# Patient Record
Sex: Female | Born: 1935 | Race: White | Hispanic: No | Smoking: Former smoker
Health system: Southern US, Community
[De-identification: ages and names within clinical notes are randomized; demographics above are authoritative.]

## PROBLEM LIST (undated history)

## (undated) DIAGNOSIS — I2510 Atherosclerotic heart disease of native coronary artery without angina pectoris: Secondary | ICD-10-CM

## (undated) DIAGNOSIS — M1990 Unspecified osteoarthritis, unspecified site: Secondary | ICD-10-CM

## (undated) DIAGNOSIS — I482 Chronic atrial fibrillation, unspecified: Secondary | ICD-10-CM

## (undated) DIAGNOSIS — I272 Pulmonary hypertension, unspecified: Secondary | ICD-10-CM

## (undated) DIAGNOSIS — K219 Gastro-esophageal reflux disease without esophagitis: Secondary | ICD-10-CM

## (undated) DIAGNOSIS — K449 Diaphragmatic hernia without obstruction or gangrene: Secondary | ICD-10-CM

## (undated) DIAGNOSIS — S82899A Other fracture of unspecified lower leg, initial encounter for closed fracture: Secondary | ICD-10-CM

## (undated) DIAGNOSIS — I340 Nonrheumatic mitral (valve) insufficiency: Secondary | ICD-10-CM

## (undated) DIAGNOSIS — I10 Essential (primary) hypertension: Secondary | ICD-10-CM

## (undated) DIAGNOSIS — R9431 Abnormal electrocardiogram [ECG] [EKG]: Secondary | ICD-10-CM

## (undated) DIAGNOSIS — I499 Cardiac arrhythmia, unspecified: Secondary | ICD-10-CM

## (undated) DIAGNOSIS — I071 Rheumatic tricuspid insufficiency: Secondary | ICD-10-CM

## (undated) DIAGNOSIS — F419 Anxiety disorder, unspecified: Secondary | ICD-10-CM

## (undated) DIAGNOSIS — Z9289 Personal history of other medical treatment: Secondary | ICD-10-CM

## (undated) DIAGNOSIS — E785 Hyperlipidemia, unspecified: Secondary | ICD-10-CM

## (undated) DIAGNOSIS — F0390 Unspecified dementia without behavioral disturbance: Secondary | ICD-10-CM

## (undated) DIAGNOSIS — N390 Urinary tract infection, site not specified: Secondary | ICD-10-CM

## (undated) DIAGNOSIS — K2270 Barrett's esophagus without dysplasia: Secondary | ICD-10-CM

## (undated) DIAGNOSIS — E669 Obesity, unspecified: Secondary | ICD-10-CM

## (undated) HISTORY — PX: CORONARY ANGIOPLASTY: SHX604

## (undated) HISTORY — DX: Obesity, unspecified: E66.9

## (undated) HISTORY — PX: BREAST EXCISIONAL BIOPSY: SUR124

## (undated) HISTORY — DX: Pulmonary hypertension, unspecified: I27.20

## (undated) HISTORY — DX: Rheumatic tricuspid insufficiency: I07.1

## (undated) HISTORY — PX: ABDOMINAL HYSTERECTOMY: SHX81

## (undated) HISTORY — DX: Abnormal electrocardiogram (ECG) (EKG): R94.31

## (undated) HISTORY — PX: ESOPHAGOGASTRODUODENOSCOPY: SHX1529

## (undated) HISTORY — DX: Personal history of other medical treatment: Z92.89

## (undated) HISTORY — DX: Diaphragmatic hernia without obstruction or gangrene: K44.9

## (undated) HISTORY — DX: Hyperlipidemia, unspecified: E78.5

## (undated) HISTORY — DX: Nonrheumatic mitral (valve) insufficiency: I34.0

## (undated) HISTORY — DX: Chronic atrial fibrillation, unspecified: I48.20

---

## 1968-02-18 HISTORY — PX: BREAST EXCISIONAL BIOPSY: SUR124

## 1999-02-18 HISTORY — PX: ANKLE SURGERY: SHX546

## 1999-09-09 ENCOUNTER — Encounter: Admission: RE | Admit: 1999-09-09 | Discharge: 1999-09-09 | Payer: Self-pay | Admitting: Emergency Medicine

## 1999-09-09 ENCOUNTER — Encounter: Payer: Self-pay | Admitting: Emergency Medicine

## 2000-12-29 ENCOUNTER — Encounter: Admission: RE | Admit: 2000-12-29 | Discharge: 2000-12-29 | Payer: Self-pay | Admitting: Emergency Medicine

## 2000-12-29 ENCOUNTER — Encounter: Payer: Self-pay | Admitting: Emergency Medicine

## 2002-01-14 ENCOUNTER — Encounter: Admission: RE | Admit: 2002-01-14 | Discharge: 2002-01-14 | Payer: Self-pay | Admitting: Emergency Medicine

## 2002-01-14 ENCOUNTER — Encounter: Payer: Self-pay | Admitting: Emergency Medicine

## 2002-07-15 ENCOUNTER — Encounter: Payer: Self-pay | Admitting: Cardiology

## 2002-07-15 ENCOUNTER — Ambulatory Visit (HOSPITAL_COMMUNITY): Admission: RE | Admit: 2002-07-15 | Discharge: 2002-07-15 | Payer: Self-pay | Admitting: Emergency Medicine

## 2003-01-17 ENCOUNTER — Encounter: Admission: RE | Admit: 2003-01-17 | Discharge: 2003-01-17 | Payer: Self-pay | Admitting: Emergency Medicine

## 2003-03-25 ENCOUNTER — Emergency Department (HOSPITAL_COMMUNITY): Admission: EM | Admit: 2003-03-25 | Discharge: 2003-03-26 | Payer: Self-pay | Admitting: Emergency Medicine

## 2003-05-01 ENCOUNTER — Inpatient Hospital Stay (HOSPITAL_COMMUNITY): Admission: EM | Admit: 2003-05-01 | Discharge: 2003-05-04 | Payer: Self-pay

## 2003-05-02 ENCOUNTER — Encounter (INDEPENDENT_AMBULATORY_CARE_PROVIDER_SITE_OTHER): Payer: Self-pay | Admitting: *Deleted

## 2004-02-15 ENCOUNTER — Encounter: Admission: RE | Admit: 2004-02-15 | Discharge: 2004-02-15 | Payer: Self-pay | Admitting: Emergency Medicine

## 2004-03-23 ENCOUNTER — Ambulatory Visit: Payer: Self-pay | Admitting: Cardiovascular Disease

## 2004-03-24 ENCOUNTER — Inpatient Hospital Stay (HOSPITAL_COMMUNITY): Admission: EM | Admit: 2004-03-24 | Discharge: 2004-03-30 | Payer: Self-pay | Admitting: Emergency Medicine

## 2004-03-25 ENCOUNTER — Encounter: Payer: Self-pay | Admitting: Cardiovascular Disease

## 2004-04-08 ENCOUNTER — Ambulatory Visit: Payer: Self-pay | Admitting: *Deleted

## 2004-07-09 ENCOUNTER — Encounter: Admission: RE | Admit: 2004-07-09 | Discharge: 2004-07-09 | Payer: Self-pay | Admitting: Orthopedic Surgery

## 2004-07-10 ENCOUNTER — Ambulatory Visit (HOSPITAL_BASED_OUTPATIENT_CLINIC_OR_DEPARTMENT_OTHER): Admission: RE | Admit: 2004-07-10 | Discharge: 2004-07-10 | Payer: Self-pay | Admitting: Orthopedic Surgery

## 2004-07-10 ENCOUNTER — Ambulatory Visit (HOSPITAL_COMMUNITY): Admission: RE | Admit: 2004-07-10 | Discharge: 2004-07-10 | Payer: Self-pay | Admitting: Orthopedic Surgery

## 2004-07-10 ENCOUNTER — Encounter (INDEPENDENT_AMBULATORY_CARE_PROVIDER_SITE_OTHER): Payer: Self-pay | Admitting: Specialist

## 2004-10-22 ENCOUNTER — Ambulatory Visit: Payer: Self-pay | Admitting: Cardiovascular Disease

## 2005-03-12 ENCOUNTER — Encounter: Admission: RE | Admit: 2005-03-12 | Discharge: 2005-03-12 | Payer: Self-pay | Admitting: Emergency Medicine

## 2005-05-02 ENCOUNTER — Ambulatory Visit: Payer: Self-pay | Admitting: Cardiovascular Disease

## 2005-05-02 ENCOUNTER — Ambulatory Visit: Payer: Self-pay

## 2005-10-22 ENCOUNTER — Encounter: Payer: Self-pay | Admitting: Cardiovascular Disease

## 2005-10-22 ENCOUNTER — Ambulatory Visit: Payer: Self-pay | Admitting: Internal Medicine

## 2006-03-16 ENCOUNTER — Encounter: Admission: RE | Admit: 2006-03-16 | Discharge: 2006-03-16 | Payer: Self-pay | Admitting: Emergency Medicine

## 2006-05-20 ENCOUNTER — Ambulatory Visit: Payer: Self-pay | Admitting: Cardiovascular Disease

## 2006-06-03 ENCOUNTER — Ambulatory Visit: Payer: Self-pay

## 2006-06-03 LAB — CONVERTED CEMR LAB
ALT: 16 units/L (ref 0–40)
AST: 27 units/L (ref 0–37)
Albumin: 4 g/dL (ref 3.5–5.2)
Total Bilirubin: 0.8 mg/dL (ref 0.3–1.2)
VLDL: 16 mg/dL (ref 0–40)

## 2007-04-14 ENCOUNTER — Encounter: Admission: RE | Admit: 2007-04-14 | Discharge: 2007-04-14 | Payer: Self-pay | Admitting: Emergency Medicine

## 2007-04-21 ENCOUNTER — Encounter: Admission: RE | Admit: 2007-04-21 | Discharge: 2007-04-21 | Payer: Self-pay | Admitting: Emergency Medicine

## 2007-06-11 ENCOUNTER — Encounter: Admission: RE | Admit: 2007-06-11 | Discharge: 2007-06-11 | Payer: Self-pay | Admitting: Emergency Medicine

## 2007-06-22 ENCOUNTER — Encounter: Admission: RE | Admit: 2007-06-22 | Discharge: 2007-06-22 | Payer: Self-pay | Admitting: Emergency Medicine

## 2007-08-02 ENCOUNTER — Encounter: Admission: RE | Admit: 2007-08-02 | Discharge: 2007-08-02 | Payer: Self-pay | Admitting: Emergency Medicine

## 2007-10-26 ENCOUNTER — Encounter: Admission: RE | Admit: 2007-10-26 | Discharge: 2007-10-26 | Payer: Self-pay | Admitting: Emergency Medicine

## 2008-03-07 ENCOUNTER — Ambulatory Visit: Payer: Self-pay | Admitting: Cardiovascular Disease

## 2008-03-07 LAB — CONVERTED CEMR LAB
Basophils Absolute: 0 10*3/uL (ref 0.0–0.1)
Basophils Relative: 0.4 % (ref 0.0–3.0)
CO2: 28 meq/L (ref 19–32)
Calcium: 9.6 mg/dL (ref 8.4–10.5)
Creatinine, Ser: 1.2 mg/dL (ref 0.4–1.2)
Eosinophils Absolute: 0.3 10*3/uL (ref 0.0–0.7)
GFR calc Af Amer: 56 mL/min
HCT: 40.2 % (ref 36.0–46.0)
Hemoglobin: 13.5 g/dL (ref 12.0–15.0)
Lymphocytes Relative: 25.5 % (ref 12.0–46.0)
MCHC: 33.5 g/dL (ref 30.0–36.0)
MCV: 91.6 fL (ref 78.0–100.0)
Monocytes Absolute: 1.2 10*3/uL — ABNORMAL HIGH (ref 0.1–1.0)
Neutro Abs: 5.6 10*3/uL (ref 1.4–7.7)
RBC: 4.39 M/uL (ref 3.87–5.11)
RDW: 12.3 % (ref 11.5–14.6)

## 2008-03-08 ENCOUNTER — Ambulatory Visit (HOSPITAL_COMMUNITY): Admission: RE | Admit: 2008-03-08 | Discharge: 2008-03-08 | Payer: Self-pay | Admitting: Cardiovascular Disease

## 2008-03-09 ENCOUNTER — Inpatient Hospital Stay (HOSPITAL_BASED_OUTPATIENT_CLINIC_OR_DEPARTMENT_OTHER): Admission: RE | Admit: 2008-03-09 | Discharge: 2008-03-09 | Payer: Self-pay | Admitting: Cardiovascular Disease

## 2008-03-09 ENCOUNTER — Ambulatory Visit: Payer: Self-pay | Admitting: Cardiovascular Disease

## 2008-03-23 ENCOUNTER — Ambulatory Visit: Payer: Self-pay | Admitting: Cardiovascular Disease

## 2008-04-04 ENCOUNTER — Ambulatory Visit: Payer: Self-pay | Admitting: Internal Medicine

## 2008-04-04 ENCOUNTER — Encounter: Payer: Self-pay | Admitting: Internal Medicine

## 2008-04-04 DIAGNOSIS — I2510 Atherosclerotic heart disease of native coronary artery without angina pectoris: Secondary | ICD-10-CM | POA: Insufficient documentation

## 2008-04-04 DIAGNOSIS — R55 Syncope and collapse: Secondary | ICD-10-CM

## 2008-04-04 DIAGNOSIS — I10 Essential (primary) hypertension: Secondary | ICD-10-CM

## 2008-04-14 ENCOUNTER — Encounter: Admission: RE | Admit: 2008-04-14 | Discharge: 2008-04-14 | Payer: Self-pay | Admitting: Internal Medicine

## 2008-08-01 ENCOUNTER — Observation Stay (HOSPITAL_COMMUNITY): Admission: EM | Admit: 2008-08-01 | Discharge: 2008-08-02 | Payer: Self-pay | Admitting: Emergency Medicine

## 2008-08-01 ENCOUNTER — Ambulatory Visit: Payer: Self-pay | Admitting: Cardiology

## 2008-08-02 ENCOUNTER — Encounter: Payer: Self-pay | Admitting: Internal Medicine

## 2008-08-02 ENCOUNTER — Encounter: Payer: Self-pay | Admitting: Cardiology

## 2008-08-02 ENCOUNTER — Encounter (INDEPENDENT_AMBULATORY_CARE_PROVIDER_SITE_OTHER): Payer: Self-pay | Admitting: *Deleted

## 2008-08-30 ENCOUNTER — Ambulatory Visit: Payer: Self-pay | Admitting: Internal Medicine

## 2008-08-30 DIAGNOSIS — K219 Gastro-esophageal reflux disease without esophagitis: Secondary | ICD-10-CM

## 2008-08-30 DIAGNOSIS — R079 Chest pain, unspecified: Secondary | ICD-10-CM | POA: Insufficient documentation

## 2008-09-08 ENCOUNTER — Ambulatory Visit: Payer: Self-pay | Admitting: Internal Medicine

## 2008-09-19 ENCOUNTER — Ambulatory Visit: Payer: Self-pay | Admitting: Internal Medicine

## 2008-09-19 ENCOUNTER — Encounter: Payer: Self-pay | Admitting: Internal Medicine

## 2008-09-19 ENCOUNTER — Encounter: Payer: Self-pay | Admitting: Cardiovascular Disease

## 2008-09-22 ENCOUNTER — Encounter: Payer: Self-pay | Admitting: Internal Medicine

## 2008-09-29 ENCOUNTER — Encounter (INDEPENDENT_AMBULATORY_CARE_PROVIDER_SITE_OTHER): Payer: Self-pay | Admitting: *Deleted

## 2008-11-14 ENCOUNTER — Telehealth: Payer: Self-pay | Admitting: Internal Medicine

## 2008-12-29 ENCOUNTER — Encounter (INDEPENDENT_AMBULATORY_CARE_PROVIDER_SITE_OTHER): Payer: Self-pay | Admitting: *Deleted

## 2009-03-14 ENCOUNTER — Ambulatory Visit: Payer: Self-pay | Admitting: Cardiovascular Disease

## 2009-04-17 ENCOUNTER — Encounter: Admission: RE | Admit: 2009-04-17 | Discharge: 2009-04-17 | Payer: Self-pay | Admitting: Internal Medicine

## 2009-08-28 ENCOUNTER — Encounter: Payer: Self-pay | Admitting: Cardiovascular Disease

## 2009-09-11 ENCOUNTER — Ambulatory Visit: Payer: Self-pay | Admitting: Cardiovascular Disease

## 2009-09-11 DIAGNOSIS — E782 Mixed hyperlipidemia: Secondary | ICD-10-CM

## 2010-02-27 ENCOUNTER — Ambulatory Visit: Admission: RE | Admit: 2010-02-27 | Discharge: 2010-02-27 | Payer: Self-pay | Source: Home / Self Care

## 2010-03-21 NOTE — Assessment & Plan Note (Signed)
Summary: F8M/DM   Referring Provider:  Charlton Haws, MD Primary Provider:  Georgianne Fick, MD  CC:  dizziness.  History of Present Illness: [name] is seen today for F/U of CAD and syncope with CRF of HTN.  She has been doing better with less lightheadedness.  She likely has neurally mediated events.  She had a cath 02/2008 and had a widely patent stent to the LAD.  This was placed in 2006.  She has significant arthritis and bruises easily.  Since her stent was patent and 75 years old I think it is safe to D/C her Plavix and just Rx her with ASA.  She uses motrin for her arthritis and this is fine.  She has been compliant with her BP meds.  She denies recurrent syncope, SSCP, dyspnea or edema or palpitations  She just had her lipids checked by Dr Heywood Bene and is not on a statin.    Current Problems (verified): 1)  Chest Pain  (ICD-786.50) 2)  Gerd  (ICD-530.81) 3)  Essential Hypertension, Benign  (ICD-401.1) 4)  Cad  (ICD-414.00) 5)  Syncope and Collapse  (ICD-780.2)  Current Medications (verified): 1)  Nifediac Cc 30 Mg Xr24h-Tab (Nifedipine) .... .qdtab 2)  Metoprolol Tartrate 25 Mg Tabs (Metoprolol Tartrate) .... Take 1 Tablet By Mouth Two Times A Day 3)  Oxybutynin Chloride 5 Mg Tabs (Oxybutynin Chloride) .... Take 1 Tablet By Mouth Two Times A Day 4)  Calcium Carbonate-Vitamin D 600-400 Mg-Unit  Tabs (Calcium Carbonate-Vitamin D) .... Take 1 Tablet By Mouth Once A Day 5)  Vitamin C Cr 500 Mg Cr-Caps (Ascorbic Acid) .... .qdcap 6)  Aspirin Ec 325 Mg Tbec (Aspirin) .... Take One Tablet By Mouth Daily 7)  Vitamin D 1000 Unit Tabs (Cholecalciferol) .... One Tablet By Mouth Once Daily 8)  Vitamin E 400 Unit Caps (Vitamin E) .... One Capsule By Mouth Once Daily 9)  Vitamin B-12 1000 Mcg Tabs (Cyanocobalamin) .... One Tablet By Mouth Once Daily 10)  Omeprazole 20 Mg Cpdr (Omeprazole) .Marland Kitchen.. 1 Capsule By Mouth Once Daily 11)  Motrin Ib 200 Mg Tabs (Ibuprofen) .... As Needed  Allergies  (verified): 1)  ! Ciprofloxacin Hcl (Ciprofloxacin Hcl) 2)  ! Diovan (Valsartan) 3)  ! Lisinopril (Lisinopril) 4)  ! Hydralazine Hcl 5)  ! Oxycodone Hcl (Oxycodone Hcl)  Past History:  Past Medical History: Last updated: 2008/09/09 Hypertension. Paroxysmal atrial fibrillation. Pulmonary hypertension Urinary incontinence. CAD Syncope Anxiety Disorder Arthritis GERD Obesity  Past Surgical History: Last updated: 09/09/2008 Right Dupuytren's contracture Dupuytren's-07/10/04  contracture involving the fifth digit, MCP and PIP joint. Hysterectomy  Family History: Last updated: 2008/09/09 No sudden cardiac death No FH of Colon Cancer: Family History of Liver Disease/Cirrhosis:Dad  Family History of Liver Cancer:Dad  Family History of Diabetes: Mother  Family History of Heart Disease: Mother--MI  Social History: Last updated: 09/09/2008 Tobacco Use - No.  Occupation: Retired Widowed 1 girl Alcohol Use - no Illicit Drug Use - no Patient does not get regular exercise.  Daily Caffeine Use:2-3 cups daily   Review of Systems       Denies fever, malais, weight loss, blurry vision, decreased visual acuity, cough, sputum, SOB, hemoptysis, pleuritic pain, palpitaitons, heartburn, abdominal pain, melena, lower extremity edema, claudication, or rash.   Vital Signs:  Patient profile:   77 year old female Height:      67 inches Weight:      192 pounds BMI:     30.18 Pulse rate:   61 / minute Resp:  12 per minute BP sitting:   148 / 90  (left arm)  Vitals Entered By: Kem Parkinson (September 11, 2009 9:34 AM)  Physical Exam  General:  Affect appropriate Healthy:  appears stated age HEENT: normal Neck supple with no adenopathy JVP normal no bruits no thyromegaly Lungs clear with no wheezing and good diaphragmatic motion Heart:  S1/S2 no murmur,rub, gallop or click PMI normal Abdomen: benighn, BS positve, no tenderness, no AAA no bruit.  No HSM or HJR Distal  pulses intact with no bruits No edema Neuro non-focal Skin warm and dry multipe recent chemical burns by dermatologist for keratotic lesions    Impression & Recommendations:  Problem # 1:  CHEST PAIN (ICD-786.50) Resolved history of CAD patent stent to LAD by cath in 2010 Her updated medication list for this problem includes:    Nifediac Cc 30 Mg Xr24h-tab (Nifedipine) ..... Marland Kitchenqdtab    Metoprolol Tartrate 25 Mg Tabs (Metoprolol tartrate) .Marland Kitchen... Take 1 tablet by mouth two times a day    Aspirin Ec 325 Mg Tbec (Aspirin) .Marland Kitchen... Take one tablet by mouth daily  Problem # 2:  ESSENTIAL HYPERTENSION, BENIGN (ICD-401.1) Well contorlled Her updated medication list for this problem includes:    Nifediac Cc 30 Mg Xr24h-tab (Nifedipine) ..... Marland Kitchenqdtab    Metoprolol Tartrate 25 Mg Tabs (Metoprolol tartrate) .Marland Kitchen... Take 1 tablet by mouth two times a day    Aspirin Ec 325 Mg Tbec (Aspirin) .Marland Kitchen... Take one tablet by mouth daily  Problem # 3:  SYNCOPE AND COLLAPSE (ICD-780.2) Nonrecurrent seem vasovagal.  Continue observation Her updated medication list for this problem includes:    Nifediac Cc 30 Mg Xr24h-tab (Nifedipine) ..... Marland Kitchenqdtab    Metoprolol Tartrate 25 Mg Tabs (Metoprolol tartrate) .Marland Kitchen... Take 1 tablet by mouth two times a day    Aspirin Ec 325 Mg Tbec (Aspirin) .Marland Kitchen... Take one tablet by mouth daily  Problem # 4:  MIXED HYPERLIPIDEMIA (ICD-272.2) I suspect her LDL is not 70 or less.  Will try to get copies from primary.  She is 6 and diet Rx may be all that is needed  Patient Instructions: 1)  Your physician wants you to follow-up in:  6 months.  You will receive a reminder letter in the mail two months in advance. If you don't receive a letter, please call our office to schedule the follow-up appointment.   Echocardiogram Report  Procedure date:  09/11/2009  Findings:      NSR 61 LAD Nonspecific ST/T wave changes

## 2010-03-21 NOTE — Assessment & Plan Note (Signed)
Summary: f77m/jss   Referring Provider:  Charlton Haws, MD Primary Provider:  Georgianne Fick, MD   History of Present Illness: [name] is seen today for F/U of CAD and syncope with CRF of HTN.  She has been doing better with less lightheadedness.  She likely has neurally mediated events.  She had a cath 02/2008 and had a widely patent stent to the LAD.  This was placed in 2006.  She has significant arthritis and bruises easily.  Since her stent was patent and 77 years old I think it is safe to D/C her Plavix and just Rx her with ASA.  She uses motrin for her arthritis and this is fine.  She has been compliant with her BP meds.  She denies recurrent syncope, SSCP, dyspnea or edema or palpitations  Current Problems (verified): 1)  Chest Pain  (ICD-786.50) 2)  Gerd  (ICD-530.81) 3)  Essential Hypertension, Benign  (ICD-401.1) 4)  Cad  (ICD-414.00) 5)  Syncope and Collapse  (ICD-780.2)  Current Medications (verified): 1)  Nifediac Cc 30 Mg Xr24h-Tab (Nifedipine) .... .qdtab 2)  Metoprolol Tartrate 25 Mg Tabs (Metoprolol Tartrate) .... Take 1 Tablet By Mouth Two Times A Day 3)  Oxybutynin Chloride 5 Mg Tabs (Oxybutynin Chloride) .... Take 1 Tablet By Mouth Two Times A Day 4)  Calcium Carbonate-Vitamin D 600-400 Mg-Unit  Tabs (Calcium Carbonate-Vitamin D) .... Take 1 Tablet By Mouth Once A Day 5)  Vitamin C Cr 500 Mg Cr-Caps (Ascorbic Acid) .... .qdcap 6)  Aspirin Ec 325 Mg Tbec (Aspirin) .... Take One Tablet By Mouth Daily 7)  Vitamin D 1000 Unit Tabs (Cholecalciferol) .... One Tablet By Mouth Once Daily 8)  Vitamin E 400 Unit Caps (Vitamin E) .... One Capsule By Mouth Once Daily 9)  Vitamin B-12 1000 Mcg Tabs (Cyanocobalamin) .... One Tablet By Mouth Once Daily 10)  Omeprazole 20 Mg Cpdr (Omeprazole) .Marland Kitchen.. 1 Capsule By Mouth Once Daily 11)  Motrin Ib 200 Mg Tabs (Ibuprofen) .... As Needed  Allergies (verified): 1)  ! Ciprofloxacin Hcl (Ciprofloxacin Hcl) 2)  ! Diovan (Valsartan) 3)  !  Lisinopril (Lisinopril) 4)  ! Hydralazine Hcl 5)  ! Oxycodone Hcl (Oxycodone Hcl)  Past History:  Past Medical History: Last updated: 21-Sep-2008 Hypertension. Paroxysmal atrial fibrillation. Pulmonary hypertension Urinary incontinence. CAD Syncope Anxiety Disorder Arthritis GERD Obesity  Past Surgical History: Last updated: September 21, 2008 Right Dupuytren's contracture Dupuytren's-07/10/04  contracture involving the fifth digit, MCP and PIP joint. Hysterectomy  Family History: Last updated: Sep 21, 2008 No sudden cardiac death No FH of Colon Cancer: Family History of Liver Disease/Cirrhosis:Dad  Family History of Liver Cancer:Dad  Family History of Diabetes: Mother  Family History of Heart Disease: Mother--MI  Social History: Last updated: 21-Sep-2008 Tobacco Use - No.  Occupation: Retired Widowed 1 girl Alcohol Use - no Illicit Drug Use - no Patient does not get regular exercise.  Daily Caffeine Use:2-3 cups daily   Review of Systems       Denies fever, malais, weight loss, blurry vision, decreased visual acuity, cough, sputum, SOB, hemoptysis, pleuritic pain, palpitaitons, heartburn, abdominal pain, melena, lower extremity edema, claudication, or rash. All other systems reviewed and negative  Vital Signs:  Patient profile:   77 year old female Height:      67 inches Weight:      194 pounds BMI:     30.49 Pulse rate:   74 / minute Resp:     12 per minute BP sitting:   130 / 70  (left  arm)  Vitals Entered By: Kem Parkinson (March 14, 2009 10:15 AM)  Physical Exam  General:  Affect appropriate Healthy:  appears stated age HEENT: normal Neck supple with no adenopathy JVP normal no bruits no thyromegaly Lungs clear with no wheezing and good diaphragmatic motion Heart:  S1/S2 no murmur,rub, gallop or click PMI normal Abdomen: benighn, BS positve, no tenderness, no AAA no bruit.  No HSM or HJR Distal pulses intact with no bruits No edema Neuro  non-focal Skin warm and dry    Impression & Recommendations:  Problem # 1:  SYNCOPE AND COLLAPSE (ICD-780.2) Likely neurally mediated.  Rythm and CAD seem stable normal LV function. The following medications were removed from the medication list:    Plavix 75 Mg Tabs (Clopidogrel bisulfate) .Marland Kitchen... Take one tablet by mouth daily Her updated medication list for this problem includes:    Nifediac Cc 30 Mg Xr24h-tab (Nifedipine) ..... Marland Kitchenqdtab    Metoprolol Tartrate 25 Mg Tabs (Metoprolol tartrate) .Marland Kitchen... Take 1 tablet by mouth two times a day    Aspirin Ec 325 Mg Tbec (Aspirin) .Marland Kitchen... Take one tablet by mouth daily  Problem # 2:  CAD (ICD-414.00) D/C Plavix LAD stent patent cath 02/2008 The following medications were removed from the medication list:    Plavix 75 Mg Tabs (Clopidogrel bisulfate) .Marland Kitchen... Take one tablet by mouth daily Her updated medication list for this problem includes:    Nifediac Cc 30 Mg Xr24h-tab (Nifedipine) ..... Marland Kitchenqdtab    Metoprolol Tartrate 25 Mg Tabs (Metoprolol tartrate) .Marland Kitchen... Take 1 tablet by mouth two times a day    Aspirin Ec 325 Mg Tbec (Aspirin) .Marland Kitchen... Take one tablet by mouth daily  Problem # 3:  ESSENTIAL HYPERTENSION, BENIGN (ICD-401.1) Well controlled continue low sodium diet Her updated medication list for this problem includes:    Nifediac Cc 30 Mg Xr24h-tab (Nifedipine) ..... Marland Kitchenqdtab    Metoprolol Tartrate 25 Mg Tabs (Metoprolol tartrate) .Marland Kitchen... Take 1 tablet by mouth two times a day    Aspirin Ec 325 Mg Tbec (Aspirin) .Marland Kitchen... Take one tablet by mouth daily  Patient Instructions: 1)  Your physician recommends that you schedule a follow-up appointment in: 6 MONTHS 2)  Your physician has recommended you make the following change in your medication: STOP PLAVIX

## 2010-03-21 NOTE — Assessment & Plan Note (Signed)
Summary: f66m   Referring Provider:  Charlton Haws, MD Primary Provider:  Georgianne Fick, MD  CC:  sob.  History of Present Illness: [name] is seen today for F/U of CAD and syncope with CRF of HTN.  She has been doing better with less lightheadedness.  She likely has neurally mediated events.  She had a cath 02/2008 and had a widely patent stent to the LAD.  This was placed in 2006.  She has significant arthritis and bruises easily.   Plavix was D/C in July because of easy bruising and stent placement years ago .  She uses motrin for her arthritis and this is fine.  She has been compliant with her BP meds.  She denies recurrent syncope, SSCP, dyspnea or edema or palpitations  She just had her lipids checked by Dr Heywood Bene and is not on a statin.     Current Problems (verified): 1)  Mixed Hyperlipidemia  (ICD-272.2) 2)  Chest Pain  (ICD-786.50) 3)  Gerd  (ICD-530.81) 4)  Essential Hypertension, Benign  (ICD-401.1) 5)  Cad  (ICD-414.00) 6)  Syncope and Collapse  (ICD-780.2)  Current Medications (verified): 1)  Nifediac Cc 30 Mg Xr24h-Tab (Nifedipine) .... .qdtab 2)  Metoprolol Tartrate 25 Mg Tabs (Metoprolol Tartrate) .... Take 1 Tablet By Mouth Two Times A Day 3)  Oxybutynin Chloride 5 Mg Tabs (Oxybutynin Chloride) .... Take 1 Tablet By Mouth Two Times A Day 4)  Calcium Carbonate-Vitamin D 600-400 Mg-Unit  Tabs (Calcium Carbonate-Vitamin D) .... Take 1 Tablet By Mouth Once A Day 5)  Vitamin C Cr 500 Mg Cr-Caps (Ascorbic Acid) .... .qdcap 6)  Aspirin Ec 325 Mg Tbec (Aspirin) .... Take One Tablet By Mouth Daily 7)  Vitamin D 1000 Unit Tabs (Cholecalciferol) .... One Tablet By Mouth Once Daily 8)  Vitamin E 400 Unit Caps (Vitamin E) .... One Capsule By Mouth Once Daily 9)  Vitamin B-12 1000 Mcg Tabs (Cyanocobalamin) .... One Tablet By Mouth Once Daily 10)  Prilosec 20 Mg Cpdr (Omeprazole) .Marland Kitchen.. 1 Tab By Mouth Once Daily 11)  Motrin Ib 200 Mg Tabs (Ibuprofen) .... As Needed  Allergies  (verified): 1)  ! Ciprofloxacin Hcl (Ciprofloxacin Hcl) 2)  ! Diovan (Valsartan) 3)  ! Lisinopril (Lisinopril) 4)  ! Hydralazine Hcl 5)  ! Oxycodone Hcl (Oxycodone Hcl)  Past History:  Past Medical History: Last updated: 09/02/2008 Hypertension. Paroxysmal atrial fibrillation. Pulmonary hypertension Urinary incontinence. CAD Syncope Anxiety Disorder Arthritis GERD Obesity  Past Surgical History: Last updated: 2008/09/02 Right Dupuytren's contracture Dupuytren's-07/10/04  contracture involving the fifth digit, MCP and PIP joint. Hysterectomy  Family History: Last updated: 2008-09-02 No sudden cardiac death No FH of Colon Cancer: Family History of Liver Disease/Cirrhosis:Dad  Family History of Liver Cancer:Dad  Family History of Diabetes: Mother  Family History of Heart Disease: Mother--MI  Social History: Last updated: 09-02-2008 Tobacco Use - No.  Occupation: Retired Widowed 1 girl Alcohol Use - no Illicit Drug Use - no Patient does not get regular exercise.  Daily Caffeine Use:2-3 cups daily   Review of Systems       Denies fever, malais, weight loss, blurry vision, decreased visual acuity, cough, sputum, SOB, hemoptysis, pleuritic pain, palpitaitons, heartburn, abdominal pain, melena, lower extremity edema, claudication, or rash.   Vital Signs:  Patient profile:   77 year old female Height:      67 inches Weight:      192 pounds BMI:     30.18 Pulse rate:   68 / minute Resp:  14 per minute BP sitting:   128 / 80  (left arm)  Vitals Entered By: Kem Parkinson (February 27, 2010 10:19 AM)  Physical Exam  General:  Affect appropriate Healthy:  appears stated age HEENT: normal Neck supple with no adenopathy JVP normal no bruits no thyromegaly Lungs clear with no wheezing and good diaphragmatic motion Heart:  S1/S2 no murmur,rub, gallop or click PMI normal Abdomen: benighn, BS positve, no tenderness, no AAA no bruit.  No HSM or HJR Distal  pulses intact with no bruits No edema Neuro non-focal Skin warm and dry    Impression & Recommendations:  Problem # 1:  MIXED HYPERLIPIDEMIA (ICD-272.2) Needs lab work at primary office.  She will make appt this week CHOL: 194 (06/03/2006)   LDL: 95 (06/03/2006)   HDL: 83.0 (06/03/2006)   TG: 82 (06/03/2006)  Problem # 2:  CHEST PAIN (ICD-786.50) No active angina  CAD stable continue asa and BB Her updated medication list for this problem includes:    Nifediac Cc 30 Mg Xr24h-tab (Nifedipine) ..... Marland Kitchenqdtab    Metoprolol Tartrate 25 Mg Tabs (Metoprolol tartrate) .Marland Kitchen... Take 1 tablet by mouth two times a day    Aspirin Ec 325 Mg Tbec (Aspirin) .Marland Kitchen... Take one tablet by mouth daily  Problem # 3:  ESSENTIAL HYPERTENSION, BENIGN (ICD-401.1) Well controlled Her updated medication list for this problem includes:    Nifediac Cc 30 Mg Xr24h-tab (Nifedipine) ..... Marland Kitchenqdtab    Metoprolol Tartrate 25 Mg Tabs (Metoprolol tartrate) .Marland Kitchen... Take 1 tablet by mouth two times a day    Aspirin Ec 325 Mg Tbec (Aspirin) .Marland Kitchen... Take one tablet by mouth daily  Problem # 4:  SYNCOPE AND COLLAPSE (ICD-780.2) Vasovagal no recent episodes Her updated medication list for this problem includes:    Nifediac Cc 30 Mg Xr24h-tab (Nifedipine) ..... Marland Kitchenqdtab    Metoprolol Tartrate 25 Mg Tabs (Metoprolol tartrate) .Marland Kitchen... Take 1 tablet by mouth two times a day    Aspirin Ec 325 Mg Tbec (Aspirin) .Marland Kitchen... Take one tablet by mouth daily  Patient Instructions: 1)  Your physician recommends that you schedule a follow-up appointment in: 6 MONTHS WITH DR Eden Emms 2)  Your physician recommends that you continue on your current medications as directed. Please refer to the Current Medication list given to you today.

## 2010-04-15 ENCOUNTER — Other Ambulatory Visit: Payer: Self-pay | Admitting: Internal Medicine

## 2010-04-15 DIAGNOSIS — Z1231 Encounter for screening mammogram for malignant neoplasm of breast: Secondary | ICD-10-CM

## 2010-05-14 ENCOUNTER — Ambulatory Visit
Admission: RE | Admit: 2010-05-14 | Discharge: 2010-05-14 | Disposition: A | Discharge status: Home / Self Care | Payer: Medicare Other | Source: Ambulatory Visit | Attending: Internal Medicine | Admitting: Internal Medicine

## 2010-05-14 DIAGNOSIS — Z1231 Encounter for screening mammogram for malignant neoplasm of breast: Secondary | ICD-10-CM

## 2010-05-21 ENCOUNTER — Encounter: Payer: Self-pay | Admitting: Cardiovascular Disease

## 2010-05-27 LAB — CBC
Hemoglobin: 13.8 g/dL (ref 12.0–15.0)
MCHC: 33.7 g/dL (ref 30.0–36.0)
RDW: 13.1 % (ref 11.5–15.5)

## 2010-05-27 LAB — COMPREHENSIVE METABOLIC PANEL
ALT: 13 U/L (ref 0–35)
BUN: 14 mg/dL (ref 6–23)
Calcium: 9.4 mg/dL (ref 8.4–10.5)
Glucose, Bld: 102 mg/dL — ABNORMAL HIGH (ref 70–99)
Sodium: 138 mEq/L (ref 135–145)
Total Protein: 6.8 g/dL (ref 6.0–8.3)

## 2010-05-27 LAB — DIFFERENTIAL
Lymphocytes Relative: 20 % (ref 12–46)
Lymphs Abs: 1.8 10*3/uL (ref 0.7–4.0)
Monocytes Relative: 10 % (ref 3–12)
Neutro Abs: 6.1 10*3/uL (ref 1.7–7.7)
Neutrophils Relative %: 68 % (ref 43–77)

## 2010-05-27 LAB — CARDIAC PANEL(CRET KIN+CKTOT+MB+TROPI)
Relative Index: INVALID (ref 0.0–2.5)
Total CK: 91 U/L (ref 7–177)

## 2010-05-27 LAB — PROTIME-INR
INR: 1 (ref 0.00–1.49)
Prothrombin Time: 13.7 seconds (ref 11.6–15.2)

## 2010-05-27 LAB — POCT CARDIAC MARKERS
CKMB, poc: <1 ng/mL — ABNORMAL LOW (ref 1.0–8.0)
Myoglobin, poc: 70.1 ng/mL (ref 12–200)
Troponin i, poc: <0.05 ng/mL (ref 0.00–0.09)

## 2010-07-02 NOTE — Assessment & Plan Note (Signed)
[facility]                            CARDIOLOGY OFFICE NOTE   MEGGEN, SPAZIANI                    MRN:          [id number]  DATE:03/23/2008                            DOB:          May 12, 1926    [name] returns today for followup.  When I last saw her, she had a  fairly profound syncopal episode at [employer].  She almost broke her wrist,  it is still swollen.  I referred her for heart catheterization.  Her  symptoms were somewhat similar to her previous coronary artery disease,  hence she has had a stent to the LAD.  I believe Dr. Excell Seltzer did a heart  cath and her coronaries were widely patent.  She has good LV function  and no reason to have significant arrhythmias.  However, Lexany  continues to have episodes of lightheadedness and presyncope, I am  somewhat concerned by them.  She really felt quite hard at [employer] and  still has a huge ecchymosis over her right wrist.  It would seem that a  lot of this started when she had Actonel and seemed to get some  significant reflux.  She continues to have what sounds like  presbyesophagus.  I cannot tell if she has GERD and/or reflux or just  swallows a lot of air, but it would appear that a lot of burping and  belching then sets off a chain of events, which leads to vasovagal  syncope.   I told her that I was glad that she was off the Actonel.  She tends to  take Prilosec for reflux, and since she is on chronic Plavix, I think  this should be stopped and switched over to Protonix.   I asked her if she would want to be referred to GI and she currently did  not want to do this and did not want to have an EGD.  Since she  continues to have episodes of presyncope, I think it may be worthwhile  to send her to EP to entertain the idea of a loop recorder.   Again given her normal LV function and the fact that her stent was  widely patent, I think the likelihood of finding a cardiac etiology to  her  presyncope is low.   ALLERGIES:  She is allergic to CIPRO, DIOVAN, LISINOPRIL, HYDRALAZINE,  OXYCODONE.   MEDICATIONS:  1. Plavix 75 a day.  2. Aspirin a day.  3. Nifediac 30 mg a day.  4. Metoprolol 25 b.i.d.  5. Calcium.  6. Protonix 40 a day to be started if she can afford it.   PHYSICAL EXAMINATION:  General:  Remarkable for healthy elderly female  in no distress.  VITAL SIGNS:  Weight is 188, blood pressure 150/70 not postural, pulse  60 and regular, respiratory rate 14, afebrile.  HEENT:  Unremarkable.  Carotids are normal without bruit.  No  lymphadenopathy, thyromegaly, or JVP elevation.  LUNGS:  Clear with good diaphragmatic motion.  No wheezing.  HEART:  S1 and S2.  Normal heart sounds.  PMI normal.  ABDOMEN:  Benign.  Bowel sounds positive.  No AAA, no tenderness, no  bruit, no hepatosplenomegaly, no hepatojugular reflux, no tenderness.  EXTREMITIES:  Distal pulses are intact.  No edema.  NEURO:  Nonfocal.  SKIN:  Warm and dry.  She has a large ecchymosis with a subdermal  hematoma over the right wrist.   IMPRESSION:  1. Coronary artery disease, stent to the left anterior descending      widely patent by recent catheterization.  Continue aspirin, Plavix,      and beta-blocker.  2. Hypertension, currently well controlled.  Continue low-sodium diet,      Nifediac, and metoprolol.  3. Recent syncope with question vasovagal episodes.  Follow up with      Electrophysiology to consider loop recorder.  4. Increased reflux and question presbyesophagus after Actonel      therapy.  This medicine has been stopped since this seems to be a      trigger for her syncopal episodes.  We will continue treatment with      Protonix 40 a day, referral to Gastroenterology when the patient      wishes.   I will see her back in 6 months.     [name], MD, [facility]  Electronically Signed    PCN/MedQ  DD: 03/23/2008  DT: 03/24/2008  Job #: [id number]

## 2010-07-02 NOTE — Cardiovascular Report (Signed)
Sonya, Pope             ACCOUNT NO.:  [id number]   MEDICAL RECORD NO.:  [id number]          PATIENT TYPE:  OIB   LOCATION:  1962                         FACILITY:  [facility]   PHYSICIAN:  Veverly Fells. Excell Seltzer, MD  DATE OF BIRTH:  1927-01-26   DATE OF PROCEDURE:  03/09/2008  DATE OF DISCHARGE:  03/09/2008                            CARDIAC CATHETERIZATION   PROCEDURE:  Left heart catheterization, selective coronary angiography,  and left ventricular angiography.   INDICATIONS:  [name] is a 77 year old woman with known CAD.  She  has had a recent syncopal event.  At the time of her initial  presentation with obstructive CAD, she had syncope in the absence of  chest pain.  She was therefore referred for repeat cardiac  catheterization to rule out myocardial ischemia as a potential cause of  her syncope.  She has had previous stenting of the mid-LAD back in 2006  with a drug-eluting stent.   Risks and indications of the procedure were reviewed with the patient  and informed consent was obtained.  The right groin was prepped, draped  and anesthetized with  1% lidocaine.  Using modified Seldinger  technique, a 4-French sheath was placed in the right femoral artery.  Standard 4-French Judkins catheters were used for coronary angiography  and left ventriculography.  All catheter exchanges were performed over a  guidewire.  The patient tolerated the procedure well.  There were no  immediate complications.   FINDINGS:  Left aortic pressure 147/60 with a mean of 95, left  ventricular pressure 146/21.   CORONARY ANGIOGRAPHY:  Left mainstem:  The left main is angiographically  normal.  It bifurcates into the LAD and left circumflex.   LAD:  The LAD is a large-caliber vessel that reaches the LV apex.  It  supplies three proximal diagonal branches.  The first diagonal is small.  The second is moderate-sized and the third is fairly large.  There is a  widely patent stent in the  midportion of the LAD that has been placed  across the third diagonal branch.  The diagonals are all widely patent.  The proximal LAD before the stent has nonobstructive stenosis in the  range of 30%.  The stent is widely patent with minimal re-stenosis.  The  remaining portions of the LAD have no significant angiographic stenosis.   Left circumflex.  The left circumflex is a large vessel.  There is mild  nonobstructive plaque proximally before the first OM branch.  The OM has  50% proximal stenosis that is unchanged from the previous study.  The  remaining portions of the OM are angiographically normal.  The mid AV  groove circumflex after the OM branch has minor nonobstructive plaque  leading into a second OM branch that is widely patent.   Right coronary artery.  The right coronary artery is a moderate-sized  vessel that supplies a small PDA branch and two small posterolateral  branches.  There was a large acute marginal branch present.  The RCA is  smooth throughout its course with a few areas of very minor irregularity  and no obstructive  coronary artery disease.   Left ventriculography.  LV function is normal.  The LVEF is estimated at  65%.   ASSESSMENT:  1. Widely patent left anterior descending stent with mild      nonobstructive plaque in the left anterior descending.  2. Nonobstructive left circumflex and right coronary artery stenosis.  3. Normal left ventricular function.   PLAN:  Continue current medical therapy.      Veverly Fells. Excell Seltzer, MD  Electronically Signed     MDC/MEDQ  D:  03/09/2008  T:  03/10/2008  Job:  [id number]   cc:   [name], MD, [facility]

## 2010-07-02 NOTE — H&P (Signed)
[name], [name]             ACCOUNT NO.:  [id number]   MEDICAL RECORD NO.:  [id number]          PATIENT TYPE:  OBV   LOCATION:  3731                         FACILITY:  [facility]   PHYSICIAN:  Arturo Morton. Riley Kill, MD, FACCDATE OF BIRTH:  01-22-1927   DATE OF ADMISSION:  08/01/2008  DATE OF DISCHARGE:                              HISTORY & PHYSICAL   PRIMARY CARDIOLOGIST:  Theron Arista C. Eden Emms, MD, [facility]   ELECTROPHYSIOLOGIST:  Doylene Canning. Ladona Ridgel, MD   INTERNIST:  Georgianne Fick, MD   CHIEF COMPLAINT:  Chest pain.   HISTORY OF PRESENT ILLNESS:  [name] is an 77 year old Caucasian  female with known CAD/PE.  Cardiac catheterization with stenting to mid  LAD in 2006, last cardiac cath in January 2010 showing widely patent  stent and nonobstructive disease and continued normal LV function.  The  patient also has a history of vasovagal syncope preceded by indigestion,  belching, and atypical chest pain.  She presents today with the same  symptomatology except for worsened severity and with the addition of  associated numbness and tingling in her lips, upper extremities, and  lower extremities.  The patient was in her usual state of health this  morning when she awoke.  She took her medication with water and drank  some coffee and shortly thereafter began to feel indigestion, increased  sensation of need to belch with belching, as well as atypical chest pain  that was worsening in severity.  She also had associated  lightheadedness, which she has experienced in the past and has learned  to sit down and then lie down with some resolution of her  lightheadedness.  The patient also began to have numbness and tingling  as described above.  She became concerned when this did not resolve with  increased water consumption and presented to the ED for further  evaluation.  When she presented to the ED, she was mildly hypertensive  with normal heart rate, afebrile, and has remained so up until  the time  she was seen by Cardiology.  Currently, she is asymptomatic.   PAST MEDICAL HISTORY:  1. Coronary artery disease/PE.  Cardiac cath and PCI in 2006 with      stent placement to mid LAD, last cath January 2010 with widely      patent stent in LAD and nonobstructive disease, normal LV function.  2. History of vasovagal syncope.  3. Hypertension.  4. History of indigestion (as described above).   SOCIAL HISTORY:  The patient lives in [place] with her family.  She  is retired Visual merchandiser.  She has a very remote minimal smoking  history.  No current EtOH or illicit drug use.   She takes vitamin B, C, D, and calcium.  No other herbal medications.   She has a regular diet, although she tries to avoid foods that elicits  her GI symptoms.  The patient regularly walks for 10-15 minutes several  times a week without this confluence of symptoms.   FAMILY HISTORY:  Noncontributory due to the patient's advanced age.   REVIEW OF SYSTEMS:  Please see HPI.  All other systems reviewed and were  negative.  The patient is a full code.   ALLERGIES:  1. CIPROFLOXACIN  2. DIOVAN.  3. LISINOPRIL.  4. HYDRALAZINE.  5. SIMVASTATIN.  6. FELDENE.  7. DIGOXIN.   MEDICATIONS:  1. Plavix 75 mg p.o. daily.  2. Aspirin 325 mg p.o. daily.  3. Nifedipine 30 mg p.o. daily.  4. Metoprolol 25 mg p.o. b.i.d.  5. Calcium.  6. Protonix 40 mg p.o. daily.  7. Oxybutynin chloride 5 mg p.o. b.i.d. p.r.n.   PHYSICAL EXAMINATION:  VITAL SIGNS:  On admission, blood pressure  107/66, currently 146/63, no significant difference in blood pressure in  left or right arms, pulse 59, respiration rate 15, T-max 97.4, current  temperature 97.0 degrees Fahrenheit, O2 saturation 100% on room air.  GENERAL:  The patient is alert and oriented x3 in no apparent distress.  She is able to speak and move easily without respiratory distress.  HEENT:  Head is normocephalic, atraumatic.  Pupils equal, round, and   reactive light.  Extraocular muscles are intact.  Nares were patent  without discharge.  Dentition (wears dentures).  Oropharynx without  erythema or exudate.  NECK:  Supple without lymphadenopathy.  No thyromegaly and no JVD.  HEART:  Rate is regular.  Audible S1 and S2.  No clicks, rubs, murmurs,  or gallops.  Pulses are 2+ and equal in upper extremities bilaterally  and 1+ and equal in lower extremities bilaterally.  LUNGS:  Clear to auscultation bilaterally.  SKIN:  No rashes, lesions, or petechiae.  The patient does have diffuse  ecchymosis in the distal lower extremities as well as on the lateral  left upper extremity.  ABDOMEN:  Soft, nontender, nondistended.  Normal abdominal bowel sounds.  No rebound or guarding.  No hepatosplenomegaly.  No pulsations.  EXTREMITIES:  No clubbing, cyanosis, or edema.  MUSCULOSKELETAL:  No joint deformity or effusions.  No spinal or CVA  tenderness.  NEURO:  Cranial nerves II through XII are grossly intact.  Strength was  5/5 in all extremities and axial groups.  Normal sensation throughout  and normal cerebellar function.   RADIOLOGY:  The patient had chest x-ray that showed no acute or active  process.   EKG:  EKG showed a rhythm of sinus with a rate of 70, no significant ST-  T ST wave changes, no significant Q-waves, left axis deviation, no  evidence of hypertrophy.  PR 184, QRS 88, and QTC 423.  No significant  changes from tracing completed February 2006.   LABORATORY DATA:  WBC 9.0, HGB 13.8, HCT 41.0, PLT count 239.  Sodium  138, potassium 3.6, chloride 106, CO2 of 24, BUN 14, creatinine 0.91,  glucose 102, AST is 26, ALT 13, albumin is 3.6, calcium is 9.4.  D-dimer  0.32.  Point-of-care markers are negative x2.   ASSESSMENT/PLAN:  This is an 77 year old Caucasian female with known  history of coronary artery disease, but nonobstructive disease with  patent stent on last cath in January 2010 as well as history of  vasovagal syncope  preceded by indigestion/belching/atypical chest pain  presenting with recurrence of symptoms with addition of numbness and  tingling in lips/extremities/lower extremities.   Atypical chest pain - very likely GI related.  The patient has never  been fully worked up with GI.  There is currently no objective evidence  of a cardiac etiology; however, due to the patient's history and  significant risk factors, we will admit to observation and rule  out with  serial cardiac enzymes.  We will also decrease her aspirin from full  strength to 81 mg p.o. daily in case this may be causing aspirin-related  gastritis.  Assuming there is no objective evidence of a cardiac  etiology to the patient's symptoms tomorrow, the patient will be set up  for followup appointment with GI as well as with her primary  cardiologist and electrophysiologist.      Jarrett Ables, [facility]      Arturo Morton. Riley Kill, MD, [facility]  Electronically Signed    MS/MEDQ  D:  08/01/2008  T:  08/02/2008  Job:  [id number]

## 2010-07-02 NOTE — Assessment & Plan Note (Signed)
[facility]                            CARDIOLOGY OFFICE NOTE   [name], [name]                    MRN:          [id number]  DATE:03/07/2008                            DOB:          09-May-1926    This is a re-dictation.   [name] is a 77 year old patient with previous history of coronary  artery disease.  Her last cath was performed by Dr. Riley Kill in 2006.  At  that time she had angioplasty of the LAD and diagonal branches.  This  was in the setting of syncope.   The patient had done fairly well.  She had a Myoview study done on June 03, 2006.  She exercised for 4 minutes and had a normal nuclear study  with an EF of 64%.   Yesterday she was shopping at [place] with her daughter.  She felt  lightheaded and then had a syncopal episode.  She was down for about a  minute.  There was some pallor.  The patient denied chest pain but she  has been getting more episodes of indigestion recently.  This is also  reminiscent of her previous presentation prior to LAD angioplasty.  The  indigestion is not associated with nausea or vomiting.  There was no  associated diarrhea or melena.   The patient's daughter tried to get her to go to the hospital but she  refused.  She called our office make a followup appointment.  She  currently is not having any recurrent syncopal episodes.  At the time  there were no palpitations.  The patient has not had a history of atrial  or ventricular arrhythmias.   REVIEW OF SYSTEMS:  Currently remarkable for significant bruising,  particularly over the right wrist.  She has been compliant with her  medications.  There is been no exertional dyspnea, PND, orthopnea.  No  lower extremity edema.   ALLERGIES:  SHE IS ALLERGIC TO CIPRO, DIOVAN, LISINOPRIL, HYDRALAZINE  AND OXYCODONE.   She is on:  1. Plavix 75 daily.  2. And aspirin.  3. Nifedipine 30 a day.  4. Vitamin C.  5. Metoprolol 25 b.i.d.   FAMILY  HISTORY:  Is noncontributory.   EXAM:  Patient exam is remarkable for an elderly female in no distress.  Affect is appropriate.  Weight is 189, blood pressure is 160/80, pulse  66 and regular, respiratory rate 14, afebrile.  HEENT:  Unremarkable.  Carotids normal without bruit.  NECK: No lymphadenopathy, thyromegaly, JVD.  LUNGS: Clear with good diaphragmatic motion.  No wheezing.  S1, S2.  Normal heart sounds.  PMI normal.  ABDOMEN:  Benign.  Bowel sounds positive.  No AAA, no tenderness, no  bruit, no hepatosplenomegaly or reflux tenderness.  EXTREMITIES:  Pulses intact.  No edema.  NEURO:  Nonfocal.  SKIN:  Warm and dry.  No muscular weakness.  She has a significant  ecchymosis over the right wrist with pain to palpation.   Her electrocardiogram shows a sinus rhythm with left axis deviation, no  acute changes.   IMPRESSION:  1. Significant syncopal episode resulting in right wrist  trauma      similar to presentation when the patient had previous LAD diagonal      disease.  I talked to the patient at length.  Her daughter came      into the room to discuss the workup options.  I think a diagnostic      heart catheterization is in order.  Dr. Excell Seltzer will proceed with      this on Thursday.  He was introduced to the patient.  She will      continue her beta blocker, aspirin, Plavix for the time being.  She      will go immediately to the emergency room for any further episodes      of presyncope or syncope.  2. Hypertension, appears to be fairly well controlled.  Continue      current dose of metoprolol and nifedipine.  I am not sure who      started the patient on nifedipine but I would probably prefer for      her to be on an ACE inhibitor if possible.  However, her allergies      would suggest that someone has tried these and she has been      intolerant to them as well as hydralazine.  3. Significant right wrist sprain in the setting of a fall. When she      gets her  precatheterization chest x-ray we will do this at the      hospital and do a 3-view right wrist x-ray.  It will be important      to make sure there is not a fracture in case she needs further      percutaneous intervention and stents.   Further recommendations will be based on the results of her heart  catheterization.     [name]. Eden Emms, MD, [facility]  Electronically Signed    PCN/MedQ  DD: 03/08/2008  DT: 03/08/2008  Job #: [id number]

## 2010-07-02 NOTE — Discharge Summary (Signed)
[name], [name]             ACCOUNT NO.:  [id number]   MEDICAL RECORD NO.:  [id number]          PATIENT TYPE:  OBV   LOCATION:  3731                         FACILITY:  [facility]   PHYSICIAN:  Verne Carrow, MDDATE OF BIRTH:  12/31/1926   DATE OF ADMISSION:  08/01/2008  DATE OF DISCHARGE:  08/02/2008                               DISCHARGE SUMMARY   PRIMARY CARDIOLOGIST:  Theron Arista C. Eden Emms, MD, [facility]   PRIMARY CARE [name]:  Georgianne Fick, MD   GI followup will be, Wilhemina Bonito. Marina Goodell, MD   DISCHARGE DIAGNOSIS:  Atypical chest pain.   SECONDARY DIAGNOSES:  1. Coronary artery disease status post percutaneous coronary      intervention and stenting of the mid left anterior descending      (coronary artery) in 2006 with patent stent by re-look cath in      January 2010.  2. Hypertension.  3. History of vasovagal syncope.   ALLERGIES:  CIPRO, DIOVAN, LISINOPRIL, HYDRALAZINE, SIMVASTATIN,  FELDENE, DIGOXIN, ACTONEL.   PROCEDURES:  None.   HISTORY OF PRESENT ILLNESS:  An 77 year old Caucasian female with above  problem list.  She was in her usual state of health until day of  admission when she had just swallowed some pills and began to experience  indigestion.  She has a history of indigestion in the past, previously  managed with PPI and subsequently Zantac.  She had not been taking  either recently.  With her indigestion, she began to experience numbness  and tingling in her lips and bilateral extremities, and she also noted  some belching.  She presented to the [facility] ED, where ECG showed no  acute changes and enzymes were negative.  She was admitted for  evaluation.   HOSPITAL COURSE:  The patient was placed on PPI therapy.  She was ruled  out for MI and her D-dimer was normal.  Gallbladder ultrasound was  performed and showed no gallstones or gallbladder wall thickening.  There was normal common bile duct.  She has had no recurrent symptoms.  In the absence of  objective evidence of ischemia, we will not pursue  additional ischemic evaluation.  We have arranged for her to follow up  with Dr. Marina Goodell with [facility] on August 30, 2008, at 10 a.m.  We will  plan to send her home on b.i.d. Zantac, which has previously helped her  symptoms in the past.   DISCHARGE LABORATORY DATA:  Hemoglobin 13.8, hematocrit 41.0, WBC 9.0,  platelets 239.  INR 1.0, D-dimer 0.32.  Sodium 138, potassium 3.6,  chloride 106, CO2 of 24, BUN 14, creatinine 0.91, glucose 102, total  bilirubin 0.8, alkaline phosphatase 48, AST 26, ALT 13, total protein  6.8, albumin 3.6, calcium 9.4, CK 91, MB 1.5, troponin I 0.03.   DISPOSITION:  The patient is being discharged home today in good  condition.   FOLLOWUP PLANS AND APPOINTMENTS:  The patient will follow up with Dr.  Marina Goodell, on August 30, 2008, at 10 a.m., at [facility] and will follow up  with Dr. Eden Emms on September 12, 2008, at 3 p.m.  She  has to follow up with  her primary care Danthony Kendrix as scheduled.   DISCHARGE MEDICATIONS:  1. Aspirin 325 mg daily.  2. Plavix 75 mg daily.  3. Zantac 150 mg b.i.d.  4. Lopressor 25 mg b.i.d.  5. Nifedipine ER 30 mg daily.  6. Oxybutynin 5 mg daily.  7. B12 vitamin daily.  8. Calcium 600 mg daily.  9. Nitroglycerin 0.4 mg sublingual, p.r.n. chest pain.   OUTSTANDING LAB STUDIES:  None.   DURATION OF DISCHARGE/ENCOUNTER:  60 minutes including physician time.      [name], ANP      Verne Carrow, MD  Electronically Signed    CB/MEDQ  D:  08/02/2008  T:  08/03/2008  Job:  [id number]   cc:   Wilhemina Bonito. Marina Goodell, MD  Georgianne Fick, M.D.

## 2010-07-05 NOTE — Procedures (Signed)
HISTORY:  This is a 77 year old patient who is being evaluated for syncopal  events.  Patient is being evaluated for possible seizures.  This is a  routine EEG.  No skull defects noted.   MEDICATIONS:  Aspirin, Tylenol and Senokot.   EEG CLASSIFICATION:  Normal awake.   DESCRIPTION OF RECORDING AND BACKGROUND RHYTHMS:  This recording consists of  a fairly well-modulated, medium-amplitude alpha rhythm of 9 hertz.  There is  ___________ and closure.  As the record progresses, the patient appears to  remain awake and stable throughout the entirety of the recording.  Photic  stimulation is performed resulting in bilateral and symmetric prodrome  response.  Hyperventilation was also performed resulting in minimal spica  rhythm activity without significant slowing seen.  At no time during the  recording did there appear to be spike or spike wave discharges.  EKG  monitor shows no evidence of cardiac arrhythmias with a heart rate of 66.   IMPRESSION:  This is a normal EEG recording in a waking state.  No evidence  of ictal or interval discharges were seen.    Marlan Palau, M.D.   ZOX:WRUE  D:  05/02/2003 17:15:09  T:  05/02/2003 18:12:33  Job #:  [id number]

## 2010-07-05 NOTE — Consult Note (Signed)
NAME:  [name], [name]                       ACCOUNT NO.:  [id number]   MEDICAL RECORD NO.:  [id number]                   PATIENT TYPE:  INP   LOCATION:  3710                                 FACILITY:  [facility]   PHYSICIAN:  [name], M.D.               DATE OF BIRTH:  08/05/1926   DATE OF CONSULTATION:  05/01/2003  DATE OF DISCHARGE:                                   CONSULTATION   A 77 year old right-handed, Caucasian female, admitted on May 01, 2003, to  Dr. Melanee Pope internal medicine service at [facility].   Ms. [name] is a pleasant, fully oriented and alert 77 year old  right-handed Caucasian female with ecchymosis on her right forehead after a  sudden fainting spell and fall.  The patient states that she had three of  those spells, one of presyncope and at least two complete fainting spells  earlier.  Most of the had occurred when she was active in her yard or in her  housework, and she had thought that they might be related to exhaustion.  The last spell, however, happened while she was sitting on the toilet and  was therefore somewhat different in nature.  The patient fell apparently  forward and hit the right forehead on a hard tile floor.  The patient stated  that she has no history of seizure or neurologic diseases and was in  February briefly admission for a cardiology work-up.  Just last week, she  had a cardiac stress test which was negative.   PAST MEDICAL HISTORY:  1. Positive for hypertension.  2. Osteoporosis.  3. Burch incontinence.  4. Syncope spells.   The patient had an unenhanced CT which was negative for bleed after her  fall.  An MRI was obtained last night and showed no acute stroke.   MEDICATIONS:  See list.  Aspirin and Tylenol are the only mentioned  medications.  The patient stated that Dr. Peterson Pope started a cardiac medicine  just about mid February and that she cannot recall the name.   No family history of rhythm  disorders, seizure disorders, coronary artery  disease.   SOCIAL HISTORY:  The patient denies alcohol or drug use.  She is has been a  lifelong nonsmoker.  She is widowed, lives alone.   The patient describes her spell as a fainting spell, this presyncopal  warning, feeling that she could not raise from the toilet bowl and that she  then fainted.  She had no slurring of speech, no confusion afterwards, no  focal weakness or numbness except for the head contusion and the pain she  felt from that.  There was no aphasia, apraxia, or dysarthria, and there is  none now.   NEUROLOGIC EXAMINATION:  Pupils equal, round, and reactive to light and  accommodation.  Full extraocular movements intact.  There is 3 beat  nystagmus with gaze to the left and to the right, horizontal end  point, but  the eye movements are conjugate.  Facial symmetry is preserved.  Facial  sensory is preserved.  Tongue and uvula movement aligned.  Finger-nose test  coordination-wise shows no abnormality.  She has a negative Romberg.  She  states that she cannot perform a tandem gait at baseline at home and often  feels that she is losing her balance when she walks even on an even floor  which is accentuated when she climbs stairs.  It would be beneficial to have  this female, who otherwise has no motor deficits and general strength of  5/5, deep tendon reflex 1+, downgoing toes to plantar stimulation  bilaterally evaluated by physical therapy for balance problems.  Her syncope  work-up for completion should probably contain an EEG, and I went ahead and  ordered this.  I am awaiting the official reading on the MRI and MRA but  could so far see no acute abnormality on the preliminary screens on the  computer.   DIAGNOSIS:  Syncope, 780.2.                                               [name], M.D.    CD/MEDQ  D:  05/02/2003  T:  05/03/2003  Job:  [id number]

## 2010-07-05 NOTE — Discharge Summary (Signed)
NAMESAYANA, SALLEY             ACCOUNT NO.:  [id number]   MEDICAL RECORD NO.:  [id number]          PATIENT TYPE:  INP   LOCATION:  6532                         FACILITY:  [facility]   PHYSICIAN:  Mallory Shirk, MD     DATE OF BIRTH:  August 14, 1926   DATE OF ADMISSION:  03/23/2004  DATE OF DISCHARGE:  03/28/2004                                 DISCHARGE SUMMARY      GDK/MEDQ  D:  03/28/2004  T:  03/28/2004  Job:  [id number]

## 2010-07-05 NOTE — Assessment & Plan Note (Signed)
[name]                              CARDIOLOGY OFFICE NOTE   [name], [name]                    MRN:          [id number]  DATE:10/22/2005                            DOB:          11/29/1926    [name] returns today for followup. She has had previous vasovagal  syncope as well as coronary disease with previous stenting of the LAD in  February 2006 with residual moderate circ disease.  She has not had  significant chest pain, PND orthopnea.   The heat has gotten to her a little bit, however, she feels that she is  doing well.  Her weight is stable.   She was concerned about having [employer] and I explained to her  that [facility] would continue to care for her but she would be charged out of  network rates.   She has been compliant with her aspirin and Plavix.   She apparently had the rash to Simvastatin and was switched to Zetia.  Dr.  Lorenz Coaster is following her lipids.   PHYSICAL EXAMINATION:  GENERAL:  On exam she looks well.  VITAL SIGNS:  Blood pressure 138/86, __________, pulse 64 and regular.  LUNGS:  Clear.  NECK:  Carotids are normal.  HEART:  Normal S1 and S2. Normal heart tones.  ABDOMEN:  Benign.  LOWER EXTREMITIES:  Intact pulses.  No edema.   She had a stress Myoview in March of this year which was nonischemic.   IMPRESSION:  Coronary disease, stent to the left anterior descending with  moderate circumflex disease.  I will see her back in April.  We may need to  do a yearly Myoview on her regarding her previous atypical symptoms with  syncope indicating significant coronary occlusions.   She will follow up with Dr. Lorenz Coaster regarding her Zetia.  At some point in  time it may be worthwhile to re-challenge her with a different Statin to see  if she does not get a rash.  We will call his office to get records of her  LDL.   She will continue her beta blocker.   Overall I am pleased with her progress and I  will see her back in April.                               [name], MD, [facility]    PCN/MedQ  DD:  10/22/2005  DT:  10/22/2005  Job #:  [id number]

## 2010-07-05 NOTE — Assessment & Plan Note (Signed)
[facility]                            CARDIOLOGY OFFICE NOTE   [name], [name]                    MRN:          [id number]  DATE:05/20/2006                            DOB:          1926-07-17    [name] returns today for followup. She has a history of coronary  artery disease with stenting of the LAD, I believe in 2006. Her  presentation was with syncope not chest pain. She had some residual  moderate circumflex disease. She needs a followup Myoview, her last 1 in  March of 2007 was low risk. She did get a drug-eluting stent to the mid  LAD and has been on aspirin and Plavix.   The patient had a rash with a statin drug unfortunately I do not have it  listed here as which one, she has been on Zetia. She needs follow up  lipid and liver profile. We did have a discussion about the safety of  Zetia and I told her I thought it was fine for her to be on it now.  However if her LDL is significantly elevated we may have to rechallenge  her with a different statin drug. She has not had any significant chest  pain.   On exam, her blood pressure is 150/72, pulse 70 and regular.  HEENT: Normal.  LUNGS: Clear.  There is an S1, S2 with normal heart sounds.  ABDOMEN: Benign.  LOWER EXTREMITIES: Intact pulse, no edema.  There is no carotid bruits.  NEURO: Nonfocal.   MEDICATIONS:  Include;  1. Plavix 75 a day.  2. An aspirin a day.  3. Lopressor 50 a day.  4. Zetia 10 a day.   IMPRESSION:  Stable 2 vessel coronary artery disease, follow up Myoview  in the next few weeks, check lipid and liver profile. I suspect we will  try to rechallenge her with a different statin such as Crestor. She will  continue her beta blocker at her current dose.   Further recommendations will be based on the results of her Myoview.     [name], MD, [facility]  Electronically Signed    PCN/MedQ  DD: 05/20/2006  DT: 05/20/2006  Job #: [id number]

## 2010-07-05 NOTE — Op Note (Signed)
NAMELESTA, LIMBERT             ACCOUNT NO.:  [id number]   MEDICAL RECORD NO.:  [id number]          PATIENT TYPE:  AMB   LOCATION:  DSC                          FACILITY:  [facility]   PHYSICIAN:  Artist Pais. Weingold, M.D.DATE OF BIRTH:  1926-11-22   DATE OF PROCEDURE:  07/10/2004  DATE OF DISCHARGE:                                 OPERATIVE REPORT   PREOPERATIVE DIAGNOSES:  Right Dupuytren's contracture Dupuytren's  contracture involving the fifth digit, MCP and PIP joint.   POSTOPERATIVE DIAGNOSES:  Excision of Dupuytren's contracture, release MCP  and PIP.   SURGEON:  Artist Pais. Mina Marble, M.D.   ASSISTANT:  __________   ANESTHESIA:  General.   TOURNIQUET TIME:  50 minutes.   COMPLICATIONS:  None.   DRAINS:  None.   DESCRIPTION OF PROCEDURE:  The patient was taken to the operating room after  the induction of the adequate general anesthesia, right upper extremity was  prepped and draped in the usual sterile fashion. An esmarch was used to  exsanguinate the limb, tourniquet was inflated to 250 mmHg. At this point in  time, a Brunner incision was made over the palmar aspect of the right hand  starting in the mid palm and going to the level of the middle phalanx of the  fifth digit. The incision was taken down through the skin and subcutaneous  tissues, flaps were raised accordingly carefully dissecting off of a  significant diseased cord. The intersection between the disease and normal  fascia and the proximal aspect of the wound was incised and the cord was  drawn distally. The neurovascular bundles were identified carefully and  retracted. The radial nerve aspect of the fifth digit was significantly  drawn in the midline at the level of the MCP to the PIP joint. Careful  dissection was carried out from proximal to distal and distal to proximal at  the level of the proximal phalanx until the neurovascular bundles were  carefully retracted. Once this was done, the entire  Dupuytren's mass was  excised in its entirety. The wound was then thoroughly irrigated, hemostasis  was achieved with bipolar cautery and it was loosely closed with 5-0 nylon.  A sterile dressing of Xeroform 4 x 4 fluffs and a volar splint was applied.  The patient also had a combination of Celestone and Marcaine injected the  wound postop for pain control.     MAW/MEDQ  D:  07/10/2004  T:  07/10/2004  Job:  [id number]

## 2010-07-05 NOTE — H&P (Signed)
NAME:  [name], [name]                       ACCOUNT NO.:  [id number]   MEDICAL RECORD NO.:  [id number]                   PATIENT TYPE:  EMS   LOCATION:  MAJO                                 FACILITY:  [facility]   PHYSICIAN:  Elliot Cousin, M.D.                 DATE OF BIRTH:  1926-06-23   DATE OF ADMISSION:  05/01/2003  DATE OF DISCHARGE:                                HISTORY & PHYSICAL   CHIEF COMPLAINT:  Generalized weakness and fainting.   HISTORY OF PRESENT ILLNESS:  The patient is a 77 year old woman with a past  medical history significant for hypertension and osteoarthritis who  presented to the emergency department with an episode of fainting this  morning.  The patient awoke this morning at approximately 4:45.  She had to  get up to use the bathroom.  When she sat on the toilet she felt weak all of  a sudden.  The next thing she knew she was on the floor.  She stated that  she just passed out and fell on the floor.  She is not quite sure how long  she was passed out. However, when she came to she laid on the floor for a  few minutes before regaining her strength to get up.  When she got up she  ambulated to her bedroom and on the way to the bedroom she passed out again.  This time she hit her right forehead causing a small contusion.  After an  indeterminate amount of time she came to again.  Her granddaughter assisted  her to the bedroom.  She then called her daughter to bring her to the  emergency department.  The patient actually presented to the emergency  department in February of 2005 with generalized weakness and symptoms  consistent with pre-syncope.  Per Dr. Lorenz Coaster, the patient had hypokalemia  with a potassium level of 2.7.  This was associated with an episode of  paroxysmal atrial fibrillation.  She apparently converted in the emergency  department with Cardizem.  She was sent home on Cardizem 180 mg daily and  Digitek.  The patient was evaluated by Dr. Amil Amen a  couple of weeks ago.  Per the patient's history, a Cardiolite stress s test was performed last  week.  The patient was told that her Cardiolite was within normal limits.  Since the Cardiolite two weeks ago the patient says that she has been  feeling weak generally.  She has been feeling fatigued.  She has felt like  she has had no energy.  She also has associated shortness of breath on  exertion.  No pleurisy.  She has had occasional chest tightness but no  diaphoresis, radiation of the pain or tightness. No nausea and no chest  tightness at the time of her passing out spells.  The patient has a history  of syncope in 2004 twice.  Neither time did she seek medical  help.  She felt  that she became overheated and that is what caused her syncope.  She also  felt that one of the episodes of syncope was secondary to a [place] which  she drank.   The two episodes of syncope this morning were not accompanied by any  prodromal symptoms.  There was no associated preceding headache, dizziness,  visual changes, hearing changes, chest pain, shortness of breath,  palpitations or nausea.  She stated she just passed out without warning.  She has had no recent upper respiratory infection symptoms.  She has had no  recent increase or decrease in her weight.  She has had no fever, chills or  headaches lately.  Her appetite has been fair to poor, however.  She has had  no nausea, vomiting or diarrhea.   When the patient was evaluated in the emergency department she was afebrile  with a blood pressure of 148/63, a pulse rate of 77, respiratory rate of 20  and oxygen saturation of 100% on room air.  She was alert and oriented.  The  chest x-ray revealed no acute disease.  A CT scan of the head revealed  atrophy, but no evidence of any acute intracranial abnormality.  The patient  will therefore be admitted for further evaluation and management.   PAST MEDICAL HISTORY:  1. History of syncope twice in  2004.  2. Pre-syncopal episode in February 2005 evaluated in the emergency     department and sent home.  3. History of hypokalemia associated atrial fibrillation which apparently     was short lived and converted in the emergency department with Cardizem.  4. History of a negative Cardiolite stress test in March of 2005.  5. Hypertension.  6. Urinary incontinence.  7. Status post right knee arthroscopic surgery in the past.  8. Status post cesarean section in the past.  9. Status post total abdominal hysterectomy secondary to dysfunctional     uterine bleeding in the past.  10.      Status post left ankle fracture repair with a plate and screws in     the past.  11.      Degenerative joint disease.   MEDICATIONS:  1. Ditropan XL 5 mg daily.  2. Triamterene/hydrochlorothiazide 37.5/25 mg one half tablet daily.  3. Diltiazem ER 180 mg daily.  4. Actonel one tablet per week.   ALLERGIES:  The patient has no known drug allergies.   SOCIAL HISTORY:  The patient is widowed.  She lives in [place], [place] with her daughter and son-in-law.  She has one child and that is  her daughter, Serita Sheller.  She smoked approximately 30 years ago.  She  denies alcohol use.  She can read and write.  She is retired.   FAMILY HISTORY:  Her mother died of a myocardial infarction at 19 years of  age.  Her father died of lung cancer and cirrhosis at 75 years of age.  She  has one brother who has a history of prostate cancer.   REVIEW OF SYMPTOMS:  As above in the history of present illness.  HEENT/GENERAL:  In addition the patient denies spinning dizziness, fever,  chills, night sweats,  headache except for the headache today associated  with the fall.  No changes in her hearing or vision.  No difficulty  swallowing.  NEUROLOGIC:  No focal weakness.  No history of seizure  activity.  CARDIOPULMONARY:  No history of pleurisy.  GI:  No history of abdominal pain, nausea, vomiting or  diarrhea.  No constipation, melena or  hematochezia.  GU:  No pain with urination.  The patient's review of systems  is positive for fatigue, occasional chest tightness, occasional cough which  has been dry and intermittent, occasional sinus pressure, occasional  swelling in her ankles.   PHYSICAL EXAMINATION:  VITAL SIGNS:  Temperature is 98.1, blood pressure  125/61, pulse 71, respiratory rate 14, oxygen saturation is 97% on room air.  GENERAL:  The patient is an alert, pleasant 77 year old Caucasian lady who  is currently lying in bed in no acute distress.  HEENT:  Head is normocephalic, however, she does have mild swelling of her  right lateral forehead.  There is also an area of ecchymosis of the right  lateral forehead. There is mild tenderness to palpation of this area.  Pupils equal, round and reactive to light.  Extraocular movements are  intact.  Conjunctivae are clear.  Sclerae are white.  There is no proptosis  or ptosis noted.  Tympanic membranes are clear bilaterally.  Nasal mucosa is  moist with no drainage.  The oropharynx reveals a full set of dentures.  Mucous membranes are pulse.  No posterior exudates or erythema.  The neck is  supple.  No adenopathy and no thyromegaly.  No bruit.  No JVD.  LUNGS:  Completely clear to auscultation bilaterally.  HEART:  S1, S2 with no murmurs, rubs or gallops.  ABDOMEN:  There is a well healed hypogastric scar.  Her abdomen is mildly  obese.  Bowel sounds are present.  She is mildly tender in the right lower  quadrant.  No masses are palpated. There is no hepatosplenomegaly.  There is  no rebound and no guarding.  She does have a Foley catheter indwelling.  There is clear yellow urine in the Foley bag.  RECTAL:  The patient has a small non-bleeding external hemorrhoid and a skin  tag.  There were no masses palpated in the rectal vault.  There was a scant  amount of light brown stool in the rectal vault which was guaiac negative.   Rectal tone was good.  EXTREMITIES:  The patient had arthritic changes of both of her knees.  She  had diffuse fungal toenails bilaterally.  There was no pretibial edema and  no pedal edema.  Pedal pulse on the left was 2+ and pedal pulse on the right  lower extremity could not be detectable.  The patient also had some  tenderness of her lumbosacral muscles, but there was no ecchymosis or  swelling associated with it.  She was able to move all of her extremities  with a good range of motion.  She did have some mild crepitus in both of her  knees bilaterally.  NEUROLOGIC:  The patient is alert and oriented times three.  Cranial nerves  II-XII are intact.  Strength sacroiliac 5/5 throughout.  Sensation is intact  to soft touch.  Cerebellar is intact with finger to nose bilaterally.  There  is no pronator drift.   LABORATORY DATA:  CT scan of the head:  The preliminary finding was that of  atrophy, but no evidence of acute intracranial abnormality.  A chest x-ray shows no acute disease.  EKG shows normal sinus rhythm, nonspecific ST and T  wave abnormalities.   Sodium 135, potassium 3.7, chloride 102, BUN 11, glucose 102, bicarbonate  22.9 and creatinine 1.2.  Hematocrit 41.0 and hemoglobin 13.9. Alcohol level  is less  than 5.0.  Urinalysis is negative except for greater than 80 mg of  ketones.   ASSESSMENT:  1. Recurrent syncope.  The etiology of the patient's syncope is unknown at     this time.  The differential diagnoses include an arrhythmia, seizure,     electrolyte abnormality, orthostasis, syncope associated with micturition     and psychoneurogenic.  The patient recently had a Cardiolite stress test     performed per Dr. Amil Amen.  The official results are unknown at this     time, but per patient history the Cardiolite was negative.  2. Status post fall secondary to syncope with resultant right lateral     forehead contusion. The CT scan of the head was negative for any acute      intracranial abnormality.  The patient, however, does have a small     contusion of her right and left forehead secondary to the fall.  3. Low back pain.  The patient has chronic degenerative joint disease,     however, she does have some acute on chronic low back pain secondary to     the fall.  4. History of hypokalemia associated with atrial fibrillation.  The     emergency department records from February are not available at this     time.  However, per Dr. Melanee Spry history the patient presented to the     emergency department with a pre-syncopal episode.  She was found to have     a short episode of atrial fibrillation and a potassium of 2.7.  Dr.     Lorenz Coaster changed the patient's antihypertensive medication from     hydrochlorothiazide to Maxzide.   PLAN:  1. The patient will be admitted to a telemetry bed.  2. Cardiac enzymes will be collected.  3. We will consult Dr. Amil Amen, the patient's cardiologist.  4. We will obtain orthostatic heart rate and blood pressure q.a.m. times     three.  5. Neuro checks q.4h times 24 hours.  6. We will need to assess the patient's CBC and C-MET now.  We will also     need to assess the patient's TSH, free T4, magnesium, phosphorus and     vitamin B12.  7. We will also obtain an MRI of the brain as well as an MRA of the head and     neck, carotid Dopplers and a 2D echocardiogram.  8. We will consider obtaining a neurology consult.  The patient may also     need an electrophysiologic evaluation.  9. We will hold the patient's antihypertensive medications for now and we     will give the patient gentle volume repletion with normal saline at 60 mL     an hour.                                                Elliot Cousin, M.D.    DF/MEDQ  D:  05/01/2003  T:  05/01/2003  Job:  [id number]   cc:   Reuben Likes, M.D.  [address]  [place]  [place] [address]  Fax: [phone]   Francisca December, M.D.  [address]  [address]   [place]  [place] [address]  Fax: [phone]

## 2010-07-05 NOTE — Consult Note (Signed)
[name], [name]             ACCOUNT NO.:  [id number]   MEDICAL RECORD NO.:  [id number]          PATIENT TYPE:  INP   LOCATION:  3703                         FACILITY:  [facility]   PHYSICIAN:  Charlton Haws, M.D.     DATE OF BIRTH:  [birth year]   DATE OF CONSULTATION:  DATE OF DISCHARGE:                                   CONSULTATION   CARDIOLOGY CONSULT NOTE   CHIEF COMPLAINT:  Syncope associated with chest pain.   HISTORY OF PRESENT ILLNESS:  This is a 77 year old female, admitted to [facility] March 23, 2004, by the [facility] hospitalist.  We were  asked to see the patient in consultation today for further cardiac  evaluation.  The patient has been seen in the past by Dr. Corliss Marcus as  well as by Dr. Mayford Knife to evaluate her syncope.  Apparently, she had a  negative Cardiolite stress test performed in March 2005.  She also wore what  we believe was an event monitor that apparently was unrevealing.   The patient has had at least two syncopal episodes with multiple episodes of  presyncope.  She has had a previous echo that revealed an ejection fraction  of 50-55%.  She has a repeat echo pending this admission.   The patient states that on Saturday, she was in the kitchen preparing a meal  when she developed severe indigestion-type chest pain.  She felt  presyncopal.  She felt hot and clammy and mildly short of breath.  She sat  down in a chair, which she has done in the past to abort syncope; however,  she subsequently passed out.  She was found by her daughter, who called EMS,  and the patient was brought to the emergency room and admitted.   PAST MEDICAL HISTORY:  As noted, the patient has a previous history of  syncope.  She was evaluated by Dr. Armanda Magic as well as by Dr. Amil Amen in  the past.  She has had at least two episodes of syncope with 3-4 episodes of  presyncope.  She has had an MRI/MRA of the brain that showed nothing acute.  She has a history of  hypertension and paroxysmal atrial fibrillation.  She  has not been anticoagulated, although she does take aspirin.  She has a  history of pulmonary artery hypertension, history of urinary incontinence  and history of osteoarthritis.  She has had multiple surgeries.  She had a  neuro evaluation in March 2005.  We do not have the results of that  evaluation.   ALLERGIES:  No known drug allergies.   CURRENT MEDICATIONS:  1.  Multivitamin daily.  2.  IV heparin.  3.  Hydrochlorothiazide 25 mg daily.  4.  Cozaar 100 mg daily.  5.  Toprol XL 25 mg daily.  6.  Ditropan 5 mg daily.  7.  Protonix 40 mg daily.  8.  Aspirin daily prior to admission.   SOCIAL HISTORY:  The patient lives in [place] with her daughter.  She has  only one child.  She does not use alcohol or tobacco.  She quit smoking at  least 30 years ago.  She is retired.   FAMILY HISTORY:  Her mother died at age 18 from an MI.  Her father died from  lung CA at age 33.  She has a brother who has prostate CA.   REVIEW OF SYSTEMS:  Completely negative except for the following.  She has  had some night sweats.  She has occasional blurred vision.  She has had  chest pain but only associated with syncope or presyncope.  She has had  arthralgias.  She has cold intolerance.  The remainder of the review of  systems is negative.   PHYSICAL EXAMINATION:  VITAL SIGNS:  Blood pressure lying was 146/44,  sitting 188/64, standing 164/76.  Pulses were 59, 63 and 66.  Respirations  20, temperature 98.5.  GENERAL:  This is a pleasant 77 year old white female in no acute distress.  HEENT:  Unremarkable.  NECK:  Reveals no bruits and no jugular venous distention.  HEART:  Reveals regular rate and rhythm without murmur or ectopic.  LUNGS:  Clear.  ABDOMEN:  Obese, soft and nontender.  EXTREMITIES:  Reveal pulses to be intact with trace edema.  SKIN:  Warm and dry.   LABORATORY DATA:  Chest x-ray shows increased bronchitic changes with  right  basilar atelectasis.  An EKG shows normal sinus rhythm, rate 60 beats per  minute, with nonspecific changes.  Telemetry shows sinus rhythm, rate 50s-  70s, with occasional PACs.  A magnesium level was 1.9.  BUN 10, creatinine  0.8, potassium 3.8, glucose 93.  CBC revealed hemoglobin 11.8, hematocrit  34.1, WBC 7.5, platelets 272,000.  Cardiac enzymes were negative x 3.  Potassium on admission was 3.3.  Stool guaiac is negative.  PTT was 27, INR  0.9.   IMPRESSION:  1.  Syncope, with previous multiple episodes of presyncope and one other      episode of syncope.  These episodes have been associated with chest      pain.  2.  History of paroxysmal atrial fibrillation, not on Coumadin in the past.  3.  History of hypertension.  4.  History of pulmonary hypertension.  5.  Cardiolite in March 2005 reportedly negative.  6.  2-D echocardiogram performed May 02, 2003, revealing ejection fraction      of 50-55%.  7.  Hypokalemia this admission and previous hypokalemia, probably related to      hydrochlorothiazide.  8.  History of MRI/MRA of the brain negative in March 2005.  9.  Repeat echocardiogram pending.  10. Orthostatic blood pressures revealing no significant drop in blood      pressure.   PLAN:  We will check a D-dimer.  We will schedule the patient for a cardiac  catheterization as well as an EP eval for possible loop recorder implant.  We will repeat her basic metabolic panel to be sure that her potassium is  still within normal range.      DR/MEDQ  D:  03/25/2004  T:  03/25/2004  Job:  [id number]   cc:   Reuben Likes, M.D.  [address]  [place]  [place] [address]  Fax: [phone]
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
## 2010-07-05 NOTE — Discharge Summary (Signed)
[name], [name]             ACCOUNT NO.:  [id number]   MEDICAL RECORD NO.:  [id number]          PATIENT TYPE:  INP   LOCATION:  6532                         FACILITY:  [facility]   PHYSICIAN:  Mallory Shirk, MD     DATE OF BIRTH:  Feb 29, 1926   DATE OF ADMISSION:  03/23/2004  DATE OF DISCHARGE:  03/28/2004                                 DISCHARGE SUMMARY   DISCHARGE DIAGNOSIS:  1.  Chest pain with left anterior descending  and circumflex disease status      post percutaneous transluminal coronary angiography.  2.  Syncopal episode.  3.  Hypertension.  4.  Paroxysmal atrial fibrillation.   DISCHARGE MEDICATIONS:  To be determined.   FOLLOW UP:  Follow up with Dr. Lorenz Coaster, primary care physician, within one  week of discharge.  Follow up with cardiology as determined at discharge.   HISTORY OF PRESENT ILLNESS:  [name] is a very pleasant 78 year old  Caucasian woman with a past medical history significant for hypertension,  paroxysmal atrial fibrillation, pulmonary hypertension, with multiple  syncopal episode in the past presented with recurrent syncopal episode on  March 23, 2004.  The patient states that she recognized some chest  fullness, chest tightness, and light headedness  after standing up and  attempting to make dinner.  She was on her feet for about 5-10 minutes when  she felt a spell coming.  At that time, she sat down, put her head on the  table, and was found to be unresponsive by the granddaughter who called 911.  The patient's loss of consciousness was estimated at between 10 and 15  minutes duration.  No postictal symptoms.  The patient states similar  episodes in the past, however, they were not this prolonged in duration.  No  nausea and vomiting, no recent history of abdominal pain, constipation, or  diarrhea.  The patient states that the chest tightness is more of a fullness  and pain in the epigastric region which improved on belching.  She still  had  chest tightness upon exam in the ED.  No diaphoresis or palpitations noted.   PAST MEDICAL HISTORY:  1.  Hypertension.  2.  PAF.  3.  Pulmonary hypertension.  4.  Urinary incontinence.   MEDICATIONS ON ADMISSION:  1.  Multi-vitamin daily.  2.  Metoprolol 25 mg p.o. daily.  3.  Hydrochlorothiazide 12.5 mg p.o. daily.  4.  Cozaar 100 mg p.o. daily.  5.  Ditropan 5 mg p.o. daily.  6.  Aspirin daily.   PHYSICAL EXAMINATION:  On admission, blood pressure 112/58, pulse 122,  saturation 95% on room air.  General:  Elderly Caucasian woman in no acute  distress.  HEENT:  Normocephalic, atraumatic, PERRLA, sclerae anicteric,  mucous membranes moist.  Neck supple, no JVD.  Cardiovascular:  Irregular  rate and rhythm, no murmurs, gallops, and rubs.  Chest clear to auscultation  bilaterally with good air movement.  Abdomen:  Soft, positive bowel sounds,  no tenderness, no masses.  Extremities:  No cyanosis, clubbing, and edema.  Neurologic exam nonfocal.   LABORATORY DATA:  WBC  12.7, hemoglobin 13.2, hematocrit 36.9, platelets 267.  Protime 12.3, INR 0.9, PTT 27.  Point of care cardiac enzymes negative.  Sodium 138, potassium 3.4, chloride 103, bicarb 24, glucose 114, BUN 11,  creatinine 0.8, calcium 9.3, total protein 6.5, albumin 3.6.  LFTs within  normal limits.  EKG showed atrial fibrillation.  The patient also went into  rapid ventricular rate at about 120 to 130 with ST depression changes noted.  Chest x-ray negative for any acute infiltrative process.  CT of the head  negative for any acute intracranial process.   HOSPITAL COURSE:  The patient was admitted to a monitored bed.   Problem 1:  Chest pain.  Cardiac enzymes were negative.  The patient was  seen by Dr. Amil Amen before and had a significant cardiac workup, most of  which was normal.  The patient was seen by Dr. Eden Emms of cardiology.  A  cardiac catheterization shows LAD disease.  The patient subsequently  underwent  PTCA on March 28, 2004.  The patient tolerated the procedure  well.  She will be discharged on medications as determined by Dr. Eden Emms.   Problem 2:  Syncopal episode.  EP consult was requested since Dr. Amil Amen  last evaluation, the plan was to have EPS review the patient if had another  syncopal episode.  The plan is to put in a loop recorder at a point to be  determined by Dr. Graciela Husbands.  A 2D echo on March 25, 2004, showed overall left  ventricular systolic function to be normal with an LVEF estimated at 55-65%.  No left ventricular regional wall motion abnormalities.  Aortic valve mildly  calcified with mild aortic root dilatation noted.   Problem 3:  Hypertension.  The patient's blood pressure was consistently  elevated during the first part of her hospital stay.  HCTZ was increased to  25 mg p.o. daily.  However, her blood pressure continued to stay high with  systolic in the 160s.  Since she was on the maximum recommended dose of  Cozaar, Hydralazine was added at 10 mg p.o. t.i.d.  Subsequently, the  patient's blood pressure was better controlled.  At the time of this  dictation, the patient's blood pressure is 127/61.   The patient will be discharged in stable condition with follow up  appointments with cardiology and primary care physician to be determined at  the time of discharge.      GDK/MEDQ  D:  03/28/2004  T:  03/28/2004  Job:  [id number]   cc:   Reuben Likes, M.D.  [address]  [place]  [place] [address]  Fax: [phone]   Francisca December, M.D.  [address]  [place]  [place] [address]  Fax: [phone]   Duke Salvia, M.D.   Charlton Haws, M.D.

## 2010-07-05 NOTE — Cardiovascular Report (Signed)
[name], [name]             ACCOUNT NO.:  [id number]   MEDICAL RECORD NO.:  [id number]          PATIENT TYPE:  INP   LOCATION:  3710                         FACILITY:  [facility]   PHYSICIAN:  Arturo Morton. Riley Kill, M.D. [facility] OF BIRTH:  05-19-1926   DATE OF PROCEDURE:  03/28/2004  DATE OF DISCHARGE:                              CARDIAC CATHETERIZATION   INDICATIONS:  Ms. Koltz is a delightful 77 year old who has had admission  to the hospital for syncope. With this, she had some indigestion and/or  chest pain. She has had a long or a complicated history of syncope over the  past year or two. With this, she was seen subsequently by EP,  she underwent  diagnostic cardiac catheterization which demonstrated a 70-80% stenosis in  the LAD. Associated with this, she also had some disease in the circumflex  marginal. The films were reviewed with Dr. Eden Emms who felt that percutaneous  intervention of the LAD was warranted. Based upon this, EP recommended no  specific therapy other than measures for neurally mediated syncope. Because  of the worrisome appearance of the LAD, Dr. Eden Emms felt that PCI was  warranted to the LAD. We were in agreement. I discussed the risks, benefits  and alternatives with the patient which included but are not limited to the  issue of side branch closure. She was brought to the catheterization  laboratory for further treatment.   PROCEDURE:  Percutaneous stenting of the left anterior descending artery  with kissing balloon dilatation of the third diagonal.   DESCRIPTION OF PROCEDURE:  The patient was brought to the cath lab and  prepped and draped in usual fashion. Using a Smart needle left femoral  artery was entered. Through this, a 6 French sheath was placed. __________  was given according to protocol. A JL-35 guiding catheter was utilized and  multiple views were obtained to lay this area out well. The lesion was  crossed with a high-torque floppy wire.  Our initial strategy was to do a  cutting balloon angioplasty to see if an adequate result could be achieved.  With this, we were unable to successfully achieve a good result. A 2.5  Cypher stent was then placed across the lesion and dilatation done up to  about 13 atmospheres with a 2.5 stent. With this, mild narrowing of the  diagonal became somewhat more significant. I went ahead with a second wire  which was a traverse wire and passed this down the diagonal. Following this,  post dilatation of the stent was done with a 2.5 mm Quantum Maverick balloon  with higher pressures in the distal portion of the stent and slightly lower  pressures cross the area of the diagonal dilatation.  With the short balloon  then left in place, we placed a second balloon down into the diagonal and  simultaneous dilatations were done. With this, there was marked improvement  in the appearance of the diagonal. All catheters were subsequently removed  and the femoral sheath was sewn into place. Final angiographic results were  excellent.   ANGIOGRAPHIC DATA:  1.  The left main coronary artery  is free of critical disease.  2.  The left anterior descending artery has three diagonals. The first two      diagonals are really quite small. The third diagonal takes off just      prior to the lesion and has probably about 40% narrowing. This looked      like it was slightly worse following nitro related dilatation of the      diagonal branch. We estimated this at 40-50%. The LAD itself appeared to      be about 80% Luminal reduction. Following stenting and balloon      dilatation, the 75-80% area of narrowing was reduced to 0% with the      stent widely patent. The diagonal which was 50% at the beginning of the      case was about 50% after the completion of the case and kissing balloon      dilatation.   The circumflex marginal as previously noted has moderate disease, but did  not appear to be any worse on the  current study.   CONCLUSION:  1.  Successful percutaneous stenting of the mid left anterior descending      artery with a reduction in stenosis from 80 to 0%.      TDS/MEDQ  D:  03/28/2004  T:  03/28/2004  Job:  [id number]   cc:   Charlton Haws, M.D.   [facility]   Duke Salvia, M.D.

## 2010-07-05 NOTE — H&P (Signed)
[name], [name]             ACCOUNT NO.:  [id number]   MEDICAL RECORD NO.:  [id number]          PATIENT TYPE:  EMS   LOCATION:  MAJO                         FACILITY:  [facility]   PHYSICIAN:  Gertha Calkin, M.D.DATE OF BIRTH:  Aug 27, 1926   DATE OF ADMISSION:  03/23/2004  DATE OF DISCHARGE:                                HISTORY & PHYSICAL   PRIMARY CARE PHYSICIAN:  Dr. Lorenz Coaster.   CARDIOLOGIST:  Dr. Amil Amen.   CHIEF COMPLAINT:  Syncopal episode.   HISTORY OF PRESENT ILLNESS:  This is a pleasant 77 year old Caucasian female  with a past medical history significant for hypertension, paroxysmal atrial  fibrillation, pulmonary artery hypertension, urinary incontinence with  multiple syncopal episodes in the past who presents with a recurrent episode  earlier in the afternoon. The patient states he recognized some chest  fullness and lightheadedness after standing and attempting to make dinner  for her granddaughter. She states that she was on her feet for approximately  five to ten minutes when she felt a spell coming on. At that time she sat  down, put her head on the table, and then was found to be unresponsive by  the granddaughter who then immediately called her mother. At the time that  the patient's daughter arrived the patient was unresponsive for  approximately 10 to 15 minutes and then slowly regained consciousness. She  denies any postictal symptoms. She states that previous episodes were  similar, but not as prolonged in duration. Denies any nausea or vomiting. No  recent history of constipation or diarrhea. No abdominal pain. Upon  questioning the chest tightness she states that it is more of a fullness and  a sharp pain in the epigastric area which improved with belching. Currently  she states she still has ongoing chest tightness. No shortness of breath,  diaphoresis, palpitations, or lightheadedness. Previous episodes she  described as one episode was after  standing from a seated position and then  another episode was during urination. She has had a rather significant  workup in her past admission which was in March 2005 including cardiology  input, negative MRI and MRA, negative Dopplers, negative 2-D echo which  revealed lower limits of normal LV function at 50% to 55%. There was  increased right ventricular systolic pressure which led to the diagnosis of  pulmonary artery hypertension. At the time of her PAF it was recommended to  do treatment purely with beta blockers and no anticoagulation was  recommended per cardiology. She had a negative Cardiolite stress test prior  to that hospitalization. At that time the patient was recommended follow-up  in an outpatient clinic with cardiology; they recommended that if further  episodes occur she needs to have an EP evaluation. The patient states that  she went home on a loop recorder, but per patient negative findings when  seen in the outpatient clinic.   PAST MEDICAL HISTORY:  1.  Hypertension.  2.  Paroxysmal atrial fibrillation.  3.  Pulmonary hypertension.  4.  Urinary incontinence.   MEDICATIONS:  1.  Multivitamin daily.  2.  Metoprolol 25 mg daily.  3.  Hydrochlorothiazide 12.5 mg daily.  4.  Cozaar 100 mg daily.  5.  Ditropan 5 mg daily.  6.  Aspirin daily.   FAMILY HISTORY:  Negative for early onset coronary artery disease, strokes,  or cancers. No blood clots are noted in the family.   SOCIAL HISTORY:  She lives in [place] with her daughter and her family.  She denies any tobacco, alcohol, or IV drug abuse.   REVIEW OF SYSTEMS:  Essentially negative. Denies hemoptysis, hematemesis,  hematochezia, or melenic stools. No abdominal pain, urinary symptoms. Denies  any recent sick contacts.   PHYSICAL EXAMINATION:  VITAL SIGNS: Temperature 96.4, blood pressure 112/58,  pulse 122, she is 95% on room air saturation.  GENERAL: She is without distress, lying in bed.  HEENT:  Unremarkable.  CARDIOVASCULAR: Irregular rate and rhythm. No murmurs, rubs, or gallops.  CHEST: Clear to auscultation bilaterally with good air movement.  ABDOMEN: Soft, nontender, nondistended. Bowel sounds present.  EXTREMITIES: Without edema. There is no JVP. Pulses are intact and symmetric  bilaterally, upper and lower.  NEUROLOGIC: She has no cranial deficits. No sensation deficits. Strength is  intact 5/5 bilaterally upper and lower extremities.   Laboratories reveal white count of 12.7, hemoglobin 13.2, hematocrit 36.9,  platelet count 267,000. She has an absolute granulocyte of 9.4 which is  elevated, RDW of 12.8 which is normal. Her PT is 12.3, INR 0.9, PTT 27.  Point of care markers times two is negative. CMET showed sodium of 138,  potassium 3.4, chloride 103, bicarbonate 24, glucose 114, BUN 11, creatinine  0.8, calcium 9.3, total protein 6.5, albumin 3.6. AST, ALT, alkaline  phosphatase, and bilirubin are all normal.   EKG shows atrial fibrillation at a rate of 61. She went into a rapid  ventricular rate at around 120 to 130s at which point EKG changes were most  notable for lateral ST depression. Chest x-ray was negative for any acute  infiltrative process. CT of the head was negative for any acute process.   ASSESSMENT/PLAN:  1.  Syncopal episode.  2.  Hypertension.  3.  Paroxysmal atrial fibrillation.  4.  Pulmonary artery hypertension.  5.  Urinary incontinence.   PLAN:  Check a rectal exam, check orthostatics in the ED. If rectal exam is  negative will start her empirically on anticoagulation for possible  underlying cardiac ischemia secondary to her rate, but maybe revealing  possible ischemic heart disease with her increased rates. Will rule out with  enzymes, place her on telemetry bed, check a 2-D echo for any LV wall motion  abnormalities. If her orthostatics are positive, will give her some fluids.  Otherwise, will hold off. Will follow up in the morning  with labs and EKG. Also will  need to ask cardiology to come by and see in the morning to continue their  workup from previous admission. If she has positive enzymes will expect a  catheterization to be done to rule out ongoing active ischemic disease prior  to any other studies being done.      JD/MEDQ  D:  03/24/2004  T:  03/24/2004  Job:  [id number]   cc:   Ollen Gross. Vernell Morgans, M.D.  [address]  [place]  [place] [address]   Francisca December, M.D.  [address]  [address]  [place]  [place] [address]  Fax: [phone]

## 2010-07-05 NOTE — Cardiovascular Report (Signed)
[name], [name]             ACCOUNT NO.:  [id number]   MEDICAL RECORD NO.:  [id number]          PATIENT TYPE:  INP   LOCATION:  3703                         FACILITY:  [facility]   PHYSICIAN:  Arturo Morton. Riley Kill, M.D. [facility] OF BIRTH:  1926-08-22   DATE OF PROCEDURE:  03/25/2004  DATE OF DISCHARGE:                              CARDIAC CATHETERIZATION   INDICATIONS:  [name] is a 77 year old who presents with recurrent  episodes of syncope. She apparently had syncope last year in March, was seen  by Dr. Amil Amen, and had a Cardiolite study done as well as a nuclear study.  She has continued to have recurrent episodes of syncope and was admitted to  the hospital by the hospitalists. Eventually, Dr. Graciela Husbands was called and asked  to evaluate the patient. Right and left heart catheterization were  recommended. The patient did have some chest discomfort.   PROCEDURE:  1.  Right and left heart catheterization  2.  Selective coronary arteriography.  3.  Selective left ventriculography.   DESCRIPTION OF THE PROCEDURE:  The patient brought to cath lab and prepped  and draped in usual fashion. Through AN anterior puncture, the right femoral  vein was entered. A 7-French sheath was placed. Right heart catheterization  was performed with a thermodilution Swan-Ganz catheter. Saturations were  obtained in the superior vena cava and pulmonary artery to rule out shunt.  In addition, sequential right heart pressures were recorded. Thermodilution  cardiac outputs were performed. The right femoral artery was then entered  and a 6-French sheath placed. Pigtail catheter was placed in the central  aorta and left ventricle, and simultaneous pressures obtained.  Ventriculography was performed in the RAO projection. Following pressure  pullback, the pigtail was removed. Following this intracoronary,  nitroglycerin was given in the aorta. Coronary arteriography was performed  with standard Judkins  catheters without complications. All catheters were  subsequently removed, and she was taken to the holding area in satisfactory  clinical condition.   HEMODYNAMIC DATA:  1.  Right atrium 8.  2.  Right ventricle 33/10.  3.  Pulmonary artery 32/14.  4.  Pulmonary capillary wedge 13.  5.  Left ventricle 181/20.  6.  Aortic 182/70, mean 120.  7.  Thermodilution cardiac output 4.5 liters per minute.  8.  Thermodilution cardiac index 2.4 liters per minute per meter square.  9.  Pulmonary saturation 64%.  10. Superior vena cava saturation 64%.  11  Aortic saturation 95%.   ANGIOGRAPHIC DATA:  1.  Ventriculography is performed in the RAO projection. Overall systolic      function is preserved. Ejection fraction is greater than 60%. No wall      motion abnormalities are seen.  2.  The left main is free of critical disease.  3.  The LAD has a mildly segmental lesion of about 70-75% noted just after      the takeoff of the second diagonal branch. The second diagonal is a      moderately large vessel. The distal LAD wraps the apex.  4.  The circumflex consists of three marginal branches. The first  marginal      branch has an upward takeoff and has about a 70-75% area of segmental      narrowing proximally. The distal vessel was intact.  5.  The right coronary is a large dominant vessel. No high-grade critical      stenoses are noted.   CONCLUSION:  1.  Well-preserved left ventricular function.  2.  Two-vessel coronary artery disease with moderate to moderately severe      lesions in both the LAD in the circumflex ostium.  3.  Borderline elevation in pulmonary artery pressures.  4.  No step-up between the superior vena cava and pulmonary artery.  5.  Systemic hypertension.  6.  Well-preserved left ventricular function.  7.  Recurrent syncope of uncertain etiology.   DISCUSSION:  I have discussed case with Dr. Eden Emms and Dr. Graciela Husbands.  Importantly, the patient had episodes of syncope with a  normal Cardiolite  back in March. She clearly has LAD and circumflex disease and may, in fact,  need a percutaneous intervention of particularly the LAD. However, it is not  clear that this is related to syncope, and EP consult prior to this needs to  be obtained. Further discussions will go on regarding the appropriate  treatment strategy. I have asked Dr. Eden Emms to review the films.      TDS/MEDQ  D:  03/26/2004  T:  03/26/2004  Job:  [id number]   cc:   Charlton Haws, M.D.   Duke Salvia, M.D.   [facility]

## 2010-07-05 NOTE — Discharge Summary (Signed)
NAMEMarland Kitchen  [name], [name]                       ACCOUNT NO.:  [id number]   MEDICAL RECORD NO.:  [id number]                   PATIENT TYPE:  INP   LOCATION:  3710                                 FACILITY:  [facility]   PHYSICIAN:  Hettie Holstein, D.O.                 DATE OF BIRTH:  02-Oct-1926   DATE OF ADMISSION:  05/01/2003  DATE OF DISCHARGE:  05/04/2003                                 DISCHARGE SUMMARY   Primary care physician:  Reuben Likes, M.D.  Cardiologist:  Francisca December, M.D.   ADMISSION DIAGNOSIS:  Syncope, recurrent.   DISCHARGE DIAGNOSES:  1. Syncope, etiology unclear after extensive workup this admission,     including MRI/MRA of the brain, which revealed no acute findings, only     noncritical and nonsevere intracranial atherosclerotic changes; an EEG     that was negative; cardiac enzymes that were negative; laboratory     screening that was negative.  She was mildly orthostatic.  She received     some IV fluids as well as held her diltiazem and hydrochlorothiazide     while here in the hospital without recurrence of syncope or presyncope.     Carotid Dopplers and 2 D echocardiogram were performed.  Echocardiogram     revealed overall left ventricular function was at lower limits of normal,     50-55% range, with right ventricular systolic pressure of 34.  2. Hypertension.  3. Paroxysmal atrial fibrillation, no evidence of this this admission and no     cardiology recommendations for anticoagulation at this point.  Extensive     cardiac evaluation this hospitalization resulted in outpatient     recommendations for event monitoring.  Prior to this hospitalization she     underwent a negative Cardiolite stress test.  Cardiology recommended that     if she does have additional episodes of syncope, that this may warrant     electrophysiology evaluation or a loop recorder.  4. Pulmonary artery hypertension, mild, by 2 D echocardiogram.   DISCHARGE MEDICATIONS:  The  patient was continued on her  hydrochlorothiazide; however, her diltiazem was stopped and she was placed  on metoprolol 25 mg one p.o. b.i.d.  She is to continue her medications,  including Actonel.   As noted above, she is to follow up with Dr. Amil Amen after discharge today  for event monitor and orientation.  She was instructed not to drive until  follow-up with her primary care physician, Dr. Lorenz Coaster, whom I will try to  schedule for follow-up within the next one to two weeks.   LABORATORY DATA AT TIME OF DISCHARGE:  Sodium 136, potassium 3.8, BUN 7,  creatinine 0.8.  CBC revealed WBC of 4.1, hemoglobin 11.9, MCV of 86.9,  platelet count of 177.  Cardiac enzymes were negative.  B12 within normal  limits.  Hemoccult negative.  EEG negative.  MRA performed, no acute  findings.  There was some nonsevere intracranial atherosclerotic disease.   HISTORY OF PRESENT ILLNESS:  For full details, please see H&P by Dr. Sherrie Mustache.  Briefly, this is a 77 year old woman with a past medical history significant  for hypertension and osteoarthritis, who presented to the ER with an episode  of fainting this a.m. of presentation.  She awoke in the morning  approximately 5 a.m.  She had to get up to use the bathroom.  Upon sitting  on the toilet she felt weak all over and the next thing she recalled was  being on the floor.  She is unable to state the time.  She got up and  ambulated to her bedroom; however, she passed out again.  This time she hit  her right forehead, causing a small contusion.  Subsequently she was brought  to the emergency department after calling her daughter.  In February she had  generalized weakness and symptoms consistent with presyncope and was  evaluated at the emergency department.  Her primary care physician stated  that she had a potassium of 2.7 at the time.  This was associated with an  episode of paroxysmal atrial fibrillation.  She apparently converted  spontaneously in the  emergency department with an adequate rate control.  She was seen by cardiologist Dr. Amil Amen a couple of weeks ago, who  performed a stress test that was reported as normal per the patient.  There  were no prodromal symptoms.  She had no headache, dizziness, __________  changes, dizziness, or shortness of breath, palpitations, or nausea.  She  stated that these happened without warning.   HOSPITAL COURSE:  The patient was admitted to a telemetry floor.  No  evidence of arrhythmias was noted during her course here.  She was mildly  orthostatic and she underwent a thorough evaluation neurologically and  cardiologically with consultations for both.  She underwent EEG and MRI that  revealed no findings that could attribute to her history of presenting  illness.  Otherwise her hospital course was uneventful without syncopal or  presyncopal episodes.  Her Maxzide as well as her diltiazem were held.  She  did not take her Ditropan while she was here in the hospital either.  It was  felt by Dr. Amil Amen that she could go home with an event recorder and if she  experiences another episode of syncope, he has recommended that she undergo  EP study or an implantable loop recorder.                                                Hettie Holstein, D.O.    ESS/MEDQ  D:  05/04/2003  T:  05/06/2003  Job:  [id number]   cc:   Reuben Likes, M.D.  [address]  [place]  [place] [address]  Fax: [phone]   Francisca December, M.D.  [address]  [address]  [place]  [place] [address]  Fax: [phone]

## 2011-04-16 ENCOUNTER — Other Ambulatory Visit: Payer: Self-pay | Admitting: Internal Medicine

## 2011-04-16 DIAGNOSIS — Z1231 Encounter for screening mammogram for malignant neoplasm of breast: Secondary | ICD-10-CM

## 2011-05-20 ENCOUNTER — Ambulatory Visit
Admission: RE | Admit: 2011-05-20 | Discharge: 2011-05-20 | Disposition: A | Discharge status: Home / Self Care | Payer: Medicare Other | Source: Ambulatory Visit | Attending: Internal Medicine | Admitting: Internal Medicine

## 2011-05-20 DIAGNOSIS — Z1231 Encounter for screening mammogram for malignant neoplasm of breast: Secondary | ICD-10-CM

## 2011-07-28 DIAGNOSIS — I2510 Atherosclerotic heart disease of native coronary artery without angina pectoris: Secondary | ICD-10-CM | POA: Diagnosis not present

## 2011-07-28 DIAGNOSIS — I10 Essential (primary) hypertension: Secondary | ICD-10-CM | POA: Diagnosis not present

## 2011-07-28 DIAGNOSIS — M159 Polyosteoarthritis, unspecified: Secondary | ICD-10-CM | POA: Diagnosis not present

## 2011-07-28 DIAGNOSIS — R5383 Other fatigue: Secondary | ICD-10-CM | POA: Diagnosis not present

## 2011-08-04 ENCOUNTER — Other Ambulatory Visit: Payer: Self-pay | Admitting: Internal Medicine

## 2011-08-04 DIAGNOSIS — H908 Mixed conductive and sensorineural hearing loss, unspecified: Secondary | ICD-10-CM | POA: Diagnosis not present

## 2011-08-04 DIAGNOSIS — R42 Dizziness and giddiness: Secondary | ICD-10-CM | POA: Diagnosis not present

## 2011-08-04 DIAGNOSIS — R55 Syncope and collapse: Secondary | ICD-10-CM

## 2011-08-04 DIAGNOSIS — M810 Age-related osteoporosis without current pathological fracture: Secondary | ICD-10-CM | POA: Diagnosis not present

## 2011-08-04 DIAGNOSIS — I10 Essential (primary) hypertension: Secondary | ICD-10-CM | POA: Diagnosis not present

## 2011-08-04 DIAGNOSIS — I2510 Atherosclerotic heart disease of native coronary artery without angina pectoris: Secondary | ICD-10-CM | POA: Diagnosis not present

## 2011-08-08 ENCOUNTER — Ambulatory Visit
Admission: RE | Admit: 2011-08-08 | Discharge: 2011-08-08 | Disposition: A | Discharge status: Home / Self Care | Payer: Medicare Other | Source: Ambulatory Visit | Attending: Internal Medicine | Admitting: Internal Medicine

## 2011-08-08 DIAGNOSIS — R55 Syncope and collapse: Secondary | ICD-10-CM

## 2011-08-08 DIAGNOSIS — R51 Headache: Secondary | ICD-10-CM | POA: Diagnosis not present

## 2011-08-08 DIAGNOSIS — I6529 Occlusion and stenosis of unspecified carotid artery: Secondary | ICD-10-CM | POA: Diagnosis not present

## 2011-08-08 DIAGNOSIS — I6789 Other cerebrovascular disease: Secondary | ICD-10-CM | POA: Diagnosis not present

## 2011-08-18 DIAGNOSIS — R5381 Other malaise: Secondary | ICD-10-CM | POA: Diagnosis not present

## 2011-08-18 DIAGNOSIS — I10 Essential (primary) hypertension: Secondary | ICD-10-CM | POA: Diagnosis not present

## 2011-08-18 DIAGNOSIS — R5383 Other fatigue: Secondary | ICD-10-CM | POA: Diagnosis not present

## 2011-08-18 DIAGNOSIS — R42 Dizziness and giddiness: Secondary | ICD-10-CM | POA: Diagnosis not present

## 2011-08-18 DIAGNOSIS — I2510 Atherosclerotic heart disease of native coronary artery without angina pectoris: Secondary | ICD-10-CM | POA: Diagnosis not present

## 2011-09-29 DIAGNOSIS — L821 Other seborrheic keratosis: Secondary | ICD-10-CM | POA: Diagnosis not present

## 2011-09-29 DIAGNOSIS — Z85828 Personal history of other malignant neoplasm of skin: Secondary | ICD-10-CM | POA: Diagnosis not present

## 2011-09-29 DIAGNOSIS — L570 Actinic keratosis: Secondary | ICD-10-CM | POA: Diagnosis not present

## 2012-02-23 DIAGNOSIS — R5383 Other fatigue: Secondary | ICD-10-CM | POA: Diagnosis not present

## 2012-02-23 DIAGNOSIS — R42 Dizziness and giddiness: Secondary | ICD-10-CM | POA: Diagnosis not present

## 2012-02-23 DIAGNOSIS — I2510 Atherosclerotic heart disease of native coronary artery without angina pectoris: Secondary | ICD-10-CM | POA: Diagnosis not present

## 2012-02-23 DIAGNOSIS — I10 Essential (primary) hypertension: Secondary | ICD-10-CM | POA: Diagnosis not present

## 2012-03-01 DIAGNOSIS — M159 Polyosteoarthritis, unspecified: Secondary | ICD-10-CM | POA: Diagnosis not present

## 2012-03-01 DIAGNOSIS — I10 Essential (primary) hypertension: Secondary | ICD-10-CM | POA: Diagnosis not present

## 2012-03-01 DIAGNOSIS — I2510 Atherosclerotic heart disease of native coronary artery without angina pectoris: Secondary | ICD-10-CM | POA: Diagnosis not present

## 2012-03-01 DIAGNOSIS — R5383 Other fatigue: Secondary | ICD-10-CM | POA: Diagnosis not present

## 2012-03-01 DIAGNOSIS — R5381 Other malaise: Secondary | ICD-10-CM | POA: Diagnosis not present

## 2012-04-27 ENCOUNTER — Other Ambulatory Visit: Payer: Self-pay

## 2012-04-27 DIAGNOSIS — Z1231 Encounter for screening mammogram for malignant neoplasm of breast: Secondary | ICD-10-CM

## 2012-05-31 ENCOUNTER — Ambulatory Visit
Admission: RE | Admit: 2012-05-31 | Discharge: 2012-05-31 | Disposition: A | Discharge status: Home / Self Care | Payer: Medicare Other | Source: Ambulatory Visit

## 2012-05-31 DIAGNOSIS — Z1231 Encounter for screening mammogram for malignant neoplasm of breast: Secondary | ICD-10-CM

## 2012-08-02 DIAGNOSIS — M159 Polyosteoarthritis, unspecified: Secondary | ICD-10-CM | POA: Diagnosis not present

## 2012-08-02 DIAGNOSIS — I2510 Atherosclerotic heart disease of native coronary artery without angina pectoris: Secondary | ICD-10-CM | POA: Diagnosis not present

## 2012-08-02 DIAGNOSIS — I10 Essential (primary) hypertension: Secondary | ICD-10-CM | POA: Diagnosis not present

## 2012-08-02 DIAGNOSIS — R5381 Other malaise: Secondary | ICD-10-CM | POA: Diagnosis not present

## 2012-08-02 DIAGNOSIS — R5383 Other fatigue: Secondary | ICD-10-CM | POA: Diagnosis not present

## 2012-08-02 DIAGNOSIS — Z0000 Encounter for general adult medical examination without abnormal findings: Secondary | ICD-10-CM | POA: Diagnosis not present

## 2012-08-02 DIAGNOSIS — N390 Urinary tract infection, site not specified: Secondary | ICD-10-CM | POA: Diagnosis not present

## 2012-08-09 DIAGNOSIS — M810 Age-related osteoporosis without current pathological fracture: Secondary | ICD-10-CM | POA: Diagnosis not present

## 2012-08-09 DIAGNOSIS — I10 Essential (primary) hypertension: Secondary | ICD-10-CM | POA: Diagnosis not present

## 2012-08-09 DIAGNOSIS — I2510 Atherosclerotic heart disease of native coronary artery without angina pectoris: Secondary | ICD-10-CM | POA: Diagnosis not present

## 2012-08-09 DIAGNOSIS — R5383 Other fatigue: Secondary | ICD-10-CM | POA: Diagnosis not present

## 2012-08-09 DIAGNOSIS — I4891 Unspecified atrial fibrillation: Secondary | ICD-10-CM | POA: Diagnosis not present

## 2012-08-09 DIAGNOSIS — R5381 Other malaise: Secondary | ICD-10-CM | POA: Diagnosis not present

## 2012-08-16 ENCOUNTER — Observation Stay (HOSPITAL_COMMUNITY)
Admission: EM | Admit: 2012-08-16 | Discharge: 2012-08-18 | Disposition: A | Discharge status: Home / Self Care | Payer: Medicare Other | Attending: Internal Medicine | Admitting: Internal Medicine

## 2012-08-16 ENCOUNTER — Encounter (HOSPITAL_COMMUNITY): Payer: Self-pay | Admitting: *Deleted

## 2012-08-16 ENCOUNTER — Emergency Department (HOSPITAL_COMMUNITY): Payer: Medicare Other

## 2012-08-16 DIAGNOSIS — R209 Unspecified disturbances of skin sensation: Secondary | ICD-10-CM | POA: Insufficient documentation

## 2012-08-16 DIAGNOSIS — R55 Syncope and collapse: Secondary | ICD-10-CM | POA: Diagnosis not present

## 2012-08-16 DIAGNOSIS — K219 Gastro-esophageal reflux disease without esophagitis: Secondary | ICD-10-CM

## 2012-08-16 DIAGNOSIS — F411 Generalized anxiety disorder: Secondary | ICD-10-CM | POA: Diagnosis not present

## 2012-08-16 DIAGNOSIS — R5383 Other fatigue: Secondary | ICD-10-CM | POA: Diagnosis not present

## 2012-08-16 DIAGNOSIS — R9431 Abnormal electrocardiogram [ECG] [EKG]: Secondary | ICD-10-CM | POA: Insufficient documentation

## 2012-08-16 DIAGNOSIS — R5381 Other malaise: Secondary | ICD-10-CM | POA: Diagnosis not present

## 2012-08-16 DIAGNOSIS — I2510 Atherosclerotic heart disease of native coronary artery without angina pectoris: Secondary | ICD-10-CM | POA: Diagnosis not present

## 2012-08-16 DIAGNOSIS — R079 Chest pain, unspecified: Secondary | ICD-10-CM

## 2012-08-16 DIAGNOSIS — I10 Essential (primary) hypertension: Secondary | ICD-10-CM | POA: Diagnosis present

## 2012-08-16 DIAGNOSIS — R0602 Shortness of breath: Secondary | ICD-10-CM | POA: Diagnosis not present

## 2012-08-16 DIAGNOSIS — I4891 Unspecified atrial fibrillation: Secondary | ICD-10-CM | POA: Diagnosis not present

## 2012-08-16 DIAGNOSIS — F419 Anxiety disorder, unspecified: Secondary | ICD-10-CM | POA: Diagnosis present

## 2012-08-16 DIAGNOSIS — E871 Hypo-osmolality and hyponatremia: Secondary | ICD-10-CM | POA: Diagnosis present

## 2012-08-16 DIAGNOSIS — E876 Hypokalemia: Secondary | ICD-10-CM | POA: Diagnosis not present

## 2012-08-16 DIAGNOSIS — Z1212 Encounter for screening for malignant neoplasm of rectum: Secondary | ICD-10-CM | POA: Diagnosis not present

## 2012-08-16 DIAGNOSIS — I071 Rheumatic tricuspid insufficiency: Secondary | ICD-10-CM

## 2012-08-16 DIAGNOSIS — E782 Mixed hyperlipidemia: Secondary | ICD-10-CM

## 2012-08-16 HISTORY — DX: Atherosclerotic heart disease of native coronary artery without angina pectoris: I25.10

## 2012-08-16 HISTORY — DX: Unspecified osteoarthritis, unspecified site: M19.90

## 2012-08-16 HISTORY — DX: Anxiety disorder, unspecified: F41.9

## 2012-08-16 HISTORY — DX: Gastro-esophageal reflux disease without esophagitis: K21.9

## 2012-08-16 HISTORY — DX: Barrett's esophagus without dysplasia: K22.70

## 2012-08-16 HISTORY — DX: Essential (primary) hypertension: I10

## 2012-08-16 LAB — PROTIME-INR
INR: 1.47 (ref 0.00–1.49)
Prothrombin Time: 17.4 seconds — ABNORMAL HIGH (ref 11.6–15.2)

## 2012-08-16 LAB — BASIC METABOLIC PANEL
CO2: 27 mEq/L (ref 19–32)
Calcium: 9.9 mg/dL (ref 8.4–10.5)
GFR calc non Af Amer: 58 mL/min — ABNORMAL LOW (ref >90)
Potassium: 2.8 mEq/L — ABNORMAL LOW (ref 3.5–5.1)
Sodium: 133 mEq/L — ABNORMAL LOW (ref 135–145)

## 2012-08-16 LAB — POCT I-STAT TROPONIN I: Troponin i, poc: 0.01 ng/mL (ref 0.00–0.08)

## 2012-08-16 LAB — CBC
Hemoglobin: 13.8 g/dL (ref 12.0–15.0)
MCH: 30.9 pg (ref 26.0–34.0)
Platelets: 264 10*3/uL (ref 150–400)
RBC: 4.47 MIL/uL (ref 3.87–5.11)

## 2012-08-16 LAB — PRO B NATRIURETIC PEPTIDE: Pro B Natriuretic peptide (BNP): 807.6 pg/mL — ABNORMAL HIGH (ref 0–450)

## 2012-08-16 LAB — TROPONIN I: Troponin I: <0.3 ng/mL (ref <0.30)

## 2012-08-16 MED ORDER — ONDANSETRON HCL 4 MG PO TABS: 4.0000 mg | ORAL_TABLET | Freq: Four times a day (QID) | ORAL | Status: DC | PRN

## 2012-08-16 MED ORDER — ALPRAZOLAM 0.25 MG PO TABS: 0.2500 mg | ORAL_TABLET | Freq: Every day | ORAL | Status: DC | PRN

## 2012-08-16 MED ORDER — PANTOPRAZOLE SODIUM 40 MG PO TBEC
40.0000 mg | DELAYED_RELEASE_TABLET | Freq: Every day | ORAL | Status: DC
  Administered 2012-08-16 – 2012-08-18 (×3): 40 mg via ORAL
  Filled 2012-08-16 (×3): qty 1

## 2012-08-16 MED ORDER — VITAMIN B-12 1000 MCG PO TABS
1000.0000 ug | ORAL_TABLET | Freq: Every day | ORAL | Status: DC
  Administered 2012-08-17 – 2012-08-18 (×2): 1000 ug via ORAL
  Filled 2012-08-16 (×3): qty 1

## 2012-08-16 MED ORDER — VITAMIN E 180 MG (400 UNIT) PO CAPS
400.0000 [IU] | ORAL_CAPSULE | Freq: Every day | ORAL | Status: DC
  Administered 2012-08-17 – 2012-08-18 (×3): 400 [IU] via ORAL
  Filled 2012-08-16 (×3): qty 1

## 2012-08-16 MED ORDER — POTASSIUM CHLORIDE CRYS ER 20 MEQ PO TBCR
40.0000 meq | EXTENDED_RELEASE_TABLET | Freq: Once | ORAL | Status: AC
  Administered 2012-08-16: 40 meq via ORAL
  Filled 2012-08-16: qty 2

## 2012-08-16 MED ORDER — VITAMIN C 500 MG PO TABS
500.0000 mg | ORAL_TABLET | Freq: Every day | ORAL | Status: DC
Start: 2012-08-16 — End: 2012-08-18
  Administered 2012-08-17 – 2012-08-18 (×2): 500 mg via ORAL
  Filled 2012-08-16 (×3): qty 1

## 2012-08-16 MED ORDER — POTASSIUM CHLORIDE CRYS ER 20 MEQ PO TBCR
40.0000 meq | EXTENDED_RELEASE_TABLET | ORAL | Status: AC
  Administered 2012-08-16: 40 meq via ORAL
  Filled 2012-08-16: qty 1

## 2012-08-16 MED ORDER — ONDANSETRON HCL 4 MG/2ML IJ SOLN: 4.0000 mg | Freq: Four times a day (QID) | INTRAMUSCULAR | Status: DC | PRN

## 2012-08-16 MED ORDER — METOPROLOL TARTRATE 25 MG PO TABS
25.0000 mg | ORAL_TABLET | Freq: Two times a day (BID) | ORAL | Status: DC
  Administered 2012-08-16 – 2012-08-18 (×4): 25 mg via ORAL
  Filled 2012-08-16 (×5): qty 1

## 2012-08-16 MED ORDER — ACETAMINOPHEN 325 MG PO TABS: 650.0000 mg | ORAL_TABLET | Freq: Four times a day (QID) | ORAL | Status: DC | PRN

## 2012-08-16 MED ORDER — SODIUM CHLORIDE 0.9 % IV SOLN: INTRAVENOUS | Status: DC

## 2012-08-16 MED ORDER — ACETAMINOPHEN 650 MG RE SUPP: 650.0000 mg | Freq: Four times a day (QID) | RECTAL | Status: DC | PRN

## 2012-08-16 MED ORDER — SODIUM CHLORIDE 0.9 % IJ SOLN
3.0000 mL | Freq: Two times a day (BID) | INTRAMUSCULAR | Status: DC
  Administered 2012-08-17 – 2012-08-18 (×2): 3 mL via INTRAVENOUS

## 2012-08-16 MED ORDER — OXYBUTYNIN CHLORIDE 5 MG PO TABS
5.0000 mg | ORAL_TABLET | Freq: Every day | ORAL | Status: DC
  Administered 2012-08-17 – 2012-08-18 (×2): 5 mg via ORAL
  Filled 2012-08-16 (×3): qty 1

## 2012-08-16 MED ORDER — RIVAROXABAN 15 MG PO TABS
15.0000 mg | ORAL_TABLET | Freq: Every day | ORAL | Status: DC
  Administered 2012-08-16 – 2012-08-17 (×2): 15 mg via ORAL
  Filled 2012-08-16 (×3): qty 1

## 2012-08-16 NOTE — ED Notes (Addendum)
States "didn't feel good when I first got up this morning. Ate, showered, dressing and was on the way the dr's appt and felt tingling, breathless, like I was going to pass out"  States was not breathing fast, but was breathing deep. Seen at dr 2 weeks ago, started on blood thinner and potassium. Also was on antibiotic for UTI until Thursday.  Also c/o severe headache on Saturday. Does not normally get headaches-- made nauseated-- came and went in waves.   H/a went away on own, but came back yesterday--

## 2012-08-16 NOTE — ED Notes (Signed)
Per EMS pt from doctor's office, had regular appt today. Has been on blood thinners for 1 week, dx with a-fib last week. This am became short of breath, anxious, labored breathing. Fingers felt tingling, arms tingling. Has a lot of family stress right now. VSS. 130/72. HR 70's. O2 100% on RA.

## 2012-08-16 NOTE — ED Notes (Signed)
Attempted to call report to floor 

## 2012-08-16 NOTE — Progress Notes (Signed)
1745  Telephone report received from  [place] ,[place]    1800 to room [phone]  via   stretctcher  With nurse . Pt fully awake alert and oriented . Moving all extremities w/o deficiencies noted  No signs and symptoms of  Neuro  Assessments noted. Kept comfortable . Daughter at bedside.

## 2012-08-16 NOTE — H&P (Signed)
[facility] History and Physical  [name] ZOX:[id number] DOB: Jan 10, 1927 DOA: 08/16/2012  Referring physician: Dr. Rhunette Croft PCP: Georgianne Fick, MD  Specialists:   Chief Complaint: Shortness of breath and feeling faint  HPI: [name] is a 79 y.o. female with history of atrial fibrillation diagnosed 5 days ago and started Xarelto, hypertension and anxiety who presents with above complaints. She states that she was in the car on her way to the doctor's appointment when she suddenly began feeling very weak, short of breath and felt she was about to pass out. She also states that she started tingling all over. He denies,chest pain, focal weakness, fevers, cough, diarrhea, nausea vomiting, melena and no hematochezia. She went to her doctor's appointment and an EKG was done and she was asked to come to the ED. Upon arrival in the ED she had an EKG done which showed atrial fibrillation at a rate of 77, but she was noted while in the ED 2 tachycardia of 122, then came back down to the 90s. CXR showed no active lung disease, BNP was 807.6 and troponin was negative x. Labs revealed a potassium of 2.8 and a sodium of 133, she is admitted for further evaluation and management.   Review of Systems: The patient denies anorexia, weight loss, vision loss, decreased hearing, hoarseness, chest pain, dyspnea on exertion, peripheral edema, balance deficits, hemoptysis, abdominal pain, melena, hematochezia, severe indigestion/heartburn, hematuria, incontinence weakness, transient blindness, depression, unusual weight change, abnormal bleeding,  Past Medical History  Diagnosis Date  . Anxiety   . Atrial fibrillation   . Hypertension    History reviewed. No pertinent past surgical history. Social History:  reports that she does not drink alcohol. Her tobacco and drug histories are not on file. where does patient live--home Can patient participate in ADLs-YES  Allergies  Allergen  Reactions  . Actonel (Risedronate Sodium) Other (See Comments)    Caused a lump to come up in throat  . Ciprofloxacin Hcl     hallucinations  . Hydralazine Hcl     Unknown   . Lisinopril Cough  . Oxycodone Hcl     hallucinations  . Valsartan     unknown   Family history: Her mother died of a heart attack  Prior to Admission medications   Medication Sig Start Date End Date Taking? Authorizing Provider  calcium carbonate (OS-CAL) 600 MG TABS Take 600 mg by mouth daily.   Yes Historical Provider, MD  Cholecalciferol (VITAMIN D-3) 1000 UNITS CAPS Take 1 capsule by mouth daily.   Yes Historical Provider, MD  hydrochlorothiazide (HYDRODIURIL) 25 MG tablet Take 25 mg by mouth daily.   Yes Historical Provider, MD  metoprolol tartrate (LOPRESSOR) 25 MG tablet Take 25 mg by mouth 2 (two) times daily.   Yes Historical Provider, MD  NIFEdipine (PROCARDIA-XL/ADALAT-CC/NIFEDICAL-XL) 30 MG 24 hr tablet Take 30 mg by mouth daily.   Yes Historical Provider, MD  omeprazole (PRILOSEC) 20 MG capsule Take 20 mg by mouth daily.   Yes Historical Provider, MD  oxybutynin (DITROPAN) 5 MG tablet Take 5 mg by mouth daily.   Yes Historical Provider, MD  Rivaroxaban (XARELTO) 15 MG TABS tablet Take 15 mg by mouth daily.   Yes Historical Provider, MD  vitamin B-12 (CYANOCOBALAMIN) 1000 MCG tablet Take 1,000 mcg by mouth daily.   Yes Historical Provider, MD  vitamin C (ASCORBIC ACID) 500 MG tablet Take 500 mg by mouth daily.   Yes Historical Provider, MD  vitamin E 400 UNIT  capsule Take 400 Units by mouth daily.   Yes Historical Provider, MD   Physical Exam: Filed Vitals:   08/16/12 1630 08/16/12 1631 08/16/12 1632 08/16/12 1645  BP: 122/55 130/65 135/70 122/56  Pulse: 72 91 98 76  Temp:      TempSrc:      Resp:    8  SpO2: 95%   99%    Constitutional: Vital signs reviewed.  Patient is a well-developed and well-nourished  in no acute distress and cooperative with exam. Alert and oriented x3.  Head:  Normocephalic and atraumatic Nose: No erythema or drainage noted.   Mouth: no erythema or exudates, slightly dry MM Eyes: PERRL, EOMI, conjunctivae normal, No scleral icterus.  Neck: Supple, Trachea midline normal ROM, No JVD, mass, thyromegaly, or carotid bruit present.  Cardiovascular: Irregular irregular, rate controlled, S1 normal, S2 normal, no MRG, pulses symmetric and intact bilaterally Pulmonary/Chest: normal respiratory effort, CTAB, no wheezes, rales, or rhonchi Abdominal: Soft. Non-tender, non-distended, bowel sounds are normal, no masses, organomegaly, or guarding present.  GU: no CVA tenderness Extremities: Trace ankle edema, no cyanosis  Neurological: A&O x3, Strength is normal and symmetric bilaterally, cranial nerve II-XII are grossly intact, no focal motor deficit, sensory intact to light touch bilaterally.  Skin: Warm, dry and intact.  Psychiatric: Normal mood and affect. speech and behavior is normal.     Labs on Admission:  Basic Metabolic Panel:  Recent Labs Lab 08/16/12 1402  NA 133*  K 2.8*  CL 90*  CO2 27  GLUCOSE 88  BUN 17  CREATININE 0.89  CALCIUM 9.9   Liver Function Tests: No results found for this basename: AST, ALT, ALKPHOS, BILITOT, PROT, ALBUMIN,  in the last 168 hours No results found for this basename: LIPASE, AMYLASE,  in the last 168 hours No results found for this basename: AMMONIA,  in the last 168 hours CBC:  Recent Labs Lab 08/16/12 1402  WBC 9.2  HGB 13.8  HCT 37.5  MCV 83.9  PLT 264   Cardiac Enzymes:  Recent Labs Lab 08/16/12 1402  TROPONINI <0.30    BNP (last 3 results)  Recent Labs  08/16/12 1402  PROBNP 807.6*   CBG: No results found for this basename: GLUCAP,  in the last 168 hours  Radiological Exams on Admission: Dg Chest 2 View  08/16/2012   *RADIOLOGY REPORT*  Clinical Data: Shortness of breath, irregular heartbeat  CHEST - 2 VIEW  Comparison: Chest x-ray of 08/01/2008  Findings: No active infiltrate  or effusion is seen.  The lungs are clear but hyperaerated suggesting degree of emphysema.  Mediastinal contours are stable.  Mild cardiomegaly is stable.  No acute bony abnormality is seen.  IMPRESSION: Stable chest x-ray.  No active lung disease.   Original Report Authenticated By: Dwyane Dee, M.D.      Assessment/Plan Active Problems: Near syncope -Cardiac versus orthostasis -Cycle cardiac enzymes, monitor on telemetry obtain 2-D echo and carotid Dopplers -Obtain orthostatic vitals and further treat accordingly-noted that patient has been on HCTZ   Atrial fibrillation -Recently diagnosed, will continue metoprolol and XARELTO -Cycle cardiac enzymes as above obtained 2-D echo, TSH, follow and further treat accordingly Shortness of breath -Possibly secondary to A. Fib/cardiac versus anxiety -Chest x-ray negative, d-dimer, cycle cardiac enzymes as above and follow up -Her BNP is mildly elevated but clinically she has no gross evidence of volume overload, echo ordered as above, follow  Hypokalemia -Replace potassium -Likely secondary to HCTZ   Hyponatremia -Likely secondary to  HCTZ/1 depletion -Will hold HCTZ, cautious hydration, follow and check   Essential hypertension, benign -Continue metoprolol, follow and further treat   H/O Anxiety -She has anxiety listed on her PMH but is on no meds -Trial of Xanax as needed, and follow      Code Status: FULL Family Communication: daughter at bedside  Disposition Plan: admit to tele for observation  Time spent: >2mins  Kela Millin [facility] Pager [phone]  If 7PM-7AM, please contact night-coverage [email or web address] Password [email or web address] 08/16/2012, 6:07 PM

## 2012-08-16 NOTE — ED Notes (Signed)
New and old EKG given to Dr. Nanavati. Copies placed in pt chart. 

## 2012-08-16 NOTE — ED Notes (Signed)
Unable to obtain oral temperature at this time. Pt was drinking water upon arrival and has been advised not to do so again until after she's been evaluated.

## 2012-08-16 NOTE — ED Provider Notes (Signed)
History    CSN: [id number] Arrival date & time 08/16/12  1152  First MD Initiated Contact with Patient 08/16/12 1156     Chief Complaint  Patient presents with  . Shortness of Breath   (Consider location/radiation/quality/duration/timing/severity/associated sxs/prior Treatment) HPI Comments: Pt with hx of HTN, Afib on xarelto comes in with cc of near syncope. States that she had an episode of acute dyspnea this afternoon, and felt like she was going to pass out. There was no nausea, sweating, diophoresis, palpitations. No chest pain. She reports that if it weren't for her sitting down on a wheel chair, she likely would have collapsed.  Patient is a 79 y.o. female presenting with shortness of breath. The history is provided by the patient.  Shortness of Breath Associated symptoms: no abdominal pain, no chest pain, no cough, no diaphoresis, no neck pain, no vomiting and no wheezing    Past Medical History  Diagnosis Date  . Anxiety   . Atrial fibrillation   . Hypertension    History reviewed. No pertinent past surgical history. No family history on file. History  Substance Use Topics  . Smoking status: Not on file  . Smokeless tobacco: Not on file  . Alcohol Use: No   OB History   Grav Para Term Preterm Abortions TAB SAB Ect Mult Living                 Review of Systems  Constitutional: Negative for diaphoresis, activity change and fatigue.  HENT: Negative for facial swelling and neck pain.   Respiratory: Positive for shortness of breath. Negative for cough and wheezing.   Cardiovascular: Negative for chest pain.  Gastrointestinal: Negative for nausea, vomiting, abdominal pain, diarrhea, constipation, blood in stool and abdominal distention.  Genitourinary: Negative for hematuria and difficulty urinating.  Skin: Negative for color change.  Neurological: Positive for dizziness and light-headedness. Negative for syncope and speech difficulty.  Hematological:  Bruises/bleeds easily.  Psychiatric/Behavioral: Negative for confusion.    Allergies  Actonel; Ciprofloxacin hcl; Hydralazine hcl; Lisinopril; Oxycodone hcl; and Valsartan  Home Medications   Current Outpatient Rx  Name  Route  Sig  Dispense  Refill  . calcium carbonate (OS-CAL) 600 MG TABS   Oral   Take 600 mg by mouth daily.         . Cholecalciferol (VITAMIN D-3) 1000 UNITS CAPS   Oral   Take 1 capsule by mouth daily.         . hydrochlorothiazide (HYDRODIURIL) 25 MG tablet   Oral   Take 25 mg by mouth daily.         . metoprolol tartrate (LOPRESSOR) 25 MG tablet   Oral   Take 25 mg by mouth 2 (two) times daily.         Marland Kitchen NIFEdipine (PROCARDIA-XL/ADALAT-CC/NIFEDICAL-XL) 30 MG 24 hr tablet   Oral   Take 30 mg by mouth daily.         Marland Kitchen omeprazole (PRILOSEC) 20 MG capsule   Oral   Take 20 mg by mouth daily.         Marland Kitchen oxybutynin (DITROPAN) 5 MG tablet   Oral   Take 5 mg by mouth daily.         . Rivaroxaban (XARELTO) 15 MG TABS tablet   Oral   Take 15 mg by mouth daily.         . vitamin B-12 (CYANOCOBALAMIN) 1000 MCG tablet   Oral   Take 1,000 mcg by mouth daily.         Marland Kitchen  vitamin C (ASCORBIC ACID) 500 MG tablet   Oral   Take 500 mg by mouth daily.         . vitamin E 400 UNIT capsule   Oral   Take 400 Units by mouth daily.          BP 130/65  Pulse 91  Temp(Src) 97.4 F (36.3 C) (Oral)  Resp 24  SpO2 91% Physical Exam  Nursing note and vitals reviewed. Constitutional: She is oriented to person, place, and time. She appears well-developed and well-nourished.  HENT:  Head: Normocephalic and atraumatic.  Eyes: EOM are normal. Pupils are equal, round, and reactive to light.  Neck: Neck supple.  Cardiovascular: Normal rate.   Murmur heard. Pulmonary/Chest: Effort normal. No respiratory distress.  Abdominal: Soft. She exhibits no distension. There is no tenderness. There is no rebound and no guarding.  Neurological: She is  alert and oriented to person, place, and time.  Skin: Skin is warm and dry.    ED Course  Procedures (including critical care time) Labs Reviewed  CBC - Abnormal; Notable for the following:    MCHC 36.8 (*)    All other components within normal limits  BASIC METABOLIC PANEL - Abnormal; Notable for the following:    Sodium 133 (*)    Potassium 2.8 (*)    Chloride 90 (*)    GFR calc non Af Amer 58 (*)    GFR calc Af Amer 67 (*)    All other components within normal limits  PRO B NATRIURETIC PEPTIDE - Abnormal; Notable for the following:    Pro B Natriuretic peptide (BNP) 807.6 (*)    All other components within normal limits  PROTIME-INR - Abnormal; Notable for the following:    Prothrombin Time 17.4 (*)    All other components within normal limits  TROPONIN I  POCT I-STAT TROPONIN I   Dg Chest 2 View  08/16/2012   *RADIOLOGY REPORT*  Clinical Data: Shortness of breath, irregular heartbeat  CHEST - 2 VIEW  Comparison: Chest x-ray of 08/01/2008  Findings: No active infiltrate or effusion is seen.  The lungs are clear but hyperaerated suggesting degree of emphysema.  Mediastinal contours are stable.  Mild cardiomegaly is stable.  No acute bony abnormality is seen.  IMPRESSION: Stable chest x-ray.  No active lung disease.   Original Report Authenticated By: Dwyane Dee, M.D.   No diagnosis found.  MDM   Date: 08/16/2012  Rate: 77  Rhythm: atrial fibrillation  QRS Axis: right  Intervals: normal  ST/T Wave abnormalities: nonspecific ST/T changes  Conduction Disutrbances:none  Narrative Interpretation:   Old EKG Reviewed: changes noted -afib  DDx includes: Orthostatic hypotension Stroke Vertebral artery dissection/stenosis Dysrhythmia PE Vasovagal/neurocardiogenic syncope Aortic stenosis Valvular disorder/Cardiomyopathy Anemia  Pt comes in with cc of near syncope. PER SF syncope score - she had near syncope with dyspnea - thus wouldn't clear. In addition, she has afib,  right axis deviation, and q waves in v2, v3 that are new - thus new EKG changes appreciated. Will admit for obs, tele.    Derwood Kaplan, MD 08/16/12 1700

## 2012-08-17 ENCOUNTER — Encounter (HOSPITAL_COMMUNITY): Payer: Self-pay | Admitting: General Practice

## 2012-08-17 DIAGNOSIS — I10 Essential (primary) hypertension: Secondary | ICD-10-CM | POA: Diagnosis not present

## 2012-08-17 DIAGNOSIS — I4891 Unspecified atrial fibrillation: Secondary | ICD-10-CM | POA: Diagnosis not present

## 2012-08-17 DIAGNOSIS — E876 Hypokalemia: Secondary | ICD-10-CM | POA: Diagnosis not present

## 2012-08-17 DIAGNOSIS — M79609 Pain in unspecified limb: Secondary | ICD-10-CM

## 2012-08-17 DIAGNOSIS — I369 Nonrheumatic tricuspid valve disorder, unspecified: Secondary | ICD-10-CM | POA: Diagnosis not present

## 2012-08-17 DIAGNOSIS — M7989 Other specified soft tissue disorders: Secondary | ICD-10-CM

## 2012-08-17 DIAGNOSIS — R55 Syncope and collapse: Secondary | ICD-10-CM | POA: Diagnosis not present

## 2012-08-17 LAB — BASIC METABOLIC PANEL
BUN: 16 mg/dL (ref 6–23)
Creatinine, Ser: 0.87 mg/dL (ref 0.50–1.10)
GFR calc non Af Amer: 59 mL/min — ABNORMAL LOW (ref >90)
Glucose, Bld: 131 mg/dL — ABNORMAL HIGH (ref 70–99)
Potassium: 3.4 mEq/L — ABNORMAL LOW (ref 3.5–5.1)

## 2012-08-17 LAB — TROPONIN I
Troponin I: <0.3 ng/mL (ref <0.30)
Troponin I: <0.3 ng/mL (ref <0.30)

## 2012-08-17 LAB — URINALYSIS W MICROSCOPIC + REFLEX CULTURE
Bilirubin Urine: NEGATIVE
Ketones, ur: NEGATIVE mg/dL
Specific Gravity, Urine: 1.011 (ref 1.005–1.030)
Urobilinogen, UA: 0.2 mg/dL (ref 0.0–1.0)

## 2012-08-17 LAB — TSH: TSH: 2.725 u[IU]/mL (ref 0.350–4.500)

## 2012-08-17 MED ORDER — POTASSIUM CHLORIDE CRYS ER 20 MEQ PO TBCR
20.0000 meq | EXTENDED_RELEASE_TABLET | Freq: Once | ORAL | Status: AC
  Administered 2012-08-17: 20 meq via ORAL
  Filled 2012-08-17: qty 1

## 2012-08-17 MED ORDER — SODIUM CHLORIDE 0.9 % IV SOLN
INTRAVENOUS | Status: DC
  Administered 2012-08-17 (×2): via INTRAVENOUS

## 2012-08-17 NOTE — Progress Notes (Addendum)
[facility] PROGRESS NOTE  [name]:[id number] DOB: 1926-06-24 DOA: 08/16/2012 PCP: Georgianne Fick, MD  Assessment/Plan: Near syncope/generalized weakness -Likely due to volume depletion -Orthostatics are positive -Start IV fluids -Stop hydrochlorothiazide -May also be related to her anti-cholinergic meds (Ditropan) -Followup echocardiogram, carotid ultrasound -May have also been related to the patient's hypokalemia and hyponatremia -Check urinalysis -Neurologic exam is nonfocal -TSH 2.725, troponins negative x3 Elevated d-dimer -Venous duplex lower extremities as patient is complaining of leg edema and pain -Low suspicion of PE -CT angiogram chest venous duplex is positive Dyspnea/elevated proBNP -Dyspnea was transient--> currently 99% on room air -Echocardiogram -Chest x-ray negative for pulmonary edema or effusions -May need to consider diuresis echo suggests cor pulmonale Atrial fibrillation -Rate controlled -Continue metoprolol tartrate -Continue rivaroxaban Hypokalemia -Likely due to HCTZ -Replete -Check magnesium Hyponatremia -Likely volume depletion -Discontinue HCTZ Hypertension -Continue metoprolol tartrate -Nifedipine was discontinued  Family Communication:   Pt at beside Disposition Plan:   Home when medically stable        Procedures/Studies: Dg Chest 2 View  08/16/2012   *RADIOLOGY REPORT*  Clinical Data: Shortness of breath, irregular heartbeat  CHEST - 2 VIEW  Comparison: Chest x-ray of 08/01/2008  Findings: No active infiltrate or effusion is seen.  The lungs are clear but hyperaerated suggesting degree of emphysema.  Mediastinal contours are stable.  Mild cardiomegaly is stable.  No acute bony abnormality is seen.  IMPRESSION: Stable chest x-ray.  No active lung disease.   Original Report Authenticated By: Dwyane Dee, M.D.         Subjective: Patient is feeling better this morning. He no longer feels dizzy or weak  all over. Denies any chest discomfort, shortness of breath, nausea, vomiting, diarrhea, dizziness, abdominal pain, dysuria, hematuria.  Objective: Filed Vitals:   08/16/12 2114 08/17/12 0212 08/17/12 0603 08/17/12 0920  BP: 115/50 117/52 136/50 142/73  Pulse: 77 82 72 71  Temp: 97.2 F (36.2 C) 98.5 F (36.9 C) 98 F (36.7 C) 98 F (36.7 C)  TempSrc: Oral Oral Oral Oral  Resp: 18 16 18 18   Height:      Weight:   82.4 kg (181 lb 10.5 oz)   SpO2: 98% 98% 96% 98%    Intake/Output Summary (Last 24 hours) at 08/17/12 0923 Last data filed at 08/17/12 0900  Gross per 24 hour  Intake   1440 ml  Output    600 ml  Net    840 ml   Weight change:  Exam:   General:  Pt is alert, follows commands appropriately, not in acute distress  HEENT: No icterus, No thrush,[place]/AT  Cardiovascular: RRR, S1/S2, no rubs, no gallops  Respiratory: CTA bilaterally, no wheezing, no crackles, no rhonchi  Abdomen: Soft/+BS, non tender, non distended, no guarding  Extremities: 2+ edema, No lymphangitis, No petechiae, No rashes, no synovitis  Neuro: CNII-XII intact, strength 4/5 b/l LE, UE, sensation intact b/l, no dysmetria Data Reviewed: Basic Metabolic Panel:  Recent Labs Lab 08/16/12 1402  NA 133*  K 2.8*  CL 90*  CO2 27  GLUCOSE 88  BUN 17  CREATININE 0.89  CALCIUM 9.9   Liver Function Tests: No results found for this basename: AST, ALT, ALKPHOS, BILITOT, PROT, ALBUMIN,  in the last 168 hours No results found for this basename: LIPASE, AMYLASE,  in the last 168 hours No results found for this basename: AMMONIA,  in the last 168 hours CBC:  Recent Labs Lab 08/16/12 1402  WBC 9.2  HGB 13.8  HCT 37.5  MCV 83.9  PLT 264   Cardiac Enzymes:  Recent Labs Lab 08/16/12 1402 08/16/12 1906 08/17/12 0100  TROPONINI <0.30 <0.30 <0.30   BNP: No components found with this basename: POCBNP,  CBG: No results found for this basename: GLUCAP,  in the last 168 hours  No results  found for this or any previous visit (from the past 240 hour(s)).   Scheduled Meds: . metoprolol tartrate  25 mg Oral BID  . oxybutynin  5 mg Oral Daily  . pantoprazole  40 mg Oral Daily  . Rivaroxaban  15 mg Oral Daily  . sodium chloride  3 mL Intravenous Q12H  . vitamin B-12  1,000 mcg Oral Daily  . vitamin C  500 mg Oral Daily  . vitamin E  400 Units Oral Daily   Continuous Infusions: . sodium chloride    . sodium chloride       Jakarie Pember, DO  [facility] Pager [phone]  If 7PM-7AM, please contact night-coverage [email or web address] Password [email or web address] 08/17/2012, 9:23 AM   LOS: 1 day

## 2012-08-17 NOTE — Evaluation (Signed)
Occupational Therapy Evaluation Patient Details Name: [name] MRN: [id number] DOB: 04/04/1926 Today's Date: 08/17/2012 Time: 0935-1000 OT Time Calculation (min): 25 min  OT Assessment / Plan / Recommendation History of present illness Admitted due to near syncope "feeling faint", generalized weakness, and SOB.    Clinical Impression   Patient presents at a supervision/set up-Mod I level for BADL and will benefit from acute OT services to address generalized weakness and improve independence with BADL.    OT Assessment  Patient needs continued OT Services    Follow Up Recommendations  No OT follow up    Equipment Recommendations  Tub/shower seat vs.Tub/shower bench    Frequency  Min 1X/week    Precautions / Restrictions Precautions Precautions: Fall   Pertinent Vitals/Pain Denies pain    ADL  Grooming: Performed;Wash/dry hands;Wash/dry face;Teeth care;Brushing hair Where Assessed - Grooming: Unsupported standing Upper Body Bathing: Performed;Set up Where Assessed - Upper Body Bathing: Unsupported sitting Lower Body Bathing: Performed;Set up Where Assessed - Lower Body Bathing: Unsupported sit to stand;Unsupported sitting;Unsupported standing Upper Body Dressing: Set up;Performed (hosp gown) Where Assessed - Upper Body Dressing: Unsupported sitting Lower Body Dressing: Performed;Set up (underware and gripper socks) Where Assessed - Lower Body Dressing: Unsupported sitting;Unsupported standing;Unsupported sit to stand Toilet Transfer: Performed;Modified independent Toilet Transfer Method: Sit to stand;Stand pivot Acupuncturist: Comfort height toilet Toileting - Clothing Manipulation and Hygiene: Performed Where Assessed - Engineer, mining and Hygiene: Sit to stand from 3-in-1 or toilet;Standing;Sit on 3-in-1 or toilet Transfers/Ambulation Related to ADLs: ambulated to/from bathroom and sink with supervision and no AD    OT Diagnosis:  Generalized weakness  OT Problem List: Decreased strength;Decreased knowledge of use of DME or AE OT Treatment Interventions: DME and/or AE instruction;Energy conservation   OT Goals(Current goals can be found in the care plan section) Acute Rehab OT Goals Patient Stated Goal: Hope to go home tomorrow after tests today. OT Goal Formulation: With patient Time For Goal Achievement: 08/24/12 Potential to Achieve Goals: Good ADL Goals Pt Will Perform Grooming: with modified independence;standing Pt Will Perform Upper Body Bathing: with modified independence;sitting;standing Pt Will Perform Lower Body Bathing: with modified independence;sit to/from stand Pt Will Perform Upper Body Dressing: with modified independence;sitting;standing Pt Will Perform Lower Body Dressing: with modified independence;sit to/from stand Pt Will Perform Tub/Shower Transfer: Tub transfer;grab bars;shower seat vs. tub bench;with modified independence  Visit Information  Last OT Received On: 08/17/12 Assistance Needed: +1 History of Present Illness: Admitted due to near syncope "feeling faint", generalized weakness, and SOB.        Prior Functioning     Home Living Family/patient expects to be discharged to:: Private residence Living Arrangements: Children (daughter's home) Available Help at Discharge: Family Type of Home: House Home Access: Stairs to enter Secretary/administrator of Steps: 4-5 Entrance Stairs-Rails: Can reach both;Left;Right Home Layout: Two level;Bed/bath upstairs;1/2 bath on main level Alternate Level Stairs-Number of Steps: 12-15 Alternate Level Stairs-Rails: Right Home Equipment: Walker - standard;Cane - single point;Wheelchair - manual Additional Comments: DME belonged to her deceased husband. Prior Function Level of Independence: Independent Communication Communication: No difficulties Dominant Hand: Right    Vision/Perception Vision - History Baseline Vision: Wears glasses all  the time Patient Visual Report: No change from baseline   Cognition  Cognition Arousal/Alertness: Awake/alert Overall Cognitive Status: Within Functional Limits for tasks assessed    Extremity/Trunk Assessment Upper Extremity Assessment Upper Extremity Assessment: Overall WFL for tasks assessed Lower Extremity Assessment Lower Extremity Assessment: Defer to PT  evaluation     Mobility Bed Mobility Bed Mobility: Rolling Right;Right Sidelying to Sit Rolling Right: 6: Modified independent (Device/Increase time) Right Sidelying to Sit: 6: Modified independent (Device/Increase time) Transfers Transfers: Sit to Stand;Stand to Sit Sit to Stand: 6: Modified independent (Device/Increase time) Stand to Sit: 6: Modified independent (Device/Increase time)     End of Session OT - End of Session Activity Tolerance: Patient tolerated treatment well Patient left: in bed;with call bell/phone within reach  GO     [name] 08/17/2012, 10:25 AM

## 2012-08-17 NOTE — Evaluation (Signed)
Physical Therapy Evaluation Patient Details Name: [name] MRN: [id number] DOB: November 25, 1926 Today's Date: 08/17/2012 Time: 9562-1308 PT Time Calculation (min): 15 min  PT Assessment / Plan / Recommendation History of Present Illness  Admitted due to near syncope "feeling faint", generalized weakness, and SOB.   Clinical Impression  Pt functioning at baseline. No episodes of dizziness, SOB or LOB. Pt safe to d/c home from mobility stand point. Pt with no further acute skilled PT needs or PT follow up post d/c. PT signing off at this time. Please re-consult in future if needed. Thank you.    PT Assessment  Patent does not need any further PT services    Follow Up Recommendations  No PT follow up    Does the patient have the potential to tolerate intense rehabilitation      Barriers to Discharge        Equipment Recommendations  None recommended by PT    Recommendations for Other Services     Frequency      Precautions / Restrictions Precautions Precautions: Fall Restrictions Weight Bearing Restrictions: No   Pertinent Vitals/Pain Denies pain      Mobility  Bed Mobility Bed Mobility: Rolling Right;Right Sidelying to Sit Rolling Right: 6: Modified independent (Device/Increase time) Right Sidelying to Sit: 6: Modified independent (Device/Increase time) Transfers Transfers: Sit to Stand;Stand to Sit Sit to Stand: 6: Modified independent (Device/Increase time) Stand to Sit: 6: Modified independent (Device/Increase time) Details for Transfer Assistance: pt with safe technique Ambulation/Gait Ambulation/Gait Assistance: 5: Supervision (due to first time amb with PT) Ambulation Distance (Feet): 200 Feet Assistive device: None Ambulation/Gait Assistance Details: pt with no episodes of LOB, no onset of SOB Gait Pattern: Within Functional Limits Gait velocity: WFL for age, pt reports ambulation is back to normal Stairs: Yes Stairs Assistance: 4: Min guard Stair  Management Technique: One rail Right;Forwards Number of Stairs: 5 (limited by IV line)    Exercises     PT Diagnosis:    PT Problem List:   PT Treatment Interventions:       PT Goals(Current goals can be found in the care plan section) Acute Rehab PT Goals Patient Stated Goal: Hope to go home tomorrow after tests today. PT Goal Formulation: No goals set, d/c therapy  Visit Information  Last PT Received On: 08/17/12 Assistance Needed: +1 History of Present Illness: Admitted due to near syncope "feeling faint", generalized weakness, and SOB.        Prior Functioning  Home Living Family/patient expects to be discharged to:: Private residence Living Arrangements: Children (daughter's home) Available Help at Discharge: Family Type of Home: House Home Access: Stairs to enter Secretary/administrator of Steps: 4-5 Entrance Stairs-Rails: Can reach both;Left;Right Home Layout: Two level;Bed/bath upstairs;1/2 bath on main level Alternate Level Stairs-Number of Steps: 12-15 Alternate Level Stairs-Rails: Right Home Equipment: Walker - standard;Cane - single point;Wheelchair - manual Additional Comments: DME belonged to her deceased husband. Prior Function Level of Independence: Independent Communication Communication: No difficulties Dominant Hand: Right    Cognition  Cognition Arousal/Alertness: Awake/alert Behavior During Therapy: WFL for tasks assessed/performed Overall Cognitive Status: Within Functional Limits for tasks assessed    Extremity/Trunk Assessment Upper Extremity Assessment Upper Extremity Assessment: Overall WFL for tasks assessed Lower Extremity Assessment Lower Extremity Assessment: Overall WFL for tasks assessed Cervical / Trunk Assessment Cervical / Trunk Assessment: Normal   Balance Balance Balance Assessed: Yes Static Standing Balance Static Standing - Balance Support: No upper extremity supported Static Standing - Level of  Assistance: 6: Modified  independent (Device/Increase time) Static Standing - Comment/# of Minutes: 3 min  End of Session PT - End of Session Equipment Utilized During Treatment: Gait belt Activity Tolerance: Patient tolerated treatment well Patient left: in chair;with call bell/phone within reach Nurse Communication: Mobility status  GP Functional Assessment Tool Used: clinical judgment Functional Limitation: Mobility: Walking and moving around Mobility: Walking and Moving Around Current Status (Q4696): At least 1 percent but less than 20 percent impaired, limited or restricted Mobility: Walking and Moving Around Goal Status [phone]): At least 1 percent but less than 20 percent impaired, limited or restricted Mobility: Walking and Moving Around Discharge Status [phone]): At least 1 percent but less than 20 percent impaired, limited or restricted   Marcene Brawn 08/17/2012, 11:49 AM

## 2012-08-17 NOTE — Progress Notes (Signed)
Utilization Review Completed Karalina Tift J. Laquanna Veazey, RN, BSN, NCM [phone]  

## 2012-08-17 NOTE — Progress Notes (Signed)
  Echocardiogram 2D Echocardiogram has been performed.  [name] 08/17/2012, 5:06 PM

## 2012-08-18 ENCOUNTER — Encounter (HOSPITAL_COMMUNITY): Payer: Self-pay | Admitting: Internal Medicine

## 2012-08-18 DIAGNOSIS — I4891 Unspecified atrial fibrillation: Secondary | ICD-10-CM | POA: Diagnosis not present

## 2012-08-18 DIAGNOSIS — F411 Generalized anxiety disorder: Secondary | ICD-10-CM | POA: Diagnosis not present

## 2012-08-18 DIAGNOSIS — I071 Rheumatic tricuspid insufficiency: Secondary | ICD-10-CM

## 2012-08-18 DIAGNOSIS — I10 Essential (primary) hypertension: Secondary | ICD-10-CM

## 2012-08-18 DIAGNOSIS — I2510 Atherosclerotic heart disease of native coronary artery without angina pectoris: Secondary | ICD-10-CM

## 2012-08-18 DIAGNOSIS — R55 Syncope and collapse: Secondary | ICD-10-CM | POA: Diagnosis not present

## 2012-08-18 LAB — BASIC METABOLIC PANEL
Chloride: 100 mEq/L (ref 96–112)
GFR calc Af Amer: 70 mL/min — ABNORMAL LOW (ref >90)
GFR calc non Af Amer: 60 mL/min — ABNORMAL LOW (ref >90)
Glucose, Bld: 97 mg/dL (ref 70–99)
Potassium: 3.7 mEq/L (ref 3.5–5.1)
Sodium: 137 mEq/L (ref 135–145)

## 2012-08-18 LAB — MAGNESIUM: Magnesium: 1.4 mg/dL — ABNORMAL LOW (ref 1.5–2.5)

## 2012-08-18 MED ORDER — DEXTROSE 5 % IV SOLN
1.0000 g | INTRAVENOUS | Status: DC
  Administered 2012-08-18: 1 g via INTRAVENOUS
  Filled 2012-08-18: qty 10

## 2012-08-18 MED ORDER — MAGNESIUM CHLORIDE 64 MG PO TBEC: 1.0000 | DELAYED_RELEASE_TABLET | Freq: Every day | ORAL | Status: DC

## 2012-08-18 NOTE — Progress Notes (Signed)
VASCULAR LAB PRELIMINARY  PRELIMINARY  PRELIMINARY  PRELIMINARY  Carotid duplex completed.    Preliminary report:  Bilateral:  Less than 39% ICA stenosis.  Vertebral artery flow is antegrade.     Ellenie Salome, RVS 08/18/2012, 10:56 AM

## 2012-08-18 NOTE — Discharge Summary (Signed)
Physician Discharge Summary  [name] ZOX:[id number] DOB: 09/01/1926 DOA: 08/16/2012  PCP: Georgianne Fick, MD  Admit date: 08/16/2012 Discharge date: 08/18/2012  Recommendations for Outpatient Follow-up:  1. No physical or occupational therapy followup necessary 2. Followup with primary care doctor within 1 week.  Please follow up official reports of venous duplex lower legs, carotid duplex.  Followup blood pressure and BMP to monitor for hyponatremia and hypokalemia. 3. Followup with cardiologist as already scheduled appointment within 2 weeks of discharge  Discharge Diagnoses:  Active Problems:   Essential hypertension, benign   CAD   Atrial fibrillation   Hypertension   Anxiety   Near syncope   Hypokalemia   Hyponatremia   Discharge Condition: Stable, improved  Diet recommendation: Healthy heart  Wt Readings from Last 3 Encounters:  08/18/12 82.827 kg (182 lb 9.6 oz)  02/27/10 87.091 kg (192 lb)  09/11/09 87.091 kg (192 lb)    History of present illness:  [name] is a 79 y.o. female with history of atrial fibrillation diagnosed 5 days ago and started Xarelto, hypertension and anxiety who presents with above complaints. She states that she was in the car on her way to the doctor's appointment when she suddenly began feeling very weak, [name] of breath and felt she was about to pass out. She also states that she started tingling all over. He denies,chest pain, focal weakness, fevers, cough, diarrhea, nausea vomiting, melena and no hematochezia. She went to her doctor's appointment and an EKG was done and she was asked to come to the ED. Upon arrival in the ED she had an EKG done which showed atrial fibrillation at a rate of 77, but she was noted while in the ED 2 tachycardia of 122, then came back down to the 90s. CXR showed no active lung disease, BNP was 807.6 and troponin was negative x. Labs revealed a potassium of 2.8 and a sodium of 133, she is admitted for  further evaluation and management.  Hospital Course:   Near syncope, generalized weakness: May have been due to dehydration as her orthostatics were positive. Her hydrochlorothiazide was discontinued and she was given IV fluids. Her echocardiogram demonstrated an ejection fraction of 55-60%. She had moderate tricuspid valve regurgitation and mildly elevated pulmonary artery pressures. She had hyponatremia and hypokalemia which also may have contributed to her weakness and which improved after IV fluids and discontinuation of her diuretic. Her urinalysis was negative, TSH was 2.7-5, and troponins were negative x3. She had atrial fibrillation but did not have significant bradycardia or tachycardia.  She was seen by physical and occupational therapy who recommended that she did not need further followup.    Her d-dimer was 0.50, which was below the threshold for an 79 year old female (0.84).   Despite her low d-dimer, and she had a duplex of the lower extremities because of persistent swelling and pain. Preliminary ultrasound was negative. She was started on Xarelto a week and half ago by her primary care doctor do 2 new onset atrial fibrillation.  Her dyspnea was transient and she remained on room air. Her chest x-ray was negative for pulmonary edema or effusions. Her proBNP with only mildly elevated around 800.  Her dyspnea may have been related to dehydration and electrolyte abnormality.  Atrial fibrillation, rate controlled. She is to continue her beta blocker and her rivaroxaban.  Troponins and TSH were within normal limits.  Followup with cardiologist within 2 weeks.  Hypokalemia, likely due to hydrochlorothiazide. She was given oral  repletion.  Magnesium level is pending. Hyponatremia, likely due to volume depletion and hydrochlorothiazide. Her sodium returned to normal limits. Hypertension, blood pressures were stable to mildly elevated. She continued her metoprolol. Her nifedipine and  hydrochlorothiazide were discontinued and her blood pressure should be reevaluated by her primary care doctor or cardiologist at her followup appointment within 2 weeks.  Procedures:  Chest x-ray  Echocardiogram  Duplex lower extremities  Carotid duplex  Consultations:  None  Discharge Exam: Filed Vitals:   08/18/12 0503  BP: 144/85  Pulse: 86  Temp: 97.5 F (36.4 C)  Resp: 17   Filed Vitals:   08/17/12 1026 08/17/12 1300 08/17/12 2125 08/18/12 0503  BP: 142/73 124/73 130/76 144/85  Pulse: 71 72 71 86  Temp:  97.3 F (36.3 C) 97.9 F (36.6 C) 97.5 F (36.4 C)  TempSrc:  Oral Oral Oral  Resp:  18 18 17   Height:      Weight:    82.827 kg (182 lb 9.6 oz)  SpO2:  98% 96% 97%    General: Caucasian female, in no acute distress HEENT:  NCAT, MMM Cardiovascular: Irregularly irregular, no murmurs, rubs, gallops. 2+ pulses Respiratory: Clear to auscultation bilaterally, no increased work of breathing ABD:  NABS, soft, ND/NT MSK:  1+ lower extremity edema, normal tone and bulk NEURO: Grossly intact  Discharge Instructions      Discharge Orders   Future Appointments Provider Department Dept Phone   08/30/2012 11:10 AM Beatrice Lecher, PA-C [facility] Main [facility] [place]) [phone]   Future Orders Complete By Expires     Call MD for:  difficulty breathing, headache or visual disturbances  As directed     Call MD for:  extreme fatigue  As directed     Call MD for:  hives  As directed     Call MD for:  persistant dizziness or light-headedness  As directed     Call MD for:  persistant nausea and vomiting  As directed     Call MD for:  severe uncontrolled pain  As directed     Call MD for:  temperature >100.4  As directed     Diet - low sodium heart healthy  As directed     Discharge instructions  As directed     Comments:      You were hospitalized with shortness of breath and weakness. You might have had some dehydration, salt imbalance and a low potassium  due to your blood pressure medication HCTZ.  Please stop this medication for now and followup with your primary care doctor and cardiologist within 2 weeks of discharge. Because your blood pressure was a little low, your nifedipine was also discontinued while you were in the hospital. Please do not restart this medication until you followup with either your cardiologist or your primary care doctor. If your blood pressure is elevated at that time, they may recommend restarting this medication.  Your tests for narrowing of the blood vessels to the brain, blood clots in the legs were both negative. You have tricuspid valve regurgitation on the ultrasound of your heart, however your heart appears to be squeezing normally.    Increase activity slowly  As directed         Medication List    STOP taking these medications       hydrochlorothiazide 25 MG tablet  Commonly known as:  HYDRODIURIL     NIFEdipine 30 MG 24 hr tablet  Commonly known as:  PROCARDIA-XL/ADALAT-CC/NIFEDICAL-XL  TAKE these medications       calcium carbonate 600 MG Tabs  Commonly known as:  OS-CAL  Take 600 mg by mouth daily.     metoprolol tartrate 25 MG tablet  Commonly known as:  LOPRESSOR  Take 25 mg by mouth 2 (two) times daily.     omeprazole 20 MG capsule  Commonly known as:  PRILOSEC  Take 20 mg by mouth daily.     oxybutynin 5 MG tablet  Commonly known as:  DITROPAN  Take 5 mg by mouth daily.     vitamin B-12 1000 MCG tablet  Commonly known as:  CYANOCOBALAMIN  Take 1,000 mcg by mouth daily.     vitamin C 500 MG tablet  Commonly known as:  ASCORBIC ACID  Take 500 mg by mouth daily.     Vitamin D-3 1000 UNITS Caps  Take 1 capsule by mouth daily.     vitamin E 400 UNIT capsule  Take 400 Units by mouth daily.     XARELTO 15 MG Tabs tablet  Generic drug:  Rivaroxaban  Take 15 mg by mouth daily.       Follow-up Information   Follow up with [facility], MD. Schedule an appointment as  soon as possible for a visit in 1 week.   Contact information:   [address] [place] [address] [phone]       Follow up with Tereso Newcomer, PA-C On 08/30/2012. (11:10AM)    Contact information:   [address] [place] [address] [phone]       The results of significant diagnostics from this hospitalization (including imaging, microbiology, ancillary and laboratory) are listed below for reference.    Significant Diagnostic Studies: Dg Chest 2 View  08/16/2012   *RADIOLOGY REPORT*  Clinical Data: Shortness of breath, irregular heartbeat  CHEST - 2 VIEW  Comparison: Chest x-ray of 08/01/2008  Findings: No active infiltrate or effusion is seen.  The lungs are clear but hyperaerated suggesting degree of emphysema.  Mediastinal contours are stable.  Mild cardiomegaly is stable.  No acute bony abnormality is seen.  IMPRESSION: Stable chest x-ray.  No active lung disease.   Original Report Authenticated By: Dwyane Dee, M.D.    Microbiology: No results found for this or any previous visit (from the past 240 hour(s)).   Labs: Basic Metabolic Panel:  Recent Labs Lab 08/16/12 1402 08/17/12 0921 08/18/12 0521  NA 133* 132* 137  K 2.8* 3.4* 3.7  CL 90* 93* 100  CO2 27 25 26   GLUCOSE 88 131* 97  BUN 17 16 15   CREATININE 0.89 0.87 0.86  CALCIUM 9.9 9.4 9.1   Liver Function Tests: No results found for this basename: AST, ALT, ALKPHOS, BILITOT, PROT, ALBUMIN,  in the last 168 hours No results found for this basename: LIPASE, AMYLASE,  in the last 168 hours No results found for this basename: AMMONIA,  in the last 168 hours CBC:  Recent Labs Lab 08/16/12 1402  WBC 9.2  HGB 13.8  HCT 37.5  MCV 83.9  PLT 264   Cardiac Enzymes:  Recent Labs Lab 08/16/12 1402 08/16/12 1906 08/17/12 0100 08/17/12 0921  TROPONINI <0.30 <0.30 <0.30 <0.30   BNP: BNP (last 3 results)  Recent Labs  08/16/12 1402  PROBNP 807.6*   CBG: No  results found for this basename: GLUCAP,  in the last 168 hours  Time coordinating discharge: 45 minutes  Signed:  Renae Fickle  [facility] 08/18/2012,  11:04 AM

## 2012-08-18 NOTE — Progress Notes (Signed)
VASCULAR LAB PRELIMINARY  PRELIMINARY  PRELIMINARY  PRELIMINARY  Bilateral lower extremity venous duplex completed.    Preliminary report:  Bilateral:  No evidence of DVT, superficial thrombosis, or Baker's Cyst.   Shizuye Rupert, RVS 08/18/2012, 10:58 AM

## 2012-08-18 NOTE — Progress Notes (Signed)
OT NOTE- Late entry G-CODE  Evaluation completed on 08/17/12 by Pleas Patricia please see Doc flowsheet for full evaluation   September 17, 2012 1000  OT G-codes **NOT FOR INPATIENT CLASS**  Functional Assessment Tool Used clinical judgement  Functional Limitation Self care  Self Care Current Status (I3474) CI  Self Care Goal Status (Q5956) CI   Signed by Marlowe Sax to place note in progress session for gcodes  Lucile Shutters   OTR/L Pager: [phone] Office: [phone] .

## 2012-08-26 DIAGNOSIS — R5381 Other malaise: Secondary | ICD-10-CM | POA: Diagnosis not present

## 2012-08-26 DIAGNOSIS — I10 Essential (primary) hypertension: Secondary | ICD-10-CM | POA: Diagnosis not present

## 2012-08-26 DIAGNOSIS — R0602 Shortness of breath: Secondary | ICD-10-CM | POA: Diagnosis not present

## 2012-08-26 DIAGNOSIS — I4891 Unspecified atrial fibrillation: Secondary | ICD-10-CM | POA: Diagnosis not present

## 2012-08-26 DIAGNOSIS — R5383 Other fatigue: Secondary | ICD-10-CM | POA: Diagnosis not present

## 2012-08-26 DIAGNOSIS — I2510 Atherosclerotic heart disease of native coronary artery without angina pectoris: Secondary | ICD-10-CM | POA: Diagnosis not present

## 2012-08-30 ENCOUNTER — Ambulatory Visit (INDEPENDENT_AMBULATORY_CARE_PROVIDER_SITE_OTHER): Payer: Medicare Other | Admitting: Physician Assistant

## 2012-08-30 ENCOUNTER — Encounter: Payer: Self-pay | Admitting: Physician Assistant

## 2012-08-30 VITALS — BP 150/98 | HR 84 | Ht 66.0 in | Wt 183.1 lb

## 2012-08-30 DIAGNOSIS — I4891 Unspecified atrial fibrillation: Secondary | ICD-10-CM

## 2012-08-30 DIAGNOSIS — R0602 Shortness of breath: Secondary | ICD-10-CM | POA: Diagnosis not present

## 2012-08-30 DIAGNOSIS — I2510 Atherosclerotic heart disease of native coronary artery without angina pectoris: Secondary | ICD-10-CM | POA: Diagnosis not present

## 2012-08-30 DIAGNOSIS — I10 Essential (primary) hypertension: Secondary | ICD-10-CM | POA: Diagnosis not present

## 2012-08-30 MED ORDER — RIVAROXABAN 20 MG PO TABS: 20.0000 mg | ORAL_TABLET | Freq: Every day | ORAL | Status: DC

## 2012-08-30 NOTE — Progress Notes (Signed)
[address] [place], [place]  [address] Phone: [phone] Fax:  [phone]  Date:  08/30/2012   ID:  JASA DUNDON, DOB 05/02/1926, MRN [id number]  PCP:  Georgianne Fick, MD  Cardiologist:  Dr. Charlton Haws     History of Present Illness: [name] is a 79 y.o. female with a hx of CAD, s/p Cypher DES to LAD in 2006, HTN, syncope, HL, GERD.  Last LHC 02/2008:  pLAD 30%, mLAD stent patent, pOM 50%, EF 65%.  Last seen by Dr. Charlton Haws 02/2010.    She saw her PCP last month and was noted to be in atrial fibrillation. She was placed on Xarelto for anticoagulation. She was then admitted 6/30-7/2. She presented with weakness and shortness of breath. Sodium and potassium levels were low. Chest x-ray was negative for edema. ProBNP was just mildly elevated at 807.6. Orthostatic vital signs were abnormal. HCTZ was discontinued and she was treated with IV fluids. Echocardiogram 08/17/12: EF 55-60%, MAC, mild MR, mild LAE, mild RVE, mild to moderate RAE, moderate TR, mildly increased pulmonary artery systolic pressures. Cardiac enzymes remained negative. She had problems with LE edema. Bilateral Venous US was negative for DVT. Heart rate remained controlled in atrial fibrillation.  Carotid US 7/14:  < 39% bilat ICA.  Since d/c, her LE edema has worsened.  Her PCP has put her back on HCTZ.  LE edema is better.  She notes increased fatigue.  Really feels like thinks started in the winter.  She did not exercise as much.  When she tried to go back to walking, she started to notice DOE.  She is probably describing NYHA Class IIb symptoms.  No orthopnea, PND.  No chest pain.  No syncope.    Labs (7/14):  Na 133=>137, K 2.8=>3.7, Cr 0.86, ALT 13, Hgb 13.8, TSH 2.725,   Wt Readings from Last 3 Encounters:  08/30/12 183 lb 1.9 oz (83.063 kg)  08/18/12 182 lb 9.6 oz (82.827 kg)  02/27/10 192 lb (87.091 kg)     Past Medical History  Diagnosis Date  . Anxiety   . Atrial  fibrillation   . Hypertension   . Coronary artery disease   . Shortness of breath   . GERD (gastroesophageal reflux disease)   . Barrett's esophagus   . Arthritis   . Tricuspid valve regurgitation, moderate 08/18/2012    Current Outpatient Prescriptions  Medication Sig Dispense Refill  . calcium carbonate (OS-CAL) 600 MG TABS Take 600 mg by mouth daily.      . Cholecalciferol (VITAMIN D-3) 1000 UNITS CAPS Take 1 capsule by mouth daily.      Marland Kitchen LORazepam (ATIVAN) 0.5 MG tablet Take 0.5 mg by mouth every 8 (eight) hours as needed.       . magnesium chloride (SLOW-MAG) 64 MG TBEC Take 1 tablet (64 mg total) by mouth daily.  30 tablet  0  . metoprolol tartrate (LOPRESSOR) 25 MG tablet Take 25 mg by mouth 2 (two) times daily.      Marland Kitchen omeprazole (PRILOSEC) 20 MG capsule Take 20 mg by mouth daily.      Marland Kitchen oxybutynin (DITROPAN) 5 MG tablet Take 5 mg by mouth daily.      . potassium chloride (K-DUR) 10 MEQ tablet Take 10 mEq by mouth daily.       . Rivaroxaban (XARELTO) 15 MG TABS tablet Take 15 mg by mouth daily.      . vitamin B-12 (CYANOCOBALAMIN) 1000 MCG tablet  Take 1,000 mcg by mouth daily.      . vitamin C (ASCORBIC ACID) 500 MG tablet Take 500 mg by mouth daily.      . vitamin E 400 UNIT capsule Take 400 Units by mouth daily.       No current facility-administered medications for this visit.    Allergies:    Allergies  Allergen Reactions  . Actonel (Risedronate Sodium) Other (See Comments)    Caused a lump to come up in throat  . Ciprofloxacin Hcl     hallucinations  . Hydralazine Hcl     Unknown   . Lisinopril Cough  . Oxycodone Hcl     hallucinations  . Valsartan     unknown    Social History:  The patient  reports that she has quit smoking. She has never used smokeless tobacco. She reports that she does not drink alcohol or use illicit drugs.   ROS:  Please see the history of present illness.   She has a non-productive cough sometimes.  Has occasional nocturia.   All other  systems reviewed and negative.   PHYSICAL EXAM: VS:  BP 150/98  Pulse 84  Ht 5\' 6"  (1.676 m)  Wt 183 lb 1.9 oz (83.063 kg)  BMI 29.57 kg/m2 Well nourished, well developed, in no acute distress HEENT: normal Neck: no JVD Cardiac:  normal S1, S2; irregularly irregular rhythm; no murmur Lungs:  clear to auscultation bilaterally, no wheezing, rhonchi or rales Abd: soft, nontender, no hepatomegaly Ext: no edema Skin: warm and dry Neuro:  CNs 2-12 intact, no focal abnormalities noted  EKG:  AFib, HR 84, LAD, NSSTTW changes     ASSESSMENT AND PLAN:  1. Atrial Fibrillation:  Rate is controlled.  CHADS2-VASc=5.  She needs long term anticoagulation.  Her creatinine clearance is 63.81 cc/min.  Dose of Xarelto should be 20 mg QD.  I will change her dose to 20 mg QD.  Will need 3-4 weeks of continuous anticoagulation prior to considering DCCV.   I suspect she may be symptomatic from AFib and would benefit from trying to restore NSR. 2. Dyspnea:  Likely from AFib.  However, she had very atypical symptoms in the past with CAD.  Never really had CP.  I will arrange Lexiscan Myoview to assess for significant ischemia.  If stress test is high risk, she will need ischemic evaluation prior to proceeding with DCCV.   3. CAD:  With hx of 1st generation DES in the LAD, she should remain on low dose ASA even in the setting of Xarelto Rx.  We will make sure the patient continues to take ASA 81 mg QD.   4. Hypertension:  PCP has made adjustments with her medications recently.  Monitor for now.   5. Disposition: F/u with Dr. Charlton Haws in 4 weeks.  Signed, Tereso Newcomer, PA-C  08/30/2012 11:25 AM

## 2012-08-30 NOTE — Patient Instructions (Addendum)
Stop Xarelto 15 mg.Start Xarelto 20 mg daily    Schedule Lexiscan Myoview Follow instructions given.   Your physician recommends that you schedule a follow-up appointment in: 3 to 4 weeks with Dr.Nishan.

## 2012-09-02 ENCOUNTER — Emergency Department (HOSPITAL_COMMUNITY)
Admission: EM | Admit: 2012-09-02 | Discharge: 2012-09-02 | Disposition: A | Discharge status: Home / Self Care | Payer: Medicare Other | Attending: Emergency Medicine | Admitting: Emergency Medicine

## 2012-09-02 ENCOUNTER — Encounter (HOSPITAL_COMMUNITY): Payer: Self-pay | Admitting: Cardiology

## 2012-09-02 ENCOUNTER — Telehealth: Payer: Self-pay | Admitting: Cardiovascular Disease

## 2012-09-02 ENCOUNTER — Emergency Department (HOSPITAL_COMMUNITY): Payer: Medicare Other

## 2012-09-02 DIAGNOSIS — Z9861 Coronary angioplasty status: Secondary | ICD-10-CM | POA: Insufficient documentation

## 2012-09-02 DIAGNOSIS — M129 Arthropathy, unspecified: Secondary | ICD-10-CM | POA: Insufficient documentation

## 2012-09-02 DIAGNOSIS — F411 Generalized anxiety disorder: Secondary | ICD-10-CM | POA: Diagnosis not present

## 2012-09-02 DIAGNOSIS — K219 Gastro-esophageal reflux disease without esophagitis: Secondary | ICD-10-CM | POA: Diagnosis not present

## 2012-09-02 DIAGNOSIS — I4891 Unspecified atrial fibrillation: Secondary | ICD-10-CM | POA: Insufficient documentation

## 2012-09-02 DIAGNOSIS — R51 Headache: Secondary | ICD-10-CM | POA: Diagnosis not present

## 2012-09-02 DIAGNOSIS — Z87891 Personal history of nicotine dependence: Secondary | ICD-10-CM | POA: Insufficient documentation

## 2012-09-02 DIAGNOSIS — Z79899 Other long term (current) drug therapy: Secondary | ICD-10-CM | POA: Diagnosis not present

## 2012-09-02 DIAGNOSIS — R519 Headache, unspecified: Secondary | ICD-10-CM | POA: Diagnosis present

## 2012-09-02 DIAGNOSIS — I2510 Atherosclerotic heart disease of native coronary artery without angina pectoris: Secondary | ICD-10-CM | POA: Diagnosis not present

## 2012-09-02 DIAGNOSIS — I10 Essential (primary) hypertension: Secondary | ICD-10-CM | POA: Diagnosis not present

## 2012-09-02 DIAGNOSIS — R209 Unspecified disturbances of skin sensation: Secondary | ICD-10-CM | POA: Diagnosis not present

## 2012-09-02 DIAGNOSIS — Z8719 Personal history of other diseases of the digestive system: Secondary | ICD-10-CM | POA: Insufficient documentation

## 2012-09-02 NOTE — Telephone Encounter (Signed)
New Prob     Pt starting taking her blood thinner, XARELTO with her baby aspirin and states she is now experiencing headaches frequently. Pt would like to speak to nurse regarding this.

## 2012-09-02 NOTE — ED Provider Notes (Signed)
I saw and evaluated the patient, reviewed the resident's note and I agree with the findings and plan.  Subjective: Patient here with mild headache since last night. Denies any is with this.  Objective: Afebrile vital signs stable  Assessment/plan: Head CT negative. Neurological system is normal. She is stable for discharge  Toy Baker, MD 09/02/12 1728

## 2012-09-02 NOTE — ED Provider Notes (Signed)
History    CSN: [id number] Arrival date & time 09/02/12  1507  First MD Initiated Contact with Patient 09/02/12 1607     Chief Complaint  Patient presents with  . Headache   (Consider location/radiation/quality/duration/timing/severity/associated sxs/prior Treatment) Patient is a 79 y.o. female presenting with headaches. The history is provided by the patient.  Headache Pain location:  Generalized Quality:  Dull Radiates to:  Does not radiate Severity currently:  5/10 Severity at highest:  9/10 Onset quality:  Gradual Duration:  4 days Timing:  Intermittent Progression:  Waxing and waning Chronicity:  New Similar to prior headaches: yes ("similar to headches like when I get sick")   Context: not activity, not exposure to bright light, not caffeine, not coughing, not defecating, not eating, not stress, not exposure to cold air, not intercourse, not loud noise and not straining   Relieved by:  None tried Worsened by:  Nothing tried Ineffective treatments:  None tried Associated symptoms: numbness (bilateral hand numbness that started at the initiation of Xarelto)   Associated symptoms: no abdominal pain, no back pain, no blurred vision, no congestion, no cough, no diarrhea, no dizziness, no drainage, no ear pain, no pain, no facial pain, no fever, no focal weakness, no hearing loss, no myalgias, no nausea, no near-syncope, no neck pain, no paresthesias, no photophobia, no seizures, no sinus pressure, no sore throat and no vomiting    Past Medical History  Diagnosis Date  . Anxiety   . Atrial fibrillation   . Hypertension   . GERD (gastroesophageal reflux disease)   . Barrett's esophagus   . Arthritis   . Coronary artery disease     a. s/p Cypher DES to LAD in 2006;  b. Last LHC 02/2008:  pLAD 30%, mLAD stent patent, pOM 50%, EF 65%  . Hx of echocardiogram     Echocardiogram 08/17/12: EF 55-60%, MAC, mild MR, mild LAE, mild RVE, mild to moderate RAE, moderate TR, mildly  increased pulmonary artery systolic pressures   Past Surgical History  Procedure Laterality Date  . Ankle surgery    . Coronary angioplasty    . Esophagogastroduodenoscopy    . Abdominal hysterectomy    . Cesarean section    . Breast cyst excision     History reviewed. No pertinent family history. History  Substance Use Topics  . Smoking status: Former Games developer  . Smokeless tobacco: Never Used  . Alcohol Use: No   OB History   Grav Para Term Preterm Abortions TAB SAB Ect Mult Living                 Review of Systems  Constitutional: Negative for fever, chills, diaphoresis, activity change and appetite change.  HENT: Negative for hearing loss, ear pain, nosebleeds, congestion, sore throat, rhinorrhea, sneezing, drooling, trouble swallowing, neck pain, postnasal drip and sinus pressure.   Eyes: Negative for blurred vision, photophobia, pain, discharge and redness.  Respiratory: Negative for cough, chest tightness, wheezing and stridor.   Cardiovascular: Negative for chest pain, leg swelling and near-syncope.  Gastrointestinal: Negative for nausea, vomiting, abdominal pain, diarrhea, constipation, blood in stool and anal bleeding.  Genitourinary: Negative for hematuria, vaginal bleeding and difficulty urinating.  Musculoskeletal: Negative for myalgias, back pain and arthralgias.  Skin: Negative for pallor.  Neurological: Positive for numbness (bilateral hand numbness that started at the initiation of Xarelto) and headaches. Negative for dizziness, focal weakness, seizures, syncope, speech difficulty, weakness, light-headedness and paresthesias.  Hematological: Negative for adenopathy. Does not bruise/bleed  easily.  Psychiatric/Behavioral: Negative for confusion and agitation.    Allergies  Actonel; Ciprofloxacin hcl; Hydralazine hcl; Lisinopril; Oxycodone hcl; and Valsartan  Home Medications   Current Outpatient Rx  Name  Route  Sig  Dispense  Refill  . calcium carbonate  (OS-CAL) 600 MG TABS   Oral   Take 600 mg by mouth daily.         . Cholecalciferol (VITAMIN D-3) 1000 UNITS CAPS   Oral   Take 1 capsule by mouth daily.         Marland Kitchen LORazepam (ATIVAN) 0.5 MG tablet   Oral   Take 0.5 mg by mouth every 8 (eight) hours as needed for anxiety.          . magnesium chloride (SLOW-MAG) 64 MG TBEC   Oral   Take 1 tablet by mouth at bedtime.         . metoprolol tartrate (LOPRESSOR) 25 MG tablet   Oral   Take 25 mg by mouth 2 (two) times daily.         Marland Kitchen omeprazole (PRILOSEC) 20 MG capsule   Oral   Take 20 mg by mouth daily.         Marland Kitchen oxybutynin (DITROPAN) 5 MG tablet   Oral   Take 5 mg by mouth daily.         . potassium chloride (K-DUR) 10 MEQ tablet   Oral   Take 10 mEq by mouth at bedtime.          . Rivaroxaban (XARELTO) 20 MG TABS   Oral   Take 20 mg by mouth at bedtime.         . vitamin B-12 (CYANOCOBALAMIN) 1000 MCG tablet   Oral   Take 1,000 mcg by mouth daily.         . vitamin C (ASCORBIC ACID) 500 MG tablet   Oral   Take 500 mg by mouth daily.         . vitamin E 400 UNIT capsule   Oral   Take 400 Units by mouth daily.          BP 114/80  Pulse 78  Temp(Src) 97.6 F (36.4 C) (Oral)  Resp 16  SpO2 99% Physical Exam  Constitutional: She is oriented to person, place, and time. She appears well-developed and well-nourished. No distress.  HENT:  Head: Normocephalic and atraumatic.  Right Ear: External ear normal.  Left Ear: External ear normal.  Eyes: Conjunctivae and EOM are normal. Right eye exhibits no discharge. Left eye exhibits no discharge.  Neck: Normal range of motion. Neck supple. No JVD present.  Cardiovascular: Normal rate and normal heart sounds.  An irregularly irregular rhythm present. Exam reveals no gallop.   No murmur heard. Pulmonary/Chest: Effort normal and breath sounds normal. No stridor. No respiratory distress. She has no wheezes. She has no rales. She exhibits no  tenderness.  Abdominal: Soft. Bowel sounds are normal. She exhibits no distension. There is no tenderness. There is no rebound and no guarding.  Musculoskeletal: Normal range of motion. She exhibits no edema.  Neurological: She is alert and oriented to person, place, and time. No cranial nerve deficit (ccranial nerves III through XII grossly intact bilaterally). She exhibits normal muscle tone (Grip strength 5 out of 5 bilaterally). GCS eye subscore is 4. GCS verbal subscore is 5. GCS motor subscore is 6.  Freely moves all 4 extremities   Skin: Skin is warm. No rash noted. She is not  diaphoretic.  Psychiatric: She has a normal mood and affect. Her behavior is normal.    ED Course  Procedures (including critical care time) Labs Reviewed - No data to display Ct Head Wo Contrast  09/02/2012   *RADIOLOGY REPORT*  Clinical Data: Headache.  Recently started Xarelto.  CT HEAD WITHOUT CONTRAST  Technique:  Contiguous axial images were obtained from the base of the skull through the vertex without contrast.  Comparison: 08/08/2011  Findings: No evidence for acute hemorrhage, mass lesion, midline shift, hydrocephalus or large infarct.  Diffuse cerebral atrophy. Low density in the periventricular and subcortical white matter suggests chronic changes.  Paranasal sinuses are clear.  No acute fracture.  IMPRESSION: No acute intracranial abnormality.  Atrophy and evidence for chronic small vessel ischemic changes.   Original Report Authenticated By: Richarda Overlie, M.D.   1. Headache     MDM  Eliott Nine Portal 80 y.o. with a history of atrial fibrillation he was recently started on an anticoagulant Xarelto who presents at the recommendation of her PCP for a headache and had a CT scan. Patient headache was of gradual onset and similar headaches in the past. Started approximately 4 days ago. She does report some numbness her bilateral hands which has been occurring for approximately 2 weeks. She denies any weakness or  focal deficit. Afebrile vital signs stable. No acute distress. Cranial nerves III through XII grossly intact bilaterally. No focal deficits on exam. Patient moving all 4 extremities freely. She is alert and oriented x4. CT head negative. Considering the care to headache, time from onset the headache until now, and CT results, I doubt subarachnoid hemorrhage. Patient discharged home given strong term cautions. She is instructed to followup with her PCP. Imaging reviewed. I disucssed this patient's care with my attending, Dr. Freida Busman.  Sena Hitch, MD 09/02/12 [phone]

## 2012-09-02 NOTE — ED Notes (Signed)
Pt reports that she was started on ASA along with blood thinners that she was started on 2 weeks ago. States that she has had a dull headache since last night. No neuro deficits and pt A&Ox4. Pt reports that she had a floater in the right eye yesterday which has since improved. Denies any OTC medication for the headache.

## 2012-09-02 NOTE — Telephone Encounter (Signed)
Spoke with patient who c/o headache, nausea, no vomiting, "a little" blurred vision, and increased SOB x 2 days. Patient denies rectal bleeding and states she is not aware of any changes in her stool, even though she has not been looking at her bowel movements closely.  She denies any head trauma; denies weakness, difficulty walking or talking. Patient states she may be experiencing a little trouble getting her thoughts together.  Patient states her dose of Xarelto was increased to 20 mg and she was told to resume taking ASA 81 mg along with the Xarelto when she saw Tereso Newcomer, PA-C on Monday 7/14 for ov.  I reviewed patient with Tereso Newcomer, PA-C since he saw recently and he agreed with my thought that patient should seek evaluation in ED to r/o internal bleeding/CVA.  I advised patient of our recommendation and patient was reluctant to go to the hospital.  I advised that head CT could not be done in our office and that further testing was needed and bleeding could not be ruled out over the telephone.  Patient eventually verbalized understanding and states she is going to call her PCP to see if she could be evaluated by him before going to the hospital.  I paged Lillia Abed, [facility] liason to inform her that patient is possibly en route.

## 2012-09-05 NOTE — ED Provider Notes (Signed)
I saw and evaluated the patient, reviewed the resident's note and I agree with the findings and plan.  Toy Baker, MD 09/05/12 [phone]

## 2012-09-06 ENCOUNTER — Ambulatory Visit (HOSPITAL_COMMUNITY): Payer: Medicare Other | Attending: Cardiology | Admitting: Radiology

## 2012-09-06 VITALS — BP 149/97 | HR 80 | Ht 66.0 in | Wt 181.0 lb

## 2012-09-06 DIAGNOSIS — R079 Chest pain, unspecified: Secondary | ICD-10-CM | POA: Diagnosis not present

## 2012-09-06 DIAGNOSIS — R0602 Shortness of breath: Secondary | ICD-10-CM | POA: Diagnosis not present

## 2012-09-06 DIAGNOSIS — I2510 Atherosclerotic heart disease of native coronary artery without angina pectoris: Secondary | ICD-10-CM | POA: Diagnosis not present

## 2012-09-06 DIAGNOSIS — R0989 Other specified symptoms and signs involving the circulatory and respiratory systems: Secondary | ICD-10-CM | POA: Insufficient documentation

## 2012-09-06 DIAGNOSIS — R5383 Other fatigue: Secondary | ICD-10-CM | POA: Insufficient documentation

## 2012-09-06 DIAGNOSIS — R0609 Other forms of dyspnea: Secondary | ICD-10-CM | POA: Diagnosis not present

## 2012-09-06 DIAGNOSIS — R55 Syncope and collapse: Secondary | ICD-10-CM | POA: Diagnosis not present

## 2012-09-06 DIAGNOSIS — I10 Essential (primary) hypertension: Secondary | ICD-10-CM

## 2012-09-06 DIAGNOSIS — Z87891 Personal history of nicotine dependence: Secondary | ICD-10-CM | POA: Insufficient documentation

## 2012-09-06 DIAGNOSIS — I4891 Unspecified atrial fibrillation: Secondary | ICD-10-CM | POA: Diagnosis not present

## 2012-09-06 DIAGNOSIS — R42 Dizziness and giddiness: Secondary | ICD-10-CM | POA: Insufficient documentation

## 2012-09-06 DIAGNOSIS — R5381 Other malaise: Secondary | ICD-10-CM | POA: Insufficient documentation

## 2012-09-06 MED ORDER — REGADENOSON 0.4 MG/5ML IV SOLN
0.4000 mg | Freq: Once | INTRAVENOUS | Status: AC
  Administered 2012-09-06: 0.4 mg via INTRAVENOUS

## 2012-09-06 MED ORDER — TECHNETIUM TC 99M SESTAMIBI GENERIC - CARDIOLITE
33.0000 | Freq: Once | INTRAVENOUS | Status: AC | PRN
  Administered 2012-09-06: 33 via INTRAVENOUS

## 2012-09-06 MED ORDER — AMINOPHYLLINE 25 MG/ML IV SOLN
75.0000 mg | Freq: Once | INTRAVENOUS | Status: AC
  Administered 2012-09-06: 75 mg via INTRAVENOUS

## 2012-09-06 MED ORDER — TECHNETIUM TC 99M SESTAMIBI GENERIC - CARDIOLITE
11.0000 | Freq: Once | INTRAVENOUS | Status: AC | PRN
  Administered 2012-09-06: 11 via INTRAVENOUS

## 2012-09-06 NOTE — Progress Notes (Signed)
[facility] SITE 3 NUCLEAR MED [address] [place], [place] [address] [phone]    Cardiology Nuclear Med Study  [name] is a 79 y.o. female     MRN : [id number]     DOB: [birth year]  Procedure Date: 09/06/2012  Nuclear Med Background Indication for Stress Test:  Evaluation for Ischemia and Stent Patency History:  '06 Stent-LAD; '08 HQI:ONGEXB, EF=64%; 7/14 Echo:EF=60%; 6/30-08/18/12 Hospital with near syncope, negative enzymes; afib Cardiac Risk Factors: History of Smoking, Hypertension, Lipids and TIA  Symptoms:  Chest Pain/Pressure with and without Exertion (last episode of chest discomfort was last Thursday, 09/02/12), Diaphoresis, Dizziness, DOE and Fatigue   Nuclear Pre-Procedure Caffeine/Decaff Intake:  None NPO After: 7:00pm   Lungs:  clear O2 Sat: 98% on room air. IV 0.9% NS with Angio Cath:  22g  IV Site: R Wrist  IV Started by:  Cathlyn Parsons, RN  Chest Size (in):  38 Cup Size: C  Height: 5\' 6"  (1.676 m)  Weight:  181 lb (82.101 kg)  BMI:  Body mass index is 29.23 kg/(m^2). Tech Comments:  Lopressor taken at 0800    Nuclear Med Study 1 or 2 day study: 1 day  Stress Test Type:  Lexiscan  Reading MD: Olga Millers, MD  Order Authorizing Provider:  Charlton Haws, MD  Resting Radionuclide: Technetium 92m Sestamibi  Resting Radionuclide Dose: 10.7 mCi   Stress Radionuclide:  Technetium 38m Sestamibi  Stress Radionuclide Dose: 33.0 mCi           Stress Protocol Rest HR: 80 Stress HR: 101  Rest BP: 149/97 Stress BP: 159/103  Exercise Time (min): n/a METS: n/a   Predicted Max HR: 136 bpm % Max HR: 74.26 bpm Rate Pressure Product: 28413   Dose of Adenosine (mg):  n/a Dose of Lexiscan: 0.4 mg  Dose of Atropine (mg): n/a Dose of Dobutamine: n/a mcg/kg/min (at max HR)  Stress Test Technologist: Smiley Houseman, CMA-N  Nuclear Technologist:  Domenic Polite, CNMT     Rest Procedure:  Myocardial perfusion imaging was performed at rest 45  minutes following the intravenous administration of Technetium 63m Sestamibi.  Rest ECG: Atrial fibrillation, septal MI, nonspecific ST changes.  Stress Procedure:  The patient received IV Lexiscan 0.4 mg over 15-seconds.  Technetium 46m Sestamibi injected at 30-seconds.  Patient was very symptomatic with Lexiscan. c/o  head, chest and throat tightness.  She finally got some relief with Aminophylline 150 mg.  Quantitative spect images were obtained after a 45 minute delay.  Stress ECG: No significant ST segment change suggestive of ischemia.  QPS Raw Data Images:  Acquisition technically good; normal left ventricular size. Stress Images:  There is decreased uptake in the anterior and inferolateral walls. Rest Images:  There is decreased uptake in the anterior wall, less prominent compared to the stress images. Subtraction (SDS):  These findings are consistent with ischemia. Transient Ischemic Dilatation (Normal <1.22):  n/a Lung/Heart Ratio (Normal <0.45):  0.42  Quantitative Gated Spect Images QGS EDV:  57 ml QGS ESV:  19 ml  Impression Exercise Capacity:  Lexiscan with no exercise. BP Response:  Normal blood pressure response. Clinical Symptoms:  There is chest pain and dyspnea. ECG Impression:  No significant ST segment change suggestive of ischemia. Comparison with Prior Nuclear Study: Ischemia in anterior and inferolateral wall new since previous.  Overall Impression:  Intermediate risk stress nuclear study with small, mild intensity, reversible defects in the distal anterior and inferolateral walls consistent with ischemia.  LV Ejection Fraction: 66%.  LV Wall Motion:  NL LV Function; NL Wall Motion  Olga Millers

## 2012-09-09 ENCOUNTER — Encounter: Payer: Self-pay | Admitting: Physician Assistant

## 2012-09-10 ENCOUNTER — Other Ambulatory Visit: Payer: Self-pay

## 2012-09-10 MED ORDER — NITROGLYCERIN 0.4 MG SL SUBL: 0.4000 mg | SUBLINGUAL_TABLET | SUBLINGUAL | Status: DC | PRN

## 2012-09-15 ENCOUNTER — Ambulatory Visit: Payer: Medicare Other | Admitting: Cardiovascular Disease

## 2012-09-16 ENCOUNTER — Ambulatory Visit (INDEPENDENT_AMBULATORY_CARE_PROVIDER_SITE_OTHER): Payer: Medicare Other | Admitting: Cardiovascular Disease

## 2012-09-16 VITALS — BP 140/72 | HR 82 | Wt 182.0 lb

## 2012-09-16 DIAGNOSIS — I10 Essential (primary) hypertension: Secondary | ICD-10-CM | POA: Diagnosis not present

## 2012-09-16 DIAGNOSIS — I2510 Atherosclerotic heart disease of native coronary artery without angina pectoris: Secondary | ICD-10-CM | POA: Diagnosis not present

## 2012-09-16 DIAGNOSIS — E782 Mixed hyperlipidemia: Secondary | ICD-10-CM

## 2012-09-16 DIAGNOSIS — I4891 Unspecified atrial fibrillation: Secondary | ICD-10-CM | POA: Diagnosis not present

## 2012-09-16 NOTE — Assessment & Plan Note (Signed)
May be contributing to dyspnea  Rate control and anticoagulation is good She does not want [facility]  Continue current Rx

## 2012-09-16 NOTE — Progress Notes (Signed)
Patient ID: [name], female   DOB: 08-21-1926, 79 y.o.   MRN: [id number] [name] is a 79 y.o. female with a hx of CAD, s/p Cypher DES to LAD in 2006, HTN, syncope, HL, GERD. Last LHC 02/2008: pLAD 30%, mLAD stent patent, pOM 50%, EF 65%. Last seen by Dr. Charlton Haws 02/2010.  She saw her PCP last month and was noted to be in atrial fibrillation. She was placed on Xarelto for anticoagulation. She was then admitted 6/30-7/2. She presented with weakness and shortness of breath. Sodium and potassium levels were low. Chest x-ray was negative for edema. ProBNP was just mildly elevated at 807.6. Orthostatic vital signs were abnormal. HCTZ was discontinued and she was treated with IV fluids. Echocardiogram 08/17/12: EF 55-60%, MAC, mild MR, mild LAE, mild RVE, mild to moderate RAE, moderate TR, mildly increased pulmonary artery systolic pressures. Cardiac enzymes remained negative. She had problems with LE edema. Bilateral Venous US was negative for DVT. Heart rate remained controlled in atrial fibrillation. Carotid US 7/14: < 39% bilat ICA.  Since d/c, her LE edema has worsened. Her PCP has put her back on HCTZ. LE edema is better. She notes increased fatigue. Really feels like thinks started in the winter. She did not exercise as much. When she tried to go back to walking, she started to notice DOE. She is probably describing NYHA Class IIb symptoms. No orthopnea, PND. No chest pain. No syncope.  Labs (7/14): Na 133=>137, K 2.8=>3.7, Cr 0.86, ALT 13, Hgb 13.8, TSH 2.725,  Wt Readings from Last 3 Encounters:   08/30/12  183 lb 1.9 oz (83.063 kg)   08/18/12  182 lb 9.6 oz (82.827 kg)   02/27/10  192 lb (87.091 kg)   09/16/12 182 lbs today   . Overall Impression: Intermediate risk stress nuclear study with small, mild intensity, reversible defects in the distal anterior and inferolateral walls consistent with ischemia.  LV Ejection Fraction: 66%. LV Wall Motion: NL LV Function; NL Wall  Motion  She feels much better with no dyspnea and LE edema improved She is not having any chest pain. Discussed option of right and left cath and cardioversoin and she does not want to proceed with either   ROS: Denies fever, malais, weight loss, blurry vision, decreased visual acuity, cough, sputum, SOB, hemoptysis, pleuritic pain, palpitaitons, heartburn, abdominal pain, melena, lower extremity edema, claudication, or rash.  All other systems reviewed and negative  General: Affect appropriate Healthy:  appears stated age HEENT: normal Neck supple with no adenopathy JVP normal no bruits no thyromegaly Lungs clear with no wheezing and good diaphragmatic motion Heart:  S1/S2 no murmur, no rub, gallop or click PMI normal Abdomen: benighn, BS positve, no tenderness, no AAA no bruit.  No HSM or HJR Distal pulses intact with no bruits Trace edema with bilateral LE varicosities Neuro non-focal Skin warm and dry No muscular weakness   Current Outpatient Prescriptions  Medication Sig Dispense Refill  . calcium carbonate (OS-CAL) 600 MG TABS Take 600 mg by mouth daily.      . Cholecalciferol (VITAMIN D-3) 1000 UNITS CAPS Take 1 capsule by mouth daily.      Marland Kitchen LORazepam (ATIVAN) 0.5 MG tablet Take 0.5 mg by mouth every 8 (eight) hours as needed for anxiety.       . magnesium chloride (SLOW-MAG) 64 MG TBEC Take 1 tablet by mouth at bedtime.      . metoprolol tartrate (LOPRESSOR) 25 MG tablet Take 25 mg  by mouth 2 (two) times daily.      . nitroGLYCERIN (NITROSTAT) 0.4 MG SL tablet Place 1 tablet (0.4 mg total) under the tongue every 5 (five) minutes as needed for chest pain.  25 tablet  6  . omeprazole (PRILOSEC) 20 MG capsule Take 20 mg by mouth daily.      Marland Kitchen oxybutynin (DITROPAN) 5 MG tablet Take 5 mg by mouth daily.      . potassium chloride (K-DUR) 10 MEQ tablet Take 10 mEq by mouth at bedtime.       . Rivaroxaban (XARELTO) 20 MG TABS Take 20 mg by mouth at bedtime.      . vitamin B-12  (CYANOCOBALAMIN) 1000 MCG tablet Take 1,000 mcg by mouth daily.      . vitamin C (ASCORBIC ACID) 500 MG tablet Take 500 mg by mouth daily.      . vitamin E 400 UNIT capsule Take 400 Units by mouth daily.       No current facility-administered medications for this visit.    Allergies  Actonel; Ciprofloxacin hcl; Hydralazine hcl; Lisinopril; Oxycodone hcl; and Valsartan  Electrocardiogram:  7/14  Afib rate 89 nonspecific ST/T wave changes   Assessment and Plan

## 2012-09-16 NOTE — Assessment & Plan Note (Signed)
Cholesterol is at goal.  Continue current dose of statin and diet Rx.  No myalgias or side effects.  F/U  LFT's in 6 months. Lab Results  Component Value Date   LDLCALC 95 06/03/2006

## 2012-09-16 NOTE — Assessment & Plan Note (Signed)
Well controlled.  Continue current medications and low sodium Dash type diet.    

## 2012-09-16 NOTE — Patient Instructions (Signed)
Your physician recommends that you schedule a follow-up appointment in:  3 MONTHS WITH  DR NISHAN  Your physician recommends that you continue on your current medications as directed. Please refer to the Current Medication list given to you today.  

## 2012-09-16 NOTE — Assessment & Plan Note (Signed)
Abnormal myovue but patient not having any chest pain Discussed options She prefers continued medical Rx and no cath at this thime

## 2012-09-22 NOTE — Telephone Encounter (Signed)
Returned call to patient she stated she had a bad nose bleed last night.Stated she has several bruises on her arms and has a dull headache.Stated xarelto was increased to 20 mg daily and takes aspirin 81 mg daily.Stated she use to be on Xarelto 15 mg daily and did not have any problems.Also she forgot to ask Dr.Nishan at her last office visit she has 2 upcoming appointments with him 10/25/12 and 12/15/12 which one is she to keep.Message sent to Dr.Nishan for advice.

## 2012-09-22 NOTE — Telephone Encounter (Signed)
New Prob  Pt states last night she had a bad nose bleed and she believes it is a side effect from one of her medication. She said she was supposed to report it.

## 2012-09-23 DIAGNOSIS — I10 Essential (primary) hypertension: Secondary | ICD-10-CM | POA: Diagnosis not present

## 2012-09-28 NOTE — Telephone Encounter (Signed)
Patient called Dr.Nishan advised to decrease Xarelto to 15 mg daily.Advised to keep appointment 12/15/12.Advised to call sooner if needed.

## 2012-09-28 NOTE — Telephone Encounter (Signed)
Can go back to 15 mg of xarelto  Keep 10/29 appt

## 2012-09-30 DIAGNOSIS — I2510 Atherosclerotic heart disease of native coronary artery without angina pectoris: Secondary | ICD-10-CM | POA: Diagnosis not present

## 2012-09-30 DIAGNOSIS — I10 Essential (primary) hypertension: Secondary | ICD-10-CM | POA: Diagnosis not present

## 2012-09-30 DIAGNOSIS — I4891 Unspecified atrial fibrillation: Secondary | ICD-10-CM | POA: Diagnosis not present

## 2012-10-06 DIAGNOSIS — L570 Actinic keratosis: Secondary | ICD-10-CM | POA: Diagnosis not present

## 2012-10-06 DIAGNOSIS — Z85828 Personal history of other malignant neoplasm of skin: Secondary | ICD-10-CM | POA: Diagnosis not present

## 2012-10-25 ENCOUNTER — Ambulatory Visit: Payer: Medicare Other | Admitting: Cardiovascular Disease

## 2012-10-26 DIAGNOSIS — M25569 Pain in unspecified knee: Secondary | ICD-10-CM | POA: Diagnosis not present

## 2012-12-15 ENCOUNTER — Encounter: Payer: Self-pay | Admitting: Cardiovascular Disease

## 2012-12-15 ENCOUNTER — Ambulatory Visit (INDEPENDENT_AMBULATORY_CARE_PROVIDER_SITE_OTHER): Payer: Medicare Other | Admitting: Cardiovascular Disease

## 2012-12-15 VITALS — BP 142/76 | HR 72 | Ht 66.0 in | Wt 191.0 lb

## 2012-12-15 DIAGNOSIS — I10 Essential (primary) hypertension: Secondary | ICD-10-CM

## 2012-12-15 DIAGNOSIS — I2510 Atherosclerotic heart disease of native coronary artery without angina pectoris: Secondary | ICD-10-CM

## 2012-12-15 DIAGNOSIS — E782 Mixed hyperlipidemia: Secondary | ICD-10-CM | POA: Diagnosis not present

## 2012-12-15 DIAGNOSIS — I4891 Unspecified atrial fibrillation: Secondary | ICD-10-CM

## 2012-12-15 NOTE — Progress Notes (Signed)
Patient ID: [name], female   DOB: April 12, 1926, 79 y.o.   MRN: [id number] [name] is a 79 y.o. female with a hx of CAD, s/p Cypher DES to LAD in 2006, HTN, syncope, HL, GERD. Last LHC 02/2008: pLAD 30%, mLAD stent patent, pOM 50%, EF 65%. Last seen by Dr. Charlton Haws 02/2010.  She saw her PCP last month and was noted to be in atrial fibrillation. She was placed on Xarelto for anticoagulation. She was then admitted 6/30-7/2. She presented with weakness and shortness of breath. Sodium and potassium levels were low. Chest x-ray was negative for edema. ProBNP was just mildly elevated at 807.6. Orthostatic vital signs were abnormal. HCTZ was discontinued and she was treated with IV fluids. Echocardiogram 08/17/12: EF 55-60%, MAC, mild MR, mild LAE, mild RVE, mild to moderate RAE, moderate TR, mildly increased pulmonary artery systolic pressures. Cardiac enzymes remained negative. She had problems with LE edema. Bilateral Venous US was negative for DVT. Heart rate remained controlled in atrial fibrillation. Carotid US 7/14: < 39% bilat ICA.   Previous visit discussed cath and [facility] and patient did not want to have either Some vertigo that she takes antivert for  No chest pain or palpitations on current meds INR stable and Rx   ROS: Denies fever, malais, weight loss, blurry vision, decreased visual acuity, cough, sputum, SOB, hemoptysis, pleuritic pain, palpitaitons, heartburn, abdominal pain, melena, lower extremity edema, claudication, or rash.  All other systems reviewed and negative  General: Affect appropriate Healthy:  appears stated age HEENT: normal Neck supple with no adenopathy JVP normal no bruits no thyromegaly Lungs clear with no wheezing and good diaphragmatic motion Heart:  S1/S2 no murmur, no rub, gallop or click PMI normal Abdomen: benighn, BS positve, no tenderness, no AAA no bruit.  No HSM or HJR Distal pulses intact with no bruits No edema Neuro non-focal Skin warm  and dry No muscular weakness   Current Outpatient Prescriptions  Medication Sig Dispense Refill  . calcium carbonate (OS-CAL) 600 MG TABS Take 600 mg by mouth daily.      . Cholecalciferol (VITAMIN D-3) 1000 UNITS CAPS Take 1 capsule by mouth daily.      . hydrochlorothiazide (HYDRODIURIL) 25 MG tablet Take 25 mg by mouth daily.       Marland Kitchen LORazepam (ATIVAN) 0.5 MG tablet Take 0.5 mg by mouth every 8 (eight) hours as needed for anxiety.       . magnesium chloride (SLOW-MAG) 64 MG TBEC Take 1 tablet by mouth at bedtime.      . metoprolol tartrate (LOPRESSOR) 25 MG tablet Take 25 mg by mouth 2 (two) times daily.      . nitroGLYCERIN (NITROSTAT) 0.4 MG SL tablet Place 1 tablet (0.4 mg total) under the tongue every 5 (five) minutes as needed for chest pain.  25 tablet  6  . omeprazole (PRILOSEC) 20 MG capsule Take 20 mg by mouth daily.      Marland Kitchen oxybutynin (DITROPAN) 5 MG tablet Take 5 mg by mouth daily.      . potassium chloride (K-DUR) 10 MEQ tablet Take 10 mEq by mouth at bedtime.       . Rivaroxaban (XARELTO) 15 MG TABS tablet Take 1 tablet (15 mg total) by mouth daily.  30 tablet  6  . vitamin B-12 (CYANOCOBALAMIN) 1000 MCG tablet Take 1,000 mcg by mouth daily.      . vitamin C (ASCORBIC ACID) 500 MG tablet Take 500 mg by mouth daily.      Marland Kitchen  vitamin E 400 UNIT capsule Take 400 Units by mouth daily.       No current facility-administered medications for this visit.    Allergies  Actonel; Ciprofloxacin hcl; Hydralazine hcl; Lisinopril; Oxycodone hcl; and Valsartan  Electrocardiogram: 7/18  afib rate 84  Otherwise normal   Assessment and Plan

## 2012-12-15 NOTE — Assessment & Plan Note (Signed)
Good rate control and anticoagulation  

## 2012-12-15 NOTE — Patient Instructions (Signed)
Your physician wants you to follow-up in:  6 MONTHS WITH DR NISHAN  You will receive a reminder letter in the mail two months in advance. If you don't receive a letter, please call our office to schedule the follow-up appointment. Your physician recommends that you continue on your current medications as directed. Please refer to the Current Medication list given to you today. 

## 2012-12-15 NOTE — Assessment & Plan Note (Signed)
Stable with no angina and good activity level.  Continue medical Rx Patient does not want cath

## 2012-12-15 NOTE — Assessment & Plan Note (Signed)
Cholesterol is at goal.  Continue current dose of statin and diet Rx.  No myalgias or side effects.  F/U  LFT's in 6 months. Lab Results  Component Value Date   LDLCALC 95 06/03/2006

## 2012-12-15 NOTE — Assessment & Plan Note (Signed)
Well controlled.  Continue current medications and low sodium Dash type diet.    

## 2012-12-23 ENCOUNTER — Other Ambulatory Visit: Payer: Self-pay

## 2013-01-26 DIAGNOSIS — I10 Essential (primary) hypertension: Secondary | ICD-10-CM | POA: Diagnosis not present

## 2013-01-28 DIAGNOSIS — I4891 Unspecified atrial fibrillation: Secondary | ICD-10-CM | POA: Diagnosis not present

## 2013-01-28 DIAGNOSIS — I10 Essential (primary) hypertension: Secondary | ICD-10-CM | POA: Diagnosis not present

## 2013-01-28 DIAGNOSIS — J310 Chronic rhinitis: Secondary | ICD-10-CM | POA: Diagnosis not present

## 2013-01-28 DIAGNOSIS — R040 Epistaxis: Secondary | ICD-10-CM | POA: Diagnosis not present

## 2013-01-28 DIAGNOSIS — I2510 Atherosclerotic heart disease of native coronary artery without angina pectoris: Secondary | ICD-10-CM | POA: Diagnosis not present

## 2013-02-28 DIAGNOSIS — I2510 Atherosclerotic heart disease of native coronary artery without angina pectoris: Secondary | ICD-10-CM | POA: Diagnosis not present

## 2013-02-28 DIAGNOSIS — I10 Essential (primary) hypertension: Secondary | ICD-10-CM | POA: Diagnosis not present

## 2013-03-07 DIAGNOSIS — I2510 Atherosclerotic heart disease of native coronary artery without angina pectoris: Secondary | ICD-10-CM | POA: Diagnosis not present

## 2013-03-07 DIAGNOSIS — M159 Polyosteoarthritis, unspecified: Secondary | ICD-10-CM | POA: Diagnosis not present

## 2013-03-07 DIAGNOSIS — I10 Essential (primary) hypertension: Secondary | ICD-10-CM | POA: Diagnosis not present

## 2013-03-07 DIAGNOSIS — I4891 Unspecified atrial fibrillation: Secondary | ICD-10-CM | POA: Diagnosis not present

## 2013-04-04 DIAGNOSIS — M159 Polyosteoarthritis, unspecified: Secondary | ICD-10-CM | POA: Diagnosis not present

## 2013-04-04 DIAGNOSIS — I2510 Atherosclerotic heart disease of native coronary artery without angina pectoris: Secondary | ICD-10-CM | POA: Diagnosis not present

## 2013-04-04 DIAGNOSIS — I4891 Unspecified atrial fibrillation: Secondary | ICD-10-CM | POA: Diagnosis not present

## 2013-04-04 DIAGNOSIS — I10 Essential (primary) hypertension: Secondary | ICD-10-CM | POA: Diagnosis not present

## 2013-05-11 DIAGNOSIS — L821 Other seborrheic keratosis: Secondary | ICD-10-CM | POA: Diagnosis not present

## 2013-05-11 DIAGNOSIS — L723 Sebaceous cyst: Secondary | ICD-10-CM | POA: Diagnosis not present

## 2013-05-11 DIAGNOSIS — L570 Actinic keratosis: Secondary | ICD-10-CM | POA: Diagnosis not present

## 2013-05-11 DIAGNOSIS — Z85828 Personal history of other malignant neoplasm of skin: Secondary | ICD-10-CM | POA: Diagnosis not present

## 2013-05-17 ENCOUNTER — Other Ambulatory Visit: Payer: Self-pay

## 2013-05-17 DIAGNOSIS — Z1231 Encounter for screening mammogram for malignant neoplasm of breast: Secondary | ICD-10-CM

## 2013-06-01 ENCOUNTER — Ambulatory Visit: Payer: Medicare Other

## 2013-06-03 ENCOUNTER — Ambulatory Visit
Admission: RE | Admit: 2013-06-03 | Discharge: 2013-06-03 | Disposition: A | Discharge status: Home / Self Care | Payer: Medicare Other | Source: Ambulatory Visit

## 2013-06-03 DIAGNOSIS — Z1231 Encounter for screening mammogram for malignant neoplasm of breast: Secondary | ICD-10-CM | POA: Diagnosis not present

## 2013-06-24 ENCOUNTER — Ambulatory Visit (INDEPENDENT_AMBULATORY_CARE_PROVIDER_SITE_OTHER): Payer: Medicare Other | Admitting: Cardiovascular Disease

## 2013-06-24 ENCOUNTER — Encounter: Payer: Self-pay | Admitting: Cardiovascular Disease

## 2013-06-24 VITALS — BP 160/90 | HR 84 | Ht 67.0 in | Wt 172.1 lb

## 2013-06-24 DIAGNOSIS — E782 Mixed hyperlipidemia: Secondary | ICD-10-CM

## 2013-06-24 DIAGNOSIS — I2510 Atherosclerotic heart disease of native coronary artery without angina pectoris: Secondary | ICD-10-CM | POA: Diagnosis not present

## 2013-06-24 DIAGNOSIS — I10 Essential (primary) hypertension: Secondary | ICD-10-CM | POA: Diagnosis not present

## 2013-06-24 DIAGNOSIS — I4891 Unspecified atrial fibrillation: Secondary | ICD-10-CM | POA: Diagnosis not present

## 2013-06-24 NOTE — Assessment & Plan Note (Signed)
Well controlled.  Continue current medications and low sodium Dash type diet.    

## 2013-06-24 NOTE — Patient Instructions (Signed)
Your physician wants you to follow-up in:  6 MONTHS WITH DR NISHAN  You will receive a reminder letter in the mail two months in advance. If you don't receive a letter, please call our office to schedule the follow-up appointment. Your physician recommends that you continue on your current medications as directed. Please refer to the Current Medication list given to you today. 

## 2013-06-24 NOTE — Assessment & Plan Note (Signed)
Stable with no angina and good activity level.  Continue medical Rx  

## 2013-06-24 NOTE — Assessment & Plan Note (Signed)
Cholesterol is at goal.  Continue current dose of statin and diet Rx.  No myalgias or side effects.  F/U  LFT's in 6 months. Lab Results  Component Value Date   [place] 06/03/2006  Labs with primary

## 2013-06-24 NOTE — Progress Notes (Signed)
Patient ID: [name], female   DOB: 1926/07/03, 80 y.o.   MRN: [id number] [name] is a 80 y.o. female with a hx of CAD, s/p Cypher DES to LAD in 2006, HTN, syncope, HL, GERD. Last LHC 02/2008: pLAD 30%, mLAD stent patent, pOM 50%, EF 65%.  She saw her PCP 10/14 and was noted to be in atrial fibrillation. She was placed on Xarelto for anticoagulation. She was then admitted 6/30-7/2. She presented with weakness and shortness of breath. Sodium and potassium levels were low. Chest x-ray was negative for edema. ProBNP was just mildly elevated at 807.6. Orthostatic vital signs were abnormal. HCTZ was discontinued and she was treated with IV fluids. Echocardiogram 08/17/12: EF 55-60%, MAC, mild MR, mild LAE, mild RVE, mild to moderate RAE, moderate TR, mildly increased pulmonary artery systolic pressures. Cardiac enzymes remained negative. She had problems with LE edema. Bilateral Venous US was negative for DVT. Heart rate remained controlled in atrial fibrillation. Carotid US 7/14: < 39% bilat ICA.  Previous visit discussed cath and [facility] and patient did not want to have either  Some vertigo that she takes antivert for  No chest pain or palpitations on current meds   Changed from coumadin to low dose xarelto for anticoagulation  Mild pruritis and allergies with mild exertional dyspnea   ROS: Denies fever, malais, weight loss, blurry vision, decreased visual acuity, cough, sputum, SOB, hemoptysis, pleuritic pain, palpitaitons, heartburn, abdominal pain, melena, lower extremity edema, claudication, or rash.  All other systems reviewed and negative  General: Affect appropriate Healthy:  appears stated age 43: normal Neck supple with no adenopathy JVP normal no bruits no thyromegaly Lungs clear with no wheezing and good diaphragmatic motion Heart:  S1/S2 no murmur, no rub, gallop or click PMI normal Abdomen: benighn, BS positve, no tenderness, no AAA no bruit.  No HSM or HJR Distal  pulses intact with no bruits No edema Neuro non-focal Skin warm and dry No muscular weakness   Current Outpatient Prescriptions  Medication Sig Dispense Refill  . aspirin 81 MG tablet Take 81 mg by mouth daily.      . calcium carbonate (OS-CAL) 600 MG TABS Take 600 mg by mouth daily.      . Cholecalciferol (VITAMIN D-3) 1000 UNITS CAPS Take 1 capsule by mouth daily.      . hydrochlorothiazide (HYDRODIURIL) 25 MG tablet Take 25 mg by mouth daily.       Marland Kitchen LORazepam (ATIVAN) 0.5 MG tablet Take 0.5 mg by mouth every 8 (eight) hours as needed for anxiety.       . magnesium chloride (SLOW-MAG) 64 MG TBEC Take 1 tablet by mouth at bedtime.      . metoprolol tartrate (LOPRESSOR) 25 MG tablet Take 25 mg by mouth 2 (two) times daily.      . nitroGLYCERIN (NITROSTAT) 0.4 MG SL tablet Place 1 tablet (0.4 mg total) under the tongue every 5 (five) minutes as needed for chest pain.  25 tablet  6  . omeprazole (PRILOSEC) 20 MG capsule Take 20 mg by mouth daily.      Marland Kitchen oxybutynin (DITROPAN) 5 MG tablet Take 5 mg by mouth daily.      . potassium chloride (K-DUR) 10 MEQ tablet Take 10 mEq by mouth at bedtime.       . Rivaroxaban (XARELTO) 15 MG TABS tablet Take 1 tablet (15 mg total) by mouth daily.  30 tablet  6  . vitamin B-12 (CYANOCOBALAMIN) 1000 MCG tablet Take  1,000 mcg by mouth daily.      . vitamin C (ASCORBIC ACID) 500 MG tablet Take 500 mg by mouth daily.      . vitamin E 400 UNIT capsule Take 400 Units by mouth daily.       No current facility-administered medications for this visit.    Allergies  Actonel; Ciprofloxacin hcl; Hydralazine hcl; Lisinopril; Oxycodone hcl; and Valsartan  Electrocardiogram:  Assessment and Plan

## 2013-06-24 NOTE — Assessment & Plan Note (Signed)
Good rate control and anticoagulation with low dose xarelto

## 2013-09-28 DIAGNOSIS — Z0000 Encounter for general adult medical examination without abnormal findings: Secondary | ICD-10-CM | POA: Diagnosis not present

## 2013-09-28 DIAGNOSIS — Z1331 Encounter for screening for depression: Secondary | ICD-10-CM | POA: Diagnosis not present

## 2013-09-28 DIAGNOSIS — I10 Essential (primary) hypertension: Secondary | ICD-10-CM | POA: Diagnosis not present

## 2013-09-28 DIAGNOSIS — I2510 Atherosclerotic heart disease of native coronary artery without angina pectoris: Secondary | ICD-10-CM | POA: Diagnosis not present

## 2013-10-03 DIAGNOSIS — I4891 Unspecified atrial fibrillation: Secondary | ICD-10-CM | POA: Diagnosis not present

## 2013-10-03 DIAGNOSIS — I10 Essential (primary) hypertension: Secondary | ICD-10-CM | POA: Diagnosis not present

## 2013-10-03 DIAGNOSIS — I2510 Atherosclerotic heart disease of native coronary artery without angina pectoris: Secondary | ICD-10-CM | POA: Diagnosis not present

## 2013-10-03 DIAGNOSIS — M159 Polyosteoarthritis, unspecified: Secondary | ICD-10-CM | POA: Diagnosis not present

## 2013-11-29 ENCOUNTER — Encounter: Payer: Self-pay | Admitting: Internal Medicine

## 2013-12-22 ENCOUNTER — Encounter: Payer: Self-pay | Admitting: *Deleted

## 2014-01-02 ENCOUNTER — Encounter: Payer: Self-pay | Admitting: Cardiovascular Disease

## 2014-01-02 ENCOUNTER — Encounter: Payer: Self-pay | Admitting: Internal Medicine

## 2014-01-02 ENCOUNTER — Ambulatory Visit (INDEPENDENT_AMBULATORY_CARE_PROVIDER_SITE_OTHER): Payer: Medicare Other | Admitting: Cardiovascular Disease

## 2014-01-02 VITALS — BP 128/72 | HR 81 | Resp 12 | Ht 67.0 in | Wt 181.4 lb

## 2014-01-02 DIAGNOSIS — K2270 Barrett's esophagus without dysplasia: Secondary | ICD-10-CM | POA: Diagnosis not present

## 2014-01-02 DIAGNOSIS — I10 Essential (primary) hypertension: Secondary | ICD-10-CM

## 2014-01-02 DIAGNOSIS — I2510 Atherosclerotic heart disease of native coronary artery without angina pectoris: Secondary | ICD-10-CM | POA: Diagnosis not present

## 2014-01-02 DIAGNOSIS — E782 Mixed hyperlipidemia: Secondary | ICD-10-CM

## 2014-01-02 NOTE — Assessment & Plan Note (Signed)
Continue Prilosec  F/U Dr Henrene Pastor for repeat EGD surveillance Barretts

## 2014-01-02 NOTE — Patient Instructions (Addendum)
Your physician wants you to follow-up in:   [place] will receive a reminder letter in the mail two months in advance. If you don't receive a letter, please call our office to schedule the follow-up appointment. Your physician recommends that you continue on your current medications as directed. Please refer to the Current Medication list given to you today. You have been referred to GI  DR PERRY   PT  WOULD LIKE TO STOP  PRILOSEC  NEEDS  EEG

## 2014-01-02 NOTE — Assessment & Plan Note (Signed)
Stable with no angina and good activity level.  Continue medical Rx  

## 2014-01-02 NOTE — Progress Notes (Signed)
Patient ID: [name], female   DOB: [birth year], 80 y.o.   MRN: [id number] [name] is a 80 y.o. female with a hx of CAD, s/p Cypher DES to LAD in 2006, HTN, syncope, HL, GERD. Last LHC 02/2008: pLAD 30%, mLAD stent patent, pOM 50%, EF 65%.  She saw her PCP 10/14 and was noted to be in atrial fibrillation. She was placed on Xarelto for anticoagulation. She was then admitted 6/30-7/2. She presented with weakness and shortness of breath. Sodium and potassium levels were low. Chest x-ray was negative for edema. ProBNP was just mildly elevated at 807.6. Orthostatic vital signs were abnormal. HCTZ was discontinued and she was treated with IV fluids. Echocardiogram 08/17/12: EF 55-60%, MAC, mild MR, mild LAE, mild RVE, mild to moderate RAE, moderate TR, mildly increased pulmonary artery systolic pressures. Cardiac enzymes remained negative. She had problems with LE edema. Bilateral Venous US was negative for DVT. Heart rate remained controlled in atrial fibrillation. Carotid US 7/14: < 39% bilat ICA.  Previous visit discussed cath and [facility] and patient did not want to have either  Some vertigo that she takes antivert for  No chest pain or palpitations on current meds   Changed from coumadin to low dose xarelto for anticoagulation  Mild pruritis and allergies with mild exertional dyspnea  Wants to come off prilosec but has history of Barrett's and needs f/u with Dr Henrene Pastor    ROS: Denies fever, malais, weight loss, blurry vision, decreased visual acuity, cough, sputum, SOB, hemoptysis, pleuritic pain, palpitaitons, heartburn, abdominal pain, melena, lower extremity edema, claudication, or rash.  All other systems reviewed and negative  General: Affect appropriate Healthy:  appears stated age 20: normal Neck supple with no adenopathy JVP normal no bruits no thyromegaly Lungs clear with no wheezing and good diaphragmatic motion Heart:  S1/S2 no murmur, no rub, gallop or click PMI  normal Abdomen: benighn, BS positve, no tenderness, no AAA no bruit.  No HSM or HJR Distal pulses intact with no bruits No edema Neuro non-focal Skin warm and dry No muscular weakness   Current Outpatient Prescriptions  Medication Sig Dispense Refill  . aspirin 81 MG tablet Take 81 mg by mouth daily.    . calcium carbonate (OS-CAL) 600 MG TABS Take 600 mg by mouth daily.    . Cholecalciferol (VITAMIN D-3) 1000 UNITS CAPS Take 1 capsule by mouth daily.    . hydrochlorothiazide (HYDRODIURIL) 25 MG tablet Take 25 mg by mouth daily.     Marland Kitchen LORazepam (ATIVAN) 0.5 MG tablet Take 0.5 mg by mouth every 8 (eight) hours as needed for anxiety.     . magnesium chloride (SLOW-MAG) 64 MG TBEC Take 1 tablet by mouth at bedtime.    . metoprolol tartrate (LOPRESSOR) 25 MG tablet Take 25 mg by mouth 2 (two) times daily.    . nitroGLYCERIN (NITROSTAT) 0.4 MG SL tablet Place 1 tablet (0.4 mg total) under the tongue every 5 (five) minutes as needed for chest pain. 25 tablet 6  . omeprazole (PRILOSEC) 20 MG capsule Take 20 mg by mouth daily.    Marland Kitchen oxybutynin (DITROPAN) 5 MG tablet Take 5 mg by mouth daily.    . potassium chloride (K-DUR) 10 MEQ tablet Take 10 mEq by mouth at bedtime.     . Rivaroxaban (XARELTO) 15 MG TABS tablet Take 1 tablet (15 mg total) by mouth daily. 30 tablet 6  . vitamin B-12 (CYANOCOBALAMIN) 1000 MCG tablet Take 1,000 mcg by mouth daily.    Marland Kitchen  vitamin C (ASCORBIC ACID) 500 MG tablet Take 500 mg by mouth daily.    . vitamin E 400 UNIT capsule Take 400 Units by mouth daily.     No current facility-administered medications for this visit.    Allergies  Actonel; Ciprofloxacin hcl; Hydralazine hcl; Lisinopril; Oxycodone hcl; and Valsartan  Electrocardiogram:  7/14 afib rate 78  Nonspecific ST/T wave changes  Today afib rate 81  LAD nonspecific ST changes no change from 7/14 Assessment and Plan

## 2014-01-02 NOTE — Assessment & Plan Note (Signed)
Well controlled.  Continue current medications and low sodium Dash type diet.    

## 2014-01-02 NOTE — Assessment & Plan Note (Signed)
Good rate control and anticoagulation No bleeding issues  Stable

## 2014-01-02 NOTE — Assessment & Plan Note (Signed)
Cholesterol is at goal.  Continue current dose of statin and diet Rx.  No myalgias or side effects.  F/U  LFT's in 6 months. Lab Results  Component Value Date   [place] 06/03/2006   Labs with primary

## 2014-03-03 ENCOUNTER — Encounter: Payer: Self-pay | Admitting: Internal Medicine

## 2014-03-03 ENCOUNTER — Ambulatory Visit (INDEPENDENT_AMBULATORY_CARE_PROVIDER_SITE_OTHER): Payer: Medicare Other | Admitting: Internal Medicine

## 2014-03-03 VITALS — BP 160/84 | HR 76 | Ht 63.75 in | Wt 180.5 lb

## 2014-03-03 DIAGNOSIS — K219 Gastro-esophageal reflux disease without esophagitis: Secondary | ICD-10-CM | POA: Diagnosis not present

## 2014-03-03 DIAGNOSIS — K2270 Barrett's esophagus without dysplasia: Secondary | ICD-10-CM

## 2014-03-03 NOTE — Progress Notes (Signed)
HISTORY OF PRESENT ILLNESS:  [name] is a 81 y.o. female with multiple significant medical problems who was sent over today by her cardiologist for GERD and Barrett's esophagus. The patient was evaluated in 2010 for GERD. At that time, it was clear that she was having significant reflux symptoms off PPI. No reflux symptoms on PPI. She did undergo upper endoscopy 09/19/2008. She was found to have short segment nondysplastic Barrett's. Due to her age, no surveillance recommended. She has continued on PPI. The patient has been having progressive problems with arthritis and ambulation. She wonders if this is related to Prilosec. GI review of systems is otherwise negative.  REVIEW OF SYSTEMS:  All non-GI ROS negative except for arthritis, anxiety, sinus and allergy, cough, itching, muscle cramps, shortness of breath  Past Medical History  Diagnosis Date  . Anxiety   . Atrial fibrillation   . Hypertension   . GERD (gastroesophageal reflux disease)   . Barrett's esophagus   . Arthritis   . Coronary artery disease     a. s/p Cypher DES to LAD in 2006;  b. Last LHC 02/2008:  pLAD 30%, mLAD stent patent, pOM 50%, EF 65%;  c. Lexiscan Myoview 7/14:  Intermediate risk, dist ant and inf-lat ischemia, EF 66%  . Hx of echocardiogram     Echocardiogram 08/17/12: EF 55-60%, MAC, mild MR, mild LAE, mild RVE, mild to moderate RAE, moderate TR, mildly increased pulmonary artery systolic pressures  . Hyperlipidemia   . Hiatal hernia   . Obesity   . Hypertension     Past Surgical History  Procedure Laterality Date  . Ankle surgery    . Coronary angioplasty    . Esophagogastroduodenoscopy    . Abdominal hysterectomy    . Cesarean section    . Breast cyst excision      Social History [name]  reports that she has quit smoking. Her smoking use included Cigarettes. She has never used smokeless tobacco. She reports that she does not drink alcohol or use illicit drugs.  family history  includes Diabetes in her mother; Heart attack in her mother; Hypertension in her brother, brother, sister, sister, sister, and sister; Lung cancer in her father; Thyroid disease in her sister and sister. There is no history of Colon cancer, Colon polyps, or Esophageal cancer.  Allergies  Allergen Reactions  . Actonel [Risedronate Sodium] Other (See Comments)    Caused a lump to come up in throat  . Ciprofloxacin Hcl     hallucinations  . Hydralazine Hcl     Unknown   . Lisinopril Cough  . Oxycodone Hcl     hallucinations  . Valsartan     unknown       PHYSICAL EXAMINATION: Vital signs: BP 160/84 mmHg  Pulse 76  Ht 5' 3.75" (1.619 m)  Wt 180 lb 8 oz (81.874 kg)  BMI 31.24 kg/m2 General: Well-developed, well-nourished, no acute distress HEENT: Sclerae are anicteric, conjunctiva pink. Oral mucosa intact Lungs: Clear Heart: Irregular Abdomen: soft, obese, nontender, nondistended, no obvious ascites, no peritoneal signs, normal bowel sounds. No organomegaly. Extremities: No edema Psychiatric: alert and oriented x3. Cooperative   ASSESSMENT:  #1. GERD. Symptoms controlled with PPI #2. Short segment nondysplastic Barrett's in an 81 year old  PLAN:  #1. Continue PPI. Reassured her that her arthritic complaints or not related to PPI #2. Barrett surveillance not indicated given her age and comorbidities.

## 2014-03-03 NOTE — Patient Instructions (Signed)
Please follow up with Dr. Perry as needed 

## 2014-03-29 DIAGNOSIS — I10 Essential (primary) hypertension: Secondary | ICD-10-CM | POA: Diagnosis not present

## 2014-03-29 DIAGNOSIS — I25119 Atherosclerotic heart disease of native coronary artery with unspecified angina pectoris: Secondary | ICD-10-CM | POA: Diagnosis not present

## 2014-04-05 DIAGNOSIS — I481 Persistent atrial fibrillation: Secondary | ICD-10-CM | POA: Diagnosis not present

## 2014-04-05 DIAGNOSIS — I2510 Atherosclerotic heart disease of native coronary artery without angina pectoris: Secondary | ICD-10-CM | POA: Diagnosis not present

## 2014-04-05 DIAGNOSIS — I10 Essential (primary) hypertension: Secondary | ICD-10-CM | POA: Diagnosis not present

## 2014-05-17 DIAGNOSIS — D1801 Hemangioma of skin and subcutaneous tissue: Secondary | ICD-10-CM | POA: Diagnosis not present

## 2014-05-17 DIAGNOSIS — Z85828 Personal history of other malignant neoplasm of skin: Secondary | ICD-10-CM | POA: Diagnosis not present

## 2014-05-17 DIAGNOSIS — L821 Other seborrheic keratosis: Secondary | ICD-10-CM | POA: Diagnosis not present

## 2014-05-17 DIAGNOSIS — D485 Neoplasm of uncertain behavior of skin: Secondary | ICD-10-CM | POA: Diagnosis not present

## 2014-05-17 DIAGNOSIS — C44622 Squamous cell carcinoma of skin of right upper limb, including shoulder: Secondary | ICD-10-CM | POA: Diagnosis not present

## 2014-05-19 ENCOUNTER — Other Ambulatory Visit: Payer: Self-pay

## 2014-05-19 DIAGNOSIS — Z1231 Encounter for screening mammogram for malignant neoplasm of breast: Secondary | ICD-10-CM

## 2014-06-05 DIAGNOSIS — H2513 Age-related nuclear cataract, bilateral: Secondary | ICD-10-CM | POA: Diagnosis not present

## 2014-06-07 ENCOUNTER — Ambulatory Visit
Admission: RE | Admit: 2014-06-07 | Discharge: 2014-06-07 | Disposition: A | Discharge status: Home / Self Care | Payer: Medicare Other | Source: Ambulatory Visit

## 2014-06-07 DIAGNOSIS — Z1231 Encounter for screening mammogram for malignant neoplasm of breast: Secondary | ICD-10-CM | POA: Diagnosis not present

## 2014-06-15 DIAGNOSIS — C44622 Squamous cell carcinoma of skin of right upper limb, including shoulder: Secondary | ICD-10-CM | POA: Diagnosis not present

## 2014-06-30 NOTE — Progress Notes (Signed)
Patient ID: [name], female   DOB: Mar 30, 1926, 81 y.o.   MRN: [id number] [name] is a 81 y.o. Marland Kitchen female with a hx of CAD, s/p Cypher DES to LAD in 2006, HTN, syncope, HL, GERD. Last LHC 02/2008: pLAD 30%, mLAD stent patent, pOM 50%,  She saw her PCP 10/14 and was noted to be in atrial fibrillation. She was placed on Xarelto for anticoagulation. She was then admitted 6/30-7/2. She presented with weakness and shortness of breath. Sodium and potassium levels were low. Chest x-ray was negative for edema. ProBNP was just mildly elevated at 807.6. Orthostatic vital signs were abnormal. HCTZ was discontinued and she was treated with IV fluids.   Echocardiogram 08/17/12: EF 55-60%, MAC, mild MR, mild LAE, mild RVE, mild to moderate RAE, moderate TR, mildly increased pulmonary artery systolic pressures. Cardiac enzymes remained negative. She had problems with LE edema. Bilateral Venous US was negative for DVT. Heart rate remained controlled in atrial fibrillation.   Carotid US 7/14: < 39% bilat ICA.   Previous visit discussed cath and [facility] and patient did not want to have either  Some vertigo that she takes antivert for   No chest pain or palpitations on current meds   Changed from coumadin to low dose xarelto for anticoagulation  Mild pruritis and allergies with mild exertional dyspnea  History of Barrett's saw Dr Henrene Pastor and he advised continuing prilosec and no need for EGD given age and comorbidities   Primary limitations are knee pain.  Does not want DCC.  Has sick son inlaw at home with DM,CRF and non healing ulcers     ROS: Denies fever, malais, weight loss, blurry vision, decreased visual acuity, cough, sputum, SOB, hemoptysis, pleuritic pain, palpitaitons, heartburn, abdominal pain, melena, lower extremity edema, claudication, or rash.  All other systems reviewed and negative  General: Affect appropriate Healthy:  appears stated age 81: normal Neck supple with no  adenopathy JVP normal no bruits no thyromegaly Lungs clear with no wheezing and good diaphragmatic motion Heart:  S1/S2 no murmur, no rub, gallop or click PMI normal Abdomen: benighn, BS positve, no tenderness, no AAA no bruit.  No HSM or HJR Distal pulses intact with no bruits No edema Neuro non-focal Skin warm and dry No muscular weakness   Current Outpatient Prescriptions  Medication Sig Dispense Refill  . aspirin 81 MG tablet Take 81 mg by mouth daily.    . calcium carbonate (OS-CAL) 600 MG TABS Take 600 mg by mouth daily.    . Cholecalciferol (VITAMIN D-3) 1000 UNITS CAPS Take 1 capsule by mouth daily.    . hydrochlorothiazide (HYDRODIURIL) 25 MG tablet Take 25 mg by mouth daily.     Marland Kitchen LORazepam (ATIVAN) 0.5 MG tablet Take 0.5 mg by mouth every 8 (eight) hours as needed for anxiety.     . magnesium chloride (SLOW-MAG) 64 MG TBEC Take 1 tablet by mouth at bedtime.    . metoprolol tartrate (LOPRESSOR) 25 MG tablet Take 25 mg by mouth 2 (two) times daily.    . nitroGLYCERIN (NITROSTAT) 0.4 MG SL tablet Place 1 tablet (0.4 mg total) under the tongue every 5 (five) minutes as needed for chest pain. 25 tablet 6  . omeprazole (PRILOSEC) 20 MG capsule Take 20 mg by mouth daily.    Marland Kitchen oxybutynin (DITROPAN) 5 MG tablet Take 5 mg by mouth daily.    . potassium chloride (K-DUR) 10 MEQ tablet Take 10 mEq by mouth at bedtime.     Marland Kitchen  Rivaroxaban (XARELTO) 79 MG TABS tablet Take 1 tablet (15 mg total) by mouth daily. 30 tablet 6  . vitamin B-12 (CYANOCOBALAMIN) 1000 MCG tablet Take 1,000 mcg by mouth daily.    . vitamin C (ASCORBIC ACID) 500 MG tablet Take 500 mg by mouth daily.    . vitamin E 400 UNIT capsule Take 400 Units by mouth daily.     No current facility-administered medications for this visit.    Allergies  Actonel; Ciprofloxacin hcl; Hydralazine hcl; Lisinopril; Oxycodone hcl; and Valsartan  Electrocardiogram:  7/14 afib rate 78  Nonspecific ST/T wave changes  Today afib rate 81   LAD nonspecific ST changes no change from 7/14 Assessment and Plan  Afib:  Rate contorl and low dose xarelto given age does not want [place] f/u 3 months  HTN:  On diuretic and beta blocker f/u 3 months to assess low sodium diet Anticoagulation:  No falles or bleeding issues GI indicates no need for f/u EGD/colonoscopy  Cr in 3 month

## 2014-07-03 ENCOUNTER — Ambulatory Visit (INDEPENDENT_AMBULATORY_CARE_PROVIDER_SITE_OTHER): Payer: Medicare Other | Admitting: Cardiovascular Disease

## 2014-07-03 ENCOUNTER — Encounter: Payer: Self-pay | Admitting: Cardiovascular Disease

## 2014-07-03 VITALS — BP 158/98 | HR 71 | Ht 65.0 in | Wt 178.4 lb

## 2014-07-03 DIAGNOSIS — I482 Chronic atrial fibrillation, unspecified: Secondary | ICD-10-CM

## 2014-07-03 NOTE — Patient Instructions (Signed)
Medication Instructions:  NO CHANGES  Labwork: NONE  Testing/Procedures: NONE  Follow-Up: Your physician recommends that you schedule a follow-up appointment in:  3 MONTHS WITH DR NISHAN  Any Other Special Instructions Will Be Listed Below (If Applicable).   

## 2014-10-02 DIAGNOSIS — M810 Age-related osteoporosis without current pathological fracture: Secondary | ICD-10-CM | POA: Diagnosis not present

## 2014-10-02 DIAGNOSIS — M159 Polyosteoarthritis, unspecified: Secondary | ICD-10-CM | POA: Diagnosis not present

## 2014-10-02 DIAGNOSIS — R42 Dizziness and giddiness: Secondary | ICD-10-CM | POA: Diagnosis not present

## 2014-10-02 DIAGNOSIS — I10 Essential (primary) hypertension: Secondary | ICD-10-CM | POA: Diagnosis not present

## 2014-10-02 DIAGNOSIS — I481 Persistent atrial fibrillation: Secondary | ICD-10-CM | POA: Diagnosis not present

## 2014-10-02 DIAGNOSIS — K4090 Unilateral inguinal hernia, without obstruction or gangrene, not specified as recurrent: Secondary | ICD-10-CM | POA: Diagnosis not present

## 2014-10-04 ENCOUNTER — Ambulatory Visit: Payer: Medicare Other | Admitting: Cardiovascular Disease

## 2014-10-10 NOTE — Progress Notes (Signed)
Patient ID: [name], female   DOB: 02-21-1926, 81 y.o.   MRN: [id number] [name] is a 81 y.o. Marland Kitchen female with a hx of CAD, s/p Cypher DES to LAD in 2006, HTN, syncope, HL, GERD. Last LHC 02/2008: pLAD 30%, mLAD stent patent, pOM 50%,  She saw her PCP 10/14 and was noted to be in atrial fibrillation. She was placed on Xarelto for anticoagulation. She was then admitted 6/30-7/2. She presented with weakness and shortness of breath. Sodium and potassium levels were low. Chest x-ray was negative for edema. ProBNP was just mildly elevated at 807.6. Orthostatic vital signs were abnormal. HCTZ was discontinued and she was treated with IV fluids.   Echocardiogram 08/17/12: EF 55-60%, MAC, mild MR, mild LAE, mild RVE, mild to moderate RAE, moderate TR, mildly increased pulmonary artery systolic pressures. Cardiac enzymes remained negative. She had problems with LE edema. Bilateral Venous US was negative for DVT. Heart rate remained controlled in atrial fibrillation.   Carotid US 7/14: < 39% bilat ICA.   Previous visit discussed cath and [facility] and patient did not want to have either  Some vertigo that she takes antivert for   No chest pain or palpitations on current meds   Changed from coumadin to low dose xarelto for anticoagulation  Mild pruritis and allergies with mild exertional dyspnea  History of Barrett's saw Dr Henrene Pastor and he advised continuing prilosec and no need for EGD given age and comorbidities   Primary limitations are knee pain.  Does not want DCC.  Has sick son inlaw at home with DM,CRF and non healing ulcers    Lab Results  Component Value Date   CREATININE 0.86 08/18/2012   BUN 15 08/18/2012   NA 137 08/18/2012   K 3.7 08/18/2012   CL 100 08/18/2012   CO2 26 08/18/2012    ROS: Denies fever, malais, weight loss, blurry vision, decreased visual acuity, cough, sputum, SOB, hemoptysis, pleuritic pain, palpitaitons, heartburn, abdominal pain, melena, lower extremity  edema, claudication, or rash.  All other systems reviewed and negative  General: Affect appropriate Healthy:  appears stated age 59: normal Neck supple with no adenopathy JVP normal no bruits no thyromegaly Lungs clear with no wheezing and good diaphragmatic motion Heart:  S1/S2 no murmur, no rub, gallop or click PMI normal Abdomen: benighn, BS positve, no tenderness, no AAA no bruit.  No HSM or HJR Distal pulses intact with no bruits No edema Neuro non-focal Skin warm and dry No muscular weakness   Current Outpatient Prescriptions  Medication Sig Dispense Refill  . aspirin 81 MG tablet Take 81 mg by mouth daily.    . calcium carbonate (OS-CAL) 600 MG TABS Take 600 mg by mouth daily.    . Cholecalciferol (VITAMIN D-3) 1000 UNITS CAPS Take 1 capsule by mouth daily.    . hydrochlorothiazide (HYDRODIURIL) 25 MG tablet Take 25 mg by mouth daily.     Marland Kitchen LORazepam (ATIVAN) 0.5 MG tablet Take 0.5 mg by mouth every 8 (eight) hours as needed for anxiety.     . magnesium chloride (SLOW-MAG) 64 MG TBEC Take 1 tablet by mouth at bedtime.    . metoprolol tartrate (LOPRESSOR) 25 MG tablet Take 25 mg by mouth 2 (two) times daily.    . nitroGLYCERIN (NITROSTAT) 0.4 MG SL tablet Place 1 tablet (0.4 mg total) under the tongue every 5 (five) minutes as needed for chest pain. 25 tablet 6  . omeprazole (PRILOSEC) 20 MG capsule Take 20 mg  by mouth daily.    Marland Kitchen oxybutynin (DITROPAN) 5 MG tablet Take 5 mg by mouth daily.    . potassium chloride (K-DUR) 10 MEQ tablet Take 10 mEq by mouth at bedtime.     . Rivaroxaban (XARELTO) 15 MG TABS tablet Take 1 tablet (15 mg total) by mouth daily. 30 tablet 6  . vitamin B-12 (CYANOCOBALAMIN) 1000 MCG tablet Take 1,000 mcg by mouth daily.    . vitamin C (ASCORBIC ACID) 500 MG tablet Take 500 mg by mouth daily.    . vitamin E 400 UNIT capsule Take 400 Units by mouth daily.     No current facility-administered medications for this visit.    Allergies  Actonel;  Ciprofloxacin hcl; Hydralazine hcl; Lisinopril; Oxycodone hcl; and Valsartan  Electrocardiogram:  7/14 afib rate 78  Nonspecific ST/T wave changes   06/2014  afib rate 81  LAD nonspecific ST changes no change from 7/14  Assessment and Plan  Afib:  Rate contorl and low dose xarelto given age does not want [place] f/u 6 months  HTN:  On diuretic and beta blocker f/u 71months to assess low sodium diet Anticoagulation:  No falles or bleeding issues GI indicates no need for f/u EGD/colonoscopy  Cr in 3 month

## 2014-10-12 ENCOUNTER — Encounter: Payer: Self-pay | Admitting: Cardiovascular Disease

## 2014-10-12 ENCOUNTER — Ambulatory Visit (INDEPENDENT_AMBULATORY_CARE_PROVIDER_SITE_OTHER): Payer: Medicare Other | Admitting: Cardiovascular Disease

## 2014-10-12 ENCOUNTER — Encounter (HOSPITAL_COMMUNITY): Payer: Self-pay | Admitting: Physical Medicine and Rehabilitation

## 2014-10-12 ENCOUNTER — Inpatient Hospital Stay (HOSPITAL_COMMUNITY)
Admission: EM | Admit: 2014-10-12 | Discharge: 2014-10-13 | DRG: 312 | Disposition: A | Discharge status: Home / Self Care | Payer: Medicare Other | Attending: Internal Medicine | Admitting: Internal Medicine

## 2014-10-12 ENCOUNTER — Emergency Department (HOSPITAL_COMMUNITY): Payer: Medicare Other

## 2014-10-12 VITALS — BP 142/80 | HR 55 | Wt 174.0 lb

## 2014-10-12 DIAGNOSIS — I2510 Atherosclerotic heart disease of native coronary artery without angina pectoris: Secondary | ICD-10-CM | POA: Diagnosis present

## 2014-10-12 DIAGNOSIS — Z87891 Personal history of nicotine dependence: Secondary | ICD-10-CM | POA: Diagnosis not present

## 2014-10-12 DIAGNOSIS — R404 Transient alteration of awareness: Secondary | ICD-10-CM | POA: Diagnosis not present

## 2014-10-12 DIAGNOSIS — I4891 Unspecified atrial fibrillation: Secondary | ICD-10-CM | POA: Diagnosis present

## 2014-10-12 DIAGNOSIS — Z7901 Long term (current) use of anticoagulants: Secondary | ICD-10-CM

## 2014-10-12 DIAGNOSIS — R9431 Abnormal electrocardiogram [ECG] [EKG]: Secondary | ICD-10-CM | POA: Diagnosis present

## 2014-10-12 DIAGNOSIS — I4581 Long QT syndrome: Secondary | ICD-10-CM | POA: Diagnosis present

## 2014-10-12 DIAGNOSIS — E876 Hypokalemia: Secondary | ICD-10-CM | POA: Diagnosis present

## 2014-10-12 DIAGNOSIS — I482 Chronic atrial fibrillation: Secondary | ICD-10-CM | POA: Diagnosis present

## 2014-10-12 DIAGNOSIS — I361 Nonrheumatic tricuspid (valve) insufficiency: Secondary | ICD-10-CM | POA: Diagnosis present

## 2014-10-12 DIAGNOSIS — R55 Syncope and collapse: Secondary | ICD-10-CM | POA: Diagnosis not present

## 2014-10-12 DIAGNOSIS — K219 Gastro-esophageal reflux disease without esophagitis: Secondary | ICD-10-CM | POA: Diagnosis not present

## 2014-10-12 DIAGNOSIS — Z955 Presence of coronary angioplasty implant and graft: Secondary | ICD-10-CM | POA: Diagnosis not present

## 2014-10-12 DIAGNOSIS — I10 Essential (primary) hypertension: Secondary | ICD-10-CM | POA: Diagnosis present

## 2014-10-12 DIAGNOSIS — R531 Weakness: Secondary | ICD-10-CM | POA: Diagnosis not present

## 2014-10-12 DIAGNOSIS — Z888 Allergy status to other drugs, medicaments and biological substances status: Secondary | ICD-10-CM

## 2014-10-12 DIAGNOSIS — E782 Mixed hyperlipidemia: Secondary | ICD-10-CM

## 2014-10-12 DIAGNOSIS — Z90710 Acquired absence of both cervix and uterus: Secondary | ICD-10-CM | POA: Diagnosis not present

## 2014-10-12 DIAGNOSIS — F419 Anxiety disorder, unspecified: Secondary | ICD-10-CM | POA: Diagnosis present

## 2014-10-12 DIAGNOSIS — T502X5A Adverse effect of carbonic-anhydrase inhibitors, benzothiadiazides and other diuretics, initial encounter: Secondary | ICD-10-CM | POA: Diagnosis present

## 2014-10-12 DIAGNOSIS — M1990 Unspecified osteoarthritis, unspecified site: Secondary | ICD-10-CM | POA: Diagnosis present

## 2014-10-12 DIAGNOSIS — Z6828 Body mass index (BMI) 28.0-28.9, adult: Secondary | ICD-10-CM | POA: Diagnosis not present

## 2014-10-12 DIAGNOSIS — Z7982 Long term (current) use of aspirin: Secondary | ICD-10-CM

## 2014-10-12 DIAGNOSIS — E871 Hypo-osmolality and hyponatremia: Secondary | ICD-10-CM | POA: Diagnosis present

## 2014-10-12 DIAGNOSIS — Z881 Allergy status to other antibiotic agents status: Secondary | ICD-10-CM | POA: Diagnosis not present

## 2014-10-12 DIAGNOSIS — G8929 Other chronic pain: Secondary | ICD-10-CM | POA: Diagnosis present

## 2014-10-12 DIAGNOSIS — Z79899 Other long term (current) drug therapy: Secondary | ICD-10-CM | POA: Diagnosis not present

## 2014-10-12 DIAGNOSIS — K2270 Barrett's esophagus without dysplasia: Secondary | ICD-10-CM | POA: Diagnosis present

## 2014-10-12 DIAGNOSIS — I071 Rheumatic tricuspid insufficiency: Secondary | ICD-10-CM | POA: Diagnosis present

## 2014-10-12 DIAGNOSIS — E669 Obesity, unspecified: Secondary | ICD-10-CM | POA: Diagnosis present

## 2014-10-12 DIAGNOSIS — Z8249 Family history of ischemic heart disease and other diseases of the circulatory system: Secondary | ICD-10-CM

## 2014-10-12 DIAGNOSIS — Z885 Allergy status to narcotic agent status: Secondary | ICD-10-CM | POA: Diagnosis not present

## 2014-10-12 DIAGNOSIS — Z91048 Other nonmedicinal substance allergy status: Secondary | ICD-10-CM

## 2014-10-12 LAB — I-STAT TROPONIN, ED: TROPONIN I, POC: 0.01 ng/mL (ref 0.00–0.08)

## 2014-10-12 LAB — CBC WITH DIFFERENTIAL/PLATELET
BASOS ABS: 0 10*3/uL (ref 0.0–0.1)
BASOS PCT: 1 % (ref 0–1)
Eosinophils Absolute: 0.3 10*3/uL (ref 0.0–0.7)
Eosinophils Relative: 3 % (ref 0–5)
HEMATOCRIT: 38.3 % (ref 36.0–46.0)
HEMOGLOBIN: 13.6 g/dL (ref 12.0–15.0)
LYMPHS PCT: 23 % (ref 12–46)
Lymphs Abs: 1.7 10*3/uL (ref 0.7–4.0)
MCH: 31.1 pg (ref 26.0–34.0)
MCHC: 35.5 g/dL (ref 30.0–36.0)
MCV: 87.4 fL (ref 78.0–100.0)
MONOS PCT: 9 % (ref 3–12)
Monocytes Absolute: 0.6 10*3/uL (ref 0.1–1.0)
NEUTROS ABS: 4.7 10*3/uL (ref 1.7–7.7)
NEUTROS PCT: 64 % (ref 43–77)
Platelets: 247 10*3/uL (ref 150–400)
RBC: 4.38 MIL/uL (ref 3.87–5.11)
RDW: 12.8 % (ref 11.5–15.5)
WBC: 7.3 10*3/uL (ref 4.0–10.5)

## 2014-10-12 LAB — TROPONIN I: Troponin I: <0.03 ng/mL (ref <0.031)

## 2014-10-12 LAB — BASIC METABOLIC PANEL
ANION GAP: 12 (ref 5–15)
BUN: 14 mg/dL (ref 6–20)
CALCIUM: 9.6 mg/dL (ref 8.9–10.3)
CO2: 25 mmol/L (ref 22–32)
Chloride: 95 mmol/L — ABNORMAL LOW (ref 101–111)
Creatinine, Ser: 0.96 mg/dL (ref 0.44–1.00)
GFR calc non Af Amer: 52 mL/min — ABNORMAL LOW (ref >60)
GLUCOSE: 163 mg/dL — AB (ref 65–99)
POTASSIUM: 3.2 mmol/L — AB (ref 3.5–5.1)
Sodium: 132 mmol/L — ABNORMAL LOW (ref 135–145)

## 2014-10-12 LAB — TSH: TSH: 2.393 u[IU]/mL (ref 0.350–4.500)

## 2014-10-12 LAB — MAGNESIUM: Magnesium: 1.5 mg/dL — ABNORMAL LOW (ref 1.7–2.4)

## 2014-10-12 LAB — D-DIMER, QUANTITATIVE: D-Dimer, Quant: 0.31 ug/mL-FEU (ref 0.00–0.48)

## 2014-10-12 MED ORDER — PANTOPRAZOLE SODIUM 40 MG PO TBEC
40.0000 mg | DELAYED_RELEASE_TABLET | Freq: Every day | ORAL | Status: DC
  Administered 2014-10-13: 40 mg via ORAL
  Filled 2014-10-12: qty 1

## 2014-10-12 MED ORDER — NITROGLYCERIN 0.4 MG SL SUBL: 0.4000 mg | SUBLINGUAL_TABLET | SUBLINGUAL | Status: DC | PRN

## 2014-10-12 MED ORDER — VITAMIN B-12 1000 MCG PO TABS
1000.0000 ug | ORAL_TABLET | Freq: Every day | ORAL | Status: DC
  Administered 2014-10-12 – 2014-10-13 (×2): 1000 ug via ORAL
  Filled 2014-10-12 (×2): qty 1

## 2014-10-12 MED ORDER — METOPROLOL TARTRATE 25 MG PO TABS
25.0000 mg | ORAL_TABLET | Freq: Two times a day (BID) | ORAL | Status: DC
  Administered 2014-10-12: 25 mg via ORAL
  Filled 2014-10-12: qty 1

## 2014-10-12 MED ORDER — VITAMIN D3 25 MCG (1000 UNIT) PO TABS
1000.0000 [IU] | ORAL_TABLET | Freq: Every day | ORAL | Status: DC
  Administered 2014-10-12 – 2014-10-13 (×2): 1000 [IU] via ORAL
  Filled 2014-10-12 (×4): qty 1

## 2014-10-12 MED ORDER — ASPIRIN 81 MG PO TABS: 81.0000 mg | ORAL_TABLET | Freq: Every day | ORAL | Status: DC | PRN

## 2014-10-12 MED ORDER — POTASSIUM CHLORIDE ER 10 MEQ PO TBCR
10.0000 meq | EXTENDED_RELEASE_TABLET | Freq: Every day | ORAL | Status: DC
  Administered 2014-10-12: 10 meq via ORAL
  Filled 2014-10-12 (×2): qty 1

## 2014-10-12 MED ORDER — CALCIUM CARBONATE 600 MG PO TABS: 600.0000 mg | ORAL_TABLET | Freq: Every day | ORAL | Status: DC

## 2014-10-12 MED ORDER — RIVAROXABAN 15 MG PO TABS
15.0000 mg | ORAL_TABLET | Freq: Every day | ORAL | Status: DC
  Administered 2014-10-12: 15 mg via ORAL
  Filled 2014-10-12: qty 1

## 2014-10-12 MED ORDER — VITAMIN C 500 MG PO TABS
500.0000 mg | ORAL_TABLET | Freq: Every day | ORAL | Status: DC
Start: 2014-10-12 — End: 2014-10-13
  Administered 2014-10-12 – 2014-10-13 (×2): 500 mg via ORAL
  Filled 2014-10-12 (×2): qty 1

## 2014-10-12 MED ORDER — SODIUM CHLORIDE 0.9 % IJ SOLN
3.0000 mL | Freq: Two times a day (BID) | INTRAMUSCULAR | Status: DC
  Administered 2014-10-12 – 2014-10-13 (×2): 3 mL via INTRAVENOUS

## 2014-10-12 MED ORDER — CALCIUM CARBONATE 1250 (500 CA) MG PO TABS
1.0000 | ORAL_TABLET | Freq: Every day | ORAL | Status: DC
  Administered 2014-10-12 – 2014-10-13 (×2): 500 mg via ORAL
  Filled 2014-10-12 (×3): qty 1

## 2014-10-12 MED ORDER — OXYBUTYNIN CHLORIDE 5 MG PO TABS
5.0000 mg | ORAL_TABLET | Freq: Every day | ORAL | Status: DC
Start: 2014-10-12 — End: 2014-10-13
  Administered 2014-10-12 – 2014-10-13 (×2): 5 mg via ORAL
  Filled 2014-10-12 (×2): qty 1

## 2014-10-12 MED ORDER — VITAMIN E 180 MG (400 UNIT) PO CAPS
400.0000 [IU] | ORAL_CAPSULE | Freq: Every day | ORAL | Status: DC
  Administered 2014-10-12 – 2014-10-13 (×2): 400 [IU] via ORAL
  Filled 2014-10-12 (×3): qty 1

## 2014-10-12 MED ORDER — LORAZEPAM 0.5 MG PO TABS: 0.5000 mg | ORAL_TABLET | Freq: Three times a day (TID) | ORAL | Status: DC | PRN

## 2014-10-12 MED ORDER — MAGNESIUM CHLORIDE 64 MG PO TBEC
1.0000 | DELAYED_RELEASE_TABLET | Freq: Every day | ORAL | Status: DC
  Administered 2014-10-12: 64 mg via ORAL
  Filled 2014-10-12 (×3): qty 1

## 2014-10-12 MED ORDER — ACETAMINOPHEN 500 MG PO TABS: 500.0000 mg | ORAL_TABLET | Freq: Four times a day (QID) | ORAL | Status: DC | PRN

## 2014-10-12 MED ORDER — HYDROCHLOROTHIAZIDE 25 MG PO TABS
25.0000 mg | ORAL_TABLET | Freq: Every day | ORAL | Status: DC
Start: 2014-10-12 — End: 2014-10-13
  Administered 2014-10-12 – 2014-10-13 (×2): 25 mg via ORAL
  Filled 2014-10-12 (×2): qty 1

## 2014-10-12 MED ORDER — ASPIRIN EC 81 MG PO TBEC: 81.0000 mg | DELAYED_RELEASE_TABLET | Freq: Every day | ORAL | Status: DC | PRN

## 2014-10-12 MED ORDER — POTASSIUM CHLORIDE CRYS ER 20 MEQ PO TBCR
40.0000 meq | EXTENDED_RELEASE_TABLET | Freq: Once | ORAL | Status: AC
  Administered 2014-10-12: 40 meq via ORAL
  Filled 2014-10-12: qty 2

## 2014-10-12 NOTE — ED Notes (Signed)
Patient aware of bed and POC, will call back to floor in 15 minutes for report.

## 2014-10-12 NOTE — Addendum Note (Signed)
Addended by: Devra Dopp E on: 10/12/2014 09:17 AM   Modules accepted: Orders

## 2014-10-12 NOTE — ED Provider Notes (Signed)
CSN: [id number]     Arrival date & time 10/12/14  1328 History   First MD Initiated Contact with Patient 10/12/14 1329     Chief Complaint  Patient presents with  . Loss of Consciousness     (Consider location/radiation/quality/duration/timing/severity/associated sxs/prior Treatment) HPI  81 year old female with a history of atrial fibrillation presents after syncope at a restaurant. She went and saw her cardiologist this morning for follow-up on A. fib and had no acute issues. About 1-2 minutes prior to passing out she felt lightheaded and was tried to figure out why she felt lightheaded and then the next thing she remembers EMS personnel was standing over her. She did not fall or fall out of the chair, remained sitting with family supporting her. Family states she was out for 15 minutes. She states she felt short of breath like she had been running just prior to passing out. Denies chest pain. Thinks she may have had palpitations but she's not sure. Feels lightheaded now, especially when sitting or standing. No Headache, vomiting, diarrhea or focal weakness.  Past Medical History  Diagnosis Date  . Anxiety   . Atrial fibrillation   . Hypertension   . GERD (gastroesophageal reflux disease)   . Barrett's esophagus   . Arthritis   . Coronary artery disease     a. s/p Cypher DES to LAD in 2006;  b. Last LHC 02/2008:  pLAD 30%, mLAD stent patent, pOM 50%, EF 65%;  c. Lexiscan Myoview 7/14:  Intermediate risk, dist ant and inf-lat ischemia, EF 66%  . Hx of echocardiogram     Echocardiogram 08/17/12: EF 55-60%, MAC, mild MR, mild LAE, mild RVE, mild to moderate RAE, moderate TR, mildly increased pulmonary artery systolic pressures  . Hyperlipidemia   . Hiatal hernia   . Obesity   . Hypertension    Past Surgical History  Procedure Laterality Date  . Ankle surgery    . Coronary angioplasty    . Esophagogastroduodenoscopy    . Abdominal hysterectomy    . Cesarean section    . Breast cyst  excision     Family History  Problem Relation Age of Onset  . Diabetes Mother   . Heart attack Mother   . Lung cancer Father   . Thyroid disease Sister   . Thyroid disease Sister   . Hypertension Sister   . Hypertension Sister   . Hypertension Sister   . Hypertension Sister   . Hypertension Brother   . Hypertension Brother   . Colon cancer Neg Hx   . Colon polyps Neg Hx   . Esophageal cancer Neg Hx    Social History  Substance Use Topics  . Smoking status: Former Smoker    Types: Cigarettes  . Smokeless tobacco: Never Used  . Alcohol Use: No   OB History    No data available     Review of Systems  Constitutional: Negative for fever.  Respiratory: Positive for shortness of breath.   Cardiovascular: Negative for chest pain.  Gastrointestinal: Negative for vomiting and diarrhea.  Neurological: Positive for syncope and light-headedness. Negative for weakness and headaches.  All other systems reviewed and are negative.     Allergies  Actonel; Ciprofloxacin hcl; Hydralazine hcl; Lisinopril; Oxycodone hcl; and Valsartan  Home Medications   Prior to Admission medications   Medication Sig Start Date End Date Taking? Authorizing Provider  aspirin 81 MG tablet Take 81 mg by mouth daily as needed for pain or fever.  Historical Provider, MD  calcium carbonate (OS-CAL) 600 MG TABS Take 600 mg by mouth daily.    Historical Provider, MD  Cholecalciferol (VITAMIN D-3) 1000 UNITS CAPS Take 1 capsule by mouth daily.    Historical Provider, MD  hydrochlorothiazide (HYDRODIURIL) 25 MG tablet Take 25 mg by mouth daily.  12/07/12   Historical Provider, MD  LORazepam (ATIVAN) 0.5 MG tablet Take 1 tablet (0.5 mg total) by mouth every 8 (eight) hours as needed for anxiety. 10/12/14   Josue Hector, MD  magnesium chloride (SLOW-MAG) 64 MG TBEC Take 1 tablet by mouth at bedtime. 08/18/12   Janece Canterbury, MD  metoprolol tartrate (LOPRESSOR) 25 MG tablet Take 25 mg by mouth 2 (two) times  daily.    Historical Provider, MD  nitroGLYCERIN (NITROSTAT) 0.4 MG SL tablet Place 1 tablet (0.4 mg total) under the tongue every 5 (five) minutes as needed for chest pain (3 doses only). 10/12/14   Josue Hector, MD  omeprazole (PRILOSEC) 20 MG capsule Take 20 mg by mouth daily.    Historical Provider, MD  oxybutynin (DITROPAN) 5 MG tablet Take 5 mg by mouth daily.    Historical Provider, MD  potassium chloride (K-DUR) 10 MEQ tablet Take 10 mEq by mouth at bedtime.  08/09/12   Historical Provider, MD  Rivaroxaban (XARELTO) 15 MG TABS tablet Take 1 tablet (15 mg total) by mouth daily. 09/28/12   Josue Hector, MD  vitamin B-12 (CYANOCOBALAMIN) 1000 MCG tablet Take 1,000 mcg by mouth daily.    Historical Provider, MD  vitamin C (ASCORBIC ACID) 500 MG tablet Take 500 mg by mouth daily.    Historical Provider, MD  vitamin E 400 UNIT capsule Take 400 Units by mouth daily.    Historical Provider, MD   BP 142/65 mmHg  Pulse 70  Temp(Src) 98.6 F (37 C) (Oral)  Resp 18  SpO2 100% Physical Exam  Constitutional: She is oriented to person, place, and time. She appears well-developed and well-nourished.  HENT:  Head: Normocephalic and atraumatic.  Right Ear: External ear normal.  Left Ear: External ear normal.  Nose: Nose normal.  Eyes: EOM are normal. Pupils are equal, round, and reactive to light. Right eye exhibits no discharge. Left eye exhibits no discharge.  Cardiovascular: Normal rate and normal heart sounds.  An irregular rhythm present.  Pulmonary/Chest: Effort normal and breath sounds normal.  Abdominal: Soft. There is no tenderness.  Neurological: She is alert and oriented to person, place, and time.  CN 2-12 grossly intact. Diffusely weak to strength testing but is equal in all 4 extremities. Able to lift all 4 extremities off bed and hold them there.  Skin: Skin is warm and dry.  Nursing note and vitals reviewed.   ED Course  Procedures (including critical care time) Labs  Review Labs Reviewed  BASIC METABOLIC PANEL - Abnormal; Notable for the following:    Sodium 132 (*)    Potassium 3.2 (*)    Chloride 95 (*)    Glucose, Bld 163 (*)    GFR calc non Af Amer 52 (*)    All other components within normal limits  CBC WITH DIFFERENTIAL/PLATELET  D-DIMER, QUANTITATIVE (NOT AT [facility])  I-STAT TROPOININ, ED    Imaging Review No results found. I have personally reviewed and evaluated these images and lab results as part of my medical decision-making.   EKG Interpretation   Date/Time:  Thursday October 12 2014 13:36:52 EDT Ventricular Rate:  71 PR Interval:  QRS Duration: 107 QT Interval:  506 QTC Calculation: 550 R Axis:   -51 Text Interpretation:  Atrial fibrillation LAD, consider left anterior  fascicular block Abnormal R-wave progression, early transition Probable  anteroseptal infarct, old Repol abnrm suggests ischemia, anterolateral  Prolonged QT interval Afib is not new Confirmed by Nakaila Freeze  MD, Azadeh Hyder  ([id number]) on 10/12/2014 1:56:05 PM      MDM   Final diagnoses:  Syncope and collapse    Patient with concerning syncope, likely atrial fibrillation with RVR. Currently hemodynamically stable. No evidence of ACS at this time although she did have concerning shortness of breath. At this point I've consulted cardiology and the hospitalist, patient will be admitted for telemetry monitoring. No injuries, headaches, or focal neurologic symptoms.    Sherwood Gambler, MD 10/12/14 [phone]

## 2014-10-12 NOTE — H&P (Signed)
[facility] History and Physical                                                                                    [name], is a 81 y.o. female  MRN: [id number]   DOB - 04/11/1926  Admit Date - 10/12/2014  Outpatient Primary MD for the patient is Merrilee Seashore, MD  Referring MD: Regenia Skeeter / ER  Consulting M.D: [facility]  With History of -  Past Medical History  Diagnosis Date  . Anxiety   . Atrial fibrillation   . Hypertension   . GERD (gastroesophageal reflux disease)   . Barrett's esophagus   . Arthritis   . Coronary artery disease     a. s/p Cypher DES to LAD in 2006;  b. Last LHC 02/2008:  pLAD 30%, mLAD stent patent, pOM 50%, EF 65%;  c. Lexiscan Myoview 7/14:  Intermediate risk, dist ant and inf-lat ischemia, EF 66%  . Hx of echocardiogram     Echocardiogram 08/17/12: EF 55-60%, MAC, mild MR, mild LAE, mild RVE, mild to moderate RAE, moderate TR, mildly increased pulmonary artery systolic pressures  . Hyperlipidemia   . Hiatal hernia   . Obesity   . Hypertension       Past Surgical History  Procedure Laterality Date  . Ankle surgery    . Coronary angioplasty    . Esophagogastroduodenoscopy    . Abdominal hysterectomy    . Cesarean section    . Breast cyst excision      Chief Complaint  Patient presents with  . Loss of Consciousness     HPI This is a 81 year old female patient with a past medical history of CAD and prior stent to the LAD in 2006 (last cath was in 2010), chronic atrial fibrillation on Xarelto,  history of recurrent hypokalemia and hyponatremia in setting of thiazide diuretic therapy, hypertension, GERD, and dyslipidemia who was sent to the ER after a witnessed episode of syncope with collapse. Patient was sitting at a restaurant when she began having a pressure sensation behind her eyes, she described this as feeling "crappy", she then developed blurred vision and at that point her memory becomes foggy. Her  daughter who was sitting with her at the table and noticed a funny look on her mother's face; she was glassy and appeared to not be aware of her surroundings. Subsequently she slumped forward but her eyes remained open, within about 1-2 minute she was completely out and unconscious although she continued with a pulse and respiratory effort. EMS was called to the scene and the patient was still unconscious upon their arrival. By the time the patient arrived to the hospital she was awake and began to describe a pressure sensation in her chest like she could not breathe. She has had no awareness of any tachypalpitations either prior to the onset of the symptoms or after she awakened. She reports similar symptoms (syncope) when her CAD was diagnosed/prior stent placement. She was recently evaluated by her cardiologist on 8/23 and it has been previously documented that at those visits she has expressed desire not to pursue cardiac catheterization or cardioversion. Except for her  symptoms today patient has been in her usual state of health with no issues. She does report that she periodically takes one Tylenol a day for some chronic pain and was told by her primary care physician then if she took the Tylenol not to take her 81 mg dose of aspirin because she is taking Eliquis.  In the ER she was afebrile, maintaining atrial fibrillation with controlled ventricular response of 70 bpm, blood pressure was 142/65. Orthostatic vital signs were checked and they were normal. Room air saturations were 100%. An EKG was checked which revealed atrial fibrillation with a QTC of 550 ms. Sodium was low at 132 with potassium of 3.2, otherwise renal function normal, glucose slightly elevated at 163, troponin normal at 0.01, CBC normal, d-dimer normal 0.31. Chest x-ray was unremarkable. Patient without any focal neurological deficits and alert and oriented upon arrival to the ER so CT of the head was not ordered.   Review of Systems     In addition to the HPI above,  No Fever-chills, myalgias or other constitutional symptoms No Headache, changes with Vision or hearing, new weakness, tingling, numbness in any extremity, No problems swallowing food or Liquids, indigestion/reflux No Cough or palpitations, orthopnea or DOE No Abdominal pain, N/V; no melena or hematochezia, no dark tarry stools, Bowel movements are regular, No dysuria, hematuria or flank pain No new skin rashes, lesions, masses or bruises, No new joints pains-aches No recent weight gain or loss No polyuria, polydypsia or polyphagia,  *A full 10 point Review of Systems was done, except as stated above, all other Review of Systems were negative.  Social History Social History  Substance Use Topics  . Smoking status: Former Smoker    Types: Cigarettes  . Smokeless tobacco: Never Used  . Alcohol Use: No    Resides at: Private residence  Lives with: Alone  Ambulatory status: Without assistive devices   Family History Family History  Problem Relation Age of Onset  . Diabetes Mother   . Heart attack Mother   . Lung cancer Father   . Thyroid disease Sister   . Thyroid disease Sister   . Hypertension Sister   . Hypertension Sister   . Hypertension Sister   . Hypertension Sister   . Hypertension Brother   . Hypertension Brother   . Colon cancer Neg Hx   . Colon polyps Neg Hx   . Esophageal cancer Neg Hx      Prior to Admission medications   Medication Sig Start Date End Date Taking? Authorizing Provider  acetaminophen (TYLENOL) 500 MG tablet Take 500 mg by mouth every 6 (six) hours as needed for mild pain.   Yes Historical Provider, MD  aspirin 81 MG tablet Take 81 mg by mouth daily as needed for pain or fever.    Yes Historical Provider, MD  calcium carbonate (OS-CAL) 600 MG TABS Take 600 mg by mouth daily.   Yes Historical Provider, MD  Cholecalciferol (VITAMIN D-3) 1000 UNITS CAPS Take 1 capsule by mouth daily.   Yes Historical Provider,  MD  hydrochlorothiazide (HYDRODIURIL) 25 MG tablet Take 25 mg by mouth daily.  12/07/12  Yes Historical Provider, MD  LORazepam (ATIVAN) 0.5 MG tablet Take 1 tablet (0.5 mg total) by mouth every 8 (eight) hours as needed for anxiety. 10/12/14  Yes Josue Hector, MD  magnesium chloride (SLOW-MAG) 64 MG TBEC Take 1 tablet by mouth at bedtime. 08/18/12  Yes Janece Canterbury, MD  metoprolol tartrate (LOPRESSOR) 25 MG tablet  Take 25 mg by mouth 2 (two) times daily.   Yes Historical Provider, MD  nitroGLYCERIN (NITROSTAT) 0.4 MG SL tablet Place 1 tablet (0.4 mg total) under the tongue every 5 (five) minutes as needed for chest pain (3 doses only). 10/12/14  Yes Josue Hector, MD  omeprazole (PRILOSEC) 20 MG capsule Take 20 mg by mouth daily.   Yes Historical Provider, MD  oxybutynin (DITROPAN) 5 MG tablet Take 5 mg by mouth daily.   Yes Historical Provider, MD  potassium chloride (K-DUR) 10 MEQ tablet Take 10 mEq by mouth at bedtime.  08/09/12  Yes Historical Provider, MD  Rivaroxaban (XARELTO) 15 MG TABS tablet Take 1 tablet (15 mg total) by mouth daily. 09/28/12  Yes Josue Hector, MD  vitamin B-12 (CYANOCOBALAMIN) 1000 MCG tablet Take 1,000 mcg by mouth daily.   Yes Historical Provider, MD  vitamin C (ASCORBIC ACID) 500 MG tablet Take 500 mg by mouth daily.   Yes Historical Provider, MD  vitamin E 400 UNIT capsule Take 400 Units by mouth daily.   Yes Historical Provider, MD    Allergies  Allergen Reactions  . Actonel [Risedronate Sodium] Other (See Comments)    Caused a lump to come up in throat  . Ciprofloxacin Hcl     hallucinations  . Hydralazine Hcl     Unknown, per pt   . Lisinopril Cough  . Oxycodone Hcl     hallucinations  . Tape Itching  . Valsartan     Unknown, per pt    Physical Exam  Vitals  Blood pressure 148/59, pulse 74, temperature 98.6 F (37 C), temperature source Oral, resp. rate 18, SpO2 100 %.   General:  In no acute distress, appears healthy, well nourished and  younger than stated age  Psych:  Normal affect, Denies Suicidal or Homicidal ideations, Awake Alert, Oriented X 3. Speech and thought patterns are clear and appropriate, no apparent short term memory deficits  Neuro:   No focal neurological deficits, CN II through XII intact, Strength 5/5 all 4 extremities, Sensation intact all 4 extremities.  ENT:  Ears and Eyes appear Normal, Conjunctivae clear, PER. Moist oral mucosa without erythema or exudates.  Neck:  Supple, No lymphadenopathy appreciated  Respiratory:  Symmetrical chest wall movement, Good air movement bilaterally, CTAB. Room Air  Cardiac: Regular with atrial fibrillation, No Murmurs, no LE edema noted, no JVD, No carotid bruits, peripheral pulses palpable at 2+  Abdomen:  Positive bowel sounds, Soft, Non tender, Non distended,  No masses appreciated, no obvious hepatosplenomegaly  Skin:  No Cyanosis, Normal Skin Turgor, No Skin Rash or Bruise.  Extremities: Symmetrical without obvious trauma or injury,  no effusions.  Data Review  CBC  Recent Labs Lab 10/12/14 1415  WBC 7.3  HGB 13.6  HCT 38.3  PLT 247  MCV 87.4  MCH 31.1  MCHC 35.5  RDW 12.8  LYMPHSABS 1.7  MONOABS 0.6  EOSABS 0.3  BASOSABS 0.0    Chemistries   Recent Labs Lab 10/12/14 1415  NA 132*  K 3.2*  CL 95*  CO2 25  GLUCOSE 163*  BUN 14  CREATININE 0.96  CALCIUM 9.6   Coagulation profile No results for input(s): INR, PROTIME in the last 168 hours.   Recent Labs  10/12/14 1415  DDIMER 0.31    Cardiac Enzymes No results for input(s): CKMB, TROPONINI, MYOGLOBIN in the last 168 hours.  Invalid input(s): CK  Invalid input(s): POCBNP  Urinalysis    Component Value  Date/Time   COLORURINE AMBER* 08/17/2012 1829   APPEARANCEUR CLOUDY* 08/17/2012 1829   LABSPEC 1.011 08/17/2012 1829   PHURINE 7.0 08/17/2012 1829   GLUCOSEU NEGATIVE 08/17/2012 1829   HGBUR TRACE* 08/17/2012 1829   BILIRUBINUR NEGATIVE 08/17/2012 1829   KETONESUR  NEGATIVE 08/17/2012 [place] NEGATIVE 08/17/2012 1829   UROBILINOGEN 0.2 08/17/2012 1829   NITRITE NEGATIVE 08/17/2012 1829   LEUKOCYTESUR LARGE* 08/17/2012 1829    Imaging results:   Dg Chest Port 1 View  10/12/2014   CLINICAL DATA:  Atrial fibrillation.  Syncope today.  Hypertension.  EXAM: PORTABLE CHEST - 1 VIEW  COMPARISON:  08/16/2012.  FINDINGS: The cardiac silhouette remains borderline enlarged. Stable left lung calcified granuloma. Otherwise, clear lungs. Mild to moderate dextroconvex thoracic scoliosis.  IMPRESSION: No acute abnormality.   Electronically Signed   By: Claudie Revering M.D.   On: 10/12/2014 14:24     EKG: (Independently reviewed) atrial fibrillation with ventricular response 71 bpm, septal leads with new abnormal R-wave progression, QTC prolonged at 550 ms which is new, no overt ST segment changes were T-wave changes that would be concerning for ischemia   Assessment & Plan Syncope and collapse -Admit to telemetry -Cardiology consulted -Symptoms similar to prior ischemic equivalent and presentation for CAD and past -Uncertain if underlying arrhythmia contributed especially with new finding QT prolongation -Repeat echocardiogram -Ambulate with assistance  Coronary atherosclerosis/stent to LAD 2006 -Cardiology consulted -Cycle enzymes and echocardiogram as above -Patient previously has stated does not wish to pursue additional cardiac cath procedures    Atrial fibrillation/QT prolongation -Atrial fibrillation currently rate controlled -QT prolongation is new; cardiology plans to adjust beta blockers -Patient has previously stated does not wish to pursue cardioversion procedure -Continue Xarelto -Repeat EKG in a.m.      Hypertension -Continue Hydrodiuril and Lopressor    Hypokalemia/Hyponatremia -Suspect related to use of thiazide diuretics since appears to be a chronic intermittent recurring problem -Given QT prolongation prefer to keep potassium  greater than 4; will replete as needed -Has been given 40 mEq of potassium in ER -Follow labs    Mixed hyperlipidemia -Not on statin therapy    GERD -History of Barrett's esophagitis -Currently no reflux symptoms -cont new PPI   DVT Prophylaxis: Xarelto  Family Communication:  Daughter at bedside   Code Status:  Full code  Condition: Stable  Discharge disposition: Anticipate discharge back to home pending ischemic evaluation/syncope workup  Time spent in minutes : [place]    10/12/2014 at 4:45 PM  Between 7am to 7pm - Pager - [phone]  After 7pm go to [email or web address] - password [email or web address]  And look for the night coverage person covering me after hours  [facility]

## 2014-10-12 NOTE — Patient Instructions (Signed)
Your physician wants you to follow-up in:  6 MONTHS WITH DR NISHAN  You will receive a reminder letter in the mail two months in advance. If you don't receive a letter, please call our office to schedule the follow-up appointment. Your physician recommends that you continue on your current medications as directed. Please refer to the Current Medication list given to you today. 

## 2014-10-12 NOTE — ED Notes (Signed)
Pt presents to department via [facility] for evaluation of syncope. Pt finished eating lunch, family states she passed out and was unresponsive. A-fib on monitor per [facility]. CBG 122. Upon arrival pt is alert, answering questions appropriately. Unable to recall exact events. Denies pain. 20g LAC.

## 2014-10-12 NOTE — Consult Note (Signed)
CARDIOLOGY CONSULT NOTE   Patient ID: [name] MRN: [id number] DOB/AGE: 79-Dec-1928 79 y.o.  Admit date: 10/12/2014  Primary Physician   Merrilee Seashore, MD Primary Cardiologist   Dr. [name] Reason for Consultation   Syncope  HPI: [name] is a 81 y.o.  female with a hx of CAD, s/p Cypher DES to LAD in 2006, HTN, syncope, HL, GERD,  vertigo on antivert who was brought to ED by EMS after episode of Syncope. Patient was seen by Dr. [name] earlier today.   Last LHC 02/2008: pLAD 30%, mLAD stent patent, pOM 50%. Hx of chronic afib since 2014 on Xarelto for anticoagulation. Rate well controlled on lopressor 25mg  BID.   Hx of syncope. First episode on 2006, at that time she had a LAD stent placement. 08/2012 she was admitted with near syncope and found to be due to dehydration and orthostatic.   Echocardiogram 08/17/12: EF 55-60%, MAC, mild MR, mild LAE, mild RVE, mild to moderate RAE, moderate TR, mildly increased pulmonary artery systolic pressures. Carotid US 7/14: < 39% bilat ICA.   Dr. [name] has discussed  [facility] and patient did not want to have it. She was doing well when seen by Dr. [name] earlier this morning. About 1-2 minutes prior to passing out at restaurant after lunch, she felt lightheaded and was tried to figure out why she felt lightheaded and then the next thing she remembers EMS personnel was standing over her. She did not fall or fall out of the chair, remained sitting with family supporting her. Family states she was out for 15 minutes. She states that she felt short of breath  prior to passing out. Denies chest pain, palpitation, orthopnea, PND, LE edema, melena or bright red blood per rectum.  In ED, she is A&O. EKG showed afib at rate of 71bpm, QT/QTc 506/550 ms. POC trop 0.01, D-dimer 0.31, K of 3.2, CXR without acute abnormality.   History of Barrett's saw Dr Henrene Pastor and he advised continuing prilosec and no need for EGD given age and comorbidities . Primary  limitations are knee pain.Has sick son inlaw at home with DM,CRF and non healing ulcers.    Past Medical History  Diagnosis Date  . Anxiety   . Atrial fibrillation   . Hypertension   . GERD (gastroesophageal reflux disease)   . Barrett's esophagus   . Arthritis   . Coronary artery disease     a. s/p Cypher DES to LAD in 2006;  b. Last LHC 02/2008:  pLAD 30%, mLAD stent patent, pOM 50%, EF 65%;  c. Lexiscan Myoview 7/14:  Intermediate risk, dist ant and inf-lat ischemia, EF 66%  . Hx of echocardiogram     Echocardiogram 08/17/12: EF 55-60%, MAC, mild MR, mild LAE, mild RVE, mild to moderate RAE, moderate TR, mildly increased pulmonary artery systolic pressures  . Hyperlipidemia   . Hiatal hernia   . Obesity   . Hypertension      Past Surgical History  Procedure Laterality Date  . Ankle surgery    . Coronary angioplasty    . Esophagogastroduodenoscopy    . Abdominal hysterectomy    . Cesarean section    . Breast cyst excision      Allergies  Allergen Reactions  . Actonel [Risedronate Sodium] Other (See Comments)    Caused a lump to come up in throat  . Ciprofloxacin Hcl     hallucinations  . Hydralazine Hcl     Unknown, per pt   .  Lisinopril Cough  . Oxycodone Hcl     hallucinations  . Tape Itching  . Valsartan     Unknown, per pt    I have reviewed the patient's current medications     Prior to Admission medications   Medication Sig Start Date End Date Taking? Authorizing Provider  acetaminophen (TYLENOL) 500 MG tablet Take 500 mg by mouth every 6 (six) hours as needed for mild pain.   Yes Historical Provider, MD  aspirin 81 MG tablet Take 81 mg by mouth daily as needed for pain or fever.    Yes Historical Provider, MD  calcium carbonate (OS-CAL) 600 MG TABS Take 600 mg by mouth daily.   Yes Historical Provider, MD  Cholecalciferol (VITAMIN D-3) 1000 UNITS CAPS Take 1 capsule by mouth daily.   Yes Historical Provider, MD  hydrochlorothiazide (HYDRODIURIL) 25  MG tablet Take 25 mg by mouth daily.  12/07/12  Yes Historical Provider, MD  LORazepam (ATIVAN) 0.5 MG tablet Take 1 tablet (0.5 mg total) by mouth every 8 (eight) hours as needed for anxiety. 10/12/14  Yes Josue Hector, MD  magnesium chloride (SLOW-MAG) 64 MG TBEC Take 1 tablet by mouth at bedtime. 08/18/12  Yes Janece Canterbury, MD  metoprolol tartrate (LOPRESSOR) 25 MG tablet Take 25 mg by mouth 2 (two) times daily.   Yes Historical Provider, MD  nitroGLYCERIN (NITROSTAT) 0.4 MG SL tablet Place 1 tablet (0.4 mg total) under the tongue every 5 (five) minutes as needed for chest pain (3 doses only). 10/12/14  Yes Josue Hector, MD  omeprazole (PRILOSEC) 20 MG capsule Take 20 mg by mouth daily.   Yes Historical Provider, MD  oxybutynin (DITROPAN) 5 MG tablet Take 5 mg by mouth daily.   Yes Historical Provider, MD  potassium chloride (K-DUR) 10 MEQ tablet Take 10 mEq by mouth at bedtime.  08/09/12  Yes Historical Provider, MD  Rivaroxaban (XARELTO) 15 MG TABS tablet Take 1 tablet (15 mg total) by mouth daily. 09/28/12  Yes Josue Hector, MD  vitamin B-12 (CYANOCOBALAMIN) 1000 MCG tablet Take 1,000 mcg by mouth daily.   Yes Historical Provider, MD  vitamin C (ASCORBIC ACID) 500 MG tablet Take 500 mg by mouth daily.   Yes Historical Provider, MD  vitamin E 400 UNIT capsule Take 400 Units by mouth daily.   Yes Historical Provider, MD     Social History   Social History  . Marital Status: Widowed    Spouse Name: N/A  . Number of Children: 1  . Years of Education: N/A   Occupational History  . Retired    Social History Main Topics  . Smoking status: Former Smoker    Types: Cigarettes  . Smokeless tobacco: Never Used  . Alcohol Use: No  . Drug Use: No  . Sexual Activity: No   Other Topics Concern  . Not on file   Social History Narrative    Family Status  Relation Status Death Age  . Mother Deceased   . Father Deceased   . Sister Alive   . Sister Alive   . Sister Alive   . Sister  Alive   . Sister Alive   . Sister Alive   . Brother Alive   . Brother Alive   . Maternal Grandmother Deceased   . Maternal Grandfather Deceased   . Paternal Grandmother Deceased   . Paternal Grandfather Deceased    Family History  Problem Relation Age of Onset  . Diabetes Mother   .  Heart attack Mother   . Lung cancer Father   . Thyroid disease Sister   . Thyroid disease Sister   . Hypertension Sister   . Hypertension Sister   . Hypertension Sister   . Hypertension Sister   . Hypertension Brother   . Hypertension Brother   . Colon cancer Neg Hx   . Colon polyps Neg Hx   . Esophageal cancer Neg Hx      ROS:  Full 14 point review of systems complete and found to be negative unless listed above.  Physical Exam: Blood pressure 148/59, pulse 74, temperature 98.6 F (37 C), temperature source Oral, resp. rate 18, SpO2 100 %.  General: Well developed, well nourished, female in no acute distress Head: Eyes PERRLA, No xanthomas. Normocephalic and atraumatic, oropharynx without edema or exudate.  Lungs: Resp regular and unlabored. Faint R base rales.  Heart: irregularly irregular no s3, s4, or murmurs.   Neck: No carotid bruits. No lymphadenopathy.  Mild JVD. Abdomen: Bowel sounds present, abdomen soft and non-tender without masses or hernias noted. Msk:  No spine or cva tenderness. No weakness, no joint deformities or effusions. Extremities: No clubbing, cyanosis or edema. DP/PT/Radials 2+ and equal bilaterally. Neuro: Alert and oriented X 3. No focal deficits noted. Psych:  Good affect, responds appropriately Skin: No rashes or lesions noted.  Labs:   Lab Results  Component Value Date   WBC 7.3 10/12/2014   HGB 13.6 10/12/2014   HCT 38.3 10/12/2014   MCV 87.4 10/12/2014   PLT 247 10/12/2014   No results for input(s): INR in the last 72 hours.  Recent Labs Lab 10/12/14 1415  NA 132*  K 3.2*  CL 95*  CO2 25  BUN 14  CREATININE 0.96  CALCIUM 9.6  GLUCOSE 163*     Recent Labs  10/12/14 1417  TROPIPOC 0.01    Lab Results  Component Value Date   DDIMER 0.31 10/12/2014    Echo: 08/17/12 LV EF: 55% -  60%  ------------------------------------------------------------ Indications:   Syncope 780.2.  ------------------------------------------------------------ History:  PMH:  Atrial fibrillation. Coronary artery disease. Angina pectoris.  ------------------------------------------------------------ Study Conclusions  - Left ventricle: The cavity size was normal. Wall thickness was normal. Systolic function was normal. The estimated ejection fraction was in the range of 55% to 60%. Wall motion was normal; there were no regional wall motion abnormalities. The study is not technically sufficient to allow evaluation of LV diastolic function. - Mitral valve: Calcified annulus. Mild regurgitation. - Left atrium: The atrium was mildly dilated. - Right ventricle: The cavity size was mildly dilated. - Right atrium: The atrium was mildly to moderately dilated. - Tricuspid valve: Moderate regurgitation. - Pulmonary arteries: Systolic pressure was mildly increased.  ECG:   Vent. rate 71 BPM PR interval * ms QRS duration 107 ms QT/QTc 506/550 ms P-R-T axes -1 -51 68  Radiology:  Dg Chest Port 1 View  10/12/2014   CLINICAL DATA:  Atrial fibrillation.  Syncope today.  Hypertension.  EXAM: PORTABLE CHEST - 1 VIEW  COMPARISON:  08/16/2012.  FINDINGS: The cardiac silhouette remains borderline enlarged. Stable left lung calcified granuloma. Otherwise, clear lungs. Mild to moderate dextroconvex thoracic scoliosis.  IMPRESSION: No acute abnormality.   Electronically Signed   By: Claudie Revering M.D.   On: 10/12/2014 14:24    ASSESSMENT AND PLAN:    1. Syncope and collapse - Unclear etiology. She had similar episode in 2006, at that time she a LAD stent placement. - She  was doing well when seen in clinic earlier today. EKG notable  for prolonged QTC of 516ms. Tele with few pause, longest 1.7 sec. Seems inaccurate QTC as not all RR intervals are same.  - Dr. [name] has discussed cath and [facility] and patient did not want to have either. - POC trop negative. Admit to tele and cycle enzyme. Pending echo. Right now order for metoprolol is sign and held. Unable to change. Reduce her metoprolol to 12.5mg  BID.  - Echocardiogram 08/17/12: EF 55-60%, MAC, mild MR, mild LAE, mild RVE, mild to moderate RAE, moderate TR, mildly increased pulmonary artery systolic pressures. Carotid US 7/14: < 39% bilat ICA.   2. CAD  s/p Cypher DES to LAD in 2006 - Last LHC 02/2008: pLAD 30%, mLAD stent patent, pOM 50%.  - No chest pain.  3. ChronicAtrial fibrillation - CHADS2-VASc=5 (Age, sex, HTN, vascular disease). Rate is controlled. - Continue low dose Xarelto given age. Patient does not was [facility]. - Echo as above.    4. Hypertension - Relatively stable on current regimen  5. Hypokalemia 6. Hyponatremia 7. Tricuspid valve regurgitation, moderate - As above   Signed: Bhagat,Bhavinkumar, PA 10/12/2014, 4:31 PM   Co-Sign MD I have seen and examined the patient along with Bhagat,Bhavinkumar, PA.  I have reviewed the chart, notes and new data.  I agree with PA's note.  Key new complaints: feels back to normal. Symptom onset was postprandial and gradual, accompanied by flushing and diaphoresis and resolved relatively promptly after she was placed in supine position. She has had moderate pauses during atrial fibrillation while here Key examination changes: irregular rhythm, no signs of CHF  PLAN: Suspect hypotensive and/or bradycardic syncope, but not frank arrhythmic event such as asystole or VT/VF  Reduce beta blocker dose, monitor overnight. Might need to consider stopping beta blocker altogether She does not want a pacemaker or any other aggressive interventions  Sanda Klein, MD, [facility] [phone] 10/12/2014, 6:44  PM

## 2014-10-12 NOTE — ED Notes (Signed)
EDP at the bedside.  ?

## 2014-10-13 ENCOUNTER — Inpatient Hospital Stay (HOSPITAL_COMMUNITY): Payer: Medicare Other

## 2014-10-13 ENCOUNTER — Other Ambulatory Visit: Payer: Self-pay | Admitting: Cardiology

## 2014-10-13 DIAGNOSIS — I482 Chronic atrial fibrillation, unspecified: Secondary | ICD-10-CM

## 2014-10-13 DIAGNOSIS — R55 Syncope and collapse: Secondary | ICD-10-CM | POA: Diagnosis not present

## 2014-10-13 DIAGNOSIS — I4581 Long QT syndrome: Secondary | ICD-10-CM

## 2014-10-13 DIAGNOSIS — K2270 Barrett's esophagus without dysplasia: Secondary | ICD-10-CM | POA: Diagnosis not present

## 2014-10-13 DIAGNOSIS — E871 Hypo-osmolality and hyponatremia: Secondary | ICD-10-CM | POA: Diagnosis not present

## 2014-10-13 DIAGNOSIS — E876 Hypokalemia: Secondary | ICD-10-CM

## 2014-10-13 DIAGNOSIS — I361 Nonrheumatic tricuspid (valve) insufficiency: Secondary | ICD-10-CM | POA: Diagnosis not present

## 2014-10-13 LAB — TROPONIN I: Troponin I: <0.03 ng/mL (ref <0.031)

## 2014-10-13 LAB — BASIC METABOLIC PANEL
Anion gap: 8 (ref 5–15)
BUN: 11 mg/dL (ref 6–20)
CALCIUM: 9.2 mg/dL (ref 8.9–10.3)
CO2: 30 mmol/L (ref 22–32)
CREATININE: 0.91 mg/dL (ref 0.44–1.00)
Chloride: 96 mmol/L — ABNORMAL LOW (ref 101–111)
GFR calc non Af Amer: 56 mL/min — ABNORMAL LOW (ref >60)
Glucose, Bld: 101 mg/dL — ABNORMAL HIGH (ref 65–99)
Potassium: 3.5 mmol/L (ref 3.5–5.1)
SODIUM: 134 mmol/L — AB (ref 135–145)

## 2014-10-13 LAB — CBC
HCT: 33.8 % — ABNORMAL LOW (ref 36.0–46.0)
Hemoglobin: 11.7 g/dL — ABNORMAL LOW (ref 12.0–15.0)
MCH: 30.3 pg (ref 26.0–34.0)
MCHC: 34.6 g/dL (ref 30.0–36.0)
MCV: 87.6 fL (ref 78.0–100.0)
PLATELETS: 238 10*3/uL (ref 150–400)
RBC: 3.86 MIL/uL — AB (ref 3.87–5.11)
RDW: 13.1 % (ref 11.5–15.5)
WBC: 7.2 10*3/uL (ref 4.0–10.5)

## 2014-10-13 LAB — GLUCOSE, CAPILLARY: Glucose-Capillary: 102 mg/dL — ABNORMAL HIGH (ref 65–99)

## 2014-10-13 MED ORDER — METOPROLOL TARTRATE 12.5 MG HALF TABLET
12.5000 mg | ORAL_TABLET | Freq: Two times a day (BID) | ORAL | Status: DC
  Administered 2014-10-13: 12.5 mg via ORAL
  Filled 2014-10-13: qty 1

## 2014-10-13 MED ORDER — MAGNESIUM CHLORIDE 64 MG PO TBEC
1.0000 | DELAYED_RELEASE_TABLET | Freq: Two times a day (BID) | ORAL | Status: DC
  Administered 2014-10-13: 64 mg via ORAL
  Filled 2014-10-13 (×2): qty 1

## 2014-10-13 MED ORDER — MAGNESIUM CHLORIDE 64 MG PO TBEC: 1.0000 | DELAYED_RELEASE_TABLET | Freq: Two times a day (BID) | ORAL | Status: DC

## 2014-10-13 MED ORDER — POTASSIUM CHLORIDE CRYS ER 20 MEQ PO TBCR: 20.0000 meq | EXTENDED_RELEASE_TABLET | Freq: Every day | ORAL | Status: DC

## 2014-10-13 MED ORDER — POTASSIUM CHLORIDE CRYS ER 20 MEQ PO TBCR
20.0000 meq | EXTENDED_RELEASE_TABLET | Freq: Every day | ORAL | Status: DC
  Administered 2014-10-13: 20 meq via ORAL
  Filled 2014-10-13: qty 1

## 2014-10-13 MED ORDER — METOPROLOL TARTRATE 25 MG PO TABS: 12.5000 mg | ORAL_TABLET | Freq: Two times a day (BID) | ORAL | Status: DC

## 2014-10-13 NOTE — Progress Notes (Signed)
Subjective: No complaints  Objective: Vital signs in last 24 hours: Temp:  [97.3 F (36.3 C)-98.6 F (37 C)] 98 F (36.7 C) (08/26 0533) Pulse Rate:  [55-91] 55 (08/26 0533) Resp:  [18] 18 (08/26 0533) BP: (114-154)/(58-79) 136/58 mmHg (08/26 0533) SpO2:  [98 %-100 %] 99 % (08/26 0533) Weight:  [174 lb (78.926 kg)-174 lb 4.8 oz (79.062 kg)] 174 lb (78.926 kg) (08/26 0533) Weight change:  Last BM Date: 10/11/14 Intake/Output from previous day: +243   Intake/Output this shift: Total I/O In: 240 [P.O.:240] Out: -   PE: per MD  Tele: a fib rate control, some fast beats on tele but appears to be artifact.   Lab Results:  Recent Labs  10/12/14 1415 10/13/14 0258  WBC 7.3 7.2  HGB 13.6 11.7*  HCT 38.3 33.8*  PLT 247 238   BMET  Recent Labs  10/12/14 1415 10/13/14 0258  NA 132* 134*  K 3.2* 3.5  CL 95* 96*  CO2 25 30  GLUCOSE 163* 101*  BUN 14 11  CREATININE 0.96 0.91  CALCIUM 9.6 9.2    Recent Labs  10/12/14 1935 10/13/14 0258  TROPONINI <0.03 <0.03    Lab Results  Component Value Date   CHOL 194 06/03/2006   HDL 83.0 06/03/2006   LDLCALC 95 06/03/2006   TRIG 82 06/03/2006   CHOLHDL 2.3 CALC 06/03/2006   No results found for: HGBA1C   Lab Results  Component Value Date   TSH 2.393 10/12/2014      Studies/Results: Dg Chest Port 1 View  10/12/2014   CLINICAL DATA:  Atrial fibrillation.  Syncope today.  Hypertension.  EXAM: PORTABLE CHEST - 1 VIEW  COMPARISON:  08/16/2012.  FINDINGS: The cardiac silhouette remains borderline enlarged. Stable left lung calcified granuloma. Otherwise, clear lungs. Mild to moderate dextroconvex thoracic scoliosis.  IMPRESSION: No acute abnormality.   Electronically Signed   By: Claudie Revering M.D.   On: 10/12/2014 14:24    Medications: I have reviewed the patient's current medications. Scheduled Meds: . calcium carbonate  1 tablet Oral Q breakfast  . cholecalciferol  1,000 Units Oral Daily  .  hydrochlorothiazide  25 mg Oral Daily  . magnesium chloride  1 tablet Oral BID  . metoprolol tartrate  12.5 mg Oral BID  . oxybutynin  5 mg Oral Daily  . pantoprazole  40 mg Oral Daily  . potassium chloride  10 mEq Oral QHS  . Rivaroxaban  15 mg Oral Q supper  . sodium chloride  3 mL Intravenous Q12H  . vitamin B-12  1,000 mcg Oral Daily  . vitamin C  500 mg Oral Daily  . vitamin E  400 Units Oral Daily   Continuous Infusions:  PRN Meds:.acetaminophen, aspirin EC, LORazepam  Assessment/Plan: 81 y.o. female with a hx of CAD, s/p Cypher DES to LAD in 2006, HTN, syncope, HL, GERD, vertigo on antivert who was brought to ED by EMS after episode of Syncope.  Hx of chronic afib since 2014 on Xarelto for anticoagulation. Rate well controlled on lopressor 25mg  BID.   Principal Problem:   Syncope and collapse Active Problems:   Mixed hyperlipidemia   Coronary atherosclerosis   GERD   Atrial fibrillation   Hypertension   Hypokalemia   Hyponatremia   Tricuspid valve regurgitation, moderate   QT prolongation 1. Syncope and collapse - Unclear etiology. She had similar episode in 2006, at that time she a LAD stent placement. - She  was doing well when seen in clinic earlier today. EKG notable for prolonged QTC of 560ms. Tele with few pause, longest 1.7 sec. Seems inaccurate QTC as not all RR intervals are same.  - Dr. [name] has discussed cath and [facility] and patient did not want to have either. - All trop negative. Pending echo.  Unable to change. Reduced her metoprolol to 12.5mg  BID.  - Echocardiogram 08/17/12: EF 55-60%, MAC, mild MR, mild LAE, mild RVE, mild to moderate RAE, moderate TR, mildly increased pulmonary artery systolic pressures. Carotid US 7/14: < 39% bilat ICA.  -no orthostatic BP drop in ER  2. CAD s/p Cypher DES to LAD in 2006 - Last LHC 02/2008: pLAD 30%, mLAD stent patent, pOM 50%.  - No chest pain, neg troponin.  3. ChronicAtrial fibrillation - CHADS2-VASc=5 (Age, sex,  HTN, vascular disease). Rate is controlled. - Continue low dose Xarelto given age. Patient does not was [facility]. - Echo pending.   4. Hypertension - Relatively stable on current regimen  5. Hypokalemia- improved to 3.5 will give another dose 6. Hyponatremia- at 134 7. Tricuspid valve regurgitation, moderate     LOS: 1 day   Time spent with pt. :15 minutes. [facility] R  Nurse Practitioner Certified Pager [phone] or after 5pm and on weekends call [phone] 10/13/2014, 9:08 AM  I have seen and examined the patient along with [facility] R, NP.  I have reviewed the chart, notes and new data.  I agree with NP's note.  Key new complaints: feels well Key examination changes: AF with controlled rate, no significant brady events Key new findings / data: mild hypokalemia and mild hypomagnesemia may explain long QT (although it is not as severe as reported on ECG, QTC is around 470 ms).  PLAN: DC home. Echo can be done as an outpatient. F/u to reevaluate heart rate in next 2 weeks. Option to stop beta blocker altogether if necessary.   Sanda Klein, MD, [place] [phone] 10/13/2014, 10:34 AM

## 2014-10-13 NOTE — Progress Notes (Signed)
Paged on call for K+ of 3.5 with am labs.

## 2014-10-13 NOTE — Discharge Instructions (Signed)
Call if any further problems.  We decreased your lopressor (metoprolol)  to half a tablet twice a day.    Heart Healthy diet.  We scheduled an echo of your heart

## 2014-10-13 NOTE — Discharge Summary (Signed)
Physician Discharge Summary  [name] TTS:[id number] DOB: [birth year] DOA: 10/12/2014  PCP: Merrilee Seashore, MD  Admit date: 10/12/2014 Discharge date: 10/13/2014  Recommendations for Outpatient Follow-up:  1. F/u with cardiology within 2 weeks for ECHO, ECG, BP check.  Please repeat BMP to follow up potassium since her potassium supplementation was increased.  Consider risks and benefits of aspirin plus apixaban in a patient with known CAD but recurrent syncope.   2. Metoprolol dose decreased to 12.5mg  3. Potassium and magnesium supplements increased  Discharge Diagnoses:  Principal Problem:   Syncope and collapse Active Problems:   Mixed hyperlipidemia   Coronary atherosclerosis   GERD   Atrial fibrillation   Hypertension   Hypokalemia   Hyponatremia   Tricuspid valve regurgitation, moderate   QT prolongation   Discharge Condition: stable, improved  Diet recommendation: healthy heart  Wt Readings from Last 3 Encounters:  10/13/14 78.926 kg (174 lb)  10/12/14 78.926 kg (174 lb)  07/03/14 80.922 kg (178 lb 6.4 oz)    History of present illness:   This is a 81 year old female patient with a past medical history of CAD and prior stent to the LAD in 2006 (last cath was in 2010), chronic atrial fibrillation on Xarelto,history of recurrent hypokalemia and hyponatremia in setting of thiazide diuretic therapy, hypertension, GERD, and dyslipidemia who was sent to the ER after a witnessed episode of syncope with collapse. Patient was sitting at a restaurant when she began having a pressure sensation behind her eyes.  She felt flushed and nauseated.  She could tell that she was about to faint.   Her daughter reported that she slumped forward but her eyes remained open, within about 1-2 minute she was completely out and unconscious.  In the ER she was afebrile, maintaining rate controlled atrial fibrillation, blood pressure was 142/65.  Orthostatic vital signs were checked and  they were normal. Room air saturations were 100%. An EKG was checked which revealed atrial fibrillation with a QTC of 550 ms.  Sodium was low at 132 with potassium of 3.2, otherwise renal function normal, glucose slightly elevated at 163, troponin neg, CBC normal, d-dimer normal 0.31. Chest x-ray was unremarkable. She was without any focal neurological deficits and alert and oriented upon arrival to the ER so CT of the head was not ordered.  Hospital Course:   Syncope and collapse, sounds as if she had a fainting spell.  She did not have any symptoms of infection prior to her fainting episode and she remained afebrile and without leukocytosis.  UA was not checked as she did not have dysuria.  CXR demonstrated no evidence of pneumonia.  ECG was without evidence of acute ischemia and troponins were negative.  Telemetry demonstrated atrial fibrillation with occasional pauses and a prolonged QTc so Cardiology recommended reducing her metoprolol to 12.5mg .  She will need ECHO and repeat ECG as an outpatient in 1-2 weeks.  She was advised to stay hydrated and eat regularly.  If she experiences flushing with lightheadedness again, she should lie down on the ground and prop her feet above her head.    Coronary atherosclerosis/stent to LAD 2006.  Troponins were negative and ECG without acute ischemia.  She will have ECHO done as an outpatient in a few weeks.  She continued aspirin, BB.  She is not on statin presumably due to age > 68y.    Atrial fibrillation/QT prolongation, rate controlled.  Her beta blocker was reduced.  She continued xarelto for CHADS2vasc of 5.  Hypertension, blood pressure stable to mildly elevated.  She continued HCTZ and metoprolol was reduced.  Repeat BP as outpatient in 1-2 weeks.     Hypokalemia/Hyponatremia, suspect related to use of thiazide diuretics since appears to be a chronic intermittent recurring problem.  Her potassium was increased to 40meq daily from 62meq daily.   Additionally, her magnesium was low so she was started on magnesium supplementation.     Mixed hyperlipidemia, not on statin therapy possibly due to age.  Defer to cardiology.     GERD, stable, history of Barrett's esophagitis, continue PPI.  Procedures:  CXR  Consultations:  Cardiology  Discharge Exam: Filed Vitals:   10/13/14 0918  BP: 147/74  Pulse: 88  Temp:   Resp: 16   Filed Vitals:   10/12/14 2101 10/12/14 2136 10/13/14 0533 10/13/14 0918  BP: 114/65  136/58 147/74  Pulse: 77  55 88  Temp: 97.3 F (36.3 C)  98 F (36.7 C)   TempSrc: Oral  Oral   Resp: 18  18 16   Height:  5\' 5"  (1.651 m)    Weight:  79.062 kg (174 lb 4.8 oz) 78.926 kg (174 lb)   SpO2: 98%  99% 95%    General: adult female, NAD Cardiovascular: IRRR, no mrg Respiratory: CTAB ABD:  NABS, soft, ND/NT MSK:  No LEE, normal tone and bulk  Discharge Instructions      Discharge Instructions    Call MD for:  difficulty breathing, headache or visual disturbances    Complete by:  As directed      Call MD for:  extreme fatigue    Complete by:  As directed      Call MD for:  hives    Complete by:  As directed      Call MD for:  persistant dizziness or light-headedness    Complete by:  As directed      Call MD for:  persistant nausea and vomiting    Complete by:  As directed      Call MD for:  severe uncontrolled pain    Complete by:  As directed      Call MD for:  temperature >100.4    Complete by:  As directed      Diet - low sodium heart healthy    Complete by:  As directed      Discharge instructions    Complete by:  As directed   You were hospitalized with a fainting spell.  Please stay hydrated and eat regularly.  Do not skip meals.  Please decrease your metoprolol to 12.5mg .  Your potassium and magnesium were low so I have increased your potassium and magnesium doses slightly.  If you have flushing, nausea, or feel like you are going to faint, please lie down on the ground and prop your  feet above the level of your head.  This may prevent you from passing out.  Follow up with cardiology in 2 weeks for ultrasound of your heart, blood pressure check and repeat bloodwork.     Increase activity slowly    Complete by:  As directed             Medication List    STOP taking these medications        potassium chloride 10 MEQ tablet  Commonly known as:  K-DUR      TAKE these medications        acetaminophen 500 MG tablet  Commonly known as:  TYLENOL  Take  500 mg by mouth every 6 (six) hours as needed for mild pain.     aspirin 81 MG tablet  Take 81 mg by mouth daily as needed for pain or fever.     calcium carbonate 600 MG Tabs tablet  Commonly known as:  OS-CAL  Take 600 mg by mouth daily.     hydrochlorothiazide 25 MG tablet  Commonly known as:  HYDRODIURIL  Take 25 mg by mouth daily.     LORazepam 0.5 MG tablet  Commonly known as:  ATIVAN  Take 1 tablet (0.5 mg total) by mouth every 8 (eight) hours as needed for anxiety.     magnesium chloride 64 MG Tbec SR tablet  Commonly known as:  SLOW-MAG  Take 1 tablet (64 mg total) by mouth 2 (two) times daily.     metoprolol tartrate 25 MG tablet  Commonly known as:  LOPRESSOR  Take 0.5 tablets (12.5 mg total) by mouth 2 (two) times daily.     nitroGLYCERIN 0.4 MG SL tablet  Commonly known as:  NITROSTAT  Place 1 tablet (0.4 mg total) under the tongue every 5 (five) minutes as needed for chest pain (3 doses only).     omeprazole 20 MG capsule  Commonly known as:  PRILOSEC  Take 20 mg by mouth daily.     oxybutynin 5 MG tablet  Commonly known as:  DITROPAN  Take 5 mg by mouth daily.     potassium chloride SA 20 MEQ tablet  Commonly known as:  K-DUR,KLOR-CON  Take 1 tablet (20 mEq total) by mouth daily.     vitamin B-12 1000 MCG tablet  Commonly known as:  CYANOCOBALAMIN  Take 1,000 mcg by mouth daily.     vitamin C 500 MG tablet  Commonly known as:  ASCORBIC ACID  Take 500 mg by mouth daily.      Vitamin D-3 1000 UNITS Caps  Take 1 capsule by mouth daily.     vitamin E 400 UNIT capsule  Take 400 Units by mouth daily.     XARELTO 15 MG Tabs tablet  Generic drug:  Rivaroxaban  Take 1 tablet (15 mg total) by mouth daily.       Follow-up Information    Follow up with [facility] On 10/18/2014.   Specialty:  Cardiology   Why:  at 3 pm for ultrasound of your heart--echo   Contact information:   [address] [place] [phone]      Follow up with Isaiah Serge, NP On 10/25/2014.   Specialties:  Cardiology, Radiology   Why:  at 3:00PM for results of echo    Contact information:   [place] [address] [phone]       Follow up with [facility], MD. Schedule an appointment as soon as possible for a visit in 2 weeks.   Specialty:  Internal Medicine   Contact information:   [address] [place] [address] [phone]        The results of significant diagnostics from this hospitalization (including imaging, microbiology, ancillary and laboratory) are listed below for reference.    Significant Diagnostic Studies: Dg Chest Port 1 View  10/12/2014   CLINICAL DATA:  Atrial fibrillation.  Syncope today.  Hypertension.  EXAM: PORTABLE CHEST - 1 VIEW  COMPARISON:  08/16/2012.  FINDINGS: The cardiac silhouette remains borderline enlarged. Stable left lung calcified granuloma. Otherwise, clear lungs. Mild to  moderate dextroconvex thoracic scoliosis.  IMPRESSION: No acute abnormality.   Electronically Signed   By: Claudie Revering M.D.   On: 10/12/2014 14:24    Microbiology: No results found for this or any previous visit (from the past 240 hour(s)).   Labs: Basic Metabolic Panel:  Recent Labs Lab 10/12/14 1415 10/12/14 1935 10/13/14 0258  NA 132*  --  134*  K 3.2*  --  3.5  CL 95*  --  96*  CO2 25  --  30  GLUCOSE 163*  --  101*  BUN 14  --  11   CREATININE 0.96  --  0.91  CALCIUM 9.6  --  9.2  MG  --  1.5*  --    Liver Function Tests: No results for input(s): AST, ALT, ALKPHOS, BILITOT, PROT, ALBUMIN in the last 168 hours. No results for input(s): LIPASE, AMYLASE in the last 168 hours. No results for input(s): AMMONIA in the last 168 hours. CBC:  Recent Labs Lab 10/12/14 1415 10/13/14 0258  WBC 7.3 7.2  NEUTROABS 4.7  --   HGB 13.6 11.7*  HCT 38.3 33.8*  MCV 87.4 87.6  PLT 247 238   Cardiac Enzymes:  Recent Labs Lab 10/12/14 1935 10/13/14 0258 10/13/14 0830  TROPONINI <0.03 <0.03 <0.03   BNP: BNP (last 3 results) No results for input(s): BNP in the last 8760 hours.  ProBNP (last 3 results) No results for input(s): PROBNP in the last 8760 hours.  CBG:  Recent Labs Lab 10/13/14 0730  GLUCAP 102*    Time coordinating discharge: 35 minutes  Signed:  Amanee Iacovelli  [facility] 10/13/2014, 11:13 AM

## 2014-10-18 ENCOUNTER — Other Ambulatory Visit (HOSPITAL_COMMUNITY): Payer: Medicare Other

## 2014-10-19 DIAGNOSIS — I10 Essential (primary) hypertension: Secondary | ICD-10-CM | POA: Diagnosis not present

## 2014-10-19 DIAGNOSIS — I2510 Atherosclerotic heart disease of native coronary artery without angina pectoris: Secondary | ICD-10-CM | POA: Diagnosis not present

## 2014-10-19 DIAGNOSIS — R55 Syncope and collapse: Secondary | ICD-10-CM | POA: Diagnosis not present

## 2014-10-19 DIAGNOSIS — I481 Persistent atrial fibrillation: Secondary | ICD-10-CM | POA: Diagnosis not present

## 2014-10-25 ENCOUNTER — Encounter: Payer: Medicare Other | Admitting: Cardiology

## 2014-10-26 ENCOUNTER — Encounter: Payer: Self-pay | Admitting: Cardiology

## 2014-10-26 ENCOUNTER — Ambulatory Visit (INDEPENDENT_AMBULATORY_CARE_PROVIDER_SITE_OTHER): Payer: Medicare Other | Admitting: Cardiology

## 2014-10-26 VITALS — BP 160/90 | HR 110 | Ht 65.0 in | Wt 176.0 lb

## 2014-10-26 DIAGNOSIS — I482 Chronic atrial fibrillation, unspecified: Secondary | ICD-10-CM

## 2014-10-26 DIAGNOSIS — R55 Syncope and collapse: Secondary | ICD-10-CM | POA: Diagnosis not present

## 2014-10-26 NOTE — Progress Notes (Signed)
10/26/2014 [name]   1926/11/16  [id number]  Primary Physician Merrilee Seashore, MD Primary Cardiologist: Dr. [name]    Reason for Visit/CC: Post hospital follow-up for syncope  HPI:  The patient is a 63 or old female, followed by Dr. [name] who presents to clinic today for post hospital follow-up after being evaluated for syncope. Her past cardiac history is significant for CAD s/p Cypher DES to LAD in 2006, HTN, syncope, HL, GERD. Last LHC 02/2008: pLAD 30%, mLAD stent patent, pOM 50%. She had a follow-up nuclear stress test in 2014 that was abnormal. However she decided to forego cardiac catheterization and opted on medical management. She also has a history of atrial fibrillation, on Xarelto for anticoagulation. It was recommended that she undergo direct-current cardioversion however the patient refused. Her rate has been decently controlled with beta blocker therapy, however her Metroprolol was recently reduced to 12.5 mg twice a day due to issues with bradycardia/syncope.  She was recently admitted 10/10/2014 for syncope evaluation. In the ED she was afebrile without any signs of infection. UA was negative as well as chest x-ray. CT of the head was also negative. ECG was without evidence of acute ischemia and troponins were negative. Telemetry demonstrated atrial fibrillation with occasional pauses and a prolonged QTc. The longest pause was 1.7 sec. Her metoprolol was reduced to 12.5 mg. It was also recommended that she undergo an outpatient echocardiogram, which is not yet been completed.  Since discharge from the hospital she reports that she has done fairly ok. She denies any recurrent syncope but notes several occasions of near syncope. She denies any palpitations, no chest discomfort or dyspnea. Her EKG today demonstrates atrial fibrillation. Her heart rates 98 bpm. She reports that she has been fully compliant with her medications.   Current Outpatient Prescriptions    Medication Sig Dispense Refill  . acetaminophen (TYLENOL) 500 MG tablet Take 500 mg by mouth every 6 (six) hours as needed for mild pain.    Marland Kitchen aspirin 81 MG tablet Take 81 mg by mouth daily.     . calcium carbonate (OS-CAL) 600 MG TABS Take 600 mg by mouth 2 (two) times daily with a meal.     . Cholecalciferol (VITAMIN D-3) 1000 UNITS CAPS Take 1 capsule by mouth daily.    . hydrochlorothiazide (HYDRODIURIL) 25 MG tablet Take 25 mg by mouth daily.     Marland Kitchen LORazepam (ATIVAN) 0.5 MG tablet Take 1 tablet (0.5 mg total) by mouth every 8 (eight) hours as needed for anxiety. 30 tablet 3  . magnesium chloride (SLOW-MAG) 64 MG TBEC SR tablet Take 1 tablet (64 mg total) by mouth 2 (two) times daily. 60 tablet 0  . metoprolol tartrate (LOPRESSOR) 25 MG tablet Take 0.5 tablets (12.5 mg total) by mouth 2 (two) times daily. 30 tablet 6  . nitroGLYCERIN (NITROSTAT) 0.4 MG SL tablet Place 1 tablet (0.4 mg total) under the tongue every 5 (five) minutes as needed for chest pain (3 doses only). 25 tablet 4  . omeprazole (PRILOSEC) 20 MG capsule Take 20 mg by mouth daily.    Marland Kitchen oxybutynin (DITROPAN) 5 MG tablet Take 5 mg by mouth daily.    . potassium chloride SA (K-DUR,KLOR-CON) 20 MEQ tablet Take 1 tablet (20 mEq total) by mouth daily. 30 tablet 0  . Rivaroxaban (XARELTO) 15 MG TABS tablet Take 1 tablet (15 mg total) by mouth daily. 30 tablet 6  . vitamin B-12 (CYANOCOBALAMIN) 1000 MCG tablet Take 1,000 mcg by  mouth daily.    . vitamin C (ASCORBIC ACID) 500 MG tablet Take 500 mg by mouth daily.    . vitamin E 400 UNIT capsule Take 400 Units by mouth daily.     No current facility-administered medications for this visit.    Allergies  Allergen Reactions  . Actonel [Risedronate Sodium] Other (See Comments)    Caused a lump to come up in throat  . Ciprofloxacin Hcl     hallucinations  . Hydralazine Hcl     Unknown, per pt   . Lisinopril Cough  . Oxycodone Hcl     hallucinations  . Tape Itching  .  Valsartan     Unknown, per pt    Social History   Social History  . Marital Status: Widowed    Spouse Name: N/A  . Number of Children: 1  . Years of Education: N/A   Occupational History  . Retired    Social History Main Topics  . Smoking status: Former Smoker    Types: Cigarettes  . Smokeless tobacco: Never Used  . Alcohol Use: No  . Drug Use: No  . Sexual Activity: No   Other Topics Concern  . Not on file   Social History Narrative     Review of Systems: General: negative for chills, fever, night sweats or weight changes.  Cardiovascular: negative for chest pain, dyspnea on exertion, edema, orthopnea, palpitations, paroxysmal nocturnal dyspnea or shortness of breath Dermatological: negative for rash Respiratory: negative for cough or wheezing Urologic: negative for hematuria Abdominal: negative for nausea, vomiting, diarrhea, bright red blood per rectum, melena, or hematemesis Neurologic: negative for visual changes, syncope, or dizziness All other systems reviewed and are otherwise negative except as noted above.    Blood pressure 160/90, pulse 110, height 5\' 5"  (1.651 m), weight 176 lb (79.833 kg).  General appearance: alert, cooperative and no distress Neck: no carotid bruit and no JVD Lungs: clear to auscultation bilaterally Heart: regular rate and rhythm, S1, S2 normal, no murmur, click, rub or gallop Extremities: no LEE Pulses: 2+ and symmetric Skin: warm and dry Neurologic: Grossly normal  EKG chronic atrial fibrillation. Heart rate 98 bpm.  ASSESSMENT AND PLAN:   1. Syncope: recent w/u at [facility] was fairly unremarkable except for occasional pauses on telemetry, the longest being 1.7 sec. Since discharge, she denies frank syncope but continues to have near syncope. HR on low dose metoprolol is stable in the 90s. Will order an outpatient Holter monitor to assess for recurrent pauses/ bradycardia that may explain her symptoms. Continue with plans for 2D echo.    2. Atrial Fibrillation: rate is controlled on BB. She is asymptomatic and does note want to pursue DCCV. Continue Xarelto for stroke prophylaxis.   3. CAD: h/o prior coronary stenting. She denies any chest pain. 2D echo ordered give recent unexplained syncope. If any new abnormalites, may need repeat ischemic eval.   4. Prolonged QT: 550 ms during hospitalization. EKG today shows improvement to 444 ms.    PLAN  Plan for 2D echo and outpatient Holter monitor to assess for pauses/bradycardia, given recent symptoms. F/u with Dr. [name] or an APP in 2 weeks.   Lyda Jester PA-C 10/26/2014 3:43 PM

## 2014-10-26 NOTE — Patient Instructions (Addendum)
Medication Instructions:  Your physician recommends that you continue on your current medications as directed. Please refer to the Current Medication list given to you today.    Labwork: None ordered  Testing/Procedures:     Follow-Up: Your physician recommends that you schedule a follow-up appointment after the Echocardiogram to determine if any further tests are needed.  Also, need a follow-up appointment after the monitor is removed to discuss the results.  Please schedule with 1st available Extender.    Any Other Special Instructions Will Be Listed Below (If Applicable).  Your physician has recommended that you wear an heart monitor

## 2014-10-30 ENCOUNTER — Other Ambulatory Visit: Payer: Self-pay | Admitting: Cardiology

## 2014-10-30 DIAGNOSIS — R55 Syncope and collapse: Secondary | ICD-10-CM

## 2014-10-30 DIAGNOSIS — I482 Chronic atrial fibrillation, unspecified: Secondary | ICD-10-CM

## 2014-10-31 ENCOUNTER — Other Ambulatory Visit: Payer: Self-pay

## 2014-10-31 ENCOUNTER — Ambulatory Visit (INDEPENDENT_AMBULATORY_CARE_PROVIDER_SITE_OTHER): Payer: Medicare Other

## 2014-10-31 ENCOUNTER — Ambulatory Visit (HOSPITAL_COMMUNITY): Payer: Medicare Other | Attending: Cardiology

## 2014-10-31 DIAGNOSIS — R55 Syncope and collapse: Secondary | ICD-10-CM

## 2014-10-31 DIAGNOSIS — I340 Nonrheumatic mitral (valve) insufficiency: Secondary | ICD-10-CM | POA: Diagnosis not present

## 2014-10-31 DIAGNOSIS — I482 Chronic atrial fibrillation, unspecified: Secondary | ICD-10-CM

## 2014-10-31 DIAGNOSIS — I071 Rheumatic tricuspid insufficiency: Secondary | ICD-10-CM | POA: Insufficient documentation

## 2014-10-31 DIAGNOSIS — I517 Cardiomegaly: Secondary | ICD-10-CM | POA: Insufficient documentation

## 2014-10-31 DIAGNOSIS — Z87891 Personal history of nicotine dependence: Secondary | ICD-10-CM | POA: Diagnosis not present

## 2014-11-06 ENCOUNTER — Encounter: Payer: Self-pay | Admitting: Physician Assistant

## 2014-11-06 NOTE — Progress Notes (Addendum)
Cardiology Office Note Date:  11/07/2014  Patient ID:  Sonya, Pope 05/29/1926, MRN [id number] PCP:  Merrilee Seashore, MD  Cardiologist:  Dr. [name]   Chief Complaint: recent syncope  History of Present Illness: [name] is a 81 y.o. female with history of chronic atrial fibrillation, CAD s/p Cypher DES to LAD in 2006, HTN, syncope, HL, GERD who presents for follow-up of recent syncope.She has a history of syncope with first episode in 2006 with subsequent stent placement as above. In 08/2012 she had near-syncope felt due to dehydration and orthostasis.  Last LHC 02/2008: pLAD 30%, mLAD stent patent, pOM 50%. She had a follow-up nuclear stress test in 2014 that was abnormal. However she decided to forego cardiac catheterization and opted for medical management. She also has a history of chronic atrial fibrillation and is on Xarelto for anticoagulation. It was recommended that she undergo direct-current cardioversion in the past, however, the patient declined. Her rate has been decently controlled with beta blocker therapy, but her metroprolol was recently reduced during 09/2014 admission to 12.5 mg twice a day due to issues with syncope and suspected bradycardia/hypotension.  She was recently admitted 10/10/2014 for syncope evaluation. In the ED she was afebrile without any signs of infection. UA was negative as well as chest x-ray. CT of the head was also negative. ECG was without evidence of acute ischemia and troponins/d-dimer were negative. Telemetry demonstrated atrial fibrillation with occasional pauses and a prolonged QTc. The longest pause was 1.7 sec. Her metoprolol was reduced to 12.5 mg BID. Her K was 3.2-3.5 and Mg 1.5. She was seen in follow-up by Lyda Jester PA-C 10/26/14 at which time she was doing overall well, but still noting occasional episodes of near-syncope. QTc had improved to 425ms (down from 525ms). 2D echo 10/31/14 showed mild LVH, EF 55-60%, no RWMA, mod  MR, mild biatrial enlargment, mod TR, mod-severely elevated PA pressures (previously mildly increased). 48 Hour Holter showed atrial fib with average HR 80, slowest HR 42bpm at 1:12am, longest pause 2.34 seconds at 5am (a few pauses in the ~2sec range), fastest HR 137bpm, occasional PVCs (0.31% of the time). The patient did not report any symptoms while wearing the monitor.  She reports she's felt well since discharge. No recurrent episodes of syncope or near-syncope. She does report occasional dyspnea both at rest and with exertion but this occurs very randomly - it does not happen every day. She remains active in her home and uses the stairs, often without issues. No chest pain reported. She ambulated in clinic today with pulse ox 94-98% and HR up to 125bpm. She denies any unusual bleeding.   Past Medical History  Diagnosis Date  . Anxiety   . Chronic atrial fibrillation     a. Pt previously declined DCCV.  Marland Kitchen Hypertension   . GERD (gastroesophageal reflux disease)   . Barrett's esophagus   . Arthritis   . Coronary artery disease     a. s/p Cypher DES to LAD in 2006;  b. Last LHC 02/2008:  pLAD 30%, mLAD stent patent, pOM 50%, EF 65%;  c. Lexiscan Myoview 7/14:  Intermediate risk, dist ant and inf-lat ischemia, EF 66% - patient declined cath and elected medical therapy.  Marland Kitchen Hx of echocardiogram     Echocardiogram 08/17/12: EF 55-60%, MAC, mild MR, mild LAE, mild RVE, mild to moderate RAE, moderate TR, mildly increased pulmonary artery systolic pressures  . Hyperlipidemia   . Hiatal hernia   . Obesity   .  Mitral regurgitation     a. Mod by echo 10/2014.  . Pulmonary hypertension     a. Mod-severely elevated PA pressures by echo 10/2014.  . Tricuspid regurgitation     a. Mod by echo 10/2014.  Marland Kitchen QT prolongation     a. During 10/2014 admit, QTC 567ms.    Past Surgical History  Procedure Laterality Date  . Ankle surgery    . Coronary angioplasty    . Esophagogastroduodenoscopy    . Abdominal  hysterectomy    . Cesarean section    . Breast cyst excision      Current Outpatient Prescriptions  Medication Sig Dispense Refill  . acetaminophen (TYLENOL) 500 MG tablet Take 500 mg by mouth every 6 (six) hours as needed for mild pain.    Marland Kitchen aspirin 81 MG tablet Take 81 mg by mouth daily.     . calcium carbonate (OS-CAL) 600 MG TABS Take 600 mg by mouth 2 (two) times daily with a meal.     . Cholecalciferol (VITAMIN D-3) 1000 UNITS CAPS Take 1 capsule by mouth daily.    . hydrochlorothiazide (HYDRODIURIL) 25 MG tablet Take 25 mg by mouth daily.     Marland Kitchen LORazepam (ATIVAN) 0.5 MG tablet Take 1 tablet (0.5 mg total) by mouth every 8 (eight) hours as needed for anxiety. 30 tablet 3  . magnesium chloride (SLOW-MAG) 64 MG TBEC SR tablet Take 1 tablet (64 mg total) by mouth 2 (two) times daily. 60 tablet 0  . metoprolol tartrate (LOPRESSOR) 25 MG tablet Take 0.5 tablets (12.5 mg total) by mouth 2 (two) times daily. 30 tablet 6  . nitroGLYCERIN (NITROSTAT) 0.4 MG SL tablet Place 1 tablet (0.4 mg total) under the tongue every 5 (five) minutes as needed for chest pain (3 doses only). 25 tablet 4  . omeprazole (PRILOSEC) 20 MG capsule Take 20 mg by mouth daily.    Marland Kitchen oxybutynin (DITROPAN) 5 MG tablet Take 5 mg by mouth daily.    . potassium chloride SA (K-DUR,KLOR-CON) 20 MEQ tablet Take 1 tablet (20 mEq total) by mouth daily. 30 tablet 0  . Rivaroxaban (XARELTO) 15 MG TABS tablet Take 1 tablet (15 mg total) by mouth daily. 30 tablet 6  . vitamin B-12 (CYANOCOBALAMIN) 1000 MCG tablet Take 1,000 mcg by mouth daily.    . vitamin C (ASCORBIC ACID) 500 MG tablet Take 500 mg by mouth daily.    . vitamin E 400 UNIT capsule Take 400 Units by mouth daily.     No current facility-administered medications for this visit.    Allergies:   Actonel; Ciprofloxacin hcl; Hydralazine hcl; Lisinopril; Oxycodone hcl; Tape; and Valsartan   Social History:  The patient  reports that she has quit smoking. Her smoking use  included Cigarettes. She has never used smokeless tobacco. She reports that she does not drink alcohol or use illicit drugs.   Family History:  The patient's family history includes Diabetes in her mother; Heart attack in her mother; Hypertension in her brother, brother, sister, sister, sister, and sister; Lung cancer in her father; Thyroid disease in her sister and sister. There is no history of Colon cancer, Colon polyps, Esophageal cancer, or Stroke.  ROS:  Please see the history of present illness. All other systems are reviewed and otherwise negative.   PHYSICAL EXAM:  VS:  BP 124/82 mmHg  Pulse 93  Ht 5\' 5"  (1.651 m)  Wt 175 lb (79.379 kg)  BMI 29.12 kg/m2 BMI: Body mass index is  29.12 kg/(m^2). Well nourished, well developed elderly WF in no acute distress. Steady brisk gait. HEENT: normocephalic, atraumatic Neck: no JVD, carotid bruits or masses Cardiac:  normal S1, S2; irregularly irregular; no murmurs, rubs, or gallops Lungs:  clear to auscultation bilaterally, no wheezing, rhonchi or rales Abd: soft, nontender, no hepatomegaly, + BS MS: no deformity or atrophy Ext: no edema, + varicose veins Skin: warm and dry, no rash Neuro:  moves all extremities spontaneously, no focal abnormalities noted, follows commands Psych: euthymic mood, full affect  EKG:  Done today shows atrial fib, 93bpm, possible prior septal infarct, left axis deviation, nonspecific changes but generally unchanged from prior, QTc 421ms  Recent Labs: 10/12/2014: Magnesium 1.5*; TSH 2.393 10/13/2014: BUN 11; Creatinine, Ser 0.91; Hemoglobin 11.7*; Platelets 238; Potassium 3.5; Sodium 134*  No results found for requested labs within last 365 days.   CrCl cannot be calculated (Patient has no serum creatinine result on file.).   Wt Readings from Last 3 Encounters:  11/07/14 175 lb (79.379 kg)  10/26/14 176 lb (79.833 kg)  10/13/14 174 lb (78.926 kg)     Other studies reviewed: Additional studies/records  reviewed today include: summarized above  ASSESSMENT AND PLAN:  1. Recent syncope and near-syncope - possibly due to issues with bradycardia/hypotension. Metoprolol decreased. No recurrent events since that time. Will observe carefully. If she has recurrence I would recommend EP evaluation to consider ILR.  2. Chronic atrial fibrillation with history of bradycardia - 48-hour monitor results as above. Continue anticoagulation. Continue lower dose of metoprolol. HR does get up somewhat with exertion but she seemed to tolerate this well today. It came back to normal with sitting. We are unable to titrate AVN blocking agents due to issues with slower HRs as above. I wonder if her random episodes of dyspnea are related to her HR. We discussed possible referral to EP (as we may at some point need to consider PPM) but she does not wish to proceed at this time. She will let us know if she decides she would like their input. 3. CAD s/p prior PCI - managed medically as above. No recent chest pain reported. 4. Pulmonary hypertension - recent d-dimer normal. She is on anticoagulation. Etiology not totally clear but patient wishes to defer any further invasive workup at this time. Pulse ox is acceptable with ambulation. 5. Hypomagnesemia/hypokalemia - I would like to recheck these given issues with QT during hospitalization. However she thinks her doctor checked both potassium and Mg level at recent f/u along with repeat CBC (Hgb 13.8 on adm, then 11.7 on recheck). Will have nurse contact PCP's office for results. Addendum: f/u labs from PCP reviewed: Mg 1.6 (normal per their ref range), K 3.8, Hgb 13.1. Continue Mg supplementation (currently on usual recommended dose of Slow-Mag) which was recently increased. Will call patient and ask her to increase dietary intake of magnesium.  Disposition: F/u with Dr. [name] in 4 months.  Current medicines are reviewed at length with the patient today.  The patient did not have  any concerns regarding medicines.  Raechel Ache PA-C 11/07/2014 9:16 AM     [facility] [address] [place] [address] [phone] (office)  [phone] (fax)

## 2014-11-07 ENCOUNTER — Telehealth: Payer: Self-pay | Admitting: Physician Assistant

## 2014-11-07 ENCOUNTER — Encounter: Payer: Self-pay | Admitting: Physician Assistant

## 2014-11-07 ENCOUNTER — Ambulatory Visit (INDEPENDENT_AMBULATORY_CARE_PROVIDER_SITE_OTHER): Payer: Medicare Other | Admitting: Physician Assistant

## 2014-11-07 ENCOUNTER — Telehealth: Payer: Self-pay | Admitting: *Deleted

## 2014-11-07 VITALS — BP 124/82 | HR 93 | Ht 65.0 in | Wt 175.0 lb

## 2014-11-07 DIAGNOSIS — I2510 Atherosclerotic heart disease of native coronary artery without angina pectoris: Secondary | ICD-10-CM | POA: Diagnosis not present

## 2014-11-07 DIAGNOSIS — R001 Bradycardia, unspecified: Secondary | ICD-10-CM

## 2014-11-07 DIAGNOSIS — I270 Primary pulmonary hypertension: Secondary | ICD-10-CM

## 2014-11-07 DIAGNOSIS — I272 Pulmonary hypertension, unspecified: Secondary | ICD-10-CM

## 2014-11-07 DIAGNOSIS — R55 Syncope and collapse: Secondary | ICD-10-CM | POA: Diagnosis not present

## 2014-11-07 DIAGNOSIS — I482 Chronic atrial fibrillation, unspecified: Secondary | ICD-10-CM

## 2014-11-07 DIAGNOSIS — Z9861 Coronary angioplasty status: Secondary | ICD-10-CM

## 2014-11-07 DIAGNOSIS — E876 Hypokalemia: Secondary | ICD-10-CM

## 2014-11-07 NOTE — Telephone Encounter (Signed)
Called pt to let her know that we did receive her Lab Results from her PCP office.  Per Melina Copa, PA-C, they all looked ok, except the Magnesium.  It was normal for a PCP's point of view, but cardiologist like to see it at least a 2.  The pt was advised to continue with her Magnesium supplement and try to include in her diet high magnesium foods... Ex: leafy greens, nuts, seeds, fish, beans, whole grains, avocados, yogurt, bananas, dried fruit, dark chocolate.  Pt verbalized appreciation for the call and verbalized understanding.Marland Kitchen

## 2014-11-07 NOTE — Patient Instructions (Addendum)
Medication Instructions:  Your physician recommends that you continue on your current medications as directed. Please refer to the Current Medication list given to you today.   Labwork: None ordered  Testing/Procedures: None ordered  Follow-Up: Your physician recommends that you schedule a follow-up appointment in: [place] [name]   Any Other Special Instructions Will Be Listed Below (If Applicable).

## 2014-11-07 NOTE — Telephone Encounter (Signed)
Please call patient - I reviewed her labs from PCP. Hemoglobin was stable - 13.1. Potassium was better at 3.8. Magnesium was still borderline low at 1.6 (but within normal reference range from primary care labs). Ideally we like to see it closer to 2.0. Please ask her to increase dietary intake of magnesium. High magnesium foods include leafy greens, nuts, seeds, fish, beans, whole grains, avocados, yogurt, bananas, dried fruit, dark chocolate. Continue magnesium supplement. Dayna Dunn PA-C

## 2014-11-20 DIAGNOSIS — I481 Persistent atrial fibrillation: Secondary | ICD-10-CM | POA: Diagnosis not present

## 2014-11-20 DIAGNOSIS — M810 Age-related osteoporosis without current pathological fracture: Secondary | ICD-10-CM | POA: Diagnosis not present

## 2014-11-20 DIAGNOSIS — I10 Essential (primary) hypertension: Secondary | ICD-10-CM | POA: Diagnosis not present

## 2014-11-20 DIAGNOSIS — I2510 Atherosclerotic heart disease of native coronary artery without angina pectoris: Secondary | ICD-10-CM | POA: Diagnosis not present

## 2014-12-05 DIAGNOSIS — H6121 Impacted cerumen, right ear: Secondary | ICD-10-CM | POA: Diagnosis not present

## 2015-01-22 DIAGNOSIS — I2510 Atherosclerotic heart disease of native coronary artery without angina pectoris: Secondary | ICD-10-CM | POA: Diagnosis not present

## 2015-01-22 DIAGNOSIS — I10 Essential (primary) hypertension: Secondary | ICD-10-CM | POA: Diagnosis not present

## 2015-01-22 DIAGNOSIS — E876 Hypokalemia: Secondary | ICD-10-CM | POA: Diagnosis not present

## 2015-01-22 DIAGNOSIS — I481 Persistent atrial fibrillation: Secondary | ICD-10-CM | POA: Diagnosis not present

## 2015-01-29 DIAGNOSIS — I10 Essential (primary) hypertension: Secondary | ICD-10-CM | POA: Diagnosis not present

## 2015-01-29 DIAGNOSIS — I2510 Atherosclerotic heart disease of native coronary artery without angina pectoris: Secondary | ICD-10-CM | POA: Diagnosis not present

## 2015-01-29 DIAGNOSIS — I481 Persistent atrial fibrillation: Secondary | ICD-10-CM | POA: Diagnosis not present

## 2015-01-29 DIAGNOSIS — M160 Bilateral primary osteoarthritis of hip: Secondary | ICD-10-CM | POA: Diagnosis not present

## 2015-01-29 DIAGNOSIS — M25552 Pain in left hip: Secondary | ICD-10-CM | POA: Diagnosis not present

## 2015-02-21 ENCOUNTER — Ambulatory Visit: Payer: Medicare Other | Admitting: Cardiovascular Disease

## 2015-03-21 DIAGNOSIS — N390 Urinary tract infection, site not specified: Secondary | ICD-10-CM

## 2015-03-21 HISTORY — DX: Urinary tract infection, site not specified: N39.0

## 2015-04-10 ENCOUNTER — Inpatient Hospital Stay (HOSPITAL_COMMUNITY)
Admission: EM | Admit: 2015-04-10 | Discharge: 2015-04-13 | DRG: 872 | Disposition: A | Discharge status: Home / Self Care | Payer: Medicare Other | Attending: Internal Medicine | Admitting: Internal Medicine

## 2015-04-10 ENCOUNTER — Emergency Department (HOSPITAL_COMMUNITY): Payer: Medicare Other

## 2015-04-10 ENCOUNTER — Encounter (HOSPITAL_COMMUNITY): Payer: Self-pay

## 2015-04-10 DIAGNOSIS — K219 Gastro-esophageal reflux disease without esophagitis: Secondary | ICD-10-CM | POA: Diagnosis present

## 2015-04-10 DIAGNOSIS — E785 Hyperlipidemia, unspecified: Secondary | ICD-10-CM | POA: Diagnosis present

## 2015-04-10 DIAGNOSIS — R55 Syncope and collapse: Secondary | ICD-10-CM | POA: Diagnosis not present

## 2015-04-10 DIAGNOSIS — N390 Urinary tract infection, site not specified: Secondary | ICD-10-CM

## 2015-04-10 DIAGNOSIS — F419 Anxiety disorder, unspecified: Secondary | ICD-10-CM | POA: Diagnosis present

## 2015-04-10 DIAGNOSIS — Z79899 Other long term (current) drug therapy: Secondary | ICD-10-CM | POA: Diagnosis not present

## 2015-04-10 DIAGNOSIS — A419 Sepsis, unspecified organism: Secondary | ICD-10-CM | POA: Diagnosis not present

## 2015-04-10 DIAGNOSIS — I2510 Atherosclerotic heart disease of native coronary artery without angina pectoris: Secondary | ICD-10-CM | POA: Diagnosis not present

## 2015-04-10 DIAGNOSIS — R404 Transient alteration of awareness: Secondary | ICD-10-CM | POA: Diagnosis not present

## 2015-04-10 DIAGNOSIS — Z87891 Personal history of nicotine dependence: Secondary | ICD-10-CM

## 2015-04-10 DIAGNOSIS — E669 Obesity, unspecified: Secondary | ICD-10-CM | POA: Diagnosis present

## 2015-04-10 DIAGNOSIS — I10 Essential (primary) hypertension: Secondary | ICD-10-CM | POA: Diagnosis not present

## 2015-04-10 DIAGNOSIS — Z8249 Family history of ischemic heart disease and other diseases of the circulatory system: Secondary | ICD-10-CM

## 2015-04-10 DIAGNOSIS — Z801 Family history of malignant neoplasm of trachea, bronchus and lung: Secondary | ICD-10-CM | POA: Diagnosis not present

## 2015-04-10 DIAGNOSIS — B9789 Other viral agents as the cause of diseases classified elsewhere: Secondary | ICD-10-CM | POA: Diagnosis present

## 2015-04-10 DIAGNOSIS — I272 Other secondary pulmonary hypertension: Secondary | ICD-10-CM | POA: Diagnosis present

## 2015-04-10 DIAGNOSIS — Z7982 Long term (current) use of aspirin: Secondary | ICD-10-CM

## 2015-04-10 DIAGNOSIS — R509 Fever, unspecified: Secondary | ICD-10-CM | POA: Diagnosis not present

## 2015-04-10 DIAGNOSIS — Z955 Presence of coronary angioplasty implant and graft: Secondary | ICD-10-CM

## 2015-04-10 DIAGNOSIS — R29818 Other symptoms and signs involving the nervous system: Secondary | ICD-10-CM | POA: Diagnosis not present

## 2015-04-10 DIAGNOSIS — Z6829 Body mass index (BMI) 29.0-29.9, adult: Secondary | ICD-10-CM

## 2015-04-10 DIAGNOSIS — K449 Diaphragmatic hernia without obstruction or gangrene: Secondary | ICD-10-CM | POA: Diagnosis present

## 2015-04-10 DIAGNOSIS — E876 Hypokalemia: Secondary | ICD-10-CM | POA: Diagnosis present

## 2015-04-10 DIAGNOSIS — I4891 Unspecified atrial fibrillation: Secondary | ICD-10-CM | POA: Diagnosis present

## 2015-04-10 DIAGNOSIS — R05 Cough: Secondary | ICD-10-CM | POA: Diagnosis not present

## 2015-04-10 DIAGNOSIS — J069 Acute upper respiratory infection, unspecified: Secondary | ICD-10-CM

## 2015-04-10 DIAGNOSIS — I482 Chronic atrial fibrillation: Secondary | ICD-10-CM

## 2015-04-10 DIAGNOSIS — E860 Dehydration: Secondary | ICD-10-CM | POA: Diagnosis present

## 2015-04-10 DIAGNOSIS — R402431 Glasgow coma scale score 3-8, in the field [EMT or ambulance]: Secondary | ICD-10-CM | POA: Diagnosis not present

## 2015-04-10 DIAGNOSIS — R4182 Altered mental status, unspecified: Secondary | ICD-10-CM | POA: Diagnosis not present

## 2015-04-10 DIAGNOSIS — Z7901 Long term (current) use of anticoagulants: Secondary | ICD-10-CM | POA: Diagnosis not present

## 2015-04-10 HISTORY — DX: Urinary tract infection, site not specified: N39.0

## 2015-04-10 LAB — URINE MICROSCOPIC-ADD ON

## 2015-04-10 LAB — COMPREHENSIVE METABOLIC PANEL
ALT: 15 U/L (ref 14–54)
AST: 25 U/L (ref 15–41)
Albumin: 3.5 g/dL (ref 3.5–5.0)
Alkaline Phosphatase: 47 U/L (ref 38–126)
Anion gap: 16 — ABNORMAL HIGH (ref 5–15)
BILIRUBIN TOTAL: 0.8 mg/dL (ref 0.3–1.2)
BUN: 13 mg/dL (ref 6–20)
CO2: 25 mmol/L (ref 22–32)
Calcium: 9 mg/dL (ref 8.9–10.3)
Chloride: 92 mmol/L — ABNORMAL LOW (ref 101–111)
Creatinine, Ser: 0.9 mg/dL (ref 0.44–1.00)
GFR calc Af Amer: >60 mL/min (ref >60)
GFR, EST NON AFRICAN AMERICAN: 56 mL/min — AB (ref >60)
Glucose, Bld: 140 mg/dL — ABNORMAL HIGH (ref 65–99)
POTASSIUM: 2.6 mmol/L — AB (ref 3.5–5.1)
Sodium: 133 mmol/L — ABNORMAL LOW (ref 135–145)
TOTAL PROTEIN: 6.7 g/dL (ref 6.5–8.1)

## 2015-04-10 LAB — PROTIME-INR
INR: 1.37 (ref 0.00–1.49)
Prothrombin Time: 17 seconds — ABNORMAL HIGH (ref 11.6–15.2)

## 2015-04-10 LAB — URINALYSIS, ROUTINE W REFLEX MICROSCOPIC
BILIRUBIN URINE: NEGATIVE
GLUCOSE, UA: NEGATIVE mg/dL
Ketones, ur: >80 mg/dL — AB
Nitrite: NEGATIVE
Protein, ur: NEGATIVE mg/dL
SPECIFIC GRAVITY, URINE: 1.016 (ref 1.005–1.030)
pH: 7 (ref 5.0–8.0)

## 2015-04-10 LAB — I-STAT CHEM 8, ED
BUN: 14 mg/dL (ref 6–20)
CALCIUM ION: 0.98 mmol/L — AB (ref 1.13–1.30)
CHLORIDE: 89 mmol/L — AB (ref 101–111)
CREATININE: 0.7 mg/dL (ref 0.44–1.00)
GLUCOSE: 139 mg/dL — AB (ref 65–99)
HCT: 43 % (ref 36.0–46.0)
HEMOGLOBIN: 14.6 g/dL (ref 12.0–15.0)
POTASSIUM: 2.4 mmol/L — AB (ref 3.5–5.1)
Sodium: 132 mmol/L — ABNORMAL LOW (ref 135–145)
TCO2: 28 mmol/L (ref 0–100)

## 2015-04-10 LAB — CBC
HEMATOCRIT: 37.6 % (ref 36.0–46.0)
HEMOGLOBIN: 13 g/dL (ref 12.0–15.0)
MCH: 30.2 pg (ref 26.0–34.0)
MCHC: 34.6 g/dL (ref 30.0–36.0)
MCV: 87.4 fL (ref 78.0–100.0)
Platelets: 190 10*3/uL (ref 150–400)
RBC: 4.3 MIL/uL (ref 3.87–5.11)
RDW: 13.1 % (ref 11.5–15.5)
WBC: 11.5 10*3/uL — ABNORMAL HIGH (ref 4.0–10.5)

## 2015-04-10 LAB — CBG MONITORING, ED: GLUCOSE-CAPILLARY: 125 mg/dL — AB (ref 65–99)

## 2015-04-10 LAB — INFLUENZA PANEL BY PCR (TYPE A & B)
H1N1FLUPCR: NOT DETECTED
INFLBPCR: NEGATIVE
Influenza A By PCR: NEGATIVE

## 2015-04-10 LAB — DIFFERENTIAL
BASOS ABS: 0 10*3/uL (ref 0.0–0.1)
Basophils Relative: 0 %
EOS ABS: 0 10*3/uL (ref 0.0–0.7)
EOS PCT: 0 %
LYMPHS ABS: 0.7 10*3/uL (ref 0.7–4.0)
Lymphocytes Relative: 6 %
MONOS PCT: 12 %
Monocytes Absolute: 1.4 10*3/uL — ABNORMAL HIGH (ref 0.1–1.0)
Neutro Abs: 9.4 10*3/uL — ABNORMAL HIGH (ref 1.7–7.7)
Neutrophils Relative %: 82 %

## 2015-04-10 LAB — I-STAT TROPONIN, ED: TROPONIN I, POC: 0.01 ng/mL (ref 0.00–0.08)

## 2015-04-10 LAB — I-STAT CG4 LACTIC ACID, ED: Lactic Acid, Venous: 1.68 mmol/L (ref 0.5–2.0)

## 2015-04-10 LAB — APTT: aPTT: 34 seconds (ref 24–37)

## 2015-04-10 MED ORDER — POTASSIUM CHLORIDE 10 MEQ/100ML IV SOLN
10.0000 meq | INTRAVENOUS | Status: AC
  Filled 2015-04-10: qty 100

## 2015-04-10 MED ORDER — ASPIRIN EC 81 MG PO TBEC
81.0000 mg | DELAYED_RELEASE_TABLET | Freq: Every day | ORAL | Status: DC
  Administered 2015-04-11 – 2015-04-13 (×3): 81 mg via ORAL
  Filled 2015-04-10 (×3): qty 1

## 2015-04-10 MED ORDER — VITAMIN D 1000 UNITS PO TABS
1000.0000 [IU] | ORAL_TABLET | Freq: Every day | ORAL | Status: DC
  Administered 2015-04-11 – 2015-04-13 (×3): 1000 [IU] via ORAL
  Filled 2015-04-10 (×3): qty 1

## 2015-04-10 MED ORDER — HYDROCHLOROTHIAZIDE 25 MG PO TABS: 25.0000 mg | ORAL_TABLET | Freq: Every day | ORAL | Status: DC

## 2015-04-10 MED ORDER — METOPROLOL TARTRATE 12.5 MG HALF TABLET
12.5000 mg | ORAL_TABLET | Freq: Two times a day (BID) | ORAL | Status: DC
  Administered 2015-04-11 – 2015-04-13 (×6): 12.5 mg via ORAL
  Filled 2015-04-10 (×6): qty 1

## 2015-04-10 MED ORDER — DEXTROSE 5 % IV SOLN
1.0000 g | INTRAVENOUS | Status: DC
  Administered 2015-04-11 – 2015-04-12 (×2): 1 g via INTRAVENOUS
  Filled 2015-04-10 (×3): qty 10

## 2015-04-10 MED ORDER — LORAZEPAM 0.5 MG PO TABS: 0.5000 mg | ORAL_TABLET | Freq: Three times a day (TID) | ORAL | Status: DC | PRN

## 2015-04-10 MED ORDER — ACETAMINOPHEN 325 MG PO TABS: 650.0000 mg | ORAL_TABLET | Freq: Four times a day (QID) | ORAL | Status: DC | PRN

## 2015-04-10 MED ORDER — SODIUM CHLORIDE 0.9 % IV SOLN
INTRAVENOUS | Status: DC
  Administered 2015-04-11: 06:00:00 via INTRAVENOUS

## 2015-04-10 MED ORDER — DEXTROSE 5 % IV SOLN
1.0000 g | Freq: Once | INTRAVENOUS | Status: AC
  Administered 2015-04-10: 1 g via INTRAVENOUS
  Filled 2015-04-10: qty 10

## 2015-04-10 MED ORDER — ACETAMINOPHEN 650 MG RE SUPP
650.0000 mg | Freq: Once | RECTAL | Status: AC
  Administered 2015-04-10: 650 mg via RECTAL
  Filled 2015-04-10: qty 1

## 2015-04-10 MED ORDER — ACETAMINOPHEN 500 MG PO TABS: 500.0000 mg | ORAL_TABLET | Freq: Four times a day (QID) | ORAL | Status: DC | PRN

## 2015-04-10 MED ORDER — OXYBUTYNIN CHLORIDE 5 MG PO TABS: 5.0000 mg | ORAL_TABLET | Freq: Every day | ORAL | Status: DC

## 2015-04-10 MED ORDER — POTASSIUM CHLORIDE CRYS ER 20 MEQ PO TBCR
40.0000 meq | EXTENDED_RELEASE_TABLET | Freq: Once | ORAL | Status: AC
  Administered 2015-04-11: 40 meq via ORAL
  Filled 2015-04-10: qty 2

## 2015-04-10 MED ORDER — MAGNESIUM CHLORIDE 64 MG PO TBEC: 1.0000 | DELAYED_RELEASE_TABLET | Freq: Two times a day (BID) | ORAL | Status: DC

## 2015-04-10 MED ORDER — CALCIUM CARBONATE 1250 (500 CA) MG PO TABS
1250.0000 mg | ORAL_TABLET | Freq: Two times a day (BID) | ORAL | Status: DC
  Administered 2015-04-11 – 2015-04-13 (×5): 1250 mg via ORAL
  Filled 2015-04-10 (×7): qty 1

## 2015-04-10 MED ORDER — RIVAROXABAN 15 MG PO TABS
15.0000 mg | ORAL_TABLET | Freq: Every day | ORAL | Status: DC
  Administered 2015-04-11 – 2015-04-12 (×2): 15 mg via ORAL
  Filled 2015-04-10 (×3): qty 1

## 2015-04-10 MED ORDER — PANTOPRAZOLE SODIUM 40 MG PO TBEC
40.0000 mg | DELAYED_RELEASE_TABLET | Freq: Every day | ORAL | Status: DC
  Administered 2015-04-11 – 2015-04-13 (×3): 40 mg via ORAL
  Filled 2015-04-10 (×3): qty 1

## 2015-04-10 NOTE — H&P (Signed)
[facility] History and Physical  [name] [id number] DOB: [birth year] DOA: 04/10/2015  Referring physician: EDP PCP: Merrilee Seashore, MD   Chief Complaint: Syncope   HPI: [name] is a 82 y.o. female with h/o CAD, patient had episode of syncope at home.  She reports that she has been having symptoms of URI recently (as has the entire family), but otherwise had felt okay.  Syncopal episode at home was witnessed and patient was helped to ground by family member (no fall).  No seizure activity, no obvious localizing signs.  Patient was slow to recover and minimally responsive initially to EMS, moaning en route to the ED and still altered initially in the ED.  Work up in the ED has included MRI brain to r/o stroke but no new stroke seen.  Since arrival in the ED she has slowly improved, she is now able to follow commands and answer questions.  Review of Systems: Systems reviewed.  As above, otherwise negative  Past Medical History  Diagnosis Date  . Anxiety   . Chronic atrial fibrillation ([place])     a. Pt previously declined DCCV.  Marland Kitchen Hypertension   . GERD (gastroesophageal reflux disease)   . Barrett's esophagus   . Arthritis   . Coronary artery disease     a. s/p Cypher DES to LAD in 2006;  b. Last LHC 02/2008:  pLAD 30%, mLAD stent patent, pOM 50%, EF 65%;  c. Lexiscan Myoview 7/14:  Intermediate risk, dist ant and inf-lat ischemia, EF 66% - patient declined cath and elected medical therapy.  Marland Kitchen Hx of echocardiogram     Echocardiogram 08/17/12: EF 55-60%, MAC, mild MR, mild LAE, mild RVE, mild to moderate RAE, moderate TR, mildly increased pulmonary artery systolic pressures  . Hyperlipidemia   . Hiatal hernia   . Obesity   . Mitral regurgitation     a. Mod by echo 10/2014.  . Pulmonary hypertension ([place])     a. Mod-severely elevated PA pressures by echo 10/2014.  . Tricuspid regurgitation     a. Mod by echo 10/2014.  Marland Kitchen QT prolongation     a. During 10/2014  admit, QTC 537ms.   Past Surgical History  Procedure Laterality Date  . Ankle surgery    . Coronary angioplasty    . Esophagogastroduodenoscopy    . Abdominal hysterectomy    . Cesarean section    . Breast cyst excision     Social History:  reports that she has quit smoking. Her smoking use included Cigarettes. She has never used smokeless tobacco. She reports that she does not drink alcohol or use illicit drugs.  Allergies  Allergen Reactions  . Actonel [Risedronate Sodium] Other (See Comments)    Caused a lump to come up in throat  . Ciprofloxacin Hcl     hallucinations  . Hydralazine Hcl     Unknown, per pt   . Lisinopril Cough  . Oxycodone Hcl     hallucinations  . Tape Itching  . Valsartan     Unknown, per pt    Family History  Problem Relation Age of Onset  . Diabetes Mother   . Heart attack Mother   . Lung cancer Father   . Thyroid disease Sister   . Thyroid disease Sister   . Hypertension Sister   . Hypertension Sister   . Hypertension Sister   . Hypertension Sister   . Hypertension Brother   . Hypertension Brother   . Colon cancer Neg Hx   .  Colon polyps Neg Hx   . Esophageal cancer Neg Hx   . Stroke Neg Hx      Prior to Admission medications   Medication Sig Start Date End Date Taking? Authorizing Provider  acetaminophen (TYLENOL) 500 MG tablet Take 500 mg by mouth every 6 (six) hours as needed for mild pain.   Yes Historical Provider, MD  aspirin 81 MG tablet Take 81 mg by mouth daily.    Yes Historical Provider, MD  calcium carbonate (OS-CAL) 600 MG TABS Take 600 mg by mouth 2 (two) times daily with a meal.    Yes Historical Provider, MD  Cholecalciferol (VITAMIN D-3) 1000 UNITS CAPS Take 1 capsule by mouth daily.   Yes Historical Provider, MD  hydrochlorothiazide (HYDRODIURIL) 25 MG tablet Take 25 mg by mouth daily.  12/07/12  Yes Historical Provider, MD  LORazepam (ATIVAN) 0.5 MG tablet Take 1 tablet (0.5 mg total) by mouth every 8 (eight) hours  as needed for anxiety. 10/12/14  Yes Josue Hector, MD  magnesium chloride (SLOW-MAG) 64 MG TBEC SR tablet Take 1 tablet (64 mg total) by mouth 2 (two) times daily. 10/13/14  Yes Janece Canterbury, MD  metoprolol tartrate (LOPRESSOR) 25 MG tablet Take 0.5 tablets (12.5 mg total) by mouth 2 (two) times daily. 10/13/14  Yes Isaiah Serge, NP  nitroGLYCERIN (NITROSTAT) 0.4 MG SL tablet Place 1 tablet (0.4 mg total) under the tongue every 5 (five) minutes as needed for chest pain (3 doses only). 10/12/14  Yes Josue Hector, MD  omeprazole (PRILOSEC) 20 MG capsule Take 20 mg by mouth daily.   Yes Historical Provider, MD  oxybutynin (DITROPAN) 5 MG tablet Take 5 mg by mouth daily.   Yes Historical Provider, MD  potassium chloride SA (K-DUR,KLOR-CON) 20 MEQ tablet Take 1 tablet (20 mEq total) by mouth daily. 10/13/14  Yes Janece Canterbury, MD  Pseudoeph-CPM-DM-APAP (TYLENOL COLD PO) Take 1 tablet by mouth daily as needed. For cold symptoms   Yes Historical Provider, MD  Rivaroxaban (XARELTO) 15 MG TABS tablet Take 1 tablet (15 mg total) by mouth daily. 09/28/12  Yes Josue Hector, MD  sodium-potassium bicarbonate (ALKA-SELTZER GOLD) TBEF dissolvable tablet Take 1 tablet by mouth daily as needed. For cold symptoms   Yes Historical Provider, MD  vitamin B-12 (CYANOCOBALAMIN) 1000 MCG tablet Take 1,000 mcg by mouth daily.   Yes Historical Provider, MD  vitamin C (ASCORBIC ACID) 500 MG tablet Take 500 mg by mouth daily.   Yes Historical Provider, MD  vitamin E 400 UNIT capsule Take 400 Units by mouth daily.   Yes Historical Provider, MD   Physical Exam: Filed Vitals:   04/10/15 1711 04/10/15 1721  BP: 130/63   Pulse: 100   Temp:  100.9 F (38.3 C)  Resp: 16     BP 130/63 mmHg  Pulse 100  Temp(Src) 100.9 F (38.3 C) (Rectal)  Resp 16  SpO2 98%  General Appearance:    Alert, oriented, no distress, appears stated age  Head:    Normocephalic, atraumatic  Eyes:    PERRL, EOMI, sclera non-icteric         Nose:   Nares without drainage or epistaxis. Mucosa, turbinates normal  Throat:   Moist mucous membranes. Oropharynx without erythema or exudate.  Neck:   Supple. No carotid bruits.  No thyromegaly.  No lymphadenopathy.   Back:     No CVA tenderness, no spinal tenderness  Lungs:     Clear to auscultation bilaterally, without  wheezes, rhonchi or rales  Chest wall:    No tenderness to palpitation  Heart:    Regular rate and rhythm without murmurs, gallops, rubs  Abdomen:     Soft, non-tender, nondistended, normal bowel sounds, no organomegaly  Genitalia:    deferred  Rectal:    deferred  Extremities:   No clubbing, cyanosis or edema.  Pulses:   2+ and symmetric all extremities  Skin:   Skin color, texture, turgor normal, no rashes or lesions  Lymph nodes:   Cervical, supraclavicular, and axillary nodes normal  Neurologic:   CNII-XII intact. Normal strength, sensation and reflexes      throughout    Labs on Admission:  Basic Metabolic Panel:  Recent Labs Lab 04/10/15 1655 04/10/15 1659  NA 133* 132*  K 2.6* 2.4*  CL 92* 89*  CO2 25  --   GLUCOSE 140* 139*  BUN 13 14  CREATININE 0.90 0.70  CALCIUM 9.0  --    Liver Function Tests:  Recent Labs Lab 04/10/15 1655  AST 25  ALT 15  ALKPHOS 47  BILITOT 0.8  PROT 6.7  ALBUMIN 3.5   No results for input(s): LIPASE, AMYLASE in the last 168 hours. No results for input(s): AMMONIA in the last 168 hours. CBC:  Recent Labs Lab 04/10/15 1655 04/10/15 1659  WBC 11.5*  --   NEUTROABS 9.4*  --   HGB 13.0 14.6  HCT 37.6 43.0  MCV 87.4  --   PLT 190  --    Cardiac Enzymes: No results for input(s): CKTOTAL, CKMB, CKMBINDEX, TROPONINI in the last 168 hours.  BNP (last 3 results) No results for input(s): PROBNP in the last 8760 hours. CBG:  Recent Labs Lab 04/10/15 1730  GLUCAP 125*    Radiological Exams on Admission: Ct Head Wo Contrast  04/10/2015  CLINICAL DATA:  Code stroke, generalized weakness, acute mental  status change, hypertension, coronary artery disease, pulmonary hypertension, former smoker, GERD EXAM: CT HEAD WITHOUT CONTRAST TECHNIQUE: Contiguous axial images were obtained from the base of the skull through the vertex without intravenous contrast. COMPARISON:  09/02/2012 FINDINGS: Generalized atrophy. Normal ventricular morphology. No midline shift or mass effect. Small vessel chronic ischemic changes of deep cerebral white matter. Small old appearing lacunar infarct at periventricular white matter at genu of RIGHT internal capsule, new since prior exam. No intracranial hemorrhage, mass lesion, or acute infarction. Visualized paranasal sinuses and mastoid air cells clear. Bones unremarkable. Atherosclerotic calcifications at the carotid siphons and vertebral arteries. IMPRESSION: Atrophy with small vessel chronic ischemic changes of deep cerebral white matter. Old appearing lacunar infarct in periventricular white matter at genu of RIGHT internal capsule, though new since 2014. No definite acute intracranial abnormalities. Findings called to Dr. Cheral Marker on 03/09/2015 at 1710 hours. Electronically Signed   By: Lavonia Dana M.D.   On: 04/10/2015 17:09   Mr Brain Wo Contrast  04/10/2015  CLINICAL DATA:  82 year old hypertensive female with decreased level of consciousness. History of atrial fibrillation. Subsequent encounter. EXAM: MRI HEAD WITHOUT CONTRAST TECHNIQUE: Multiplanar, multiecho pulse sequences of the brain and surrounding structures were obtained without intravenous contrast. COMPARISON:  04/10/2015 CT and 05/01/2003 MR. FINDINGS: No acute infarct. Remote small infarct posterior right caudate head appears to have been partially hemorrhagic with presence of blood breakdown products within the small region of encephalomalacia. Prominent small vessel disease changes. Prominent global atrophy without hydrocephalus. No intracranial mass lesion noted on this unenhanced exam. Major intracranial vascular  structures are patent. Cervical  medullary junction, pituitary region, pineal region and orbital structures unremarkable. IMPRESSION: No acute infarct. 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 Prominent small vessel disease changes. Prominent global atrophy without hydrocephalus. Electronically Signed   By: Genia Del M.D.   On: 04/10/2015 19:55   Dg Chest Port 1 View  04/10/2015  CLINICAL DATA:  Code stroke.  Fever and cough EXAM: PORTABLE CHEST 1 VIEW COMPARISON:  10/12/2014 FINDINGS: Chronic borderline cardiomegaly. Negative aortic and hilar contours. Coronary stent noted. There is no edema, consolidation, effusion, or pneumothorax. IMPRESSION: Stable.  No evidence of acute cardiopulmonary disease. Electronically Signed   By: Monte Fantasia M.D.   On: 04/10/2015 19:03    EKG: Independently reviewed.  Assessment/Plan Principal Problem:   Sepsis secondary to UTI [facility]) Active Problems:   Coronary atherosclerosis   Atrial fibrillation ([facility])   Hypertension   Syncope   URI (upper respiratory infection)   UTI (lower urinary tract infection)   1. Sepsis secondary to UTI causing syncope - 1. Rocephin 2. Cultures pending 3. IVF 2. URI - Viral 1. Influenza pnl pending 3. HTN - 1. Hold HCTZ, continue metoprolol 4. A.Fib - Continue metoprolol and Xarelto 5. H/o CAD - checking serial troponins with the syncope episode.  Patient has history of similar presentation with MI in the past (MI causing syncope).  No chest pain today however    Code Status: Full  Family Communication: Family at bedside Disposition Plan: Admit to inpatient   Time spent: 102 min  GARDNER, JARED M. [facility] Pager [phone]  If 7AM-7PM, please contact the day team taking care of the patient [email or web address] Password [facility] 04/10/2015, 8:48 PM

## 2015-04-10 NOTE — ED Notes (Signed)
To  ED via EMS.  Onset 3:45p dtr reports that pt had syncope episode, dtr got pt down on floor.  Per EMS pt is flacid bilaterally, pt does not follow commands.

## 2015-04-10 NOTE — Progress Notes (Signed)
Patient ID: [name], female   DOB: [birth year], 82 y.o.   MRN: [id number]    Cardiology Office Note Date:  04/10/2015  Patient ID:  [name], [name] 1926/10/18, MRN [id number] PCP:  Sonya Seashore, MD  Cardiologist:  Dr. [name]   Chief Complaint:  syncope  History of Present Illness: [name] is a 82 y.o. female with history of chronic atrial fibrillation, CAD s/p Cypher DES to LAD in 2006, HTN, syncope, HL, GERD who presents for follow-up of syncope.She has a history of syncope with first episode in 2006 with subsequent stent placement as above. In 08/2012 she had near-syncope felt due to dehydration and orthostasis.  Last LHC 02/2008: pLAD 30%, mLAD stent patent, pOM 50%. She had a follow-up nuclear stress test in 2014 that was abnormal. However she decided to forego cardiac catheterization and opted for medical management. She also has a history of chronic atrial fibrillation and is on Xarelto for anticoagulation. It was recommended that she undergo direct-current cardioversion in the past, however, the patient declined. Her rate has been decently controlled with beta blocker therapy, but her metroprolol was recently reduced during 09/2014 admission to 12.5 mg twice a day due to issues with syncope and suspected bradycardia/hypotension.  She was  admitted 10/10/2014 for syncope evaluation. In the ED she was afebrile without any signs of infection. UA was negative as well as chest x-ray. CT of the head was also negative. ECG was without evidence of acute ischemia and troponins/d-dimer were negative. Telemetry demonstrated atrial fibrillation with occasional pauses and a prolonged QTc. The longest pause was 1.7 sec. Her metoprolol was reduced to 12.5 mg BID. Her K was 3.2-3.5 and Mg 1.5. She was seen in follow-up by Sonya Jester PA-C 10/26/14 at which time she was doing overall well, but still noting occasional episodes of near-syncope. QTc had improved to 420ms (down from  514ms). 2D echo 10/31/14 showed mild LVH, EF 55-60%, no RWMA, mod MR, mild biatrial enlargment, mod TR, mod-severely elevated PA pressures (previously mildly increased). 48 Hour Holter showed atrial fib with average HR 80, slowest HR 42bpm at 1:12am, longest pause 2.34 seconds at 5am (a few pauses in the ~2sec range), fastest HR 137bpm, occasional PVCs (0.31% of the time). The patient did not report any symptoms while wearing the monitor.  04/13/15 Hospitalized for dehydration and UTI  HCTZ stopped also had URI  Feeling stronger today    Past Medical History  Diagnosis Date  . Anxiety   . Chronic atrial fibrillation     a. Pt previously declined DCCV.  Marland Kitchen Hypertension   . GERD (gastroesophageal reflux disease)   . Barrett's esophagus   . Arthritis   . Coronary artery disease     a. s/p Cypher DES to LAD in 2006;  b. Last LHC 02/2008:  pLAD 30%, mLAD stent patent, pOM 50%, EF 65%;  c. Lexiscan Myoview 7/14:  Intermediate risk, dist ant and inf-lat ischemia, EF 66% - patient declined cath and elected medical therapy.  Marland Kitchen Hx of echocardiogram     Echocardiogram 08/17/12: EF 55-60%, MAC, mild MR, mild LAE, mild RVE, mild to moderate RAE, moderate TR, mildly increased pulmonary artery systolic pressures  . Hyperlipidemia   . Hiatal hernia   . Obesity   . Mitral regurgitation     a. Mod by echo 10/2014.  . Pulmonary hypertension     a. Mod-severely elevated PA pressures by echo 10/2014.  . Tricuspid regurgitation     a. Mod  by echo 10/2014.  Marland Kitchen QT prolongation     a. During 10/2014 admit, QTC 540ms.    Past Surgical History  Procedure Laterality Date  . Ankle surgery    . Coronary angioplasty    . Esophagogastroduodenoscopy    . Abdominal hysterectomy    . Cesarean section    . Breast cyst excision      Current Outpatient Prescriptions  Medication Sig Dispense Refill  . acetaminophen (TYLENOL) 500 MG tablet Take 500 mg by mouth every 6 (six) hours as needed for mild pain.    Marland Kitchen aspirin 81 MG  tablet Take 81 mg by mouth daily.     . calcium carbonate (OS-CAL) 600 MG TABS Take 600 mg by mouth 2 (two) times daily with a meal.     . Cholecalciferol (VITAMIN D-3) 1000 UNITS CAPS Take 1 capsule by mouth daily.    . hydrochlorothiazide (HYDRODIURIL) 25 MG tablet Take 25 mg by mouth daily.     Marland Kitchen LORazepam (ATIVAN) 0.5 MG tablet Take 1 tablet (0.5 mg total) by mouth every 8 (eight) hours as needed for anxiety. 30 tablet 3  . magnesium chloride (SLOW-MAG) 64 MG TBEC SR tablet Take 1 tablet (64 mg total) by mouth 2 (two) times daily. 60 tablet 0  . metoprolol tartrate (LOPRESSOR) 25 MG tablet Take 0.5 tablets (12.5 mg total) by mouth 2 (two) times daily. 30 tablet 6  . nitroGLYCERIN (NITROSTAT) 0.4 MG SL tablet Place 1 tablet (0.4 mg total) under the tongue every 5 (five) minutes as needed for chest pain (3 doses only). 25 tablet 4  . omeprazole (PRILOSEC) 20 MG capsule Take 20 mg by mouth daily.    Marland Kitchen oxybutynin (DITROPAN) 5 MG tablet Take 5 mg by mouth daily.    . potassium chloride SA (K-DUR,KLOR-CON) 20 MEQ tablet Take 1 tablet (20 mEq total) by mouth daily. 30 tablet 0  . Rivaroxaban (XARELTO) 15 MG TABS tablet Take 1 tablet (15 mg total) by mouth daily. 30 tablet 6  . vitamin B-12 (CYANOCOBALAMIN) 1000 MCG tablet Take 1,000 mcg by mouth daily.    . vitamin C (ASCORBIC ACID) 500 MG tablet Take 500 mg by mouth daily.    . vitamin E 400 UNIT capsule Take 400 Units by mouth daily.     No current facility-administered medications for this visit.    Allergies:   Actonel; Ciprofloxacin hcl; Hydralazine hcl; Lisinopril; Oxycodone hcl; Tape; and Valsartan   Social History:  The patient  reports that she has quit smoking. Her smoking use included Cigarettes. She has never used smokeless tobacco. She reports that she does not drink alcohol or use illicit drugs.   Family History:  The patient's family history includes Diabetes in her mother; Heart attack in her mother; Hypertension in her brother,  brother, sister, sister, sister, and sister; Lung cancer in her father; Thyroid disease in her sister and sister. There is no history of Colon cancer, Colon polyps, Esophageal cancer, or Stroke.  ROS:  Please see the history of present illness. All other systems are reviewed and otherwise negative.   PHYSICAL EXAM:  VS:  There were no vitals taken for this visit. BMI: There is no weight on file to calculate BMI. Well nourished, well developed elderly WF in no acute distress. Steady brisk gait. HEENT: normocephalic, atraumatic Neck: no JVD, carotid bruits or masses Cardiac:  normal S1, S2; irregularly irregular; no murmurs, rubs, or gallops Lungs:  clear to auscultation bilaterally, no wheezing, rhonchi or rales  Abd: soft, nontender, no hepatomegaly, + BS MS: no deformity or atrophy Ext: no edema, + varicose veins Skin: warm and dry, no rash Neuro:  moves all extremities spontaneously, no focal abnormalities noted, follows commands Psych: euthymic mood, full affect  EKG:  Done today shows atrial fib, 93bpm, possible prior septal infarct, left axis deviation, nonspecific changes but generally unchanged from prior, QTc 412ms  Recent Labs: 10/12/2014: Magnesium 1.5*; TSH 2.393 10/13/2014: BUN 11; Creatinine, Ser 0.91; Hemoglobin 11.7*; Platelets 238; Potassium 3.5; Sodium 134*  No results found for requested labs within last 365 days.   CrCl cannot be calculated (Unknown ideal weight.).   Wt Readings from Last 3 Encounters:  11/07/14 79.379 kg (175 lb)  10/26/14 79.833 kg (176 lb)  10/13/14 78.926 kg (174 lb)     Other studies reviewed: Additional studies/records reviewed today include: summarized above  ASSESSMENT AND PLAN:  1. Recent syncope and near-syncope - possibly due to issues with bradycardia/hypotension. Metoprolol decreased. No recurrent events since that time. Will observe carefully. If she has recurrence I would recommend EP evaluation to consider ILR.  2. Chronic  atrial fibrillation with history of bradycardia - 48-hour monitor results as above. Continue anticoagulation. Continue lower dose of metoprolol. HR does get up somewhat with exertion but she seemed to tolerate this well today. It came back to normal with sitting. We are unable to titrate AVN blocking agents due to issues with slower HRs as above. I wonder if her random episodes of dyspnea are related to her HR. We discussed possible referral to EP (as we may at some point need to consider PPM) but she does not wish to proceed at this time. She will let us know if she decides she would like their input. 3. CAD s/p prior PCI - managed medically as above. No recent chest pain reported. 4. Pulmonary hypertension - recent d-dimer normal. She is on anticoagulation. Etiology not totally clear but patient wishes to defer any further invasive workup at this time. Pulse ox is acceptable with ambulation. 5. Hypomagnesemia/hypokalemia - improved off HCTZ 6. Sepsis resolved BP better f/u primary   Disposition: F/u with me in  6 Months   Current medicines are reviewed at length with the patient today.  The patient did not have any concerns regarding medicines.   Jenkins Rouge

## 2015-04-10 NOTE — ED Notes (Signed)
Lab called K+ 2.6.  Dr. Jeneen Rinks made aware.

## 2015-04-10 NOTE — ED Notes (Signed)
Daughter Cleaster Corin, [phone]

## 2015-04-10 NOTE — Code Documentation (Signed)
Patient was at home today with family.  This afternoon she suddenly had a syncopal episode witnessed by her daughter.  Last known well at 1545.  Code Stroke called at 1628.  Patient arrived via EMS at 1651.  Stat head CT and labs done.  Patient unresponsive to EMS during transport, moves to painful stim.  Patient beginning to awake and follow some commands.  She is still very lethargic and "weak".  NIHSS 8, generalized weakness and decreased LOC. Rectal temp 100.9  EDP at bedside to assess patient.  No focal deficit noted.  Patient with a history of AF on Xeralto.  Patient stated that she took her xeralto last night.

## 2015-04-10 NOTE — ED Provider Notes (Addendum)
CSN: [id number]     Arrival date & time 04/10/15  1651 History   First MD Initiated Contact with Patient 04/10/15 1658     Chief Complaint  Patient presents with  . Code Stroke      HPI  Patient seen and examined at 1700 p.m. She complains decreased level of consciousness. Last time known well was 1545.  Limited little information available upon patient's arrival. However, ultimately, her daughter [name] arrived. Her friend was there with her during the event and can't describe to me the following:  Most of the household including the patient, her daughter, daughter's husband had "cold" with cough and congestion for the last several days. [name] states that the patient didn't feel well this morning and slept in. The daughter's husband is an inpatient at [facility] with a diabetic foot. She left to come visit her husband. Upon getting home she states her mother was up and around but appeared weak. Her mother was walking away and she heard a sound. She'll open over, the patient had crab over-the-counter and had gone down to one knee. Daughter went over to her quickly and helped her to the ground. She did not fall or strike her head. She was making a "grunting sound" in her chin and mouth and arms became stiff. This lasted only a few seconds. Several minutes before she opened her eyes. She was able to nod her head then if asked if she was okay. Upon arrival of paramedics within 5-10 minutes she was able to say the word "kitchen".   Marland Kitchen Upon arrival of paramedics she was minimally responsive to them. She had to be lifted with the assistance of 4 people onto the paramedics cot. Was "moaning" en route but was nonverbal.  No known fall or injury. History of atrial fibrillation, chronically on Xarelto. No reported history of stroke.  Past Medical History  Diagnosis Date  . Anxiety   . Chronic atrial fibrillation ([place])     a. Pt previously declined DCCV.  Marland Kitchen Hypertension   . GERD (gastroesophageal reflux  disease)   . Barrett's esophagus   . Arthritis   . Coronary artery disease     a. s/p Cypher DES to LAD in 2006;  b. Last LHC 02/2008:  pLAD 30%, mLAD stent patent, pOM 50%, EF 65%;  c. Lexiscan Myoview 7/14:  Intermediate risk, dist ant and inf-lat ischemia, EF 66% - patient declined cath and elected medical therapy.  Marland Kitchen Hx of echocardiogram     Echocardiogram 08/17/12: EF 55-60%, MAC, mild MR, mild LAE, mild RVE, mild to moderate RAE, moderate TR, mildly increased pulmonary artery systolic pressures  . Hyperlipidemia   . Hiatal hernia   . Obesity   . Mitral regurgitation     a. Mod by echo 10/2014.  . Pulmonary hypertension ([place])     a. Mod-severely elevated PA pressures by echo 10/2014.  . Tricuspid regurgitation     a. Mod by echo 10/2014.  Marland Kitchen QT prolongation     a. During 10/2014 admit, QTC 521ms.   Past Surgical History  Procedure Laterality Date  . Ankle surgery    . Coronary angioplasty    . Esophagogastroduodenoscopy    . Abdominal hysterectomy    . Cesarean section    . Breast cyst excision     Family History  Problem Relation Age of Onset  . Diabetes Mother   . Heart attack Mother   . Lung cancer Father   . Thyroid disease Sister   .  Thyroid disease Sister   . Hypertension Sister   . Hypertension Sister   . Hypertension Sister   . Hypertension Sister   . Hypertension Brother   . Hypertension Brother   . Colon cancer Neg Hx   . Colon polyps Neg Hx   . Esophageal cancer Neg Hx   . Stroke Neg Hx    Social History  Substance Use Topics  . Smoking status: Former Smoker    Types: Cigarettes  . Smokeless tobacco: Never Used  . Alcohol Use: No   OB History    No data available     Review of Systems  Unable to perform ROS: Mental status change      Allergies  Actonel; Ciprofloxacin hcl; Hydralazine hcl; Lisinopril; Oxycodone hcl; Tape; and Valsartan  Home Medications   Prior to Admission medications   Medication Sig Start Date End Date Taking? Authorizing  Provider  acetaminophen (TYLENOL) 500 MG tablet Take 500 mg by mouth every 6 (six) hours as needed for mild pain.   Yes Historical Provider, MD  aspirin 81 MG tablet Take 81 mg by mouth daily.    Yes Historical Provider, MD  calcium carbonate (OS-CAL) 600 MG TABS Take 600 mg by mouth 2 (two) times daily with a meal.    Yes Historical Provider, MD  Cholecalciferol (VITAMIN D-3) 1000 UNITS CAPS Take 1 capsule by mouth daily.   Yes Historical Provider, MD  hydrochlorothiazide (HYDRODIURIL) 25 MG tablet Take 25 mg by mouth daily.  12/07/12  Yes Historical Provider, MD  LORazepam (ATIVAN) 0.5 MG tablet Take 1 tablet (0.5 mg total) by mouth every 8 (eight) hours as needed for anxiety. 10/12/14  Yes Josue Hector, MD  magnesium chloride (SLOW-MAG) 64 MG TBEC SR tablet Take 1 tablet (64 mg total) by mouth 2 (two) times daily. 10/13/14  Yes Janece Canterbury, MD  metoprolol tartrate (LOPRESSOR) 25 MG tablet Take 0.5 tablets (12.5 mg total) by mouth 2 (two) times daily. 10/13/14  Yes Isaiah Serge, NP  nitroGLYCERIN (NITROSTAT) 0.4 MG SL tablet Place 1 tablet (0.4 mg total) under the tongue every 5 (five) minutes as needed for chest pain (3 doses only). 10/12/14  Yes Josue Hector, MD  omeprazole (PRILOSEC) 20 MG capsule Take 20 mg by mouth daily.   Yes Historical Provider, MD  oxybutynin (DITROPAN) 5 MG tablet Take 5 mg by mouth daily.   Yes Historical Provider, MD  potassium chloride SA (K-DUR,KLOR-CON) 20 MEQ tablet Take 1 tablet (20 mEq total) by mouth daily. 10/13/14  Yes Janece Canterbury, MD  Pseudoeph-CPM-DM-APAP (TYLENOL COLD PO) Take 1 tablet by mouth daily as needed. For cold symptoms   Yes Historical Provider, MD  Rivaroxaban (XARELTO) 15 MG TABS tablet Take 1 tablet (15 mg total) by mouth daily. 09/28/12  Yes Josue Hector, MD  sodium-potassium bicarbonate (ALKA-SELTZER GOLD) TBEF dissolvable tablet Take 1 tablet by mouth daily as needed. For cold symptoms   Yes Historical Provider, MD  vitamin B-12  (CYANOCOBALAMIN) 1000 MCG tablet Take 1,000 mcg by mouth daily.   Yes Historical Provider, MD  vitamin C (ASCORBIC ACID) 500 MG tablet Take 500 mg by mouth daily.   Yes Historical Provider, MD  vitamin E 400 UNIT capsule Take 400 Units by mouth daily.   Yes Historical Provider, MD   BP 126/58 mmHg  Pulse 72  Temp(Src) 100.4 F (38 C) (Rectal)  Resp 19  SpO2 98% Physical Exam  Constitutional: She appears well-developed and well-nourished. No distress.  Eyes closed. Patient will occasionally answer via one word answers.  HENT:  Head: Normocephalic.  Eyes: Conjunctivae are normal. Pupils are equal, round, and reactive to light. No scleral icterus.  Neck: Normal range of motion. Neck supple. No thyromegaly present.  Cardiovascular: Normal rate and regular rhythm.  Exam reveals no gallop and no friction rub.   No murmur heard. Pulmonary/Chest: Effort normal and breath sounds normal. No respiratory distress. She has no wheezes. She has no rales.  Abdominal: Soft. Bowel sounds are normal. She exhibits no distension. There is no tenderness. There is no rebound.  Musculoskeletal: Normal range of motion.  Neurological:  Eyes closed. Will not open spontaneously. Will occasionally answer with one-word.  Hold the left arm for 10 seconds. Cannot hold either leg up against gravity. Unable to obtain visual fields. Extra ocular movements do show eyes to either side to voice when her eyes are held open.  Skin: Skin is warm and dry. No rash noted.    ED Course  Procedures (including critical care time) Labs Review Labs Reviewed  PROTIME-INR - Abnormal; Notable for the following:    Prothrombin Time 17.0 (*)    All other components within normal limits  CBC - Abnormal; Notable for the following:    WBC 11.5 (*)    All other components within normal limits  DIFFERENTIAL - Abnormal; Notable for the following:    Neutro Abs 9.4 (*)    Monocytes Absolute 1.4 (*)    All other components within normal  limits  COMPREHENSIVE METABOLIC PANEL - Abnormal; Notable for the following:    Sodium 133 (*)    Potassium 2.6 (*)    Chloride 92 (*)    Glucose, Bld 140 (*)    GFR calc non Af Amer 56 (*)    Anion gap 16 (*)    All other components within normal limits  URINALYSIS, ROUTINE W REFLEX MICROSCOPIC (NOT AT [facility]) - Abnormal; Notable for the following:    APPearance CLOUDY (*)    Hgb urine dipstick MODERATE (*)    Ketones, ur >80 (*)    Leukocytes, UA LARGE (*)    All other components within normal limits  URINE MICROSCOPIC-ADD ON - Abnormal; Notable for the following:    Squamous Epithelial / LPF 0-5 (*)    Bacteria, UA MANY (*)    All other components within normal limits  CBG MONITORING, ED - Abnormal; Notable for the following:    Glucose-Capillary 125 (*)    All other components within normal limits  I-STAT CHEM 8, ED - Abnormal; Notable for the following:    Sodium 132 (*)    Potassium 2.4 (*)    Chloride 89 (*)    Glucose, Bld 139 (*)    Calcium, Ion 0.98 (*)    All other components within normal limits  CULTURE, BLOOD (ROUTINE X 2)  CULTURE, BLOOD (ROUTINE X 2)  URINE CULTURE  APTT  INFLUENZA PANEL BY PCR (TYPE A & B, H1N1)  TROPONIN I  TROPONIN I  TROPONIN I  CBC  BASIC METABOLIC PANEL  I-STAT TROPOININ, ED  I-STAT CG4 LACTIC ACID, ED    Imaging Review Ct Head Wo Contrast  04/10/2015  CLINICAL DATA:  Code stroke, generalized weakness, acute mental status change, hypertension, coronary artery disease, pulmonary hypertension, former smoker, GERD EXAM: CT HEAD WITHOUT CONTRAST TECHNIQUE: Contiguous axial images were obtained from the base of the skull through the vertex without intravenous contrast. COMPARISON:  09/02/2012 FINDINGS: Generalized atrophy. Normal  ventricular morphology. No midline shift or mass effect. Small vessel chronic ischemic changes of deep cerebral white matter. Small old appearing lacunar infarct at periventricular white matter at genu of RIGHT  internal capsule, new since prior exam. No intracranial hemorrhage, mass lesion, or acute infarction. Visualized paranasal sinuses and mastoid air cells clear. Bones unremarkable. Atherosclerotic calcifications at the carotid siphons and vertebral arteries. IMPRESSION: Atrophy with small vessel chronic ischemic changes of deep cerebral white matter. Old appearing lacunar infarct in periventricular white matter at genu of RIGHT internal capsule, though new since 2014. No definite acute intracranial abnormalities. Findings called to Dr. Cheral Marker on 03/09/2015 at 1710 hours. Electronically Signed   By: Lavonia Dana M.D.   On: 04/10/2015 17:09   Mr Brain Wo Contrast  04/10/2015  CLINICAL DATA:  82 year old hypertensive female with decreased level of consciousness. History of atrial fibrillation. Subsequent encounter. EXAM: MRI HEAD WITHOUT CONTRAST TECHNIQUE: Multiplanar, multiecho pulse sequences of the brain and surrounding structures were obtained without intravenous contrast. COMPARISON:  04/10/2015 CT and 05/01/2003 MR. FINDINGS: No acute infarct. Remote small infarct posterior right caudate head appears to have been partially hemorrhagic with presence of blood breakdown products within the small region of encephalomalacia. Prominent small vessel disease changes. Prominent global atrophy without hydrocephalus. No intracranial mass lesion noted on this unenhanced exam. Major intracranial vascular structures are patent. Cervical medullary junction, pituitary region, pineal region and orbital structures unremarkable. IMPRESSION: No acute infarct. 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 Prominent small vessel disease changes. Prominent global atrophy without hydrocephalus. Electronically Signed   By: Genia Del M.D.   On: 04/10/2015 19:55   Dg Chest Port 1 View  04/10/2015  CLINICAL DATA:  Code stroke.  Fever and cough EXAM: PORTABLE CHEST 1 VIEW COMPARISON:  10/12/2014 FINDINGS:  Chronic borderline cardiomegaly. Negative aortic and hilar contours. Coronary stent noted. There is no edema, consolidation, effusion, or pneumothorax. IMPRESSION: Stable.  No evidence of acute cardiopulmonary disease. Electronically Signed   By: Monte Fantasia M.D.   On: 04/10/2015 19:03   I have personally reviewed and evaluated these images and lab results as part of my medical decision-making.   EKG Interpretation None      MDM   Final diagnoses:  Syncope    Old lacunar infarct on CT. No acute findings. No hemorrhage. Per description this could be syncope with myoclonus, weakness related to hypokalemia, secondary to acute febrile episode and UTI, seizure although uncertain of postictal period, TIA/CVA.  MRI pending. Chest x-ray pending. Blood cultures near cultures obtained. Given IV Rocephin. Tylenol. Will discuss disposition with hospitalist following chest x-ray.    Tanna Furry, MD 04/10/15 [place], MD 04/10/15 [phone]

## 2015-04-11 ENCOUNTER — Encounter (HOSPITAL_COMMUNITY): Payer: Self-pay | Admitting: General Practice

## 2015-04-11 LAB — BASIC METABOLIC PANEL
Anion gap: 14 (ref 5–15)
BUN: 13 mg/dL (ref 6–20)
CHLORIDE: 91 mmol/L — AB (ref 101–111)
CO2: 29 mmol/L (ref 22–32)
Calcium: 8.7 mg/dL — ABNORMAL LOW (ref 8.9–10.3)
Creatinine, Ser: 0.99 mg/dL (ref 0.44–1.00)
GFR calc non Af Amer: 50 mL/min — ABNORMAL LOW (ref >60)
GFR, EST AFRICAN AMERICAN: 58 mL/min — AB (ref >60)
Glucose, Bld: 133 mg/dL — ABNORMAL HIGH (ref 65–99)
POTASSIUM: 2.5 mmol/L — AB (ref 3.5–5.1)
SODIUM: 134 mmol/L — AB (ref 135–145)

## 2015-04-11 LAB — CBC
HCT: 34.7 % — ABNORMAL LOW (ref 36.0–46.0)
Hemoglobin: 12 g/dL (ref 12.0–15.0)
MCH: 30.2 pg (ref 26.0–34.0)
MCHC: 34.6 g/dL (ref 30.0–36.0)
MCV: 87.2 fL (ref 78.0–100.0)
Platelets: 186 10*3/uL (ref 150–400)
RBC: 3.98 MIL/uL (ref 3.87–5.11)
RDW: 13.2 % (ref 11.5–15.5)
WBC: 9.7 10*3/uL (ref 4.0–10.5)

## 2015-04-11 LAB — POTASSIUM
POTASSIUM: 3.8 mmol/L (ref 3.5–5.1)
POTASSIUM: 4.5 mmol/L (ref 3.5–5.1)

## 2015-04-11 LAB — TROPONIN I: Troponin I: <0.03 ng/mL (ref <0.031)

## 2015-04-11 LAB — MAGNESIUM: MAGNESIUM: 1.7 mg/dL (ref 1.7–2.4)

## 2015-04-11 MED ORDER — SODIUM CHLORIDE 0.9 % IV SOLN
INTRAVENOUS | Status: DC
  Administered 2015-04-11: 75 mL/h via INTRAVENOUS
  Administered 2015-04-11: 16:00:00 via INTRAVENOUS

## 2015-04-11 MED ORDER — OXYBUTYNIN CHLORIDE 5 MG PO TABS
5.0000 mg | ORAL_TABLET | Freq: Every day | ORAL | Status: DC
  Administered 2015-04-11 – 2015-04-13 (×3): 5 mg via ORAL
  Filled 2015-04-11 (×3): qty 1

## 2015-04-11 MED ORDER — POTASSIUM CHLORIDE CRYS ER 20 MEQ PO TBCR
40.0000 meq | EXTENDED_RELEASE_TABLET | ORAL | Status: AC
  Administered 2015-04-11 (×2): 40 meq via ORAL
  Filled 2015-04-11 (×2): qty 2

## 2015-04-11 MED ORDER — POTASSIUM CHLORIDE 10 MEQ/100ML IV SOLN
10.0000 meq | INTRAVENOUS | Status: AC
  Administered 2015-04-11 (×3): 10 meq via INTRAVENOUS
  Filled 2015-04-11 (×2): qty 100

## 2015-04-11 MED ORDER — POTASSIUM CHLORIDE 10 MEQ/100ML IV SOLN
10.0000 meq | INTRAVENOUS | Status: AC
  Administered 2015-04-11 (×3): 10 meq via INTRAVENOUS
  Filled 2015-04-11: qty 100

## 2015-04-11 MED ORDER — MAGNESIUM SULFATE IN D5W 10-5 MG/ML-% IV SOLN
1.0000 g | Freq: Once | INTRAVENOUS | Status: AC
  Administered 2015-04-11: 1 g via INTRAVENOUS
  Filled 2015-04-11: qty 100

## 2015-04-11 MED ORDER — MAGNESIUM CHLORIDE 64 MG PO TBEC
1.0000 | DELAYED_RELEASE_TABLET | Freq: Two times a day (BID) | ORAL | Status: DC
  Administered 2015-04-11 – 2015-04-13 (×5): 64 mg via ORAL
  Filled 2015-04-11 (×5): qty 1

## 2015-04-11 NOTE — Progress Notes (Signed)
Utilization review completed.  

## 2015-04-11 NOTE — Progress Notes (Signed)
Patient Demographics:    [name], is a 82 y.o. female, DOB - 08-25-1926, [id number]  Admit date - 04/10/2015   Admitting Physician No admitting provider for patient encounter.  Outpatient Primary MD for the patient is Merrilee Seashore, MD  LOS - 1   Chief Complaint  Patient presents with  . Code Stroke        Subjective:    [name] today has, No headache, No chest pain, No abdominal pain - No Nausea, No new weakness tingling or numbness, No Cough - SOB.     Assessment  & Plan :     1. Syncopal episode due to severe dehydration, hypokalemia and UTI. CT head and MRI brain both nonacute, no headache or focal deficits, no incontinence, tongue bite or seizure-like activity. Will be hydrated with IV fluids, antibiotics for UTI, monitor orthostatics, PT eval and monitor. Note patient says that she was not eating or drinking well for the last 3-4 days. Discontinue HCTZ upon discharge.  2. UTI without any sepsis. Empiric antibiotic follow cultures. Lactate normal, afebrile without toxic appearance. Currently no evidence of URI will monitor.  3. Essential hypertension. Continue beta blocker discontinue HCTZ.  4. Chronic A. fib. [place] vasc 2 score of greater than 4. Currently on beta blocker and xaralto. Only be rate control.  5. CAD. EKG nonacute, chest pain-free, on aspirin, xaralto along with beta blocker continue all for secondary prevention, troponin negative thus far.  5. Severe hypokalemia. Replace and monitor with magnesium levels.    Code Status : Full  Family Communication  : None present  Disposition Plan  : Home in 1-2 days based on PT evaluation  Consults  :  None  Procedures  :   CT scan and MRI brain. Both nonacute. Possible old stroke noted  DVT Prophylaxis   :  Xaralto  Lab Results  Component Value Date   PLT 186 04/11/2015    Inpatient Medications  Scheduled Meds: . aspirin EC  81 mg Oral Daily  . calcium carbonate  1,250 mg Oral BID WC  . cholecalciferol  1,000 Units Oral Daily  . magnesium chloride  1 tablet Oral BID  . metoprolol tartrate  12.5 mg Oral BID  . oxybutynin  5 mg Oral Daily  . pantoprazole  40 mg Oral Daily  . potassium chloride  40 mEq Oral Q4H  . Rivaroxaban  15 mg Oral Daily   Continuous Infusions: . sodium chloride 75 mL/hr (04/11/15 0959)  . cefTRIAXone (ROCEPHIN)  IV    . potassium chloride Stopped (04/11/15 1149)   PRN Meds:.acetaminophen, LORazepam  Antibiotics  :    Anti-infectives    Start     Dose/Rate Route Frequency Ordered Stop   04/11/15 1830  cefTRIAXone (ROCEPHIN) 1 g in dextrose 5 % 50 mL IVPB     1 g 100 mL/hr over 30 Minutes Intravenous Every 24 hours 04/10/15 2012     04/10/15 1830  cefTRIAXone (ROCEPHIN) 1 g in dextrose 5 % 50 mL IVPB     1 g 100 mL/hr over 30 Minutes Intravenous  Once 04/10/15 1829 04/10/15 2220        Objective:   Filed Vitals:   04/11/15 1001 04/11/15 1030 04/11/15 1100 04/11/15 1151  BP: 126/41 126/60 117/63 132/79  Pulse: 84 78 70 81  Temp:      TempSrc:      Resp: 22 15 18 21   SpO2: 97% 96% 94% 96%    Wt Readings from Last 3 Encounters:  11/07/14 79.379 kg (175 lb)  10/26/14 79.833 kg (176 lb)  10/13/14 78.926 kg (174 lb)     Intake/Output Summary (Last 24 hours) at 04/11/15 1153 Last data filed at 04/10/15 2220  Gross per 24 hour  Intake     70 ml  Output    250 ml  Net   -180 ml     Physical Exam  Awake Alert, Oriented X 3, No new F.N deficits, Normal affect [place].AT,PERRAL Supple Neck,No JVD, No cervical lymphadenopathy appriciated.  Symmetrical Chest wall movement, Good air movement bilaterally, CTAB RRR,No Gallops,Rubs or new Murmurs, No Parasternal Heave +ve B.Sounds, Abd Soft, No tenderness, No organomegaly appriciated, No rebound  - guarding or rigidity. No Cyanosis, Clubbing or edema, No new Rash or bruise      Data Review:   Micro Results Recent Results (from the past 240 hour(s))  Urine culture     Status: None (Preliminary result)   Collection Time: 04/10/15  5:51 PM  Result Value Ref Range Status   Specimen Description URINE, CATHETERIZED  Final   Special Requests NONE  Final   Culture TOO YOUNG TO READ  Final   Report Status PENDING  Incomplete    Radiology Reports Ct Head Wo Contrast  04/10/2015  CLINICAL DATA:  Code stroke, generalized weakness, acute mental status change, hypertension, coronary artery disease, pulmonary hypertension, former smoker, GERD EXAM: CT HEAD WITHOUT CONTRAST TECHNIQUE: Contiguous axial images were obtained from the base of the skull through the vertex without intravenous contrast. COMPARISON:  09/02/2012 FINDINGS: Generalized atrophy. Normal ventricular morphology. No midline shift or mass effect. Small vessel chronic ischemic changes of deep cerebral white matter. Small old appearing lacunar infarct at periventricular white matter at genu of RIGHT internal capsule, new since prior exam. No intracranial hemorrhage, mass lesion, or acute infarction. Visualized paranasal sinuses and mastoid air cells clear. Bones unremarkable. Atherosclerotic calcifications at the carotid siphons and vertebral arteries. IMPRESSION: Atrophy with small vessel chronic ischemic changes of deep cerebral white matter. Old appearing lacunar infarct in periventricular white matter at genu of RIGHT internal capsule, though new since 2014. No definite acute intracranial abnormalities. Findings called to Dr. Cheral Marker on 03/09/2015 at 1710 hours. Electronically Signed   By: Lavonia Dana M.D.   On: 04/10/2015 17:09   Mr Brain Wo Contrast  04/10/2015  CLINICAL DATA:  82 year old hypertensive female with decreased level of consciousness. History of atrial fibrillation. Subsequent encounter. EXAM: MRI HEAD WITHOUT CONTRAST  TECHNIQUE: Multiplanar, multiecho pulse sequences of the brain and surrounding structures were obtained without intravenous contrast. COMPARISON:  04/10/2015 CT and 05/01/2003 MR. FINDINGS: No acute infarct. Remote small infarct posterior right caudate head appears to have been partially hemorrhagic with presence of blood breakdown products within the small region of encephalomalacia. Prominent small vessel disease changes. Prominent global atrophy without hydrocephalus. No intracranial mass lesion noted on this unenhanced exam. Major intracranial vascular structures are patent. Cervical medullary junction, pituitary region, pineal region and orbital structures unremarkable. IMPRESSION: No acute infarct. 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 Prominent small vessel disease changes. Prominent global atrophy without hydrocephalus. Electronically Signed   By: Genia Del M.D.   On: 04/10/2015 19:55   Dg Chest [facility]  04/10/2015  CLINICAL DATA:  Code stroke.  Fever and cough EXAM: PORTABLE CHEST 1 VIEW COMPARISON:  10/12/2014 FINDINGS: Chronic borderline cardiomegaly. Negative aortic and hilar contours. Coronary stent noted. There is no edema, consolidation, effusion, or pneumothorax. IMPRESSION: Stable.  No evidence of acute cardiopulmonary disease. Electronically Signed   By: Monte Fantasia M.D.   On: 04/10/2015 19:03     CBC  Recent Labs Lab 04/10/15 1655 04/10/15 1659 04/11/15 0300  WBC 11.5*  --  9.7  HGB 13.0 14.6 12.0  HCT 37.6 43.0 34.7*  PLT 190  --  186  MCV 87.4  --  87.2  MCH 30.2  --  30.2  MCHC 34.6  --  34.6  RDW 13.1  --  13.2  LYMPHSABS 0.7  --   --   MONOABS 1.4*  --   --   EOSABS 0.0  --   --   BASOSABS 0.0  --   --     Chemistries   Recent Labs Lab 04/10/15 1655 04/10/15 1659 04/11/15 0300  NA 133* 132* 134*  K 2.6* 2.4* 2.5*  CL 92* 89* 91*  CO2 25  --  29  GLUCOSE 140* 139* 133*  BUN 13 14 13   CREATININE 0.90 0.70 0.99   CALCIUM 9.0  --  8.7*  AST 25  --   --   ALT 15  --   --   ALKPHOS 47  --   --   BILITOT 0.8  --   --    ------------------------------------------------------------------------------------------------------------------ No results for input(s): CHOL, HDL, LDLCALC, TRIG, CHOLHDL, LDLDIRECT in the last 72 hours.  No results found for: HGBA1C ------------------------------------------------------------------------------------------------------------------ No results for input(s): TSH, T4TOTAL, T3FREE, THYROIDAB in the last 72 hours.  Invalid input(s): FREET3 ------------------------------------------------------------------------------------------------------------------ No results for input(s): VITAMINB12, FOLATE, FERRITIN, TIBC, IRON, RETICCTPCT in the last 72 hours.  Coagulation profile  Recent Labs Lab 04/10/15 1655  INR 1.37    No results for input(s): DDIMER in the last 72 hours.  Cardiac Enzymes  Recent Labs Lab 04/11/15 0111  TROPONINI <0.03   ------------------------------------------------------------------------------------------------------------------ No results found for: BNP  Time Spent in minutes  35   SINGH,PRASHANT K M.D on 04/11/2015 at 11:53 AM  Between 7am to 7pm - Pager - [phone]  After 7pm go to [email or web address] - password [facility]  [facility] -  Office  [phone]

## 2015-04-11 NOTE — ED Notes (Signed)
Per Admitting mD, to give all six bags of potassium, do orthostatic vitals, as well as page MD when results of blood come back

## 2015-04-11 NOTE — Progress Notes (Signed)
[name] [id number] Admission Data: 04/11/2015 3:12 PM Attending Provider: Thurnell Lose, MD  [id number], MD Consults/ Treatment Team:    [name] is a 82 y.o. female patient admitted from ED awake, alert  & orientated  X 3,  Full Code, VSS - Blood pressure 146/85, pulse 80, temperature 99.5 F (37.5 C), temperature source Oral, resp. rate 16, height 5\' 5"  (1.651 m), weight 80.151 kg (176 lb 11.2 oz), SpO2 96 %., , no c/o shortness of breath, no c/o chest pain, no distress noted. Tele # 07 placed.   IV site WDL:  SL at this time.  Allergies:   Allergies  Allergen Reactions  . Actonel [Risedronate Sodium] Other (See Comments)    Caused a lump to come up in throat  . Ciprofloxacin Hcl     hallucinations  . Hydralazine Hcl     Unknown, per pt   . Lisinopril Cough  . Oxycodone Hcl     hallucinations  . Tape Itching  . Valsartan     Unknown, per pt     Past Medical History  Diagnosis Date  . Anxiety   . Chronic atrial fibrillation ([place])     a. Pt previously declined DCCV.  Marland Kitchen Hypertension   . GERD (gastroesophageal reflux disease)   . Barrett's esophagus   . Arthritis   . Coronary artery disease     a. s/p Cypher DES to LAD in 2006;  b. Last LHC 02/2008:  pLAD 30%, mLAD stent patent, pOM 50%, EF 65%;  c. Lexiscan Myoview 7/14:  Intermediate risk, dist ant and inf-lat ischemia, EF 66% - patient declined cath and elected medical therapy.  Marland Kitchen Hx of echocardiogram     Echocardiogram 08/17/12: EF 55-60%, MAC, mild MR, mild LAE, mild RVE, mild to moderate RAE, moderate TR, mildly increased pulmonary artery systolic pressures  . Hyperlipidemia   . Hiatal hernia   . Obesity   . Mitral regurgitation     a. Mod by echo 10/2014.  . Pulmonary hypertension ([place])     a. Mod-severely elevated PA pressures by echo 10/2014.  . Tricuspid regurgitation     a. Mod by echo 10/2014.  Marland Kitchen QT prolongation     a. During 10/2014 admit, QTC 521ms.    Pt orientation to  unit, room and routine. Information packet given to patient/family and safety video watched.  Admission INP armband ID verified with patient/family, and in place. SR up x 2, fall risk assessment complete with Patient and family verbalizing understanding of risks associated with falls. Pt verbalizes an understanding of how to use the call bell and to call for help before getting out of bed.  Skin, clean-dry- intact without evidence of bruising, or skin tears.   No evidence of skin break down noted on exam.     Will cont to monitor and assist as needed.  Sonya Points, RN 04/11/2015 3:12 PM

## 2015-04-11 NOTE — ED Notes (Signed)
This RN was going to scan and hang Potassium. Upon entering pts room there was an empty bag of potassium and no potassium has been scanned on pts MAR and unsure how many runs pt has had. ISTAT chem 8 will be done to recheck potassium and admitting will be paged for further orders.

## 2015-04-11 NOTE — ED Notes (Signed)
A regular diet ordered for lunch.

## 2015-04-12 LAB — BASIC METABOLIC PANEL
Anion gap: 9 (ref 5–15)
BUN: 14 mg/dL (ref 6–20)
CHLORIDE: 100 mmol/L — AB (ref 101–111)
CO2: 23 mmol/L (ref 22–32)
CREATININE: 0.74 mg/dL (ref 0.44–1.00)
Calcium: 8.2 mg/dL — ABNORMAL LOW (ref 8.9–10.3)
GFR calc non Af Amer: >60 mL/min (ref >60)
GLUCOSE: 99 mg/dL (ref 65–99)
POTASSIUM: 3.7 mmol/L (ref 3.5–5.1)
SODIUM: 132 mmol/L — AB (ref 135–145)

## 2015-04-12 LAB — MAGNESIUM: MAGNESIUM: 1.5 mg/dL — AB (ref 1.7–2.4)

## 2015-04-12 MED ORDER — RIVAROXABAN 15 MG PO TABS
15.0000 mg | ORAL_TABLET | Freq: Every day | ORAL | Status: DC
  Administered 2015-04-13: 15 mg via ORAL
  Filled 2015-04-12: qty 1

## 2015-04-12 NOTE — Progress Notes (Signed)
Patient Demographics:    [name], is a 82 y.o. female, DOB - [birth year], [id number]  Admit date - 04/10/2015   Admitting Physician Etta Quill, DO  Outpatient Primary MD for the patient is Merrilee Seashore, MD  LOS - 2   Chief Complaint  Patient presents with  . Code Stroke        Subjective:    [name] today has, No headache, No chest pain, No abdominal pain - No Nausea, No new weakness tingling or numbness, No Cough - SOB.    Assessment  & Plan :     1. Syncopal episode due to severe dehydration, hypokalemia and UTI. CT head and MRI brain both nonacute, no headache or focal deficits, no incontinence, tongue bite or seizure-like activity. Will be hydrated with IV fluids, antibiotics for UTI, monitor orthostatics, PT eval and monitor. Note patient says that she was not eating or drinking well for the last 3-4 days. Discontinue HCTZ upon discharge.  2. UTI without any sepsis. Empiric antibiotic follow cultures. Lactate normal, afebrile without toxic appearance. Currently no evidence of URI will monitor.  3. Essential hypertension. Continue beta blocker discontinue HCTZ.  4. Chronic A. fib. [place] vasc 2 score of greater than 4. Currently on beta blocker and xaralto. Only be rate control.  5. CAD. EKG nonacute, chest pain-free, on aspirin, xaralto along with beta blocker continue all for secondary prevention, troponin negative x 3.  5. Severe hypokalemia. Replaced and monitor with magnesium levels.    Code Status : Full  Family Communication  : None present  Disposition Plan  : Home in am based on PT evaluation  Consults  :  None  Procedures  :   CT scan and MRI brain. Both nonacute. Possible old stroke noted  DVT Prophylaxis  :  Xaralto  Lab Results    Component Value Date   PLT 186 04/11/2015    Inpatient Medications  Scheduled Meds: . aspirin EC  81 mg Oral Daily  . calcium carbonate  1,250 mg Oral BID WC  . cefTRIAXone (ROCEPHIN)  IV  1 g Intravenous Q24H  . cholecalciferol  1,000 Units Oral Daily  . magnesium chloride  1 tablet Oral BID  . metoprolol tartrate  12.5 mg Oral BID  . oxybutynin  5 mg Oral Daily  . pantoprazole  40 mg Oral Daily  . [START ON 04/13/2015] Rivaroxaban  15 mg Oral Q breakfast   Continuous Infusions:   PRN Meds:.acetaminophen, LORazepam  Antibiotics  :    Anti-infectives    Start     Dose/Rate Route Frequency Ordered Stop   04/11/15 1830  cefTRIAXone (ROCEPHIN) 1 g in dextrose 5 % 50 mL IVPB     1 g 100 mL/hr over 30 Minutes Intravenous Every 24 hours 04/10/15 2012     04/10/15 1830  cefTRIAXone (ROCEPHIN) 1 g in dextrose 5 % 50 mL IVPB     1 g 100 mL/hr over 30 Minutes Intravenous  Once 04/10/15 1829 04/10/15 2220        Objective:   Filed Vitals:   04/11/15 1458 04/11/15 2136 04/12/15 0551 04/12/15 0846  BP: 146/85 144/62 125/81 133/59  Pulse: 80 86 86 72  Temp: 99.5 F (37.5 C) 98 F (  36.7 C) 98.7 F (37.1 C)   TempSrc: Oral Oral Oral   Resp: 16 15 15    Height:      Weight:      SpO2: 96% 99% 95%     Wt Readings from Last 3 Encounters:  04/11/15 80.151 kg (176 lb 11.2 oz)  11/07/14 79.379 kg (175 lb)  10/26/14 79.833 kg (176 lb)     Intake/Output Summary (Last 24 hours) at 04/12/15 1015 Last data filed at 04/11/15 1900  Gross per 24 hour  Intake 726.25 ml  Output      0 ml  Net 726.25 ml     Physical Exam  Awake Alert, Oriented X 3, No new F.N deficits, Normal affect [place].AT,PERRAL Supple Neck,No JVD, No cervical lymphadenopathy appriciated.  Symmetrical Chest wall movement, Good air movement bilaterally, CTAB RRR,No Gallops,Rubs or new Murmurs, No Parasternal Heave +ve B.Sounds, Abd Soft, No tenderness, No organomegaly appriciated, No rebound - guarding or  rigidity. No Cyanosis, Clubbing or edema, No new Rash or bruise      Data Review:   Micro Results Recent Results (from the past 240 hour(s))  Urine culture     Status: None (Preliminary result)   Collection Time: 04/10/15  5:51 PM  Result Value Ref Range Status   Specimen Description URINE, CATHETERIZED  Final   Special Requests NONE  Final   Culture TOO YOUNG TO READ  Final   Report Status PENDING  Incomplete  Culture, blood (Routine X 2) w Reflex to ID Panel     Status: None (Preliminary result)   Collection Time: 04/10/15  8:05 PM  Result Value Ref Range Status   Specimen Description BLOOD RIGHT HAND  Final   Special Requests IN PEDIATRIC BOTTLE 3CC  Final   Culture NO GROWTH < 24 HOURS  Final   Report Status PENDING  Incomplete  Culture, blood (Routine X 2) w Reflex to ID Panel     Status: None (Preliminary result)   Collection Time: 04/10/15  8:15 PM  Result Value Ref Range Status   Specimen Description BLOOD RIGHT HAND  Final   Special Requests IN PEDIATRIC BOTTLE 2CC  Final   Culture NO GROWTH < 24 HOURS  Final   Report Status PENDING  Incomplete    Radiology Reports Ct Head Wo Contrast  04/10/2015  CLINICAL DATA:  Code stroke, generalized weakness, acute mental status change, hypertension, coronary artery disease, pulmonary hypertension, former smoker, GERD EXAM: CT HEAD WITHOUT CONTRAST TECHNIQUE: Contiguous axial images were obtained from the base of the skull through the vertex without intravenous contrast. COMPARISON:  09/02/2012 FINDINGS: Generalized atrophy. Normal ventricular morphology. No midline shift or mass effect. Small vessel chronic ischemic changes of deep cerebral white matter. Small old appearing lacunar infarct at periventricular white matter at genu of RIGHT internal capsule, new since prior exam. No intracranial hemorrhage, mass lesion, or acute infarction. Visualized paranasal sinuses and mastoid air cells clear. Bones unremarkable. Atherosclerotic  calcifications at the carotid siphons and vertebral arteries. IMPRESSION: Atrophy with small vessel chronic ischemic changes of deep cerebral white matter. Old appearing lacunar infarct in periventricular white matter at genu of RIGHT internal capsule, though new since 2014. No definite acute intracranial abnormalities. Findings called to Dr. Cheral Marker on 03/09/2015 at 1710 hours. Electronically Signed   By: Lavonia Dana M.D.   On: 04/10/2015 17:09   Mr Brain Wo Contrast  04/10/2015  CLINICAL DATA:  82 year old hypertensive female with decreased level of consciousness. History of atrial fibrillation. Subsequent  encounter. EXAM: MRI HEAD WITHOUT CONTRAST TECHNIQUE: Multiplanar, multiecho pulse sequences of the brain and surrounding structures were obtained without intravenous contrast. COMPARISON:  04/10/2015 CT and 05/01/2003 MR. FINDINGS: No acute infarct. Remote small infarct posterior right caudate head appears to have been partially hemorrhagic with presence of blood breakdown products within the small region of encephalomalacia. Prominent small vessel disease changes. Prominent global atrophy without hydrocephalus. No intracranial mass lesion noted on this unenhanced exam. Major intracranial vascular structures are patent. Cervical medullary junction, pituitary region, pineal region and orbital structures unremarkable. IMPRESSION: No acute infarct. 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 Prominent small vessel disease changes. Prominent global atrophy without hydrocephalus. Electronically Signed   By: Genia Del M.D.   On: 04/10/2015 19:55   Dg Chest Port 1 View  04/10/2015  CLINICAL DATA:  Code stroke.  Fever and cough EXAM: PORTABLE CHEST 1 VIEW COMPARISON:  10/12/2014 FINDINGS: Chronic borderline cardiomegaly. Negative aortic and hilar contours. Coronary stent noted. There is no edema, consolidation, effusion, or pneumothorax. IMPRESSION: Stable.  No evidence of acute  cardiopulmonary disease. Electronically Signed   By: Monte Fantasia M.D.   On: 04/10/2015 19:03     CBC  Recent Labs Lab 04/10/15 1655 04/10/15 1659 04/11/15 0300  WBC 11.5*  --  9.7  HGB 13.0 14.6 12.0  HCT 37.6 43.0 34.7*  PLT 190  --  186  MCV 87.4  --  87.2  MCH 30.2  --  30.2  MCHC 34.6  --  34.6  RDW 13.1  --  13.2  LYMPHSABS 0.7  --   --   MONOABS 1.4*  --   --   EOSABS 0.0  --   --   BASOSABS 0.0  --   --     Chemistries   Recent Labs Lab 04/10/15 1655 04/10/15 1659 04/11/15 0300 04/11/15 1329 04/11/15 1619 04/12/15 0539  NA 133* 132* 134*  --   --  132*  K 2.6* 2.4* 2.5* 3.8 4.5 3.7  CL 92* 89* 91*  --   --  100*  CO2 25  --  29  --   --  23  GLUCOSE 140* 139* 133*  --   --  99  BUN 13 14 13   --   --  14  CREATININE 0.90 0.70 0.99  --   --  0.74  CALCIUM 9.0  --  8.7*  --   --  8.2*  MG  --   --   --  1.7  --  1.5*  AST 25  --   --   --   --   --   ALT 15  --   --   --   --   --   ALKPHOS 47  --   --   --   --   --   BILITOT 0.8  --   --   --   --   --    ------------------------------------------------------------------------------------------------------------------ No results for input(s): CHOL, HDL, LDLCALC, TRIG, CHOLHDL, LDLDIRECT in the last 72 hours.  No results found for: HGBA1C ------------------------------------------------------------------------------------------------------------------ No results for input(s): TSH, T4TOTAL, T3FREE, THYROIDAB in the last 72 hours.  Invalid input(s): FREET3 ------------------------------------------------------------------------------------------------------------------ No results for input(s): VITAMINB12, FOLATE, FERRITIN, TIBC, IRON, RETICCTPCT in the last 72 hours.  Coagulation profile  Recent Labs Lab 04/10/15 1655  INR 1.37    No results for input(s): DDIMER in the last 72 hours.  Cardiac Enzymes  Recent Labs Lab  04/11/15 0111 04/11/15 1024 04/11/15 1619  TROPONINI <0.03 <0.03  <0.03   ------------------------------------------------------------------------------------------------------------------ No results found for: BNP  Time Spent in minutes  35   Mitsugi Schrader K M.D on 04/12/2015 at 10:15 AM  Between 7am to 7pm - Pager - [phone]  After 7pm go to [email or web address] - password [email or web address]  [facility] -  Office  [phone]

## 2015-04-12 NOTE — Evaluation (Signed)
Physical Therapy Evaluation Patient Details Name: [name] MRN: [id number] DOB: 1926/07/21 Today's Date: 04/12/2015   History of Present Illness  Pt adm with syncope due to UTI and dehydration. PMH - afib, HTN, CAD  Clinical Impression  Pt doing well with mobility and no further PT needed.  Ready for dc from PT standpoint.      Follow Up Recommendations No PT follow up    Equipment Recommendations  None recommended by PT    Recommendations for Other Services       Precautions / Restrictions Precautions Precautions: None Restrictions Weight Bearing Restrictions: No      Mobility  Bed Mobility               General bed mobility comments: Pt sitting up in chair.  Transfers Overall transfer level: Modified independent Equipment used: None             General transfer comment: Able to stand from chair and toilet  Ambulation/Gait Ambulation/Gait assistance: Modified independent (Device/Increase time) Ambulation Distance (Feet): 200 Feet Assistive device: None Gait Pattern/deviations: Step-through pattern;Decreased stride length     General Gait Details: slow but steady gait  Stairs            Wheelchair Mobility    Modified Rankin (Stroke Patients Only)       Balance Overall balance assessment: Needs assistance Sitting-balance support: No upper extremity supported;Feet supported Sitting balance-Leahy Scale: Normal     Standing balance support: No upper extremity supported;During functional activity Standing balance-Leahy Scale: Good                               Pertinent Vitals/Pain Pain Assessment: No/denies pain    Home Living Family/patient expects to be discharged to:: Private residence Living Arrangements: Children Available Help at Discharge: Family;Available PRN/intermittently Type of Home: House Home Access: Stairs to enter Entrance Stairs-Rails: Right;Left;Can reach both Entrance Stairs-Number of  Steps: 4-5 Home Layout: Two level;Able to live on main level with bedroom/bathroom Home Equipment: Gilford Rile - 2 wheels;Cane - single point      Prior Function Level of Independence: Independent with assistive device(s)         Comments: Uses cane occasionally if has vertigo     Hand Dominance   Dominant Hand: Right    Extremity/Trunk Assessment   Upper Extremity Assessment: Overall WFL for tasks assessed           Lower Extremity Assessment: Overall WFL for tasks assessed         Communication   Communication: No difficulties  Cognition Arousal/Alertness: Awake/alert Behavior During Therapy: WFL for tasks assessed/performed Overall Cognitive Status: Within Functional Limits for tasks assessed                      General Comments      Exercises        Assessment/Plan    PT Assessment Patent does not need any further PT services  PT Diagnosis Difficulty walking   PT Problem List    PT Treatment Interventions     PT Goals (Current goals can be found in the Care Plan section) Acute Rehab PT Goals PT Goal Formulation: All assessment and education complete, DC therapy    Frequency     Barriers to discharge        Co-evaluation               End of  Session   Activity Tolerance: Patient tolerated treatment well Patient left: in chair;with call bell/phone within reach Nurse Communication: Mobility status         Time: [id number] PT Time Calculation (min) (ACUTE ONLY): 17 min   Charges:   PT Evaluation $PT Eval Low Complexity: 1 Procedure     PT G Codes:        Seleen Walter 2015-04-21, 11:20 AM Suanne Marker PT [phone]

## 2015-04-12 NOTE — Care Management Note (Addendum)
Case Management Note  Patient Details  Name: [name] MRN: [id number] Date of Birth: 25-Oct-1926  Subjective/Objective:                 Patient from home, with adult children. No PT f/u, admitted with sepsis.    Action/Plan:  Anticipate DC today, no CM needs identified.  04-13-15 Patient declines all HH.   Expected Discharge Date:                  Expected Discharge Plan:  Home/Self Care  In-House Referral:     Discharge planning Services  CM Consult  Post Acute Care Choice:  NA Choice offered to:     DME Arranged:    DME Agency:     HH Arranged:    [place] Agency:     Status of Service:  Completed, signed off  Medicare Important Message Given:    Date Medicare IM Given:    Medicare IM give by:    Date Additional Medicare IM Given:    Additional Medicare Important Message give by:     If discussed at [place] of Stay Meetings, dates discussed:    Additional Comments:  Carles Collet, RN 04/12/2015, 11:25 AM

## 2015-04-12 NOTE — Discharge Instructions (Addendum)
Follow with Primary MD Merrilee Seashore, MD in 3 days   Get CBC, CMP,  2 view Chest X ray checked  by Primary MD next visit.    Activity: As tolerated with Full fall precautions use walker/cane & assistance as needed   Disposition Home     Diet:   Heart Healthy   For Heart failure patients - Check your Weight same time everyday, if you gain over 2 pounds, or you develop in leg swelling, experience more shortness of breath or chest pain, call your Primary MD immediately. Follow Cardiac Low Salt Diet and 1.5 lit/day fluid restriction.   On your next visit with your primary care physician please Get Medicines reviewed and adjusted.   Please request your Prim.MD to go over all Hospital Tests and Procedure/Radiological results at the follow up, please get all Hospital records sent to your Prim MD by signing hospital release before you go home.   If you experience worsening of your admission symptoms, develop shortness of breath, life threatening emergency, suicidal or homicidal thoughts you must seek medical attention immediately by calling 911 or calling your MD immediately  if symptoms less severe.  You Must read complete instructions/literature along with all the possible adverse reactions/side effects for all the Medicines you take and that have been prescribed to you. Take any new Medicines after you have completely understood and accpet all the possible adverse reactions/side effects.   Do not drive, operating heavy machinery, perform activities at heights, swimming or participation in water activities or provide baby sitting services if your were admitted for syncope or siezures until you have seen by Primary MD or a Neurologist and advised to do so again.  Do not drive when taking Pain medications.    Do not take more than prescribed Pain, Sleep and Anxiety Medications  Special Instructions: If you have smoked or chewed Tobacco  in the last 2 yrs please stop smoking, stop any  regular Alcohol  and or any Recreational drug use.  Wear Seat belts while driving.   Please note  You were cared for by a hospitalist during your hospital stay. If you have any questions about your discharge medications or the care you received while you were in the hospital after you are discharged, you can call the unit and asked to speak with the hospitalist on call if the hospitalist that took care of you is not available. Once you are discharged, your primary care physician will handle any further medical issues. Please note that NO REFILLS for any discharge medications will be authorized once you are discharged, as it is imperative that you return to your primary care physician (or establish a relationship with a primary care physician if you do not have one) for your aftercare needs so that they can reassess your need for medications and monitor your lab values.     Information on my medicine - Coumadin   (Warfarin)  This medication education was reviewed with me or my healthcare representative as part of my discharge preparation.    Why was Coumadin prescribed for you? Coumadin was prescribed for you because you have a blood clot or a medical condition that can cause an increased risk of forming blood clots. Blood clots can cause serious health problems by blocking the flow of blood to the heart, lung, or brain. Coumadin can prevent harmful blood clots from forming. As a reminder your indication for Coumadin is:   Stroke Prevention Because Of Atrial Fibrillation  What test will  check on my response to Coumadin? While on Coumadin (warfarin) you will need to have an INR test regularly to ensure that your dose is keeping you in the desired range. The INR (international normalized ratio) number is calculated from the result of the laboratory test called prothrombin time (PT).  If an INR APPOINTMENT HAS NOT ALREADY BEEN MADE FOR YOU please schedule an appointment to have this lab work done  by your health care provider within 7 days. Your INR goal is usually a number between:  2 to 3 or your provider may give you a more narrow range like 2-2.5.  Ask your health care provider during an office visit what your goal INR is.  What  do you need to  know  About  COUMADIN? Take Coumadin (warfarin) exactly as prescribed by your healthcare provider about the same time each day.  DO NOT stop taking without talking to the doctor who prescribed the medication.  Stopping without other blood clot prevention medication to take the place of Coumadin may increase your risk of developing a new clot or stroke.  Get refills before you run out.  What do you do if you miss a dose? If you miss a dose, take it as soon as you remember on the same day then continue your regularly scheduled regimen the next day.  Do not take two doses of Coumadin at the same time.  Important Safety Information A possible side effect of Coumadin (Warfarin) is an increased risk of bleeding. You should call your healthcare provider right away if you experience any of the following: ? Bleeding from an injury or your nose that does not stop. ? Unusual colored urine (red or dark brown) or unusual colored stools (red or black). ? Unusual bruising for unknown reasons. ? A serious fall or if you hit your head (even if there is no bleeding).  Some foods or medicines interact with Coumadin (warfarin) and might alter your response to warfarin. To help avoid this: ? Eat a balanced diet, maintaining a consistent amount of Vitamin K. ? Notify your provider about major diet changes you plan to make. ? Avoid alcohol or limit your intake to 1 drink for women and 2 drinks for men per day. (1 drink is 5 oz. wine, 12 oz. beer, or 1.5 oz. liquor.)  Make sure that ANY health care provider who prescribes medication for you knows that you are taking Coumadin (warfarin).  Also make sure the healthcare provider who is monitoring your Coumadin knows  when you have started a new medication including herbals and non-prescription products.  Coumadin (Warfarin)  Major Drug Interactions  Increased Warfarin Effect Decreased Warfarin Effect  Alcohol (large quantities) Antibiotics (esp. Septra/Bactrim, Flagyl, Cipro) Amiodarone (Cordarone) Aspirin (ASA) Cimetidine (Tagamet) Megestrol (Megace) NSAIDs (ibuprofen, naproxen, etc.) Piroxicam (Feldene) Propafenone (Rythmol SR) Propranolol (Inderal) Isoniazid (INH) Posaconazole (Noxafil) Barbiturates (Phenobarbital) Carbamazepine (Tegretol) Chlordiazepoxide (Librium) Cholestyramine (Questran) Griseofulvin Oral Contraceptives Rifampin Sucralfate (Carafate) Vitamin K   Coumadin (Warfarin) Major Herbal Interactions  Increased Warfarin Effect Decreased Warfarin Effect  Garlic Ginseng Ginkgo biloba Coenzyme Q10 Green tea St. Johns wort    Coumadin (Warfarin) FOOD Interactions  Eat a consistent number of servings per week of foods HIGH in Vitamin K (1 serving =  cup)  Collards (cooked, or boiled & drained) Kale (cooked, or boiled & drained) Mustard greens (cooked, or boiled & drained) Parsley *serving size only =  cup Spinach (cooked, or boiled & drained) Swiss chard (cooked, or boiled & drained)  Turnip greens (cooked, or boiled & drained)  Eat a consistent number of servings per week of foods MEDIUM-HIGH in Vitamin K (1 serving = 1 cup)  Asparagus (cooked, or boiled & drained) Broccoli (cooked, boiled & drained, or raw & chopped) Brussel sprouts (cooked, or boiled & drained) *serving size only =  cup Lettuce, raw (green leaf, endive, romaine) Spinach, raw Turnip greens, raw & chopped   These websites have more information on Coumadin (warfarin):  FailFactory.se; VeganReport.com.au;

## 2015-04-13 LAB — URINE CULTURE: Culture: >=100000

## 2015-04-13 MED ORDER — RIVAROXABAN 15 MG PO TABS: 15.0000 mg | ORAL_TABLET | Freq: Every day | ORAL | Status: DC

## 2015-04-13 MED ORDER — DEXTROSE 5 % IV SOLN
1.0000 g | INTRAVENOUS | Status: DC
  Filled 2015-04-13: qty 10

## 2015-04-13 NOTE — Discharge Summary (Signed)
[name], is a 82 y.o. female  DOB 01-25-1927  MRN [id number].  Admission date:  04/10/2015  Admitting Physician  Etta Quill, DO  Discharge Date:  04/13/2015   Primary MD  Merrilee Seashore, MD  Recommendations for primary care physician for things to follow:   Monitor BMP, orthostatic blood pressure closely. Monitor potassium level closely. Discontinued HCTZ. Still on low-dose oral potassium.   Admission Diagnosis  Syncope [R55]   Discharge Diagnosis  Syncope [R55]     Principal Problem:   Sepsis secondary to UTI [facility]) Active Problems:   Coronary atherosclerosis   Atrial fibrillation (HCC)   Hypertension   Syncope   URI (upper respiratory infection)   UTI (lower urinary tract infection)      Past Medical History  Diagnosis Date  . Anxiety   . Chronic atrial fibrillation ([place])     a. Pt previously declined DCCV.  Marland Kitchen Hypertension   . GERD (gastroesophageal reflux disease)   . Barrett's esophagus   . Arthritis   . Coronary artery disease     a. s/p Cypher DES to LAD in 2006;  b. Last LHC 02/2008:  pLAD 30%, mLAD stent patent, pOM 50%, EF 65%;  c. Lexiscan Myoview 7/14:  Intermediate risk, dist ant and inf-lat ischemia, EF 66% - patient declined cath and elected medical therapy.  Marland Kitchen Hx of echocardiogram     Echocardiogram 08/17/12: EF 55-60%, MAC, mild MR, mild LAE, mild RVE, mild to moderate RAE, moderate TR, mildly increased pulmonary artery systolic pressures  . Hyperlipidemia   . Hiatal hernia   . Obesity   . Mitral regurgitation     a. Mod by echo 10/2014.  . Pulmonary hypertension ([place])     a. Mod-severely elevated PA pressures by echo 10/2014.  . Tricuspid regurgitation     a. Mod by echo 10/2014.  Marland Kitchen QT prolongation     a. During 10/2014 admit, QTC 58ms.  Marland Kitchen UTI (lower urinary tract  infection) 03/2015    Past Surgical History  Procedure Laterality Date  . Ankle surgery    . Coronary angioplasty    . Esophagogastroduodenoscopy    . Abdominal hysterectomy    . Cesarean section    . Breast cyst excision         HPI  from the history and physical done on the day of admission:    [name] is a 82 y.o. female with h/o CAD, patient had episode of syncope at home. She reports that she has been having symptoms of URI recently (as has the entire family), but otherwise had felt okay. Syncopal episode at home was witnessed and patient was helped to ground by family member (no fall). No seizure activity, no obvious localizing signs. Patient was slow to recover and minimally responsive initially to EMS, moaning en route to the ED and still altered initially in the ED. Work up in the ED has included MRI brain to r/o stroke but no new stroke seen.  Since arrival in the  ED she has slowly improved, she is now able to follow commands and answer questions.     Hospital Course:      1. Syncopal episode due to severe dehydration, hypokalemia and UTI. CT head and MRI brain both nonacute, no headache or focal deficits, no incontinence, tongue bite or seizure-like activity. Was orthostatic upon admission, much improved with hydration and TED stockings, heated with antibiotics for UTI, now completely symptom-free without any dizziness, enough to PT and cleared for home discharge. We will order home health PT. Note patient says that she was not eating or drinking well for the last 3-4 days. We will discontinue HCTZ upon discharge as patient has had recurrent episodes of dehydration and severe hypokalemia. Kindly monitor BMP and potassium levels closely along with orthostatic blood pressures.  2. UTI without any sepsis. Treated with Rocephin all cultures negative, no evidence of URI.  3. Essential hypertension. Continue beta blocker discontinued HCTZ.  4. Chronic A. fib. [place]  vasc 2 score of greater than 4. Currently on beta blocker and xaralto. Continue upon discharge.  5. CAD. EKG nonacute, chest pain-free, on aspirin, xaralto along with beta blocker continue all for secondary prevention, troponin negative x 3.  5. Severe hypokalemia. Replaced and stable, discharging on low-dose oral potassium which is her home dose, request PCP to monitor levels closely once HCTZ is off.      Discharge Condition: Stable  Follow UP  Follow-up Information    Follow up with [facility], MD. Schedule an appointment as soon as possible for a visit in 3 days.   Specialty:  Internal Medicine   Contact information:   [address] [place] [address] [phone]        Consults obtained - None  Diet and Activity recommendation: See Discharge Instructions below  Discharge Instructions       Discharge Instructions    Diet - low sodium heart healthy    Complete by:  As directed      Discharge instructions    Complete by:  As directed   Follow with Primary MD Merrilee Seashore, MD in 3 days   Get CBC, CMP,   2 view Chest X ray checked  by Primary MD next visit.    Activity: As tolerated with Full fall precautions use walker/cane & assistance as needed   Disposition Home     Diet:   Heart Healthy   For Heart failure patients - Check your Weight same time everyday, if you gain over 2 pounds, or you develop in leg swelling, experience more shortness of breath or chest pain, call your Primary MD immediately. Follow Cardiac Low Salt Diet and 1.5 lit/day fluid restriction.   On your next visit with your primary care physician please Get Medicines reviewed and adjusted.   Please request your Prim.MD to go over all Hospital Tests and Procedure/Radiological results at the follow up, please get all Hospital records sent to your Prim MD by signing hospital release before you go home.   If you experience worsening of your admission symptoms,  develop shortness of breath, life threatening emergency, suicidal or homicidal thoughts you must seek medical attention immediately by calling 911 or calling your MD immediately  if symptoms less severe.  You Must read complete instructions/literature along with all the possible adverse reactions/side effects for all the Medicines you take and that have been prescribed to you. Take any new Medicines after you have completely understood and accpet all the possible adverse reactions/side effects.  Do not drive, operating heavy machinery, perform activities at heights, swimming or participation in water activities or provide baby sitting services if your were admitted for syncope or siezures until you have seen by Primary MD or a Neurologist and advised to do so again.  Do not drive when taking Pain medications.    Do not take more than prescribed Pain, Sleep and Anxiety Medications  Special Instructions: If you have smoked or chewed Tobacco  in the last 2 yrs please stop smoking, stop any regular Alcohol  and or any Recreational drug use.  Wear Seat belts while driving.   Please note  You were cared for by a hospitalist during your hospital stay. If you have any questions about your discharge medications or the care you received while you were in the hospital after you are discharged, you can call the unit and asked to speak with the hospitalist on call if the hospitalist that took care of you is not available. Once you are discharged, your primary care physician will handle any further medical issues. Please note that NO REFILLS for any discharge medications will be authorized once you are discharged, as it is imperative that you return to your primary care physician (or establish a relationship with a primary care physician if you do not have one) for your aftercare needs so that they can reassess your need for medications and monitor your lab values.     Increase activity slowly    Complete by:   As directed              Discharge Medications       Medication List    STOP taking these medications        hydrochlorothiazide 25 MG tablet  Commonly known as:  HYDRODIURIL      TAKE these medications        acetaminophen 500 MG tablet  Commonly known as:  TYLENOL  Take 500 mg by mouth every 6 (six) hours as needed for mild pain.     aspirin 81 MG tablet  Take 81 mg by mouth daily.     calcium carbonate 600 MG Tabs tablet  Commonly known as:  OS-CAL  Take 600 mg by mouth 2 (two) times daily with a meal.     LORazepam 0.5 MG tablet  Commonly known as:  ATIVAN  Take 1 tablet (0.5 mg total) by mouth every 8 (eight) hours as needed for anxiety.     magnesium chloride 64 MG Tbec SR tablet  Commonly known as:  SLOW-MAG  Take 1 tablet (64 mg total) by mouth 2 (two) times daily.     metoprolol tartrate 25 MG tablet  Commonly known as:  LOPRESSOR  Take 0.5 tablets (12.5 mg total) by mouth 2 (two) times daily.     nitroGLYCERIN 0.4 MG SL tablet  Commonly known as:  NITROSTAT  Place 1 tablet (0.4 mg total) under the tongue every 5 (five) minutes as needed for chest pain (3 doses only).     omeprazole 20 MG capsule  Commonly known as:  PRILOSEC  Take 20 mg by mouth daily.     oxybutynin 5 MG tablet  Commonly known as:  DITROPAN  Take 5 mg by mouth daily.     potassium chloride SA 20 MEQ tablet  Commonly known as:  K-DUR,KLOR-CON  Take 1 tablet (20 mEq total) by mouth daily.     sodium-potassium bicarbonate Tbef dissolvable tablet  Commonly known as:  ALKA-SELTZER GOLD  Take 1 tablet by mouth daily as needed. For cold symptoms     TYLENOL COLD PO  Take 1 tablet by mouth daily as needed. For cold symptoms     vitamin B-12 1000 MCG tablet  Commonly known as:  CYANOCOBALAMIN  Take 1,000 mcg by mouth daily.     vitamin C 500 MG tablet  Commonly known as:  ASCORBIC ACID  Take 500 mg by mouth daily.     Vitamin D-3 1000 units Caps  Take 1 capsule by mouth  daily.     vitamin E 400 UNIT capsule  Take 400 Units by mouth daily.     XARELTO 15 MG Tabs tablet  Generic drug:  Rivaroxaban  Take 1 tablet (15 mg total) by mouth daily.        Major procedures and Radiology Reports - PLEASE review detailed and final reports for all details, in brief -     Ct Head Wo Contrast  04/10/2015  CLINICAL DATA:  Code stroke, generalized weakness, acute mental status change, hypertension, coronary artery disease, pulmonary hypertension, former smoker, GERD EXAM: CT HEAD WITHOUT CONTRAST TECHNIQUE: Contiguous axial images were obtained from the base of the skull through the vertex without intravenous contrast. COMPARISON:  09/02/2012 FINDINGS: Generalized atrophy. Normal ventricular morphology. No midline shift or mass effect. Small vessel chronic ischemic changes of deep cerebral white matter. Small old appearing lacunar infarct at periventricular white matter at genu of RIGHT internal capsule, new since prior exam. No intracranial hemorrhage, mass lesion, or acute infarction. Visualized paranasal sinuses and mastoid air cells clear. Bones unremarkable. Atherosclerotic calcifications at the carotid siphons and vertebral arteries. IMPRESSION: Atrophy with small vessel chronic ischemic changes of deep cerebral white matter. Old appearing lacunar infarct in periventricular white matter at genu of RIGHT internal capsule, though new since 2014. No definite acute intracranial abnormalities. Findings called to Dr. Cheral Marker on 03/09/2015 at 1710 hours. Electronically Signed   By: Lavonia Dana M.D.   On: 04/10/2015 17:09   Mr Brain Wo Contrast  04/10/2015  CLINICAL DATA:  82 year old hypertensive female with decreased level of consciousness. History of atrial fibrillation. Subsequent encounter. EXAM: MRI HEAD WITHOUT CONTRAST TECHNIQUE: Multiplanar, multiecho pulse sequences of the brain and surrounding structures were obtained without intravenous contrast. COMPARISON:  04/10/2015  CT and 05/01/2003 MR. FINDINGS: No acute infarct. Remote small infarct posterior right caudate head appears to have been partially hemorrhagic with presence of blood breakdown products within the small region of encephalomalacia. Prominent small vessel disease changes. Prominent global atrophy without hydrocephalus. No intracranial mass lesion noted on this unenhanced exam. Major intracranial vascular structures are patent. Cervical medullary junction, pituitary region, pineal region and orbital structures unremarkable. IMPRESSION: No acute infarct. 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 Prominent small vessel disease changes. Prominent global atrophy without hydrocephalus. Electronically Signed   By: Genia Del M.D.   On: 04/10/2015 19:55   Dg Chest Port 1 View  04/10/2015  CLINICAL DATA:  Code stroke.  Fever and cough EXAM: PORTABLE CHEST 1 VIEW COMPARISON:  10/12/2014 FINDINGS: Chronic borderline cardiomegaly. Negative aortic and hilar contours. Coronary stent noted. There is no edema, consolidation, effusion, or pneumothorax. IMPRESSION: Stable.  No evidence of acute cardiopulmonary disease. Electronically Signed   By: Monte Fantasia M.D.   On: 04/10/2015 19:03    Micro Results      Recent Results (from the past 240 hour(s))  Urine culture     Status: None (Preliminary result)   Collection  Time: 04/10/15  5:51 PM  Result Value Ref Range Status   Specimen Description URINE, CATHETERIZED  Final   Special Requests NONE  Final   Culture >=100,000 COLONIES/mL ESCHERICHIA COLI  Final   Report Status PENDING  Incomplete  Culture, blood (Routine X 2) w Reflex to ID Panel     Status: None (Preliminary result)   Collection Time: 04/10/15  8:05 PM  Result Value Ref Range Status   Specimen Description BLOOD RIGHT HAND  Final   Special Requests IN PEDIATRIC BOTTLE 3CC  Final   Culture NO GROWTH 2 DAYS  Final   Report Status PENDING  Incomplete  Culture, blood  (Routine X 2) w Reflex to ID Panel     Status: None (Preliminary result)   Collection Time: 04/10/15  8:15 PM  Result Value Ref Range Status   Specimen Description BLOOD RIGHT HAND  Final   Special Requests IN PEDIATRIC BOTTLE 2CC  Final   Culture NO GROWTH 2 DAYS  Final   Report Status PENDING  Incomplete    Today   Subjective    [name] today has no headache,no chest abdominal pain,no new weakness tingling or numbness, feels much better wants to go home today.     Objective   Blood pressure 139/79, pulse 84, temperature 98.5 F (36.9 C), temperature source Oral, resp. rate 18, height 5\' 5"  (1.651 m), weight 80.151 kg (176 lb 11.2 oz), SpO2 98 %.   Intake/Output Summary (Last 24 hours) at 04/13/15 1042 Last data filed at 04/13/15 0608  Gross per 24 hour  Intake    220 ml  Output    400 ml  Net   -180 ml    Exam Awake Alert, Oriented x 3, No new F.N deficits, Normal affect [place].AT,PERRAL Supple Neck,No JVD, No cervical lymphadenopathy appriciated.  Symmetrical Chest wall movement, Good air movement bilaterally, CTAB RRR,No Gallops,Rubs or new Murmurs, No Parasternal Heave +ve B.Sounds, Abd Soft, Non tender, No organomegaly appriciated, No rebound -guarding or rigidity. No Cyanosis, Clubbing or edema, No new Rash or bruise   Data Review   CBC w Diff: Lab Results  Component Value Date   WBC 9.7 04/11/2015   HGB 12.0 04/11/2015   HCT 34.7* 04/11/2015   PLT 186 04/11/2015   LYMPHOPCT 6 04/10/2015   MONOPCT 12 04/10/2015   EOSPCT 0 04/10/2015   BASOPCT 0 04/10/2015    CMP: Lab Results  Component Value Date   NA 132* 04/12/2015   K 3.7 04/12/2015   CL 100* 04/12/2015   CO2 23 04/12/2015   BUN 14 04/12/2015   CREATININE 0.74 04/12/2015   PROT 6.7 04/10/2015   ALBUMIN 3.5 04/10/2015   BILITOT 0.8 04/10/2015   ALKPHOS 47 04/10/2015   AST 25 04/10/2015   ALT 15 04/10/2015  .   Total Time in preparing paper work, data evaluation and todays exam - 35  minutes  Thurnell Lose M.D on 04/13/2015 at 10:42 AM  [facility]   Office  [phone]

## 2015-04-13 NOTE — Progress Notes (Signed)
Nsg Discharge Note  Admit Date:  04/10/2015 Discharge date: 04/13/2015   KEYAUNA CARRENO to be D/C'd Home per MD order.  AVS completed.  Copy for chart, and copy for patient signed, and dated. Patient/caregiver able to verbalize understanding.  Discharge Medication:   Medication List    STOP taking these medications        hydrochlorothiazide 25 MG tablet  Commonly known as:  HYDRODIURIL      TAKE these medications        acetaminophen 500 MG tablet  Commonly known as:  TYLENOL  Take 500 mg by mouth every 6 (six) hours as needed for mild pain.     aspirin 81 MG tablet  Take 81 mg by mouth daily.     calcium carbonate 600 MG Tabs tablet  Commonly known as:  OS-CAL  Take 600 mg by mouth 2 (two) times daily with a meal.     LORazepam 0.5 MG tablet  Commonly known as:  ATIVAN  Take 1 tablet (0.5 mg total) by mouth every 8 (eight) hours as needed for anxiety.     magnesium chloride 64 MG Tbec SR tablet  Commonly known as:  SLOW-MAG  Take 1 tablet (64 mg total) by mouth 2 (two) times daily.     metoprolol tartrate 25 MG tablet  Commonly known as:  LOPRESSOR  Take 0.5 tablets (12.5 mg total) by mouth 2 (two) times daily.     nitroGLYCERIN 0.4 MG SL tablet  Commonly known as:  NITROSTAT  Place 1 tablet (0.4 mg total) under the tongue every 5 (five) minutes as needed for chest pain (3 doses only).     omeprazole 20 MG capsule  Commonly known as:  PRILOSEC  Take 20 mg by mouth daily.     oxybutynin 5 MG tablet  Commonly known as:  DITROPAN  Take 5 mg by mouth daily.     potassium chloride SA 20 MEQ tablet  Commonly known as:  K-DUR,KLOR-CON  Take 1 tablet (20 mEq total) by mouth daily.     sodium-potassium bicarbonate Tbef dissolvable tablet  Commonly known as:  ALKA-SELTZER GOLD  Take 1 tablet by mouth daily as needed. For cold symptoms     TYLENOL COLD PO  Take 1 tablet by mouth daily as needed. For cold symptoms     vitamin B-12 1000 MCG tablet  Commonly  known as:  CYANOCOBALAMIN  Take 1,000 mcg by mouth daily.     vitamin C 500 MG tablet  Commonly known as:  ASCORBIC ACID  Take 500 mg by mouth daily.     Vitamin D-3 1000 units Caps  Take 1 capsule by mouth daily.     vitamin E 400 UNIT capsule  Take 400 Units by mouth daily.     XARELTO 15 MG Tabs tablet  Generic drug:  Rivaroxaban  Take 1 tablet (15 mg total) by mouth daily.        Discharge Assessment: Filed Vitals:   04/12/15 2200 04/13/15 0552  BP: 134/57 139/79  Pulse: 82 84  Temp: 97.9 F (36.6 C) 98.5 F (36.9 C)  Resp: 18 18   Skin clean, dry and intact without evidence of skin break down, no evidence of skin tears noted. IV catheter discontinued intact. Site without signs and symptoms of complications - no redness or edema noted at insertion site, patient denies c/o pain - only slight tenderness at site.  Dressing with slight pressure applied.  D/c Instructions-Education: Discharge instructions given  to patient/family with verbalized understanding. D/c education completed with patient/family including follow up instructions, medication list, d/c activities limitations if indicated, with other d/c instructions as indicated by MD - patient able to verbalize understanding, all questions fully answered. Patient instructed to return to ED, call 911, or call MD for any changes in condition.  Patient escorted via [place], and D/C home via private auto.  Dayle Points, RN 04/13/2015 12:36 PM

## 2015-04-13 NOTE — Care Management Important Message (Signed)
Important Message  Patient Details  Name: [name] MRN: [id number] Date of Birth: 01/19/1927   Medicare Important Message Given:  Yes    Nathen May 04/13/2015, 11:43 AM

## 2015-04-15 LAB — CULTURE, BLOOD (ROUTINE X 2)
CULTURE: NO GROWTH
Culture: NO GROWTH

## 2015-04-16 ENCOUNTER — Encounter: Payer: Self-pay | Admitting: Cardiovascular Disease

## 2015-04-16 ENCOUNTER — Ambulatory Visit (INDEPENDENT_AMBULATORY_CARE_PROVIDER_SITE_OTHER): Payer: Medicare Other | Admitting: Cardiovascular Disease

## 2015-04-16 VITALS — BP 140/90 | HR 74 | Ht 62.0 in | Wt 174.1 lb

## 2015-04-16 DIAGNOSIS — I10 Essential (primary) hypertension: Secondary | ICD-10-CM

## 2015-04-16 NOTE — Patient Instructions (Signed)

## 2015-04-18 DIAGNOSIS — E876 Hypokalemia: Secondary | ICD-10-CM | POA: Diagnosis not present

## 2015-04-18 DIAGNOSIS — R55 Syncope and collapse: Secondary | ICD-10-CM | POA: Diagnosis not present

## 2015-04-18 DIAGNOSIS — N390 Urinary tract infection, site not specified: Secondary | ICD-10-CM | POA: Diagnosis not present

## 2015-05-16 DIAGNOSIS — Z85828 Personal history of other malignant neoplasm of skin: Secondary | ICD-10-CM | POA: Diagnosis not present

## 2015-05-16 DIAGNOSIS — L821 Other seborrheic keratosis: Secondary | ICD-10-CM | POA: Diagnosis not present

## 2015-05-16 DIAGNOSIS — L570 Actinic keratosis: Secondary | ICD-10-CM | POA: Diagnosis not present

## 2015-05-16 DIAGNOSIS — D1801 Hemangioma of skin and subcutaneous tissue: Secondary | ICD-10-CM | POA: Diagnosis not present

## 2015-05-31 ENCOUNTER — Other Ambulatory Visit: Payer: Self-pay

## 2015-05-31 DIAGNOSIS — Z1231 Encounter for screening mammogram for malignant neoplasm of breast: Secondary | ICD-10-CM

## 2015-06-04 DIAGNOSIS — E876 Hypokalemia: Secondary | ICD-10-CM | POA: Diagnosis not present

## 2015-06-04 DIAGNOSIS — R55 Syncope and collapse: Secondary | ICD-10-CM | POA: Diagnosis not present

## 2015-06-11 DIAGNOSIS — E876 Hypokalemia: Secondary | ICD-10-CM | POA: Diagnosis not present

## 2015-06-11 DIAGNOSIS — I10 Essential (primary) hypertension: Secondary | ICD-10-CM | POA: Diagnosis not present

## 2015-06-20 ENCOUNTER — Ambulatory Visit
Admission: RE | Admit: 2015-06-20 | Discharge: 2015-06-20 | Disposition: A | Discharge status: Home / Self Care | Payer: Medicare Other | Source: Ambulatory Visit

## 2015-06-20 DIAGNOSIS — Z1231 Encounter for screening mammogram for malignant neoplasm of breast: Secondary | ICD-10-CM

## 2015-07-07 ENCOUNTER — Other Ambulatory Visit: Payer: Self-pay | Admitting: Cardiology

## 2015-07-30 DIAGNOSIS — I2510 Atherosclerotic heart disease of native coronary artery without angina pectoris: Secondary | ICD-10-CM | POA: Diagnosis not present

## 2015-07-30 DIAGNOSIS — I481 Persistent atrial fibrillation: Secondary | ICD-10-CM | POA: Diagnosis not present

## 2015-07-30 DIAGNOSIS — I10 Essential (primary) hypertension: Secondary | ICD-10-CM | POA: Diagnosis not present

## 2015-07-30 DIAGNOSIS — M159 Polyosteoarthritis, unspecified: Secondary | ICD-10-CM | POA: Diagnosis not present

## 2015-08-06 DIAGNOSIS — I2510 Atherosclerotic heart disease of native coronary artery without angina pectoris: Secondary | ICD-10-CM | POA: Diagnosis not present

## 2015-08-06 DIAGNOSIS — I481 Persistent atrial fibrillation: Secondary | ICD-10-CM | POA: Diagnosis not present

## 2015-08-06 DIAGNOSIS — I10 Essential (primary) hypertension: Secondary | ICD-10-CM | POA: Diagnosis not present

## 2015-08-14 DIAGNOSIS — N390 Urinary tract infection, site not specified: Secondary | ICD-10-CM | POA: Diagnosis not present

## 2015-10-23 ENCOUNTER — Other Ambulatory Visit: Payer: Self-pay

## 2015-12-03 DIAGNOSIS — I2510 Atherosclerotic heart disease of native coronary artery without angina pectoris: Secondary | ICD-10-CM | POA: Diagnosis not present

## 2015-12-03 DIAGNOSIS — N390 Urinary tract infection, site not specified: Secondary | ICD-10-CM | POA: Diagnosis not present

## 2015-12-03 DIAGNOSIS — Z0000 Encounter for general adult medical examination without abnormal findings: Secondary | ICD-10-CM | POA: Diagnosis not present

## 2015-12-03 DIAGNOSIS — I481 Persistent atrial fibrillation: Secondary | ICD-10-CM | POA: Diagnosis not present

## 2015-12-03 DIAGNOSIS — I10 Essential (primary) hypertension: Secondary | ICD-10-CM | POA: Diagnosis not present

## 2015-12-03 DIAGNOSIS — Z23 Encounter for immunization: Secondary | ICD-10-CM | POA: Diagnosis not present

## 2015-12-10 DIAGNOSIS — I2510 Atherosclerotic heart disease of native coronary artery without angina pectoris: Secondary | ICD-10-CM | POA: Diagnosis not present

## 2015-12-10 DIAGNOSIS — I481 Persistent atrial fibrillation: Secondary | ICD-10-CM | POA: Diagnosis not present

## 2015-12-10 DIAGNOSIS — E876 Hypokalemia: Secondary | ICD-10-CM | POA: Diagnosis not present

## 2016-01-22 DIAGNOSIS — R350 Frequency of micturition: Secondary | ICD-10-CM | POA: Diagnosis not present

## 2016-01-22 DIAGNOSIS — N390 Urinary tract infection, site not specified: Secondary | ICD-10-CM | POA: Diagnosis not present

## 2016-03-17 ENCOUNTER — Other Ambulatory Visit: Payer: Self-pay | Admitting: Dermatology

## 2016-03-17 DIAGNOSIS — L570 Actinic keratosis: Secondary | ICD-10-CM | POA: Diagnosis not present

## 2016-03-17 DIAGNOSIS — D485 Neoplasm of uncertain behavior of skin: Secondary | ICD-10-CM | POA: Diagnosis not present

## 2016-04-01 ENCOUNTER — Other Ambulatory Visit: Payer: Self-pay | Admitting: Cardiology

## 2016-04-29 ENCOUNTER — Other Ambulatory Visit: Payer: Self-pay | Admitting: Cardiology

## 2016-05-14 DIAGNOSIS — L570 Actinic keratosis: Secondary | ICD-10-CM | POA: Diagnosis not present

## 2016-05-14 DIAGNOSIS — Z85828 Personal history of other malignant neoplasm of skin: Secondary | ICD-10-CM | POA: Diagnosis not present

## 2016-05-14 DIAGNOSIS — D1801 Hemangioma of skin and subcutaneous tissue: Secondary | ICD-10-CM | POA: Diagnosis not present

## 2016-05-14 DIAGNOSIS — L814 Other melanin hyperpigmentation: Secondary | ICD-10-CM | POA: Diagnosis not present

## 2016-05-14 DIAGNOSIS — L821 Other seborrheic keratosis: Secondary | ICD-10-CM | POA: Diagnosis not present

## 2016-05-14 DIAGNOSIS — D485 Neoplasm of uncertain behavior of skin: Secondary | ICD-10-CM | POA: Diagnosis not present

## 2016-05-19 DIAGNOSIS — I481 Persistent atrial fibrillation: Secondary | ICD-10-CM | POA: Diagnosis not present

## 2016-05-19 DIAGNOSIS — I2510 Atherosclerotic heart disease of native coronary artery without angina pectoris: Secondary | ICD-10-CM | POA: Diagnosis not present

## 2016-05-20 ENCOUNTER — Encounter: Payer: Self-pay | Admitting: Cardiovascular Disease

## 2016-05-26 DIAGNOSIS — I2510 Atherosclerotic heart disease of native coronary artery without angina pectoris: Secondary | ICD-10-CM | POA: Diagnosis not present

## 2016-05-26 DIAGNOSIS — E876 Hypokalemia: Secondary | ICD-10-CM | POA: Diagnosis not present

## 2016-05-26 DIAGNOSIS — I10 Essential (primary) hypertension: Secondary | ICD-10-CM | POA: Diagnosis not present

## 2016-05-27 ENCOUNTER — Other Ambulatory Visit: Payer: Self-pay | Admitting: Cardiovascular Disease

## 2016-05-30 NOTE — Progress Notes (Signed)
Patient ID: [name], female   DOB: 10-05-1926, 83 y.o.   MRN: [id number]    Cardiology Office Note Date:  06/03/2016  Patient ID:  Sonya, Pope Jun 22, 1926, MRN [id number] PCP:  Merrilee Seashore, MD  Cardiologist:  Dr. [name]   Chief Complaint:  syncope  History of Present Illness: [name] is a 83 y.o. female with history of chronic atrial fibrillation, CAD s/p Cypher DES to LAD in 2006, HTN, syncope, HL, GERD who presents for follow-up of syncope.She has a history of syncope with first episode in 2006 with subsequent stent placement as above. In 08/2012 she had near-syncope felt due to dehydration and orthostasis.  Last LHC 02/2008: pLAD 30%, mLAD stent patent, pOM 50%. She had a follow-up nuclear stress test in 2014 that was abnormal. However she decided to forego cardiac catheterization and opted for medical management. She also has a history of chronic atrial fibrillation and is on Xarelto for anticoagulation. It was recommended that she undergo direct-current cardioversion in the past, however, the patient declined. Her rate has been decently controlled with beta blocker therapy, but her metroprolol was recently reduced during 09/2014 admission to 12.5 mg twice a day due to issues with syncope and suspected bradycardia/hypotension.  She was  admitted 10/10/2014 for syncope evaluation. In the ED she was afebrile without any signs of infection. UA was negative as well as chest x-ray. CT of the head was also negative. ECG was without evidence of acute ischemia and troponins/d-dimer were negative. Telemetry demonstrated atrial fibrillation with occasional pauses and a prolonged QTc. The longest pause was 1.7 sec. Her metoprolol was reduced to 12.5 mg BID. Her K was 3.2-3.5 and Mg 1.5. She was seen in follow-up by Lyda Jester PA-C 10/26/14 at which time she was doing overall well, but still noting occasional episodes of near-syncope. QTc had improved to 435m (down from  5562m. 2D echo 10/31/14 showed mild LVH, EF 55-60%, no RWMA, mod MR, mild biatrial enlargment, mod TR, mod-severely elevated PA pressures (previously mildly increased). 48 Hour Holter showed atrial fib with average HR 80, slowest HR 42bpm at 1:12am, longest pause 2.34 seconds at 5am (a few pauses in the ~2sec range), fastest HR 137bpm, occasional PVCs (0.31% of the time). The patient did not report any symptoms while wearing the monitor.  04/13/15 Hospitalized for dehydration and UTI  HCTZ stopped also had URI     Past Medical History:  Diagnosis Date  . Anxiety   . Arthritis   . Barrett's esophagus   . Chronic atrial fibrillation ([place]   a. Pt previously declined DCCV.  . Marland Kitchenoronary artery disease    a. s/p Cypher DES to LAD in 2006;  b. Last LHC 02/2008:  pLAD 30%, mLAD stent patent, pOM 50%, EF 65%;  c. Lexiscan Myoview 7/14:  Intermediate risk, dist ant and inf-lat ischemia, EF 66% - patient declined cath and elected medical therapy.  . Marland KitchenERD (gastroesophageal reflux disease)   . Hiatal hernia   . Hx of echocardiogram    Echocardiogram 08/17/12: EF 55-60%, MAC, mild MR, mild LAE, mild RVE, mild to moderate RAE, moderate TR, mildly increased pulmonary artery systolic pressures  . Hyperlipidemia   . Hypertension   . Mitral regurgitation    a. Mod by echo 10/2014.  . Obesity   . [place]monary hypertension ([place]   a. Mod-severely elevated PA pressures by echo 10/2014.  . Marland KitchenT prolongation    a. During 10/2014 admit, QTC 55062m . Tricuspid  regurgitation    a. Mod by echo 10/2014.  Marland Kitchen UTI (lower urinary tract infection) 03/2015    Past Surgical History:  Procedure Laterality Date  . ABDOMINAL HYSTERECTOMY    . ANKLE SURGERY    . BREAST CYST EXCISION    . CESAREAN SECTION    . CORONARY ANGIOPLASTY    . ESOPHAGOGASTRODUODENOSCOPY      Current Outpatient Prescriptions  Medication Sig Dispense Refill  . acetaminophen (TYLENOL) 500 MG tablet Take 500 mg by mouth every 6 (six) hours as needed for  mild pain.    Marland Kitchen aspirin 81 MG tablet Take 81 mg by mouth daily.     . calcium carbonate (OS-CAL) 600 MG TABS Take 600 mg by mouth 2 (two) times daily with a meal.     . Cholecalciferol (VITAMIN D-3) 1000 UNITS CAPS Take 1 capsule by mouth daily.    Marland Kitchen LORazepam (ATIVAN) 0.5 MG tablet Take 1 tablet (0.5 mg total) by mouth every 8 (eight) hours as needed for anxiety. 30 tablet 3  . magnesium chloride (SLOW-MAG) 64 MG TBEC SR tablet Take 1 tablet (64 mg total) by mouth 2 (two) times daily. 60 tablet 0  . metoprolol tartrate (LOPRESSOR) 25 MG tablet Take 0.5 tablets (12.5 mg total) by mouth 2 (two) times daily. 30 tablet 0  . nitroGLYCERIN (NITROSTAT) 0.4 MG SL tablet Place 1 tablet (0.4 mg total) under the tongue every 5 (five) minutes as needed for chest pain (3 doses only). 25 tablet 4  . omeprazole (PRILOSEC) 20 MG capsule Take 20 mg by mouth daily.    Marland Kitchen oxybutynin (DITROPAN) 5 MG tablet Take 5 mg by mouth daily.    . potassium chloride SA (K-DUR,KLOR-CON) 20 MEQ tablet Take 1 tablet (20 mEq total) by mouth daily. 30 tablet 0  . Pseudoeph-CPM-DM-APAP (TYLENOL COLD PO) Take 1 tablet by mouth daily as needed. For cold symptoms    . Rivaroxaban (XARELTO) 15 MG TABS tablet Take 1 tablet (15 mg total) by mouth daily. 30 tablet 6  . sodium-potassium bicarbonate (ALKA-SELTZER GOLD) TBEF dissolvable tablet Take 1 tablet by mouth daily as needed. For cold symptoms    . vitamin B-12 (CYANOCOBALAMIN) 1000 MCG tablet Take 1,000 mcg by mouth daily.    . vitamin C (ASCORBIC ACID) 500 MG tablet Take 500 mg by mouth daily.    . vitamin E 400 UNIT capsule Take 400 Units by mouth daily.     No current facility-administered medications for this visit.     Allergies:   Actonel [risedronate sodium]; Ciprofloxacin hcl; Hydralazine hcl; Lisinopril; Oxycodone hcl; Tape; and Valsartan   Social History:  The patient  reports that she has quit smoking. Her smoking use included Cigarettes. She has never used smokeless  tobacco. She reports that she does not drink alcohol or use drugs.   Family History:  The patient's family history includes Diabetes in her mother; Heart attack in her mother; Hypertension in her brother, brother, sister, sister, sister, and sister; Lung cancer in her father; Thyroid disease in her sister and sister.  ROS:  Please see the history of present illness. All other systems are reviewed and otherwise negative.   PHYSICAL EXAM:  BP (!) 208/120 (BP Location: Left Arm, Patient Position: Sitting, Cuff Size: Normal)   Pulse 100   Ht _0  (1.702 m)   Wt 169 lb 12.8 oz (77 kg)   SpO2 98%   BMI 26.59 kg/m  Affect appropriate Healthy:  appears stated age 89: normal  Neck supple with no adenopathy JVP normal no bruits no thyromegaly Lungs clear with no wheezing and good diaphragmatic motion Heart:  S1/S2 no murmur, no rub, gallop or click PMI normal Abdomen: benighn, BS positve, no tenderness, no AAA no bruit.  No HSM or HJR Distal pulses intact with no bruits No edema Neuro non-focal Skin warm and dry No muscular weakness   EKG:  Atrial fib, 93bpm, possible prior septal infarct, left axis deviation, nonspecific changes but generally unchanged from prior, QTc 432m  06/03/16 Afib rate 87 LVH nonspecific ST/T wave changes LAD   Recent Labs: No results found for requested labs within last 8760 hours.  No results found for requested labs within last 8760 hours.   CrCl cannot be calculated (Patient's most recent lab result is older than the maximum 21 days allowed.).   Wt Readings from Last 3 Encounters:  06/03/16 169 lb 12.8 oz (77 kg)  04/16/15 174 lb 1.9 oz (79 kg)  04/11/15 176 lb 11.2 oz (80.2 kg)     Other studies reviewed: Additional studies/records reviewed today include: summarized above  ASSESSMENT AND PLAN:  1. Syncope - possibly due to issues with bradycardia. Metoprolol decreased. No recurrent events since that time. Will observe carefully. If she has  recurrence I would recommend EP evaluation to consider ILR.  2. Chronic atrial fibrillation with history of bradycardia - 48-hour monitor results as above. Continue anticoagulation. Continue lower dose of metoprolol.  We are unable to titrate AVN blocking agents due to issues with slower HRs as above.CAD s/p prior PCI - managed medically as above. No recent chest pain reported. 3. Pulmonary hypertension - recent d-dimer normal. She is on anticoagulation. Etiology not totally clear but patient wishes to defer any further invasive workup at this time. Pulse ox is acceptable with ambulation. 4. Hypomagnesemia/hypokalemia - improved off HCTZ 5. HTN:  Has had a lot of Raman Noodles cut back salt start norvasc 10 mg Has intolerances / allergy to ACE/ARB/Hydralazine diuretic stopped last year see above    Disposition: F/u with Nurse 2 weeks BP check and me 6 weeks BMET/Mg and renal duplex ordered    Current medicines are reviewed at length with the patient today.  The patient did not have any concerns regarding medicines.   PJenkins Rouge

## 2016-06-03 ENCOUNTER — Encounter: Payer: Self-pay | Admitting: Cardiovascular Disease

## 2016-06-03 ENCOUNTER — Ambulatory Visit (INDEPENDENT_AMBULATORY_CARE_PROVIDER_SITE_OTHER): Payer: Medicare Other | Admitting: Cardiovascular Disease

## 2016-06-03 VITALS — BP 208/120 | HR 100 | Ht 67.0 in | Wt 169.8 lb

## 2016-06-03 DIAGNOSIS — I701 Atherosclerosis of renal artery: Secondary | ICD-10-CM | POA: Diagnosis not present

## 2016-06-03 DIAGNOSIS — I10 Essential (primary) hypertension: Secondary | ICD-10-CM | POA: Diagnosis not present

## 2016-06-03 DIAGNOSIS — I2510 Atherosclerotic heart disease of native coronary artery without angina pectoris: Secondary | ICD-10-CM | POA: Diagnosis not present

## 2016-06-03 DIAGNOSIS — Z9861 Coronary angioplasty status: Secondary | ICD-10-CM

## 2016-06-03 DIAGNOSIS — I482 Chronic atrial fibrillation, unspecified: Secondary | ICD-10-CM

## 2016-06-03 MED ORDER — AMLODIPINE BESYLATE 10 MG PO TABS: 10.0000 mg | ORAL_TABLET | Freq: Every day | ORAL | 3 refills | Status: DC

## 2016-06-03 MED ORDER — METOPROLOL TARTRATE 25 MG PO TABS: 12.5000 mg | ORAL_TABLET | Freq: Two times a day (BID) | ORAL | 3 refills | Status: DC

## 2016-06-03 NOTE — Patient Instructions (Addendum)
Medication Instructions:  Your physician has recommended you make the following change in your medication:  1-START Norvasc 10 mg by mouth daily  Labwork: Your physician recommends that you have lab work today- BMET and Mg  Testing/Procedures: Your physician has requested that you have a renal artery duplex. During this test, an ultrasound is used to evaluate blood flow to the kidneys. Allow one hour for this exam. Do not eat after midnight the day before and avoid carbonated beverages. Take your medications as you usually do.  Follow-Up: Your physician recommends that you schedule a follow-up appointment in 2 weeks with nurse room visit or blood pressure clinic for BP check.  Your physician wants you to follow-up in: next available with Dr. [name].   If you need a refill on your cardiac medications before your next appointment, please call your pharmacy.

## 2016-06-04 LAB — MAGNESIUM: Magnesium: 1.8 mg/dL (ref 1.6–2.3)

## 2016-06-04 LAB — BASIC METABOLIC PANEL
BUN / CREAT RATIO: 14 (ref 12–28)
BUN: 11 mg/dL (ref 8–27)
CHLORIDE: 95 mmol/L — AB (ref 96–106)
CO2: 24 mmol/L (ref 18–29)
CREATININE: 0.76 mg/dL (ref 0.57–1.00)
Calcium: 10.1 mg/dL (ref 8.7–10.3)
GFR calc non Af Amer: 71 mL/min/{1.73_m2} (ref >59)
GFR, EST AFRICAN AMERICAN: 82 mL/min/{1.73_m2} (ref >59)
Glucose: 85 mg/dL (ref 65–99)
Potassium: 4.1 mmol/L (ref 3.5–5.2)
Sodium: 137 mmol/L (ref 134–144)

## 2016-06-13 ENCOUNTER — Telehealth: Payer: Self-pay | Admitting: Cardiovascular Disease

## 2016-06-13 NOTE — Telephone Encounter (Signed)
New message    Pt is calling about her bp medication.   Pt c/o medication issue:  1. Name of Medication: amlodipine 10 mg   2. How are you currently taking this medication (dosage and times per day)? Once daily  3. Are you having a reaction (difficulty breathing--STAT)?   4. What is your medication issue? Pt states the medication is making her ankles swell.

## 2016-06-13 NOTE — Telephone Encounter (Signed)
Patient complaining of swollen and tight ankles after taking her Norvasc. Patient stated she stopped taking Norvasc 3 days ago, due to bilateral ankle swelling. Patient stated her swelling has not improved since she stopped the medication. Patient stated her BP today is 166/91 HR 90's. Encouraged patient to keep her legs elevated when sitting to help with swelling and to keep a low salt diet. Patient is coming in for BP check next week. Patient was wondering if she should take only half of the tablet, which would be 5 mg or try something else. Will forward to Dr. [name] for advisement.

## 2016-06-15 NOTE — Telephone Encounter (Signed)
Stop norvasc start aldomet 250 bid f/u nurse visit for BP 2 weeks

## 2016-06-16 ENCOUNTER — Other Ambulatory Visit: Payer: Self-pay | Admitting: Internal Medicine

## 2016-06-16 DIAGNOSIS — Z1231 Encounter for screening mammogram for malignant neoplasm of breast: Secondary | ICD-10-CM

## 2016-06-16 MED ORDER — METHYLDOPA 250 MG PO TABS: 250.0000 mg | ORAL_TABLET | Freq: Two times a day (BID) | ORAL | 11 refills | Status: DC

## 2016-06-16 NOTE — Telephone Encounter (Signed)
Left message for patient to call back  

## 2016-06-16 NOTE — Telephone Encounter (Signed)
Called patient back about Dr. Kyla Balzarine medication change and rescheduling her BP check appointment. Patient will come in on 07/02/16 for BP check. Sent 30 day prescription in for aldomet 250 mg BID to patient's pharmacy of choice. Patient verbalized understanding.

## 2016-06-16 NOTE — Telephone Encounter (Signed)
Follow up ° ° ° °Pt is returning call to Pam. °

## 2016-06-18 ENCOUNTER — Telehealth: Payer: Self-pay

## 2016-06-18 ENCOUNTER — Ambulatory Visit: Payer: Medicare Other

## 2016-06-18 NOTE — Telephone Encounter (Signed)
**Note De-Identified  Obfuscation** Methyldopa PA done through covermymeds. Awaiting response.

## 2016-06-20 NOTE — Telephone Encounter (Signed)
**Note De-Identified  Obfuscation** PA for Methyldopa has been approved by [employer]. Approval good from 02/16/2016 until 02/16/2017. Referral # [id number]

## 2016-06-24 ENCOUNTER — Other Ambulatory Visit: Payer: Self-pay | Admitting: Cardiovascular Disease

## 2016-06-24 DIAGNOSIS — I10 Essential (primary) hypertension: Secondary | ICD-10-CM

## 2016-06-24 DIAGNOSIS — I701 Atherosclerosis of renal artery: Secondary | ICD-10-CM

## 2016-06-25 ENCOUNTER — Telehealth: Payer: Self-pay | Admitting: Cardiovascular Disease

## 2016-06-25 NOTE — Telephone Encounter (Signed)
Informed patient that she is taking the right medication, that Aldomet if the brand name and methyldopa is the generic name. Patient verbalized understanding and will call back with any other questions or concerns.

## 2016-06-25 NOTE — Telephone Encounter (Signed)
New message   Pt is calling stating she picked up a new prescription. She said it is not what she was told it would be. She said she got methyldopa 250 mg. She is asking if this is right.

## 2016-07-02 ENCOUNTER — Ambulatory Visit (INDEPENDENT_AMBULATORY_CARE_PROVIDER_SITE_OTHER): Payer: Medicare Other | Admitting: Pharmacist

## 2016-07-02 ENCOUNTER — Encounter: Payer: Self-pay | Admitting: Pharmacist

## 2016-07-02 VITALS — BP 152/94 | HR 63

## 2016-07-02 DIAGNOSIS — I10 Essential (primary) hypertension: Secondary | ICD-10-CM

## 2016-07-02 DIAGNOSIS — I701 Atherosclerosis of renal artery: Secondary | ICD-10-CM

## 2016-07-02 MED ORDER — METHYLDOPA 500 MG PO TABS: 500.0000 mg | ORAL_TABLET | Freq: Two times a day (BID) | ORAL | 1 refills | Status: DC

## 2016-07-02 NOTE — Progress Notes (Signed)
Patient ID: NAKYRA BOURN                 DOB: [birth year]                      MRN: [id number]     HPI: [name] is a 83 y.o. female patient of Dr. [name] who presents today for hypertension evaluation. PMH includes chronic atrial fibrillation, CAD s/p Cypher DES to LAD in 2006, HTN, syncope, HL, GERD. She was started on amlodipine 70m daily at her last visit with Dr. NJohnsie Cancel She called shortly after starting with LLE and shortness of breath. Amlodipine was stopped and she was started on methyldopa 2539mBID.   She reports today that she has been doing well since the change over to methyldopa. She reports that her legs are no longer swollen, though her right leg does occasionally get puffy. She has been checking her pressures since her last visit and they are very tenuous. She had a period of pressures at goal right before amlodipine was discontinued, but since then has been running in 160s-170s/80-90s. Her HR has been stable in 60s-70s.   She says she has a long history of drug intolerances. She says one that she can not recall cause hallucinations and an "out of body experience."  Current HTN meds:  Methyldopa 25020mID - started May 9th (7-8am and 6-7pm) Metoprolol tartrate 12.5mg76mD - (7-8am and 6-7pm)  Previously tried: hydralazine - unknown  Lisinopril - cough, valsartan - unknown amlodipine - edema HCTZ - dehydration  BP goal: <140/90 (given age)  Family History: The patient's family history includes Diabetes in her mother; Heart attack in her mother; Hypertension in her brother, brother, sister, sister, sister, and sister; Lung cancer in her father; Thyroid disease in her sister and sister.  Social History: The patient  reports that she has quit smoking. Her smoking use included Cigarettes. She has never used smokeless tobacco. She reports that she does not drink alcohol or use drugs.  Diet: Most meals from home. Does not add salt to food. She has been avoiding ramen  since her last visit. Drinks 1 cup of coffee per morning. Drinks mostly water throughout the day. Rarely has a diet soda.   Exercise: Walks 20 minute every day.  Home BP readings: arm - last few days on methyldopa - 150-160s/80-90s.   Wt Readings from Last 3 Encounters:  06/03/16 169 lb 12.8 oz (77 kg)  04/16/15 174 lb 1.9 oz (79 kg)  04/11/15 176 lb 11.2 oz (80.2 kg)   BP Readings from Last 3 Encounters:  07/02/16 (!) 152/94  06/03/16 (!) 208/120  04/16/15 140/90   Pulse Readings from Last 3 Encounters:  07/02/16 63  06/03/16 100  04/16/15 74    Renal function: CrCl cannot be calculated (Patient's most recent lab result is older than the maximum 21 days allowed.).  Past Medical History:  Diagnosis Date  . Anxiety   . Arthritis   . Barrett's esophagus   . Chronic atrial fibrillation (HCC)[place] a. Pt previously declined DCCV.  . CoMarland Kitchenonary artery disease    a. s/p Cypher DES to LAD in 2006;  b. Last LHC 02/2008:  pLAD 30%, mLAD stent patent, pOM 50%, EF 65%;  c. Lexiscan Myoview 7/14:  Intermediate risk, dist ant and inf-lat ischemia, EF 66% - patient declined cath and elected medical therapy.  . GEMarland KitchenD (gastroesophageal reflux disease)   . Hiatal hernia   .  Hx of echocardiogram    Echocardiogram 08/17/12: EF 55-60%, MAC, mild MR, mild LAE, mild RVE, mild to moderate RAE, moderate TR, mildly increased pulmonary artery systolic pressures  . Hyperlipidemia   . Hypertension   . Mitral regurgitation    a. Mod by echo 10/2014.  . Obesity   . Pulmonary hypertension ([place])    a. Mod-severely elevated PA pressures by echo 10/2014.  Marland Kitchen QT prolongation    a. During 10/2014 admit, QTC 522m.  . Tricuspid regurgitation    a. Mod by echo 10/2014.  .Marland KitchenUTI (lower urinary tract infection) 03/2015    Current Outpatient Prescriptions on File Prior to Visit  Medication Sig Dispense Refill  . acetaminophen (TYLENOL) 500 MG tablet Take 500 mg by mouth every 6 (six) hours as needed for mild pain.    .  calcium carbonate (OS-CAL) 600 MG TABS Take 600 mg by mouth daily with breakfast.     . Cholecalciferol (VITAMIN D-3) 1000 UNITS CAPS Take 1 capsule by mouth daily.    .Marland KitchenLORazepam (ATIVAN) 0.5 MG tablet Take 1 tablet (0.5 mg total) by mouth every 8 (eight) hours as needed for anxiety. 30 tablet 3  . magnesium chloride (SLOW-MAG) 64 MG TBEC SR tablet Take 1 tablet (64 mg total) by mouth 2 (two) times daily. 60 tablet 0  . methyldopa (ALDOMET) 250 MG tablet Take 1 tablet (250 mg total) by mouth 2 (two) times daily. 60 tablet 11  . metoprolol tartrate (LOPRESSOR) 25 MG tablet Take 0.5 tablets (12.5 mg total) by mouth 2 (two) times daily. 90 tablet 3  . omeprazole (PRILOSEC) 20 MG capsule Take 20 mg by mouth daily.    .Marland Kitchenoxybutynin (DITROPAN) 5 MG tablet Take 5 mg by mouth daily.    . potassium chloride SA (K-DUR,KLOR-CON) 20 MEQ tablet Take 1 tablet (20 mEq total) by mouth daily. 30 tablet 0  . Rivaroxaban (XARELTO) 15 MG TABS tablet Take 1 tablet (15 mg total) by mouth daily. 30 tablet 6  . sodium-potassium bicarbonate (ALKA-SELTZER GOLD) TBEF dissolvable tablet Take 1 tablet by mouth daily as needed. For cold symptoms    . vitamin B-12 (CYANOCOBALAMIN) 1000 MCG tablet Take 1,000 mcg by mouth daily.    . vitamin C (ASCORBIC ACID) 500 MG tablet Take 500 mg by mouth daily.    . vitamin E 400 UNIT capsule Take 400 Units by mouth daily.    . nitroGLYCERIN (NITROSTAT) 0.4 MG SL tablet Place 1 tablet (0.4 mg total) under the tongue every 5 (five) minutes as needed for chest pain (3 doses only). (Patient not taking: Reported on 07/02/2016) 25 tablet 4   No current facility-administered medications on file prior to visit.     Allergies  Allergen Reactions  . Actonel [Risedronate Sodium] Other (See Comments)    Caused a lump to come up in throat  . Ciprofloxacin Hcl     hallucinations  . Hydralazine Hcl     Unknown, per pt   . Lisinopril Cough  . Oxycodone Hcl     hallucinations  . Tape Itching  .  Valsartan     Unknown, per pt  . Amlodipine Swelling    Edema in legs    Blood pressure (!) 152/94, pulse 63.   Assessment/Plan: Hypertension: BP today not at goal. Discussed that could consider restart of lower doses of amlodipine as swelling usually associated with higher doses. She prefers to avoid this medication unless absolutely needed. Will increase methyldopa to 5072mBID. Will  not adjust metoprolol at this time as previous syncopal episodes were thought to be related to low HR. Advised pt to monitor and follow up in 3 weeks.    Thank you, Lelan Pons. Patterson Hammersmith, [facility]  07/02/2016 10:50 AM

## 2016-07-02 NOTE — Patient Instructions (Addendum)
Return for a follow up appointment in 3-4 weeks  Check your blood pressure and heart rate at home daily (if able) and keep record of the readings.  Take your BP meds as follows: INCREASE methyldopa to 500mg  (you may take 2 of your current supply until you run out then pick up higher strength from pharmacy and take 1 tablet)  twice daily   Bring all of your meds, your BP cuff and your record of home blood pressures to your next appointment.  Exercise as you're able, try to walk approximately 30 minutes per day.  Keep salt intake to a minimum, especially watch canned and prepared boxed foods.  Eat more fresh fruits and vegetables and fewer canned items.  Avoid eating in fast food restaurants.    HOW TO TAKE YOUR BLOOD PRESSURE: . Rest 5 minutes before taking your blood pressure. .  Don't smoke or drink caffeinated beverages for at least 30 minutes before. . Take your blood pressure before (not after) you eat. . Sit comfortably with your back supported and both feet on the floor (don't cross your legs). . Elevate your arm to heart level on a table or a desk. . Use the proper sized cuff. It should fit smoothly and snugly around your bare upper arm. There should be enough room to slip a fingertip under the cuff. The bottom edge of the cuff should be 1 inch above the crease of the elbow. . Ideally, take 3 measurements at one sitting and record the average.

## 2016-07-04 ENCOUNTER — Ambulatory Visit (HOSPITAL_COMMUNITY)
Admission: RE | Admit: 2016-07-04 | Discharge: 2016-07-04 | Disposition: A | Discharge status: Home / Self Care | Payer: Medicare Other | Source: Ambulatory Visit | Attending: Internal Medicine | Admitting: Internal Medicine

## 2016-07-04 DIAGNOSIS — E785 Hyperlipidemia, unspecified: Secondary | ICD-10-CM | POA: Diagnosis not present

## 2016-07-04 DIAGNOSIS — I2510 Atherosclerotic heart disease of native coronary artery without angina pectoris: Secondary | ICD-10-CM | POA: Insufficient documentation

## 2016-07-04 DIAGNOSIS — Z87891 Personal history of nicotine dependence: Secondary | ICD-10-CM | POA: Diagnosis not present

## 2016-07-04 DIAGNOSIS — I10 Essential (primary) hypertension: Secondary | ICD-10-CM | POA: Diagnosis not present

## 2016-07-04 DIAGNOSIS — I701 Atherosclerosis of renal artery: Secondary | ICD-10-CM | POA: Diagnosis not present

## 2016-07-04 DIAGNOSIS — N281 Cyst of kidney, acquired: Secondary | ICD-10-CM | POA: Insufficient documentation

## 2016-07-04 DIAGNOSIS — I700 Atherosclerosis of aorta: Secondary | ICD-10-CM | POA: Insufficient documentation

## 2016-07-07 ENCOUNTER — Ambulatory Visit
Admission: RE | Admit: 2016-07-07 | Discharge: 2016-07-07 | Disposition: A | Discharge status: Home / Self Care | Payer: Medicare Other | Source: Ambulatory Visit | Attending: Internal Medicine | Admitting: Internal Medicine

## 2016-07-07 DIAGNOSIS — Z1231 Encounter for screening mammogram for malignant neoplasm of breast: Secondary | ICD-10-CM | POA: Diagnosis not present

## 2016-07-09 ENCOUNTER — Telehealth: Payer: Self-pay | Admitting: Cardiovascular Disease

## 2016-07-09 NOTE — Telephone Encounter (Signed)
New Message:   Please call,concerning the new medicine.She does not think she is going to be able to take it.

## 2016-07-09 NOTE — Telephone Encounter (Signed)
Spoke to patient. She reports that she has been having leg swelling and shortness of breath (especially when climbing stairs). She states that this has increased since starting amlodipine (which was stopped a few weeks ago now) and persisted since being on methyldopa. Peripheral edema is rarely reported with methyldopa. Advised she discontinue the methyldopa. She reports pressures remain elevated. Will increase her metoprolol tartrate to 1 tablet BID as she states this is the only medication she has tolerated well. Currently limited with medication options given lists of intolerances. Would consider rechallenge with lower dose of amlodipine or potentially ARB (since reaction to this is unknown) if unable to tolerate higher dose of metoprolol. She will monitor for syncopal episodes and monitor HR. Advised to proceed to ER if SOB/leg swelling worsens or does not improve in next few days. Keep follow up as scheduled with HTN clinic in 2 weeks. Advised to call with any concerns in the meantime. She states understanding and appreciation.

## 2016-07-23 ENCOUNTER — Ambulatory Visit (INDEPENDENT_AMBULATORY_CARE_PROVIDER_SITE_OTHER): Payer: Medicare Other | Admitting: Pharmacist

## 2016-07-23 VITALS — BP 154/102 | HR 73 | Wt 171.4 lb

## 2016-07-23 DIAGNOSIS — I10 Essential (primary) hypertension: Secondary | ICD-10-CM | POA: Diagnosis not present

## 2016-07-23 DIAGNOSIS — I701 Atherosclerosis of renal artery: Secondary | ICD-10-CM

## 2016-07-23 MED ORDER — METOPROLOL TARTRATE 25 MG PO TABS: 25.0000 mg | ORAL_TABLET | Freq: Two times a day (BID) | ORAL | 11 refills | Status: DC

## 2016-07-23 MED ORDER — SPIRONOLACTONE 25 MG PO TABS: 25.0000 mg | ORAL_TABLET | Freq: Every day | ORAL | 11 refills | Status: DC

## 2016-07-23 NOTE — Progress Notes (Signed)
Patient ID: TIPPI [id number]                 DOB: [birth year]                      MRN: [id number]     HPI: [name] is a 83 y.o. female patient of Dr. [name] who presents today for hypertension evaluation. PMH includes chronic atrial fibrillation, CAD s/p Cypher DES to LAD in 2006, HTN, syncope, HL, GERD. She was started on amlodipine 8m daily at her last visit with Dr. NJohnsie Cancelon 06/03/2016. She called shortly after starting with LLE and shortness of breath. Amlodipine was stopped and she was started on methyldopa 2566mBID on 06/13/2016. At her last HTN clinic appointment on 07/02/2016, methyldopa was increased to 500 mg BID for further BP control. Patient called on 07/09/2016 with complaints of increased LE edema and SOB (when climbing stairs), so methyldopa was discontinued and metoprolol dose was increased to 25 mg bid.    Patient presents today for BP follow-up after methyldopa d/c and metoprolol increase in good spirits without assistance. She states that the LE edema and SOB has improved since these medication changes, and are close to baseline. She has noticed this most significantly in being able to walk 20 min each day without stopping now, whereas while on amlodipine and methyldopa she was having to take breaks on these walks. She also mentioned that her weight increased at home with amlodipine start, but is now back to baseline.   She denies dizziness, HA, or blurred vision. She has not had any falls, and walks with cane when going on walks outside. She checks her BP at home every morning in both arms, and reports readings of SBP 151-191, DBP 81-116, HR 60-91, average 171/98. She states that her left arm is higher than right at home, however this isn't repeated every day on her home readings. We discussed losartan/hctz as an option because her daughter takes this, but she has listed intolerances to both of these agents. We decided together to not try a combination tablet at this time so  that we can attribute any intolerances to a single medication.   Current HTN meds:  Metoprolol tartrate 25 mg bid  Previously tried: hydralazine (unknown), lisinopril (cough), valsartan (unknown), amlodipine (edema), HCTZ (dehydration)   BP goal: < 140/90 (given age)   Family History: The patient's family history includes Diabetes in her mother; Heart attack in her mother; Hypertension in her brother, brother, sister, sister, sister, and sister; Lung cancer in her father; Thyroid disease in her sister and sister.  Social History: The patient reports that she has quit smoking. Her smoking use included Cigarettes. She has never used smokeless tobacco. She reports that she does not drink alcohol or use drugs.  Diet: Most meals from home. Does not add salt to food. She has been avoiding ramen since her last visit. Drinks 1 cup of coffee per morning. Drinks mostly water throughout the day. Rarely has a diet soda.  Exercise: Walks 20 minute every day.  Home BP readings: SBP 151-191, DBP 81-116, HR 60-91  Wt Readings from Last 3 Encounters:  07/23/16 171 lb 6.4 oz (77.7 kg)  06/03/16 169 lb 12.8 oz (77 kg)  04/16/15 174 lb 1.9 oz (79 kg)   BP Readings from Last 3 Encounters:  07/23/16 (!) 154/102  07/02/16 (!) 152/94  06/03/16 (!) 208/120   Pulse Readings from Last 3 Encounters:  07/23/16 73  07/02/16 63  06/03/16 100    Renal function: CrCl cannot be calculated (Patient's most recent lab result is older than the maximum 21 days allowed.).  Past Medical History:  Diagnosis Date  . Anxiety   . Arthritis   . Barrett's esophagus   . Chronic atrial fibrillation ([place])    a. Pt previously declined DCCV.  Marland Kitchen Coronary artery disease    a. s/p Cypher DES to LAD in 2006;  b. Last LHC 02/2008:  pLAD 30%, mLAD stent patent, pOM 50%, EF 65%;  c. Lexiscan Myoview 7/14:  Intermediate risk, dist ant and inf-lat ischemia, EF 66% - patient declined cath and elected medical therapy.  Marland Kitchen GERD  (gastroesophageal reflux disease)   . Hiatal hernia   . Hx of echocardiogram    Echocardiogram 08/17/12: EF 55-60%, MAC, mild MR, mild LAE, mild RVE, mild to moderate RAE, moderate TR, mildly increased pulmonary artery systolic pressures  . Hyperlipidemia   . Hypertension   . Mitral regurgitation    a. Mod by echo 10/2014.  . Obesity   . Pulmonary hypertension ([place])    a. Mod-severely elevated PA pressures by echo 10/2014.  Marland Kitchen QT prolongation    a. During 10/2014 admit, QTC 539m.  . Tricuspid regurgitation    a. Mod by echo 10/2014.  .Marland KitchenUTI (lower urinary tract infection) 03/2015    Current Outpatient Prescriptions on File Prior to Visit  Medication Sig Dispense Refill  . acetaminophen (TYLENOL) 500 MG tablet Take 500 mg by mouth every 6 (six) hours as needed for mild pain.    . calcium carbonate (OS-CAL) 600 MG TABS Take 600 mg by mouth daily with breakfast.     . Cholecalciferol (VITAMIN D-3) 1000 UNITS CAPS Take 1 capsule by mouth daily.    .Marland KitchenLORazepam (ATIVAN) 0.5 MG tablet Take 1 tablet (0.5 mg total) by mouth every 8 (eight) hours as needed for anxiety. 30 tablet 3  . magnesium chloride (SLOW-MAG) 64 MG TBEC SR tablet Take 1 tablet (64 mg total) by mouth 2 (two) times daily. 60 tablet 0  . methyldopa (ALDOMET) 500 MG tablet Take 1 tablet (500 mg total) by mouth 2 (two) times daily. 60 tablet 1  . metoprolol tartrate (LOPRESSOR) 25 MG tablet Take 0.5 tablets (12.5 mg total) by mouth 2 (two) times daily. 90 tablet 3  . nitroGLYCERIN (NITROSTAT) 0.4 MG SL tablet Place 1 tablet (0.4 mg total) under the tongue every 5 (five) minutes as needed for chest pain (3 doses only). (Patient not taking: Reported on 07/02/2016) 25 tablet 4  . omeprazole (PRILOSEC) 20 MG capsule Take 20 mg by mouth daily.    .Marland Kitchenoxybutynin (DITROPAN) 5 MG tablet Take 5 mg by mouth daily.    . Rivaroxaban (XARELTO) 15 MG TABS tablet Take 1 tablet (15 mg total) by mouth daily. 30 tablet 6  . sodium-potassium bicarbonate  (ALKA-SELTZER GOLD) TBEF dissolvable tablet Take 1 tablet by mouth daily as needed. For cold symptoms    . vitamin B-12 (CYANOCOBALAMIN) 1000 MCG tablet Take 1,000 mcg by mouth daily.    . vitamin C (ASCORBIC ACID) 500 MG tablet Take 500 mg by mouth daily.    . vitamin E 400 UNIT capsule Take 400 Units by mouth daily.     No current facility-administered medications on file prior to visit.     Allergies  Allergen Reactions  . Actonel [Risedronate Sodium] Other (See Comments)    Caused a lump to come up in throat  .  Ciprofloxacin Hcl     hallucinations  . Hydralazine Hcl     Unknown, per pt   . Lisinopril Cough  . Oxycodone Hcl     hallucinations  . Tape Itching  . Valsartan     Unknown, per pt  . Amlodipine Swelling    Edema in legs     Assessment/Plan:  1. Hypertension: BP not controlled, above goal of < 140/90. Given residual LE edema (L > R), will start spironolactone 25 mg daily and discontinue potassium supplement. Will check BMet 1 week to assess Scr and potassium. Patient and HR have tolerated increased metoprolol. Continue all other medications as prescribed. Instructed patient to call clinic with increased dizziness and light-headednes. Follow-up in 3 weeks with Dr. [name] as previously scheduled.  Belia Heman, PharmD PGY1 Resident 07/23/2016 1:37 PM  Patient seen with: Fuller Canada, PharmD, CPP, [place] [address], [place], [place] [address] Phone: [phone]; Fax: [phone]

## 2016-07-23 NOTE — Patient Instructions (Addendum)
It was great to meet you today!  Please stop taking potassium chloride and start taking spironolactone 25 mg daily.   Call the clinic if you have increase in dizziness or light-headedness.   Labs next week on Wednesday, June 13th - can come in any time between 7:30am-4:30pm.   Follow-up with Dr. [name] on June 29th

## 2016-07-23 NOTE — Progress Notes (Deleted)
Patient ID: [name]                 DOB: Mar 07, 1926                      MRN: [id number]     HPI: [name] is a 83 y.o. female referred by Dr. Marland Kitchen to HTN clinic. PMH is significant for  Current HTN meds:  Previously tried:  BP goal:   Family History:   Social History:   Diet:   Exercise:   Home BP readings:   Wt Readings from Last 3 Encounters:  06/03/16 169 lb 12.8 oz (77 kg)  04/16/15 174 lb 1.9 oz (79 kg)  04/11/15 176 lb 11.2 oz (80.2 kg)   BP Readings from Last 3 Encounters:  07/02/16 (!) 152/94  06/03/16 (!) 208/120  04/16/15 140/90   Pulse Readings from Last 3 Encounters:  07/02/16 63  06/03/16 100  04/16/15 74    Renal function: CrCl cannot be calculated (Patient's most recent lab result is older than the maximum 21 days allowed.).  Past Medical History:  Diagnosis Date  . Anxiety   . Arthritis   . Barrett's esophagus   . Chronic atrial fibrillation ([place])    a. Pt previously declined DCCV.  Marland Kitchen Coronary artery disease    a. s/p Cypher DES to LAD in 2006;  b. Last LHC 02/2008:  pLAD 30%, mLAD stent patent, pOM 50%, EF 65%;  c. Lexiscan Myoview 7/14:  Intermediate risk, dist ant and inf-lat ischemia, EF 66% - patient declined cath and elected medical therapy.  Marland Kitchen GERD (gastroesophageal reflux disease)   . Hiatal hernia   . Hx of echocardiogram    Echocardiogram 08/17/12: EF 55-60%, MAC, mild MR, mild LAE, mild RVE, mild to moderate RAE, moderate TR, mildly increased pulmonary artery systolic pressures  . Hyperlipidemia   . Hypertension   . Mitral regurgitation    a. Mod by echo 10/2014.  . Obesity   . Pulmonary hypertension ([place])    a. Mod-severely elevated PA pressures by echo 10/2014.  Marland Kitchen QT prolongation    a. During 10/2014 admit, QTC 551m.  . Tricuspid regurgitation    a. Mod by echo 10/2014.  .Marland KitchenUTI (lower urinary tract infection) 03/2015    Current Outpatient Prescriptions on File Prior to Visit  Medication Sig Dispense Refill  .  acetaminophen (TYLENOL) 500 MG tablet Take 500 mg by mouth every 6 (six) hours as needed for mild pain.    . calcium carbonate (OS-CAL) 600 MG TABS Take 600 mg by mouth daily with breakfast.     . Cholecalciferol (VITAMIN D-3) 1000 UNITS CAPS Take 1 capsule by mouth daily.    .Marland KitchenLORazepam (ATIVAN) 0.5 MG tablet Take 1 tablet (0.5 mg total) by mouth every 8 (eight) hours as needed for anxiety. 30 tablet 3  . magnesium chloride (SLOW-MAG) 64 MG TBEC SR tablet Take 1 tablet (64 mg total) by mouth 2 (two) times daily. 60 tablet 0  . methyldopa (ALDOMET) 500 MG tablet Take 1 tablet (500 mg total) by mouth 2 (two) times daily. 60 tablet 1  . metoprolol tartrate (LOPRESSOR) 25 MG tablet Take 0.5 tablets (12.5 mg total) by mouth 2 (two) times daily. 90 tablet 3  . nitroGLYCERIN (NITROSTAT) 0.4 MG SL tablet Place 1 tablet (0.4 mg total) under the tongue every 5 (five) minutes as needed for chest pain (3 doses only). (Patient not taking: Reported on 07/02/2016) 25  tablet 4  . omeprazole (PRILOSEC) 20 MG capsule Take 20 mg by mouth daily.    Marland Kitchen oxybutynin (DITROPAN) 5 MG tablet Take 5 mg by mouth daily.    . potassium chloride SA (K-DUR,KLOR-CON) 20 MEQ tablet Take 1 tablet (20 mEq total) by mouth daily. 30 tablet 0  . Rivaroxaban (XARELTO) 15 MG TABS tablet Take 1 tablet (15 mg total) by mouth daily. 30 tablet 6  . sodium-potassium bicarbonate (ALKA-SELTZER GOLD) TBEF dissolvable tablet Take 1 tablet by mouth daily as needed. For cold symptoms    . vitamin B-12 (CYANOCOBALAMIN) 1000 MCG tablet Take 1,000 mcg by mouth daily.    . vitamin C (ASCORBIC ACID) 500 MG tablet Take 500 mg by mouth daily.    . vitamin E 400 UNIT capsule Take 400 Units by mouth daily.     No current facility-administered medications on file prior to visit.     Allergies  Allergen Reactions  . Actonel [Risedronate Sodium] Other (See Comments)    Caused a lump to come up in throat  . Ciprofloxacin Hcl     hallucinations  .  Hydralazine Hcl     Unknown, per pt   . Lisinopril Cough  . Oxycodone Hcl     hallucinations  . Tape Itching  . Valsartan     Unknown, per pt  . Amlodipine Swelling    Edema in legs     Assessment/Plan:

## 2016-07-29 ENCOUNTER — Emergency Department (HOSPITAL_COMMUNITY): Payer: Medicare Other

## 2016-07-29 ENCOUNTER — Emergency Department (HOSPITAL_COMMUNITY)
Admission: EM | Admit: 2016-07-29 | Discharge: 2016-07-29 | Disposition: A | Discharge status: Home / Self Care | Payer: Medicare Other | Attending: Emergency Medicine | Admitting: Emergency Medicine

## 2016-07-29 ENCOUNTER — Telehealth: Payer: Self-pay | Admitting: Cardiovascular Disease

## 2016-07-29 ENCOUNTER — Encounter (HOSPITAL_COMMUNITY): Payer: Self-pay | Admitting: Emergency Medicine

## 2016-07-29 DIAGNOSIS — R410 Disorientation, unspecified: Secondary | ICD-10-CM | POA: Diagnosis not present

## 2016-07-29 DIAGNOSIS — F4489 Other dissociative and conversion disorders: Secondary | ICD-10-CM | POA: Diagnosis not present

## 2016-07-29 DIAGNOSIS — I10 Essential (primary) hypertension: Secondary | ICD-10-CM | POA: Diagnosis not present

## 2016-07-29 DIAGNOSIS — Z955 Presence of coronary angioplasty implant and graft: Secondary | ICD-10-CM | POA: Diagnosis not present

## 2016-07-29 DIAGNOSIS — G459 Transient cerebral ischemic attack, unspecified: Secondary | ICD-10-CM | POA: Insufficient documentation

## 2016-07-29 DIAGNOSIS — Z7901 Long term (current) use of anticoagulants: Secondary | ICD-10-CM | POA: Insufficient documentation

## 2016-07-29 DIAGNOSIS — Z87891 Personal history of nicotine dependence: Secondary | ICD-10-CM | POA: Insufficient documentation

## 2016-07-29 DIAGNOSIS — R29700 NIHSS score 0: Secondary | ICD-10-CM | POA: Diagnosis not present

## 2016-07-29 DIAGNOSIS — R030 Elevated blood-pressure reading, without diagnosis of hypertension: Secondary | ICD-10-CM | POA: Diagnosis not present

## 2016-07-29 DIAGNOSIS — I2510 Atherosclerotic heart disease of native coronary artery without angina pectoris: Secondary | ICD-10-CM | POA: Insufficient documentation

## 2016-07-29 DIAGNOSIS — Z79899 Other long term (current) drug therapy: Secondary | ICD-10-CM | POA: Insufficient documentation

## 2016-07-29 DIAGNOSIS — R4182 Altered mental status, unspecified: Secondary | ICD-10-CM | POA: Diagnosis present

## 2016-07-29 LAB — COMPREHENSIVE METABOLIC PANEL
ALK PHOS: 65 U/L (ref 38–126)
ALT: 15 U/L (ref 14–54)
ANION GAP: 11 (ref 5–15)
AST: 31 U/L (ref 15–41)
Albumin: 4.5 g/dL (ref 3.5–5.0)
BUN: 14 mg/dL (ref 6–20)
CALCIUM: 10 mg/dL (ref 8.9–10.3)
CHLORIDE: 97 mmol/L — AB (ref 101–111)
CO2: 24 mmol/L (ref 22–32)
Creatinine, Ser: 0.93 mg/dL (ref 0.44–1.00)
GFR calc non Af Amer: 53 mL/min — ABNORMAL LOW (ref >60)
Glucose, Bld: 106 mg/dL — ABNORMAL HIGH (ref 65–99)
Potassium: 3.9 mmol/L (ref 3.5–5.1)
SODIUM: 132 mmol/L — AB (ref 135–145)
Total Bilirubin: 1.2 mg/dL (ref 0.3–1.2)
Total Protein: 7.9 g/dL (ref 6.5–8.1)

## 2016-07-29 LAB — URINALYSIS, ROUTINE W REFLEX MICROSCOPIC
Bilirubin Urine: NEGATIVE
Glucose, UA: NEGATIVE mg/dL
Hgb urine dipstick: NEGATIVE
Ketones, ur: 5 mg/dL — AB
Leukocytes, UA: NEGATIVE
NITRITE: NEGATIVE
PH: 7 (ref 5.0–8.0)
Protein, ur: NEGATIVE mg/dL
SPECIFIC GRAVITY, URINE: 1.005 (ref 1.005–1.030)

## 2016-07-29 LAB — PROTIME-INR
INR: 1.3
Prothrombin Time: 16.3 seconds — ABNORMAL HIGH (ref 11.4–15.2)

## 2016-07-29 LAB — CBC
HCT: 41.1 % (ref 36.0–46.0)
Hemoglobin: 13.9 g/dL (ref 12.0–15.0)
MCH: 29.9 pg (ref 26.0–34.0)
MCHC: 33.8 g/dL (ref 30.0–36.0)
MCV: 88.4 fL (ref 78.0–100.0)
PLATELETS: 262 10*3/uL (ref 150–400)
RBC: 4.65 MIL/uL (ref 3.87–5.11)
RDW: 13.2 % (ref 11.5–15.5)
WBC: 8.3 10*3/uL (ref 4.0–10.5)

## 2016-07-29 LAB — DIFFERENTIAL
BASOS PCT: 1 %
Basophils Absolute: 0.1 10*3/uL (ref 0.0–0.1)
EOS PCT: 1 %
Eosinophils Absolute: 0.1 10*3/uL (ref 0.0–0.7)
Lymphocytes Relative: 22 %
Lymphs Abs: 1.8 10*3/uL (ref 0.7–4.0)
MONO ABS: 0.8 10*3/uL (ref 0.1–1.0)
Monocytes Relative: 10 %
Neutro Abs: 5.5 10*3/uL (ref 1.7–7.7)
Neutrophils Relative %: 66 %

## 2016-07-29 LAB — RAPID URINE DRUG SCREEN, HOSP PERFORMED
AMPHETAMINES: NOT DETECTED
Barbiturates: NOT DETECTED
Benzodiazepines: NOT DETECTED
Cocaine: NOT DETECTED
OPIATES: NOT DETECTED
Tetrahydrocannabinol: NOT DETECTED

## 2016-07-29 LAB — APTT: aPTT: 36 seconds (ref 24–36)

## 2016-07-29 LAB — ETHANOL

## 2016-07-29 LAB — I-STAT TROPONIN, ED: TROPONIN I, POC: 0.02 ng/mL (ref 0.00–0.08)

## 2016-07-29 NOTE — ED Triage Notes (Signed)
Pt here from home with an acute episode of confusion , pt is alert and oriented now , pt does have high b/p that is chronic for her

## 2016-07-29 NOTE — Telephone Encounter (Signed)
New message    Pt is having problem with a floater in her eye, and she is having problems with her speech, not able to form sentences

## 2016-07-29 NOTE — ED Provider Notes (Signed)
[facility] DEPT Provider Note   CSN: [id number] Arrival date & time: 07/29/16  1424     History   Chief Complaint Chief Complaint  Patient presents with  . Altered Mental Status    HPI [name] is a 83 y.o. female.  HPI Pt states she was at home today.  SHe felt like there was a floater across her vision. SHe did have a mild headache.  SHe noticed after that she couldn't form her words.  Her daughter was with her and she noticed that she was looking at her and was not speaking.  She would answer yes and no but could not form any other sentences.  Pt states she noticed it happen right after going upstairs about 12pm.  EMS was called and she came in by ambulance.  Sx resolved by that point. No numbness or weakness.  No other complaints.  Pt has had a few times in the past.  This has not happened recently. Past Medical History:  Diagnosis Date  . Anxiety   . Arthritis   . Barrett's esophagus   . Chronic atrial fibrillation ([place])    a. Pt previously declined DCCV.  Marland Kitchen Coronary artery disease    a. s/p Cypher DES to LAD in 2006;  b. Last LHC 02/2008:  pLAD 30%, mLAD stent patent, pOM 50%, EF 65%;  c. Lexiscan Myoview 7/14:  Intermediate risk, dist ant and inf-lat ischemia, EF 66% - patient declined cath and elected medical therapy.  Marland Kitchen GERD (gastroesophageal reflux disease)   . Hiatal hernia   . Hx of echocardiogram    Echocardiogram 08/17/12: EF 55-60%, MAC, mild MR, mild LAE, mild RVE, mild to moderate RAE, moderate TR, mildly increased pulmonary artery systolic pressures  . Hyperlipidemia   . Hypertension   . Mitral regurgitation    a. Mod by echo 10/2014.  . Obesity   . Pulmonary hypertension ([place])    a. Mod-severely elevated PA pressures by echo 10/2014.  Marland Kitchen QT prolongation    a. During 10/2014 admit, QTC 531m.  . Tricuspid regurgitation    a. Mod by echo 10/2014.  .Marland KitchenUTI (lower urinary tract infection) 03/2015    Patient Active Problem List   Diagnosis Date Noted    . Syncope 04/10/2015  . [place]TI [place] 04/10/[place]  . URI (upper respiratory infection) 04/10/2015  . UTI (lower urinary tract infection) 04/10/2015  . QT prolongation 10/12/2014  . Headache 09/02/2012  . Tricuspid valve regurgitation, moderate 08/18/2012  . Hypokalemia 08/16/2012  . Hyponatremia 08/16/2012  . Atrial fibrillation ([place]   . Hypertension   . Anxiety   . Mixed hyperlipidemia 09/11/2009  . GERD 08/30/2008  . CHEST PAIN 08/30/2008  . Coronary atherosclerosis 04/04/2008  . Syncope and collapse 04/04/2008    Past Surgical History:  Procedure Laterality Date  . ABDOMINAL HYSTERECTOMY    . ANKLE SURGERY    . BREAST EXCISIONAL BIOPSY Right 1970  . BREAST EXCISIONAL BIOPSY Right   . BREAST EXCISIONAL BIOPSY Left   . CESAREAN SECTION    . CORONARY ANGIOPLASTY    . ESOPHAGOGASTRODUODENOSCOPY      OB History    No data available       Home Medications    Prior to Admission medications   Medication Sig Start Date End Date Taking? Authorizing Provider  acetaminophen (TYLENOL) 500 MG tablet Take 500 mg by mouth every 6 (six) hours as needed for mild pain.    [provider]  calcium carbonate (OS-CAL) 600 MG TABS Take 600 mg by mouth daily with breakfast.     [provider]  Cholecalciferol (VITAMIN D-3) 1000 UNITS CAPS Take 1 capsule by mouth daily.    [provider]  LORazepam (ATIVAN) 0.5 MG tablet Take 1 tablet (0.5 mg total) by mouth every 8 (eight) hours as needed for anxiety. 10/12/14   Josue Hector, MD  magnesium chloride (SLOW-MAG) 64 MG TBEC SR tablet Take 1 tablet (64 mg total) by mouth 2 (two) times daily. 10/13/14   Janece Canterbury, MD  metoprolol tartrate (LOPRESSOR) 25 MG tablet Take 1 tablet (25 mg total) by mouth 2 (two) times daily. 07/23/16   Allred, Jeneen Rinks, MD  nitroGLYCERIN (NITROSTAT) 0.4 MG SL tablet Place 1 tablet (0.4 mg total) under the tongue every 5 (five) minutes as needed for chest pain (3 doses  only). Patient not taking: Reported on 07/02/2016 10/12/14   Josue Hector, MD  omeprazole (PRILOSEC) 20 MG capsule Take 20 mg by mouth daily.    [provider]  oxybutynin (DITROPAN) 5 MG tablet Take 5 mg by mouth daily.    [provider]  Rivaroxaban (XARELTO) 15 MG TABS tablet Take 1 tablet (15 mg total) by mouth daily. 09/28/12   Josue Hector, MD  sodium-potassium bicarbonate (ALKA-SELTZER GOLD) TBEF dissolvable tablet Take 1 tablet by mouth daily as needed. For cold symptoms    [provider]  spironolactone (ALDACTONE) 25 MG tablet Take 1 tablet (25 mg total) by mouth daily. 07/23/16 08/22/16  Allred, Jeneen Rinks, MD  vitamin B-12 (CYANOCOBALAMIN) 1000 MCG tablet Take 1,000 mcg by mouth daily.    [provider]  vitamin C (ASCORBIC ACID) 500 MG tablet Take 500 mg by mouth daily.    [provider]  vitamin E 400 UNIT capsule Take 400 Units by mouth daily.    [provider]    Family History Family History  Problem Relation Age of Onset  . Diabetes Mother   . Heart attack Mother   . Lung cancer Father   . Thyroid disease Sister   . Thyroid disease Sister   . Hypertension Sister   . Hypertension Sister   . Hypertension Sister   . Hypertension Sister   . Hypertension Brother   . Hypertension Brother   . Breast cancer Other 45  . Colon cancer Neg Hx   . Colon polyps Neg Hx   . Esophageal cancer Neg Hx   . Stroke Neg Hx     Social History Social History  Substance Use Topics  . Smoking status: Former Smoker    Types: Cigarettes  . Smokeless tobacco: Never Used  . Alcohol use No     Allergies   Actonel [risedronate sodium]; Ciprofloxacin hcl; Hydralazine hcl; Lisinopril; Oxycodone hcl; Tape; Valsartan; and Amlodipine   Review of Systems Review of Systems  All other systems reviewed and are negative.    Physical Exam Updated Vital Signs BP (!) 180/87 (BP Location: Left Arm)   Pulse 76   Temp 97.2 F (36.2 C)    Resp 16   SpO2 97%   Physical Exam  Constitutional: She is oriented to person, place, and time. She appears well-developed and well-nourished. No distress.  HENT:  Head: Normocephalic and atraumatic.  Right Ear: External ear normal.  Left Ear: External ear normal.  Mouth/Throat: Oropharynx is clear and moist.  Eyes: Conjunctivae are normal. Right eye exhibits no discharge. Left eye exhibits no discharge. No scleral icterus.  Neck: Neck supple. No tracheal deviation present.  Cardiovascular: Normal rate, regular rhythm and intact distal pulses.   Pulmonary/Chest: Effort normal and breath sounds normal. No stridor. No respiratory distress. She has no wheezes. She has no rales.  Abdominal: Soft. Bowel sounds are normal. She exhibits no distension. There is no tenderness. There is no rebound and no guarding.  Musculoskeletal: She exhibits no edema or tenderness.  Neurological: She is alert and oriented to person, place, and time. She has normal strength. No cranial nerve deficit (No facial droop, extraocular movements intact, tongue midline ) or sensory deficit. She exhibits normal muscle tone. She displays no seizure activity. Coordination normal.  No pronator drift bilateral upper extrem, able to hold both legs off bed for 5 seconds, sensation intact in all extremities, no visual field cuts, no left or right sided neglect, normal finger-nose exam bilaterally, no nystagmus noted  NIHSS = 0  Skin: Skin is warm and dry. No rash noted.  Psychiatric: She has a normal mood and affect.  Nursing note and vitals reviewed.    ED Treatments / Results  Labs (all labs ordered are listed, but only abnormal results are displayed) Labs Reviewed  PROTIME-INR - Abnormal; Notable for the following:       Result Value   Prothrombin Time 16.3 (*)    All other components within normal limits  COMPREHENSIVE METABOLIC PANEL - Abnormal; Notable for the following:    Sodium 132 (*)    Chloride 97 (*)     Glucose, Bld 106 (*)    GFR calc non Af Amer 53 (*)    All other components within normal limits  URINALYSIS, ROUTINE W REFLEX MICROSCOPIC - Abnormal; Notable for the following:    Color, Urine STRAW (*)    Ketones, ur 5 (*)    All other components within normal limits  ETHANOL  APTT  CBC  DIFFERENTIAL  RAPID URINE DRUG SCREEN, HOSP PERFORMED  I-STAT TROPOININ, ED    EKG  EKG Interpretation  Date/Time:  Tuesday July 29 2016 15:24:21 EDT Ventricular Rate:  71 PR Interval:    QRS Duration: 105 QT Interval:  395 QTC Calculation: 430 R Axis:   -54 Text Interpretation:  Atrial fibrillation LAD, consider left anterior fascicular block LVH with secondary repolarization abnormality Anterior Q waves, possibly due to LVH No significant change since last tracing Confirmed by Dorie Rank [id number]) on 07/29/2016 3:35:06 PM Also confirmed by Dorie Rank [phone]), editor Laurena Spies [phone])  on 07/29/2016 3:39:32 PM       Radiology Ct Head Wo Contrast  Result Date: 07/29/2016 CLINICAL DATA:  Episode of confusion and aphasia are earlier today. EXAM: CT HEAD WITHOUT CONTRAST TECHNIQUE: Contiguous axial images were obtained from the base of the skull through the vertex without intravenous contrast. COMPARISON:  04/10/2015 FINDINGS: Brain: No acute finding. Chronic small-vessel ischemic changes affecting the pons in the cerebral hemispheric deep white matter bilaterally. Old lacunar infarction right basal ganglia. No large vessel territory infarction. No mass lesion, hemorrhage, hydrocephalus or extra-axial collection. Vascular: There is atherosclerotic calcification of the major vessels at the base of the brain. Skull: Negative Sinuses/Orbits: Clear/normal Other: None significant IMPRESSION: No acute finding by CT. Chronic small-vessel ischemic changes as outlined above. Electronically Signed   By: Nelson Chimes M.D.   On: 07/29/2016 15:50    Procedures Procedures (including critical care  time)  Medications Ordered in ED Medications - No data to display   Initial Impression / Assessment and Plan /  ED Course  I have reviewed the triage vital signs and the nursing notes.  Pertinent labs & imaging results that were available during my care of the patient were reviewed by me and considered in my medical decision making (see chart for details).  Clinical Course as of Jul 30 1834  Tue Jul 29, 2016  1757 I'm concerned about the possibility of a TIA. I recommended admission for further workup. Patient is concerned about possible observation stay and the cost. She wants to talk to case manager.  [JK]    Clinical Course User Index [JK] Dorie Rank, MD   Patient presented to the emergency room with symptoms that were concerning for possible TIA. Her symptoms have all resolved here in the emergency room. CT scan does not show any acute findings. I discussed the options with the patient and her daughters about being admitted to the hospital for observation and further evaluation. She has had prior TIA workups but previous MRIs did show small strokes. Patient has plans to see her doctor tomorrow. She is concerned about the cost of hospitalization. Feels comfortable going home. She understands to return to the hospital immediately if she has any recurrent symptoms.   Final Clinical Impressions(s) / ED Diagnoses   Final diagnoses:  Transient cerebral ischemia, unspecified type    New Prescriptions New Prescriptions   No medications on file     Dorie Rank, MD 07/29/16 Bosie Helper

## 2016-07-29 NOTE — ED Notes (Signed)
Patient transported to CT 

## 2016-07-29 NOTE — Telephone Encounter (Signed)
Patient's daughter calling about patient having problems getting her speech out. Patient is slow to respond to questions and has no other deficits that the daughter is aware of. Daughter stated this just started happening. Informed daughter that this could possibly be a TIA or Stroke. Encouraged patient's daughter to call 911. Informed daughter, if she is having a stroke that the sooner she gets treatment, the better the out come. Patient's daughter verbalized understanding and will call 911.

## 2016-07-29 NOTE — Discharge Instructions (Signed)
Follow-up with your doctor tomorrow as planned. Return to the emergency room if you have any recurrent episodes or other concerns

## 2016-07-30 ENCOUNTER — Telehealth: Payer: Self-pay | Admitting: Cardiovascular Disease

## 2016-07-30 ENCOUNTER — Other Ambulatory Visit: Payer: Medicare Other | Admitting: *Deleted

## 2016-07-30 DIAGNOSIS — I10 Essential (primary) hypertension: Secondary | ICD-10-CM

## 2016-07-30 LAB — BASIC METABOLIC PANEL
BUN / CREAT RATIO: 21 (ref 12–28)
BUN: 21 mg/dL (ref 8–27)
CALCIUM: 9.7 mg/dL (ref 8.7–10.3)
CHLORIDE: 95 mmol/L — AB (ref 96–106)
CO2: 22 mmol/L (ref 20–29)
CREATININE: 1.02 mg/dL — AB (ref 0.57–1.00)
GFR, EST AFRICAN AMERICAN: 57 mL/min/{1.73_m2} — AB (ref >59)
GFR, EST NON AFRICAN AMERICAN: 49 mL/min/{1.73_m2} — AB (ref >59)
Glucose: 92 mg/dL (ref 65–99)
Potassium: 4 mmol/L (ref 3.5–5.2)
Sodium: 135 mmol/L (ref 134–144)

## 2016-07-30 NOTE — Telephone Encounter (Signed)
New message    Pt daughter is calling asking if pt needs to come for blood work today. She took her to the hospital yesterday and they did blood work there. Please call.

## 2016-07-30 NOTE — Telephone Encounter (Signed)
Patient here to have lab work done. Patient wanted an earlier appointment. Patient has f/u appt on 6/29 with Dr. [name] which is the earliest appointment that is available.

## 2016-08-14 NOTE — Progress Notes (Signed)
Patient ID: NORIE LATENDRESSE, female   DOB: December 16, 1926, 83 y.o.   MRN: [id number]    Cardiology Office Note Date:  08/15/2016 Patient ID:  Chaniece, Barbato 28-Oct-1926, MRN [id number] PCP:  Merrilee Seashore, MD  Cardiologist:  Dr. [name]   Chief Complaint:  syncope  History of Present Illness: ZAINAB CRUMRINE is a 83 y.o. female with history of chronic atrial fibrillation, CAD s/p Cypher DES to LAD in 2006, HTN, syncope, HL, GERD who presents for follow-up of syncope.She has a history of syncope with first episode in 2006 with subsequent stent placement as above. In 08/2012 she had near-syncope felt due to dehydration and orthostasis.  Last LHC 02/2008: pLAD 30%, mLAD stent patent, pOM 50%. She had a follow-up nuclear stress test in 2014 that was abnormal. However she decided to forego cardiac catheterization and opted for medical management. She also has a history of chronic atrial fibrillation and is on Xarelto for anticoagulation. It was recommended that she undergo direct-current cardioversion in the past, however, the patient declined. Her rate has been decently controlled with beta blocker therapy, but her metroprolol was recently reduced during 09/2014 admission to 12.5 mg twice a day due to issues with syncope and suspected bradycardia/hypotension.  She was  admitted 10/10/2014 for syncope evaluation. In the ED she was afebrile without any signs of infection. UA was negative as well as chest x-ray. CT of the head was also negative. ECG was without evidence of acute ischemia and troponins/d-dimer were negative. Telemetry demonstrated atrial fibrillation with occasional pauses and a prolonged QTc. The longest pause was 1.7 sec. Her metoprolol was reduced to 12.5 mg BID. Her K was 3.2-3.5 and Mg 1.5. She was seen in follow-up by Lyda Jester PA-C 10/26/14 at which time she was doing overall well, but still noting occasional episodes of near-syncope. QTc had improved to 478m (down from  5531m. 2D echo 10/31/14 showed mild LVH, EF 55-60%, no RWMA, mod MR, mild biatrial enlargment, mod TR, mod-severely elevated PA pressures (previously mildly increased). 48 Hour Holter showed atrial fib with average HR 80, slowest HR 42bpm at 1:12am, longest pause 2.34 seconds at 5am (a few pauses in the ~2sec range), fastest HR 137bpm, occasional PVCs (0.31% of the time). The patient did not report any symptoms while wearing the monitor.  04/13/15 Hospitalized for dehydration and UTI  HCTZ stopped also had URI    Last visit 05/2016 BP up has had multiple nurse visits. Had edema with norvasc and valsartan beta blocker Increased and started on aldactone   Seen in ER 07/29/16 for altered MS ? Aphasia and visual changes. CT negative not in afib preferred to be d/c Labs ok including negative troponin  She has a slew of intolerances for BP meds and has been fixated on it. Most resolve around urticaria and swelling Just stopped aldactone due to itching    Past Medical History:  Diagnosis Date  . Anxiety   . Arthritis   . Barrett's esophagus   . Chronic atrial fibrillation ([place]   a. Pt previously declined DCCV.  . Marland Kitchenoronary artery disease    a. s/p Cypher DES to LAD in 2006;  b. Last LHC 02/2008:  pLAD 30%, mLAD stent patent, pOM 50%, EF 65%;  c. Lexiscan Myoview 7/14:  Intermediate risk, dist ant and inf-lat ischemia, EF 66% - patient declined cath and elected medical therapy.  . Marland KitchenERD (gastroesophageal reflux disease)   . Hiatal hernia   . Hx of echocardiogram  Echocardiogram 08/17/12: EF 55-60%, MAC, mild MR, mild LAE, mild RVE, mild to moderate RAE, moderate TR, mildly increased pulmonary artery systolic pressures  . Hyperlipidemia   . Hypertension   . Mitral regurgitation    a. Mod by echo 10/2014.  . Obesity   . Pulmonary hypertension ([place])    a. Mod-severely elevated PA pressures by echo 10/2014.  Marland Kitchen QT prolongation    a. During 10/2014 admit, QTC 548m.  . Tricuspid regurgitation    a.  Mod by echo 10/2014.  .Marland KitchenUTI (lower urinary tract infection) 03/2015    Past Surgical History:  Procedure Laterality Date  . ABDOMINAL HYSTERECTOMY    . ANKLE SURGERY    . BREAST EXCISIONAL BIOPSY Right 1970  . BREAST EXCISIONAL BIOPSY Right   . BREAST EXCISIONAL BIOPSY Left   . CESAREAN SECTION    . CORONARY ANGIOPLASTY    . ESOPHAGOGASTRODUODENOSCOPY      Current Outpatient Prescriptions  Medication Sig Dispense Refill  . acetaminophen (TYLENOL) 500 MG tablet Take 500 mg by mouth every 6 (six) hours as needed for mild pain.    . calcium carbonate (OS-CAL) 600 MG TABS Take 600 mg by mouth daily with breakfast.     . Chlorphen-Phenyleph-ASA (ALKA-SELTZER PLUS COLD PO) Take 1 tablet by mouth daily as needed (cold symptoms).    . Cholecalciferol (VITAMIN D-3) 1000 UNITS CAPS Take 1 capsule by mouth daily.    .Marland KitchenLORazepam (ATIVAN) 0.5 MG tablet Take 1 tablet (0.5 mg total) by mouth every 8 (eight) hours as needed for anxiety. 30 tablet 3  . magnesium chloride (SLOW-MAG) 64 MG TBEC SR tablet Take 1 tablet (64 mg total) by mouth 2 (two) times daily. 60 tablet 0  . metoprolol tartrate (LOPRESSOR) 25 MG tablet Take 1 tablet (25 mg total) by mouth 2 (two) times daily. 60 tablet 11  . nitroGLYCERIN (NITROSTAT) 0.4 MG SL tablet Place 1 tablet (0.4 mg total) under the tongue every 5 (five) minutes as needed for chest pain (3 doses only). 25 tablet 4  . omeprazole (PRILOSEC) 20 MG capsule Take 20 mg by mouth daily.    .Marland Kitchenoxybutynin (DITROPAN) 5 MG tablet Take 5 mg by mouth daily.    . Rivaroxaban (XARELTO) 15 MG TABS tablet Take 1 tablet (15 mg total) by mouth daily. 30 tablet 6  . vitamin B-12 (CYANOCOBALAMIN) 1000 MCG tablet Take 1,000 mcg by mouth daily.    . vitamin C (ASCORBIC ACID) 500 MG tablet Take 500 mg by mouth daily.    . vitamin E 400 UNIT capsule Take 400 Units by mouth daily.     No current facility-administered medications for this visit.     Allergies:   Amlodipine; Methyldopa;  Actonel [risedronate sodium]; Ciprofloxacin hcl; Hydralazine hcl; Lisinopril; Oxycodone hcl; Spironolactone; Tape; and Valsartan   Social History:  The patient  reports that she has quit smoking. Her smoking use included Cigarettes. She has never used smokeless tobacco. She reports that she does not drink alcohol or use drugs.   Family History:  The patient's family history includes Breast cancer (age of onset: 47 in her other; Diabetes in her mother; Heart attack in her mother; Hypertension in her brother, brother, sister, sister, sister, and sister; Lung cancer in her father; Thyroid disease in her sister and sister.  ROS:  Please see the history of present illness. All other systems are reviewed and otherwise negative.   PHYSICAL EXAM:  BP (!) 150/82   Pulse 90  Ht _0  (1.651 m)   Wt 75.7 kg (166 lb 12.8 oz)   SpO2 98%   BMI 27.76 kg/m  Affect appropriate Healthy:  appears stated age 53: normal Neck supple with no adenopathy JVP normal no bruits no thyromegaly Lungs clear with no wheezing and good diaphragmatic motion Heart:  S1/S2 no murmur, no rub, gallop or click PMI normal Abdomen: benighn, BS positve, no tenderness, no AAA no bruit.  No HSM or HJR Distal pulses intact with no bruits Plus one LE edema Neuro non-focal Skin warm and dry No muscular weakness   EKG:  Atrial fib, 93bpm, possible prior septal infarct, left axis deviation, nonspecific changes but generally unchanged from prior, QTc 421m  06/03/16 Afib rate 87 LVH nonspecific ST/T wave changes LAD   Recent Labs: 06/03/2016: Magnesium 1.8 07/29/2016: ALT 15; Hemoglobin 13.9; Platelets 262 07/30/2016: BUN 21; Creatinine, Ser 1.02; Potassium 4.0; Sodium 135  No results found for requested labs within last 8760 hours.   Estimated Creatinine Clearance: 38.8 mL/min (A) (by C-G formula based on SCr of 1.02 mg/dL (H)).   Wt Readings from Last 3 Encounters:  08/15/16 75.7 kg (166 lb 12.8 oz)  07/23/16 77.7 kg  (171 lb 6.4 oz)  06/03/16 77 kg (169 lb 12.8 oz)     Other studies reviewed: Additional studies/records reviewed today include: summarized above  ASSESSMENT AND PLAN:  1. Syncope - possibly due to issues with bradycardia. Still on some beta blocker  No recurrent events since that time. Will observe carefully. If she has recurrence I would recommend EP evaluation to consider ILR.  2. Chronic atrial fibrillation with history of bradycardia - 48-hour monitor results as above. Continue anticoagulation. Continue lower dose of metoprolol.  We are unable to titrate AVN blocking agents due to issues with slower HRs as above.CAD s/p prior PCI - managed medically as above. No recent chest pain reported. On low dose xarelto due to age and fall risk  3. Pulmonary hypertension - recent d-dimer normal. She is on anticoagulation. Etiology not totally clear but patient wishes to defer any further invasive workup at this time. Pulse ox is acceptable with ambulation. 4. Hypomagnesemia/hypokalemia - improved off HCTZ 5. HTN: Has intolerances / allergy to ACE/ARB/Hydralazine/Aldactone will try to add HCTZ 12.5 mg  For BP and edema BMET in 4 weeks if she tolerates  6. TIA:  ? Normal neuro exam f/u primary consider f/u MRI      PJenkins Rouge

## 2016-08-15 ENCOUNTER — Encounter: Payer: Self-pay | Admitting: Cardiovascular Disease

## 2016-08-15 ENCOUNTER — Ambulatory Visit (INDEPENDENT_AMBULATORY_CARE_PROVIDER_SITE_OTHER): Payer: Medicare Other | Admitting: Cardiovascular Disease

## 2016-08-15 VITALS — BP 150/82 | HR 90 | Ht 65.0 in | Wt 166.8 lb

## 2016-08-15 DIAGNOSIS — Z79899 Other long term (current) drug therapy: Secondary | ICD-10-CM

## 2016-08-15 DIAGNOSIS — I10 Essential (primary) hypertension: Secondary | ICD-10-CM | POA: Diagnosis not present

## 2016-08-15 DIAGNOSIS — I701 Atherosclerosis of renal artery: Secondary | ICD-10-CM | POA: Diagnosis not present

## 2016-08-15 MED ORDER — METOPROLOL TARTRATE 25 MG PO TABS: 25.0000 mg | ORAL_TABLET | Freq: Two times a day (BID) | ORAL | 3 refills | Status: DC

## 2016-08-15 MED ORDER — HYDROCHLOROTHIAZIDE 12.5 MG PO CAPS: 12.5000 mg | ORAL_CAPSULE | Freq: Every day | ORAL | 11 refills | Status: DC

## 2016-08-15 NOTE — Patient Instructions (Signed)
Medication Instructions:  Your physician has recommended you make the following change in your medication:  1-Hydrochlorothiazide 12.5 mg by mouth daily  Lab work: Your physician recommends that you return for lab work in: 3 weeks - BMET  Testing/Procedures: NONE  Follow-Up: Your physician wants you to follow-up in: 6 months with Dr. [name]. You will receive a reminder letter in the mail two months in advance. If you don't receive a letter, please call our office to schedule the follow-up appointment.   If you need a refill on your cardiac medications before your next appointment, please call your pharmacy.

## 2016-08-21 ENCOUNTER — Telehealth: Payer: Self-pay | Admitting: Cardiovascular Disease

## 2016-08-21 MED ORDER — IRBESARTAN 75 MG PO TABS: 75.0000 mg | ORAL_TABLET | Freq: Every day | ORAL | 2 refills | Status: DC

## 2016-08-21 NOTE — Telephone Encounter (Signed)
Pt calling to report to Dr [name] and our HTN clinic that ever since she started taking HCTZ on 08/15/16, she has itching all over.  Pt reports there is no rash, hives, or any dermal reaction noted to her skin.  Pt reports she is not SOB, and has had no difficulty in breathing.  Pt states that the only issue she has, is itching all over.  Pt states "I'm 100 % sure that it's HCTZ only, for I'm sensitive to all these meds, and this is the only new med introduced into my regimen." Pt reports its "itching under the skin." Pt reports that she's used no new detergents or soaps. Pt would like for Dr [name] or someone in HTN clinic to stop this med and advise on a different regimen.  Advised the pt to stop taking this med.  Informed the pt that I will go and speak with our Pharmacist about complaints mentioned, and will follow-up with the pt shortly thereafter. Pt verbalized understanding and agrees with this plan.

## 2016-08-21 NOTE — Telephone Encounter (Signed)
Spoke with our Pharmacist in HTN clinic, who has seen and treated this pt before, to endorse complaint with new med start, HCTZ.  Per Fuller Canada PharmD, the pt should stop taking HCTZ, and start taking Irbesartan 75 mg po daily.  Pt has valsartan listed as an allergy, but reported by pt as "unknown reaction to this med." Also per our Pharmacist Megan, pt can try OTC Claritin, while symptoms of itching occur.  Endorsed recommendations to the pt.  Confirmed the pharmacy of choice with the pt.  Informed the pt that both Dr [name] and RN are out of the office this week, but I will forward this message to them for their review, and further follow-up, if needed.  Pt verbalized understanding and agrees with this plan.

## 2016-08-21 NOTE — Telephone Encounter (Signed)
[name] is calling she is having an allergic reaction to Hydrochlorothiazide  . States its making her itch all over . Please call

## 2016-09-01 ENCOUNTER — Telehealth: Payer: Self-pay | Admitting: Cardiovascular Disease

## 2016-09-01 NOTE — Telephone Encounter (Signed)
New message    Pt is calling with a question about some new medication that she was put on last week. Please call.

## 2016-09-01 NOTE — Telephone Encounter (Signed)
Called patient about her questions about irbesartan. Patient stated she is still having itching with irbesartan, just like she had with HCTZ. Patient stated she has been taking Claritin with her irbesartan and it helps some. Patient stated her itching is on her back and abdomen. Patient stated that her SBP 140's and 150's, and  DBP 90's. Consulted Pharmacist Georgina Peer, she recommend patient to keep taking irbesartan if she is tolerating and she can change from Claritin to allegra or zyrtec to see if they help with her symptoms. Patient has an appointment with HTN clinic on Monday, and she also has lab work scheduled too. Patient verbalized understanding and will continue to take medications as prescribed, and keep her appointments on Monday.

## 2016-09-08 ENCOUNTER — Ambulatory Visit (INDEPENDENT_AMBULATORY_CARE_PROVIDER_SITE_OTHER): Payer: Medicare Other | Admitting: Pharmacist

## 2016-09-08 ENCOUNTER — Other Ambulatory Visit: Payer: Medicare Other | Admitting: *Deleted

## 2016-09-08 VITALS — BP 142/84 | HR 82

## 2016-09-08 DIAGNOSIS — I10 Essential (primary) hypertension: Secondary | ICD-10-CM

## 2016-09-08 DIAGNOSIS — I701 Atherosclerosis of renal artery: Secondary | ICD-10-CM

## 2016-09-08 DIAGNOSIS — Z79899 Other long term (current) drug therapy: Secondary | ICD-10-CM | POA: Diagnosis not present

## 2016-09-08 NOTE — Patient Instructions (Addendum)
CONTINUE all medication as prescribed.   Call the clinic if you have any more issues with itching or if pressures trend up please call the clinic.

## 2016-09-08 NOTE — Progress Notes (Signed)
Patient ID: [name]                 DOB: 31-Mar-1926                      MRN: [id number]     HPI: [name] is a 83 y.o. female patient of Dr. Rita Ohara presents today for hypertension evaluation.PMH includes chronic atrial fibrillation, CAD s/p Cypher DES to LAD in 2006, HTN, syncope, HL, GERD. She was started on amlodipine 59m daily at her last visit with Dr. NJohnsie Cancelon 06/03/2016. She called shortly after starting with LLE and shortness of breath. Amlodipine was stopped and she was started on methyldopa 2531mBID on 06/13/2016. At her last HTN clinic appointment on 07/02/2016, methyldopa was increased to 500 mg BID for further BP control. Patient called on 07/09/2016 with complaints of increased LE edema and SOB (when climbing stairs), so methyldopa was discontinued and metoprolol dose was increased to 25 mg bid. She has since been changed to HCTZ which resulted in itching and then irebsartan for which she called in reporting itching as well.   She presents today and states she has improved over the weekend. Pt denies dizziness, HA, chest pain. She does have some SOB, but thinks that is related to weather and inactivity. She is still able to do stairs as normal.   She reports that middle of last week her itching stopped and she is doing well on irbesartan.    Current HTN meds:  Metoprolol tartrate 2516mID Irbesartan 57m91mily  (at lunch time)  Previously tried: hydralazine (unknown), lisinopril (cough), valsartan (unknown), amlodipine (edema), HCTZ (dehydration)   BP goal: < 140/90 (given age)   Family History: The patient's family history includes Diabetes in her mother; Heart attack in her mother; Hypertension in her brother, brother, sister, sister, sister, and sister; Lung cancer in her father; Thyroid disease in her sister and sister.  Social History: The patient reports that she has quit smoking. Her smoking use included Cigarettes. She has never used smokeless  tobacco. She reports that she does not drink alcohol or use drugs.  Diet: Most meals from home. Does not add salt to food. She has been avoiding ramen since her last visit. Drinks 1 cup of coffee per morning. Drinks mostly water throughout the day. Rarely has a diet soda.  Exercise: Walks 20 minute every day.  Home BP readings: 150s/80s HR 80s trending down to 140s/80s HR 80s  Wt Readings from Last 3 Encounters:  08/15/16 166 lb 12.8 oz (75.7 kg)  07/23/16 171 lb 6.4 oz (77.7 kg)  06/03/16 169 lb 12.8 oz (77 kg)   BP Readings from Last 3 Encounters:  09/08/16 (!) 148/82  08/15/16 (!) 150/82  07/29/16 (!) 180/87   Pulse Readings from Last 3 Encounters:  09/08/16 82  08/15/16 90  07/29/16 76    Renal function: CrCl cannot be calculated (Patient's most recent lab result is older than the maximum 21 days allowed.).  Past Medical History:  Diagnosis Date  . Anxiety   . Arthritis   . Barrett's esophagus   . Chronic atrial fibrillation ([facility])[place] a. Pt previously declined DCCV.  . CoMarland Kitchenonary artery disease    a. s/p Cypher DES to LAD in 2006;  b. Last LHC 02/2008:  pLAD 30%, mLAD stent patent, pOM 50%, EF 65%;  c. Lexiscan Myoview 7/14:  Intermediate risk, dist ant and inf-lat ischemia, EF 66% - patient declined  cath and elected medical therapy.  Marland Kitchen GERD (gastroesophageal reflux disease)   . Hiatal hernia   . Hx of echocardiogram    Echocardiogram 08/17/12: EF 55-60%, MAC, mild MR, mild LAE, mild RVE, mild to moderate RAE, moderate TR, mildly increased pulmonary artery systolic pressures  . Hyperlipidemia   . Hypertension   . Mitral regurgitation    a. Mod by echo 10/2014.  . Obesity   . Pulmonary hypertension ([place])    a. Mod-severely elevated PA pressures by echo 10/2014.  Marland Kitchen QT prolongation    a. During 10/2014 admit, QTC 534m.  . Tricuspid regurgitation    a. Mod by echo 10/2014.  .Marland KitchenUTI (lower urinary tract infection) 03/2015    Current Outpatient Prescriptions on File Prior to  Visit  Medication Sig Dispense Refill  . acetaminophen (TYLENOL) 500 MG tablet Take 500 mg by mouth every 6 (six) hours as needed for mild pain.    . calcium carbonate (OS-CAL) 600 MG TABS Take 600 mg by mouth daily with breakfast.     . Chlorphen-Phenyleph-ASA (ALKA-SELTZER PLUS COLD PO) Take 1 tablet by mouth daily as needed (cold symptoms).    . Cholecalciferol (VITAMIN D-3) 1000 UNITS CAPS Take 1 capsule by mouth daily.    . irbesartan (AVAPRO) 75 MG tablet Take 1 tablet (75 mg total) by mouth daily. 30 tablet 2  . LORazepam (ATIVAN) 0.5 MG tablet Take 1 tablet (0.5 mg total) by mouth every 8 (eight) hours as needed for anxiety. 30 tablet 3  . magnesium chloride (SLOW-MAG) 64 MG TBEC SR tablet Take 1 tablet (64 mg total) by mouth 2 (two) times daily. 60 tablet 0  . metoprolol tartrate (LOPRESSOR) 25 MG tablet Take 1 tablet (25 mg total) by mouth 2 (two) times daily. 180 tablet 3  . omeprazole (PRILOSEC) 20 MG capsule Take 20 mg by mouth daily.    .Marland Kitchenoxybutynin (DITROPAN) 5 MG tablet Take 5 mg by mouth daily.    . Rivaroxaban (XARELTO) 15 MG TABS tablet Take 1 tablet (15 mg total) by mouth daily. 30 tablet 6  . vitamin B-12 (CYANOCOBALAMIN) 1000 MCG tablet Take 1,000 mcg by mouth daily.    . vitamin C (ASCORBIC ACID) 500 MG tablet Take 500 mg by mouth daily.    . vitamin E 400 UNIT capsule Take 400 Units by mouth daily.    . nitroGLYCERIN (NITROSTAT) 0.4 MG SL tablet Place 1 tablet (0.4 mg total) under the tongue every 5 (five) minutes as needed for chest pain (3 doses only). (Patient not taking: Reported on 09/08/2016) 25 tablet 4   No current facility-administered medications on file prior to visit.     Allergies  Allergen Reactions  . Amlodipine Shortness Of Breath and Swelling    Edema in legs  . Methyldopa Shortness Of Breath and Swelling  . Actonel [Risedronate Sodium] Other (See Comments)    Caused a lump to come up in throat  . Ciprofloxacin Hcl     hallucinations  . Hctz  [Hydrochlorothiazide] Itching    Pt reports causing "itching under the skin, with no rash noted."  . Hydralazine Hcl     Unknown, per pt   . Lisinopril Cough  . Oxycodone Hcl     hallucinations  . Spironolactone     Itchiing  . Tape Itching  . Valsartan     Unknown, per pt    Blood pressure (!) 148/82, pulse 82, SpO2 98 %.   Assessment/Plan: Hypertension: BP today borderline at  goal. BMET today. Will continue all medications as prescribed. Advised if has additional issues with itching to call clinic or if pressure trend up to call to be seen sooner. Follow up with Dr. [name] as planned and HTN clinic as needed.    Thank you, Lelan Pons. Patterson Hammersmith, [facility]  09/08/2016 1:30 PM

## 2016-09-09 LAB — BASIC METABOLIC PANEL
BUN/Creatinine Ratio: 15 (ref 12–28)
BUN: 13 mg/dL (ref 8–27)
CALCIUM: 9.8 mg/dL (ref 8.7–10.3)
CO2: 24 mmol/L (ref 20–29)
Chloride: 98 mmol/L (ref 96–106)
Creatinine, Ser: 0.87 mg/dL (ref 0.57–1.00)
GFR calc Af Amer: 69 mL/min/{1.73_m2} (ref >59)
GFR, EST NON AFRICAN AMERICAN: 60 mL/min/{1.73_m2} (ref >59)
Glucose: 86 mg/dL (ref 65–99)
POTASSIUM: 4.6 mmol/L (ref 3.5–5.2)
Sodium: 138 mmol/L (ref 134–144)

## 2016-09-16 ENCOUNTER — Other Ambulatory Visit: Payer: Self-pay | Admitting: Cardiovascular Disease

## 2016-09-19 DIAGNOSIS — H353131 Nonexudative age-related macular degeneration, bilateral, early dry stage: Secondary | ICD-10-CM | POA: Diagnosis not present

## 2016-09-19 DIAGNOSIS — H04123 Dry eye syndrome of bilateral lacrimal glands: Secondary | ICD-10-CM | POA: Diagnosis not present

## 2016-09-19 DIAGNOSIS — H25013 Cortical age-related cataract, bilateral: Secondary | ICD-10-CM | POA: Diagnosis not present

## 2016-09-19 DIAGNOSIS — H2513 Age-related nuclear cataract, bilateral: Secondary | ICD-10-CM | POA: Diagnosis not present

## 2016-09-19 DIAGNOSIS — H35033 Hypertensive retinopathy, bilateral: Secondary | ICD-10-CM | POA: Diagnosis not present

## 2016-09-22 DIAGNOSIS — I2510 Atherosclerotic heart disease of native coronary artery without angina pectoris: Secondary | ICD-10-CM | POA: Diagnosis not present

## 2016-09-22 DIAGNOSIS — I481 Persistent atrial fibrillation: Secondary | ICD-10-CM | POA: Diagnosis not present

## 2016-09-29 DIAGNOSIS — I481 Persistent atrial fibrillation: Secondary | ICD-10-CM | POA: Diagnosis not present

## 2016-09-29 DIAGNOSIS — I2510 Atherosclerotic heart disease of native coronary artery without angina pectoris: Secondary | ICD-10-CM | POA: Diagnosis not present

## 2016-09-29 DIAGNOSIS — I10 Essential (primary) hypertension: Secondary | ICD-10-CM | POA: Diagnosis not present

## 2016-10-27 DIAGNOSIS — C44329 Squamous cell carcinoma of skin of other parts of face: Secondary | ICD-10-CM | POA: Diagnosis not present

## 2016-10-27 DIAGNOSIS — D1801 Hemangioma of skin and subcutaneous tissue: Secondary | ICD-10-CM | POA: Diagnosis not present

## 2016-10-27 DIAGNOSIS — Z85828 Personal history of other malignant neoplasm of skin: Secondary | ICD-10-CM | POA: Diagnosis not present

## 2016-10-27 DIAGNOSIS — L821 Other seborrheic keratosis: Secondary | ICD-10-CM | POA: Diagnosis not present

## 2016-10-27 DIAGNOSIS — L570 Actinic keratosis: Secondary | ICD-10-CM | POA: Diagnosis not present

## 2016-10-27 DIAGNOSIS — B351 Tinea unguium: Secondary | ICD-10-CM | POA: Diagnosis not present

## 2016-10-27 DIAGNOSIS — L814 Other melanin hyperpigmentation: Secondary | ICD-10-CM | POA: Diagnosis not present

## 2016-10-27 DIAGNOSIS — D485 Neoplasm of uncertain behavior of skin: Secondary | ICD-10-CM | POA: Diagnosis not present

## 2016-10-27 DIAGNOSIS — D225 Melanocytic nevi of trunk: Secondary | ICD-10-CM | POA: Diagnosis not present

## 2016-12-04 DIAGNOSIS — N390 Urinary tract infection, site not specified: Secondary | ICD-10-CM | POA: Diagnosis not present

## 2016-12-04 DIAGNOSIS — R350 Frequency of micturition: Secondary | ICD-10-CM | POA: Diagnosis not present

## 2016-12-16 DIAGNOSIS — C44329 Squamous cell carcinoma of skin of other parts of face: Secondary | ICD-10-CM | POA: Diagnosis not present

## 2017-01-19 DIAGNOSIS — Z23 Encounter for immunization: Secondary | ICD-10-CM | POA: Diagnosis not present

## 2017-01-19 DIAGNOSIS — Z0000 Encounter for general adult medical examination without abnormal findings: Secondary | ICD-10-CM | POA: Diagnosis not present

## 2017-01-19 DIAGNOSIS — I10 Essential (primary) hypertension: Secondary | ICD-10-CM | POA: Diagnosis not present

## 2017-01-19 DIAGNOSIS — I2510 Atherosclerotic heart disease of native coronary artery without angina pectoris: Secondary | ICD-10-CM | POA: Diagnosis not present

## 2017-01-19 DIAGNOSIS — I481 Persistent atrial fibrillation: Secondary | ICD-10-CM | POA: Diagnosis not present

## 2017-01-30 ENCOUNTER — Encounter: Payer: Self-pay | Admitting: Cardiovascular Disease

## 2017-02-01 NOTE — Progress Notes (Signed)
Patient ID: [name], female   DOB: September 19, 1926, 83 y.o.   MRN: [id number]    Cardiology Office Note Date:  02/04/2017 Patient ID:  [name] 1926-11-21, MRN [id number] PCP:  Merrilee Seashore, MD  Cardiologist:  Dr. [name]   Chief Complaint:  syncope  History of Present Illness: [name] is a 83 y.o. female with history of chronic atrial fibrillation, CAD s/p Cypher DES to LAD in 2006, HTN, syncope, HL, GERD who presents for follow-up .She has a history of syncope with first episode in 2006 with subsequent stent placement as above. In 08/2012 she had near-syncope felt due to dehydration and orthostasis.  Last LHC 02/2008: pLAD 30%, mLAD stent patent, pOM 50%. She had a follow-up nuclear stress test in 2014 that was abnormal. However she decided to forego cardiac catheterization and opted for medical management. She also has a history of chronic atrial fibrillation and is on Xarelto for anticoagulation. It was recommended that she undergo direct-current cardioversion in the past, however, the patient declined. Her rate has been decently controlled with beta blocker therapy, but her metroprolol was recently reduced during 09/2014 admission to 12.5 mg twice a day due to issues with syncope and suspected bradycardia/hypotension.  BP labile with multiple side effects from meds including pruritis, dehydration, edema   No cardiac complaints had flu shot    Past Medical History:  Diagnosis Date  . Anxiety   . Arthritis   . Barrett's esophagus   . Chronic atrial fibrillation ([place])    a. Pt previously declined DCCV.  Marland Kitchen Coronary artery disease    a. s/p Cypher DES to LAD in 2006;  b. Last LHC 02/2008:  pLAD 30%, mLAD stent patent, pOM 50%, EF 65%;  c. Lexiscan Myoview 7/14:  Intermediate risk, dist ant and inf-lat ischemia, EF 66% - patient declined cath and elected medical therapy.  Marland Kitchen GERD (gastroesophageal reflux disease)   . Hiatal hernia   . Hx of echocardiogram    Echocardiogram 08/17/12: EF 55-60%, MAC, mild MR, mild LAE, mild RVE, mild to moderate RAE, moderate TR, mildly increased pulmonary artery systolic pressures  . Hyperlipidemia   . Hypertension   . Mitral regurgitation    a. Mod by echo 10/2014.  . Obesity   . Pulmonary hypertension ([place])    a. Mod-severely elevated PA pressures by echo 10/2014.  Marland Kitchen QT prolongation    a. During 10/2014 admit, QTC 517m.  . Tricuspid regurgitation    a. Mod by echo 10/2014.  .Marland KitchenUTI (lower urinary tract infection) 03/2015    Past Surgical History:  Procedure Laterality Date  . ABDOMINAL HYSTERECTOMY    . ANKLE SURGERY    . BREAST EXCISIONAL BIOPSY Right 1970  . BREAST EXCISIONAL BIOPSY Right   . BREAST EXCISIONAL BIOPSY Left   . CESAREAN SECTION    . CORONARY ANGIOPLASTY    . ESOPHAGOGASTRODUODENOSCOPY      Current Outpatient Medications  Medication Sig Dispense Refill  . acetaminophen (TYLENOL) 500 MG tablet Take 500 mg by mouth every 6 (six) hours as needed for mild pain.    . calcium carbonate (OS-CAL) 600 MG TABS Take 600 mg by mouth daily with breakfast.     . Chlorphen-Phenyleph-ASA (ALKA-SELTZER PLUS COLD PO) Take 1 tablet by mouth daily as needed (cold symptoms).    . Cholecalciferol (VITAMIN D-3) 1000 UNITS CAPS Take 1 capsule by mouth daily.    .Marland KitchenLORazepam (ATIVAN) 0.5 MG tablet Take 1 tablet (0.5 mg total)  by mouth every 8 (eight) hours as needed for anxiety. 30 tablet 3  . magnesium chloride (SLOW-MAG) 64 MG TBEC SR tablet Take 1 tablet (64 mg total) by mouth 2 (two) times daily. 60 tablet 0  . metoprolol tartrate (LOPRESSOR) 25 MG tablet Take 1 tablet (25 mg total) by mouth 2 (two) times daily. 180 tablet 3  . nitroGLYCERIN (NITROSTAT) 0.4 MG SL tablet Place 1 tablet (0.4 mg total) under the tongue every 5 (five) minutes as needed for chest pain (3 doses only). 25 tablet 4  . omeprazole (PRILOSEC) 20 MG capsule Take 20 mg by mouth daily.    Marland Kitchen oxybutynin (DITROPAN) 5 MG tablet Take 5 mg by mouth  daily.    . potassium chloride SA (K-DUR,KLOR-CON) 20 MEQ tablet Take 1 tablet by mouth daily.    . Rivaroxaban (XARELTO) 15 MG TABS tablet Take 1 tablet (15 mg total) by mouth daily. 30 tablet 6  . telmisartan (MICARDIS) 20 MG tablet Take 1 tablet by mouth daily.    . vitamin B-12 (CYANOCOBALAMIN) 1000 MCG tablet Take 1,000 mcg by mouth daily.    . vitamin C (ASCORBIC ACID) 500 MG tablet Take 500 mg by mouth daily.    . vitamin E 400 UNIT capsule Take 400 Units by mouth daily.     No current facility-administered medications for this visit.     Allergies:   Amlodipine; Methyldopa; Actonel [risedronate sodium]; Ciprofloxacin hcl; Hctz [hydrochlorothiazide]; Hydralazine hcl; Lisinopril; Oxycodone hcl; Spironolactone; Tape; and Valsartan   Social History:  The patient  reports that she has quit smoking. Her smoking use included cigarettes. she has never used smokeless tobacco. She reports that she does not drink alcohol or use drugs.   Family History:  The patient's family history includes Breast cancer (age of onset: 21) in her other; Diabetes in her mother; Heart attack in her mother; Hypertension in her brother, brother, sister, sister, sister, and sister; Lung cancer in her father; Thyroid disease in her sister and sister.  ROS:  Please see the history of present illness. All other systems are reviewed and otherwise negative.   PHYSICAL EXAM:  BP 132/80   Pulse 65   Ht 5' 5"  (1.651 m)   Wt 168 lb 8 oz (76.4 kg)   SpO2 98%   BMI 28.04 kg/m  Affect appropriate Healthy:  appears stated age 52: normal Neck supple with no adenopathy JVP normal no bruits no thyromegaly Lungs clear with no wheezing and good diaphragmatic motion Heart:  S1/S2 no murmur, no rub, gallop or click PMI normal Abdomen: benighn, BS positve, no tenderness, no AAA no bruit.  No HSM or HJR Distal pulses intact with no bruits No edema Neuro non-focal Skin warm and dry No muscular weakness    EKG:   Atrial fib, 93bpm, possible prior septal infarct, left axis deviation, nonspecific changes but generally unchanged from prior, QTc 438m  06/03/16 Afib rate 87 LVH nonspecific ST/T wave changes LAD   Recent Labs: 06/03/2016: Magnesium 1.8 07/29/2016: ALT 15; Hemoglobin 13.9; Platelets 262 09/08/2016: BUN 13; Creatinine, Ser 0.87; Potassium 4.6; Sodium 138  No results found for requested labs within last 8760 hours.   CrCl cannot be calculated (Patient's most recent lab result is older than the maximum 21 days allowed.).   Wt Readings from Last 3 Encounters:  02/04/17 168 lb 8 oz (76.4 kg)  08/15/16 166 lb 12.8 oz (75.7 kg)  07/23/16 171 lb 6.4 oz (77.7 kg)     Other studies  reviewed: Additional studies/records reviewed today include: summarized above  ASSESSMENT AND PLAN:  1. Syncope - non cardiac improved observe  2. Chronic atrial fibrillation with history of bradycardia - stable avoid high dose AV nodal drugs 3. Pulmonary hypertension - recent d-dimer normal. She is on anticoagulation. Etiology not totally clear but patient wishes to defer any further invasive workup at this time. Pulse ox is acceptable with ambulation. 4. HTN: Has intolerances / allergy to ACE/ARB/Hydralazine/Aldactone currently normal low sodium diet     Jenkins Rouge

## 2017-02-02 DIAGNOSIS — Z85828 Personal history of other malignant neoplasm of skin: Secondary | ICD-10-CM | POA: Diagnosis not present

## 2017-02-02 DIAGNOSIS — L814 Other melanin hyperpigmentation: Secondary | ICD-10-CM | POA: Diagnosis not present

## 2017-02-02 DIAGNOSIS — L570 Actinic keratosis: Secondary | ICD-10-CM | POA: Diagnosis not present

## 2017-02-04 ENCOUNTER — Ambulatory Visit (INDEPENDENT_AMBULATORY_CARE_PROVIDER_SITE_OTHER): Payer: Medicare Other | Admitting: Cardiovascular Disease

## 2017-02-04 ENCOUNTER — Encounter: Payer: Self-pay | Admitting: Cardiovascular Disease

## 2017-02-04 VITALS — BP 132/80 | HR 65 | Ht 65.0 in | Wt 168.5 lb

## 2017-02-04 DIAGNOSIS — I482 Chronic atrial fibrillation, unspecified: Secondary | ICD-10-CM

## 2017-02-04 DIAGNOSIS — I2510 Atherosclerotic heart disease of native coronary artery without angina pectoris: Secondary | ICD-10-CM

## 2017-02-04 DIAGNOSIS — I10 Essential (primary) hypertension: Secondary | ICD-10-CM

## 2017-02-04 DIAGNOSIS — Z9861 Coronary angioplasty status: Secondary | ICD-10-CM | POA: Diagnosis not present

## 2017-02-04 DIAGNOSIS — I701 Atherosclerosis of renal artery: Secondary | ICD-10-CM | POA: Diagnosis not present

## 2017-02-04 NOTE — Patient Instructions (Signed)

## 2017-02-25 DIAGNOSIS — I481 Persistent atrial fibrillation: Secondary | ICD-10-CM | POA: Diagnosis not present

## 2017-02-25 DIAGNOSIS — E876 Hypokalemia: Secondary | ICD-10-CM | POA: Diagnosis not present

## 2017-02-25 DIAGNOSIS — I10 Essential (primary) hypertension: Secondary | ICD-10-CM | POA: Diagnosis not present

## 2017-02-25 DIAGNOSIS — M159 Polyosteoarthritis, unspecified: Secondary | ICD-10-CM | POA: Diagnosis not present

## 2017-02-25 DIAGNOSIS — I2510 Atherosclerotic heart disease of native coronary artery without angina pectoris: Secondary | ICD-10-CM | POA: Diagnosis not present

## 2017-02-26 ENCOUNTER — Other Ambulatory Visit: Payer: Self-pay | Admitting: Cardiovascular Disease

## 2017-04-03 DIAGNOSIS — H25013 Cortical age-related cataract, bilateral: Secondary | ICD-10-CM | POA: Diagnosis not present

## 2017-04-03 DIAGNOSIS — H353131 Nonexudative age-related macular degeneration, bilateral, early dry stage: Secondary | ICD-10-CM | POA: Diagnosis not present

## 2017-04-03 DIAGNOSIS — H2513 Age-related nuclear cataract, bilateral: Secondary | ICD-10-CM | POA: Diagnosis not present

## 2017-04-03 DIAGNOSIS — H25042 Posterior subcapsular polar age-related cataract, left eye: Secondary | ICD-10-CM | POA: Diagnosis not present

## 2017-06-28 ENCOUNTER — Other Ambulatory Visit: Payer: Self-pay | Admitting: Cardiovascular Disease

## 2017-06-28 DIAGNOSIS — I10 Essential (primary) hypertension: Secondary | ICD-10-CM

## 2017-06-29 DIAGNOSIS — Z85828 Personal history of other malignant neoplasm of skin: Secondary | ICD-10-CM | POA: Diagnosis not present

## 2017-06-29 DIAGNOSIS — D485 Neoplasm of uncertain behavior of skin: Secondary | ICD-10-CM | POA: Diagnosis not present

## 2017-07-03 ENCOUNTER — Other Ambulatory Visit: Payer: Self-pay | Admitting: Internal Medicine

## 2017-07-03 DIAGNOSIS — Z1231 Encounter for screening mammogram for malignant neoplasm of breast: Secondary | ICD-10-CM

## 2017-07-07 DIAGNOSIS — H6121 Impacted cerumen, right ear: Secondary | ICD-10-CM | POA: Diagnosis not present

## 2017-07-07 DIAGNOSIS — Z85828 Personal history of other malignant neoplasm of skin: Secondary | ICD-10-CM | POA: Diagnosis not present

## 2017-07-07 DIAGNOSIS — Z87891 Personal history of nicotine dependence: Secondary | ICD-10-CM | POA: Diagnosis not present

## 2017-08-07 ENCOUNTER — Ambulatory Visit
Admission: RE | Admit: 2017-08-07 | Discharge: 2017-08-07 | Disposition: A | Discharge status: Home / Self Care | Payer: Medicare Other | Source: Ambulatory Visit | Attending: Internal Medicine | Admitting: Internal Medicine

## 2017-08-07 DIAGNOSIS — Z1231 Encounter for screening mammogram for malignant neoplasm of breast: Secondary | ICD-10-CM | POA: Diagnosis not present

## 2017-09-16 DIAGNOSIS — I2510 Atherosclerotic heart disease of native coronary artery without angina pectoris: Secondary | ICD-10-CM | POA: Diagnosis not present

## 2017-09-16 DIAGNOSIS — I10 Essential (primary) hypertension: Secondary | ICD-10-CM | POA: Diagnosis not present

## 2017-09-16 DIAGNOSIS — I481 Persistent atrial fibrillation: Secondary | ICD-10-CM | POA: Diagnosis not present

## 2017-09-16 DIAGNOSIS — M159 Polyosteoarthritis, unspecified: Secondary | ICD-10-CM | POA: Diagnosis not present

## 2017-09-16 DIAGNOSIS — N390 Urinary tract infection, site not specified: Secondary | ICD-10-CM | POA: Diagnosis not present

## 2017-09-16 DIAGNOSIS — R350 Frequency of micturition: Secondary | ICD-10-CM | POA: Diagnosis not present

## 2017-09-21 DIAGNOSIS — L578 Other skin changes due to chronic exposure to nonionizing radiation: Secondary | ICD-10-CM | POA: Diagnosis not present

## 2017-09-21 DIAGNOSIS — L570 Actinic keratosis: Secondary | ICD-10-CM | POA: Diagnosis not present

## 2017-09-21 DIAGNOSIS — L814 Other melanin hyperpigmentation: Secondary | ICD-10-CM | POA: Diagnosis not present

## 2017-09-21 DIAGNOSIS — L308 Other specified dermatitis: Secondary | ICD-10-CM | POA: Diagnosis not present

## 2017-09-21 DIAGNOSIS — L821 Other seborrheic keratosis: Secondary | ICD-10-CM | POA: Diagnosis not present

## 2017-09-23 DIAGNOSIS — I2510 Atherosclerotic heart disease of native coronary artery without angina pectoris: Secondary | ICD-10-CM | POA: Diagnosis not present

## 2017-09-23 DIAGNOSIS — I481 Persistent atrial fibrillation: Secondary | ICD-10-CM | POA: Diagnosis not present

## 2017-09-23 DIAGNOSIS — I10 Essential (primary) hypertension: Secondary | ICD-10-CM | POA: Diagnosis not present

## 2017-09-30 DIAGNOSIS — R300 Dysuria: Secondary | ICD-10-CM | POA: Diagnosis not present

## 2017-09-30 DIAGNOSIS — N390 Urinary tract infection, site not specified: Secondary | ICD-10-CM | POA: Diagnosis not present

## 2017-10-05 DIAGNOSIS — B373 Candidiasis of vulva and vagina: Secondary | ICD-10-CM | POA: Diagnosis not present

## 2017-10-05 DIAGNOSIS — R300 Dysuria: Secondary | ICD-10-CM | POA: Diagnosis not present

## 2017-10-05 DIAGNOSIS — N390 Urinary tract infection, site not specified: Secondary | ICD-10-CM | POA: Diagnosis not present

## 2017-10-05 DIAGNOSIS — Z01419 Encounter for gynecological examination (general) (routine) without abnormal findings: Secondary | ICD-10-CM | POA: Diagnosis not present

## 2017-12-18 ENCOUNTER — Inpatient Hospital Stay (HOSPITAL_COMMUNITY)
Admission: EM | Admit: 2017-12-18 | Discharge: 2017-12-23 | DRG: 065 | Disposition: A | Discharge status: Rehabilitation Facility | Payer: Medicare Other | Attending: Neurosurgery | Admitting: Neurosurgery

## 2017-12-18 ENCOUNTER — Emergency Department (HOSPITAL_COMMUNITY): Payer: Medicare Other

## 2017-12-18 ENCOUNTER — Inpatient Hospital Stay (HOSPITAL_COMMUNITY): Payer: Medicare Other

## 2017-12-18 ENCOUNTER — Encounter (HOSPITAL_COMMUNITY): Payer: Self-pay | Admitting: Emergency Medicine

## 2017-12-18 ENCOUNTER — Other Ambulatory Visit: Payer: Self-pay

## 2017-12-18 DIAGNOSIS — R339 Retention of urine, unspecified: Secondary | ICD-10-CM | POA: Diagnosis not present

## 2017-12-18 DIAGNOSIS — Z298 Encounter for other specified prophylactic measures: Secondary | ICD-10-CM | POA: Diagnosis not present

## 2017-12-18 DIAGNOSIS — I2510 Atherosclerotic heart disease of native coronary artery without angina pectoris: Secondary | ICD-10-CM | POA: Diagnosis present

## 2017-12-18 DIAGNOSIS — E669 Obesity, unspecified: Secondary | ICD-10-CM | POA: Diagnosis present

## 2017-12-18 DIAGNOSIS — Z9861 Coronary angioplasty status: Secondary | ICD-10-CM

## 2017-12-18 DIAGNOSIS — R739 Hyperglycemia, unspecified: Secondary | ICD-10-CM | POA: Diagnosis not present

## 2017-12-18 DIAGNOSIS — Z515 Encounter for palliative care: Secondary | ICD-10-CM | POA: Diagnosis not present

## 2017-12-18 DIAGNOSIS — Z8349 Family history of other endocrine, nutritional and metabolic diseases: Secondary | ICD-10-CM

## 2017-12-18 DIAGNOSIS — D72828 Other elevated white blood cell count: Secondary | ICD-10-CM | POA: Diagnosis not present

## 2017-12-18 DIAGNOSIS — R29710 NIHSS score 10: Secondary | ICD-10-CM | POA: Diagnosis present

## 2017-12-18 DIAGNOSIS — G9341 Metabolic encephalopathy: Secondary | ICD-10-CM | POA: Diagnosis not present

## 2017-12-18 DIAGNOSIS — B9620 Unspecified Escherichia coli [E. coli] as the cause of diseases classified elsewhere: Secondary | ICD-10-CM | POA: Diagnosis present

## 2017-12-18 DIAGNOSIS — R5383 Other fatigue: Secondary | ICD-10-CM | POA: Diagnosis not present

## 2017-12-18 DIAGNOSIS — H5340 Unspecified visual field defects: Secondary | ICD-10-CM | POA: Diagnosis present

## 2017-12-18 DIAGNOSIS — D72829 Elevated white blood cell count, unspecified: Secondary | ICD-10-CM | POA: Diagnosis not present

## 2017-12-18 DIAGNOSIS — Z833 Family history of diabetes mellitus: Secondary | ICD-10-CM

## 2017-12-18 DIAGNOSIS — Z7901 Long term (current) use of anticoagulants: Secondary | ICD-10-CM

## 2017-12-18 DIAGNOSIS — Z6828 Body mass index (BMI) 28.0-28.9, adult: Secondary | ICD-10-CM

## 2017-12-18 DIAGNOSIS — Z803 Family history of malignant neoplasm of breast: Secondary | ICD-10-CM

## 2017-12-18 DIAGNOSIS — T380X5A Adverse effect of glucocorticoids and synthetic analogues, initial encounter: Secondary | ICD-10-CM | POA: Diagnosis not present

## 2017-12-18 DIAGNOSIS — Z885 Allergy status to narcotic agent status: Secondary | ICD-10-CM | POA: Diagnosis not present

## 2017-12-18 DIAGNOSIS — E440 Moderate protein-calorie malnutrition: Secondary | ICD-10-CM | POA: Diagnosis present

## 2017-12-18 DIAGNOSIS — Z881 Allergy status to other antibiotic agents status: Secondary | ICD-10-CM | POA: Diagnosis not present

## 2017-12-18 DIAGNOSIS — F419 Anxiety disorder, unspecified: Secondary | ICD-10-CM | POA: Diagnosis present

## 2017-12-18 DIAGNOSIS — I482 Chronic atrial fibrillation, unspecified: Secondary | ICD-10-CM | POA: Diagnosis present

## 2017-12-18 DIAGNOSIS — K2270 Barrett's esophagus without dysplasia: Secondary | ICD-10-CM | POA: Diagnosis present

## 2017-12-18 DIAGNOSIS — R52 Pain, unspecified: Secondary | ICD-10-CM | POA: Diagnosis not present

## 2017-12-18 DIAGNOSIS — I629 Nontraumatic intracranial hemorrhage, unspecified: Secondary | ICD-10-CM | POA: Diagnosis not present

## 2017-12-18 DIAGNOSIS — Z801 Family history of malignant neoplasm of trachea, bronchus and lung: Secondary | ICD-10-CM

## 2017-12-18 DIAGNOSIS — I081 Rheumatic disorders of both mitral and tricuspid valves: Secondary | ICD-10-CM | POA: Diagnosis present

## 2017-12-18 DIAGNOSIS — H538 Other visual disturbances: Secondary | ICD-10-CM | POA: Diagnosis present

## 2017-12-18 DIAGNOSIS — R209 Unspecified disturbances of skin sensation: Secondary | ICD-10-CM | POA: Diagnosis not present

## 2017-12-18 DIAGNOSIS — I10 Essential (primary) hypertension: Secondary | ICD-10-CM | POA: Diagnosis present

## 2017-12-18 DIAGNOSIS — I272 Pulmonary hypertension, unspecified: Secondary | ICD-10-CM | POA: Diagnosis present

## 2017-12-18 DIAGNOSIS — G936 Cerebral edema: Secondary | ICD-10-CM | POA: Diagnosis not present

## 2017-12-18 DIAGNOSIS — Z7189 Other specified counseling: Secondary | ICD-10-CM | POA: Diagnosis not present

## 2017-12-18 DIAGNOSIS — N179 Acute kidney failure, unspecified: Secondary | ICD-10-CM | POA: Diagnosis not present

## 2017-12-18 DIAGNOSIS — R0989 Other specified symptoms and signs involving the circulatory and respiratory systems: Secondary | ICD-10-CM | POA: Diagnosis not present

## 2017-12-18 DIAGNOSIS — Z888 Allergy status to other drugs, medicaments and biological substances status: Secondary | ICD-10-CM

## 2017-12-18 DIAGNOSIS — Z79899 Other long term (current) drug therapy: Secondary | ICD-10-CM

## 2017-12-18 DIAGNOSIS — I639 Cerebral infarction, unspecified: Secondary | ICD-10-CM

## 2017-12-18 DIAGNOSIS — Z955 Presence of coronary angioplasty implant and graft: Secondary | ICD-10-CM | POA: Diagnosis not present

## 2017-12-18 DIAGNOSIS — I169 Hypertensive crisis, unspecified: Secondary | ICD-10-CM | POA: Diagnosis not present

## 2017-12-18 DIAGNOSIS — Z90710 Acquired absence of both cervix and uterus: Secondary | ICD-10-CM | POA: Diagnosis not present

## 2017-12-18 DIAGNOSIS — N390 Urinary tract infection, site not specified: Secondary | ICD-10-CM | POA: Diagnosis present

## 2017-12-18 DIAGNOSIS — I6200 Nontraumatic subdural hemorrhage, unspecified: Secondary | ICD-10-CM | POA: Diagnosis not present

## 2017-12-18 DIAGNOSIS — K219 Gastro-esophageal reflux disease without esophagitis: Secondary | ICD-10-CM | POA: Diagnosis not present

## 2017-12-18 DIAGNOSIS — E46 Unspecified protein-calorie malnutrition: Secondary | ICD-10-CM | POA: Diagnosis not present

## 2017-12-18 DIAGNOSIS — M1990 Unspecified osteoarthritis, unspecified site: Secondary | ICD-10-CM | POA: Diagnosis present

## 2017-12-18 DIAGNOSIS — X58XXXA Exposure to other specified factors, initial encounter: Secondary | ICD-10-CM | POA: Diagnosis not present

## 2017-12-18 DIAGNOSIS — R51 Headache: Secondary | ICD-10-CM | POA: Diagnosis not present

## 2017-12-18 DIAGNOSIS — I69122 Dysarthria following nontraumatic intracerebral hemorrhage: Secondary | ICD-10-CM | POA: Diagnosis not present

## 2017-12-18 DIAGNOSIS — E876 Hypokalemia: Secondary | ICD-10-CM | POA: Diagnosis not present

## 2017-12-18 DIAGNOSIS — Z6830 Body mass index (BMI) 30.0-30.9, adult: Secondary | ICD-10-CM | POA: Diagnosis not present

## 2017-12-18 DIAGNOSIS — D62 Acute posthemorrhagic anemia: Secondary | ICD-10-CM | POA: Diagnosis present

## 2017-12-18 DIAGNOSIS — I69398 Other sequelae of cerebral infarction: Secondary | ICD-10-CM | POA: Diagnosis not present

## 2017-12-18 DIAGNOSIS — E785 Hyperlipidemia, unspecified: Secondary | ICD-10-CM | POA: Diagnosis present

## 2017-12-18 DIAGNOSIS — Z66 Do not resuscitate: Secondary | ICD-10-CM | POA: Diagnosis present

## 2017-12-18 DIAGNOSIS — Z8719 Personal history of other diseases of the digestive system: Secondary | ICD-10-CM | POA: Diagnosis not present

## 2017-12-18 DIAGNOSIS — I615 Nontraumatic intracerebral hemorrhage, intraventricular: Principal | ICD-10-CM | POA: Diagnosis present

## 2017-12-18 DIAGNOSIS — R509 Fever, unspecified: Secondary | ICD-10-CM | POA: Diagnosis not present

## 2017-12-18 DIAGNOSIS — Z23 Encounter for immunization: Secondary | ICD-10-CM | POA: Diagnosis present

## 2017-12-18 DIAGNOSIS — R0902 Hypoxemia: Secondary | ICD-10-CM | POA: Diagnosis not present

## 2017-12-18 DIAGNOSIS — I69112 Visuospatial deficit and spatial neglect following nontraumatic intracerebral hemorrhage: Secondary | ICD-10-CM | POA: Diagnosis present

## 2017-12-18 DIAGNOSIS — I69391 Dysphagia following cerebral infarction: Secondary | ICD-10-CM | POA: Diagnosis not present

## 2017-12-18 DIAGNOSIS — K210 Gastro-esophageal reflux disease with esophagitis: Secondary | ICD-10-CM | POA: Diagnosis not present

## 2017-12-18 DIAGNOSIS — Z87891 Personal history of nicotine dependence: Secondary | ICD-10-CM

## 2017-12-18 DIAGNOSIS — E871 Hypo-osmolality and hyponatremia: Secondary | ICD-10-CM | POA: Diagnosis not present

## 2017-12-18 DIAGNOSIS — F0390 Unspecified dementia without behavioral disturbance: Secondary | ICD-10-CM | POA: Diagnosis present

## 2017-12-18 DIAGNOSIS — Z8249 Family history of ischemic heart disease and other diseases of the circulatory system: Secondary | ICD-10-CM

## 2017-12-18 HISTORY — DX: Cerebral infarction, unspecified: I63.9

## 2017-12-18 LAB — MRSA PCR SCREENING: MRSA by PCR: NEGATIVE

## 2017-12-18 LAB — URINALYSIS, ROUTINE W REFLEX MICROSCOPIC
BILIRUBIN URINE: NEGATIVE
Glucose, UA: NEGATIVE mg/dL
Hgb urine dipstick: NEGATIVE
Ketones, ur: NEGATIVE mg/dL
Nitrite: NEGATIVE
PH: 7 (ref 5.0–8.0)
Protein, ur: NEGATIVE mg/dL
SPECIFIC GRAVITY, URINE: 1.005 (ref 1.005–1.030)

## 2017-12-18 LAB — CBC WITH DIFFERENTIAL/PLATELET
Abs Immature Granulocytes: 0.07 10*3/uL (ref 0.00–0.07)
Basophils Absolute: 0.1 10*3/uL (ref 0.0–0.1)
Basophils Relative: 1 %
EOS ABS: 0.2 10*3/uL (ref 0.0–0.5)
EOS PCT: 2 %
HCT: 41.7 % (ref 36.0–46.0)
HEMOGLOBIN: 13.6 g/dL (ref 12.0–15.0)
IMMATURE GRANULOCYTES: 1 %
LYMPHS ABS: 1.9 10*3/uL (ref 0.7–4.0)
LYMPHS PCT: 23 %
MCH: 29.4 pg (ref 26.0–34.0)
MCHC: 32.6 g/dL (ref 30.0–36.0)
MCV: 90.3 fL (ref 80.0–100.0)
Monocytes Absolute: 0.8 10*3/uL (ref 0.1–1.0)
Monocytes Relative: 9 %
NEUTROS PCT: 64 %
Neutro Abs: 5.5 10*3/uL (ref 1.7–7.7)
Platelets: 229 10*3/uL (ref 150–400)
RBC: 4.62 MIL/uL (ref 3.87–5.11)
RDW: 13.1 % (ref 11.5–15.5)
WBC: 8.5 10*3/uL (ref 4.0–10.5)
nRBC: 0 % (ref 0.0–0.2)

## 2017-12-18 LAB — CBC
HCT: 41.9 % (ref 36.0–46.0)
Hemoglobin: 14.1 g/dL (ref 12.0–15.0)
MCH: 29.9 pg (ref 26.0–34.0)
MCHC: 33.7 g/dL (ref 30.0–36.0)
MCV: 89 fL (ref 80.0–100.0)
Platelets: 213 10*3/uL (ref 150–400)
RBC: 4.71 MIL/uL (ref 3.87–5.11)
RDW: 12.7 % (ref 11.5–15.5)
WBC: 12.7 10*3/uL — ABNORMAL HIGH (ref 4.0–10.5)
nRBC: 0 % (ref 0.0–0.2)

## 2017-12-18 LAB — BASIC METABOLIC PANEL
ANION GAP: 7 (ref 5–15)
BUN: 14 mg/dL (ref 8–23)
CALCIUM: 9.6 mg/dL (ref 8.9–10.3)
CHLORIDE: 99 mmol/L (ref 98–111)
CO2: 28 mmol/L (ref 22–32)
CREATININE: 0.86 mg/dL (ref 0.44–1.00)
GFR calc non Af Amer: 58 mL/min — ABNORMAL LOW (ref >60)
Glucose, Bld: 107 mg/dL — ABNORMAL HIGH (ref 70–99)
POTASSIUM: 3.9 mmol/L (ref 3.5–5.1)
SODIUM: 134 mmol/L — AB (ref 135–145)

## 2017-12-18 LAB — PROTIME-INR
INR: 1.19
Prothrombin Time: 15 seconds (ref 11.4–15.2)

## 2017-12-18 LAB — APTT: aPTT: 37 s — ABNORMAL HIGH (ref 24–36)

## 2017-12-18 MED ORDER — SODIUM CHLORIDE 0.9 % IV SOLN
INTRAVENOUS | Status: DC
  Administered 2017-12-18 – 2017-12-23 (×10): via INTRAVENOUS

## 2017-12-18 MED ORDER — ACETAMINOPHEN 650 MG RE SUPP
650.0000 mg | RECTAL | Status: DC | PRN
  Administered 2017-12-19: 650 mg via RECTAL
  Filled 2017-12-18: qty 1

## 2017-12-18 MED ORDER — SODIUM CHLORIDE 0.9 % IV SOLN
1.0000 g | Freq: Once | INTRAVENOUS | Status: AC
  Administered 2017-12-18: 1 g via INTRAVENOUS
  Filled 2017-12-18: qty 10

## 2017-12-18 MED ORDER — LEVETIRACETAM IN NACL 500 MG/100ML IV SOLN
500.0000 mg | Freq: Two times a day (BID) | INTRAVENOUS | Status: DC
  Administered 2017-12-18 – 2017-12-23 (×9): 500 mg via INTRAVENOUS
  Filled 2017-12-18 (×10): qty 100

## 2017-12-18 MED ORDER — IOPAMIDOL (ISOVUE-370) INJECTION 76%
50.0000 mL | Freq: Once | INTRAVENOUS | Status: AC | PRN
  Administered 2017-12-18: 50 mL via INTRAVENOUS

## 2017-12-18 MED ORDER — CALCIUM CARBONATE 1250 (500 CA) MG PO TABS
1.0000 | ORAL_TABLET | Freq: Every day | ORAL | Status: DC
  Filled 2017-12-18 (×2): qty 1

## 2017-12-18 MED ORDER — MORPHINE SULFATE (PF) 2 MG/ML IV SOLN
2.0000 mg | Freq: Once | INTRAVENOUS | Status: AC
  Administered 2017-12-18: 2 mg via INTRAVENOUS
  Filled 2017-12-18: qty 1

## 2017-12-18 MED ORDER — CLEVIDIPINE BUTYRATE 0.5 MG/ML IV EMUL
0.0000 mg/h | INTRAVENOUS | Status: DC
  Administered 2017-12-18: 4 mg/h via INTRAVENOUS
  Administered 2017-12-19: 2 mg/h via INTRAVENOUS
  Administered 2017-12-20: 1 mg/h via INTRAVENOUS
  Administered 2017-12-20: 2 mg/h via INTRAVENOUS
  Administered 2017-12-20 – 2017-12-21 (×3): 6 mg/h via INTRAVENOUS
  Administered 2017-12-21: 5 mg/h via INTRAVENOUS
  Filled 2017-12-18 (×8): qty 50

## 2017-12-18 MED ORDER — VITAMIN D 1000 UNITS PO TABS
1000.0000 [IU] | ORAL_TABLET | Freq: Every day | ORAL | Status: DC
  Administered 2017-12-21 – 2017-12-23 (×3): 1000 [IU] via ORAL
  Filled 2017-12-18 (×3): qty 1

## 2017-12-18 MED ORDER — STROKE: EARLY STAGES OF RECOVERY BOOK
Freq: Once | Status: DC
  Filled 2017-12-18 (×2): qty 1

## 2017-12-18 MED ORDER — DOCUSATE SODIUM 100 MG PO CAPS
100.0000 mg | ORAL_CAPSULE | Freq: Two times a day (BID) | ORAL | Status: DC
  Administered 2017-12-21 – 2017-12-23 (×5): 100 mg via ORAL
  Filled 2017-12-18 (×5): qty 1

## 2017-12-18 MED ORDER — IRBESARTAN 75 MG PO TABS
75.0000 mg | ORAL_TABLET | Freq: Every day | ORAL | Status: DC
  Administered 2017-12-21 – 2017-12-23 (×3): 75 mg via ORAL
  Filled 2017-12-18 (×5): qty 1

## 2017-12-18 MED ORDER — OXYBUTYNIN CHLORIDE 5 MG PO TABS
5.0000 mg | ORAL_TABLET | Freq: Every day | ORAL | Status: DC
  Administered 2017-12-21 – 2017-12-23 (×3): 5 mg via ORAL
  Filled 2017-12-18 (×5): qty 1

## 2017-12-18 MED ORDER — SULFAMETHOXAZOLE-TRIMETHOPRIM 800-160 MG PO TABS: 1.0000 | ORAL_TABLET | Freq: Two times a day (BID) | ORAL | Status: DC

## 2017-12-18 MED ORDER — VITAMIN C 500 MG PO TABS
500.0000 mg | ORAL_TABLET | Freq: Every day | ORAL | Status: DC
  Administered 2017-12-21 – 2017-12-23 (×3): 500 mg via ORAL
  Filled 2017-12-18 (×3): qty 1

## 2017-12-18 MED ORDER — DIPHENHYDRAMINE HCL 50 MG/ML IJ SOLN
12.5000 mg | Freq: Once | INTRAMUSCULAR | Status: AC
  Administered 2017-12-18: 12.5 mg via INTRAVENOUS
  Filled 2017-12-18: qty 1

## 2017-12-18 MED ORDER — LABETALOL HCL 5 MG/ML IV SOLN
10.0000 mg | INTRAVENOUS | Status: DC | PRN
  Administered 2017-12-19 – 2017-12-20 (×6): 20 mg via INTRAVENOUS
  Filled 2017-12-18 (×2): qty 4
  Filled 2017-12-18: qty 8
  Filled 2017-12-18: qty 4
  Filled 2017-12-18: qty 8

## 2017-12-18 MED ORDER — EMPTY CONTAINERS FLEXIBLE MISC: 900.0000 mg | Freq: Once | Status: DC

## 2017-12-18 MED ORDER — ACETAMINOPHEN 325 MG PO TABS
650.0000 mg | ORAL_TABLET | ORAL | Status: DC | PRN
  Administered 2017-12-21 – 2017-12-22 (×4): 650 mg via ORAL
  Filled 2017-12-18 (×4): qty 2

## 2017-12-18 MED ORDER — CALCIUM CARBONATE 600 MG PO TABS: 600.0000 mg | ORAL_TABLET | Freq: Every day | ORAL | Status: DC

## 2017-12-18 MED ORDER — METOPROLOL TARTRATE 25 MG PO TABS
25.0000 mg | ORAL_TABLET | Freq: Two times a day (BID) | ORAL | Status: DC
  Administered 2017-12-21 – 2017-12-23 (×5): 25 mg via ORAL
  Filled 2017-12-18 (×5): qty 1

## 2017-12-18 MED ORDER — CARBOXYMETHYLCELLULOSE SODIUM 0.5 % OP SOLN: 1.0000 [drp] | OPHTHALMIC | Status: DC | PRN

## 2017-12-18 MED ORDER — PROTHROMBIN COMPLEX CONC HUMAN 500 UNITS IV KIT
3812.0000 [IU] | PACK | Status: AC
  Administered 2017-12-18: 3812 [IU] via INTRAVENOUS
  Filled 2017-12-18: qty 3312

## 2017-12-18 MED ORDER — LORAZEPAM 1 MG PO TABS: 0.5000 mg | ORAL_TABLET | Freq: Three times a day (TID) | ORAL | Status: DC | PRN

## 2017-12-18 MED ORDER — IOPAMIDOL (ISOVUE-370) INJECTION 76%
INTRAVENOUS | Status: AC
  Filled 2017-12-18: qty 50

## 2017-12-18 MED ORDER — POTASSIUM CHLORIDE CRYS ER 20 MEQ PO TBCR
20.0000 meq | EXTENDED_RELEASE_TABLET | Freq: Every day | ORAL | Status: DC
  Administered 2017-12-21 – 2017-12-23 (×3): 20 meq via ORAL
  Filled 2017-12-18 (×3): qty 1

## 2017-12-18 MED ORDER — MAGNESIUM CHLORIDE 64 MG PO TBEC
1.0000 | DELAYED_RELEASE_TABLET | Freq: Two times a day (BID) | ORAL | Status: DC
  Administered 2017-12-21 – 2017-12-23 (×5): 64 mg via ORAL
  Filled 2017-12-18 (×10): qty 1

## 2017-12-18 MED ORDER — ACETAMINOPHEN 160 MG/5ML PO SOLN: 650.0000 mg | ORAL | Status: DC | PRN

## 2017-12-18 MED ORDER — VITAMIN B-12 1000 MCG PO TABS
1000.0000 ug | ORAL_TABLET | Freq: Every day | ORAL | Status: DC
  Administered 2017-12-21 – 2017-12-23 (×3): 1000 ug via ORAL
  Filled 2017-12-18 (×5): qty 1

## 2017-12-18 MED ORDER — ACETAMINOPHEN 500 MG PO TABS: 500.0000 mg | ORAL_TABLET | Freq: Four times a day (QID) | ORAL | Status: DC | PRN

## 2017-12-18 MED ORDER — VITAMIN E 180 MG (400 UNIT) PO CAPS
400.0000 [IU] | ORAL_CAPSULE | Freq: Every day | ORAL | Status: DC
  Administered 2017-12-21 – 2017-12-23 (×3): 400 [IU] via ORAL
  Filled 2017-12-18 (×5): qty 1

## 2017-12-18 MED ORDER — NITROGLYCERIN 0.4 MG SL SUBL: 0.4000 mg | SUBLINGUAL_TABLET | SUBLINGUAL | Status: DC | PRN

## 2017-12-18 MED ORDER — PANTOPRAZOLE SODIUM 40 MG PO TBEC
40.0000 mg | DELAYED_RELEASE_TABLET | Freq: Every day | ORAL | Status: DC
  Administered 2017-12-21 – 2017-12-23 (×3): 40 mg via ORAL
  Filled 2017-12-18 (×3): qty 1

## 2017-12-18 NOTE — Progress Notes (Addendum)
  NEUROSURGERY PROGRESS NOTE   Came by to check on patient. She is resting comfortably with eyes closed. Answers questions with yes and no. Granddaughter at bedside asking family names and getting a response intermittently.  Follows commands with RUE.  No indication for EVD at this time. Continue to monitor closely.

## 2017-12-18 NOTE — ED Notes (Signed)
ED TO INPATIENT HANDOFF REPORT  Name/Age/Gender [name] 84 y.o. female  Code Status    Code Status Orders  (From admission, onward)         Start     Ordered   12/18/17 1757  Do not attempt resuscitation (DNR)  Continuous    Question Answer Comment  In the event of cardiac or respiratory ARREST Do not call a "code blue"   In the event of cardiac or respiratory ARREST Do not perform Intubation, CPR, defibrillation or ACLS   In the event of cardiac or respiratory ARREST Use medication by any route, position, wound care, and other measures to relive pain and suffering. May use oxygen, suction and manual treatment of airway obstruction as needed for comfort.      12/18/17 1800        Code Status History    Date Active Date Inactive Code Status Order ID Comments User Context   04/10/2015 2047 04/13/2015 1542 Full Code [id number]  Etta Quill, DO ED   10/12/2014 1806 10/13/2014 1632 Full Code [id number]  Samella Parr, NP Inpatient      Home/SNF/Other Home  Chief Complaint ha  Level of Care/Admitting Diagnosis ED Disposition    ED Disposition Condition [place]: [facility] [[id number]]  Level of Care: ICU [6]  Diagnosis: Intracranial hemorrhage [facility]) [[id number]]  Admitting Physician: Consuella Lose [[id number]]  Attending Physician: Consuella Lose [[id number]]  Estimated length of stay: past midnight tomorrow  Certification:: I certify this patient will need inpatient services for at least 2 midnights  Bed request comments: 4N  PT Class (Do Not Modify): Inpatient [101]  PT Acc Code (Do Not Modify): Private [1]       Medical History Past Medical History:  Diagnosis Date  . Anxiety   . Arthritis   . Barrett's esophagus   . Chronic atrial fibrillation    a. Pt previously declined DCCV.  Marland Kitchen Coronary artery disease    a. s/p Cypher DES to LAD in 2006;  b. Last LHC 02/2008:  pLAD 30%, mLAD stent patent, pOM 50%, EF 65%;   c. Lexiscan Myoview 7/14:  Intermediate risk, dist ant and inf-lat ischemia, EF 66% - patient declined cath and elected medical therapy.  Marland Kitchen GERD (gastroesophageal reflux disease)   . Hiatal hernia   . Hx of echocardiogram    Echocardiogram 08/17/12: EF 55-60%, MAC, mild MR, mild LAE, mild RVE, mild to moderate RAE, moderate TR, mildly increased pulmonary artery systolic pressures  . Hyperlipidemia   . Hypertension   . Mitral regurgitation    a. Mod by echo 10/2014.  . Obesity   . Pulmonary hypertension ([place])    a. Mod-severely elevated PA pressures by echo 10/2014.  Marland Kitchen QT prolongation    a. During 10/2014 admit, QTC 555m.  . Tricuspid regurgitation    a. Mod by echo 10/2014.  .Marland KitchenUTI (lower urinary tract infection) 03/2015    Allergies Allergies  Allergen Reactions  . Amlodipine Shortness Of Breath and Swelling    Edema in legs  . Methyldopa Shortness Of Breath and Swelling  . Actonel [Risedronate Sodium] Other (See Comments)    Caused a lump to come up in throat  . Ciprofloxacin Hcl     hallucinations  . Hctz [Hydrochlorothiazide] Itching    Pt reports causing "itching under the skin, with no rash noted."  . Hydralazine Hcl     Unknown, per pt   . Lisinopril  Cough  . Oxycodone Hcl     hallucinations  . Spironolactone     Itchiing  . Tape Itching  . Valsartan     Unknown, per pt    IV Location/Drains/Wounds Patient Lines/Drains/Airways Status   Active Line/Drains/Airways    Name:   Placement date:   Placement time:   Site:   Days:   Peripheral IV 12/18/17 Right Antecubital   12/18/17    1319    Antecubital   less than 1   Peripheral IV 12/18/17 Left Antecubital   12/18/17    1646    Antecubital   less than 1          Labs/Imaging Results for orders placed or performed during the hospital encounter of 12/18/17 (from the past 48 hour(s))  Basic metabolic panel     Status: Abnormal   Collection Time: 12/18/17  2:01 PM  Result Value Ref Range   Sodium 134 (L) 135 - 145  mmol/L   Potassium 3.9 3.5 - 5.1 mmol/L   Chloride 99 98 - 111 mmol/L   CO2 28 22 - 32 mmol/L   Glucose, Bld 107 (H) 70 - 99 mg/dL   BUN 14 8 - 23 mg/dL   Creatinine, Ser 0.86 0.44 - 1.00 mg/dL   Calcium 9.6 8.9 - 10.3 mg/dL   GFR calc non Af Amer 58 (L) >60 mL/min   GFR calc Af Amer >60 >60 mL/min    Comment: (NOTE) The eGFR has been calculated using the CKD EPI equation. This calculation has not been validated in all clinical situations. eGFR's persistently <60 mL/min signify possible Chronic Kidney Disease.    Anion gap 7 5 - 15    Comment: Performed at [facility] [address], [place], [place] [address]  CBC with Differential     Status: None   Collection Time: 12/18/17  2:01 PM  Result Value Ref Range   WBC 8.5 4.0 - 10.5 K/uL   RBC 4.62 3.87 - 5.11 MIL/uL   Hemoglobin 13.6 12.0 - 15.0 g/dL   HCT 41.7 36.0 - 46.0 %   MCV 90.3 80.0 - 100.0 fL   MCH 29.4 26.0 - 34.0 pg   MCHC 32.6 30.0 - 36.0 g/dL   RDW 13.1 11.5 - 15.5 %   Platelets 229 150 - 400 K/uL   nRBC 0.0 0.0 - 0.2 %   Neutrophils Relative % 64 %   Neutro Abs 5.5 1.7 - 7.7 K/uL   Lymphocytes Relative 23 %   Lymphs Abs 1.9 0.7 - 4.0 K/uL   Monocytes Relative 9 %   Monocytes Absolute 0.8 0.1 - 1.0 K/uL   Eosinophils Relative 2 %   Eosinophils Absolute 0.2 0.0 - 0.5 K/uL   Basophils Relative 1 %   Basophils Absolute 0.1 0.0 - 0.1 K/uL   Immature Granulocytes 1 %   Abs Immature Granulocytes 0.07 0.00 - 0.07 K/uL    Comment: Performed at [facility], [address] [place], [place] [address]  Urinalysis, Routine w reflex microscopic     Status: Abnormal   Collection Time: 12/18/17  2:25 PM  Result Value Ref Range   Color, Urine STRAW (A) YELLOW   APPearance CLEAR CLEAR   Specific Gravity, Urine 1.005 1.005 - 1.030   pH 7.0 5.0 - 8.0   Glucose, UA NEGATIVE NEGATIVE mg/dL   Hgb urine dipstick NEGATIVE NEGATIVE   Bilirubin Urine NEGATIVE NEGATIVE   Ketones, ur NEGATIVE NEGATIVE mg/dL  Protein, ur NEGATIVE NEGATIVE mg/dL   Nitrite NEGATIVE NEGATIVE   Leukocytes, UA TRACE (A) NEGATIVE   RBC / HPF 0-5 0 - 5 RBC/hpf   WBC, UA 6-10 0 - 5 WBC/hpf   Bacteria, UA MANY (A) NONE SEEN    Comment: Performed at [facility] [address] [place], [place] [address]   Ct Head Wo Contrast  Addendum Date: 12/18/2017   ADDENDUM REPORT: 12/18/2017 16:37 ADDENDUM: Critical Value/emergent results were called by myself by telephone at the time of interpretation on 12/18/2017 at 4:25 pm to Dr. Dene Gentry , who verbally acknowledged these results. Electronically Signed   By: Staci Righter M.D.   On: 12/18/2017 16:37   Result Date: 12/18/2017 CLINICAL DATA:  Headache since this morning. No fall. Blurred vision. Last seen well 11:50 a.m. On Xarelto. EXAM: CT HEAD WITHOUT CONTRAST TECHNIQUE: Contiguous axial images were obtained from the base of the skull through the vertex without intravenous contrast. COMPARISON:  07/29/2016. FINDINGS: Brain: Large intraventricular hemorrhage, filling the temporal horn of the RIGHT lateral ventricle, atrium, and extending into the body and frontal horn on the RIGHT. Some extension into third and fourth ventricles as well as trace layering fluid in the LEFT occipital horn. No subarachnoid blood is seen. No parenchymal hemorrhage component. There is RIGHT-to-LEFT displacement of the septum pellucidum, 8 mm, but no frank uncal herniation or brainstem compression. In computing the clot volume, I measured the upper and lower components of the RIGHT lateral ventricle separately. The upper component measures approximately 18 mL. The lower component measures approximately 30 mL. Insignificant volume of hemorrhage elsewhere. No visible acute stroke, mass lesion, or extra-axial hematoma. Generalized atrophy. Chronic microvascular ischemic change, with some possible superimposed transependymal CSF absorption. Vascular: Calcification of the cavernous internal carotid arteries  consistent with cerebrovascular atherosclerotic disease. No signs of intracranial large vessel occlusion. Skull: Calvarium intact. No evidence of skull fracture or focal lesion. Sinuses/Orbits: Unremarkable. Other: None. IMPRESSION: Large intraventricular hemorrhage filling most of the RIGHT lateral ventricle, with beginning extension outside of this primary location. No parenchymal or subarachnoid blood is evident. Anticoagulation related event may be the most likely etiology. Estimated clot volume approximately 50 mL. Developing unilateral RIGHT lateral ventricle hydrocephalus. 8 mm RIGHT-to-LEFT shift of septum pellucidum. Generalized atrophy with hypoattenuation of white matter as described. Neurosurgical consultation is warranted, with regard to the question of external ventricular drainage. Electronically Signed: By: Staci Righter M.D. On: 12/18/2017 16:34    Pending Labs Unresulted Labs (From admission, onward)    Start     Ordered   12/18/17 1757  CBC  Once,   R     12/18/17 1800   12/18/17 1757  Protime-INR  Once,   R     12/18/17 1800   12/18/17 1757  APTT  Once,   R     12/18/17 1800   12/18/17 1757  Urine rapid drug screen (hosp performed)  Once,   R     12/18/17 1800          Vitals/Pain Today's Vitals   12/18/17 1415 12/18/17 1615 12/18/17 1645 12/18/17 1801  BP: (!) 180/73 140/90 (!) 165/87 (!) 206/86  Pulse: 66     Resp: (!) _0 Temp:      TempSrc:      SpO2: 99%     Weight:      PainSc:        Isolation Precautions No active isolations  Medications Medications  stroke: mapping our early stages of recovery book (has no administration in time range)  0.9 %  sodium chloride infusion (has no administration in time range)  acetaminophen (TYLENOL) tablet 650 mg (has no administration in time range)    Or  acetaminophen (TYLENOL) solution 650 mg (has no administration in time range)    Or  acetaminophen (TYLENOL) suppository 650 mg (has no administration  in time range)  docusate sodium (COLACE) capsule 100 mg (has no administration in time range)  levETIRAcetam (KEPPRA) IVPB 500 mg/100 mL premix (has no administration in time range)  labetalol (NORMODYNE,TRANDATE) injection 10-40 mg (has no administration in time range)  clevidipine (CLEVIPREX) infusion 0.5 mg/mL (has no administration in time range)  morphine 2 MG/ML injection 2 mg (2 mg Intravenous Given 12/18/17 1404)  diphenhydrAMINE (BENADRYL) injection 12.5 mg (12.5 mg Intravenous Given 12/18/17 1404)  prothrombin complex conc human (KCENTRA) IVPB 3,812 Units (0 Units Intravenous Stopped 12/18/17 1814)  morphine 2 MG/ML injection 2 mg (2 mg Intravenous Given 12/18/17 1647)  cefTRIAXone (ROCEPHIN) 1 g in sodium chloride 0.9 % 100 mL IVPB (0 g Intravenous Stopped 12/18/17 1722)    Mobility walks with device

## 2017-12-18 NOTE — Consult Note (Addendum)
Chief Complaint   Chief Complaint  Patient presents with  . Headache    HPI   Consult requested by: Dr Francia Greaves Reason for consult: ICH  HPI: [name] is a 84 y.o. female who presented to ER via EMS for acute onset worst headache of life. Lives at home with daughter. Daughter and granddaughter at bedside assisting with history because at times, patient is confused. HA started abruptly around 11:50am. Right frontal/temporal headache. Associated with nausea without vomiting, photophobia and diplopia. No recent injury. Is on Xarelto for Afib.   ER has initiated [place]. Per pharmacy last dose >18 hours ago so andexxa not indicated. Hypertensive upon arrival 203/77. Given morphine and benadryl prior to my arrival. BP 140/90 at time of examination.   Patient Active Problem List   Diagnosis Date Noted  . Syncope 04/10/2015  . Sepsis secondary to UTI ([place]) 04/10/2015  . URI (upper respiratory infection) 04/10/2015  . UTI (lower urinary tract infection) 04/10/2015  . QT prolongation 10/12/2014  . Headache 09/02/2012  . Tricuspid valve regurgitation, moderate 08/18/2012  . Hypokalemia 08/16/2012  . Hyponatremia 08/16/2012  . Atrial fibrillation ([place])   . Hypertension   . Anxiety   . Mixed hyperlipidemia 09/11/2009  . GERD 08/30/2008  . CHEST PAIN 08/30/2008  . Coronary atherosclerosis 04/04/2008  . Syncope and collapse 04/04/2008    PMH: Past Medical History:  Diagnosis Date  . Anxiety   . Arthritis   . Barrett's esophagus   . Chronic atrial fibrillation    a. Pt previously declined DCCV.  Marland Kitchen Coronary artery disease    a. s/p Cypher DES to LAD in 2006;  b. Last LHC 02/2008:  pLAD 30%, mLAD stent patent, pOM 50%, EF 65%;  c. Lexiscan Myoview 7/14:  Intermediate risk, dist ant and inf-lat ischemia, EF 66% - patient declined cath and elected medical therapy.  Marland Kitchen GERD (gastroesophageal reflux disease)   . Hiatal hernia   . Hx of echocardiogram    Echocardiogram 08/17/12:  EF 55-60%, MAC, mild MR, mild LAE, mild RVE, mild to moderate RAE, moderate TR, mildly increased pulmonary artery systolic pressures  . Hyperlipidemia   . Hypertension   . Mitral regurgitation    a. Mod by echo 10/2014.  . Obesity   . Pulmonary hypertension ([place])    a. Mod-severely elevated PA pressures by echo 10/2014.  Marland Kitchen QT prolongation    a. During 10/2014 admit, QTC 538m.  . Tricuspid regurgitation    a. Mod by echo 10/2014.  .Marland KitchenUTI (lower urinary tract infection) 03/2015    PSH: Past Surgical History:  Procedure Laterality Date  . ABDOMINAL HYSTERECTOMY    . ANKLE SURGERY    . BREAST EXCISIONAL BIOPSY Right 1970  . BREAST EXCISIONAL BIOPSY Right   . BREAST EXCISIONAL BIOPSY Left   . CESAREAN SECTION    . CORONARY ANGIOPLASTY    . ESOPHAGOGASTRODUODENOSCOPY       (Not in a hospital admission)  SH: Social History   Tobacco Use  . Smoking status: Former Smoker    Types: Cigarettes  . Smokeless tobacco: Never Used  Substance Use Topics  . Alcohol use: No    Alcohol/week: 0.0 standard drinks  . Drug use: No    MEDS: Prior to Admission medications   Medication Sig Start Date End Date Taking? Authorizing Provider  acetaminophen (TYLENOL) 500 MG tablet Take 500 mg by mouth every 6 (six) hours as needed for mild pain.   Yes [provider]  calcium carbonate (OS-CAL) 600 MG TABS Take 600 mg by mouth daily with breakfast.    Yes [provider]  carboxymethylcellulose (REFRESH PLUS) 0.5 % SOLN Place 1 drop into both eyes as needed (dry eyes).   Yes [provider]  Chlorphen-Phenyleph-ASA (ALKA-SELTZER PLUS COLD PO) Take 1 tablet by mouth daily as needed (cold symptoms).   Yes [provider]  Cholecalciferol (VITAMIN D-3) 1000 UNITS CAPS Take 1 capsule by mouth daily.   Yes [provider]  LORazepam (ATIVAN) 0.5 MG tablet Take 1 tablet (0.5 mg total) by mouth every 8 (eight) hours as needed for anxiety. 10/12/14  Yes Josue Hector, MD  magnesium chloride (SLOW-MAG) 64 MG TBEC SR tablet Take 1 tablet (64 mg total) by mouth 2 (two) times daily. 10/13/14  Yes Short, Noah Delaine, MD  metoprolol tartrate (LOPRESSOR) 25 MG tablet TAKE ONE TABLET BY MOUTH TWICE A DAY 06/29/17  Yes Josue Hector, MD  nitroGLYCERIN (NITROSTAT) 0.4 MG SL tablet Place 1 tablet (0.4 mg total) under the tongue every 5 (five) minutes as needed for chest pain (3 doses only). 10/12/14  Yes Josue Hector, MD  omeprazole (PRILOSEC) 20 MG capsule Take 20 mg by mouth daily.   Yes [provider]  oxybutynin (DITROPAN) 5 MG tablet Take 5 mg by mouth daily.   Yes [provider]  potassium chloride SA (K-DUR,KLOR-CON) 20 MEQ tablet Take 1 tablet by mouth daily. 11/10/16  Yes [provider]  Rivaroxaban (XARELTO) 15 MG TABS tablet Take 1 tablet (15 mg total) by mouth daily. 09/28/12  Yes Josue Hector, MD  telmisartan (MICARDIS) 20 MG tablet Take 1 tablet by mouth daily. 01/29/17  Yes [provider]  vitamin B-12 (CYANOCOBALAMIN) 1000 MCG tablet Take 1,000 mcg by mouth daily.   Yes [provider]  vitamin C (ASCORBIC ACID) 500 MG tablet Take 500 mg by mouth daily.   Yes [provider]  vitamin E 400 UNIT capsule Take 400 Units by mouth daily.   Yes [provider]    ALLERGY: Allergies  Allergen Reactions  . Amlodipine Shortness Of Breath and Swelling    Edema in legs  . Methyldopa Shortness Of Breath and Swelling  . Actonel [Risedronate Sodium] Other (See Comments)    Caused a lump to come up in throat  . Ciprofloxacin Hcl     hallucinations  . Hctz [Hydrochlorothiazide] Itching    Pt reports causing "itching under the skin, with no rash noted."  . Hydralazine Hcl     Unknown, per pt   . Lisinopril Cough  . Oxycodone Hcl     hallucinations  . Spironolactone     Itchiing  . Tape Itching  . Valsartan     Unknown, per pt    Social History   Tobacco Use  . Smoking  status: Former Smoker    Types: Cigarettes  . Smokeless tobacco: Never Used  Substance Use Topics  . Alcohol use: No    Alcohol/week: 0.0 standard drinks     Family History  Problem Relation Age of Onset  . Diabetes Mother   . Heart attack Mother   . Lung cancer Father   . Thyroid disease Sister   . Thyroid disease Sister   . Hypertension Sister   . Hypertension Sister   . Hypertension Sister   . Hypertension Sister   . Hypertension Brother   . Hypertension Brother   . Breast cancer Other 45  . Colon  cancer Neg Hx   . Colon polyps Neg Hx   . Esophageal cancer Neg Hx   . Stroke Neg Hx      ROS   Review of Systems  Eyes: Positive for blurred vision, double vision and photophobia.  Gastrointestinal: Positive for nausea. Negative for vomiting.  Genitourinary: Positive for dysuria, frequency and urgency.  Musculoskeletal: Negative for back pain, falls, joint pain, myalgias and neck pain.  Neurological: Positive for headaches. Negative for dizziness, tingling, tremors, sensory change, speech change, focal weakness, seizures, loss of consciousness and weakness.     Exam   Vitals:   12/18/17 1415 12/18/17 1615  BP: (!) 180/73 140/90  Pulse: 66   Resp: (!) 21 12  Temp:    SpO2: 99%    General appearance: elderly female, resting comfortably with cold rag over eyes.  Eyes: No scleral injection Cardiovascular: Regular rate  Pulmonary: Effort normal, non-labored breathing Musculoskeletal:     Muscle tone upper extremities: Normal    Muscle tone lower extremities: Normal    Motor exam: Upper Extremities Deltoid Bicep Tricep Grip  Right 5/5 5/5 5/5 5/5  Left 4+/5 4+/5 4+/5 4+/5   Lower Extremity IP Quad PF DF EHL  Right 5/5 5/5 5/5 5/5 5/5  Left 4+/5 4+/5 4+/5 4+/5 4+/5   Neurological Mental Status:    - Patient is awake, oriented to self    - Patient is unable to give a clear and coherent history.    - Mild dysarthria  Cranial Nerves: mild left facial droop.  Difficult to perform thorough exam as patient has difficulties following two step commands Cerebellar    - FNF: abnormal   Results - Imaging/Labs   Results for orders placed or performed during the hospital encounter of 12/18/17 (from the past 48 hour(s))  Basic metabolic panel     Status: Abnormal   Collection Time: 12/18/17  2:01 PM  Result Value Ref Range   Sodium 134 (L) 135 - 145 mmol/L   Potassium 3.9 3.5 - 5.1 mmol/L   Chloride 99 98 - 111 mmol/L   CO2 28 22 - 32 mmol/L   Glucose, Bld 107 (H) 70 - 99 mg/dL   BUN 14 8 - 23 mg/dL   Creatinine, Ser 0.86 0.44 - 1.00 mg/dL   Calcium 9.6 8.9 - 10.3 mg/dL   GFR calc non Af Amer 58 (L) >60 mL/min   GFR calc Af Amer >60 >60 mL/min    Comment: (NOTE) The eGFR has been calculated using the CKD EPI equation. This calculation has not been validated in all clinical situations. eGFR's persistently <60 mL/min signify possible Chronic Kidney Disease.    Anion gap 7 5 - 15    Comment: Performed at [facility] [address], [place], [place] [address]  CBC with Differential     Status: None   Collection Time: 12/18/17  2:01 PM  Result Value Ref Range   WBC 8.5 4.0 - 10.5 K/uL   RBC 4.62 3.87 - 5.11 MIL/uL   Hemoglobin 13.6 12.0 - 15.0 g/dL   HCT 41.7 36.0 - 46.0 %   MCV 90.3 80.0 - 100.0 fL   MCH 29.4 26.0 - 34.0 pg   MCHC 32.6 30.0 - 36.0 g/dL   RDW 13.1 11.5 - 15.5 %   Platelets 229 150 - 400 K/uL   nRBC 0.0 0.0 - 0.2 %   Neutrophils Relative % 64 %   Neutro Abs 5.5 1.7 - 7.7 K/uL  Lymphocytes Relative 23 %   Lymphs Abs 1.9 0.7 - 4.0 K/uL   Monocytes Relative 9 %   Monocytes Absolute 0.8 0.1 - 1.0 K/uL   Eosinophils Relative 2 %   Eosinophils Absolute 0.2 0.0 - 0.5 K/uL   Basophils Relative 1 %   Basophils Absolute 0.1 0.0 - 0.1 K/uL   Immature Granulocytes 1 %   Abs Immature Granulocytes 0.07 0.00 - 0.07 K/uL    Comment: Performed at [facility] [address] [place], [place] [address]  Urinalysis,  Routine w reflex microscopic     Status: Abnormal   Collection Time: 12/18/17  2:25 PM  Result Value Ref Range   Color, Urine STRAW (A) YELLOW   APPearance CLEAR CLEAR   Specific Gravity, Urine 1.005 1.005 - 1.030   pH 7.0 5.0 - 8.0   Glucose, UA NEGATIVE NEGATIVE mg/dL   Hgb urine dipstick NEGATIVE NEGATIVE   Bilirubin Urine NEGATIVE NEGATIVE   Ketones, ur NEGATIVE NEGATIVE mg/dL   Protein, ur NEGATIVE NEGATIVE mg/dL   Nitrite NEGATIVE NEGATIVE   Leukocytes, UA TRACE (A) NEGATIVE   RBC / HPF 0-5 0 - 5 RBC/hpf   WBC, UA 6-10 0 - 5 WBC/hpf   Bacteria, UA MANY (A) NONE SEEN    Comment: Performed at [facility] [address], [place], [place] [address]    Ct Head Wo Contrast  Addendum Date: 12/18/2017   ADDENDUM REPORT: 12/18/2017 16:37 ADDENDUM: Critical Value/emergent results were called by myself by telephone at the time of interpretation on 12/18/2017 at 4:25 pm to Dr. Dene Gentry , who verbally acknowledged these results. Electronically Signed   By: Staci Righter M.D.   On: 12/18/2017 16:37   Result Date: 12/18/2017 CLINICAL DATA:  Headache since this morning. No fall. Blurred vision. Last seen well 11:50 a.m. On Xarelto. EXAM: CT HEAD WITHOUT CONTRAST TECHNIQUE: Contiguous axial images were obtained from the base of the skull through the vertex without intravenous contrast. COMPARISON:  07/29/2016. FINDINGS: Brain: Large intraventricular hemorrhage, filling the temporal horn of the RIGHT lateral ventricle, atrium, and extending into the body and frontal horn on the RIGHT. Some extension into third and fourth ventricles as well as trace layering fluid in the LEFT occipital horn. No subarachnoid blood is seen. No parenchymal hemorrhage component. There is RIGHT-to-LEFT displacement of the septum pellucidum, 8 mm, but no frank uncal herniation or brainstem compression. In computing the clot volume, I measured the upper and lower components of the RIGHT lateral ventricle separately.  The upper component measures approximately 18 mL. The lower component measures approximately 30 mL. Insignificant volume of hemorrhage elsewhere. No visible acute stroke, mass lesion, or extra-axial hematoma. Generalized atrophy. Chronic microvascular ischemic change, with some possible superimposed transependymal CSF absorption. Vascular: Calcification of the cavernous internal carotid arteries consistent with cerebrovascular atherosclerotic disease. No signs of intracranial large vessel occlusion. Skull: Calvarium intact. No evidence of skull fracture or focal lesion. Sinuses/Orbits: Unremarkable. Other: None. IMPRESSION: Large intraventricular hemorrhage filling most of the RIGHT lateral ventricle, with beginning extension outside of this primary location. No parenchymal or subarachnoid blood is evident. Anticoagulation related event may be the most likely etiology. Estimated clot volume approximately 50 mL. Developing unilateral RIGHT lateral ventricle hydrocephalus. 8 mm RIGHT-to-LEFT shift of septum pellucidum. Generalized atrophy with hypoattenuation of white matter as described. Neurosurgical consultation is warranted, with regard to the question of external ventricular drainage. Electronically Signed: By: Staci Righter M.D. On: 12/18/2017 16:34  Impression/Plan   84 y.o. female with acute onset HA. CT head reveals large intraventricular hemorrhage. Without any injury, suspect spontaneous bleed due to Xarelto use. She has mild left sided weakness and difficulties with following multi-step commands but was just given morphine and benadryl prior to evaluation.  Patient is DNR per daughter and granddaughter but should she need intubation, they are okay with that.  Intraventricular hemorrhage - Despite large amount of IVH, patient is doing fair. She is able to communicate and follow simple commands although is confused.  Because she is essentially neurologically okay, will hold on any acute NS  intervention. - there is a high likelihood that patient will develop symptomatic hydrocephalus. A discussion was had regarding possible outcomes. Patient will either (1) remain stable and no neurosurgical intervention will be needed or (2) decline and develop hydrocephalus requiring EVD and possible intubation. Family states should she develop symptomatic hydrocephalus, they wish to pursue EVD placement and intubation as necessary. While at bedside, the risks, benefits and alternatives to EVD placement was discussed with the patients daughter and granddaughter. They state understanding and wish for EVD to be placed should it become necessary.  - Admit to neuro ICU for monitoring. - q 1 hour neuro checks. Report any change - CTA to rule out vascular abnormality - SBP goal <140  Chronic anti-coag use due to Afib - Xarelto has been d/c - Per pharmacy, last dose Xarelto >18hours so no indication for andexxa. Kcentra initiated.   UTI - UA performed in ER also concerning for UTI. Urine sent for culture - Started on Bactrim DS BID x5days  Ferne Reus, PA-C [facility]

## 2017-12-18 NOTE — ED Notes (Signed)
Pt back from CT

## 2017-12-18 NOTE — ED Provider Notes (Signed)
[facility]ER[facility]Y DEPARTMENT Provider Note   CSN: [id number] Arrival date & time: 12/18/17  1315     History   Chief Complaint Chief Complaint  Patient presents with  . Headache    HPI [name] is a 84 y.o. female.  84 year old female with prior medical history as detailed below presents for evaluation of right-sided headache.  Patient reports that the headache started earlier this morning.  She does not routinely get headaches.  She took Tylenol at home without improvement in her symptoms.  Patient denies associated weakness.  She reports that the lights in her room or making headache worse.  She denies fever.  Upon my evaluation she is alert and appears moderately uncomfortable.  She denies associated chest pain or shortness of breath.  She denies neck pain or neck stiffness.  Patient also complains of increased and more frequent urination.  She is concerned that she may have UTI.  She takes Xarelto.  Her last dose was yesterday evening.  The history is provided by the patient and medical records.  Headache   This is a new problem. The current episode started 3 to 5 hours ago. Episode frequency: Rarely. The problem has not changed since onset.The headache is associated with bright light. The pain is located in the right unilateral region. The quality of the pain is described as sharp. The pain is moderate. The pain does not radiate. Associated symptoms include nausea. She has tried acetaminophen for the symptoms.    Past Medical History:  Diagnosis Date  . Anxiety   . Arthritis   . Barrett's esophagus   . Chronic atrial fibrillation    a. Pt previously declined DCCV.  Marland Kitchen Coronary artery disease    a. s/p Cypher DES to LAD in 2006;  b. Last LHC 02/2008:  pLAD 30%, mLAD stent patent, pOM 50%, EF 65%;  c. Lexiscan Myoview 7/14:  Intermediate risk, dist ant and inf-lat ischemia, EF 66% - patient declined cath and elected medical therapy.  Marland Kitchen GERD  (gastroesophageal reflux disease)   . Hiatal hernia   . Hx of echocardiogram    Echocardiogram 08/17/12: EF 55-60%, MAC, mild MR, mild LAE, mild RVE, mild to moderate RAE, moderate TR, mildly increased pulmonary artery systolic pressures  . Hyperlipidemia   . Hypertension   . Mitral regurgitation    a. Mod by echo 10/2014.  . Obesity   . Pulmonary hypertension ([place])    a. Mod-severely elevated PA pressures by echo 10/2014.  Marland Kitchen QT prolongation    a. During 10/2014 admit, QTC 536m.  . Tricuspid regurgitation    a. Mod by echo 10/2014.  .Marland KitchenUTI (lower urinary tract infection) 03/2015    Patient Active Problem List   Diagnosis Date Noted  . Intracranial hemorrhage ([place] 12/18/2017  . Syncope 04/10/2015  . Sepsis secondary to UTI ([place] 04/10/2015  . URI (upper respiratory infection) 04/10/2015  . UTI (lower urinary tract infection) 04/10/2015  . QT prolongation 10/12/2014  . Headache 09/02/2012  . Tricuspid valve regurgitation, moderate 08/18/2012  . Hypokalemia 08/16/2012  . Hyponatremia 08/16/2012  . Atrial fibrillation ([place]   . Hypertension   . Anxiety   . Mixed hyperlipidemia 09/11/2009  . GERD 08/30/2008  . CHEST PAIN 08/30/2008  . Coronary atherosclerosis 04/04/2008  . Syncope and collapse 04/04/2008    Past Surgical History:  Procedure Laterality Date  . ABDOMINAL HYSTERECTOMY    . ANKLE SURGERY    . BREAST EXCISIONAL BIOPSY Right 1970  .  BREAST EXCISIONAL BIOPSY Right   . BREAST EXCISIONAL BIOPSY Left   . CESAREAN SECTION    . CORONARY ANGIOPLASTY    . ESOPHAGOGASTRODUODENOSCOPY       OB History   None      Home Medications    Prior to Admission medications   Medication Sig Start Date End Date Taking? Authorizing Provider  acetaminophen (TYLENOL) 500 MG tablet Take 500 mg by mouth every 6 (six) hours as needed for mild pain.   Yes [provider]  calcium carbonate (OS-CAL) 600 MG TABS Take 600 mg by mouth daily with breakfast.    Yes [provider]  carboxymethylcellulose (REFRESH PLUS) 0.5 % SOLN Place 1 drop into both eyes as needed (dry eyes).   Yes [provider]  Chlorphen-Phenyleph-ASA (ALKA-SELTZER PLUS COLD PO) Take 1 tablet by mouth daily as needed (cold symptoms).   Yes [provider]  Cholecalciferol (VITAMIN D-3) 1000 UNITS CAPS Take 1 capsule by mouth daily.   Yes [provider]  LORazepam (ATIVAN) 0.5 MG tablet Take 1 tablet (0.5 mg total) by mouth every 8 (eight) hours as needed for anxiety. 10/12/14  Yes Josue Hector, MD  magnesium chloride (SLOW-MAG) 64 MG TBEC SR tablet Take 1 tablet (64 mg total) by mouth 2 (two) times daily. 10/13/14  Yes Short, Noah Delaine, MD  metoprolol tartrate (LOPRESSOR) 25 MG tablet TAKE ONE TABLET BY MOUTH TWICE A DAY 06/29/17  Yes Josue Hector, MD  nitroGLYCERIN (NITROSTAT) 0.4 MG SL tablet Place 1 tablet (0.4 mg total) under the tongue every 5 (five) minutes as needed for chest pain (3 doses only). 10/12/14  Yes Josue Hector, MD  omeprazole (PRILOSEC) 20 MG capsule Take 20 mg by mouth daily.   Yes [provider]  oxybutynin (DITROPAN) 5 MG tablet Take 5 mg by mouth daily.   Yes [provider]  potassium chloride SA (K-DUR,KLOR-CON) 20 MEQ tablet Take 1 tablet by mouth daily. 11/10/16  Yes [provider]  Rivaroxaban (XARELTO) 15 MG TABS tablet Take 1 tablet (15 mg total) by mouth daily. 09/28/12  Yes Josue Hector, MD  telmisartan (MICARDIS) 20 MG tablet Take 1 tablet by mouth daily. 01/29/17  Yes [provider]  vitamin B-12 (CYANOCOBALAMIN) 1000 MCG tablet Take 1,000 mcg by mouth daily.   Yes [provider]  vitamin C (ASCORBIC ACID) 500 MG tablet Take 500 mg by mouth daily.   Yes [provider]  vitamin E 400 UNIT capsule Take 400 Units by mouth daily.   Yes [provider]    Family History Family History  Problem Relation Age of Onset  . Diabetes Mother   . Heart attack  Mother   . Lung cancer Father   . Thyroid disease Sister   . Thyroid disease Sister   . Hypertension Sister   . Hypertension Sister   . Hypertension Sister   . Hypertension Sister   . Hypertension Brother   . Hypertension Brother   . Breast cancer Other 45  . Colon cancer Neg Hx   . Colon polyps Neg Hx   . Esophageal cancer Neg Hx   . Stroke Neg Hx     Social History Social History   Tobacco Use  . Smoking status: Former Smoker    Types: Cigarettes  . Smokeless tobacco: Never Used  Substance Use Topics  . Alcohol use: No    Alcohol/week: 0.0 standard drinks  . Drug use: No  Allergies   Amlodipine; Methyldopa; Actonel [risedronate sodium]; Ciprofloxacin hcl; Hctz [hydrochlorothiazide]; Hydralazine hcl; Lisinopril; Oxycodone hcl; Spironolactone; Tape; and Valsartan   Review of Systems Review of Systems  Gastrointestinal: Positive for nausea.  Neurological: Positive for headaches.  All other systems reviewed and are negative.    Physical Exam Updated Vital Signs BP (!) 165/87   Pulse 66   Temp (!) 97 F (36.1 C) (Rectal)   Resp 14   Wt 76.4 kg   SpO2 99%   BMI 28.03 kg/m   Physical Exam  Constitutional: She is oriented to person, place, and time. She appears well-developed and well-nourished. No distress.  HENT:  Head: Normocephalic and atraumatic.  Mouth/Throat: Oropharynx is clear and moist.  Eyes: Pupils are equal, round, and reactive to light. Conjunctivae and EOM are normal.  Neck: Normal range of motion. Neck supple.  Cardiovascular: Normal rate, regular rhythm and normal heart sounds.  Pulmonary/Chest: Effort normal and breath sounds normal. No respiratory distress.  Abdominal: Soft. She exhibits no distension. There is no tenderness.  Musculoskeletal: Normal range of motion. She exhibits no edema or deformity.  Neurological: She is alert and oriented to person, place, and time. She has normal strength. She is not disoriented. No cranial nerve  deficit or sensory deficit. Coordination normal. GCS eye subscore is 4. GCS verbal subscore is 5. GCS motor subscore is 6.  Skin: Skin is warm and dry.  Psychiatric: She has a normal mood and affect.  Nursing note and vitals reviewed.    ED Treatments / Results  Labs (all labs ordered are listed, but only abnormal results are displayed) Labs Reviewed  URINALYSIS, ROUTINE W REFLEX MICROSCOPIC - Abnormal; Notable for the following components:      Result Value   Color, Urine STRAW (*)    Leukocytes, UA TRACE (*)    Bacteria, UA MANY (*)    All other components within normal limits  BASIC METABOLIC PANEL - Abnormal; Notable for the following components:   Sodium 134 (*)    Glucose, Bld 107 (*)    GFR calc non Af Amer 58 (*)    All other components within normal limits  CBC WITH DIFFERENTIAL/PLATELET  CBC  PROTIME-INR  APTT  RAPID URINE DRUG SCREEN, HOSP PERFORMED    EKG None  Radiology Ct Head Wo Contrast  Addendum Date: 12/18/2017   ADDENDUM REPORT: 12/18/2017 16:37 ADDENDUM: Critical Value/emergent results were called by myself by telephone at the time of interpretation on 12/18/2017 at 4:25 pm to Dr. Dene Gentry , who verbally acknowledged these results. Electronically Signed   By: Staci Righter M.D.   On: 12/18/2017 16:37   Result Date: 12/18/2017 CLINICAL DATA:  Headache since this morning. No fall. Blurred vision. Last seen well 11:50 a.m. On Xarelto. EXAM: CT HEAD WITHOUT CONTRAST TECHNIQUE: Contiguous axial images were obtained from the base of the skull through the vertex without intravenous contrast. COMPARISON:  07/29/2016. FINDINGS: Brain: Large intraventricular hemorrhage, filling the temporal horn of the RIGHT lateral ventricle, atrium, and extending into the body and frontal horn on the RIGHT. Some extension into third and fourth ventricles as well as trace layering fluid in the LEFT occipital horn. No subarachnoid blood is seen. No parenchymal hemorrhage component.  There is RIGHT-to-LEFT displacement of the septum pellucidum, 8 mm, but no frank uncal herniation or brainstem compression. In computing the clot volume, I measured the upper and lower components of the RIGHT lateral ventricle separately. The upper component measures approximately 18 mL. The  lower component measures approximately 30 mL. Insignificant volume of hemorrhage elsewhere. No visible acute stroke, mass lesion, or extra-axial hematoma. Generalized atrophy. Chronic microvascular ischemic change, with some possible superimposed transependymal CSF absorption. Vascular: Calcification of the cavernous internal carotid arteries consistent with cerebrovascular atherosclerotic disease. No signs of intracranial large vessel occlusion. Skull: Calvarium intact. No evidence of skull fracture or focal lesion. Sinuses/Orbits: Unremarkable. Other: None. IMPRESSION: Large intraventricular hemorrhage filling most of the RIGHT lateral ventricle, with beginning extension outside of this primary location. No parenchymal or subarachnoid blood is evident. Anticoagulation related event may be the most likely etiology. Estimated clot volume approximately 50 mL. Developing unilateral RIGHT lateral ventricle hydrocephalus. 8 mm RIGHT-to-LEFT shift of septum pellucidum. Generalized atrophy with hypoattenuation of white matter as described. Neurosurgical consultation is warranted, with regard to the question of external ventricular drainage. Electronically Signed: By: Staci Righter M.D. On: 12/18/2017 16:34    Procedures Procedures (including critical care time) CRITICAL CARE Performed by: Valarie Merino   Total critical care time: 45 minutes  Critical care time was exclusive of separately billable procedures and treating other patients.  Critical care was necessary to treat or prevent imminent or life-threatening deterioration.  Critical care was time spent personally by me on the following activities: development of  treatment plan with patient and/or surrogate as well as nursing, discussions with consultants, evaluation of patient's response to treatment, examination of patient, obtaining history from patient or surrogate, ordering and performing treatments and interventions, ordering and review of laboratory studies, ordering and review of radiographic studies, pulse oximetry and re-evaluation of patient's condition.   Medications Ordered in ED Medications   stroke: mapping our early stages of recovery book (has no administration in time range)  0.9 %  sodium chloride infusion (has no administration in time range)  acetaminophen (TYLENOL) tablet 650 mg (has no administration in time range)    Or  acetaminophen (TYLENOL) solution 650 mg (has no administration in time range)    Or  acetaminophen (TYLENOL) suppository 650 mg (has no administration in time range)  docusate sodium (COLACE) capsule 100 mg (has no administration in time range)  levETIRAcetam (KEPPRA) IVPB 500 mg/100 mL premix (has no administration in time range)  labetalol (NORMODYNE,TRANDATE) injection 10-40 mg (has no administration in time range)  clevidipine (CLEVIPREX) infusion 0.5 mg/mL (has no administration in time range)  morphine 2 MG/ML injection 2 mg (2 mg Intravenous Given 12/18/17 1404)  diphenhydrAMINE (BENADRYL) injection 12.5 mg (12.5 mg Intravenous Given 12/18/17 1404)  prothrombin complex conc human (KCENTRA) IVPB 3,812 Units (3,812 Units Intravenous New Bag/Given 12/18/17 1655)  morphine 2 MG/ML injection 2 mg (2 mg Intravenous Given 12/18/17 1647)  cefTRIAXone (ROCEPHIN) 1 g in sodium chloride 0.9 % 100 mL IVPB (0 g Intravenous Stopped 12/18/17 1722)     Initial Impression / Assessment and Plan / ED Course  I have reviewed the triage vital signs and the nursing notes.  Pertinent labs & imaging results that were available during my care of the patient were reviewed by me and considered in my medical decision making (see  chart for details).     MDM  Screen complete  Patient is presenting primarily for evaluation of headache.  CT imaging demonstrates intraventricular hemorrhage.  Patient is on Xarelto (last dose last night).  Kcentra given for reversal.  Initial BP's were elevated.  These appear improved with narcotics for pain control. Neurosurgery aware of hypertension at time of consult.   Of note, patient also with  evidence of UTI and given Rocephin for initial treatment of same.  Neurosurgery is aware of case and will admit.   Final Clinical Impressions(s) / ED Diagnoses   Final diagnoses:  Intraventricular hemorrhage ([place])  Urinary tract infection without hematuria, site unspecified    ED Discharge Orders    None       Valarie Merino, MD 12/18/17 1811

## 2017-12-18 NOTE — H&P (Signed)
Please see Consult note 11/01 at 5:10pm for H&P

## 2017-12-18 NOTE — ED Triage Notes (Signed)
Pt brought in by [facility] from home. Pt complains of headache since this morning. Pt states she has not fallen. EMS reports she was negative on their stroke screen. Pt only complains of blurry vision. LKW was 11:50 am. Pt does take Xarelto.  Pt has hx of afib. EMS reports pts bP has been consistently high.  HR 50s- 90s, RR 22, 100%RA, BP 196/84, CBG 134

## 2017-12-18 NOTE — ED Notes (Signed)
Placed patient on a purwick

## 2017-12-19 ENCOUNTER — Encounter (HOSPITAL_COMMUNITY): Payer: Self-pay | Admitting: Certified Registered Nurse Anesthetist

## 2017-12-19 LAB — RAPID URINE DRUG SCREEN, HOSP PERFORMED
Amphetamines: NOT DETECTED
Barbiturates: NOT DETECTED
Benzodiazepines: NOT DETECTED
Cocaine: NOT DETECTED
Opiates: NOT DETECTED
Tetrahydrocannabinol: NOT DETECTED

## 2017-12-19 MED ORDER — SODIUM CHLORIDE 0.9 % IV SOLN
1.0000 g | INTRAVENOUS | Status: DC
  Administered 2017-12-19 – 2017-12-23 (×5): 1 g via INTRAVENOUS
  Filled 2017-12-19 (×5): qty 10

## 2017-12-19 NOTE — Progress Notes (Signed)
PT Cancellation Note  Patient Details Name: TANAY MASSIAH MRN: [id number] DOB: 04/02/1926   Cancelled Treatment:    Reason Eval/Treat Not Completed: Active bedrest order. Will await increased activity orders prior to initiating PT eval.   Thelma Comp 12/19/2017, 11:32 AM   Rolinda Roan, PT, DPT Acute Rehabilitation Services Pager: [phone] Office: [phone]

## 2017-12-19 NOTE — Plan of Care (Signed)
Pt tolerating female external catheter, able to keep in place

## 2017-12-19 NOTE — Progress Notes (Signed)
OT Cancellation Note  Patient Details Name: [name] MRN: [id number] DOB: 08/29/1926   Cancelled Treatment:    Reason Eval/Treat Not Completed: Active bedrest order.  Will reattempt.  Lucille Passy, OTR/L Acute Rehabilitation Services Pager [phone] Office [phone]   Lucille Passy M 12/19/2017, 12:26 PM

## 2017-12-19 NOTE — Progress Notes (Signed)
Patient passed [facility] screen. [place] with Neurosurgery notified. Patient's diet advanced to clear liquid diet.

## 2017-12-19 NOTE — Progress Notes (Signed)
  NEUROSURGERY PROGRESS NOTE   Pt seen and examined. No issues overnight.   EXAM: Temp:  [97 F (36.1 C)-98.1 F (36.7 C)] 97.9 F (36.6 C) (11/02 0800) Pulse Rate:  [43-120] 54 (11/02 1030) Resp:  [8-25] 19 (11/02 1030) BP: (98-206)/(45-139) 133/57 (11/02 1030) SpO2:  [96 %-100 %] 99 % (11/02 1030) Weight:  [76.4 kg] 76.4 kg (11/01 1323) Intake/Output      11/01 0701 - 11/02 0700 11/02 0701 - 11/03 0700   I.V. (mL/kg) 997.7 (13.1) 295.1 (3.9)   IV Piggyback 420    Total Intake(mL/kg) 1417.7 (18.6) 295.1 (3.9)   Urine (mL/kg/hr) 600 200 (0.7)   Total Output 600 200   Net +817.7 +95.1        Urine Occurrence 1 x     Drowsy but easily arouses Eyes open spontaneously Answers simple questions MAE well, minimal LUE weakness   LABS: Lab Results  Component Value Date   CREATININE 0.86 12/18/2017   BUN 14 12/18/2017   NA 134 (L) 12/18/2017   K 3.9 12/18/2017   CL 99 12/18/2017   CO2 28 12/18/2017   Lab Results  Component Value Date   WBC 12.7 (H) 12/18/2017   HGB 14.1 12/18/2017   HCT 41.9 12/18/2017   MCV 89.0 12/18/2017   PLT 213 12/18/2017    IMAGING: CTA reviewed, stable size of right IVH. No aneurysm/AVM seen.  IMPRESSION: - 84 y.o. female s/p spontaneous ICH related to Xarelto for afib. Remains neurologically stable without significant clinical signs of HCP.  PLAN: - Will cont to monitor neurologic exam for signs of HCP - Have discussed with family, plan if there is clinical decline to proceed with ET intubation and placement of EVD.

## 2017-12-20 MED ORDER — CALCIUM CARBONATE 1250 (500 CA) MG PO TABS
1.0000 | ORAL_TABLET | Freq: Every day | ORAL | Status: DC
  Administered 2017-12-21 – 2017-12-23 (×3): 500 mg via ORAL
  Filled 2017-12-20 (×4): qty 1

## 2017-12-20 NOTE — Evaluation (Signed)
Occupational Therapy Evaluation Patient Details Name: [name] MRN: [id number] DOB: September 28, 1926 Today's Date: 12/20/2017    History of Present Illness 84 y.o. female on Xarelto for afib presenting with sudden onset HA and confusion, in setting of SAH. pmh Afib, CAD, HTn   Clinical Impression   Upon arrival, pt awake and supine in bed. No family present to provide home information and PLOF; pt poor historian due to cognitive deficits. Pt requiring Max A for UB ADLs, Max-Total A for LB ADLs, and Max A +2 for functional transfers. Pt presenting with poor proprioception, decreased balance, functional use of LUE, left neglect, vision deficits, poor cognition, and increased pushing to left side. Pt will require further acute OT to increase safety and independencies with ADLs and functional mobility. Recommend dc to CIR for intensive OT to optimize safety and independence with ADLs, reduce caregiver burden, and facilitate return to PLOF.     Follow Up Recommendations  CIR;Supervision/Assistance - 24 hour    Equipment Recommendations  Other (comment)(Defer to next venue)    Recommendations for Other Services PT consult;Speech consult;Rehab consult     Precautions / Restrictions Precautions Precautions: Fall      Mobility Bed Mobility Overal bed mobility: Needs Assistance Bed Mobility: Rolling;Supine to Sit Rolling: Max assist;+2 for physical assistance   Supine to sit: Max assist;+2 for physical assistance     General bed mobility comments: max assisst to intiate movement and elevate trunk to upright at EOB  Transfers Overall transfer level: Needs assistance Equipment used: 2 person hand held assist(face to face ) Transfers: Sit to/from [facility] Sit to Stand: Max assist;+2 physical assistance Stand pivot transfers: Max assist;+2 physical assistance       General transfer comment: Max to total assist for transfers due to patients poor spatial awareness  and proprioception. Patient also with heavy inadvertant resistance in opposition to the left (pushers like). (unable to reach upright)    Balance Overall balance assessment: Needs assistance Sitting-balance support: Feet supported Sitting balance-Leahy Scale: Poor Sitting balance - Comments: heavy lateral lean to the left, Pushers like presentation) Postural control: Left lateral lean   Standing balance-Leahy Scale: Zero                             ADL either performed or assessed with clinical judgement   ADL Overall ADL's : Needs assistance/impaired Eating/Feeding: Moderate assistance;Sitting   Grooming: Maximal assistance;Sitting Grooming Details (indicate cue type and reason): Presenting with poor coorindation and proprioception during hadn hygiene. Requiring Max A to bring hands towards at midline and then rub hands together. When asking pt to bring her hands together, pt reaching her right hand to her left elbow and rubbing. Upper Body Bathing: Maximal assistance;Sitting   Lower Body Bathing: Maximal assistance;+2 for physical assistance;Sit to/from stand   Upper Body Dressing : Maximal assistance;Sitting   Lower Body Dressing: Total assistance;Sit to/from stand   Toilet Transfer: Maximal assistance;+2 for physical assistance;Stand-pivot;BSC   Toileting- Clothing Manipulation and Hygiene: Maximal assistance;+2 for physical assistance Toileting - Clothing Manipulation Details (indicate cue type and reason): Max A +2 to stand, maintain standing, and perform peri care with second person. Pt able to perform peri care while sitting in recliner with back of chair reclined back. Pt perseverating on cleanign peri care.      Functional mobility during ADLs: Maximal assistance;+2 for physical assistance(stand pivot only) General ADL Comments: Pt requiring Max A +2 for ADLs  and functional mobility due to poor strength, balance, cognition, and significant pushing left.       Vision Baseline Vision/History: Wears glasses Wears Glasses: At all times Patient Visual Report: Other (comment)(Unable to discribe vision deficits due to vision) Vision Assessment?: Yes Diplopia Assessment: Other (comment)(squinting her eyes (R>L).) Depth Perception: Overshoots(Right) Additional Comments: Pt stating "I have to wear my glasses if I want to see, but everything is blurry." Difficult to assess vision due to cognitive deficits. Pt with left neglect (space and body) and over reaching towards the right side. Pt squinting her eyes (right eye more than left).     Perception     Praxis      Pertinent Vitals/Pain Pain Assessment: Faces Faces Pain Scale: Hurts little more Pain Location: head Pain Descriptors / Indicators: Headache Pain Intervention(s): Monitored during session;Limited activity within patient's tolerance;Repositioned     Hand Dominance Right   Extremity/Trunk Assessment Upper Extremity Assessment Upper Extremity Assessment: LUE deficits/detail LUE Deficits / Details: Pt with left neglect to space and self. Increased tone with hand in fisted position. Pt with involuntary movements of LUE.  LUE Coordination: decreased fine motor;decreased gross motor   Lower Extremity Assessment Lower Extremity Assessment: Defer to PT evaluation   Cervical / Trunk Assessment Cervical / Trunk Assessment: Other exceptions Cervical / Trunk Exceptions: pushing to left   Communication Communication Communication: HOH   Cognition Arousal/Alertness: Awake/alert Behavior During Therapy: Restless;Impulsive Overall Cognitive Status: Impaired/Different from baseline Area of Impairment: Orientation;Attention;Memory;Following commands;Safety/judgement;Awareness;Problem solving                 Orientation Level: Person Current Attention Level: Sustained Memory: Decreased short-term memory;Decreased recall of precautions Following Commands: Follows one step commands  inconsistently;Follows one step commands with increased time Safety/Judgement: Decreased awareness of safety;Decreased awareness of deficits Awareness: Intellectual Problem Solving: Slow processing;Decreased initiation;Difficulty sequencing;Requires verbal cues;Requires tactile cues General Comments: Pt reporting that she is at home with her poodle. Poor following of cues and requiring Max tactile, verbal, and visual cues for ADLs and safety. Pt with poor awareness of deficits and safety satating "I could have walked to the bathroom" when she required Max A +2 for sit<>stand transfer at [place] Comments  VSS    Exercises     Shoulder Instructions      Home Living Family/patient expects to be discharged to:: Private residence Living Arrangements: Children                               Additional Comments: no family present at this time, patient non reliable historian      Prior Functioning/Environment          Comments: Pt unable to provide information and no family present        OT Problem List: Decreased strength;Decreased range of motion;Decreased activity tolerance;Impaired balance (sitting and/or standing);Impaired vision/perception;Decreased coordination;Decreased cognition;Decreased safety awareness;Decreased knowledge of use of DME or AE;Decreased knowledge of precautions;Impaired tone;Impaired UE functional use;Pain      OT Treatment/Interventions: Self-care/ADL training;Therapeutic exercise;Energy conservation;DME and/or AE instruction;Therapeutic activities;Patient/family education    OT Goals(Current goals can be found in the care plan section) Acute Rehab OT Goals Patient Stated Goal: none stated OT Goal Formulation: With patient Time For Goal Achievement: 01/03/18 Potential to Achieve Goals: Good  OT Frequency: Min 2X/week   Barriers to D/C:            Co-evaluation PT/OT/SLP Co-Evaluation/Treatment: Yes Reason for Co-Treatment:  Complexity of the patient's impairments (multi-system involvement);For patient/therapist safety;To address functional/ADL transfers   OT goals addressed during session: ADL's and self-care      AM-PAC PT "6 Clicks" Daily Activity     Outcome Measure Help from another person eating meals?: A Lot Help from another person taking care of personal grooming?: A Lot Help from another person toileting, which includes using toliet, bedpan, or urinal?: A Lot Help from another person bathing (including washing, rinsing, drying)?: A Lot Help from another person to put on and taking off regular upper body clothing?: A Lot Help from another person to put on and taking off regular lower body clothing?: Total 6 Click Score: 11   End of Session Equipment Utilized During Treatment: Gait belt Nurse Communication: Mobility status  Activity Tolerance: Treatment limited secondary to agitation Patient left: in chair;with call bell/phone within reach;with chair alarm set;with restraints reapplied  OT Visit Diagnosis: Unsteadiness on feet (R26.81);Other abnormalities of gait and mobility (R26.89);Muscle weakness (generalized) (M62.81);Pain;Low vision, both eyes (H54.2);Ataxia, unspecified (R27.0);Hemiplegia and hemiparesis Hemiplegia - Right/Left: Left Hemiplegia - caused by: Cerebral infarction Pain - part of body: (HA)                Time: 7579-7282 OT Time Calculation (min): 22 min Charges:  OT General Charges $OT Visit: 1 Visit OT Evaluation $OT Eval High Complexity: 1 High  Tyquarius Paglia MSOT, OTR/L Acute Rehab Pager: [phone] Office: [place] 12/20/2017, 1:14 PM

## 2017-12-20 NOTE — Plan of Care (Signed)
  Problem: Clinical Measurements: Goal: Respiratory complications will improve Outcome: Progressing   Problem: Activity: Goal: Risk for activity intolerance will decrease Outcome: Progressing Note:  Patient OOB to chair with PT today.    Problem: Nutrition: Goal: Adequate nutrition will be maintained Outcome: Not Progressing Note:  Waiting on SLP eval, patient coughing with clear liquids.

## 2017-12-20 NOTE — Evaluation (Signed)
Clinical/Bedside Swallow Evaluation Patient Details  Name: [name] MRN: [id number] Date of Birth: 07-Aug-1926  Today's Date: 12/20/2017 Time: SLP Start Time (ACUTE ONLY): 5643 SLP Stop Time (ACUTE ONLY): 1357 SLP Time Calculation (min) (ACUTE ONLY): 16 min  Past Medical History:  Past Medical History:  Diagnosis Date  . Anxiety   . Arthritis   . Barrett's esophagus   . Chronic atrial fibrillation    a. Pt previously declined DCCV.  Marland Kitchen Coronary artery disease    a. s/p Cypher DES to LAD in 2006;  b. Last LHC 02/2008:  pLAD 30%, mLAD stent patent, pOM 50%, EF 65%;  c. Lexiscan Myoview 7/14:  Intermediate risk, dist ant and inf-lat ischemia, EF 66% - patient declined cath and elected medical therapy.  Marland Kitchen GERD (gastroesophageal reflux disease)   . Hiatal hernia   . Hx of echocardiogram    Echocardiogram 08/17/12: EF 55-60%, MAC, mild MR, mild LAE, mild RVE, mild to moderate RAE, moderate TR, mildly increased pulmonary artery systolic pressures  . Hyperlipidemia   . Hypertension   . Mitral regurgitation    a. Mod by echo 10/2014.  . Obesity   . Pulmonary hypertension ([place])    a. Mod-severely elevated PA pressures by echo 10/2014.  Marland Kitchen QT prolongation    a. During 10/2014 admit, QTC 522m.  . Tricuspid regurgitation    a. Mod by echo 10/2014.  .Marland KitchenUTI (lower urinary tract infection) 03/2015   Past Surgical History:  Past Surgical History:  Procedure Laterality Date  . ABDOMINAL HYSTERECTOMY    . ANKLE SURGERY    . BREAST EXCISIONAL BIOPSY Right 1970  . BREAST EXCISIONAL BIOPSY Right   . BREAST EXCISIONAL BIOPSY Left   . CESAREAN SECTION    . CORONARY ANGIOPLASTY    . ESOPHAGOGASTRODUODENOSCOPY     HPI:  84y.o. female on Xarelto for afib presenting with sudden onset HA and confusion, in setting of spontaneous right IVH. pmh Afib, CAD, HTN. Passes [facility] swallow screen however presenting with s/s of aspiration per RN. Swallow evaluation ordered.    Assessment / Plan /  Recommendation Clinical Impression  Limited evaluation due to patient's level of alertness which according to RN flucutates frequently. Patient required max verbal and tactile cueing to acheive even semi-alert state with eyes remaining closed throughout evaluation despite patient intermittently responding to clinician. Max cues provided for oral awareness of bolus with eventual oral acceptance followed by prolonged oral phase with periods of oral holding. Eventual swallow initiated with one episode of coughing post intake of large bolus thin liquid via straw. Given that patient initially passed the [facility] swallow screen upon admission, suspect that swallow is functional when patient fully alert although aspiration remains possible, particularly with large boluses. Recommend continuation of current diet, allowing to eat only when fully alert, holding pos with any s/s of aspiration. SLP will f/u in am 11/4.  SLP Visit Diagnosis: Dysphagia, oropharyngeal phase (R13.12)    Aspiration Risk  Moderate aspiration risk    Diet Recommendation Thin liquid   Medication Administration: Crushed with puree Supervision: Staff to assist with self feeding;Full supervision/cueing for compensatory strategies Compensations: Slow rate;Small sips/bites Postural Changes: Seated upright at 90 degrees    Other  Recommendations Oral Care Recommendations: Oral care BID   Follow up Recommendations (TBD)      Frequency and Duration min 2x/week  2 weeks       Prognosis Prognosis for Safe Diet Advancement: Good Barriers to Reach Goals: Cognitive deficits  Swallow Study   General HPI: 84 y.o. female on Xarelto for afib presenting with sudden onset HA and confusion, in setting of spontaneous right IVH. pmh Afib, CAD, HTN. Passes YALE swallow screen however presenting with s/s of aspiration per RN. Swallow evaluation ordered.  Type of Study: Bedside Swallow Evaluation Previous Swallow Assessment: EGD in  2010-possible Barretts esophagus distally Diet Prior to this Study: Thin liquids Temperature Spikes Noted: No Respiratory Status: Room air History of Recent Intubation: No Behavior/Cognition: Lethargic/Drowsy;Requires cueing;Doesn't follow directions Oral Cavity Assessment: Within Functional Limits Oral Care Completed by SLP: Recent completion by staff Oral Cavity - Dentition: Edentulous Vision: (eyes closed) Self-Feeding Abilities: Total assist Patient Positioning: Upright in chair Baseline Vocal Quality: Normal Volitional Cough: Cognitively unable to elicit Volitional Swallow: Unable to elicit    Oral/Motor/Sensory Function Overall Oral Motor/Sensory Function: Other (comment)(unable to particpate in exam, no obvious focal deficits)   Ice Chips Ice chips: Impaired Presentation: Spoon Oral Phase Impairments: Impaired mastication;Poor awareness of bolus Oral Phase Functional Implications: Prolonged oral transit   Thin Liquid Thin Liquid: Impaired Presentation: Straw Oral Phase Impairments: Reduced labial seal;Poor awareness of bolus Oral Phase Functional Implications: Prolonged oral transit;Oral holding Pharyngeal  Phase Impairments: Suspected delayed Swallow;Cough - Immediate    Nectar Thick Nectar Thick Liquid: Not tested   Honey Thick Honey Thick Liquid: Not tested   Puree Puree: Impaired Presentation: Spoon Oral Phase Impairments: Impaired mastication;Poor awareness of bolus Oral Phase Functional Implications: Prolonged oral transit;Oral holding   Solid     Solid: Not tested     Sonya Rainwater MA, CCC-SLP   [name] Meryl 12/20/2017,2:04 PM

## 2017-12-20 NOTE — Progress Notes (Signed)
Patient with agitation throughout the night removing BP cuff repeatedly. BP taken by nurse when patient was calm.

## 2017-12-20 NOTE — Evaluation (Signed)
Physical Therapy Evaluation Patient Details Name: [name] MRN: [id number] DOB: 06-06-1926 Today's Date: 12/20/2017   History of Present Illness  84 y.o. female on Xarelto for afib presenting with sudden onset HA and confusion, in setting of SAH. pmh Afib, CAD, HTn  Clinical Impression  Orders received for PT evaluation. Patient demonstrates deficits in functional mobility as indicated below. Will benefit from continued skilled PT to address deficits and maximize function. Will see as indicated and progress as tolerated.  At this time, patient with significant deficits in cognition and overall function. Feel patient will need comprehensive therapies. Recommending CIR consult at this time.    Follow Up Recommendations CIR    Equipment Recommendations  None recommended by PT    Recommendations for Other Services Rehab consult     Precautions / Restrictions Precautions Precautions: Fall      Mobility  Bed Mobility Overal bed mobility: Needs Assistance Bed Mobility: Rolling;Supine to Sit Rolling: Max assist;+2 for physical assistance   Supine to sit: Max assist;+2 for physical assistance     General bed mobility comments: max assisst to intiate movement and elevate trunk to upright at EOB  Transfers Overall transfer level: Needs assistance Equipment used: 2 person hand held assist(face to face ) Transfers: Sit to/from [facility] Sit to Stand: Max assist;+2 physical assistance Stand pivot transfers: Max assist;+2 physical assistance       General transfer comment: Max to total assist for transfers due to patients poor spatial awareness and proprioception. Patient also with heavy inadvertant resistance in opposition to the left (pushers like). (unable to reach upright)  Ambulation/Gait             General Gait Details: NT  Stairs            Wheelchair Mobility    Modified Rankin (Stroke Patients Only)       Balance Overall  balance assessment: Needs assistance Sitting-balance support: Feet supported Sitting balance-Leahy Scale: Poor Sitting balance - Comments: heavy lateral lean to the left, Pushers like presentation)     Standing balance-Leahy Scale: Zero                               Pertinent Vitals/Pain Pain Assessment: Faces Faces Pain Scale: Hurts little more Pain Location: head Pain Descriptors / Indicators: Headache Pain Intervention(s): Monitored during session    Home Living Family/patient expects to be discharged to:: Private residence Living Arrangements: Children               Additional Comments: no family present at this time, patient non reliable historian    Prior Function           Comments: unclear     Hand Dominance   Dominant Hand: Right    Extremity/Trunk Assessment   Upper Extremity Assessment Upper Extremity Assessment: LUE deficits/detail LUE Deficits / Details: Pt with left neglect to space and self. Increased tone LUE Coordination: decreased fine motor;decreased gross motor    Lower Extremity Assessment Lower Extremity Assessment: Defer to PT evaluation LLE Deficits / Details: noted stiffness and tone in LLE, limited overall assessment due to patient cognition LLE Sensation: decreased proprioception LLE Coordination: decreased fine motor;decreased gross motor    Cervical / Trunk Assessment Cervical / Trunk Assessment: Other exceptions Cervical / Trunk Exceptions: pushing to left  Communication      Cognition Arousal/Alertness: Awake/alert Behavior During Therapy: Restless;Impulsive Overall Cognitive Status: Impaired/Different from baseline  Area of Impairment: Orientation;Attention;Memory;Following commands;Safety/judgement;Awareness;Problem solving                 Orientation Level: Person Current Attention Level: Sustained Memory: Decreased short-term memory;Decreased recall of precautions Following Commands: Follows one  step commands inconsistently;Follows one step commands with increased time Safety/Judgement: Decreased awareness of safety;Decreased awareness of deficits Awareness: Intellectual Problem Solving: Slow processing;Decreased initiation;Difficulty sequencing;Requires verbal cues;Requires tactile cues General Comments: patient with some perseveration with behaviors and attention      General Comments      Exercises     Assessment/Plan    PT Assessment Patient needs continued PT services  PT Problem List Decreased strength;Decreased activity tolerance;Decreased balance;Decreased mobility;Decreased coordination;Decreased cognition;Decreased knowledge of use of DME;Decreased safety awareness;Impaired sensation       PT Treatment Interventions DME instruction;Gait training;Stair training;Functional mobility training;Therapeutic activities;Therapeutic exercise;Balance training;Neuromuscular re-education;Cognitive remediation;Patient/family education    PT Goals (Current goals can be found in the Care Plan section)  Acute Rehab PT Goals Patient Stated Goal: none stated PT Goal Formulation: Patient unable to participate in goal setting Time For Goal Achievement: 01/03/18 Potential to Achieve Goals: Fair    Frequency Min 4X/week   Barriers to discharge        Co-evaluation               AM-PAC PT "6 Clicks" Daily Activity  Outcome Measure Difficulty turning over in bed (including adjusting bedclothes, sheets and blankets)?: Unable Difficulty moving from lying on back to sitting on the side of the bed? : Unable Difficulty sitting down on and standing up from a chair with arms (e.g., wheelchair, bedside commode, etc,.)?: Unable Help needed moving to and from a bed to chair (including a wheelchair)?: A Lot Help needed walking in hospital room?: Total Help needed climbing 3-5 steps with a railing? : Total 6 Click Score: 7    End of Session   Activity Tolerance: Patient tolerated  treatment well Patient left: in chair;with call bell/phone within reach;with chair alarm set Nurse Communication: Mobility status PT Visit Diagnosis: Other symptoms and signs involving the nervous system (R29.898)    Time: 0355-9741 PT Time Calculation (min) (ACUTE ONLY): 22 min   Charges:   PT Evaluation $PT Eval Moderate Complexity: [place], PT DPT  Board Certified Neurologic Specialist Acute Rehabilitation Services Pager [phone] Office [place] 12/20/2017, 11:34 AM

## 2017-12-20 NOTE — Progress Notes (Signed)
  NEUROSURGERY PROGRESS NOTE   No issues overnight.   EXAM:  BP 106/86   Pulse 70   Temp (!) 97.5 F (36.4 C) (Oral)   Resp 16   Wt 76.4 kg   SpO2 99%   BMI 28.03 kg/m   Drowsy but easily arouses Speech fluent CN grossly intact  Mild LUE/LLE weakness and neglect  IMPRESSION:  84 y.o. female s/p spontaneous right IVH, has remained neurologically stable, no signs of HCP.  PLAN: - Begin to mobilize with PT/OT - SLP eval - SBP < 168mmHg for now, may transition to PO meds after SLP eval

## 2017-12-20 NOTE — Progress Notes (Signed)
OT Cancellation Note  Patient Details Name: ROSENE PILLING MRN: [id number] DOB: 19-Jul-1926   Cancelled Treatment:    Reason Eval/Treat Not Completed: Active bedrest order. Will return as schedule allows and await increase in activity orders prior to initiating OT evaluation. Thank you.  [place], OTR/L Acute Rehab Pager: [phone] Office: [phone] 12/20/2017, 7:09 AM

## 2017-12-21 DIAGNOSIS — D72829 Elevated white blood cell count, unspecified: Secondary | ICD-10-CM

## 2017-12-21 DIAGNOSIS — I482 Chronic atrial fibrillation, unspecified: Secondary | ICD-10-CM

## 2017-12-21 DIAGNOSIS — N390 Urinary tract infection, site not specified: Secondary | ICD-10-CM

## 2017-12-21 DIAGNOSIS — I272 Pulmonary hypertension, unspecified: Secondary | ICD-10-CM

## 2017-12-21 DIAGNOSIS — I629 Nontraumatic intracranial hemorrhage, unspecified: Secondary | ICD-10-CM

## 2017-12-21 DIAGNOSIS — K210 Gastro-esophageal reflux disease with esophagitis: Secondary | ICD-10-CM

## 2017-12-21 DIAGNOSIS — I615 Nontraumatic intracerebral hemorrhage, intraventricular: Principal | ICD-10-CM

## 2017-12-21 DIAGNOSIS — I2510 Atherosclerotic heart disease of native coronary artery without angina pectoris: Secondary | ICD-10-CM

## 2017-12-21 LAB — URINE CULTURE: Culture: >=100000 — AB

## 2017-12-21 LAB — TRIGLYCERIDES: TRIGLYCERIDES: 212 mg/dL — AB (ref <150)

## 2017-12-21 MED ORDER — LABETALOL HCL 5 MG/ML IV SOLN
10.0000 mg | INTRAVENOUS | Status: DC | PRN
  Administered 2017-12-21: 20 mg via INTRAVENOUS
  Administered 2017-12-22: 40 mg via INTRAVENOUS
  Administered 2017-12-22 – 2017-12-23 (×7): 20 mg via INTRAVENOUS
  Administered 2017-12-23: 40 mg via INTRAVENOUS
  Filled 2017-12-21 (×2): qty 4
  Filled 2017-12-21 (×2): qty 8
  Filled 2017-12-21: qty 4
  Filled 2017-12-21: qty 8
  Filled 2017-12-21: qty 4
  Filled 2017-12-21: qty 8

## 2017-12-21 NOTE — Progress Notes (Signed)
  NEUROSURGERY PROGRESS NOTE   No issues overnight.  Complains of mild HA  EXAM:  BP (!) 134/59   Pulse 97   Temp 99.5 F (37.5 C) (Axillary)   Resp 15   Wt 76.4 kg   SpO2 97%   BMI 28.03 kg/m   Drowsy but awakens to voice Follows commands with encouragemnt MAEW, R>L  IMPRESSION/PLAN 84 y.o. female s/p spontaneous right IVH. She is neurologically stable and without signs of hydrocephalus. She is much more awake throughout the night per nursing. - Working with SLP, OT and PT. Appreciate assistance. - Still on cleviprex. Will need to transition to po medications once cleared by CIR - Consult for CIR at rec of PT/OT - Continue SBP <140

## 2017-12-21 NOTE — Plan of Care (Signed)
  Problem: Education: Goal: Knowledge of General Education information will improve Description Including pain rating scale, medication(s)/side effects and non-pharmacologic comfort measures Outcome: Progressing   Problem: Clinical Measurements: Goal: Will remain free from infection Outcome: Progressing Goal: Respiratory complications will improve Outcome: Completed/Met   Problem: Activity: Goal: Risk for activity intolerance will decrease Outcome: Progressing Note:  Patient able to get OOB to chair twice now.    Problem: Nutrition: Goal: Adequate nutrition will be maintained Outcome: Progressing Note:  Diet advanced today. Patient states she is hungry.    Problem: Coping: Goal: Level of anxiety will decrease Outcome: Progressing   Problem: Pain Managment: Goal: General experience of comfort will improve Outcome: Progressing   Problem: Safety: Goal: Ability to remain free from injury will improve Outcome: Progressing   Problem: Health Behavior/Discharge Planning: Goal: Ability to manage health-related needs will improve Outcome: Progressing   Problem: Nutrition: Goal: Risk of aspiration will decrease Outcome: Progressing   Problem: Intracerebral Hemorrhage Tissue Perfusion: Goal: Complications of Intracerebral Hemorrhage will be minimized Outcome: Progressing

## 2017-12-21 NOTE — Consult Note (Signed)
Physical Medicine and Rehabilitation Consult   Reason for Consult: Functional deficits Referring Physician: Dr. Kathyrn Sheriff   HPI: [name] is a 84 y.o. female with history of CAD, CAF-on Xarelto, pulmonary hypertension, Barrett's esophagus with GERD; who was admitted on 12/18/2017 with complaints of headache and blurry vision. History taken from chart review and daughter. CT head reviewed, showing right IVH.  Per report, large IVH filling most of right lateral ventricle with extension, developing unilateral right lateral ventricle hydrocephalus with right to left shift of septum pellucidum and generalized atrophy. CTA head was negative for aneurysm, AVM and bleed felt to be due to amyloid angiopathy v/s HTN.  Elevated BP treated with Cleviprex and patient felt to have a spontaneous bleed. Dr. Kathyrn Sheriff consulted and recommended Xarelto reversed with systolic, blood pressure goals recommended less than 140 and monitoring without need for surgical intervention. Has been neurologically stable and CIR recommended due to functional deficits.   Review of Systems  Unable to perform ROS: Mental acuity   Past Medical History:  Diagnosis Date  . Anxiety   . Arthritis   . Barrett's esophagus   . Chronic atrial fibrillation    a. Pt previously declined DCCV.  Marland Kitchen Coronary artery disease    a. s/p Cypher DES to LAD in 2006;  b. Last LHC 02/2008:  pLAD 30%, mLAD stent patent, pOM 50%, EF 65%;  c. Lexiscan Myoview 7/14:  Intermediate risk, dist ant and inf-lat ischemia, EF 66% - patient declined cath and elected medical therapy.  Marland Kitchen GERD (gastroesophageal reflux disease)   . Hiatal hernia   . Hx of echocardiogram    Echocardiogram 08/17/12: EF 55-60%, MAC, mild MR, mild LAE, mild RVE, mild to moderate RAE, moderate TR, mildly increased pulmonary artery systolic pressures  . Hyperlipidemia   . Hypertension   . Mitral regurgitation    a. Mod by echo 10/2014.  . Obesity   . Pulmonary  hypertension ([place])    a. Mod-severely elevated PA pressures by echo 10/2014.  Marland Kitchen QT prolongation    a. During 10/2014 admit, QTC 564m.  . Tricuspid regurgitation    a. Mod by echo 10/2014.  .Marland KitchenUTI (lower urinary tract infection) 03/2015    Past Surgical History:  Procedure Laterality Date  . ABDOMINAL HYSTERECTOMY    . ANKLE SURGERY    . BREAST EXCISIONAL BIOPSY Right 1970  . BREAST EXCISIONAL BIOPSY Right   . BREAST EXCISIONAL BIOPSY Left   . CESAREAN SECTION    . CORONARY ANGIOPLASTY    . ESOPHAGOGASTRODUODENOSCOPY      Family History  Problem Relation Age of Onset  . Diabetes Mother   . Heart attack Mother   . Lung cancer Father   . Thyroid disease Sister   . Thyroid disease Sister   . Hypertension Sister   . Hypertension Sister   . Hypertension Sister   . Hypertension Sister   . Hypertension Brother   . Hypertension Brother   . Breast cancer Other 45  . Colon cancer Neg Hx   . Colon polyps Neg Hx   . Esophageal cancer Neg Hx   . Stroke Neg Hx     Social History:  [place] Per reports  she has quit smoking. Her smoking use included cigarettes. She has never used smokeless tobacco. Per reports she does not drink alcohol or use drugs.   Allergies  Allergen Reactions  . Amlodipine Shortness Of Breath and Swelling  Edema in legs  . Methyldopa Shortness Of Breath and Swelling  . Actonel [Risedronate Sodium] Other (See Comments)    Caused a lump to come up in throat  . Ciprofloxacin Hcl     hallucinations  . Hctz [Hydrochlorothiazide] Itching    Pt reports causing "itching under the skin, with no rash noted."  . Hydralazine Hcl     Unknown, per pt   . Lisinopril Cough  . Oxycodone Hcl     hallucinations  . Spironolactone     Itchiing  . Tape Itching  . Valsartan     Unknown, per pt    Medications Prior to Admission  Medication Sig Dispense Refill  . acetaminophen (TYLENOL) 500 MG tablet Take 500 mg by mouth every 6 (six) hours as  needed for mild pain.    . calcium carbonate (OS-CAL) 600 MG TABS Take 600 mg by mouth daily with breakfast.     . carboxymethylcellulose (REFRESH PLUS) 0.5 % SOLN Place 1 drop into both eyes as needed (dry eyes).    . Chlorphen-Phenyleph-ASA (ALKA-SELTZER PLUS COLD PO) Take 1 tablet by mouth daily as needed (cold symptoms).    . Cholecalciferol (VITAMIN D-3) 1000 UNITS CAPS Take 1 capsule by mouth daily.    Marland Kitchen LORazepam (ATIVAN) 0.5 MG tablet Take 1 tablet (0.5 mg total) by mouth every 8 (eight) hours as needed for anxiety. 30 tablet 3  . magnesium chloride (SLOW-MAG) 64 MG TBEC SR tablet Take 1 tablet (64 mg total) by mouth 2 (two) times daily. 60 tablet 0  . metoprolol tartrate (LOPRESSOR) 25 MG tablet TAKE ONE TABLET BY MOUTH TWICE A DAY 180 tablet 2  . nitroGLYCERIN (NITROSTAT) 0.4 MG SL tablet Place 1 tablet (0.4 mg total) under the tongue every 5 (five) minutes as needed for chest pain (3 doses only). 25 tablet 4  . omeprazole (PRILOSEC) 20 MG capsule Take 20 mg by mouth daily.    Marland Kitchen oxybutynin (DITROPAN) 5 MG tablet Take 5 mg by mouth daily.    . potassium chloride SA (K-DUR,KLOR-CON) 20 MEQ tablet Take 1 tablet by mouth daily.    Marland Kitchen telmisartan (MICARDIS) 20 MG tablet Take 1 tablet by mouth daily.    . vitamin B-12 (CYANOCOBALAMIN) 1000 MCG tablet Take 1,000 mcg by mouth daily.    . vitamin C (ASCORBIC ACID) 500 MG tablet Take 500 mg by mouth daily.    . vitamin E 400 UNIT capsule Take 400 Units by mouth daily.      Home: Home Living Family/patient expects to be discharged to:: Private residence Living Arrangements: Children Additional Comments: no family present at this time, patient non reliable historian  Functional History: Prior Function Comments: Pt unable to provide information and no family present Functional Status:  Mobility: Bed Mobility Overal bed mobility: Needs Assistance Bed Mobility: Rolling, Supine to Sit Rolling: Max assist, +2 for physical assistance Supine to  sit: Max assist, +2 for physical assistance General bed mobility comments: max assisst to intiate movement and elevate trunk to upright at EOB Transfers Overall transfer level: Needs assistance Equipment used: 2 person hand held assist(face to face ) Transfers: Sit to/from Stand, Stand Pivot Transfers Sit to Stand: Max assist, +2 physical assistance Stand pivot transfers: Max assist, +2 physical assistance General transfer comment: Max to total assist for transfers due to patients poor spatial awareness and proprioception. Patient also with heavy inadvertant resistance in opposition to the left (pushers like). (unable to reach upright) Ambulation/Gait General Gait Details: NT  ADL: ADL Overall ADL's : Needs assistance/impaired Eating/Feeding: Moderate assistance, Sitting Grooming: Maximal assistance, Sitting Grooming Details (indicate cue type and reason): Presenting with poor coorindation and proprioception during hadn hygiene. Requiring Max A to bring hands towards at midline and then rub hands together. When asking pt to bring her hands together, pt reaching her right hand to her left elbow and rubbing. Upper Body Bathing: Maximal assistance, Sitting Lower Body Bathing: Maximal assistance, +2 for physical assistance, Sit to/from stand Upper Body Dressing : Maximal assistance, Sitting Lower Body Dressing: Total assistance, Sit to/from stand Toilet Transfer: Maximal assistance, +2 for physical assistance, Stand-pivot, BSC Toileting- Clothing Manipulation and Hygiene: Maximal assistance, +2 for physical assistance Toileting - Clothing Manipulation Details (indicate cue type and reason): Max A +2 to stand, maintain standing, and perform peri care with second person. Pt able to perform peri care while sitting in recliner with back of chair reclined back. Pt perseverating on cleanign peri care.  Functional mobility during ADLs: Maximal assistance, +2 for physical assistance(stand pivot  only) General ADL Comments: Pt requiring Max A +2 for ADLs and functional mobility due to poor strength, balance, cognition, and significant pushing left.   Cognition: Cognition Overall Cognitive Status: Impaired/Different from baseline Orientation Level: Oriented to person Cognition Arousal/Alertness: Awake/alert Behavior During Therapy: Restless, Impulsive Overall Cognitive Status: Impaired/Different from baseline Area of Impairment: Orientation, Attention, Memory, Following commands, Safety/judgement, Awareness, Problem solving Orientation Level: Person Current Attention Level: Sustained Memory: Decreased short-term memory, Decreased recall of precautions Following Commands: Follows one step commands inconsistently, Follows one step commands with increased time Safety/Judgement: Decreased awareness of safety, Decreased awareness of deficits Awareness: Intellectual Problem Solving: Slow processing, Decreased initiation, Difficulty sequencing, Requires verbal cues, Requires tactile cues General Comments: Pt reporting that she is at home with her poodle. Poor following of cues and requiring Max tactile, verbal, and visual cues for ADLs and safety. Pt with poor awareness of deficits and safety satating "I could have walked to the bathroom" when she required Max A +2 for sit<>stand transfer at [facility]   Blood pressure (!) 134/59, pulse 97, temperature 98.7 F (37.1 C), temperature source Axillary, resp. rate 15, weight 76.4 kg, SpO2 97 %. Physical Exam  Nursing note and vitals reviewed. Constitutional: She appears well-developed. She appears lethargic. No distress.  Frail appearing elderly female who was barely able to open eyes  HENT:  Head: Normocephalic and atraumatic.  Eyes: Right eye exhibits no discharge. Left eye exhibits no discharge.  Only briefly opens eyes  Neck: Normal range of motion. Neck supple.  Cardiovascular:  Irregularly irregular  Respiratory: Effort normal and breath  sounds normal.  GI: Soft. Bowel sounds are normal.  Musculoskeletal:  Min edema LUE with diffuse ecchymosis.  RUE edema  Neurological: She appears lethargic.  Oriented x2.  Slurred speech. Motor: RUE: 4-/5 proximal to distal RLE: 4/5 proximal to distal LUE: 0/5 proximal to distal LLE: 3-/5 proximal to distal  Skin: Skin is warm and dry. She is not diaphoretic.  See above  Psychiatric: Her affect is blunt. Her speech is delayed and slurred. She is slowed. Cognition and memory are impaired. She expresses inappropriate judgment.    Results for orders placed or performed during the hospital encounter of 12/18/17 (from the past 24 hour(s))  Triglycerides     Status: Abnormal   Collection Time: 12/21/17  4:48 AM  Result Value Ref Range   Triglycerides 212 (H) <150 mg/dL   No results found.  Assessment/Plan: Diagnosis: Right IVH Labs and  images (see above) independently reviewed.  Records reviewed and summated above.  1. Does the need for close, 24 hr/day medical supervision in concert with the patient's rehab needs make it unreasonable for this patient to be served in a less intensive setting? Yes  2. Co-Morbidities requiring supervision/potential complications: CAD (cont meds), CAF(resume Xarelto per Neurosurg), pulmonary hypertension (Monitor in accordance with increased physical activity and avoid UE resistance excercises), Barrett's esophagus with GERD (Cont meds), leukocytosis (repeat labs, cont to monitor for signs and symptoms of infection, further workup if indicated) 3. Due to bladder management, safety, skin/wound care, disease management, medication administration, pain management and patient education, does the patient require 24 hr/day rehab nursing? Yes 4. Does the patient require coordinated care of a physician, rehab nurse, PT (1-2 hrs/day, 5 days/week), OT (1-2 hrs/day, 5 days/week) and SLP (1-2 hrs/day, 5 days/week) to address physical and functional deficits in the  context of the above medical diagnosis(es)? Yes Addressing deficits in the following areas: balance, endurance, locomotion, strength, transferring, bathing, dressing, toileting, cognition, speech, language, swallowing and psychosocial support 5. Can the patient actively participate in an intensive therapy program of at least 3 hrs of therapy per day at least 5 days per week? Yes 6. The potential for patient to make measurable gains while on inpatient rehab is excellent 7. Anticipated functional outcomes upon discharge from inpatient rehab are min assist and mod assist  with PT, min assist and mod assist with OT, min assist with SLP. 8. Estimated rehab length of stay to reach the above functional goals is: 20-23 days. 9. Anticipated D/C setting: Home 10. Anticipated post D/C treatments: HH therapy and Home excercise program 11. Overall Rehab/Functional Prognosis: good  RECOMMENDATIONS: This patient's condition is appropriate for continued rehabilitative care in the following setting: CIR when medically stable Patient has agreed to participate in recommended program. Potentially Note that insurance prior authorization may be required for reimbursement for recommended care.  Comment: Rehab Admissions Coordinator to follow up.   I have personally performed a face to face diagnostic evaluation, including, but not limited to relevant history and physical exam findings, of this patient and developed relevant assessment and plan.  Additionally, I have reviewed and concur with the physician assistant's documentation above.   Delice Lesch, MD, ABPMR Bary Leriche, PA-C 12/21/2017

## 2017-12-21 NOTE — Progress Notes (Addendum)
  Speech Language Pathology Treatment: Dysphagia  Patient Details Name: [name] MRN: [id number] DOB: 1926/09/13 Today's Date: 12/21/2017 Time: 0340-3524 SLP Time Calculation (min) (ACUTE ONLY): 33 min  Assessment / Plan / Recommendation Clinical Impression  Pt without overt s/s of aspiration with varying volumes of thin liquids during PO trial of thin via spoon/cup/straw, puree and soft solids; impaired mastication and slow oral transit noted with cracker, although this consistency was dry and pt wore ill-fitting dentures during PO trial; pt's alertness level increased during this session; recommend conservative progression to Dysphagia 1/thin; ST will f/u for diet tolerance/education re: swallowing/aspiration precautions with pt/family; SLE completed this date which noted left neglect/decreased cognition (pt has hx of memory loss per daughter, but was independent with all ADLs prior to this hospitalization).  HPI HPI: 84 y.o. female on Xarelto for afib presenting with sudden onset HA and confusion, in setting of spontaneous right IVH. pmh Afib, CAD, HTN. Passes YALE swallow screen however presenting with s/s of aspiration per RN. Swallow evaluation ordered.       SLP Plan  Continue with current plan of care       Recommendations  Diet recommendations: Dysphagia 1 (puree);Thin liquid Liquids provided via: Cup;Straw Medication Administration: Crushed with puree Supervision: Patient able to self feed;Staff to assist with self feeding;Full supervision/cueing for compensatory strategies Compensations: Slow rate;Small sips/bites Postural Changes and/or Swallow Maneuvers: Seated upright 90 degrees;Upright 30-60 min after meal                General recommendations: Rehab consult Oral Care Recommendations: Oral care BID Follow up Recommendations: Other (comment)(TBD) SLP Visit Diagnosis: Dysphagia, oropharyngeal phase (R13.12) Plan: Continue with current plan of care                       Elvina Sidle, M.S., CCC-SLP 12/21/2017, 3:15 PM

## 2017-12-21 NOTE — Evaluation (Addendum)
Speech Language Pathology Evaluation Patient Details Name: [name] MRN: [id number] DOB: 10-Oct-1926 Today's Date: 12/21/2017 Time: 9371-6967 SLP Time Calculation (min) (ACUTE ONLY): 33 min  Problem List:  Patient Active Problem List   Diagnosis Date Noted  . Intraventricular hemorrhage ([place])   . Urinary tract infection without hematuria   . Coronary artery disease involving native coronary artery of native heart without angina pectoris   . Pulmonary HTN ([place])   . Leukocytosis   . Intracranial hemorrhage ([place]) 12/18/2017  . Syncope 04/10/2015  . Sepsis secondary to UTI ([place]) 04/10/2015  . URI (upper respiratory infection) 04/10/2015  . UTI (lower urinary tract infection) 04/10/2015  . QT prolongation 10/12/2014  . Headache 09/02/2012  . Tricuspid valve regurgitation, moderate 08/18/2012  . Hypokalemia 08/16/2012  . Hyponatremia 08/16/2012  . Atrial fibrillation ([place])   . Hypertension   . Anxiety   . Mixed hyperlipidemia 09/11/2009  . GERD 08/30/2008  . CHEST PAIN 08/30/2008  . Coronary atherosclerosis 04/04/2008  . Syncope and collapse 04/04/2008   Past Medical History:  Past Medical History:  Diagnosis Date  . Anxiety   . Arthritis   . Barrett's esophagus   . Chronic atrial fibrillation    a. Pt previously declined DCCV.  Marland Kitchen Coronary artery disease    a. s/p Cypher DES to LAD in 2006;  b. Last LHC 02/2008:  pLAD 30%, mLAD stent patent, pOM 50%, EF 65%;  c. Lexiscan Myoview 7/14:  Intermediate risk, dist ant and inf-lat ischemia, EF 66% - patient declined cath and elected medical therapy.  Marland Kitchen GERD (gastroesophageal reflux disease)   . Hiatal hernia   . Hx of echocardiogram    Echocardiogram 08/17/12: EF 55-60%, MAC, mild MR, mild LAE, mild RVE, mild to moderate RAE, moderate TR, mildly increased pulmonary artery systolic pressures  . Hyperlipidemia   . Hypertension   . Mitral regurgitation    a. Mod by echo 10/2014.  . Obesity   . Pulmonary hypertension ([place])    a. Mod-severely elevated PA pressures by echo 10/2014.  Marland Kitchen QT prolongation    a. During 10/2014 admit, QTC 539m.  . Tricuspid regurgitation    a. Mod by echo 10/2014.  .Marland KitchenUTI (lower urinary tract infection) 03/2015   Past Surgical History:  Past Surgical History:  Procedure Laterality Date  . ABDOMINAL HYSTERECTOMY    . ANKLE SURGERY    . BREAST EXCISIONAL BIOPSY Right 1970  . BREAST EXCISIONAL BIOPSY Right   . BREAST EXCISIONAL BIOPSY Left   . CESAREAN SECTION    . CORONARY ANGIOPLASTY    . ESOPHAGOGASTRODUODENOSCOPY     HPI:  84y.o. female on Xarelto for afib presenting with sudden onset HA and confusion, in setting of spontaneous right IVH. pmh Afib, CAD, HTN. Passes YALE swallow screen however presenting with s/s of aspiration per RN. Swallow evaluation ordered.    Assessment / Plan / Recommendation Clinical Impression   Pt administered portions of MOCA (Montreal Cognitive Assessment) with score obtained 6/22 (total test 26/30 is considered WNL, but visual portion eliminated d/t pt not having reading glasses), but pt had memory deficits prior to hospitalization; prior memory deficits are likely exacerbated with new deficits including attention, awareness, and language, specifically with repetition, calculation, and orientation.  Ongoing cognitive assessment will continue as pt progresses; pt was able to read environmental signs given min verbal cueing from SLP but left neglect was noted; ST will address new deficits and diet tolerance/progression while in acute setting.  SLP Assessment  SLP Recommendation/Assessment: Patient needs continued Speech Language Pathology Services SLP Visit Diagnosis: Dysphagia, oropharyngeal phase (R13.12);Cognitive communication deficit (R41.841)    Follow Up Recommendations  Other (comment)(TBD)    Frequency and Duration min 2x/week  1 week      SLP Evaluation Cognition  Overall Cognitive Status: Impaired/Different from baseline Orientation  Level: Oriented to person;Oriented to situation;Disoriented to time;Disoriented to place Attention: Sustained Sustained Attention: Impaired Sustained Attention Impairment: Verbal basic;Functional basic Memory: Impaired Memory Impairment: Retrieval deficit;Decreased recall of new information;Decreased short term memory Decreased Short Term Memory: Verbal basic;Functional basic Awareness: Impaired Awareness Impairment: Anticipatory impairment;Intellectual impairment Problem Solving: Impaired Problem Solving Impairment: Verbal basic;Functional basic Executive Function: Writer: Impaired Organizing Impairment: Verbal basic;Functional basic Behaviors: Impulsive Safety/Judgment: Impaired       Comprehension  Auditory Comprehension Overall Auditory Comprehension: Appears within functional limits for tasks assessed Yes/No Questions: Within Functional Limits Commands: Impaired Multistep Basic Commands: 25-49% accurate Conversation: Simple Interfering Components: Attention;Working memory EffectiveTechniques: Repetition;Visual/Gestural cues;Increased volume;Extra processing time Visual Recognition/Discrimination Discrimination: Within Function Limits Reading Comprehension Reading Status: Within funtional limits    Expression Expression Primary Mode of Expression: Verbal Verbal Expression Overall Verbal Expression: Appears within functional limits for tasks assessed Level of Generative/Spontaneous Verbalization: Conversation Repetition: Impaired Level of Impairment: Sentence level(d/t memory deficits) Naming: No impairment Interfering Components: Premorbid deficit Non-Verbal Means of Communication: Not applicable Written Expression Dominant Hand: Right Written Expression: Not tested   Oral / Motor  Oral Motor/Sensory Function Overall Oral Motor/Sensory Function: Generalized oral weakness Motor Speech Overall Motor Speech: Appears within functional limits for tasks  assessed Respiration: Within functional limits Phonation: Normal Resonance: Within functional limits Articulation: Within functional limitis Intelligibility: Intelligible Motor Planning: Witnin functional limits Motor Speech Errors: Not applicable                       Elvina Sidle, M.S., CCC-SLP 12/21/2017, 3:31 PM

## 2017-12-21 NOTE — Progress Notes (Signed)
Bladder scan revealed >999.  Patient placed on a bedpan and put in chair position in bed. She was able to void 250cc on her own.   In and out performed, 650 cc obtained.   MD made aware.

## 2017-12-21 NOTE — Progress Notes (Signed)
Physical Therapy Treatment Patient Details Name: [name] MRN: [id number] DOB: [birth year] Today's Date: 12/21/2017    History of Present Illness 84 y.o. female on Xarelto for afib presenting with sudden onset HA and confusion, in setting of SAH. pmh Afib, CAD, HTn    PT Comments    Pt pleasant on arrival but confused and stating she sees pollen on the walls. RN reports the hallucination and increased confusion is new. Pt with increased activity tolerance, remains limited by pushing and weakness with 2 person assist for mobility. Pt fatigued and unable to maintain eyes open end of session pt stating "they are open". Will continue to follow.    Follow Up Recommendations  CIR;Supervision/Assistance - 24 hour     Equipment Recommendations  Other (comment)(TBD)    Recommendations for Other Services       Precautions / Restrictions Precautions Precautions: Fall Precaution Comments: pushing left    Mobility  Bed Mobility Overal bed mobility: Needs Assistance Bed Mobility: Supine to Sit     Supine to sit: Max assist;+2 for physical assistance     General bed mobility comments: max assist to move legs off of bed and elevate trunk from surface with use of pad to pivot to EOB, pt pushing left EOb with trunk support at left and RUE in lap to prevent pushing. Worked on propping on Right forearm EOB with mod assist to achieve semi sidelying and min assist to fully correct to midline with max cues to place RUE on lap to prevent pushing  Transfers Overall transfer level: Needs assistance   Transfers: Sit to/from [employer] Transfers Sit to Stand: Mod assist;+2 physical assistance Stand pivot transfers: Max assist;+2 physical assistance       General transfer comment: mod assist to rise from elevated bed with 2 person assist with pad and belt, max +2 to control pivot to right with cues for sequence and max assist to control descent to surface  Ambulation/Gait              General Gait Details: unable   Stairs             Wheelchair Mobility    Modified Rankin (Stroke Patients Only) Modified Rankin (Stroke Patients Only) Pre-Morbid Rankin Score: No symptoms Modified Rankin: Severe disability     Balance Overall balance assessment: Needs assistance Sitting-balance support: Feet supported Sitting balance-Leahy Scale: Poor Sitting balance - Comments: pushing left, min assist with sitting with support at left side     Standing balance-Leahy Scale: Zero Standing balance comment: bil UE support and left knee blocked for standing balance                            Cognition Arousal/Alertness: Awake/alert   Overall Cognitive Status: Impaired/Different from baseline Area of Impairment: Orientation;Memory;Following commands;Safety/judgement;Problem solving;Awareness                 Orientation Level: Disoriented to;Place;Time Current Attention Level: Sustained Memory: Decreased short-term memory Following Commands: Follows one step commands inconsistently;Follows one step commands with increased time Safety/Judgement: Decreased awareness of safety;Decreased awareness of deficits   Problem Solving: Slow processing;Decreased initiation;Difficulty sequencing;Requires verbal cues;Requires tactile cues General Comments: pt stating she sees pollen on the walls, stating the walls are leaking. pt needing cues to maintain eyes open and with both eyes closed end of session cues tor opening and states "they are open"      Exercises General Exercises - Lower  Extremity Long Arc Quad: AAROM;15 reps;Seated;Both Hip Flexion/Marching: AAROM;15 reps;Seated;Both Toe Raises: AROM;AAROM;10 reps;Seated;Right;Left(AAROM on LLE)    General Comments        Pertinent Vitals/Pain Faces Pain Scale: Hurts little more Pain Location: arms with touch Pain Descriptors / Indicators: Sore;Tender Pain Intervention(s): Limited activity within  patient's tolerance;Repositioned;Monitored during session    Home Living                      Prior Function            PT Goals (current goals can now be found in the care plan section) Progress towards PT goals: Progressing toward goals    Frequency           PT Plan Current plan remains appropriate    Co-evaluation              AM-PAC PT "6 Clicks" Daily Activity  Outcome Measure  Difficulty turning over in bed (including adjusting bedclothes, sheets and blankets)?: Unable Difficulty moving from lying on back to sitting on the side of the bed? : Unable Difficulty sitting down on and standing up from a chair with arms (e.g., wheelchair, bedside commode, etc,.)?: Unable Help needed moving to and from a bed to chair (including a wheelchair)?: A Lot Help needed walking in hospital room?: Total Help needed climbing 3-5 steps with a railing? : Total 6 Click Score: 7    End of Session Equipment Utilized During Treatment: Gait belt Activity Tolerance: Patient tolerated treatment well Patient left: in chair;with call bell/phone within reach;with chair alarm set Nurse Communication: Mobility status;Need for lift equipment PT Visit Diagnosis: Other symptoms and signs involving the nervous system (R29.898);Other abnormalities of gait and mobility (R26.89);Muscle weakness (generalized) (M62.81)     Time: 4709-6283 PT Time Calculation (min) (ACUTE ONLY): 26 min  Charges:  $Therapeutic Exercise: 8-22 mins $Therapeutic Activity: 8-22 mins                     [place], PT Acute Rehabilitation Services Pager: [phone] Office: [place] 12/21/2017, 11:06 AM

## 2017-12-21 NOTE — Progress Notes (Signed)
Patient was able to take medications crushed in apple sauce today, per SLP recommendations yesterday, with no signs of aspiration.   However, when given thin liquids with or without a straw, patient coughed vigorously. In order to d/c clevaprex drip I will need to be able to safely administer PO medications for replacement.   Will call speech therpay and ask for specific recommendations.

## 2017-12-22 MED ORDER — ONDANSETRON HCL 4 MG/2ML IJ SOLN
4.0000 mg | Freq: Four times a day (QID) | INTRAMUSCULAR | Status: DC | PRN
  Administered 2017-12-22 – 2017-12-23 (×2): 4 mg via INTRAVENOUS
  Filled 2017-12-22: qty 2

## 2017-12-22 MED ORDER — ONDANSETRON HCL 4 MG/2ML IJ SOLN
INTRAMUSCULAR | Status: AC
  Filled 2017-12-22: qty 2

## 2017-12-22 MED ORDER — HYDROCODONE-ACETAMINOPHEN 5-325 MG PO TABS
1.0000 | ORAL_TABLET | Freq: Four times a day (QID) | ORAL | Status: DC | PRN
  Administered 2017-12-22: 1 via ORAL
  Administered 2017-12-22 – 2017-12-23 (×3): 2 via ORAL
  Filled 2017-12-22: qty 2
  Filled 2017-12-22: qty 1
  Filled 2017-12-22 (×2): qty 2

## 2017-12-22 MED ORDER — TAMSULOSIN HCL 0.4 MG PO CAPS
0.4000 mg | ORAL_CAPSULE | Freq: Every day | ORAL | Status: DC
  Administered 2017-12-22 – 2017-12-23 (×2): 0.4 mg via ORAL
  Filled 2017-12-22 (×2): qty 1

## 2017-12-22 NOTE — Progress Notes (Signed)
  NEUROSURGERY PROGRESS NOTE   Urinary retention requiring I/O. Complains of mild headache  EXAM:  BP (!) 153/63   Pulse 81   Temp 99.2 F (37.3 C) (Oral)   Resp 15   Wt 76.4 kg   SpO2 96%   BMI 28.03 kg/m   Awake, alert, oriented  Speech fluent, appropriate  CN grossly intact  MAEW, R>L  IMPRESSION/PLAN 84 y.o. female s/p spontaneous right IVH. She remains neurologically intact. No signs of hydrocephalus. - Transitioned over to po yesterday - BP goal <160 - Urinary retention: unclear etiology. Started flomax. - Stable medically for d/c to CIR when bed available

## 2017-12-22 NOTE — Progress Notes (Signed)
Physical Therapy Treatment Patient Details Name: [name] MRN: [id number] DOB: [birth year] Today's Date: 12/22/2017    History of Present Illness 84 y.o. female on Xarelto for afib presenting with sudden onset HA and confusion, in setting of SAH. pmh Afib, CAD, HTn    PT Comments    Pt pleasant with eyes closed on arrival and states her head hurts less when eyes are closed. Pt educated for opening eyes, attending to left side of environment. Pt with assist for pericare and linen change during session with increased attention and focus to task without hallucinations during session today. Pt maintains left inattention and left bias with mobility. Facilitation for midline and attention to environment throughout. Pt working with OT for feeding at end of session.  VSS    Follow Up Recommendations  CIR;Supervision/Assistance - 24 hour     Equipment Recommendations  Other (comment)(TBD)    Recommendations for Other Services       Precautions / Restrictions Precautions Precautions: Fall Precaution Comments: pushing left    Mobility  Bed Mobility Overal bed mobility: Needs Assistance Bed Mobility: Supine to Sit     Supine to sit: +2 for safety/equipment;HOB elevated;Max assist     General bed mobility comments: max assist to shift trunk to midline and elevate trunk from surface, cues for sequence with pt able to move LLE off of bed without assist, RLE hanging semi off bed on arrival  Transfers Overall transfer level: Needs assistance   Transfers: Sit to/from Stand Sit to Stand: Mod assist;+2 physical assistance Stand pivot transfers: Max assist;+2 physical assistance       General transfer comment: mod assist to rise from elevated bed with 2 person assist with 3 musketeer sequence with assist for anterior translation and rise. Pivot grossly 2' side stepping toward right then turning to chair to right with bil UE support on therapist shoulders. Cues and facilitation for  trunk and hip extension. Pt positioned with wedge and pillows in chair  Ambulation/Gait             General Gait Details: unable   Stairs             Wheelchair Mobility    Modified Rankin (Stroke Patients Only) Modified Rankin (Stroke Patients Only) Pre-Morbid Rankin Score: No symptoms Modified Rankin: Severe disability     Balance Overall balance assessment: Needs assistance Sitting-balance support: Feet supported Sitting balance-Leahy Scale: Poor Sitting balance - Comments: leaning left with cues for midline and min-mod assist for sitting balance.  Postural control: Left lateral lean   Standing balance-Leahy Scale: Poor Standing balance comment: bil UE supported in standing                            Cognition Arousal/Alertness: Awake/alert Behavior During Therapy: Restless Overall Cognitive Status: Impaired/Different from baseline Area of Impairment: Orientation;Memory;Following commands;Safety/judgement;Problem solving;Awareness                 Orientation Level: Disoriented to;Place;Time Current Attention Level: Sustained Memory: Decreased short-term memory Following Commands: Follows one step commands inconsistently;Follows one step commands with increased time Safety/Judgement: Decreased awareness of safety;Decreased awareness of deficits   Problem Solving: Slow processing;Decreased initiation;Difficulty sequencing;Requires verbal cues;Requires tactile cues General Comments: pt without hallucination today, left inattention, left bias      Exercises      General Comments        Pertinent Vitals/Pain Faces Pain Scale: Hurts little more Pain Location: all extremities  with touch Pain Descriptors / Indicators: Sore;Tender Pain Intervention(s): Limited activity within patient's tolerance;Repositioned;Monitored during session    Home Living                      Prior Function            PT Goals (current goals can  now be found in the care plan section) Progress towards PT goals: Progressing toward goals    Frequency           PT Plan Current plan remains appropriate    Co-evaluation PT/OT/SLP Co-Evaluation/Treatment: Yes Reason for Co-Treatment: Complexity of the patient's impairments (multi-system involvement);For patient/therapist safety PT goals addressed during session: Mobility/safety with mobility;Balance        AM-PAC PT "6 Clicks" Daily Activity  Outcome Measure  Difficulty turning over in bed (including adjusting bedclothes, sheets and blankets)?: Unable Difficulty moving from lying on back to sitting on the side of the bed? : Unable Difficulty sitting down on and standing up from a chair with arms (e.g., wheelchair, bedside commode, etc,.)?: Unable Help needed moving to and from a bed to chair (including a wheelchair)?: A Lot Help needed walking in hospital room?: Total Help needed climbing 3-5 steps with a railing? : Total 6 Click Score: 7    End of Session Equipment Utilized During Treatment: Gait belt Activity Tolerance: Patient tolerated treatment well Patient left: in chair;with call bell/phone within reach;with chair alarm set;with family/visitor present Nurse Communication: Mobility status;Need for lift equipment PT Visit Diagnosis: Other symptoms and signs involving the nervous system (R29.898);Other abnormalities of gait and mobility (R26.89);Muscle weakness (generalized) (M62.81)     Time: 1610-9604 PT Time Calculation (min) (ACUTE ONLY): 28 min  Charges:  $Therapeutic Activity: 8-22 mins                     [place], PT Acute Rehabilitation Services Pager: [phone] Office: [place] 12/22/2017, 1:31 PM

## 2017-12-22 NOTE — Progress Notes (Signed)
Pt unable to void, bladder scan volume >520ml, okay to in/out cath per Dr. Zada Finders.

## 2017-12-22 NOTE — Progress Notes (Signed)
IP rehab admissions - I am following for potential acute inpatient rehab admission.  Patient currently sleeping soundly and family not in the room.  I will contract family to discuss rehab options.  I will follow up in am.  Call me for questions.  [phone]

## 2017-12-22 NOTE — Progress Notes (Signed)
  Speech Language Pathology Treatment: Dysphagia  Patient Details Name: [name] MRN: [id number] DOB: Jun 29, 1926 Today's Date: 12/22/2017 Time: 2992-4268 SLP Time Calculation (min) (ACUTE ONLY): 21 min  Assessment / Plan / Recommendation Clinical Impression  Limited skilled treatment with min verbal cues provided during intake of thin via straw, puree and soft solids with impaired mastication noted, but improved overall bolus formation/liquid wash required d/t xerostomia during intake; pt with increased appetite this date per nursing; taking medications with liquids/whole without difficulty; pt oriented to self/situation and place this date which is improved from tx session on 12/21/17.  Pt answering questions appropriately, but requires min verbal cues for decreased sustained attention during intake; recommend progressing diet to Dysphagia 2 (chopped)/thin liquids; ST will continue to f/u for diet tolerance/cognition/language; pending CIR transfer.  HPI HPI: 84 y.o. female on Xarelto for afib presenting with sudden onset HA and confusion, in setting of spontaneous right IVH. pmh Afib, CAD, HTN. Passes YALE swallow screen however presenting with s/s of aspiration per RN. Swallow evaluation ordered.       SLP Plan  Continue with current plan of care       Recommendations  Diet recommendations: Dysphagia 2 (fine chop);Thin liquid Liquids provided via: Cup;Straw Medication Administration: Crushed with puree Supervision: Patient able to self feed;Staff to assist with self feeding;Full supervision/cueing for compensatory strategies Compensations: Slow rate;Small sips/bites Postural Changes and/or Swallow Maneuvers: Seated upright 90 degrees;Upright 30-60 min after meal                General recommendations: Rehab consult Oral Care Recommendations: Oral care BID Follow up Recommendations: Inpatient Rehab SLP Visit Diagnosis: Dysphagia, oropharyngeal phase (R13.12) Plan: Continue  with current plan of care                       Elvina Sidle, M.S., CCC-SLP 12/22/2017, 4:00 PM

## 2017-12-22 NOTE — Progress Notes (Signed)
Occupational Therapy Treatment Patient Details Name: [name] MRN: [id number] DOB: [birth year] Today's Date: 12/22/2017    History of present illness 84 y.o. female on Xarelto for afib presenting with sudden onset HA and confusion, in setting of SAH. pmh Afib, CAD, HTn   OT comments  Pt making progress toward goals. Apparent pusher syndrome with L neglect requiring Max A +2 for stand pivot transfer, min A with feeding, Mod A with UB bathing and Max A with LB bathing. Pt will benefit from intensive re[facility] at [facility] but will most likely need 24/7 assistance after DC. Will continue to follow acutely.   Follow Up Recommendations  CIR;Supervision/Assistance - 24 hour    Equipment Recommendations  Other (comment)    Recommendations for Other Services      Precautions / Restrictions Precautions Precautions: Fall Precaution Comments: pushing left       Mobility Bed Mobility Overal bed mobility: Needs Assistance Bed Mobility: Supine to Sit Rolling: Mod assist;+2 for physical assistance            Transfers Overall transfer level: Needs assistance Equipment used: 2 person hand held assist Transfers: Sit to/from [employer] Transfers Sit to Stand: Mod assist;+2 physical assistance Stand pivot transfers: Max assist;+2 physical assistance            Balance Overall balance assessment: Needs assistance   Sitting balance-Leahy Scale: Poor Sitting balance - Comments: L lateral lean; pushing     Standing balance-Leahy Scale: Poor                             ADL either performed or assessed with clinical judgement   ADL Overall ADL's : Needs assistance/impaired Eating/Feeding: Minimal assistance;Sitting Eating/Feeding Details (indicate cue type and reason): Family present. Pt requires mod tactile cues to find utensils located on L side; taking appropriate bite size; educated family on need to limit distractions during meals and to sit toward L side and  cue pt to look L and find objects on tray in L field as needed. Family verbalized understnading. Used bolster and pillow on L side to improve upright midline positioning for feeding. Grooming: Dance movement psychotherapist;Set up       Lower Body Bathing: Maximal assistance Lower Body Bathing Details (indicate cue type and reason): pt washing periarea due to incontinence                     Functional mobility during ADLs: Maximal assistance;+2 for physical assistance General ADL Comments: used over the shoudler technique to facilitaet weight shifts and steps to chair     Vision   Vision Assessment?: Vision impaired- to be further tested in functional context Additional Comments: apparent L field cut   Perception     Praxis      Cognition Arousal/Alertness: Awake/alert Behavior During Therapy: Restless;Impulsive Overall Cognitive Status: Impaired/Different from baseline Area of Impairment: Orientation;Attention;Memory;Following commands;Safety/judgement;Awareness;Problem solving                 Orientation Level: Disoriented to Current Attention Level: Sustained Memory: Decreased short-term memory Following Commands: Follows one step commands inconsistently Safety/Judgement: Decreased awareness of safety;Decreased awareness of deficits Awareness: Intellectual Problem Solving: Slow processing;Decreased initiation;Difficulty sequencing;Requires verbal cues;Requires tactile cues          Exercises     Shoulder Instructions       General Comments      Pertinent Vitals/ Pain       Pain  Assessment: Faces Pain Score: 10-Worst pain ever Faces Pain Scale: Hurts little more Pain Location: headache Pain Descriptors / Indicators: Aching;Headache Pain Intervention(s): Limited activity within patient's tolerance  Home Living                                          Prior Functioning/Environment              Frequency  Min 2X/week         Progress Toward Goals  OT Goals(current goals can now be found in the care plan section)  Progress towards OT goals: Progressing toward goals  Acute Rehab OT Goals Patient Stated Goal: for her head to stop hurting OT Goal Formulation: With patient/family Time For Goal Achievement: 01/03/18 Potential to Achieve Goals: Good  Plan Discharge plan remains appropriate    Co-evaluation    PT/OT/SLP Co-Evaluation/Treatment: Yes Reason for Co-Treatment: Complexity of the patient's impairments (multi-system involvement);For patient/therapist safety;To address functional/ADL transfers   OT goals addressed during session: ADL's and self-care      AM-PAC PT "6 Clicks" Daily Activity     Outcome Measure   Help from another person eating meals?: A Lot Help from another person taking care of personal grooming?: A Lot Help from another person toileting, which includes using toliet, bedpan, or urinal?: A Lot Help from another person bathing (including washing, rinsing, drying)?: A Lot Help from another person to put on and taking off regular upper body clothing?: A Lot Help from another person to put on and taking off regular lower body clothing?: Total 6 Click Score: 11    End of Session Equipment Utilized During Treatment: Gait belt  OT Visit Diagnosis: Unsteadiness on feet (R26.81);Other abnormalities of gait and mobility (R26.89);Muscle weakness (generalized) (M62.81);Pain;Low vision, both eyes (H54.2);Ataxia, unspecified (R27.0);Hemiplegia and hemiparesis;Other symptoms and signs involving cognitive function Hemiplegia - Right/Left: Left Hemiplegia - dominant/non-dominant: Non-Dominant Hemiplegia - caused by: Cerebral infarction Pain - part of body: ("all over")   Activity Tolerance Patient tolerated treatment well   Patient Left in chair;with call bell/phone within reach;with chair alarm set;with family/visitor present   Nurse Communication Mobility status;Need for lift  equipment        Time: 1250-1338 OT Time Calculation (min): 48 min  Charges: OT General Charges $OT Visit: 1 Visit OT Treatments $Self Care/Home Management : 23-37 mins  Maurie Boettcher, OT/L   Acute OT Clinical Specialist [place] Pager [phone] Office [phone]    [facility] 12/22/2017, 6:12 PM

## 2017-12-23 ENCOUNTER — Encounter (HOSPITAL_COMMUNITY): Payer: Self-pay

## 2017-12-23 ENCOUNTER — Other Ambulatory Visit: Payer: Self-pay

## 2017-12-23 ENCOUNTER — Inpatient Hospital Stay (HOSPITAL_COMMUNITY)
Admission: RE | Admit: 2017-12-23 | Discharge: 2018-01-18 | DRG: 056 | Disposition: A | Discharge status: Skilled Nursing Facility | Payer: Medicare Other | Source: Intra-hospital | Attending: Physical Medicine & Rehabilitation | Admitting: Physical Medicine & Rehabilitation

## 2017-12-23 DIAGNOSIS — R339 Retention of urine, unspecified: Secondary | ICD-10-CM | POA: Diagnosis not present

## 2017-12-23 DIAGNOSIS — D72829 Elevated white blood cell count, unspecified: Secondary | ICD-10-CM | POA: Diagnosis not present

## 2017-12-23 DIAGNOSIS — E871 Hypo-osmolality and hyponatremia: Secondary | ICD-10-CM | POA: Diagnosis present

## 2017-12-23 DIAGNOSIS — Z955 Presence of coronary angioplasty implant and graft: Secondary | ICD-10-CM | POA: Diagnosis not present

## 2017-12-23 DIAGNOSIS — D62 Acute posthemorrhagic anemia: Secondary | ICD-10-CM

## 2017-12-23 DIAGNOSIS — Z881 Allergy status to other antibiotic agents status: Secondary | ICD-10-CM

## 2017-12-23 DIAGNOSIS — Z6830 Body mass index (BMI) 30.0-30.9, adult: Secondary | ICD-10-CM

## 2017-12-23 DIAGNOSIS — Z8719 Personal history of other diseases of the digestive system: Secondary | ICD-10-CM

## 2017-12-23 DIAGNOSIS — I951 Orthostatic hypotension: Secondary | ICD-10-CM | POA: Diagnosis present

## 2017-12-23 DIAGNOSIS — R279 Unspecified lack of coordination: Secondary | ICD-10-CM | POA: Diagnosis not present

## 2017-12-23 DIAGNOSIS — I081 Rheumatic disorders of both mitral and tricuspid valves: Secondary | ICD-10-CM | POA: Diagnosis present

## 2017-12-23 DIAGNOSIS — N179 Acute kidney failure, unspecified: Secondary | ICD-10-CM | POA: Diagnosis not present

## 2017-12-23 DIAGNOSIS — R209 Unspecified disturbances of skin sensation: Secondary | ICD-10-CM | POA: Diagnosis not present

## 2017-12-23 DIAGNOSIS — Z888 Allergy status to other drugs, medicaments and biological substances status: Secondary | ICD-10-CM

## 2017-12-23 DIAGNOSIS — E669 Obesity, unspecified: Secondary | ICD-10-CM | POA: Diagnosis present

## 2017-12-23 DIAGNOSIS — Z87440 Personal history of urinary (tract) infections: Secondary | ICD-10-CM

## 2017-12-23 DIAGNOSIS — Z23 Encounter for immunization: Secondary | ICD-10-CM

## 2017-12-23 DIAGNOSIS — R51 Headache: Secondary | ICD-10-CM | POA: Diagnosis not present

## 2017-12-23 DIAGNOSIS — Z801 Family history of malignant neoplasm of trachea, bronchus and lung: Secondary | ICD-10-CM

## 2017-12-23 DIAGNOSIS — R441 Visual hallucinations: Secondary | ICD-10-CM | POA: Diagnosis present

## 2017-12-23 DIAGNOSIS — E876 Hypokalemia: Secondary | ICD-10-CM | POA: Diagnosis present

## 2017-12-23 DIAGNOSIS — F0390 Unspecified dementia without behavioral disturbance: Secondary | ICD-10-CM | POA: Diagnosis present

## 2017-12-23 DIAGNOSIS — R5383 Other fatigue: Secondary | ICD-10-CM | POA: Diagnosis not present

## 2017-12-23 DIAGNOSIS — G936 Cerebral edema: Secondary | ICD-10-CM | POA: Diagnosis present

## 2017-12-23 DIAGNOSIS — Z885 Allergy status to narcotic agent status: Secondary | ICD-10-CM

## 2017-12-23 DIAGNOSIS — F419 Anxiety disorder, unspecified: Secondary | ICD-10-CM | POA: Diagnosis present

## 2017-12-23 DIAGNOSIS — I69191 Dysphagia following nontraumatic intracerebral hemorrhage: Secondary | ICD-10-CM | POA: Diagnosis not present

## 2017-12-23 DIAGNOSIS — K5900 Constipation, unspecified: Secondary | ICD-10-CM | POA: Diagnosis not present

## 2017-12-23 DIAGNOSIS — R4020 Unspecified coma: Secondary | ICD-10-CM | POA: Diagnosis not present

## 2017-12-23 DIAGNOSIS — Z833 Family history of diabetes mellitus: Secondary | ICD-10-CM

## 2017-12-23 DIAGNOSIS — D72828 Other elevated white blood cell count: Secondary | ICD-10-CM | POA: Diagnosis present

## 2017-12-23 DIAGNOSIS — I482 Chronic atrial fibrillation, unspecified: Secondary | ICD-10-CM | POA: Diagnosis present

## 2017-12-23 DIAGNOSIS — Z90710 Acquired absence of both cervix and uterus: Secondary | ICD-10-CM

## 2017-12-23 DIAGNOSIS — I10 Essential (primary) hypertension: Secondary | ICD-10-CM | POA: Diagnosis present

## 2017-12-23 DIAGNOSIS — M6281 Muscle weakness (generalized): Secondary | ICD-10-CM | POA: Diagnosis not present

## 2017-12-23 DIAGNOSIS — R159 Full incontinence of feces: Secondary | ICD-10-CM | POA: Diagnosis present

## 2017-12-23 DIAGNOSIS — K2270 Barrett's esophagus without dysplasia: Secondary | ICD-10-CM | POA: Diagnosis present

## 2017-12-23 DIAGNOSIS — B9620 Unspecified Escherichia coli [E. coli] as the cause of diseases classified elsewhere: Secondary | ICD-10-CM | POA: Diagnosis present

## 2017-12-23 DIAGNOSIS — K219 Gastro-esophageal reflux disease without esophagitis: Secondary | ICD-10-CM | POA: Diagnosis present

## 2017-12-23 DIAGNOSIS — Z91048 Other nonmedicinal substance allergy status: Secondary | ICD-10-CM

## 2017-12-23 DIAGNOSIS — I69122 Dysarthria following nontraumatic intracerebral hemorrhage: Secondary | ICD-10-CM | POA: Diagnosis not present

## 2017-12-23 DIAGNOSIS — Z298 Encounter for other specified prophylactic measures: Secondary | ICD-10-CM

## 2017-12-23 DIAGNOSIS — R0989 Other specified symptoms and signs involving the circulatory and respiratory systems: Secondary | ICD-10-CM

## 2017-12-23 DIAGNOSIS — I272 Pulmonary hypertension, unspecified: Secondary | ICD-10-CM | POA: Diagnosis present

## 2017-12-23 DIAGNOSIS — I69115 Cognitive social or emotional deficit following nontraumatic intracerebral hemorrhage: Secondary | ICD-10-CM | POA: Diagnosis not present

## 2017-12-23 DIAGNOSIS — I169 Hypertensive crisis, unspecified: Secondary | ICD-10-CM | POA: Diagnosis not present

## 2017-12-23 DIAGNOSIS — I629 Nontraumatic intracranial hemorrhage, unspecified: Secondary | ICD-10-CM | POA: Diagnosis not present

## 2017-12-23 DIAGNOSIS — N390 Urinary tract infection, site not specified: Secondary | ICD-10-CM

## 2017-12-23 DIAGNOSIS — I69391 Dysphagia following cerebral infarction: Secondary | ICD-10-CM | POA: Diagnosis not present

## 2017-12-23 DIAGNOSIS — Z7901 Long term (current) use of anticoagulants: Secondary | ICD-10-CM

## 2017-12-23 DIAGNOSIS — I615 Nontraumatic intracerebral hemorrhage, intraventricular: Secondary | ICD-10-CM | POA: Diagnosis not present

## 2017-12-23 DIAGNOSIS — H5340 Unspecified visual field defects: Secondary | ICD-10-CM | POA: Diagnosis present

## 2017-12-23 DIAGNOSIS — R2689 Other abnormalities of gait and mobility: Secondary | ICD-10-CM | POA: Diagnosis not present

## 2017-12-23 DIAGNOSIS — T380X5A Adverse effect of glucocorticoids and synthetic analogues, initial encounter: Secondary | ICD-10-CM | POA: Diagnosis not present

## 2017-12-23 DIAGNOSIS — E46 Unspecified protein-calorie malnutrition: Secondary | ICD-10-CM

## 2017-12-23 DIAGNOSIS — E8809 Other disorders of plasma-protein metabolism, not elsewhere classified: Secondary | ICD-10-CM | POA: Diagnosis present

## 2017-12-23 DIAGNOSIS — R509 Fever, unspecified: Secondary | ICD-10-CM | POA: Diagnosis not present

## 2017-12-23 DIAGNOSIS — M1990 Unspecified osteoarthritis, unspecified site: Secondary | ICD-10-CM | POA: Diagnosis present

## 2017-12-23 DIAGNOSIS — E440 Moderate protein-calorie malnutrition: Secondary | ICD-10-CM | POA: Diagnosis present

## 2017-12-23 DIAGNOSIS — I119 Hypertensive heart disease without heart failure: Secondary | ICD-10-CM | POA: Diagnosis not present

## 2017-12-23 DIAGNOSIS — I69112 Visuospatial deficit and spatial neglect following nontraumatic intracerebral hemorrhage: Secondary | ICD-10-CM | POA: Diagnosis not present

## 2017-12-23 DIAGNOSIS — Z66 Do not resuscitate: Secondary | ICD-10-CM | POA: Diagnosis not present

## 2017-12-23 DIAGNOSIS — I2510 Atherosclerotic heart disease of native coronary artery without angina pectoris: Secondary | ICD-10-CM | POA: Diagnosis present

## 2017-12-23 DIAGNOSIS — I69398 Other sequelae of cerebral infarction: Secondary | ICD-10-CM | POA: Diagnosis not present

## 2017-12-23 DIAGNOSIS — Z7401 Bed confinement status: Secondary | ICD-10-CM | POA: Diagnosis not present

## 2017-12-23 DIAGNOSIS — R569 Unspecified convulsions: Secondary | ICD-10-CM | POA: Diagnosis not present

## 2017-12-23 DIAGNOSIS — G9341 Metabolic encephalopathy: Secondary | ICD-10-CM | POA: Diagnosis not present

## 2017-12-23 DIAGNOSIS — R739 Hyperglycemia, unspecified: Secondary | ICD-10-CM | POA: Diagnosis not present

## 2017-12-23 DIAGNOSIS — E569 Vitamin deficiency, unspecified: Secondary | ICD-10-CM | POA: Diagnosis not present

## 2017-12-23 DIAGNOSIS — Z803 Family history of malignant neoplasm of breast: Secondary | ICD-10-CM

## 2017-12-23 DIAGNOSIS — X58XXXA Exposure to other specified factors, initial encounter: Secondary | ICD-10-CM | POA: Diagnosis not present

## 2017-12-23 DIAGNOSIS — Z7189 Other specified counseling: Secondary | ICD-10-CM | POA: Diagnosis not present

## 2017-12-23 DIAGNOSIS — Z8349 Family history of other endocrine, nutritional and metabolic diseases: Secondary | ICD-10-CM

## 2017-12-23 DIAGNOSIS — I619 Nontraumatic intracerebral hemorrhage, unspecified: Secondary | ICD-10-CM | POA: Diagnosis present

## 2017-12-23 DIAGNOSIS — M2550 Pain in unspecified joint: Secondary | ICD-10-CM | POA: Diagnosis not present

## 2017-12-23 DIAGNOSIS — Z515 Encounter for palliative care: Secondary | ICD-10-CM | POA: Diagnosis not present

## 2017-12-23 DIAGNOSIS — Z87891 Personal history of nicotine dependence: Secondary | ICD-10-CM

## 2017-12-23 DIAGNOSIS — I618 Other nontraumatic intracerebral hemorrhage: Secondary | ICD-10-CM | POA: Diagnosis not present

## 2017-12-23 DIAGNOSIS — R2681 Unsteadiness on feet: Secondary | ICD-10-CM | POA: Diagnosis not present

## 2017-12-23 DIAGNOSIS — Z8249 Family history of ischemic heart disease and other diseases of the circulatory system: Secondary | ICD-10-CM

## 2017-12-23 HISTORY — DX: Other fracture of unspecified lower leg, initial encounter for closed fracture: S82.899A

## 2017-12-23 MED ORDER — BISACODYL 10 MG RE SUPP
10.0000 mg | Freq: Every day | RECTAL | Status: DC | PRN
  Administered 2017-12-24: 10 mg via RECTAL
  Filled 2017-12-23: qty 1

## 2017-12-23 MED ORDER — GUAIFENESIN-DM 100-10 MG/5ML PO SYRP: 5.0000 mL | ORAL_SOLUTION | Freq: Four times a day (QID) | ORAL | Status: DC | PRN

## 2017-12-23 MED ORDER — IRBESARTAN 300 MG PO TABS
150.0000 mg | ORAL_TABLET | Freq: Every day | ORAL | Status: DC
  Administered 2017-12-24 – 2017-12-30 (×7): 150 mg via ORAL
  Filled 2017-12-23 (×7): qty 1

## 2017-12-23 MED ORDER — LEVETIRACETAM 500 MG PO TABS: 500.0000 mg | ORAL_TABLET | Freq: Two times a day (BID) | ORAL | 0 refills | Status: AC

## 2017-12-23 MED ORDER — PROCHLORPERAZINE 25 MG RE SUPP: 12.5000 mg | Freq: Four times a day (QID) | RECTAL | Status: DC | PRN

## 2017-12-23 MED ORDER — HYDROCODONE-ACETAMINOPHEN 5-325 MG PO TABS
1.0000 | ORAL_TABLET | Freq: Four times a day (QID) | ORAL | Status: DC | PRN
  Administered 2017-12-24: 1 via ORAL
  Filled 2017-12-23 (×2): qty 1

## 2017-12-23 MED ORDER — ACETAMINOPHEN 325 MG PO TABS
325.0000 mg | ORAL_TABLET | ORAL | Status: DC | PRN
  Administered 2017-12-23 – 2017-12-28 (×6): 650 mg via ORAL
  Filled 2017-12-23 (×6): qty 2

## 2017-12-23 MED ORDER — NITROGLYCERIN 0.4 MG SL SUBL: 0.4000 mg | SUBLINGUAL_TABLET | SUBLINGUAL | Status: DC | PRN

## 2017-12-23 MED ORDER — VITAMIN C 500 MG PO TABS
500.0000 mg | ORAL_TABLET | Freq: Every day | ORAL | Status: DC
  Administered 2017-12-24 – 2017-12-28 (×4): 500 mg via ORAL
  Filled 2017-12-23 (×5): qty 1

## 2017-12-23 MED ORDER — TAMSULOSIN HCL 0.4 MG PO CAPS
0.4000 mg | ORAL_CAPSULE | Freq: Every day | ORAL | Status: DC
  Administered 2017-12-24 – 2018-01-17 (×25): 0.4 mg via ORAL
  Filled 2017-12-23 (×24): qty 1

## 2017-12-23 MED ORDER — LEVETIRACETAM 500 MG PO TABS
500.0000 mg | ORAL_TABLET | Freq: Two times a day (BID) | ORAL | Status: DC
  Administered 2017-12-23 – 2017-12-28 (×11): 500 mg via ORAL
  Filled 2017-12-23 (×12): qty 1

## 2017-12-23 MED ORDER — TOPIRAMATE 25 MG PO TABS
25.0000 mg | ORAL_TABLET | Freq: Every day | ORAL | Status: DC
  Administered 2017-12-23 – 2017-12-24 (×2): 25 mg via ORAL
  Filled 2017-12-23 (×2): qty 1

## 2017-12-23 MED ORDER — METOPROLOL TARTRATE 25 MG PO TABS
25.0000 mg | ORAL_TABLET | Freq: Two times a day (BID) | ORAL | Status: DC
  Administered 2017-12-23 – 2017-12-29 (×12): 25 mg via ORAL
  Filled 2017-12-23 (×12): qty 1

## 2017-12-23 MED ORDER — LIDOCAINE HCL URETHRAL/MUCOSAL 2 % EX GEL
CUTANEOUS | Status: DC | PRN
  Filled 2017-12-23: qty 30

## 2017-12-23 MED ORDER — PROCHLORPERAZINE MALEATE 5 MG PO TABS: 5.0000 mg | ORAL_TABLET | Freq: Four times a day (QID) | ORAL | Status: DC | PRN

## 2017-12-23 MED ORDER — ONDANSETRON HCL 4 MG/2ML IJ SOLN: 4.0000 mg | Freq: Four times a day (QID) | INTRAMUSCULAR | Status: DC | PRN

## 2017-12-23 MED ORDER — VITAMIN B-12 1000 MCG PO TABS
1000.0000 ug | ORAL_TABLET | Freq: Every day | ORAL | Status: DC
  Administered 2017-12-24 – 2017-12-28 (×4): 1000 ug via ORAL
  Filled 2017-12-23 (×5): qty 1

## 2017-12-23 MED ORDER — MAGNESIUM CHLORIDE 64 MG PO TBEC
1.0000 | DELAYED_RELEASE_TABLET | Freq: Two times a day (BID) | ORAL | Status: DC
  Administered 2017-12-23 – 2017-12-24 (×2): 64 mg via ORAL
  Filled 2017-12-23 (×3): qty 1

## 2017-12-23 MED ORDER — DOCUSATE SODIUM 100 MG PO CAPS
100.0000 mg | ORAL_CAPSULE | Freq: Two times a day (BID) | ORAL | Status: DC
  Administered 2017-12-23 – 2018-01-13 (×37): 100 mg via ORAL
  Filled 2017-12-23 (×43): qty 1

## 2017-12-23 MED ORDER — POTASSIUM CHLORIDE CRYS ER 20 MEQ PO TBCR: 20.0000 meq | EXTENDED_RELEASE_TABLET | Freq: Every day | ORAL | Status: DC

## 2017-12-23 MED ORDER — VITAMIN D 25 MCG (1000 UNIT) PO TABS
1000.0000 [IU] | ORAL_TABLET | Freq: Every day | ORAL | Status: DC
  Administered 2017-12-24 – 2018-01-18 (×24): 1000 [IU] via ORAL
  Filled 2017-12-23 (×18): qty 1

## 2017-12-23 MED ORDER — PANTOPRAZOLE SODIUM 40 MG PO TBEC
40.0000 mg | DELAYED_RELEASE_TABLET | Freq: Every day | ORAL | Status: DC
  Administered 2017-12-24 – 2017-12-29 (×5): 40 mg via ORAL
  Filled 2017-12-23 (×6): qty 1

## 2017-12-23 MED ORDER — ALUM & MAG HYDROXIDE-SIMETH 200-200-20 MG/5ML PO SUSP
30.0000 mL | ORAL | Status: DC | PRN
  Administered 2017-12-28: 30 mL via ORAL
  Filled 2017-12-23: qty 30

## 2017-12-23 MED ORDER — LORAZEPAM 0.5 MG PO TABS: 0.2500 mg | ORAL_TABLET | Freq: Two times a day (BID) | ORAL | Status: DC | PRN

## 2017-12-23 MED ORDER — CEPHALEXIN 250 MG PO CAPS
500.0000 mg | ORAL_CAPSULE | Freq: Two times a day (BID) | ORAL | Status: AC
  Administered 2017-12-23 – 2017-12-24 (×2): 500 mg via ORAL
  Filled 2017-12-23 (×2): qty 2

## 2017-12-23 MED ORDER — POLYETHYLENE GLYCOL 3350 17 G PO PACK
17.0000 g | PACK | Freq: Every day | ORAL | Status: DC | PRN
  Administered 2017-12-23: 17 g via ORAL
  Filled 2017-12-23: qty 1

## 2017-12-23 MED ORDER — VITAMIN E 180 MG (400 UNIT) PO CAPS
400.0000 [IU] | ORAL_CAPSULE | Freq: Every day | ORAL | Status: DC
  Administered 2017-12-24 – 2017-12-28 (×4): 400 [IU] via ORAL
  Filled 2017-12-23 (×5): qty 1

## 2017-12-23 MED ORDER — CEPHALEXIN 500 MG PO CAPS: 500.0000 mg | ORAL_CAPSULE | Freq: Two times a day (BID) | ORAL | 0 refills | Status: DC

## 2017-12-23 MED ORDER — CALCIUM CARBONATE 1250 (500 CA) MG PO TABS
1.0000 | ORAL_TABLET | Freq: Every day | ORAL | Status: DC
  Administered 2017-12-24 – 2018-01-18 (×25): 500 mg via ORAL
  Filled 2017-12-23 (×26): qty 1

## 2017-12-23 MED ORDER — FLEET ENEMA 7-19 GM/118ML RE ENEM: 1.0000 | ENEMA | Freq: Once | RECTAL | Status: DC | PRN

## 2017-12-23 MED ORDER — PROCHLORPERAZINE EDISYLATE 10 MG/2ML IJ SOLN: 5.0000 mg | Freq: Four times a day (QID) | INTRAMUSCULAR | Status: DC | PRN

## 2017-12-23 NOTE — Care Management Note (Signed)
Case Management Note  Patient Details  Name: [name] MRN: [id number] Date of Birth: 11-Jan-1927  Subjective/Objective:  84 y.o. female on Xarelto for afib presenting with sudden onset HA and confusion, in setting of [place].  PTA, pt from home with family.                    Action/Plan: PT/OT recommending CIR.  Family available to assist at discharge.  Plan dc to [facility] later today, 12/23/17.  Expected Discharge Date:  12/23/17               Expected Discharge Plan:  [place]  In-House Referral:     Discharge planning Services  CM Consult  Post Acute Care Choice:  IP Rehab Choice offered to:  Adult Children, HC POA / Guardian  DME Arranged:    DME Agency:     HH Arranged:    HH Agency:     Status of Service:  Completed, signed off  If discussed at [place] of Stay Meetings, dates discussed:    Additional Comments:  Ella Bodo, RN 12/23/2017, 2:01 PM

## 2017-12-23 NOTE — Progress Notes (Signed)
Jamse Arn, MD  Physician  Physical Medicine and Rehabilitation  Consult Note  Signed  Date of Service:  12/21/2017 9:20 AM       Related encounter: ED to Hosp-Admission (Current) from 12/18/2017 in [place] NEURO/TRAUMA/SURGICAL ICU      Signed      Expand All Collapse All    Show:Clear all [x] Manual[x] Template[] Copied  Added by: [x] Love, Ivan Anchors, PA-C[x] Jamse Arn, MD  [] Hover for details      Physical Medicine and Rehabilitation Consult   Reason for Consult: Functional deficits Referring Physician: Dr. Kathyrn Sheriff   HPI: [name] is a 84 y.o. female with history of CAD, CAF-on Xarelto, pulmonary hypertension, Barrett's esophagus with GERD; who was admitted on 12/18/2017 with complaints of headache and blurry vision. History taken from chart review and daughter. CT head reviewed, showing right IVH.  Per report, large IVH filling most of right lateral ventricle with extension, developing unilateral right lateral ventricle hydrocephalus with right to left shift of septum pellucidum and generalized atrophy. CTA head was negative for aneurysm, AVM and bleed felt to be due to amyloid angiopathy v/s HTN.  Elevated BP treated with Cleviprex and patient felt to have a spontaneous bleed. Dr. Kathyrn Sheriff consulted and recommended Xarelto reversed with systolic, blood pressure goals recommended less than 140 and monitoring without need for surgical intervention. Has been neurologically stable and CIR recommended due to functional deficits.   Review of Systems  Unable to perform ROS: Mental acuity       Past Medical History:  Diagnosis Date  . Anxiety   . Arthritis   . Barrett's esophagus   . Chronic atrial fibrillation    a. Pt previously declined DCCV.  Marland Kitchen Coronary artery disease    a. s/p Cypher DES to LAD in 2006;  b. Last LHC 02/2008:  pLAD 30%, mLAD stent patent, pOM 50%, EF 65%;  c. Lexiscan Myoview 7/14:  Intermediate risk, dist ant  and inf-lat ischemia, EF 66% - patient declined cath and elected medical therapy.  Marland Kitchen GERD (gastroesophageal reflux disease)   . Hiatal hernia   . Hx of echocardiogram    Echocardiogram 08/17/12: EF 55-60%, MAC, mild MR, mild LAE, mild RVE, mild to moderate RAE, moderate TR, mildly increased pulmonary artery systolic pressures  . Hyperlipidemia   . Hypertension   . Mitral regurgitation    a. Mod by echo 10/2014.  . Obesity   . Pulmonary hypertension ([place])    a. Mod-severely elevated PA pressures by echo 10/2014.  Marland Kitchen QT prolongation    a. During 10/2014 admit, QTC 559m.  . Tricuspid regurgitation    a. Mod by echo 10/2014.  .Marland KitchenUTI (lower urinary tract infection) 03/2015         Past Surgical History:  Procedure Laterality Date  . ABDOMINAL HYSTERECTOMY    . ANKLE SURGERY    . BREAST EXCISIONAL BIOPSY Right 1970  . BREAST EXCISIONAL BIOPSY Right   . BREAST EXCISIONAL BIOPSY Left   . CESAREAN SECTION    . CORONARY ANGIOPLASTY    . ESOPHAGOGASTRODUODENOSCOPY           Family History  Problem Relation Age of Onset  . Diabetes Mother   . Heart attack Mother   . Lung cancer Father   . Thyroid disease Sister   . Thyroid disease Sister   . Hypertension Sister   . Hypertension Sister   . Hypertension Sister   . Hypertension Sister   . Hypertension Brother   .  Hypertension Brother   . Breast cancer Other 45  . Colon cancer Neg Hx   . Colon polyps Neg Hx   . Esophageal cancer Neg Hx   . Stroke Neg Hx     Social History:  Moved to [place] a year ago from [facility] : [facility]? Per reports  she has quit smoking. Her smoking use included cigarettes. She has never used smokeless tobacco. Per reports she does not drink alcohol or use drugs.        Allergies  Allergen Reactions  . Amlodipine Shortness Of Breath and Swelling    Edema in legs  . Methyldopa Shortness Of Breath and Swelling  . Actonel [Risedronate Sodium] Other (See Comments)    Caused a  lump to come up in throat  . Ciprofloxacin Hcl     hallucinations  . Hctz [Hydrochlorothiazide] Itching    Pt reports causing "itching under the skin, with no rash noted."  . Hydralazine Hcl     Unknown, per pt   . Lisinopril Cough  . Oxycodone Hcl     hallucinations  . Spironolactone     Itchiing  . Tape Itching  . Valsartan     Unknown, per pt          Medications Prior to Admission  Medication Sig Dispense Refill  . acetaminophen (TYLENOL) 500 MG tablet Take 500 mg by mouth every 6 (six) hours as needed for mild pain.    . calcium carbonate (OS-CAL) 600 MG TABS Take 600 mg by mouth daily with breakfast.     . carboxymethylcellulose (REFRESH PLUS) 0.5 % SOLN Place 1 drop into both eyes as needed (dry eyes).    . Chlorphen-Phenyleph-ASA (ALKA-SELTZER PLUS COLD PO) Take 1 tablet by mouth daily as needed (cold symptoms).    . Cholecalciferol (VITAMIN D-3) 1000 UNITS CAPS Take 1 capsule by mouth daily.    Marland Kitchen LORazepam (ATIVAN) 0.5 MG tablet Take 1 tablet (0.5 mg total) by mouth every 8 (eight) hours as needed for anxiety. 30 tablet 3  . magnesium chloride (SLOW-MAG) 64 MG TBEC SR tablet Take 1 tablet (64 mg total) by mouth 2 (two) times daily. 60 tablet 0  . metoprolol tartrate (LOPRESSOR) 25 MG tablet TAKE ONE TABLET BY MOUTH TWICE A DAY 180 tablet 2  . nitroGLYCERIN (NITROSTAT) 0.4 MG SL tablet Place 1 tablet (0.4 mg total) under the tongue every 5 (five) minutes as needed for chest pain (3 doses only). 25 tablet 4  . omeprazole (PRILOSEC) 20 MG capsule Take 20 mg by mouth daily.    Marland Kitchen oxybutynin (DITROPAN) 5 MG tablet Take 5 mg by mouth daily.    . potassium chloride SA (K-DUR,KLOR-CON) 20 MEQ tablet Take 1 tablet by mouth daily.    Marland Kitchen telmisartan (MICARDIS) 20 MG tablet Take 1 tablet by mouth daily.    . vitamin B-12 (CYANOCOBALAMIN) 1000 MCG tablet Take 1,000 mcg by mouth daily.    . vitamin C (ASCORBIC ACID) 500 MG tablet Take 500 mg by  mouth daily.    . vitamin E 400 UNIT capsule Take 400 Units by mouth daily.      Home: Home Living Family/patient expects to be discharged to:: Private residence Living Arrangements: Children Additional Comments: no family present at this time, patient non reliable historian  Functional History: Prior Function Comments: Pt unable to provide information and no family present Functional Status:  Mobility: Bed Mobility Overal bed mobility: Needs Assistance Bed Mobility: Rolling, Supine to Sit Rolling: Max assist, +2 for  physical assistance Supine to sit: Max assist, +2 for physical assistance General bed mobility comments: max assisst to intiate movement and elevate trunk to upright at EOB Transfers Overall transfer level: Needs assistance Equipment used: 2 person hand held assist(face to face ) Transfers: Sit to/from Stand, Stand Pivot Transfers Sit to Stand: Max assist, +2 physical assistance Stand pivot transfers: Max assist, +2 physical assistance General transfer comment: Max to total assist for transfers due to patients poor spatial awareness and proprioception. Patient also with heavy inadvertant resistance in opposition to the left (pushers like). (unable to reach upright) Ambulation/Gait General Gait Details: NT  ADL: ADL Overall ADL's : Needs assistance/impaired Eating/Feeding: Moderate assistance, Sitting Grooming: Maximal assistance, Sitting Grooming Details (indicate cue type and reason): Presenting with poor coorindation and proprioception during hadn hygiene. Requiring Max A to bring hands towards at midline and then rub hands together. When asking pt to bring her hands together, pt reaching her right hand to her left elbow and rubbing. Upper Body Bathing: Maximal assistance, Sitting Lower Body Bathing: Maximal assistance, +2 for physical assistance, Sit to/from stand Upper Body Dressing : Maximal assistance, Sitting Lower Body Dressing: Total assistance, Sit  to/from stand Toilet Transfer: Maximal assistance, +2 for physical assistance, Stand-pivot, BSC Toileting- Clothing Manipulation and Hygiene: Maximal assistance, +2 for physical assistance Toileting - Clothing Manipulation Details (indicate cue type and reason): Max A +2 to stand, maintain standing, and perform peri care with second person. Pt able to perform peri care while sitting in recliner with back of chair reclined back. Pt perseverating on cleanign peri care.  Functional mobility during ADLs: Maximal assistance, +2 for physical assistance(stand pivot only) General ADL Comments: Pt requiring Max A +2 for ADLs and functional mobility due to poor strength, balance, cognition, and significant pushing left.   Cognition: Cognition Overall Cognitive Status: Impaired/Different from baseline Orientation Level: Oriented to person Cognition Arousal/Alertness: Awake/alert Behavior During Therapy: Restless, Impulsive Overall Cognitive Status: Impaired/Different from baseline Area of Impairment: Orientation, Attention, Memory, Following commands, Safety/judgement, Awareness, Problem solving Orientation Level: Person Current Attention Level: Sustained Memory: Decreased short-term memory, Decreased recall of precautions Following Commands: Follows one step commands inconsistently, Follows one step commands with increased time Safety/Judgement: Decreased awareness of safety, Decreased awareness of deficits Awareness: Intellectual Problem Solving: Slow processing, Decreased initiation, Difficulty sequencing, Requires verbal cues, Requires tactile cues General Comments: Pt reporting that she is at home with her poodle. Poor following of cues and requiring Max tactile, verbal, and visual cues for ADLs and safety. Pt with poor awareness of deficits and safety satating "I could have walked to the bathroom" when she required Max A +2 for sit<>stand transfer at [facility]   Blood pressure (!) 134/59, pulse 97,  temperature 98.7 F (37.1 C), temperature source Axillary, resp. rate 15, weight 76.4 kg, SpO2 97 %. Physical Exam  Nursing note and vitals reviewed. Constitutional: She appears well-developed. She appears lethargic. No distress.  Frail appearing elderly female who was barely able to open eyes  HENT:  Head: Normocephalic and atraumatic.  Eyes: Right eye exhibits no discharge. Left eye exhibits no discharge.  Only briefly opens eyes  Neck: Normal range of motion. Neck supple.  Cardiovascular:  Irregularly irregular  Respiratory: Effort normal and breath sounds normal.  GI: Soft. Bowel sounds are normal.  Musculoskeletal:  Min edema LUE with diffuse ecchymosis.  RUE edema  Neurological: She appears lethargic.  Oriented x2.  Slurred speech. Motor: RUE: 4-/5 proximal to distal RLE: 4/5 proximal to distal LUE: 0/5  proximal to distal LLE: 3-/5 proximal to distal  Skin: Skin is warm and dry. She is not diaphoretic.  See above  Psychiatric: Her affect is blunt. Her speech is delayed and slurred. She is slowed. Cognition and memory are impaired. She expresses inappropriate judgment.          Assessment/Plan: Diagnosis: Right IVH Labs and images (see above) independently reviewed.  Records reviewed and summated above.  1. Does the need for close, 24 hr/day medical supervision in concert with the patient's rehab needs make it unreasonable for this patient to be served in a less intensive setting? Yes  2. Co-Morbidities requiring supervision/potential complications: CAD (cont meds), CAF(resume Xarelto per Neurosurg), pulmonary hypertension (Monitor in accordance with increased physical activity and avoid UE resistance excercises), Barrett's esophagus with GERD (Cont meds), leukocytosis (repeat labs, cont to monitor for signs and symptoms of infection, further workup if indicated) 3. Due to bladder management, safety, skin/wound care, disease management, medication administration, pain  management and patient education, does the patient require 24 hr/day rehab nursing? Yes 4. Does the patient require coordinated care of a physician, rehab nurse, PT (1-2 hrs/day, 5 days/week), OT (1-2 hrs/day, 5 days/week) and SLP (1-2 hrs/day, 5 days/week) to address physical and functional deficits in the context of the above medical diagnosis(es)? Yes Addressing deficits in the following areas: balance, endurance, locomotion, strength, transferring, bathing, dressing, toileting, cognition, speech, language, swallowing and psychosocial support 5. Can the patient actively participate in an intensive therapy program of at least 3 hrs of therapy per day at least 5 days per week? Yes 6. The potential for patient to make measurable gains while on inpatient rehab is excellent 7. Anticipated functional outcomes upon discharge from inpatient rehab are min assist and mod assist  with PT, min assist and mod assist with OT, min assist with SLP. 8. Estimated rehab length of stay to reach the above functional goals is: 20-23 days. 9. Anticipated D/C setting: Home 10. Anticipated post D/C treatments: HH therapy and Home excercise program 11. Overall Rehab/Functional Prognosis: good  RECOMMENDATIONS: This patient's condition is appropriate for continued rehabilitative care in the following setting: CIR when medically stable Patient has agreed to participate in recommended program. Potentially Note that insurance prior authorization may be required for reimbursement for recommended care.  Comment: Rehab Admissions Coordinator to follow up.   I have personally performed a face to face diagnostic evaluation, including, but not limited to relevant history and physical exam findings, of this patient and developed relevant assessment and plan.  Additionally, I have reviewed and concur with the physician assistant's documentation above.   Delice Lesch, MD, ABPMR Bary Leriche, PA-C 12/21/2017          Revision History                   Routing History

## 2017-12-23 NOTE — Progress Notes (Signed)
Resident arrived to unit accompanied by daughter. Foley cath not in place, resident toiled during shift.

## 2017-12-23 NOTE — Discharge Summary (Signed)
Physician Discharge Summary  Patient ID: [name] MRN: [id number] DOB/AGE: 02-22-1926 84 y.o.  Admit date: 12/18/2017 Discharge date: 12/23/2017  Admission Diagnoses:  ICH  Discharge Diagnoses:  Same Active Problems:   Intracranial hemorrhage (HCC)   Intraventricular hemorrhage (HCC)   Urinary tract infection without hematuria   Coronary artery disease involving native coronary artery of native heart without angina pectoris   Pulmonary HTN (HCC)   Leukocytosis  Discharged Condition: Stable  Hospital Course:  [name] is a 84 y.o. female Who presented to the emergency room on December 18, 2017 with acute onset headache.  She was found to have a spontaneous intraventricular hemorrhage without hydrocephalus.    She was on Xarelto for Afib which was presumed to be the cause.  This was discontinued.  KCentra protocol was initiated.  At the time of evaluation she was essentially neurologically intact with the exception of mild left-sided weakness.  If determine because of her neuro status that we would hold any acute intervention like EVD.  She was admitted for observation  And possibly EVD should hydrocephalus developed.    She failed a swallow screen so she was placed on IV medications.  Her BP was kept below 140 with cleviprex. She had an uncomplicated hospital stay.  She did not develop signs of hydrocephalus.  She worked with PT/OT who recommended inpatient rehab.   She passed her swallow test that was able to tolerate p.o. Medications.  Coincidentally she was also found to have a UTI, E coli positive.  She was started on ceftriaxone IV.  Transitioned over to Keflex po.  Will need to complete a 7 day course of antibiotics for her UTI.   she will need to remain off all anticoagulants.  Recommend keeping her  Systolic blood pressure below 409. We will plan on following up in 3 weeks post rehab.  Treatments: Surgery - none  Discharge Exam: Blood pressure 131/83, pulse  66, temperature 98 F (36.7 C), temperature source Axillary, resp. rate (!) 9, weight 76.4 kg, SpO2 96 %. Awake, alert, oriented Speech fluent, appropriate CN grossly intact  moves all extremities well, right greater than left.  Disposition: Discharge disposition: [facility] Not Defined       Discharge Instructions    Call MD for:  difficulty breathing, headache or visual disturbances   Complete by:  As directed    Call MD for:  persistant dizziness or light-headedness   Complete by:  As directed    Call MD for:  severe uncontrolled pain   Complete by:  As directed    Increase activity slowly   Complete by:  As directed      Allergies as of 12/23/2017      Reactions   Amlodipine Shortness Of Breath, Swelling   Edema in legs   Methyldopa Shortness Of Breath, Swelling   Actonel [risedronate Sodium] Other (See Comments)   Caused a lump to come up in throat   Ciprofloxacin Hcl    hallucinations   Hctz [hydrochlorothiazide] Itching   Pt reports causing "itching under the skin, with no rash noted."   Hydralazine Hcl    Unknown, per pt   Lisinopril Cough   Oxycodone Hcl    hallucinations   Spironolactone    Itchiing   Tape Itching   Valsartan    Unknown, per pt      Medication List    TAKE these medications   acetaminophen 500 MG tablet Commonly known as:  TYLENOL Take 500 mg by mouth every 6 (six) hours as needed for mild pain.   ALKA-SELTZER PLUS COLD PO Take 1 tablet by mouth daily as needed (cold symptoms).   calcium carbonate 600 MG Tabs tablet Commonly known as:  OS-CAL Take 600 mg by mouth daily with breakfast.   carboxymethylcellulose 0.5 % Soln Commonly known as:  REFRESH PLUS Place 1 drop into both eyes as needed (dry eyes).   levETIRAcetam 500 MG tablet Commonly known as:  KEPPRA Take 1 tablet (500 mg total) by mouth 2 (two) times daily.   LORazepam 0.5 MG tablet Commonly known as:  ATIVAN Take 1 tablet (0.5 mg total)  by mouth every 8 (eight) hours as needed for anxiety.   magnesium chloride 64 MG Tbec SR tablet Commonly known as:  SLOW-MAG Take 1 tablet (64 mg total) by mouth 2 (two) times daily.   metoprolol tartrate 25 MG tablet Commonly known as:  LOPRESSOR TAKE ONE TABLET BY MOUTH TWICE A DAY   nitroGLYCERIN 0.4 MG SL tablet Commonly known as:  NITROSTAT Place 1 tablet (0.4 mg total) under the tongue every 5 (five) minutes as needed for chest pain (3 doses only).   omeprazole 20 MG capsule Commonly known as:  PRILOSEC Take 20 mg by mouth daily.   oxybutynin 5 MG tablet Commonly known as:  DITROPAN Take 5 mg by mouth daily.   potassium chloride SA 20 MEQ tablet Commonly known as:  K-DUR,KLOR-CON Take 1 tablet by mouth daily.   telmisartan 20 MG tablet Commonly known as:  MICARDIS Take 1 tablet by mouth daily.   vitamin B-12 1000 MCG tablet Commonly known as:  CYANOCOBALAMIN Take 1,000 mcg by mouth daily.   vitamin C 500 MG tablet Commonly known as:  ASCORBIC ACID Take 500 mg by mouth daily.   Vitamin D-3 25 MCG (1000 UT) Caps Take 1 capsule by mouth daily.   vitamin E 400 UNIT capsule Take 400 Units by mouth daily.      Follow-up Information    Lisbeth Renshaw, MD. Schedule an appointment as soon as possible for a visit in 3 week(s).   Specialty:  Neurosurgery Contact information: [address] [place] [address] [phone]           Signed: Alyson Ingles 12/23/2017, 11:21 AM

## 2017-12-23 NOTE — H&P (Signed)
Physical Medicine and Rehabilitation Admission H&P    Chief Complaint  Patient presents with  .  Functional deficits due to stroke    HPI: [name]. [name] is an 84 year old female with history of dementia, CAF- on Xarelto, pulmonary hypertension,  Barrett's esophagus; who was admitted on 12/18/2017 with complaints of headache and blurry vision.  History taken from chart review. CT of head reviewed, showing intraventricular hemorrhage.  Per report, large intraventricular hemorrhage filling most of the right ventricle with extension, development unilateral right lateral ventricle hydrocephalus with right-to-left shift of symptomatology and generalized atrophy.  CTA head was negative for aneurysm or AVM and bleed felt to be due to amyloid angiopathy versus hypertension.  Elevated blood pressures was treated with Cleviprex and neurosurgery felt that patient with spontaneous bleed.  Xarelto was reversed and Dr. Kathyrn Sheriff recommended  systolic blood pressure goals of less than 160 and felt that there was no need for surgical intervention.  Mentation is improved with improvement in activity.  Diet has been advanced to dysphagia 2.  She was found to have leukocytosis due to E. coli UTI and was started on  Ceftriaxone.  She has had issues with ssues with urinary retention  requiring in and out catheterizations.  Cleviprex has been weaned off but blood pressures remain labile requiring PRN labetalol.  Patient with resultant visual field deficits with gaze preference, visual hallucinations, difficulty keeping eyes open,  pusher tendencies and left bias with standing.  CIR recommended due to functional deficits   Review of Systems  Unable to perform ROS: Mental acuity      Past Medical History:  Diagnosis Date  . Anxiety   . Arthritis   . Barrett's esophagus   . Chronic atrial fibrillation    a. Pt previously declined DCCV.  Marland Kitchen Coronary artery disease    a. s/p Cypher DES to LAD in 2006;  b.  Last LHC 02/2008:  pLAD 30%, mLAD stent patent, pOM 50%, EF 65%;  c. Lexiscan Myoview 7/14:  Intermediate risk, dist ant and inf-lat ischemia, EF 66% - patient declined cath and elected medical therapy.  Marland Kitchen GERD (gastroesophageal reflux disease)   . Hiatal hernia   . Hx of echocardiogram    Echocardiogram 08/17/12: EF 55-60%, MAC, mild MR, mild LAE, mild RVE, mild to moderate RAE, moderate TR, mildly increased pulmonary artery systolic pressures  . Hyperlipidemia   . Hypertension   . Mitral regurgitation    a. Mod by echo 10/2014.  . Obesity   . Pulmonary hypertension ([place])    a. Mod-severely elevated PA pressures by echo 10/2014.  Marland Kitchen QT prolongation    a. During 10/2014 admit, QTC 572m.  . Tricuspid regurgitation    a. Mod by echo 10/2014.  .Marland KitchenUTI (lower urinary tract infection) 03/2015    Past Surgical History:  Procedure Laterality Date  . ABDOMINAL HYSTERECTOMY    . ANKLE SURGERY    . BREAST EXCISIONAL BIOPSY Right 1970  . BREAST EXCISIONAL BIOPSY Right   . BREAST EXCISIONAL BIOPSY Left   . CESAREAN SECTION    . CORONARY ANGIOPLASTY    . ESOPHAGOGASTRODUODENOSCOPY      Family History  Problem Relation Age of Onset  . Diabetes Mother   . Heart attack Mother   . Lung cancer Father   . Thyroid disease Sister   . Thyroid disease Sister   . Hypertension Sister   . Hypertension Sister   . Hypertension Sister   . Hypertension Sister   .  Hypertension Brother   . Hypertension Brother   . Breast cancer Other 45  . Colon cancer Neg Hx   . Colon polyps Neg Hx   . Esophageal cancer Neg Hx   . Stroke Neg Hx     Social History:  reports that she has quit smoking. Her smoking use included cigarettes. She has never used smokeless tobacco. She reports that she does not drink alcohol or use drugs.   Allergies  Allergen Reactions  . Amlodipine Shortness Of Breath and Swelling    Edema in legs  . Methyldopa Shortness Of Breath and Swelling  . Actonel [Risedronate Sodium] Other (See  Comments)    Caused a lump to come up in throat  . Ciprofloxacin Hcl     hallucinations  . Hctz [Hydrochlorothiazide] Itching    Pt reports causing "itching under the skin, with no rash noted."  . Hydralazine Hcl     Unknown, per pt   . Lisinopril Cough  . Oxycodone Hcl     hallucinations  . Spironolactone     Itchiing  . Tape Itching  . Valsartan     Unknown, per pt    Medications Prior to Admission  Medication Sig Dispense Refill  . acetaminophen (TYLENOL) 500 MG tablet Take 500 mg by mouth every 6 (six) hours as needed for mild pain.    . calcium carbonate (OS-CAL) 600 MG TABS Take 600 mg by mouth daily with breakfast.     . carboxymethylcellulose (REFRESH PLUS) 0.5 % SOLN Place 1 drop into both eyes as needed (dry eyes).    . Chlorphen-Phenyleph-ASA (ALKA-SELTZER PLUS COLD PO) Take 1 tablet by mouth daily as needed (cold symptoms).    . Cholecalciferol (VITAMIN D-3) 1000 UNITS CAPS Take 1 capsule by mouth daily.    Marland Kitchen LORazepam (ATIVAN) 0.5 MG tablet Take 1 tablet (0.5 mg total) by mouth every 8 (eight) hours as needed for anxiety. 30 tablet 3  . magnesium chloride (SLOW-MAG) 64 MG TBEC SR tablet Take 1 tablet (64 mg total) by mouth 2 (two) times daily. 60 tablet 0  . metoprolol tartrate (LOPRESSOR) 25 MG tablet TAKE ONE TABLET BY MOUTH TWICE A DAY 180 tablet 2  . nitroGLYCERIN (NITROSTAT) 0.4 MG SL tablet Place 1 tablet (0.4 mg total) under the tongue every 5 (five) minutes as needed for chest pain (3 doses only). 25 tablet 4  . omeprazole (PRILOSEC) 20 MG capsule Take 20 mg by mouth daily.    Marland Kitchen oxybutynin (DITROPAN) 5 MG tablet Take 5 mg by mouth daily.    . potassium chloride SA (K-DUR,KLOR-CON) 20 MEQ tablet Take 1 tablet by mouth daily.    Marland Kitchen telmisartan (MICARDIS) 20 MG tablet Take 1 tablet by mouth daily.    . vitamin B-12 (CYANOCOBALAMIN) 1000 MCG tablet Take 1,000 mcg by mouth daily.    . vitamin C (ASCORBIC ACID) 500 MG tablet Take 500 mg by mouth daily.    . vitamin E  400 UNIT capsule Take 400 Units by mouth daily.      Drug Regimen Review  Drug regimen was reviewed and remains appropriate with no significant issues identified  Home: Home Living Family/patient expects to be discharged to:: Private residence Living Arrangements: Children Available Help at Discharge: Family, Available 24 hours/day, Friend(s) Type of Home: House Additional Comments: no family present at this time, patient non reliable historian  Lives With: Daughter   Functional History: Prior Function Comments: Pt unable to provide information and no family present  Functional Status:  Mobility: Bed Mobility Overal bed mobility: Needs Assistance Bed Mobility: Supine to Sit Rolling: Mod assist, +2 for physical assistance Supine to sit: +2 for safety/equipment, HOB elevated, Max assist General bed mobility comments: max assist to shift trunk to midline and elevate trunk from surface, cues for sequence with pt able to move LLE off of bed without assist, RLE hanging semi off bed on arrival Transfers Overall transfer level: Needs assistance Equipment used: 2 person hand held assist Transfers: Sit to/from Stand, Stand Pivot Transfers Sit to Stand: Mod assist, +2 physical assistance Stand pivot transfers: Max assist, +2 physical assistance General transfer comment: mod assist to rise from elevated bed with 2 person assist with 3 musketeer sequence with assist for anterior translation and rise. Pivot grossly 2' side stepping toward right then turning to chair to right with bil UE support on therapist shoulders. Cues and facilitation for trunk and hip extension. Pt positioned with wedge and pillows in chair Ambulation/Gait General Gait Details: unable    ADL: ADL Overall ADL's : Needs assistance/impaired Eating/Feeding: Minimal assistance, Sitting Eating/Feeding Details (indicate cue type and reason): Family present. Pt requires mod tactile cues to find utensils located on L side;  taking appropriate bite size; educated family on need to limit distractions during meals and to sit toward L side and cue pt to look L and find objects n tray in L field as needed. Family verbalized understnading. Used bolster adn pillow on L side to improve upright midline positioning for feeding. Grooming: Wash/dry face, Set up Grooming Details (indicate cue type and reason): Presenting with poor coorindation and proprioception during hadn hygiene. Requiring Max A to bring hands towards at midline and then rub hands together. When asking pt to bring her hands together, pt reaching her right hand to her left elbow and rubbing. Upper Body Bathing: Maximal assistance, Sitting Lower Body Bathing: Maximal assistance Lower Body Bathing Details (indicate cue type and reason): pt washing periarea due to incontinence Upper Body Dressing : Maximal assistance, Sitting Lower Body Dressing: Total assistance, Sit to/from stand Toilet Transfer: Maximal assistance, +2 for physical assistance, Stand-pivot, BSC Toileting- Clothing Manipulation and Hygiene: Maximal assistance, +2 for physical assistance Toileting - Clothing Manipulation Details (indicate cue type and reason): Max A +2 to stand, maintain standing, and perform peri care with second person. Pt able to perform peri care while sitting in recliner with back of chair reclined back. Pt perseverating on cleanign peri care.  Functional mobility during ADLs: Maximal assistance, +2 for physical assistance General ADL Comments: used over the shoudler technique to facilitaet weight shifts and steps to chair  Cognition: Cognition Overall Cognitive Status: Impaired/Different from baseline Orientation Level: Oriented to person, Oriented to place, Oriented to situation, Disoriented to time Attention: Sustained Sustained Attention: Impaired Sustained Attention Impairment: Verbal basic, Functional basic Memory: Impaired Memory Impairment: Retrieval deficit,  Decreased recall of new information, Decreased short term memory Decreased Short Term Memory: Verbal basic, Functional basic Awareness: Impaired Awareness Impairment: Anticipatory impairment, Intellectual impairment Problem Solving: Impaired Problem Solving Impairment: Verbal basic, Functional basic Executive Function: Writer: Impaired Organizing Impairment: Verbal basic, Functional basic Behaviors: Impulsive Safety/Judgment: Impaired Cognition Arousal/Alertness: Awake/alert Behavior During Therapy: Restless, Impulsive Overall Cognitive Status: Impaired/Different from baseline Area of Impairment: Orientation, Attention, Memory, Following commands, Safety/judgement, Awareness, Problem solving Orientation Level: Disoriented to Current Attention Level: Sustained Memory: Decreased short-term memory Following Commands: Follows one step commands inconsistently Safety/Judgement: Decreased awareness of safety, Decreased awareness of deficits Awareness: Intellectual Problem Solving: Slow  processing, Decreased initiation, Difficulty sequencing, Requires verbal cues, Requires tactile cues General Comments: pt without hallucination today, left inattention, left bias   Blood pressure (!) 145/71, pulse 80, temperature 98 F (36.7 C), temperature source Oral, resp. rate 14, weight 76.4 kg, SpO2 96 %. Physical Exam  Nursing note and vitals reviewed. Constitutional: She appears well-developed and well-nourished. She appears lethargic. She is sleeping. She is easily aroused.  Elderly female slumped across to the left with left gaze preference. Kept eyes closed due to "headache"  HENT:  Head: Normocephalic and atraumatic.  Eyes: Pupils are equal, round, and reactive to light. Conjunctivae are normal.  Needs tactile cues to open eyes. Kept eyes closed as head "felt better that way"  Neck:  Kept head turned to the left and expressed discomfort with attempts to bring head to midline.     Cardiovascular:  Irregularly irregular  Respiratory: Effort normal and breath sounds normal.  GI: Soft. Bowel sounds are normal.  Genitourinary:  Genitourinary Comments: Purewick in place.   Musculoskeletal:  Pain with attempts at ranging RLE.   Edema most significant in right upper extremity  Neurological: She is easily aroused. She appears lethargic.  Somnolent Needed tactile cues to keep eyes open and interact. Oriented to person and place  BUE tremors noted.  Slurred speech. Motor: Limited due to participation RUE: 4/5 proximal to distal RLE: 4/5 proximal to distal LUE: 0/5 proximal to distal LLE: 3-/5 proximal to distal   Skin: Skin is warm and dry.  BUE with diffuse ecchymotic areas.   Psychiatric:  Unable to assess due to mentation    No results found for this or any previous visit (from the past 48 hour(s)). No results found.     Medical Problem List and Plan: 1.  Visual field deficits with gaze preference, visual hallucinations, difficulty keeping eyes open,  pusher tendencies and left bias with standing secondary to large intraventricular hemorrhage  2.  DVT Prophylaxis/Anticoagulation: Mechanical: Sequential compression devices, below knee Bilateral lower extremities 3. Headaches/Pain Management: Limit sedating medications. Will start Topamax at bedtime and limit hydrocodone as needed for severe HA.  4. Mood: LCSW to follow for evaluation and support when appropriate. Resume home ativan prn at lower dose.  5. Neuropsych: This patient is not capable of making decisions on her own behalf 6. Skin/Wound Care: Routine pressure relief measures 7. Fluids/Electrolytes/Nutrition: Monitor I's and O's.  Check labs in a.m. 8.  E. coli UTI: Change ceftriaxone to amoxicillin--day number 6/7 antibiotic regimen 9.  HTN: Blood pressure goals less than 160/90.  Continue metoprolol twice daily and increase Avapro to 150 mg for blood pressure control. 10.  Seizure prophylaxis:  Continue Keppra twice daily. 11.  Urinary retention: Discontinue Ditropan.  Continue Flomax routine pressure relief measures. 12.  Leukocytosis/hyponatremia: Will check follow-up labs in a.m. 13. H/o Barrett's esophagus/GERD: On Protonix   Post Admission Physician Evaluation: 1. Preadmission assessment reviewed and changes made below. 2. Functional deficits secondary  to large intraventricular hemorrhage.  3. Patient is admitted to receive collaborative, interdisciplinary care between the physiatrist, rehab nursing staff, and therapy team. 4. Patient's level of medical complexity and substantial therapy needs in context of that medical necessity cannot be provided at a lesser intensity of care such as a SNF. 5. Patient has experienced substantial functional loss from his/her baseline which was documented above under the "Functional History" and "Functional Status" headings.  Judging by the patient's diagnosis, physical exam, and functional history, the patient has potential for functional progress  which will result in measurable gains while on inpatient rehab.  These gains will be of substantial and practical use upon discharge  in facilitating mobility and self-care at the household level. 6. Physiatrist will provide 24 hour management of medical needs as well as oversight of the therapy plan/treatment and provide guidance as appropriate regarding the interaction of the two. 7. 24 hour rehab nursing will assist with bladder management, bowel management, safety, skin/wound care, disease management, medication administration, pain management and patient education  and help integrate therapy concepts, techniques,education, etc. 8. PT will assess and treat for/with: Lower extremity strength, range of motion, stamina, balance, functional mobility, safety, adaptive techniques and equipment, coping skills, pain control, education. Goals are: Min/Mod A. 9. OT will assess and treat for/with: ADL's, functional  mobility, safety, upper extremity strength, adaptive techniques and equipment, wound mgt, ego support, and community reintegration.   Goals are: Min/Mod A. Therapy may proceed with showering this patient. 10. SLP will assess and treat for/with: Speech, language, cognition, swallowing.  Goals are: Min/Mod A. 11. Case Management and Social Worker will assess and treat for psychological issues and discharge planning. 12. Team conference will be held weekly to assess progress toward goals and to determine barriers to discharge. 13. Patient will receive at least 3 hours of therapy per day at least 5 days per week. 14. ELOS: 20-25 days.       15. Prognosis:  good  I have personally performed a face to face diagnostic evaluation, including, but not limited to relevant history and physical exam findings, of this patient and developed relevant assessment and plan.  Additionally, I have reviewed and concur with the physician assistant's documentation above.  The patient's status has not changed. The original post admission physician evaluation remains appropriate, and any changes from the pre-admission screening or documentation from the acute chart are noted above.    Delice Lesch, MD, ABPMR Bary Leriche, PA-C 12/23/2017

## 2017-12-23 NOTE — Progress Notes (Signed)
IP rehab admissions - I spoke with patient's daughter this am.  She is interested in CIR.  Bed available and will admit to inpatient rehab today.  Call me for questions.  [phone]

## 2017-12-23 NOTE — Progress Notes (Signed)
Retta Diones, RN  Rehab Admission Coordinator  Physical Medicine and Rehabilitation  PMR Pre-admission  Signed  Date of Service:  12/23/2017 11:52 AM       Related encounter: ED to Hosp-Admission (Current) from 12/18/2017 in [place] NEURO/TRAUMA/SURGICAL ICU      Signed         Show:Clear all _0 Manual_1 Template_2 Copied  Added by: _3 Retta Diones, RN  _4 Hover for details PMR Admission Coordinator Pre-Admission Assessment  Patient: [name] is an 84 y.o., female MRN: [id number] DOB: [birth year] Height:   Weight: 76.4 kg                                                                                                                                                  Insurance Information HMO: No    PPO:       PCP:       IPA:       80/20:       OTHER:   PRIMARY:  Medicare A and B      Policy#: [id number]      Subscriber: patient CM Name:        Phone#:       Fax#:   Pre-Cert#:        Employer:  Retired Benefits:  Phone #:       Name: Checked in [place] [employer] [place]. Date: 06/17/93     Deduct:  $1364      Out of Pocket Max:  None      Life Max: N/A CIR: 100%      SNF: 100 days Outpatient: 80%     Co-Pay: 20% Home Health: 100%      Co-Pay: none DME: 80%     Co-Pay: 20% Providers: patient's choice  SECONDARY: BCBS supplement      Policy#: [id number]      Subscriber: patient CM Name:        Phone#:       Fax#:   Pre-Cert#:        Employer: Retired Benefits:  Phone #: [phone]     Name:   Eff. Date:       Deduct:        Out of Pocket Max:        Life Max:   CIR:        SNF:   Outpatient:       Co-Pay:   Home Health:        Co-Pay:   DME:       Co-Pay:    Emergency Contact Information         Contact Information    Name Relation Home Work [employer] Daughter [phone]  [phone]   Sharlot Gowda   [phone]     Current Medical History  Patient Admitting Diagnosis: R IVH   History  of Present  Illness: An 84 y.o.femalewith history of CAD, CAF-on Xarelto,pulmonary hypertension, Barrett's esophagus with GERD;who was admitted on 12/18/2017 with complaints of headache and blurry vision.History taken from chart review and daughter.CT headreviewed, showing right IVH. Per report, large IVH filling most of right lateral ventricle with extension, developing unilateral right lateral ventricle hydrocephalus with right to left shift of septum pellucidum and generalized atrophy. CTA head was negative for aneurysm, AVM and bleed felt to be due to amyloid angiopathy v/s HTN. Elevated BP treated with Cleviprex and patient felt to have a spontaneous bleed. Dr. Kathyrn Sheriff consulted and recommended Xarelto reversed with systolic,blood pressure goals recommended less than 140and monitoring without need for surgical intervention. Has been neurologically stable and CIR recommended due to functional deficits.   Complete NIHSS TOTAL: 8  Past Medical History      Past Medical History:  Diagnosis Date  . Anxiety   . Arthritis   . Barrett's esophagus   . Chronic atrial fibrillation    a. Pt previously declined DCCV.  Marland Kitchen Coronary artery disease    a. s/p Cypher DES to LAD in 2006;  b. Last LHC 02/2008:  pLAD 30%, mLAD stent patent, pOM 50%, EF 65%;  c. Lexiscan Myoview 7/14:  Intermediate risk, dist ant and inf-lat ischemia, EF 66% - patient declined cath and elected medical therapy.  Marland Kitchen GERD (gastroesophageal reflux disease)   . Hiatal hernia   . Hx of echocardiogram    Echocardiogram 08/17/12: EF 55-60%, MAC, mild MR, mild LAE, mild RVE, mild to moderate RAE, moderate TR, mildly increased pulmonary artery systolic pressures  . Hyperlipidemia   . Hypertension   . Mitral regurgitation    a. Mod by echo 10/2014.  . Obesity   . Pulmonary hypertension ([place])    a. Mod-severely elevated PA pressures by echo 10/2014.  Marland Kitchen QT prolongation    a. During 10/2014 admit, QTC 531m.  . Tricuspid  regurgitation    a. Mod by echo 10/2014.  .Marland KitchenUTI (lower urinary tract infection) 03/2015    Family History  family history includes Breast cancer (age of onset: 478 in her other; Diabetes in her mother; Heart attack in her mother; Hypertension in her brother, brother, sister, sister, sister, and sister; Lung cancer in her father; Thyroid disease in her sister and sister.  Prior Rehab/Hospitalizations:  Has the patient had major surgery during 100 days prior to admission? No  Current Medications   Current Facility-Administered Medications:  .   stroke: mapping our early stages of recovery book, , Does not apply, Once, Costella, Vincent J, PA-C .  0.9 %  sodium chloride infusion, , Intravenous, Continuous, Costella, Vincent J, PA-C, Last Rate: 100 mL/hr at 12/23/17 1100 .  acetaminophen (TYLENOL) tablet 650 mg, 650 mg, Oral, Q4H PRN, 650 mg at 12/22/17 1543 **OR** acetaminophen (TYLENOL) solution 650 mg, 650 mg, Per Tube, Q4H PRN **OR** acetaminophen (TYLENOL) suppository 650 mg, 650 mg, Rectal, Q4H PRN, Costella, Vincent J, PA-C, 650 mg at 12/19/17 0109 .  calcium carbonate (OS-CAL - dosed in mg of elemental calcium) tablet 500 mg of elemental calcium, 1 tablet, Oral, Q breakfast, Nundkumar, NNena Polio MD, 500 mg of elemental calcium at 12/23/17 0750 .  cefTRIAXone (ROCEPHIN) 1 g in sodium chloride 0.9 % 100 mL IVPB, 1 g, Intravenous, Q24H, NConsuella Lose MD, Stopped at 12/22/17 1233 .  cholecalciferol (VITAMIN D) tablet 1,000 Units, 1,000 Units, Oral, Daily, Costella, Vincent JLenna Sciara PA-C, 1,000 Units at 12/23/17 0902 .  clevidipine (CLEVIPREX) infusion 0.5  mg/mL, 0-21 mg/hr, Intravenous, Continuous, Costella, Vista Mink, PA-C, Stopped at 12/21/17 1559 .  docusate sodium (COLACE) capsule 100 mg, 100 mg, Oral, BID, Costella, Vista Mink, PA-C, 100 mg at 12/22/17 2136 .  HYDROcodone-acetaminophen (NORCO/VICODIN) 5-325 MG per tablet 1-2 tablet, 1-2 tablet, Oral, Q6H PRN, Costella, Vista Mink, PA-C,  2 tablet at 12/23/17 0858 .  irbesartan (AVAPRO) tablet 75 mg, 75 mg, Oral, Daily, Costella, Vincent J, PA-C, 75 mg at 12/23/17 0908 .  labetalol (NORMODYNE,TRANDATE) injection 10-40 mg, 10-40 mg, Intravenous, Q10 min PRN, Costella, Vincent J, PA-C, 20 mg at 12/23/17 0901 .  levETIRAcetam (KEPPRA) IVPB 500 mg/100 mL premix, 500 mg, Intravenous, Q12H, Costella, Vista Mink, PA-C, Stopped at 12/23/17 [phone] .  magnesium chloride (SLOW-MAG) 64 MG SR tablet 64 mg, 1 tablet, Oral, BID, Costella, Vincent Lenna Sciara, PA-C, 64 mg at 12/23/17 0907 .  metoprolol tartrate (LOPRESSOR) tablet 25 mg, 25 mg, Oral, BID, Costella, Vincent J, PA-C, 25 mg at 12/23/17 0903 .  nitroGLYCERIN (NITROSTAT) SL tablet 0.4 mg, 0.4 mg, Sublingual, Q5 min PRN, Costella, Vincent J, PA-C .  ondansetron (ZOFRAN) injection 4 mg, 4 mg, Intravenous, Q6H PRN, Consuella Lose, MD, 4 mg at 12/23/17 0858 .  oxybutynin (DITROPAN) tablet 5 mg, 5 mg, Oral, Daily, Costella, Vincent J, PA-C, 5 mg at 12/23/17 0908 .  pantoprazole (PROTONIX) EC tablet 40 mg, 40 mg, Oral, Daily, Costella, Vincent J, PA-C, 40 mg at 12/23/17 0903 .  potassium chloride SA (K-DUR,KLOR-CON) CR tablet 20 mEq, 20 mEq, Oral, Daily, Costella, Vincent J, PA-C, 20 mEq at 12/23/17 0905 .  tamsulosin (FLOMAX) capsule 0.4 mg, 0.4 mg, Oral, Daily, Costella, Vincent J, PA-C, 0.4 mg at 12/23/17 0904 .  vitamin B-12 (CYANOCOBALAMIN) tablet 1,000 mcg, 1,000 mcg, Oral, Daily, Costella, Vincent J, PA-C, 1,000 mcg at 12/23/17 0908 .  vitamin C (ASCORBIC ACID) tablet 500 mg, 500 mg, Oral, Daily, Costella, Vincent J, PA-C, 500 mg at 12/23/17 0904 .  vitamin E capsule 400 Units, 400 Units, Oral, Daily, Traci Sermon, PA-C, 400 Units at 12/23/17 9528  Patients Current Diet:     Diet Order                  DIET DYS 2 Room service appropriate? Yes; Fluid consistency: Thin  Diet effective now               Precautions / Restrictions Precautions Precautions: Fall Precaution  Comments: pushing left Restrictions Weight Bearing Restrictions: No   Has the patient had 2 or more falls or a fall with injury in the past year?No  Prior Activity Level Community (5-7x/wk): Walked outside around [employer], [employer] and to [employer] daily.  Was not driving.  Home Assistive Devices / Equipment Home Assistive Devices/Equipment: Cane (specify quad or straight), Walker (specify type)  Prior Device Use: Indicate devices/aids used by the patient prior to current illness, exacerbation or injury? Walker and [employer]  Prior Functional Level Prior Function Comments: Pt unable to provide information and no family present  Self Care: Did the patient need help bathing, dressing, using the toilet or eating?  Independent  Indoor Mobility: Did the patient need assistance with walking from room to room (with or without device)? Independent  Stairs: Did the patient need assistance with internal or external stairs (with or without device)? Independent  Functional Cognition: Did the patient need help planning regular tasks such as shopping or remembering to take medications? Independent  Current Functional Level Cognition  Overall Cognitive Status: Impaired/Different from baseline Current Attention  Level: Sustained Orientation Level: Oriented to person, Oriented to place, Oriented to situation, Disoriented to time Following Commands: Follows one step commands inconsistently Safety/Judgement: Decreased awareness of safety, Decreased awareness of deficits General Comments: pt without hallucination today, left inattention, left bias Attention: Sustained Sustained Attention: Impaired Sustained Attention Impairment: Verbal basic, Functional basic Memory: Impaired Memory Impairment: Retrieval deficit, Decreased recall of new information, Decreased short term memory Decreased Short Term Memory: Verbal basic, Functional basic Awareness: Impaired Awareness Impairment: Anticipatory impairment,  Intellectual impairment Problem Solving: Impaired Problem Solving Impairment: Verbal basic, Functional basic Executive Function: Writer: Impaired Organizing Impairment: Verbal basic, Functional basic Behaviors: Impulsive Safety/Judgment: Impaired    Extremity Assessment (includes Sensation/Coordination)  Upper Extremity Assessment: LUE deficits/detail LUE Deficits / Details: generalized weakness but attempting to use L hand funcitonally with cues LUE Coordination: decreased fine motor, decreased gross motor  Lower Extremity Assessment: Defer to PT evaluation LLE Deficits / Details: noted stiffness and tone in LLE, limited overall assessment due to patient cognition LLE Sensation: decreased proprioception LLE Coordination: decreased fine motor, decreased gross motor    ADLs  Overall ADL's : Needs assistance/impaired Eating/Feeding: Minimal assistance, Sitting Eating/Feeding Details (indicate cue type and reason): Family present. Pt requires mod tactile cues to find utensils located on L side; taking appropriate bite size; educated family on need to limit distractions during meals and to sit toward L side and cue pt to look L and find objects n tray in L field as needed. Family verbalized understnading. Used bolster adn pillow on L side to improve upright midline positioning for feeding. Grooming: Wash/dry face, Set up Grooming Details (indicate cue type and reason): Presenting with poor coorindation and proprioception during hadn hygiene. Requiring Max A to bring hands towards at midline and then rub hands together. When asking pt to bring her hands together, pt reaching her right hand to her left elbow and rubbing. Upper Body Bathing: Maximal assistance, Sitting Lower Body Bathing: Maximal assistance Lower Body Bathing Details (indicate cue type and reason): pt washing periarea due to incontinence Upper Body Dressing : Maximal assistance, Sitting Lower Body Dressing:  Total assistance, Sit to/from stand Toilet Transfer: Maximal assistance, +2 for physical assistance, Stand-pivot, BSC Toileting- Clothing Manipulation and Hygiene: Maximal assistance, +2 for physical assistance Toileting - Clothing Manipulation Details (indicate cue type and reason): Max A +2 to stand, maintain standing, and perform peri care with second person. Pt able to perform peri care while sitting in recliner with back of chair reclined back. Pt perseverating on cleanign peri care.  Functional mobility during ADLs: Maximal assistance, +2 for physical assistance General ADL Comments: used over the shoudler technique to facilitaet weight shifts and steps to chair    Mobility  Overal bed mobility: Needs Assistance Bed Mobility: Supine to Sit Rolling: Mod assist, +2 for physical assistance Supine to sit: +2 for safety/equipment, HOB elevated, Max assist General bed mobility comments: max assist to shift trunk to midline and elevate trunk from surface, cues for sequence with pt able to move LLE off of bed without assist, RLE hanging semi off bed on arrival    Transfers  Overall transfer level: Needs assistance Equipment used: 2 person hand held assist Transfers: Sit to/from Stand, Stand Pivot Transfers Sit to Stand: Mod assist, +2 physical assistance Stand pivot transfers: Max assist, +2 physical assistance General transfer comment: mod assist to rise from elevated bed with 2 person assist with 3 musketeer sequence with assist for anterior translation and rise. Pivot grossly 2' side stepping  toward right then turning to chair to right with bil UE support on therapist shoulders. Cues and facilitation for trunk and hip extension. Pt positioned with wedge and pillows in chair    Ambulation / Gait / Stairs / Wheelchair Mobility  Ambulation/Gait General Gait Details: unable    Posture / Balance Dynamic Sitting Balance Sitting balance - Comments: L lateral lean;  pushing Balance Overall balance assessment: Needs assistance Sitting-balance support: Feet supported Sitting balance-Leahy Scale: Poor Sitting balance - Comments: L lateral lean; pushing Postural control: Left lateral lean Standing balance-Leahy Scale: Poor Standing balance comment: bil UE supported in standing    Special needs/care consideration BiPAP/CPAP No CPM No Continuous Drip IV No Dialysis No         Life Vest No Oxygen No Special Bed No Trach Size No Wound Vac (area) No    Skin Has a skin tear on left arm; has a corn on the bottom of right foot that is bothering patient        Bowel mgmt: Last BM 12/17/17 Bladder mgmt: External catheter Diabetic mgmt No   Previous Home Environment Living Arrangements: Children  Lives With: Daughter Available Help at Discharge: Family, Available 24 hours/day, Friend(s) Type of Home: [place]: No Additional Comments: no family present at this time, patient non reliable historian  Discharge Living Setting Plans for Discharge Living Setting: House, Lives with (comment)(Lives with daughter and son in law.) Type of Home at Discharge: House Discharge Home Layout: Two level, 1/2 bath on main level, Bed/bath upstairs, Able to live on main level with bedroom/bathroom(Patient's bedroom is upstairs, but do have BR on main level) Alternate Level Stairs-Number of Steps: 13 steps(Family considering a stair lift) Discharge Home Access: Ramped entrance(Has a ramp at the front entrance.) Discharge Bathroom Shower/Tub: Tub/shower unit, Door Discharge Bathroom Toilet: Standard Does the patient have any problems obtaining your medications?: No  Social/Family/Support Systems Patient Roles: Parent, Other (Comment)(Has a daughter, son in Sports coach and grand daughter.) Contact Information: Cleaster Corin - daughter Anticipated Caregiver: daughter Anticipated Caregiver's Contact Information: [name] - daughter -  [phone] Ability/Limitations of Caregiver: Daughter is retired and can assist. Building control surveyor Availability: 24/7 Discharge Plan Discussed with Primary Caregiver: Yes Is Caregiver In Agreement with Plan?: Yes Does Caregiver/Family have Issues with Lodging/Transportation while Pt is in Rehab?: No  Goals/Additional Needs Patient/Family Goal for Rehab: PT/OT min to mod assist, SLP min assist goals Expected length of stay: 20-23 days Cultural Considerations: None Dietary Needs: Dys 2, thin liquids Equipment Needs: TBD Pt/Family Agrees to Admission and willing to participate: Yes Program Orientation Provided & Reviewed with Pt/Caregiver Including Roles  & Responsibilities: Yes  Decrease burden of Care through IP rehab admission: N/A  Possible need for SNF placement upon discharge: Yes, if daughter does not feel that she can manage at level obtained at time of discharge from CIR  Patient Condition: This patient's medical and functional status has changed since the consult dated: 12/21/17 in which the Rehabilitation Physician determined and documented that the patient's condition is appropriate for intensive rehabilitative care in an inpatient rehabilitation facility. See "History of Present Illness" (above) for medical update. Functional changes are:  Currently requiring max assist +2 for stand pivot transfers. Patient's medical and functional status update has been discussed with the Rehabilitation physician and patient remains appropriate for inpatient rehabilitation. Will admit to inpatient rehab today.  Preadmission Screen Completed By:  Retta Diones, 12/23/2017 12:11 PM ______________________________________________________________________   Discussed status with Dr. Posey Pronto on 12/23/17  at 41 and received telephone approval for admission today.  Admission Coordinator:  Retta Diones, time 1210/Date 12/23/17           Cosigned by: Jamse Arn, MD at 12/23/2017 12:45 PM   Revision History

## 2017-12-23 NOTE — IPOC Note (Signed)
Overall Plan of Care [facility]) Patient Details Name: [name] MRN: [id number] DOB: 01/24/1927  Admitting Diagnosis: RIght IVH  Hospital Problems: Active Problems:   Intraventricular hemorrhage ([place])   ICH (intracerebral hemorrhage) ([facility])   Acute blood loss anemia   Hypoalbuminemia due to protein-calorie malnutrition [facility])     Functional Problem List: Nursing Bladder, Bowel, Endurance, Motor, Nutrition, Pain, Perception, Safety, Skin Integrity  PT Balance, Behavior, Edema, Endurance, Motor, Nutrition, Pain, Perception, Safety, Sensory, Skin Integrity  OT Balance, Nutrition, Vision, Pain, Perception, Cognition, Edema, Safety, Endurance, Sensory, Motor, Skin Integrity  SLP Cognition, Nutrition  TR         Basic ADL's: OT Eating, Grooming, Bathing, Dressing, Toileting     Advanced  ADL's: OT       Transfers: PT Bed Mobility, Bed to Chair, Furniture, Floor, Teacher, early years/pre, Metallurgist: PT Ambulation, Emergency planning/management officer, Stairs     Additional Impairments: OT None  SLP Swallowing, Social Cognition   Problem Solving, Attention, Awareness  TR      Anticipated Outcomes Item Anticipated Outcome  Self Feeding set up  Swallowing  supervision    Basic self-care  min A  Toileting  min A   Bathroom Transfers min A  Bowel/Bladder  maintain regular pattern of emptying bowel and bladder  Transfers  Min assist with LRAD   Locomotion  Ambulatory with min assist for short distances. min-supervision assist in [facility]  Communication     Cognition  min assist   Pain  less than 5  Safety/Judgment  remain free of falls, infection, and skin breakdown while on rehab   Therapy Plan: PT Intensity: Minimum of 1-2 x/day ,45 to 90 minutes PT Frequency: 5 out of 7 days PT Duration Estimated Length of Stay: 3-4 weeks  OT Intensity: Minimum of 1-2 x/day, 45 to 90 minutes OT Frequency: 5 out of 7 days OT Duration/Estimated Length of Stay: 3-4 weeks SLP Intensity:  Minumum of 1-2 x/day, 30 to 90 minutes SLP Frequency: 3 to 5 out of 7 days SLP Duration/Estimated Length of Stay: 21-28 days     Team Interventions: Nursing Interventions Patient/Family Education, Bladder Management, Bowel Management, Pain Management, Skin Care/Wound Management, Medication Management, Cognitive Remediation/Compensation, Dysphagia/Aspiration Precaution Training, Psychosocial Support  PT interventions Ambulation/gait training, Community reintegration, DME/adaptive equipment instruction, Neuromuscular re-education, Psychosocial support, Stair training, UE/LE Strength taining/ROM, Wheelchair propulsion/positioning, Therapeutic Activities, UE/LE Coordination activities, Skin care/wound management, Pain management, Functional electrical stimulation, Discharge planning, Training and development officer, Cognitive remediation/compensation, Disease management/prevention, Functional mobility training, Patient/family education, Splinting/orthotics, Therapeutic Exercise, Visual/perceptual remediation/compensation  OT Interventions Training and development officer, Community reintegration, Disease mangement/prevention, Functional electrical stimulation, Neuromuscular re-education, Patient/family education, Self Care/advanced ADL retraining, Therapeutic Exercise, UE/LE Coordination activities, Wheelchair propulsion/positioning, Cognitive remediation/compensation, Discharge planning, DME/adaptive equipment instruction, Functional mobility training, Pain management, Psychosocial support, Skin care/wound managment, Therapeutic Activities, UE/LE Strength taining/ROM, Visual/perceptual remediation/compensation, Splinting/orthotics  SLP Interventions Cognitive remediation/compensation, Cueing hierarchy, Environmental controls, Functional tasks, Internal/external aids, Patient/family education  TR Interventions    SW/CM Interventions Discharge Planning, Psychosocial Support, Patient/Family Education   Barriers to  Discharge MD  Medical stability  Nursing      PT Inaccessible home environment, Medical stability, Decreased caregiver support, Home environment access/layout, Incontinence, Lack of/limited family support    OT Inaccessible home environment, Decreased caregiver support    SLP      SW       Team Discharge Planning: Destination: PT-Home ,OT- Home , SLP-Home Projected Follow-up: PT-Home health PT, OT-  Home health  OT, SLP-Home Health SLP, 24 hour supervision/assistance Projected Equipment Needs: PT-Wheelchair cushion (measurements), Wheelchair (measurements), To be determined, Rolling walker with 5" wheels, OT- To be determined, SLP-None recommended by SLP Equipment Details: PT- , OT-  Patient/family involved in discharge planning: PT- Patient,  OT-Patient, SLP-Patient unable/family or caregive not available  MD ELOS: 20-24 days. Medical Rehab Prognosis:  Good Assessment: 84 year old female with history of dementia, CAF- on Xarelto, pulmonary hypertension,  Barrett's esophagus; who was admitted on 12/18/2017 with complaints of headache and blurry vision.  CT of head reviewed, showing intraventricular hemorrhage.  Per report, large intraventricular hemorrhage filling most of the right ventricle with extension, development unilateral right lateral ventricle hydrocephalus with right-to-left shift of symptomatology and generalized atrophy.  CTA head was negative for aneurysm or AVM and bleed felt to be due to amyloid angiopathy versus hypertension.  Elevated blood pressures was treated with Cleviprex and neurosurgery felt that patient with spontaneous bleed.  Xarelto was reversed and Dr. Kathyrn Sheriff recommended  systolic blood pressure goals of less than 160 and felt that there was no need for surgical intervention.  Mentation is improved with improvement in activity.  Diet has been advanced to dysphagia 2.  She was found to have leukocytosis due to E. coli UTI and was started on  Ceftriaxone.  She  has had issues with ssues with urinary retention  requiring in and out catheterizations.  Cleviprex has been weaned off but blood pressures remain labile requiring PRN labetalol.  Patient with resultant visual field deficits with gaze preference, visual hallucinations, difficulty keeping eyes open,  pusher tendencies and left bias with standing.  We will set goals for Min A with PT/OT/SLP.  See Team Conference Notes for weekly updates to the plan of care

## 2017-12-23 NOTE — Progress Notes (Signed)
  NEUROSURGERY PROGRESS NOTE   No issues overnight. Complains of headache  EXAM:  BP (!) 171/110 Comment: labetalol given  Pulse 77   Temp 98 F (36.7 C) (Axillary)   Resp (!) 9   Wt 76.4 kg   SpO2 95%   BMI 28.03 kg/m   Awake, alert Speech fluent, appropriate  MAEW with good strength  IMPRESSION/PLAN 84 y.o. female females/p spontaneous right IVH. She remains neurologically intact. No signs of hydrocephalus. - Continue current care, BP <160 - Cleared for d/c to CIR when bed available

## 2017-12-23 NOTE — PMR Pre-admission (Signed)
PMR Admission Coordinator Pre-Admission Assessment  Patient: [name] is an 84 y.o., female MRN: [id number] DOB: June 08, 1926 Height:   Weight: 76.4 kg              Insurance Information HMO: No    PPO:       PCP:       IPA:       80/20:       OTHER:   PRIMARY:  Medicare A and B      Policy#: [id number]      Subscriber: patient CM Name:        Phone#:       Fax#:   Pre-Cert#:        Employer:  Retired Benefits:  Phone #:       Name: Checked in [employer] Date: 06/17/93     Deduct:  $1364      Out of Pocket Max:  None      Life Max: N/A CIR: 100%      SNF: 100 days Outpatient: 80%     Co-Pay: 20% Home Health: 100%      Co-Pay: none DME: 80%     Co-Pay: 20% Providers: patient's choice  SECONDARY: BCBS supplement      Policy#: [id number]      Subscriber: patient CM Name:        Phone#:       Fax#:   Pre-Cert#:        Employer: Retired Benefits:  Phone #: [phone]     Name:   Eff. Date:       Deduct:        Out of Pocket Max:        Life Max:   CIR:        SNF:   Outpatient:       Co-Pay:   Home Health:        Co-Pay:   DME:       Co-Pay:    Emergency Contact Information Contact Information    Name Relation Home Work [place] Daughter [phone]  [phone]   Sharlot Gowda   [phone]     Current Medical History  Patient Admitting Diagnosis: R IVH   History of Present Illness: An 84 y.o. female with history of CAD, CAF-on Xarelto, pulmonary hypertension, Barrett's esophagus with GERD; who was admitted on 12/18/2017 with complaints of headache and blurry vision. History taken from chart review and daughter. CT head reviewed, showing right IVH.  Per report, large IVH filling most of right lateral ventricle with extension, developing unilateral right lateral ventricle hydrocephalus with right to left shift of septum pellucidum and generalized atrophy. CTA head was negative for aneurysm, AVM and bleed felt to be due to  amyloid angiopathy v/s HTN.  Elevated BP treated with Cleviprex and patient felt to have a spontaneous bleed. Dr. Kathyrn Sheriff consulted and recommended Xarelto reversed with systolic, blood pressure goals recommended less than 140 and monitoring without need for surgical intervention. Has been neurologically stable and CIR recommended due to functional deficits.    Complete NIHSS TOTAL: 8  Past Medical History  Past Medical History:  Diagnosis Date  . Anxiety   . Arthritis   . Barrett's esophagus   . Chronic atrial fibrillation    a. Pt previously declined DCCV.  Marland Kitchen Coronary artery disease    a. s/p Cypher DES to LAD in 2006;  b. Last LHC 02/2008:  pLAD 30%, mLAD stent patent, pOM  50%, EF 65%;  c. Lexiscan Myoview 7/14:  Intermediate risk, dist ant and inf-lat ischemia, EF 66% - patient declined cath and elected medical therapy.  Marland Kitchen GERD (gastroesophageal reflux disease)   . Hiatal hernia   . Hx of echocardiogram    Echocardiogram 08/17/12: EF 55-60%, MAC, mild MR, mild LAE, mild RVE, mild to moderate RAE, moderate TR, mildly increased pulmonary artery systolic pressures  . Hyperlipidemia   . Hypertension   . Mitral regurgitation    a. Mod by echo 10/2014.  . Obesity   . Pulmonary hypertension ([place])    a. Mod-severely elevated PA pressures by echo 10/2014.  Marland Kitchen QT prolongation    a. During 10/2014 admit, QTC 560m.  . Tricuspid regurgitation    a. Mod by echo 10/2014.  .Marland KitchenUTI (lower urinary tract infection) 03/2015    Family History  family history includes Breast cancer (age of onset: 441 in her other; Diabetes in her mother; Heart attack in her mother; Hypertension in her brother, brother, sister, sister, sister, and sister; Lung cancer in her father; Thyroid disease in her sister and sister.  Prior Rehab/Hospitalizations:  Has the patient had major surgery during 100 days prior to admission? No  Current Medications   Current Facility-Administered Medications:  .   stroke: mapping our  early stages of recovery book, , Does not apply, Once, Costella, Vincent J, PA-C .  0.9 %  sodium chloride infusion, , Intravenous, Continuous, Costella, Vincent J, PA-C, Last Rate: 100 mL/hr at 12/23/17 1100 .  acetaminophen (TYLENOL) tablet 650 mg, 650 mg, Oral, Q4H PRN, 650 mg at 12/22/17 1543 **OR** acetaminophen (TYLENOL) solution 650 mg, 650 mg, Per Tube, Q4H PRN **OR** acetaminophen (TYLENOL) suppository 650 mg, 650 mg, Rectal, Q4H PRN, Costella, Vincent J, PA-C, 650 mg at 12/19/17 0109 .  calcium carbonate (OS-CAL - dosed in mg of elemental calcium) tablet 500 mg of elemental calcium, 1 tablet, Oral, Q breakfast, Nundkumar, NNena Polio MD, 500 mg of elemental calcium at 12/23/17 0750 .  cefTRIAXone (ROCEPHIN) 1 g in sodium chloride 0.9 % 100 mL IVPB, 1 g, Intravenous, Q24H, NConsuella Lose MD, Stopped at 12/22/17 1233 .  cholecalciferol (VITAMIN D) tablet 1,000 Units, 1,000 Units, Oral, Daily, Costella, Vincent JLenna Sciara PA-C, 1,000 Units at 12/23/17 0902 .  clevidipine (CLEVIPREX) infusion 0.5 mg/mL, 0-21 mg/hr, Intravenous, Continuous, Costella, VVista Mink PA-C, Stopped at 12/21/17 1559 .  docusate sodium (COLACE) capsule 100 mg, 100 mg, Oral, BID, Costella, VVista Mink PA-C, 100 mg at 12/22/17 2136 .  HYDROcodone-acetaminophen (NORCO/VICODIN) 5-325 MG per tablet 1-2 tablet, 1-2 tablet, Oral, Q6H PRN, Costella, VVista Mink PA-C, 2 tablet at 12/23/17 0858 .  irbesartan (AVAPRO) tablet 75 mg, 75 mg, Oral, Daily, Costella, Vincent J, PA-C, 75 mg at 12/23/17 0908 .  labetalol (NORMODYNE,TRANDATE) injection 10-40 mg, 10-40 mg, Intravenous, Q10 min PRN, Costella, Vincent J, PA-C, 20 mg at 12/23/17 0901 .  levETIRAcetam (KEPPRA) IVPB 500 mg/100 mL premix, 500 mg, Intravenous, Q12H, Costella, VVista Mink PA-C, Stopped at 12/23/17 [phone].  magnesium chloride (SLOW-MAG) 64 MG SR tablet 64 mg, 1 tablet, Oral, BID, Costella, Vincent JLenna Sciara PA-C, 64 mg at 12/23/17 0907 .  metoprolol tartrate (LOPRESSOR) tablet 25 mg, 25  mg, Oral, BID, Costella, Vincent J, PA-C, 25 mg at 12/23/17 0903 .  nitroGLYCERIN (NITROSTAT) SL tablet 0.4 mg, 0.4 mg, Sublingual, Q5 min PRN, Costella, Vincent J, PA-C .  ondansetron (ZOFRAN) injection 4 mg, 4 mg, Intravenous, Q6H PRN, NConsuella Lose MD, 4 mg at 12/23/17 0858 .  oxybutynin (DITROPAN) tablet 5 mg, 5 mg, Oral, Daily, Costella, Vincent J, PA-C, 5 mg at 12/23/17 0908 .  pantoprazole (PROTONIX) EC tablet 40 mg, 40 mg, Oral, Daily, Costella, Vincent J, PA-C, 40 mg at 12/23/17 0903 .  potassium chloride SA (K-DUR,KLOR-CON) CR tablet 20 mEq, 20 mEq, Oral, Daily, Costella, Vincent J, PA-C, 20 mEq at 12/23/17 0905 .  tamsulosin (FLOMAX) capsule 0.4 mg, 0.4 mg, Oral, Daily, Costella, Vincent J, PA-C, 0.4 mg at 12/23/17 0904 .  vitamin B-12 (CYANOCOBALAMIN) tablet 1,000 mcg, 1,000 mcg, Oral, Daily, Costella, Vincent J, PA-C, 1,000 mcg at 12/23/17 0908 .  vitamin C (ASCORBIC ACID) tablet 500 mg, 500 mg, Oral, Daily, Costella, Vincent J, PA-C, 500 mg at 12/23/17 0904 .  vitamin E capsule 400 Units, 400 Units, Oral, Daily, Traci Sermon, PA-C, 400 Units at 12/23/17 0165  Patients Current Diet:  Diet Order            DIET DYS 2 Room service appropriate? Yes; Fluid consistency: Thin  Diet effective now              Precautions / Restrictions Precautions Precautions: Fall Precaution Comments: pushing left Restrictions Weight Bearing Restrictions: No   Has the patient had 2 or more falls or a fall with injury in the past year?No  Prior Activity Level Community (5-7x/wk): Walked outside around [employer], [employer] and to [employer] daily.  Was not driving.  Home Assistive Devices / Equipment Home Assistive Devices/Equipment: Cane (specify quad or straight), Walker (specify type)  Prior Device Use: Indicate devices/aids used by the patient prior to current illness, exacerbation or injury? Walker and [employer]  Prior Functional Level Prior Function Comments: Pt unable to provide  information and no family present  Self Care: Did the patient need help bathing, dressing, using the toilet or eating?  Independent  Indoor Mobility: Did the patient need assistance with walking from room to room (with or without device)? Independent  Stairs: Did the patient need assistance with internal or external stairs (with or without device)? Independent  Functional Cognition: Did the patient need help planning regular tasks such as shopping or remembering to take medications? Independent  Current Functional Level Cognition  Overall Cognitive Status: Impaired/Different from baseline Current Attention Level: Sustained Orientation Level: Oriented to person, Oriented to place, Oriented to situation, Disoriented to time Following Commands: Follows one step commands inconsistently Safety/Judgement: Decreased awareness of safety, Decreased awareness of deficits General Comments: pt without hallucination today, left inattention, left bias Attention: Sustained Sustained Attention: Impaired Sustained Attention Impairment: Verbal basic, Functional basic Memory: Impaired Memory Impairment: Retrieval deficit, Decreased recall of new information, Decreased short term memory Decreased Short Term Memory: Verbal basic, Functional basic Awareness: Impaired Awareness Impairment: Anticipatory impairment, Intellectual impairment Problem Solving: Impaired Problem Solving Impairment: Verbal basic, Functional basic Executive Function: Writer: Impaired Organizing Impairment: Verbal basic, Functional basic Behaviors: Impulsive Safety/Judgment: Impaired    Extremity Assessment (includes Sensation/Coordination)  Upper Extremity Assessment: LUE deficits/detail LUE Deficits / Details: generalized weakness but attempting to use L hand funcitonally with cues LUE Coordination: decreased fine motor, decreased gross motor  Lower Extremity Assessment: Defer to PT evaluation LLE Deficits /  Details: noted stiffness and tone in LLE, limited overall assessment due to patient cognition LLE Sensation: decreased proprioception LLE Coordination: decreased fine motor, decreased gross motor    ADLs  Overall ADL's : Needs assistance/impaired Eating/Feeding: Minimal assistance, Sitting Eating/Feeding Details (indicate cue type and reason): Family present. Pt requires mod tactile cues to find utensils located on  L side; taking appropriate bite size; educated family on need to limit distractions during meals and to sit toward L side and cue pt to look L and find objects n tray in L field as needed. Family verbalized understnading. Used bolster adn pillow on L side to improve upright midline positioning for feeding. Grooming: Wash/dry face, Set up Grooming Details (indicate cue type and reason): Presenting with poor coorindation and proprioception during hadn hygiene. Requiring Max A to bring hands towards at midline and then rub hands together. When asking pt to bring her hands together, pt reaching her right hand to her left elbow and rubbing. Upper Body Bathing: Maximal assistance, Sitting Lower Body Bathing: Maximal assistance Lower Body Bathing Details (indicate cue type and reason): pt washing periarea due to incontinence Upper Body Dressing : Maximal assistance, Sitting Lower Body Dressing: Total assistance, Sit to/from stand Toilet Transfer: Maximal assistance, +2 for physical assistance, Stand-pivot, BSC Toileting- Clothing Manipulation and Hygiene: Maximal assistance, +2 for physical assistance Toileting - Clothing Manipulation Details (indicate cue type and reason): Max A +2 to stand, maintain standing, and perform peri care with second person. Pt able to perform peri care while sitting in recliner with back of chair reclined back. Pt perseverating on cleanign peri care.  Functional mobility during ADLs: Maximal assistance, +2 for physical assistance General ADL Comments: used over the  shoudler technique to facilitaet weight shifts and steps to chair    Mobility  Overal bed mobility: Needs Assistance Bed Mobility: Supine to Sit Rolling: Mod assist, +2 for physical assistance Supine to sit: +2 for safety/equipment, HOB elevated, Max assist General bed mobility comments: max assist to shift trunk to midline and elevate trunk from surface, cues for sequence with pt able to move LLE off of bed without assist, RLE hanging semi off bed on arrival    Transfers  Overall transfer level: Needs assistance Equipment used: 2 person hand held assist Transfers: Sit to/from Stand, Stand Pivot Transfers Sit to Stand: Mod assist, +2 physical assistance Stand pivot transfers: Max assist, +2 physical assistance General transfer comment: mod assist to rise from elevated bed with 2 person assist with 3 musketeer sequence with assist for anterior translation and rise. Pivot grossly 2' side stepping toward right then turning to chair to right with bil UE support on therapist shoulders. Cues and facilitation for trunk and hip extension. Pt positioned with wedge and pillows in chair    Ambulation / Gait / Stairs / Wheelchair Mobility  Ambulation/Gait General Gait Details: unable    Posture / Balance Dynamic Sitting Balance Sitting balance - Comments: L lateral lean; pushing Balance Overall balance assessment: Needs assistance Sitting-balance support: Feet supported Sitting balance-Leahy Scale: Poor Sitting balance - Comments: L lateral lean; pushing Postural control: Left lateral lean Standing balance-Leahy Scale: Poor Standing balance comment: bil UE supported in standing    Special needs/care consideration BiPAP/CPAP No CPM No Continuous Drip IV No Dialysis No         Life Vest No Oxygen No Special Bed No Trach Size No Wound Vac (area) No    Skin Has a skin tear on left arm; has a corn on the bottom of right foot that is bothering patient        Bowel mgmt: Last BM  12/17/17 Bladder mgmt: External catheter Diabetic mgmt No   Previous Home Environment Living Arrangements: Children  Lives With: Daughter Available Help at Discharge: Family, Available 24 hours/day, Friend(s) Type of Home: [place]: No Additional  Comments: no family present at this time, patient non reliable historian  Discharge Living Setting Plans for Discharge Living Setting: House, Lives with (comment)(Lives with daughter and son in law.) Type of Home at Discharge: House Discharge Home Layout: Two level, 1/2 bath on main level, Bed/bath upstairs, Able to live on main level with bedroom/bathroom(Patient's bedroom is upstairs, but do have BR on main level) Alternate Level Stairs-Number of Steps: 13 steps(Family considering a stair lift) Discharge Home Access: Ramped entrance(Has a ramp at the front entrance.) Discharge Bathroom Shower/Tub: Tub/shower unit, Door Discharge Bathroom Toilet: Standard Does the patient have any problems obtaining your medications?: No  Social/Family/Support Systems Patient Roles: Parent, Other (Comment)(Has a daughter, son in Sports coach and grand daughter.) Contact Information: Cleaster Corin - daughter Anticipated Caregiver: daughter Anticipated Caregiver's Contact Information: [name] - daughter - [phone] Ability/Limitations of Caregiver: Daughter is retired and can assist. Building control surveyor Availability: 24/7 Discharge Plan Discussed with Primary Caregiver: Yes Is Caregiver In Agreement with Plan?: Yes Does Caregiver/Family have Issues with Lodging/Transportation while Pt is in Rehab?: No  Goals/Additional Needs Patient/Family Goal for Rehab: PT/OT min to mod assist, SLP min assist goals Expected length of stay: 20-23 days Cultural Considerations: None Dietary Needs: Dys 2, thin liquids Equipment Needs: TBD Pt/Family Agrees to Admission and willing to participate: Yes Program Orientation Provided & Reviewed with Pt/Caregiver Including Roles   & Responsibilities: Yes  Decrease burden of Care through IP rehab admission: N/A  Possible need for SNF placement upon discharge: Yes, if daughter does not feel that she can manage at level obtained at time of discharge from CIR  Patient Condition: This patient's medical and functional status has changed since the consult dated: 12/21/17 in which the Rehabilitation Physician determined and documented that the patient's condition is appropriate for intensive rehabilitative care in an inpatient rehabilitation facility. See "History of Present Illness" (above) for medical update. Functional changes are:  Currently requiring max assist +2 for stand pivot transfers. Patient's medical and functional status update has been discussed with the Rehabilitation physician and patient remains appropriate for inpatient rehabilitation. Will admit to inpatient rehab today.  Preadmission Screen Completed By:  Retta Diones, 12/23/2017 12:11 PM ______________________________________________________________________   Discussed status with Dr. Posey Pronto on 12/23/17 at 1210 and received telephone approval for admission today.  Admission Coordinator:  Retta Diones, time 1210/Date 12/23/17

## 2017-12-24 ENCOUNTER — Inpatient Hospital Stay (HOSPITAL_COMMUNITY): Payer: Medicare Other | Admitting: Physical Therapy

## 2017-12-24 ENCOUNTER — Inpatient Hospital Stay (HOSPITAL_COMMUNITY): Payer: Medicare Other | Admitting: Speech Pathology

## 2017-12-24 ENCOUNTER — Encounter (HOSPITAL_COMMUNITY): Payer: Self-pay | Admitting: Physical Medicine and Rehabilitation

## 2017-12-24 ENCOUNTER — Inpatient Hospital Stay (HOSPITAL_COMMUNITY): Payer: Medicare Other

## 2017-12-24 DIAGNOSIS — E871 Hypo-osmolality and hyponatremia: Secondary | ICD-10-CM

## 2017-12-24 DIAGNOSIS — D62 Acute posthemorrhagic anemia: Secondary | ICD-10-CM

## 2017-12-24 DIAGNOSIS — I615 Nontraumatic intracerebral hemorrhage, intraventricular: Secondary | ICD-10-CM

## 2017-12-24 DIAGNOSIS — E876 Hypokalemia: Secondary | ICD-10-CM

## 2017-12-24 DIAGNOSIS — E8809 Other disorders of plasma-protein metabolism, not elsewhere classified: Secondary | ICD-10-CM

## 2017-12-24 DIAGNOSIS — E46 Unspecified protein-calorie malnutrition: Secondary | ICD-10-CM

## 2017-12-24 DIAGNOSIS — R339 Retention of urine, unspecified: Secondary | ICD-10-CM

## 2017-12-24 DIAGNOSIS — I10 Essential (primary) hypertension: Secondary | ICD-10-CM

## 2017-12-24 LAB — BASIC METABOLIC PANEL
Anion gap: 7 (ref 5–15)
BUN: 11 mg/dL (ref 8–23)
CALCIUM: 8.9 mg/dL (ref 8.9–10.3)
CO2: 30 mmol/L (ref 22–32)
CREATININE: 0.6 mg/dL (ref 0.44–1.00)
Chloride: 96 mmol/L — ABNORMAL LOW (ref 98–111)
GFR calc Af Amer: >60 mL/min (ref >60)
GFR calc non Af Amer: >60 mL/min (ref >60)
Glucose, Bld: 102 mg/dL — ABNORMAL HIGH (ref 70–99)
Potassium: 3.4 mmol/L — ABNORMAL LOW (ref 3.5–5.1)
SODIUM: 133 mmol/L — AB (ref 135–145)

## 2017-12-24 LAB — COMPREHENSIVE METABOLIC PANEL
ALT: 36 U/L (ref 0–44)
ANION GAP: 7 (ref 5–15)
AST: 35 U/L (ref 15–41)
Albumin: 2.8 g/dL — ABNORMAL LOW (ref 3.5–5.0)
Alkaline Phosphatase: 61 U/L (ref 38–126)
BILIRUBIN TOTAL: 1.5 mg/dL — AB (ref 0.3–1.2)
BUN: 11 mg/dL (ref 8–23)
CHLORIDE: 100 mmol/L (ref 98–111)
CO2: 25 mmol/L (ref 22–32)
Calcium: 8.4 mg/dL — ABNORMAL LOW (ref 8.9–10.3)
Creatinine, Ser: 0.67 mg/dL (ref 0.44–1.00)
GFR calc Af Amer: >60 mL/min (ref >60)
Glucose, Bld: 105 mg/dL — ABNORMAL HIGH (ref 70–99)
Potassium: 2.7 mmol/L — CL (ref 3.5–5.1)
Sodium: 132 mmol/L — ABNORMAL LOW (ref 135–145)
TOTAL PROTEIN: 6 g/dL — AB (ref 6.5–8.1)

## 2017-12-24 LAB — CBC WITH DIFFERENTIAL/PLATELET
Abs Immature Granulocytes: 0.21 10*3/uL — ABNORMAL HIGH (ref 0.00–0.07)
BASOS ABS: 0.1 10*3/uL (ref 0.0–0.1)
BASOS PCT: 1 %
EOS ABS: 0.3 10*3/uL (ref 0.0–0.5)
EOS PCT: 3 %
HEMATOCRIT: 30.7 % — AB (ref 36.0–46.0)
Hemoglobin: 10.4 g/dL — ABNORMAL LOW (ref 12.0–15.0)
IMMATURE GRANULOCYTES: 2 %
LYMPHS ABS: 1.2 10*3/uL (ref 0.7–4.0)
Lymphocytes Relative: 11 %
MCH: 29.9 pg (ref 26.0–34.0)
MCHC: 33.9 g/dL (ref 30.0–36.0)
MCV: 88.2 fL (ref 80.0–100.0)
MONOS PCT: 14 %
Monocytes Absolute: 1.5 10*3/uL — ABNORMAL HIGH (ref 0.1–1.0)
NEUTROS PCT: 69 %
NRBC: 0 % (ref 0.0–0.2)
Neutro Abs: 7.3 10*3/uL (ref 1.7–7.7)
PLATELETS: 177 10*3/uL (ref 150–400)
RBC: 3.48 MIL/uL — ABNORMAL LOW (ref 3.87–5.11)
RDW: 13.2 % (ref 11.5–15.5)
WBC: 10.5 10*3/uL (ref 4.0–10.5)

## 2017-12-24 LAB — GLUCOSE, CAPILLARY
GLUCOSE-CAPILLARY: 111 mg/dL — AB (ref 70–99)
Glucose-Capillary: 128 mg/dL — ABNORMAL HIGH (ref 70–99)

## 2017-12-24 LAB — MAGNESIUM: MAGNESIUM: 1.5 mg/dL — AB (ref 1.7–2.4)

## 2017-12-24 MED ORDER — PRO-STAT SUGAR FREE PO LIQD
30.0000 mL | Freq: Two times a day (BID) | ORAL | Status: DC
  Administered 2017-12-24 – 2018-01-18 (×47): 30 mL via ORAL
  Filled 2017-12-24 (×49): qty 30

## 2017-12-24 MED ORDER — POTASSIUM CHLORIDE CRYS ER 20 MEQ PO TBCR
40.0000 meq | EXTENDED_RELEASE_TABLET | Freq: Two times a day (BID) | ORAL | Status: AC
  Administered 2017-12-24 – 2017-12-25 (×3): 40 meq via ORAL
  Filled 2017-12-24 (×3): qty 2

## 2017-12-24 MED ORDER — POTASSIUM CHLORIDE 10 MEQ/100ML IV SOLN
10.0000 meq | Freq: Once | INTRAVENOUS | Status: AC
  Administered 2017-12-24: 10 meq via INTRAVENOUS
  Filled 2017-12-24: qty 100

## 2017-12-24 MED ORDER — POTASSIUM CHLORIDE 10 MEQ/100ML IV SOLN
10.0000 meq | INTRAVENOUS | Status: AC
  Administered 2017-12-24 (×4): 10 meq via INTRAVENOUS
  Filled 2017-12-24 (×6): qty 100

## 2017-12-24 MED ORDER — HYDROCODONE-ACETAMINOPHEN 5-325 MG PO TABS
1.0000 | ORAL_TABLET | ORAL | Status: DC | PRN
  Administered 2017-12-24 (×2): 1 via ORAL
  Filled 2017-12-24 (×2): qty 1

## 2017-12-24 MED ORDER — MAGNESIUM OXIDE 400 (241.3 MG) MG PO TABS
400.0000 mg | ORAL_TABLET | Freq: Two times a day (BID) | ORAL | Status: DC
  Administered 2017-12-24 – 2018-01-11 (×36): 400 mg via ORAL
  Filled 2017-12-24 (×36): qty 1

## 2017-12-24 NOTE — Progress Notes (Signed)
I spoke with Santiago Glad, RN and made her aware that K runs are contraindicated with midline placement. RN verbalized understanding.Patient has PIV.

## 2017-12-24 NOTE — Evaluation (Signed)
Physical Therapy Assessment and Plan  Patient Details  Name: [name] MRN: [id number] Date of Birth: Feb 03, 1927  PT Diagnosis: Abnormal posture, Abnormality of gait, Coordination disorder, Hemiplegia non-dominant, Impaired cognition, Impaired sensation and Muscle weakness Rehab Potential: Good ELOS: 3-4 weeks    Today's Date: 12/26/2017 PT Individual Time: [phone]    45 min   Problem List:  Patient Active Problem List   Diagnosis Date Noted  . ICH (intracerebral hemorrhage) ([place]) 12/23/2017  . History of Barrett's esophagus   . Urinary retention   . Seizure prophylaxis   . E. coli UTI   . Intraventricular hemorrhage ([place])   . Urinary tract infection without hematuria   . Coronary artery disease involving native coronary artery of native heart without angina pectoris   . Pulmonary HTN ([place])   . Leukocytosis   . Intracranial hemorrhage ([place]) 12/18/2017  . Syncope 04/10/2015  . Sepsis secondary to UTI ([place]) 04/10/2015  . URI (upper respiratory infection) 04/10/2015  . UTI (lower urinary tract infection) 04/10/2015  . QT prolongation 10/12/2014  . Headache 09/02/2012  . Tricuspid valve regurgitation, moderate 08/18/2012  . Hypokalemia 08/16/2012  . Hyponatremia 08/16/2012  . Atrial fibrillation ([place])   . Hypertension   . Anxiety   . Mixed hyperlipidemia 09/11/2009  . GERD 08/30/2008  . CHEST PAIN 08/30/2008  . Coronary atherosclerosis 04/04/2008  . Syncope and collapse 04/04/2008    Past Medical History:  Past Medical History:  Diagnosis Date  . Anxiety   . Arthritis   . Barrett's esophagus   . Chronic atrial fibrillation    a. Pt previously declined DCCV.  Marland Kitchen Coronary artery disease    a. s/p Cypher DES to LAD in 2006;  b. Last LHC 02/2008:  pLAD 30%, mLAD stent patent, pOM 50%, EF 65%;  c. Lexiscan Myoview 7/14:  Intermediate risk, dist ant and inf-lat ischemia, EF 66% - patient declined cath and elected medical therapy.  Marland Kitchen GERD (gastroesophageal reflux  disease)   . Hiatal hernia   . Hx of echocardiogram    Echocardiogram 08/17/12: EF 55-60%, MAC, mild MR, mild LAE, mild RVE, mild to moderate RAE, moderate TR, mildly increased pulmonary artery systolic pressures  . Hyperlipidemia   . Hypertension   . Mitral regurgitation    a. Mod by echo 10/2014.  . Obesity   . Pulmonary hypertension ([place])    a. Mod-severely elevated PA pressures by echo 10/2014.  Marland Kitchen QT prolongation    a. During 10/2014 admit, QTC 576m.  . Tricuspid regurgitation    a. Mod by echo 10/2014.  .Marland KitchenUTI (lower urinary tract infection) 03/2015   Past Surgical History:  Past Surgical History:  Procedure Laterality Date  . ABDOMINAL HYSTERECTOMY    . ANKLE SURGERY    . BREAST EXCISIONAL BIOPSY Right 1970  . BREAST EXCISIONAL BIOPSY Right   . BREAST EXCISIONAL BIOPSY Left   . CESAREAN SECTION    . CORONARY ANGIOPLASTY    . ESOPHAGOGASTRODUODENOSCOPY      Assessment & Plan Clinical Impression: Patient is a 84year old female with history of dementia, CAF- on Xarelto, pulmonary hypertension,  Barrett's esophagus; who was admitted on 12/18/2017 with complaints of headache and blurry vision.  History taken from chart review. CT of head reviewed, showing intraventricular hemorrhage.  Per report, large intraventricular hemorrhage filling most of the right ventricle with extension, development unilateral right lateral ventricle hydrocephalus with right-to-left shift of symptomatology and generalized atrophy.  CTA head was negative for aneurysm or AVM and  bleed felt to be due to amyloid angiopathy versus hypertension.  Elevated blood pressures was treated with Cleviprex and neurosurgery felt that patient with spontaneous bleed.  Xarelto was reversed and Dr. Kathyrn Sheriff recommended  systolic blood pressure goals of less than 160 and felt that there was no need for surgical intervention.  Mentation is improved with improvement in activity.  Diet has been advanced to dysphagia 2.  She was found  to have leukocytosis due to E. coli UTI and was started on  Ceftriaxone.  She has had issues with ssues with urinary retention  requiring in and out catheterizations.  Cleviprex has been weaned off but blood pressures remain labile requiring PRN labetalol.  Patient with resultant visual field deficits with gaze preference, visual hallucinations, difficulty keeping eyes open,  pusher tendencies and left bias with standing.  Patient transferred to CIR on 12/23/2017 .   Patient currently requires total with mobility secondary to muscle weakness and muscle joint tightness, decreased cardiorespiratoy endurance, motor apraxia and decreased coordination, decreased visual perceptual skills, decreased midline orientation and decreased attention to left, decreased initiation, decreased attention, decreased awareness, decreased problem solving, decreased safety awareness, decreased memory and delayed processing and decreased sitting balance, decreased standing balance, decreased postural control, hemiplegia and decreased balance strategies.  Prior to hospitalization, patient was modified independent  with mobility and lived with Daughter in a House home.  Home access is  Stairs to enter.  Patient will benefit from skilled PT intervention to maximize safe functional mobility, minimize fall risk and decrease caregiver burden for planned discharge home with 24 hour assist.  Anticipate patient will benefit from follow up [facility] at discharge.  PT - End of Session Activity Tolerance: Tolerates < 10 min activity with changes in vital signs Endurance Deficit: Yes PT Assessment Rehab Potential (ACUTE/IP ONLY): Good PT Barriers to Discharge: Inaccessible home environment;Medical stability;Decreased caregiver support;Home environment access/layout;Incontinence;Lack of/limited family support PT Patient demonstrates impairments in the following area(s):  Balance;Behavior;Edema;Endurance;Motor;Nutrition;Pain;Perception;Safety;Sensory;Skin Integrity PT Transfers Functional Problem(s): Bed Mobility;Bed to Chair;Furniture;Floor;Car PT Locomotion Functional Problem(s): Ambulation;Wheelchair Mobility;Stairs PT Plan PT Intensity: Minimum of 1-2 x/day ,45 to 90 minutes PT Frequency: 5 out of 7 days PT Duration Estimated Length of Stay: 3-4 weeks  PT Treatment/Interventions: Ambulation/gait training;Community reintegration;DME/adaptive equipment instruction;Neuromuscular re-education;Psychosocial support;Stair training;UE/LE Strength taining/ROM;Wheelchair propulsion/positioning;Therapeutic Activities;UE/LE Coordination activities;Skin care/wound management;Pain management;Functional electrical stimulation;Discharge planning;Balance/vestibular training;Cognitive remediation/compensation;Disease management/prevention;Functional mobility training;Patient/family education;Splinting/orthotics;Therapeutic Exercise;Visual/perceptual remediation/compensation PT Transfers Anticipated Outcome(s): Min assist with LRAD  PT Locomotion Anticipated Outcome(s): Ambulatory with min assist for short distances. min-supervision assist in [facility] PT Recommendation Recommendations for Other Services: Neuropsych consult Follow Up Recommendations: Home health PT Patient destination: Home Equipment Recommended: Wheelchair cushion (measurements);Wheelchair (measurements);To be determined;Rolling walker with 5" wheels  Skilled Therapeutic Intervention Per PA, Pt able to perform bed level therapy only due to Critical values Potassium. Following RN care for in/out cath, PT instructed patient in PT Evaluation and initiated treatment intervention; see below for results. PT educated patient in [place], rehab potential, rehab goals, and discharge recommendations. Pt left in bed with call bell in reach.    PT Evaluation Precautions/Restrictions   fall. Pusher. BP <834 systolically  General    Vital SignsTherapy Vitals Temp: 99 F (37.2 C) Temp Source: Oral Pulse Rate: 81 BP: (!) 155/103 Oxygen Therapy SpO2: 94 % Pain   Faces: hurts a lot. Home Living/Prior Functioning Home Living Available Help at Discharge: Family;Available 24 hours/day;Friend(s) Type of Home: House Home Access: Stairs to enter [employer] of Steps: 4-5 Entrance Stairs-Rails: Right;Left Home  Layout: Two level;Bed/bath upstairs Alternate Level Stairs-Number of Steps: flight Alternate Level Stairs-Rails: Right Bathroom Shower/Tub: Chiropodist: Standard  Lives With: Daughter Prior Function Vocation: Retired Leisure: Hobbies-yes (Comment) Comments: Enjoys watching soaps Vision/Perception    limited on the L. Pt noted to keep eyes closed unless instructed to open them for up to 5 seconds Cognition Overall Cognitive Status: Impaired/Different from baseline Arousal/Alertness: Lethargic Orientation Level: Oriented to person;Oriented to place;Oriented to situation Attention: Sustained Sustained Attention: Impaired Sustained Attention Impairment: Verbal basic;Functional basic Memory: Impaired Memory Impairment: Storage deficit;Retrieval deficit;Decreased recall of new information Awareness: Impaired Awareness Impairment: Intellectual impairment Problem Solving: Impaired Problem Solving Impairment: Verbal basic;Functional basic Executive Function: (all impaired due to lower level deficits ) Safety/Judgment: Impaired Comments: left inattention   Sensation Sensation Light Touch: Impaired by gross assessment Additional Comments: mild sensory deficits in the LUE and LLE noted through functional movement at bed level  Coordination Gross Motor Movements are Fluid and Coordinated: No Fine Motor Movements are Fluid and Coordinated: No Coordination and Movement Description: poor coordination due to strength, attention and visual deficits on the L Motor  Motor Motor: Other  (comment);Hemiplegia Motor - Skilled Clinical Observations: weakness, L hemi  Mobility Bed Mobility Bed Mobility: Rolling Right;Rolling Left;Supine to Sit;Sit to Supine Rolling Right: Total Assistance - Patient < 25% Rolling Left: Maximal Assistance - Patient 25-49% Supine to Sit: Total Assistance - Patient < 25% Sit to Supine: Total Assistance - Patient < 25% Locomotion  Gait Ambulation: No Gait Gait: No Stairs / Additional Locomotion Stairs: No Wheelchair Mobility Wheelchair Mobility: No  Trunk/Postural Assessment  Cervical Assessment Cervical Assessment: Exceptions to [facility] Thoracic Assessment Thoracic Assessment: Exceptions to [facility] Lumbar Assessment Lumbar Assessment: Exceptions to [facility] Postural Control Postural Control: Deficits on evaluation  Balance Balance Balance Assessed: Yes Static Sitting Balance Static Sitting - Balance Support: Right upper extremity supported Static Sitting - Level of Assistance: 2: Max assist;1: +1 Total assist Dynamic Sitting Balance Dynamic Sitting - Level of Assistance: 1: +1 Total assist Extremity Assessment      RLE Assessment RLE Assessment: Exceptions to [facility] Passive Range of Motion (PROM) Comments: pain with knee flexion past 90 degrees  General Strength Comments: grossly 4-/5 with movement in bed.  LLE Assessment LLE Assessment: Exceptions to [facility] Passive Range of Motion (PROM) Comments: [facility] General Strength Comments: grossly 3+/5 with movement in bed     Refer to Care Plan for Long Term Goals  Recommendations for other services: Neuropsych  Discharge Criteria: Patient will be discharged from PT if patient refuses treatment 3 consecutive times without medical reason, if treatment goals not met, if there is a change in medical status, if patient makes no progress towards goals or if patient is discharged from hospital.  The above assessment, treatment plan, treatment alternatives and goals were discussed and mutually agreed  upon: by patient  [name] 12/24/2017, 8:03 AM

## 2017-12-24 NOTE — Significant Event (Signed)
CRITICAL VALUE ALERT  Critical Value:  Potassium 2.7  Date & Time Notied:  12/24/17 0703  Provider Notified: Reesa Chew PA [phone]  Orders Received/Actions taken: 5 runs of IV potassium, 31mEQ of po potassium BID, and limit activities to patient room.

## 2017-12-24 NOTE — Significant Event (Signed)
CRITICAL VALUE ALERT  Critical Value:  Potassium 2.7  Date & Time Notied:  12/24/17 0703  Provider Notified: Michaelle Copas [phone]  Orders Received/Actions taken: 5 runs of IV potassium, 40 mEQ of po potassium BID, and limit activities to patient room.

## 2017-12-24 NOTE — Evaluation (Signed)
Speech Language Pathology Assessment and Plan  Patient Details  Name: AMEN DARGIS MRN: [id number] Date of Birth: [birth year]  SLP Diagnosis: Cognitive Impairments;Dysphagia  Rehab Potential: Good ELOS: 21-28 days     Today's Date: 12/24/2017 SLP Individual Time: 7989-2119 SLP Individual Time Calculation (min): 32 min   Problem List:  Patient Active Problem List   Diagnosis Date Noted  . Acute blood loss anemia   . Hypoalbuminemia due to protein-calorie malnutrition ([place])   . ICH (intracerebral hemorrhage) ([place]) 12/23/2017  . History of Barrett's esophagus   . Urinary retention   . Seizure prophylaxis   . E. coli UTI   . Intraventricular hemorrhage ([place])   . Urinary tract infection without hematuria   . Coronary artery disease involving native coronary artery of native heart without angina pectoris   . Pulmonary HTN ([place])   . Leukocytosis   . Intracranial hemorrhage ([place]) 12/18/2017  . Syncope 04/10/2015  . Sepsis secondary to UTI ([place]) 04/10/2015  . URI (upper respiratory infection) 04/10/2015  . UTI (lower urinary tract infection) 04/10/2015  . QT prolongation 10/12/2014  . Headache 09/02/2012  . Tricuspid valve regurgitation, moderate 08/18/2012  . Hypokalemia 08/16/2012  . Hyponatremia 08/16/2012  . Atrial fibrillation ([place])   . Hypertension   . Anxiety   . Mixed hyperlipidemia 09/11/2009  . GERD 08/30/2008  . CHEST PAIN 08/30/2008  . Coronary atherosclerosis 04/04/2008  . Syncope and collapse 04/04/2008   Past Medical History:  Past Medical History:  Diagnosis Date  . Ankle fracture   . Anxiety   . Arthritis   . Barrett's esophagus   . Chronic atrial fibrillation    a. Pt previously declined DCCV.  Marland Kitchen Coronary artery disease    a. s/p Cypher DES to LAD in 2006;  b. Last LHC 02/2008:  pLAD 30%, mLAD stent patent, pOM 50%, EF 65%;  c. Lexiscan Myoview 7/14:  Intermediate risk, dist ant and inf-lat ischemia, EF 66% - patient declined cath and elected  medical therapy.  Marland Kitchen GERD (gastroesophageal reflux disease)   . Hiatal hernia   . Hx of echocardiogram    Echocardiogram 08/17/12: EF 55-60%, MAC, mild MR, mild LAE, mild RVE, mild to moderate RAE, moderate TR, mildly increased pulmonary artery systolic pressures  . Hyperlipidemia   . Hypertension   . Mitral regurgitation    a. Mod by echo 10/2014.  . Obesity   . Pulmonary hypertension ([place])    a. Mod-severely elevated PA pressures by echo 10/2014.  Marland Kitchen QT prolongation    a. During 10/2014 admit, QTC 596m.  . Tricuspid regurgitation    a. Mod by echo 10/2014.  .Marland KitchenUTI (lower urinary tract infection) 03/2015   Past Surgical History:  Past Surgical History:  Procedure Laterality Date  . ABDOMINAL HYSTERECTOMY    . ANKLE SURGERY  2001  . BREAST EXCISIONAL BIOPSY Right 1970  . BREAST EXCISIONAL BIOPSY Right   . BREAST EXCISIONAL BIOPSY Left   . CESAREAN SECTION    . CORONARY ANGIOPLASTY    . ESOPHAGOGASTRODUODENOSCOPY      Assessment / Plan / Recommendation Clinical Impression   VVianny Schraeder [name] an 84year old female with history of dementia, CAF- on Xarelto, pulmonary hypertension,  Barrett's esophagus; who was admitted on 12/18/2017 with complaints of headache and blurry vision.  History taken from chart review. CT of head reviewed, showing intraventricular hemorrhage.  Per report, large intraventricular hemorrhage filling most of the right ventricle with extension, development unilateral right lateral ventricle hydrocephalus with  right-to-left shift of symptomatology and generalized atrophy.  CTA head was negative for aneurysm or AVM and bleed felt to be due to amyloid angiopathy versus hypertension.  Elevated blood pressures was treated with Cleviprex and neurosurgery felt that patient with spontaneous bleed.  Xarelto was reversed and Dr. Kathyrn Sheriff recommended  systolic blood pressure goals of less than 160 and felt that there was no need for surgical intervention.  Mentation is improved  with improvement in activity.  Diet has been advanced to dysphagia 2.  She was found to have leukocytosis due to E. coli UTI and was started on  Ceftriaxone.  She has had issues with ssues with urinary retention  requiring in and out catheterizations.  Cleviprex has been weaned off but blood pressures remain labile requiring PRN labetalol.  Patient with resultant visual field deficits with gaze preference, visual hallucinations, difficulty keeping eyes open,  pusher tendencies and left bias with standing.  CIR recommended due to functional deficits.  SLP evaluation was completed on 12/24/2017 with results as follows:  Pt presents with a cognitively based oral dysphagia characterized by prolonged but effective mastication of dys 2 solids resulting from decreased attention to boluses and fluctuating alertness.  No overt s/s of aspiration were evident with solids or liquids; therefore, recommend that pt remain on currently prescribed diet of dys 2 textures and thin liquids with full supervision for use of swallowing precautions.  Cognitive evaluation was limited by lethargy and/or medication effects (RN reports giving pain medications for headache prior to therapist's arrival.  When pt was maximally alert, she was oriented to place, date, and situation and she demonstrated decreased visual scanning to the left of midline with decreased use of objects on her left side.  Pt also demonstrated decreased sustained attention to tasks in the setting of lethargy which impacted all higher level cognitive processes.  As a result, pt currently requires max to total assist to complete tasks.  Pt would benefit from skilled ST while inpatient in order to maximize functional independence and reduce burden of care prior to discharge.  Anticipate that pt will need 24/7 supervision at discharge in addition to [place] follow up at next level of care.     Skilled Therapeutic Interventions          Cognitive-linguistic evaluation completed  with results and recommendations reviewed with patient.     SLP Assessment  Patient will need skilled Speech Lanaguage Pathology Services during CIR admission    Recommendations  SLP Diet Recommendations: Dysphagia 2 (Fine chop);Thin Liquid Administration via: Cup;Straw Medication Administration: Crushed with puree Supervision: Patient able to self feed;Staff to assist with self feeding;Full supervision/cueing for compensatory strategies Compensations: Slow rate;Small sips/bites Postural Changes and/or Swallow Maneuvers: Out of bed for meals;Seated upright 90 degrees;Upright 30-60 min after meal Oral Care Recommendations: Oral care BID Patient destination: Home Follow up Recommendations: Home Health SLP;24 hour supervision/assistance Equipment Recommended: None recommended by SLP    SLP Frequency 3 to 5 out of 7 days   SLP Duration  SLP Intensity  SLP Treatment/Interventions 21-28 days   Minumum of 1-2 x/day, 30 to 90 minutes  Cognitive remediation/compensation;Cueing hierarchy;Environmental controls;Functional tasks;Internal/external aids;Patient/family education    Pain Pain Assessment Pain Scale: 0-10 Pain Score: 9  Pain Location: Head Pain Descriptors / Indicators: Headache Pain Intervention(s): Repositioned;Distraction  Prior Functioning Cognitive/Linguistic Baseline: Baseline deficits Baseline deficit details: hx of dementia per HPI Type of Home: House  Lives With: Daughter Available Help at Discharge: Family;Available 24 hours/day;Friend(s) Education: 11th grade Vocation:  Retired  Industrial/product designer Term Goals: Week 1: SLP Short Term Goal 1 (Week 1): Pt will consume dys 2 textures and thin liquids with min cues for use of swallowing precautions and minimal overt s/s of aspiration.  SLP Short Term Goal 2 (Week 1): Pt will sustain her attention to basic, functional tasks for 5 minutes with mod cues for redirection.   SLP Short Term Goal 3 (Week 1): Pt will complete basic,  familiar tasks with mod assist for functional problem solving.   SLP Short Term Goal 4 (Week 1): Pt will locate items to the left of midline in ~50% of opportunities during basic, familiar tasks with mod assist.    Refer to Care Plan for Long Term Goals  Recommendations for other services: None   Discharge Criteria: Patient will be discharged from SLP if patient refuses treatment 3 consecutive times without medical reason, if treatment goals not met, if there is a change in medical status, if patient makes no progress towards goals or if patient is discharged from hospital.  The above assessment, treatment plan, treatment alternatives and goals were discussed and mutually agreed upon: No family available/patient unable  Emilio Math 12/24/2017, 9:08 PM

## 2017-12-24 NOTE — Evaluation (Signed)
Occupational Therapy Assessment and Plan  Patient Details  Name: [name] MRN: [id number] Date of Birth: January 10, 1927  OT Diagnosis: acute pain, cognitive deficits, hemiplegia affecting non-dominant side and muscle weakness (generalized) Rehab Potential: Rehab Potential (ACUTE ONLY): Good ELOS: 3-4 weeks   Today'Sonya Date: 12/24/2017 OT Individual Time: 1045-1200 OT Individual Time Calculation (min): 75 min     Problem List:  Patient Active Problem List   Diagnosis Date Noted  . Acute blood loss anemia   . Hypoalbuminemia due to protein-calorie malnutrition ([place])   . ICH (intracerebral hemorrhage) ([place]) 12/23/2017  . History of Barrett'Sonya esophagus   . Urinary retention   . Seizure prophylaxis   . E. coli UTI   . Intraventricular hemorrhage ([place])   . Urinary tract infection without hematuria   . Coronary artery disease involving native coronary artery of native heart without angina pectoris   . Pulmonary HTN ([place])   . Leukocytosis   . Intracranial hemorrhage ([place]) 12/18/2017  . Syncope 04/10/2015  . Sepsis secondary to UTI ([place]) 04/10/2015  . URI (upper respiratory infection) 04/10/2015  . UTI (lower urinary tract infection) 04/10/2015  . QT prolongation 10/12/2014  . Headache 09/02/2012  . Tricuspid valve regurgitation, moderate 08/18/2012  . Hypokalemia 08/16/2012  . Hyponatremia 08/16/2012  . Atrial fibrillation ([place])   . Hypertension   . Anxiety   . Mixed hyperlipidemia 09/11/2009  . GERD 08/30/2008  . CHEST PAIN 08/30/2008  . Coronary atherosclerosis 04/04/2008  . Syncope and collapse 04/04/2008    Past Medical History:  Past Medical History:  Diagnosis Date  . Ankle fracture   . Anxiety   . Arthritis   . Barrett'Sonya esophagus   . Chronic atrial fibrillation    a. Pt previously declined DCCV.  Marland Kitchen Coronary artery disease    a. Sonya/p Cypher DES to LAD in 2006;  b. Last LHC 02/2008:  pLAD 30%, mLAD stent patent, pOM 50%, EF 65%;  c. Lexiscan Myoview 7/14:   Intermediate risk, dist ant and inf-lat ischemia, EF 66% - patient declined cath and elected medical therapy.  Marland Kitchen GERD (gastroesophageal reflux disease)   . Hiatal hernia   . Hx of echocardiogram    Echocardiogram 08/17/12: EF 55-60%, MAC, mild MR, mild LAE, mild RVE, mild to moderate RAE, moderate TR, mildly increased pulmonary artery systolic pressures  . Hyperlipidemia   . Hypertension   . Mitral regurgitation    a. Mod by echo 10/2014.  . Obesity   . Pulmonary hypertension ([place])    a. Mod-severely elevated PA pressures by echo 10/2014.  Marland Kitchen QT prolongation    a. During 10/2014 admit, QTC 58m.  . Tricuspid regurgitation    a. Mod by echo 10/2014.  .Marland KitchenUTI (lower urinary tract infection) 03/2015   Past Surgical History:  Past Surgical History:  Procedure Laterality Date  . ABDOMINAL HYSTERECTOMY    . ANKLE SURGERY  2001  . BREAST EXCISIONAL BIOPSY Right 1970  . BREAST EXCISIONAL BIOPSY Right   . BREAST EXCISIONAL BIOPSY Left   . CESAREAN SECTION    . CORONARY ANGIOPLASTY    . ESOPHAGOGASTRODUODENOSCOPY      Assessment & Plan Clinical Impression: VTanvi Pope SRenee Pope [name] with history of dementia, CAF- on Xarelto, pulmonary hypertension,  Barrett'Sonya esophagus; who was admitted on 12/18/2017 with complaints of headache and blurry vision.  History taken from chart review. CT of head reviewed, showing intraventricular hemorrhage.  Per report, large intraventricular hemorrhage filling most of the right ventricle  with extension, development unilateral right lateral ventricle hydrocephalus with right-to-left shift of symptomatology and generalized atrophy.  CTA head was negative for aneurysm or AVM and bleed felt to be due to amyloid angiopathy versus hypertension.  Elevated blood pressures was treated with Cleviprex and neurosurgery felt that patient with spontaneous bleed.  Xarelto was reversed and Dr. Kathyrn Sheriff recommended  systolic blood pressure goals of less than 160 and felt  that there was no need for surgical intervention.  Mentation is improved with improvement in activity.  Diet has been advanced to dysphagia 2.  She was found to have leukocytosis due to E. coli UTI and was started on  Ceftriaxone.  She has had issues with ssues with urinary retention  requiring in and out catheterizations.  Cleviprex has been weaned off but blood pressures remain labile requiring PRN labetalol.  Patient with resultant visual field deficits with gaze preference, visual hallucinations, difficulty keeping eyes open,  pusher tendencies and left bias with standing.  CIR recommended due to functional deficits Patient transferred to CIR on 12/23/2017 .    Patient currently requires total with basic self-care skills secondary to muscle weakness, decreased cardiorespiratoy endurance, decreased coordination and decreased motor planning, decreased visual acuity, decreased midline orientation, decreased initiation, decreased attention, decreased awareness, decreased problem solving, decreased safety awareness, decreased memory and delayed processing and decreased sitting balance, decreased standing balance, decreased postural control, hemiplegia and decreased balance strategies.  Prior to hospitalization, patient could complete ADLs with modified independent .  Patient will benefit from skilled intervention to decrease level of assist with basic self-care skills prior to discharge home with care partner.  Anticipate patient will require intermittent supervision and minimal physical assistance and follow up home health.  OT - End of Session Activity Tolerance: Tolerates < 10 min activity with changes in vital signs Endurance Deficit: Yes Endurance Deficit Description: generalized weakness OT Assessment Rehab Potential (ACUTE ONLY): Good OT Barriers to Discharge: Inaccessible home environment;Decreased caregiver support OT Patient demonstrates impairments in the following area(Sonya):  Balance;Nutrition;Vision;Pain;Perception;Cognition;Edema;Safety;Endurance;Sensory;Motor;Skin Integrity OT Basic ADL'Sonya Functional Problem(Sonya): Eating;Grooming;Bathing;Dressing;Toileting OT Transfers Functional Problem(Sonya): Toilet;Tub/Shower OT Additional Impairment(Sonya): None OT Plan OT Intensity: Minimum of 1-2 x/day, 45 to 90 minutes OT Frequency: 5 out of 7 days OT Duration/Estimated Length of Stay: 3-4 weeks OT Treatment/Interventions: Balance/vestibular training;Community reintegration;Disease mangement/prevention;Functional electrical stimulation;Neuromuscular re-education;Patient/family education;Self Care/advanced ADL retraining;Therapeutic Exercise;UE/LE Coordination activities;Wheelchair propulsion/positioning;Cognitive remediation/compensation;Discharge planning;DME/adaptive equipment instruction;Functional mobility training;Pain management;Psychosocial support;Skin care/wound managment;Therapeutic Activities;UE/LE Strength taining/ROM;Visual/perceptual remediation/compensation;Splinting/orthotics OT Self Feeding Anticipated Outcome(Sonya): set up OT Basic Self-Care Anticipated Outcome(Sonya): min A OT Toileting Anticipated Outcome(Sonya): min A OT Bathroom Transfers Anticipated Outcome(Sonya): min A OT Recommendation Recommendations for Other Services: Speech consult;Neuropsych consult Patient destination: Home Follow Up Recommendations: Home health OT Equipment Recommended: To be determined   Skilled Therapeutic Intervention Pt seen for skilled OT evaluation. Pt'Sonya daughter present during session and education was provided to her and pt re OT POC, ELOS, d/c planning, and rehab expectations. Pt required max verbal cueing for arousal, opening eyes, and participation in therapy. D/t low K levels pt confined to bed level services. With [facility] pt able to bring washcloth to face and wash face/chest. Max/total A +2 provided for all bathing and bed level toileting. Pt required max A for self feeding at bed level.  Pt was left supine in bed with all needs met and RN present.   OT Evaluation Precautions/Restrictions  Precautions Precautions: Fall Precaution Comments: pushing left Restrictions Weight Bearing Restrictions: No General Chart Reviewed: Yes PT Missed Treatment Reason: Nursing  care Family/Caregiver Present: Yes Vital Signs Therapy Vitals Temp: 98.7 F (37.1 C) Pulse Rate: 77 Resp: 19 BP: (!) 157/77 Patient Position (if appropriate): Lying Oxygen Therapy SpO2: 94 % O2 Device: Room Air Pain Pain Assessment Pain Scale: 0-10 Pain Score: 8  Pain Type: Acute pain Pain Location: Head Pain Orientation: Anterior Pain Descriptors / Indicators: Headache Pain Frequency: Constant Pain Onset: On-going Pain Intervention(Sonya): Cold applied;Therapeutic touch Home Living/Prior Functioning Home Living Family/patient expects to be discharged to:: Private residence Living Arrangements: Children Available Help at Discharge: Family, Available 24 hours/day, Friend(Sonya) Type of Home: House Home Access: Stairs to enter [employer] of Steps: 4-5 Entrance Stairs-Rails: Right, Left Home Layout: Two level, Bed/bath upstairs Alternate Level Stairs-Number of Steps: flight Alternate Level Stairs-Rails: Right Bathroom Shower/Tub: Chiropodist: Standard  Lives With: Daughter IADL History Homemaking Responsibilities: No Prior Function Vocation: Retired Leisure: Hobbies-yes (Comment) Comments: Enjoys watching soaps ADL ADL Eating: Maximal assistance Where Assessed-Eating: Bed level Grooming: Maximal assistance Where Assessed-Grooming: Bed level Upper Body Bathing: Maximal assistance Where Assessed-Upper Body Bathing: Bed level Lower Body Bathing: Dependent Where Assessed-Lower Body Bathing: Bed level Upper Body Dressing: Dependent Where Assessed-Upper Body Dressing: Bed level Lower Body Dressing: Unable to assess Toileting: Dependent Where Assessed-Toileting:  Bed level Toilet Transfer: Unable to assess Tub/Shower Transfer: Unable to assess Social research officer, government: Unable to assess Vision Baseline Vision/History: Wears glasses Wears Glasses: At all times Patient Visual Report: Eye fatigue/eye pain/headache Vision Assessment?: Vision impaired- to be further tested in functional context Additional Comments: head turned R throughout treatment with eyes closed, opened for 5 sec at most when cued by PT Perception  Perception: Impaired Inattention/Neglect: Does not attend to left visual field;Does not attend to left side of body Praxis Praxis: Impaired Praxis Impairment Details: Ideation;Initiation;Ideomotor;Motor planning Praxis-Other Comments: poor initiation of movement as well as motor planning at bed level  Cognition Overall Cognitive Status: History of cognitive impairments - at baseline Arousal/Alertness: Lethargic Orientation Level: Person;Place;Situation Person: Oriented Place: Oriented Situation: Oriented Year: 2019 Month: November Day of Week: Incorrect Memory: Impaired Memory Impairment: Retrieval deficit;Decreased recall of new information;Decreased short term memory Decreased Short Term Memory: Verbal basic;Functional basic Immediate Memory Recall: Sock;Blue;Bed Attention: Sustained Focused Attention: Appears intact Sustained Attention: Impaired Sustained Attention Impairment: Verbal basic;Functional basic Selective Attention: Impaired Selective Attention Impairment: Verbal basic;Functional basic;Verbal complex;Functional complex Awareness: Impaired Awareness Impairment: Anticipatory impairment;Emergent impairment Problem Solving: Impaired Problem Solving Impairment: Verbal basic;Functional basic Executive Function: Writer: Impaired Organizing Impairment: Verbal basic;Verbal complex Behaviors: Impulsive Safety/Judgment: Impaired Comments: pt noted to to have poor awareness of deficits, but oriented to  location and vaguely oriented to situation Sensation Sensation Light Touch: Impaired by gross assessment Additional Comments: mild sensory deficits in the LUE and LLE noted through functional movement at bed level  Coordination Gross Motor Movements are Fluid and Coordinated: No Fine Motor Movements are Fluid and Coordinated: No Coordination and Movement Description: poor coordination due to strength, attention and visual deficits on the L Motor  Motor Motor: Other (comment);Hemiplegia Motor - Skilled Clinical Observations: weakness, L hemi Mobility  Bed Mobility Bed Mobility: Rolling Right;Rolling Left Rolling Right: 2 Helpers Rolling Left: 2 Helpers  Trunk/Postural Assessment  Cervical Assessment Cervical Assessment: Exceptions to [facility] Thoracic Assessment Thoracic Assessment: Exceptions to [facility] Lumbar Assessment Lumbar Assessment: Exceptions to [facility] Postural Control Postural Control: Deficits on evaluation  Balance Balance Balance Assessed: No(unable to assess) Extremity/Trunk Assessment RUE Assessment RUE Assessment: Exceptions to [facility] General Strength Comments: 3+/5 LUE Assessment LUE Assessment: Exceptions  to [place] Strength Comments: 3-/5     Refer to Care Plan for Long Term Goals  Recommendations for other services: Neuropsych   Discharge Criteria: Patient will be discharged from OT if patient refuses treatment 3 consecutive times without medical reason, if treatment goals not met, if there is a change in medical status, if patient makes no progress towards goals or if patient is discharged from hospital.  The above assessment, treatment plan, treatment alternatives and goals were discussed and mutually agreed upon: by patient and by family  Curtis Sites 12/24/2017, 3:33 PM

## 2017-12-24 NOTE — Progress Notes (Signed)
Patient information reviewed and entered into eRehab system by Janal Haak, RN, CRRN, PPS Coordinator.  Information including medical coding, functional ability and quality indicators will be reviewed and updated through discharge.     Per nursing patient was given "Data Collection Information Summary for Patients in Inpatient Rehabilitation Facilities with attached "Privacy Act Statement-Health Care Records" upon admission.  

## 2017-12-24 NOTE — Progress Notes (Signed)
[place] PHYSICAL MEDICINE & REHABILITATION PROGRESS NOTE  Subjective/Complaints: Patient seen laying in bed this AM.  She is slow to arouse.    ROS: Appears to deny CP, SOB, N/V/D.   Objective: Vital Signs: Blood pressure (!) 155/103, pulse 81, temperature 99 F (37.2 C), temperature source Oral, resp. rate 14, height 5\' 5"  (1.651 m), weight 84.1 kg, SpO2 94 %. No results found. Recent Labs    12/24/17 0548  WBC 10.5  HGB 10.4*  HCT 30.7*  PLT 177   Recent Labs    12/24/17 0548  NA 132*  K 2.7*  CL 100  CO2 25  GLUCOSE 105*  BUN 11  CREATININE 0.67  CALCIUM 8.4*    Physical Exam: BP (!) 155/103   Pulse 81   Temp 99 F (37.2 C) (Oral)   Resp 14   Ht 5\' 5"  (1.651 m)   Wt 84.1 kg   SpO2 94%   BMI 30.85 kg/m  Constitutional: She appears well-developed. Frail.  HENT: Normocephalic and atraumatic.  Eyes: Barely opens eyes. EOMI.  Cardiovascular: Irregularly irregular. No JVD.  Respiratory: Effort normal and breath sounds normal.  GI: Bowel sounds are normal.  Nondistended. Musculoskeletal:  Edema most significant in right upper extremity  Neurological:  Lethargic.  Oriented x2. BUE tremors noted.  Dysarthria  Motor: Limited due to participation RUE: 4/5 proximal to distal RLE: 2/5 proximal to distal (?effort) LUE: 2/5 proximal to distal (?effort) LLE: 3-/5 proximal to distal   Skin: Skin is warm and dry.  BUE with diffuse ecchymotic areas.   Psychiatric: Unable to assess due to mentation   Assessment/Plan: 1. Functional deficits secondary to right intraventricular hemorrhage which require 3+ hours per day of interdisciplinary therapy in a comprehensive inpatient rehab setting.  Physiatrist is providing close team supervision and 24 hour management of active medical problems listed below.  Physiatrist and rehab team continue to assess barriers to discharge/monitor patient progress toward functional and medical goals  Care Tool:  Bathing               Bathing assist       Upper Body Dressing/Undressing Upper body dressing        Upper body assist      Lower Body Dressing/Undressing Lower body dressing            Lower body assist       Toileting Toileting    Toileting assist       Transfers Chair/bed transfer  Transfers assist           Locomotion Ambulation   Ambulation assist              Walk 10 feet activity   Assist           Walk 50 feet activity   Assist           Walk 150 feet activity   Assist           Walk 10 feet on uneven surface  activity   Assist           Wheelchair     Assist               Wheelchair 50 feet with 2 turns activity    Assist            Wheelchair 150 feet activity     Assist            Medical Problem List and Plan: 1.  Visual field deficits with gaze preference, visual hallucinations, difficulty keeping eyes open,  pusher tendencies and left bias with standing secondary to large intraventricular hemorrhage   Begin CIR, bedside level due to electrolyte abnormalities 2.  DVT Prophylaxis/Anticoagulation: Mechanical: Sequential compression devices, below knee Bilateral lower extremities 3. Headaches/Pain Management: Limit sedating medications. Started Topamax at bedtime and limit hydrocodone as needed for severe HA.  4. Mood: LCSW to follow for evaluation and support when appropriate. Resume home ativan prn at lower dose.  5. Neuropsych: This patient is not capable of making decisions on her own behalf 6. Skin/Wound Care: Routine pressure relief measures 7. Fluids/Electrolytes/Nutrition: Monitor I's and O's.   8.  E. coli UTI: Change ceftriaxone to amoxicillin--day number 7/7 antibiotic regimen 9.  HTN: Blood pressure goals less than 160/90.  Continue metoprolol twice daily and increased Avapro to 150 mg for blood pressure control.  Monitor with increased mobility 10.  Seizure prophylaxis:  Continue Keppra twice daily. 11.  Urinary retention: Discontinued Ditropan.  Continue Flomax routine pressure relief measures. 12.  Leukocytosis: Resolved  WBCs 10.5 and 11/7  Continue to monitor 13. Hyponatremia:   Sodium 132 on 11/7  Labs ordered for tomorrow  Continue to monitor 14. H/o Barrett's esophagus/GERD: On Protonix 15.  Hypokalemia  Critical value of 2.7 on 11/7  Magnesium ordered as well  Supplementing  Labs ordered for later today and tomorrow 16.  Hypoalbuminemia  Supplement initiated on 11/7 17.  Acute blood loss anemia  Hemoglobin 10.4 on 11/7  Labs ordered for tomorrow  LOS: 1 days A FACE TO FACE EVALUATION WAS PERFORMED  Ankit [name] 12/24/2017, 8:59 AM

## 2017-12-25 ENCOUNTER — Inpatient Hospital Stay (HOSPITAL_COMMUNITY): Payer: Medicare Other

## 2017-12-25 ENCOUNTER — Inpatient Hospital Stay (HOSPITAL_COMMUNITY): Payer: Medicare Other | Admitting: Physical Therapy

## 2017-12-25 ENCOUNTER — Inpatient Hospital Stay (HOSPITAL_COMMUNITY): Payer: Medicare Other | Admitting: Speech Pathology

## 2017-12-25 LAB — URINALYSIS, ROUTINE W REFLEX MICROSCOPIC
BACTERIA UA: NONE SEEN
Bilirubin Urine: NEGATIVE
Glucose, UA: NEGATIVE mg/dL
Ketones, ur: 20 mg/dL — AB
LEUKOCYTES UA: NEGATIVE
Nitrite: NEGATIVE
Protein, ur: NEGATIVE mg/dL
Specific Gravity, Urine: 1.014 (ref 1.005–1.030)
pH: 8 (ref 5.0–8.0)

## 2017-12-25 LAB — CBC WITH DIFFERENTIAL/PLATELET
ABS IMMATURE GRANULOCYTES: 0.29 10*3/uL — AB (ref 0.00–0.07)
BASOS PCT: 1 %
Basophils Absolute: 0.1 10*3/uL (ref 0.0–0.1)
EOS ABS: 0.2 10*3/uL (ref 0.0–0.5)
EOS PCT: 2 %
HEMATOCRIT: 33.8 % — AB (ref 36.0–46.0)
HEMOGLOBIN: 10.9 g/dL — AB (ref 12.0–15.0)
IMMATURE GRANULOCYTES: 2 %
Lymphocytes Relative: 8 %
Lymphs Abs: 1.1 10*3/uL (ref 0.7–4.0)
MCH: 28.5 pg (ref 26.0–34.0)
MCHC: 32.2 g/dL (ref 30.0–36.0)
MCV: 88.5 fL (ref 80.0–100.0)
MONO ABS: 2 10*3/uL — AB (ref 0.1–1.0)
Monocytes Relative: 16 %
NEUTROS ABS: 9.1 10*3/uL — AB (ref 1.7–7.7)
Neutrophils Relative %: 71 %
PLATELETS: 230 10*3/uL (ref 150–400)
RBC: 3.82 MIL/uL — ABNORMAL LOW (ref 3.87–5.11)
RDW: 13.1 % (ref 11.5–15.5)
WBC: 12.7 10*3/uL — AB (ref 4.0–10.5)
nRBC: 0 % (ref 0.0–0.2)

## 2017-12-25 LAB — BASIC METABOLIC PANEL
Anion gap: 9 (ref 5–15)
BUN: 12 mg/dL (ref 8–23)
CALCIUM: 8.8 mg/dL — AB (ref 8.9–10.3)
CO2: 28 mmol/L (ref 22–32)
CREATININE: 0.65 mg/dL (ref 0.44–1.00)
Chloride: 95 mmol/L — ABNORMAL LOW (ref 98–111)
GFR calc Af Amer: >60 mL/min (ref >60)
Glucose, Bld: 98 mg/dL (ref 70–99)
POTASSIUM: 3.5 mmol/L (ref 3.5–5.1)
SODIUM: 132 mmol/L — AB (ref 135–145)

## 2017-12-25 MED ORDER — POTASSIUM CHLORIDE IN NACL 20-0.9 MEQ/L-% IV SOLN
INTRAVENOUS | Status: DC
  Administered 2017-12-25 – 2017-12-26 (×2): via INTRAVENOUS
  Filled 2017-12-25: qty 1000

## 2017-12-25 MED ORDER — PIPERACILLIN-TAZOBACTAM 3.375 G IVPB
3.3750 g | Freq: Three times a day (TID) | INTRAVENOUS | Status: DC
  Administered 2017-12-25 – 2017-12-28 (×8): 3.375 g via INTRAVENOUS
  Filled 2017-12-25 (×10): qty 50

## 2017-12-25 MED ORDER — POTASSIUM CHLORIDE CRYS ER 20 MEQ PO TBCR
20.0000 meq | EXTENDED_RELEASE_TABLET | Freq: Two times a day (BID) | ORAL | Status: DC
  Administered 2017-12-26 – 2017-12-28 (×5): 20 meq via ORAL
  Filled 2017-12-25 (×5): qty 1

## 2017-12-25 MED ORDER — NITROGLYCERIN 2 % TD OINT
0.5000 [in_us] | TOPICAL_OINTMENT | Freq: Four times a day (QID) | TRANSDERMAL | Status: DC | PRN
  Filled 2017-12-25: qty 30

## 2017-12-25 MED ORDER — NITROGLYCERIN 2 % TD OINT
0.5000 [in_us] | TOPICAL_OINTMENT | Freq: Four times a day (QID) | TRANSDERMAL | Status: DC | PRN
  Administered 2017-12-25: 0.5 [in_us] via TOPICAL
  Filled 2017-12-25: qty 30

## 2017-12-25 MED ORDER — VANCOMYCIN HCL IN DEXTROSE 750-5 MG/150ML-% IV SOLN
750.0000 mg | Freq: Two times a day (BID) | INTRAVENOUS | Status: DC
  Administered 2017-12-25 – 2017-12-27 (×4): 750 mg via INTRAVENOUS
  Filled 2017-12-25 (×4): qty 150

## 2017-12-25 MED ORDER — SODIUM CHLORIDE 0.9 % IV SOLN
INTRAVENOUS | Status: DC
  Filled 2017-12-25 (×2): qty 1000

## 2017-12-25 MED ORDER — POLYETHYLENE GLYCOL 3350 17 G PO PACK
17.0000 g | PACK | Freq: Every day | ORAL | Status: DC
  Administered 2017-12-27 – 2018-01-18 (×19): 17 g via ORAL
  Filled 2017-12-25 (×24): qty 1

## 2017-12-25 MED ORDER — ACETAMINOPHEN 650 MG RE SUPP
650.0000 mg | RECTAL | Status: DC | PRN
  Administered 2017-12-25: 650 mg via RECTAL
  Filled 2017-12-25: qty 1

## 2017-12-25 MED ORDER — SODIUM CHLORIDE 0.9 % IV SOLN
1.0000 g | INTRAVENOUS | Status: DC
  Administered 2017-12-25: 1 g via INTRAVENOUS
  Filled 2017-12-25: qty 10

## 2017-12-25 NOTE — Progress Notes (Signed)
Occupational Therapy Note  Patient Details  Name: EVERLIE EBLE MRN: [id number] Date of Birth: 05/18/1926  Today's Date: 12/25/2017 OT Missed Time: 90 Minutes Missed Time Reason: MD hold (comment)(Per PA hold tx at this time)  RN and PA in room upon arrival. Pt unable to be aroused despite sternal rub. PA suspecting UTI and holding tx at this time via verbal order. Will follow up when appropriate   Tonny Branch 12/25/2017, 4:23 PM

## 2017-12-25 NOTE — Progress Notes (Signed)
Patient with increase lethargy today. Question quality of sleep but also with fever this pm. WBC elevated this am despite having completed antibiotic for E coli UTI yesterday. Has continue to have urinary retention requiring I/O caths every 4-5 hours. Unable to express needs, limited mobility and high risk for breakdown.

## 2017-12-25 NOTE — Progress Notes (Signed)
Addendum:  Pt a little more arousable this afternoon but still lethargic and febrile. CT of head personally reviewed and actually demonstrates some improvement in hemorrhage. Chest XR unremarkable. Picture still concerning for potential sepsis.  Plan: 1. Broaden abx coverage to vanc and zosyn 2. Contact [facility] and request that they follow along with patient 3. Continue IVF, decrease rate to 75cc/hr 4. Blood cx pending   Meredith Staggers, MD, [place] [facility] 12/25/2017

## 2017-12-25 NOTE — Consult Note (Signed)
Medical Consultation   [name]  DVV:[id number]  DOB: 07/06/1926  DOA: 12/23/2017  PCP: Merrilee Seashore, MD     Requesting physician: Dr. Alger Simons  Reason for consultation: Fever   History of Present Illness: [name] is an 84 y.o. female with history of dementia, chronic atrial fibrillation on Xarelto until recently, pulmonary hypertension, Barrett's esophagus who was recently admitted on 12/18/2017 patient for a large intraventricular hemorrhage which was treated conservatively along with E. coli UTI which was initially treated with Rocephin and switched to Keflex and discharged to inpatient rehab.  Hospitalist service was consulted today for fever and increased lethargy.  Patient has been started on broad-spectrum antibiotics today.  Patient does not provide any history as she is very drowsy.   Review of Systems:  Could not be obtained because of patient being very drowsy   Past Medical History: Past Medical History:  Diagnosis Date  . Ankle fracture   . Anxiety   . Arthritis   . Barrett's esophagus   . Chronic atrial fibrillation    a. Pt previously declined DCCV.  Marland Kitchen Coronary artery disease    a. s/p Cypher DES to LAD in 2006;  b. Last LHC 02/2008:  pLAD 30%, mLAD stent patent, pOM 50%, EF 65%;  c. Lexiscan Myoview 7/14:  Intermediate risk, dist ant and inf-lat ischemia, EF 66% - patient declined cath and elected medical therapy.  Marland Kitchen GERD (gastroesophageal reflux disease)   . Hiatal hernia   . Hx of echocardiogram    Echocardiogram 08/17/12: EF 55-60%, MAC, mild MR, mild LAE, mild RVE, mild to moderate RAE, moderate TR, mildly increased pulmonary artery systolic pressures  . Hyperlipidemia   . Hypertension   . Mitral regurgitation    a. Mod by echo 10/2014.  . Obesity   . Pulmonary hypertension ([place])    a. Mod-severely elevated PA pressures by echo 10/2014.  Marland Kitchen QT prolongation    a. During 10/2014 admit, QTC 514m.  . Tricuspid  regurgitation    a. Mod by echo 10/2014.  .Marland KitchenUTI (lower urinary tract infection) 03/2015    Past Surgical History: Past Surgical History:  Procedure Laterality Date  . ABDOMINAL HYSTERECTOMY    . ANKLE SURGERY  2001  . BREAST EXCISIONAL BIOPSY Right 1970  . BREAST EXCISIONAL BIOPSY Right   . BREAST EXCISIONAL BIOPSY Left   . CESAREAN SECTION    . CORONARY ANGIOPLASTY    . ESOPHAGOGASTRODUODENOSCOPY       Allergies:   Allergies  Allergen Reactions  . Amlodipine Shortness Of Breath and Swelling    Edema in legs  . Methyldopa Shortness Of Breath and Swelling  . Actonel [Risedronate Sodium] Other (See Comments)    Caused a lump to come up in throat  . Ciprofloxacin Hcl     hallucinations  . Hctz [Hydrochlorothiazide] Itching    Pt reports causing "itching under the skin, with no rash noted."  . Hydralazine Hcl     Unknown, per pt   . Lisinopril Cough  . Oxycodone Hcl     hallucinations  . Spironolactone     Itchiing  . Tape Itching  . Valsartan     Unknown, per pt     Social History:  reports that she has quit smoking. Her smoking use included cigarettes. She has never used smokeless tobacco. She reports that she does not drink alcohol or use drugs.  Family History: Family History  Problem Relation Age of Onset  . Diabetes Mother   . Heart attack Mother   . Lung cancer Father   . Thyroid disease Sister   . Thyroid disease Sister   . Hypertension Sister   . Hypertension Sister   . Hypertension Sister   . Hypertension Sister   . Hypertension Brother   . Hypertension Brother   . Breast cancer Other 45  . Colon cancer Neg Hx   . Colon polyps Neg Hx   . Esophageal cancer Neg Hx   . Stroke Neg Hx     Physical Exam: Vitals:   12/25/17 1330 12/25/17 1452 12/25/17 1500 12/25/17 1622  BP:  (!) 169/73 (!) 164/81 (!) 146/80  Pulse:  77 79 76  Resp:   16   Temp: (!) 102.1 F (38.9 C) (!) 101.1 F (38.4 C)    TempSrc: Rectal     SpO2:      Weight:        Height:        Constitutional: Elderly female lying in bed.  Very drowsy, hardly wakes up on calling her name  eyes: PERRL, lids and conjunctivae normal ENMT: Mucous membranes are dry. Neck: normal, supple, no masses, no thyromegaly Respiratory: bilateral decreased breath sounds at bases, no wheezing; some scattered crackles. Normal respiratory effort. No accessory muscle use.  Cardiovascular: S1 S2 positive, rate controlled.  Trace lower extremity edema. 2+ pedal pulses.  Edematous upper extremities as well Abdomen: no tenderness, no masses palpated. No hepatosplenomegaly. Bowel sounds positive.  Musculoskeletal: no clubbing / cyanosis. No joint deformity upper and lower extremities.  Skin: Ecchymosis in bilateral upper activities Neurologic: Very drowsy, hardly wakes up on calling her name.  No obvious focal neurological deficit Psychiatric: Unable to assess because of current mental status   Data reviewed:  I have personally reviewed following labs and imaging studies Labs:  CBC: Recent Labs  Lab 12/18/17 2031 12/24/17 0548 12/25/17 0458  WBC 12.7* 10.5 12.7*  NEUTROABS  --  7.3 9.1*  HGB 14.1 10.4* 10.9*  HCT 41.9 30.7* 33.8*  MCV 89.0 88.2 88.5  PLT 213 177 097    Basic Metabolic Panel: Recent Labs  Lab 12/24/17 0548 12/24/17 1227 12/24/17 1823 12/25/17 0458  NA 132*  --  133* 132*  K 2.7*  --  3.4* 3.5  CL 100  --  96* 95*  CO2 25  --  30 28  GLUCOSE 105*  --  102* 98  BUN 11  --  11 12  CREATININE 0.67  --  0.60 0.65  CALCIUM 8.4*  --  8.9 8.8*  MG  --  1.5*  --   --    GFR Estimated Creatinine Clearance: 51 mL/min (by C-G formula based on SCr of 0.65 mg/dL). Liver Function Tests: Recent Labs  Lab 12/24/17 0548  AST 35  ALT 36  ALKPHOS 61  BILITOT 1.5*  PROT 6.0*  ALBUMIN 2.8*   No results for input(s): LIPASE, AMYLASE in the last 168 hours. No results for input(s): AMMONIA in the last 168 hours. Coagulation profile Recent Labs  Lab  12/18/17 2031  INR 1.19    Cardiac Enzymes: No results for input(s): CKTOTAL, CKMB, CKMBINDEX, TROPONINI in the last 168 hours. BNP: Invalid input(s): POCBNP CBG: Recent Labs  Lab 12/24/17 0629 12/24/17 1214  GLUCAP 111* 128*   D-Dimer No results for input(s): DDIMER in the last 72 hours. Hgb A1c No results for input(s): HGBA1C  in the last 72 hours. Lipid Profile No results for input(s): CHOL, HDL, LDLCALC, TRIG, CHOLHDL, LDLDIRECT in the last 72 hours. Thyroid function studies No results for input(s): TSH, T4TOTAL, T3FREE, THYROIDAB in the last 72 hours.  Invalid input(s): FREET3 Anemia work up No results for input(s): VITAMINB12, FOLATE, FERRITIN, TIBC, IRON, RETICCTPCT in the last 72 hours. Urinalysis    Component Value Date/Time   COLORURINE YELLOW 12/25/2017 1301   APPEARANCEUR CLOUDY (A) 12/25/2017 1301   LABSPEC 1.014 12/25/2017 1301   PHURINE 8.0 12/25/2017 1301   GLUCOSEU NEGATIVE 12/25/2017 1301   HGBUR SMALL (A) 12/25/2017 1301   BILIRUBINUR NEGATIVE 12/25/2017 1301   KETONESUR 20 (A) 12/25/2017 1301   PROTEINUR NEGATIVE 12/25/2017 1301   UROBILINOGEN 0.2 08/17/2012 1829   NITRITE NEGATIVE 12/25/2017 1301   LEUKOCYTESUR NEGATIVE 12/25/2017 1301     Microbiology Recent Results (from the past 240 hour(s))  Culture, Urine     Status: Abnormal   Collection Time: 12/18/17  2:18 PM  Result Value Ref Range Status   Specimen Description URINE, RANDOM  Final   Special Requests   Final    NONE Performed at [facility], [place] [address] [place], [place] [address]    Culture >=100,000 COLONIES/mL ESCHERICHIA COLI (A)  Final   Report Status 12/21/2017 FINAL  Final   Organism ID, Bacteria ESCHERICHIA COLI (A)  Final      Susceptibility   Escherichia coli - MIC*    AMPICILLIN 8 SENSITIVE Sensitive     CEFAZOLIN <=4 SENSITIVE Sensitive     CEFTRIAXONE <=1 SENSITIVE Sensitive     CIPROFLOXACIN <=0.25 SENSITIVE Sensitive     GENTAMICIN 4 SENSITIVE  Sensitive     IMIPENEM <=0.25 SENSITIVE Sensitive     NITROFURANTOIN <=16 SENSITIVE Sensitive     TRIMETH/SULFA <=20 SENSITIVE Sensitive     AMPICILLIN/SULBACTAM <=2 SENSITIVE Sensitive     PIP/TAZO <=4 SENSITIVE Sensitive     Extended ESBL NEGATIVE Sensitive     * >=100,000 COLONIES/mL ESCHERICHIA COLI  MRSA PCR Screening     Status: None   Collection Time: 12/18/17  8:07 PM  Result Value Ref Range Status   MRSA by PCR NEGATIVE NEGATIVE Final    Comment:        The GeneXpert MRSA Assay (FDA approved for NASAL specimens only), is one component of a comprehensive MRSA colonization surveillance program. It is not intended to diagnose MRSA infection nor to guide or monitor treatment for MRSA infections. Performed at [facility], [place] [address], [place], [place] [address]        Inpatient Medications:   Scheduled Meds: . calcium carbonate  1 tablet Oral Q breakfast  . cholecalciferol  1,000 Units Oral Daily  . docusate sodium  100 mg Oral BID  . feeding supplement (PRO-STAT SUGAR FREE 64)  30 mL Oral BID  . irbesartan  150 mg Oral Daily  . levETIRAcetam  500 mg Oral BID  . magnesium oxide  400 mg Oral BID  . metoprolol tartrate  25 mg Oral BID  . pantoprazole  40 mg Oral Daily  . polyethylene glycol  17 g Oral Daily  . [START ON 12/26/2017] potassium chloride  20 mEq Oral BID  . tamsulosin  0.4 mg Oral Daily  . vitamin B-12  1,000 mcg Oral Daily  . vitamin C  500 mg Oral Daily  . vitamin E  400 Units Oral Daily   Continuous Infusions: . 0.9 % NaCl with KCl  20 mEq / L 100 mL/hr at 12/25/17 1603     Radiological Exams on Admission: Ct Head Wo Contrast  Result Date: 12/25/2017 CLINICAL DATA:  Follow-up hemorrhage. EXAM: CT HEAD WITHOUT CONTRAST TECHNIQUE: Contiguous axial images were obtained from the base of the skull through the vertex without intravenous contrast. COMPARISON:  12/18/2017 FINDINGS: Brain: A large hemorrhage following the right lateral ventricle  has decreased in bulk, when remeasured at the level of the lateral ventricle it is 2.4 cm in diameter compared to 2.9 cm previously. Small volume blood layering in the occipital horn of left lateral ventricle is unchanged. There is increased edema about the hematoma, best seen at the level of the right temporal lobe. This low-density does not involve gray matter to clearly suggest an underlying infarct. The septum pellucidum is displaced by blood clot but there is no generalized midline shift. Generalized atrophy. Vascular: Negative Skull: No acute finding. Sinuses/Orbits: No acute finding IMPRESSION: 1. Interval mild contraction of clot in the right temporal lobe and right more than left lateral ventricles. 2. No new/progressive hemorrhage. 3. Increased vasogenic edema in the right temporal lobe. Electronically Signed   By: Monte Fantasia M.D.   On: 12/25/2017 16:01   Dg Chest Port 1 View  Result Date: 12/25/2017 CLINICAL DATA:  Fever. EXAM: PORTABLE CHEST 1 VIEW COMPARISON:  04/10/2015 FINDINGS: Cardiac silhouette is mildly enlarged. No mediastinal or hilar masses. There is no evidence of adenopathy. Are prominent bronchovascular markings most evident the bases. Additional streaky opacities noted at the left lung base, likely atelectasis. No convincing pneumonia. No pulmonary edema. No pleural effusion or pneumothorax. Skeletal structures are demineralized but grossly intact. IMPRESSION: No acute cardiopulmonary disease. Electronically Signed   By: Lajean Manes M.D.   On: 12/25/2017 13:27    Impression/Recommendations Active Problems:   Intraventricular hemorrhage (HCC)   ICH (intracerebral hemorrhage) (HCC)   Acute blood loss anemia   Hypoalbuminemia due to protein-calorie malnutrition (HCC)  Fever with lethargy -Questionable cause.  Patient has been started on broad-spectrum antibiotics by the primary team.  Continue antibiotics for now.  Follow cultures.  Chest x-ray is negative for any  infiltrates.  UA is unremarkable.  Patient has recently completed antibiotic treatment for E. coli UTI -Unclear if the patient is aspirating and chest x-ray has not shown infiltrated.  Monitor respiratory status.  Might need SLP evaluation -CT of the head shows no new bleeding but increased vasogenic edema in the right temporal lobe.  Recommend neurosurgery evaluation -Might consider neurology evaluation in case patient is having nonconvulsive seizures. -Patient was started on Topamax recently which can give drowsiness and fever.  Topamax has been discontinued today -Keppra can cause lethargy.  Need for Keppra needs to be confirmed from neurosurgery. -No signs of thrombophlebitis -No calf swelling to indicate DVT -If patient continues to spike temperature: Might need further imaging studies including CT of the chest, abdomen and pelvis -Consider central fever if antibiotics do not help -Will check LFTs in a.m. as well -If condition does not improve, consider palliative care evaluation  Leukocytosis -Antibiotic plan as above.  Repeat a.m. labs  Recent E. coli UTI -Status post completion of antibiotic treatment.  E. coli was pansensitive  Recent intraventricular hemorrhage -Conservatively treated.  Recommend neurosurgery evaluation for lethargy and worsening right temporal lobe edema seen on CAT scan  Hypertension -Monitor blood pressure.  Continue irbesartan and metoprolol  Chronic atrial fibrillation -Rate controlled.  Continue metoprolol.  Off Xarelto since recent intraventricular hemorrhage  Thank you for this consultation.  Our [facility] hospitalist team will follow the patient with you.    Aline August M.D. [facility] Pager: [phone] 12/25/2017, 5:37 PM

## 2017-12-25 NOTE — Progress Notes (Signed)
Physical Therapy Session Note  Patient Details  Name: [name] MRN: [id number] Date of Birth: 05/24/1926  Today's Date: 12/25/2017 PT Individual Time:900-945   45 min  Short Term Goals: Week 1:  PT Short Term Goal 1 (Week 1): Pt will perform bed mobility with max assist of 1 consistently  PT Short Term Goal 2 (Week 1): Pt will sit EOB x 3 min with mod assist  PT Short Term Goal 3 (Week 1): Pt will perform bed<>WC transfers with max assist  PT Short Term Goal 4 (Week 1): Pt will tolerate sitting in WC for up to 2 hours to prepare for out of bed mobility   Skilled Therapeutic Interventions/Progress Updates:   Per PA pt has been cleared for all mobility as tolerated due to improve K+ values. Pt received supine in bed and agreeable to PT after multiple attempts to arouse pt. PT assessed Orthostatic vital signs assess. Supine: 170/75. Sitting 0 min: 152/97. Sitting 5 min 149/68. Pt reports strong sx of orthostatic hypotension. Supine sit with +2 total assist. Sitting balance EOB x 20 min for RN to administer medication. Max-total assist with mild pushing tendencies to maintain upright posture. Pt unable to initiate any righting reactions through trunk and only occassional through head/neck. No eye opening noted on this day, regardless of cues from PT. Pt reports nausea after sitting x 5mn and PtT returned to bed with total +2 transfer. Pt left supine in bed with all needs. Met.     Therapy Documentation Precautions:  Precautions Precautions: Fall Precaution Comments: pushing left Restrictions Weight Bearing Restrictions: No General:   missed time: 45 min( lethargy) Vital Signs: Therapy Vitals Pulse Rate: 76 BP: (!) 170/75 Pain: Pain Assessment Pain Scale: Faces Pain Score: 5     Therapy/Group: Individual Therapy  ALorie Phenix11/09/2017, 9:57 AM

## 2017-12-25 NOTE — Plan of Care (Signed)
Patient lethargic and unable to participate in care

## 2017-12-25 NOTE — Progress Notes (Signed)
[facility] Individual Statement of Services  Patient Name:  [name]  Date:  12/25/2017  Welcome to the [facility].  Our goal is to provide you with an individualized program based on your diagnosis and situation, designed to meet your specific needs.  With this comprehensive rehabilitation program, you will be expected to participate in at least 3 hours of rehabilitation therapies Monday-Friday, with modified therapy programming on the weekends.  Your rehabilitation program will include the following services:  Physical Therapy (PT), Occupational Therapy (OT), Speech Therapy (ST), 24 hour per day rehabilitation nursing, Neuropsychology, Case Management (Social Worker), Rehabilitation Medicine, Nutrition Services and Pharmacy Services  Weekly team conferences will be held on Wednesdays to discuss your progress.  Your Social Worker will talk with you frequently to get your input and to update you on team discussions.  Team conferences with you and your family in attendance may also be held.  Expected length of stay:  3 to 4 weeks  Overall anticipated outcome:  Minimal assistance  Depending on your progress and recovery, your program may change. Your Social Worker will coordinate services and will keep you informed of any changes. Your Social Worker's name and contact numbers are listed  below.  The following services may also be recommended but are not provided by the [facility] will be made to provide these services after discharge if needed.  Arrangements include referral to agencies that provide these services.  Your insurance has been verified to be:  Medicare and [employer] Your primary doctor is:  Dr. Merrilee Seashore  Pertinent information will be shared with your doctor and your  insurance company.  Social Worker:  Alfonse Alpers, LCSW  [phone] or ([phone]  Information discussed with and copy given to patient by: Trey Sailors, 12/25/2017, 10:56 PM

## 2017-12-25 NOTE — Progress Notes (Signed)
Consulted hospitalist to assist with management of patient. Contact NS and films reviewed by NP--mild increase in edema likely not the cause of lethargy and to work patient up for alternative cause. No surgical intervention needed.

## 2017-12-25 NOTE — Progress Notes (Signed)
Speech Language Pathology Daily Session Note  Patient Details  Name: [name] MRN: [id number] Date of Birth: 05/19/1926  Today's Date: 12/25/2017 SLP Individual Time: 4235-3614 SLP Individual Time Calculation (min): 28 min  Short Term Goals: Week 1: SLP Short Term Goal 1 (Week 1): Pt will consume dys 2 textures and thin liquids with min cues for use of swallowing precautions and minimal overt s/s of aspiration.  SLP Short Term Goal 2 (Week 1): Pt will sustain her attention to basic, functional tasks for 5 minutes with mod cues for redirection.   SLP Short Term Goal 3 (Week 1): Pt will complete basic, familiar tasks with mod assist for functional problem solving.   SLP Short Term Goal 4 (Week 1): Pt will locate items to the left of midline in ~50% of opportunities during basic, familiar tasks with mod assist.    Skilled Therapeutic Interventions:  Pt was seen for skilled ST targeting cognitive goals.  Pt asleep upon arrival and very difficult to arouse.  Pt had been incontinent of a small amount of loose stool.  Total assist of 2 needed for bed mobility due to left sided deficits, pain with movement, rigidity, and somnolence.  Even with rolling and repositioning to change brief, pt remained minimally arousable.  Changing the environment (turning on lights, opening the blinds, sitting upright in bed) and cold compress were also ineffective for maximizing alertness.  As a result session was ended early due to somnolence.  RN made aware.  Pt left in bed with bed alarm set and call bell within reach.  Continue per current plan of care.    Pain Pain Assessment Pain Score: Asleep  Therapy/Group: Individual Therapy  Zia Najera, Selinda Orion 12/25/2017, 8:32 AM

## 2017-12-25 NOTE — Progress Notes (Signed)
[facility] PHYSICAL MEDICINE & REHABILITATION PROGRESS NOTE  Subjective/Complaints: Pt with increased lethargy, low grade temp.   ROS: Limited due to cognitive/behavioral    Objective: Vital Signs: Blood pressure (!) 153/76, pulse 79, temperature (!) 102.1 F (38.9 C), temperature source Rectal, resp. rate 18, height 5\' 5"  (1.651 m), weight 84.1 kg, SpO2 95 %. Dg Chest Port 1 View  Result Date: 12/25/2017 CLINICAL DATA:  Fever. EXAM: PORTABLE CHEST 1 VIEW COMPARISON:  04/10/2015 FINDINGS: Cardiac silhouette is mildly enlarged. No mediastinal or hilar masses. There is no evidence of adenopathy. Are prominent bronchovascular markings most evident the bases. Additional streaky opacities noted at the left lung base, likely atelectasis. No convincing pneumonia. No pulmonary edema. No pleural effusion or pneumothorax. Skeletal structures are demineralized but grossly intact. IMPRESSION: No acute cardiopulmonary disease. Electronically Signed   By: Lajean Manes M.D.   On: 12/25/2017 13:27   Recent Labs    12/24/17 0548 12/25/17 0458  WBC 10.5 12.7*  HGB 10.4* 10.9*  HCT 30.7* 33.8*  PLT 177 230   Recent Labs    12/24/17 1823 12/25/17 0458  NA 133* 132*  K 3.4* 3.5  CL 96* 95*  CO2 30 28  GLUCOSE 102* 98  BUN 11 12  CREATININE 0.60 0.65  CALCIUM 8.9 8.8*    Physical Exam: BP (!) 153/76 (BP Location: Left Arm)   Pulse 79   Temp (!) 102.1 F (38.9 C) (Rectal)   Resp 18   Ht 5\' 5"  (1.651 m)   Wt 84.1 kg   SpO2 95%   BMI 30.85 kg/m  Constitutional: No distress . Vital signs reviewed. HEENT: EOMI, oral membranes moist Neck: supple Cardiovascular: IRRwithout murmur. No JVD    Respiratory: CTA Bilaterally without wheezes or rales. Normal effort    GI: BS +, non-tender, non-distended  Musculoskeletal:  Edema most significant in right upper extremity  Neurological:  Lethargic.  Oriented x2. BUE tremors noted.  Dysarthria  Motor: Limited due to participation RUE: 4/5  proximal to distal RLE: 2/5 proximal to distal (?effort) LUE: 2/5 proximal to distal (?effort) LLE: 3-/5 proximal to distal   Skin: Skin is warm and dry.  BUE with diffuse ecchymotic areas.   Psychiatric: Unable to assess due to mentation   Assessment/Plan: 1. Functional deficits secondary to right intraventricular hemorrhage which require 3+ hours per day of interdisciplinary therapy in a comprehensive inpatient rehab setting.  Physiatrist is providing close team supervision and 24 hour management of active medical problems listed below.  Physiatrist and rehab team continue to assess barriers to discharge/monitor patient progress toward functional and medical goals  Care Tool:  Bathing    Body parts bathed by patient: Face   Body parts bathed by helper: Right arm, Left arm, Chest, Abdomen, Front perineal area, Buttocks     Bathing assist Assist Level: 2 Helpers     Upper Body Dressing/Undressing Upper body dressing   What is the patient wearing?: Hospital gown only    Upper body assist Assist Level: Total Assistance - Patient < 25%    Lower Body Dressing/Undressing Lower body dressing      What is the patient wearing?: Incontinence brief     Lower body assist Assist for lower body dressing: 2 Helpers     Toileting Toileting    Toileting assist Assist for toileting: Dependent - Patient 0%     Transfers Chair/bed transfer  Transfers assist  Chair/bed transfer activity did not occur: Safety/medical concerns  Locomotion Ambulation   Ambulation assist   Ambulation activity did not occur: Safety/medical concerns          Walk 10 feet activity   Assist  Walk 10 feet activity did not occur: Safety/medical concerns        Walk 50 feet activity   Assist Walk 50 feet with 2 turns activity did not occur: Safety/medical concerns         Walk 150 feet activity   Assist Walk 150 feet activity did not occur: Safety/medical concerns          Walk 10 feet on uneven surface  activity   Assist Walk 10 feet on uneven surfaces activity did not occur: Safety/medical concerns         Wheelchair     Assist     Wheelchair activity did not occur: Safety/medical concerns         Wheelchair 50 feet with 2 turns activity    Assist    Wheelchair 50 feet with 2 turns activity did not occur: Safety/medical concerns       Wheelchair 150 feet activity     Assist Wheelchair 150 feet activity did not occur: Safety/medical concerns          Medical Problem List and Plan: 1.  Visual field deficits with gaze preference, visual hallucinations, difficulty keeping eyes open,  pusher tendencies and left bias with standing secondary to large intraventricular hemorrhage   -increased lethargy, temp   -re-check labs, urine, cxr   -check head CT   -supportive care   -resume abx 2.  DVT Prophylaxis/Anticoagulation: Mechanical: Sequential compression devices, below knee Bilateral lower extremities 3. Headaches/Pain Management: Limit sedating medications. Started Topamax at bedtime and limit hydrocodone as needed for severe HA.  4. Mood: LCSW to follow for evaluation and support when appropriate. Resume home ativan prn at lower dose.  5. Neuropsych: This patient is not capable of making decisions on her own behalf 6. Skin/Wound Care: Routine pressure relief measures 7. Fluids/Electrolytes/Nutrition: Monitor I's and O's.   8.  E. coli UTI: Completed 7 days of ctx/amoxicillin 9.  HTN: Blood pressure goals less than 160/90.  Continue metoprolol twice daily and increased Avapro to 150 mg for blood pressure control.  Monitor with increased mobility 10.  Seizure prophylaxis: Continue Keppra twice daily. 11.  Urinary retention: Discontinued Ditropan.  Continue Flomax routine pressure relief measures. 12.  Leukocytosis: Resolved  WBCs 10.5 and 11/7---up to 12.7 today  Continue to monitor 13. Hyponatremia:   Sodium 132  on 11/7--132 again today 11/8  Continue to monitor 14. H/o Barrett's esophagus/GERD: On Protonix 15.  Hypokalemia  Critical value of 2.7 on 11/7  -increased to 3.5 today   -continue supp 16.  Hypoalbuminemia  Supplement initiated on 11/7 17.  Acute blood loss anemia  Hemoglobin 10.4 on 11/7---up to 10.9 11/8    LOS: 2 days A FACE TO FACE EVALUATION WAS PERFORMED  Meredith Staggers 12/25/2017, 1:39 PM

## 2017-12-25 NOTE — Progress Notes (Signed)
Pharmacy Antibiotic Note  [name] is a 84 y.o. female transferred to [facility] on 12/23/2017 after a recent intraventricular hemorrhage. Pt has neem spiking fevers today and pt is continued to be lethargic, concerning for sepsis. Pharmacy has been consulted for vancomycin and zosyn dosing. Tm 102.2, wbc 12.7 trending up, crcl ~ 50 ml/min, urine and bleed cultures are obtained today.  Plan: - Vancomycin 750 mg IV Q 12 hrs - zosyn 3.375g IV Q8 hrs - monitor renal function and f/u cultures  Height: 5\' 5"  (165.1 cm) Weight: 185 lb 6.5 oz (84.1 kg) IBW/kg (Calculated) : 57  Temp (24hrs), Avg:100.5 F (38.1 C), Min:97.7 F (36.5 C), Max:102.2 F (39 C)  Recent Labs  Lab 12/18/17 2031 12/24/17 0548 12/24/17 1823 12/25/17 0458  WBC 12.7* 10.5  --  12.7*  CREATININE  --  0.67 0.60 0.65    Estimated Creatinine Clearance: 51 mL/min (by C-G formula based on SCr of 0.65 mg/dL).    Allergies  Allergen Reactions  . Amlodipine Shortness Of Breath and Swelling    Edema in legs  . Methyldopa Shortness Of Breath and Swelling  . Actonel [Risedronate Sodium] Other (See Comments)    Caused a lump to come up in throat  . Ciprofloxacin Hcl     hallucinations  . Hctz [Hydrochlorothiazide] Itching    Pt reports causing "itching under the skin, with no rash noted."  . Hydralazine Hcl     Unknown, per pt   . Lisinopril Cough  . Oxycodone Hcl     hallucinations  . Spironolactone     Itchiing  . Tape Itching  . Valsartan     Unknown, per pt    Antimicrobials this admission: Vancomycin 11/8 >>  Zosyn 11/8 >> Rocephin 11/6 x 1, 11/8 x1 Keflex 11/6 >> 11/7  Dose adjustments this admission:   Microbiology results: 11/8 BCx x 2:  11/8 UCx:   11/1 urine - pan sens e.coli  Thank you for allowing pharmacy to be a part of this patient's care.  Maryanna Shape, PharmD, BCPS, BCPPS Clinical Pharmacist  Pager: [phone]   12/25/2017 5:38 PM

## 2017-12-25 NOTE — Progress Notes (Signed)
SLP Cancellation Note  Patient Details Name: [name] MRN: [id number] DOB: [birth year]   Cancelled treatment:        Attempted to see pt to make up minutes missed from earlier this morning.  Pt is now up in tilt in space wheelchair but remains extremely lethargic.  Would not open her eyes and would only grunt in response the therapist's attempts to wake her.  Will continue efforts to see pt for treatment.                                                                                                  Sundus Pete, Selinda Orion 12/25/2017, 11:19 AM

## 2017-12-26 ENCOUNTER — Inpatient Hospital Stay (HOSPITAL_COMMUNITY): Payer: Medicare Other | Admitting: Physical Therapy

## 2017-12-26 ENCOUNTER — Inpatient Hospital Stay (HOSPITAL_COMMUNITY): Payer: Medicare Other

## 2017-12-26 DIAGNOSIS — I69398 Other sequelae of cerebral infarction: Secondary | ICD-10-CM

## 2017-12-26 DIAGNOSIS — R5383 Other fatigue: Secondary | ICD-10-CM

## 2017-12-26 DIAGNOSIS — R209 Unspecified disturbances of skin sensation: Secondary | ICD-10-CM

## 2017-12-26 DIAGNOSIS — R509 Fever, unspecified: Secondary | ICD-10-CM

## 2017-12-26 LAB — COMPREHENSIVE METABOLIC PANEL
ALT: 27 U/L (ref 0–44)
ANION GAP: 9 (ref 5–15)
AST: 32 U/L (ref 15–41)
Albumin: 2.5 g/dL — ABNORMAL LOW (ref 3.5–5.0)
Alkaline Phosphatase: 64 U/L (ref 38–126)
BUN: 16 mg/dL (ref 8–23)
CHLORIDE: 97 mmol/L — AB (ref 98–111)
CO2: 23 mmol/L (ref 22–32)
Calcium: 8.4 mg/dL — ABNORMAL LOW (ref 8.9–10.3)
Creatinine, Ser: 0.84 mg/dL (ref 0.44–1.00)
GFR, EST NON AFRICAN AMERICAN: 60 mL/min — AB (ref >60)
Glucose, Bld: 158 mg/dL — ABNORMAL HIGH (ref 70–99)
POTASSIUM: 2.9 mmol/L — AB (ref 3.5–5.1)
SODIUM: 129 mmol/L — AB (ref 135–145)
Total Bilirubin: 1.4 mg/dL — ABNORMAL HIGH (ref 0.3–1.2)
Total Protein: 5.8 g/dL — ABNORMAL LOW (ref 6.5–8.1)

## 2017-12-26 LAB — CBC
HCT: 30.4 % — ABNORMAL LOW (ref 36.0–46.0)
Hemoglobin: 10.4 g/dL — ABNORMAL LOW (ref 12.0–15.0)
MCH: 29.8 pg (ref 26.0–34.0)
MCHC: 34.2 g/dL (ref 30.0–36.0)
MCV: 87.1 fL (ref 80.0–100.0)
NRBC: 0 % (ref 0.0–0.2)
PLATELETS: 203 10*3/uL (ref 150–400)
RBC: 3.49 MIL/uL — ABNORMAL LOW (ref 3.87–5.11)
RDW: 13.2 % (ref 11.5–15.5)
WBC: 12.8 10*3/uL — ABNORMAL HIGH (ref 4.0–10.5)

## 2017-12-26 LAB — URINE CULTURE: CULTURE: NO GROWTH

## 2017-12-26 LAB — AMMONIA: AMMONIA: 36 umol/L — AB (ref 9–35)

## 2017-12-26 LAB — TSH: TSH: 2.265 u[IU]/mL (ref 0.350–4.500)

## 2017-12-26 LAB — VITAMIN B12: VITAMIN B 12: 2346 pg/mL — AB (ref 180–914)

## 2017-12-26 MED ORDER — ORAL CARE MOUTH RINSE
15.0000 mL | Freq: Two times a day (BID) | OROMUCOSAL | Status: DC
  Administered 2017-12-26 – 2018-01-17 (×44): 15 mL via OROMUCOSAL

## 2017-12-26 MED ORDER — POTASSIUM CHLORIDE IN NACL 40-0.9 MEQ/L-% IV SOLN
INTRAVENOUS | Status: DC
  Administered 2017-12-26: 75 mL/h via INTRAVENOUS
  Administered 2017-12-27: 50 mL/h via INTRAVENOUS
  Administered 2017-12-27: 75 mL/h via INTRAVENOUS
  Administered 2017-12-28: 50 mL/h via INTRAVENOUS
  Filled 2017-12-26 (×5): qty 1000

## 2017-12-26 NOTE — Progress Notes (Signed)
Patient drowsy off and on today but waking up to eat and able to take medications this morning without any coughing. Patient alert to self, place and situation. Patient foley in place and appropriate. Patient following simple commands and interacting with family at bedside. Temp back WNL this afternoon. Continue to monitor.

## 2017-12-26 NOTE — Progress Notes (Signed)
[facility] PHYSICAL MEDICINE & REHABILITATION PROGRESS NOTE  Subjective/Complaints: Appreciate hospitalist note, neurosurgery reviewed CT head , mild interval increase in cerebral edema not felt to be causing lethargy Pt is awake and follows basic commands Tells me "I can't move my legs because of the squeeze things (SCD)" ROS: Limited due to cognitive/behavioral    Objective: Vital Signs: Blood pressure (!) 152/70, pulse 83, temperature (!) 100.4 F (38 C), temperature source Oral, resp. rate 16, height 5\' 5"  (1.651 m), weight 84.1 kg, SpO2 93 %. Ct Head Wo Contrast  Result Date: 12/25/2017 CLINICAL DATA:  Follow-up hemorrhage. EXAM: CT HEAD WITHOUT CONTRAST TECHNIQUE: Contiguous axial images were obtained from the base of the skull through the vertex without intravenous contrast. COMPARISON:  12/18/2017 FINDINGS: Brain: A large hemorrhage following the right lateral ventricle has decreased in bulk, when remeasured at the level of the lateral ventricle it is 2.4 cm in diameter compared to 2.9 cm previously. Small volume blood layering in the occipital horn of left lateral ventricle is unchanged. There is increased edema about the hematoma, best seen at the level of the right temporal lobe. This low-density does not involve gray matter to clearly suggest an underlying infarct. The septum pellucidum is displaced by blood clot but there is no generalized midline shift. Generalized atrophy. Vascular: Negative Skull: No acute finding. Sinuses/Orbits: No acute finding IMPRESSION: 1. Interval mild contraction of clot in the right temporal lobe and right more than left lateral ventricles. 2. No new/progressive hemorrhage. 3. Increased vasogenic edema in the right temporal lobe. Electronically Signed   By: Monte Fantasia M.D.   On: 12/25/2017 16:01   Dg Chest Port 1 View  Result Date: 12/25/2017 CLINICAL DATA:  Fever. EXAM: PORTABLE CHEST 1 VIEW COMPARISON:  04/10/2015 FINDINGS: Cardiac silhouette is  mildly enlarged. No mediastinal or hilar masses. There is no evidence of adenopathy. Are prominent bronchovascular markings most evident the bases. Additional streaky opacities noted at the left lung base, likely atelectasis. No convincing pneumonia. No pulmonary edema. No pleural effusion or pneumothorax. Skeletal structures are demineralized but grossly intact. IMPRESSION: No acute cardiopulmonary disease. Electronically Signed   By: Lajean Manes M.D.   On: 12/25/2017 13:27   Recent Labs    12/24/17 0548 12/25/17 0458  WBC 10.5 12.7*  HGB 10.4* 10.9*  HCT 30.7* 33.8*  PLT 177 230   Recent Labs    12/24/17 1823 12/25/17 0458  NA 133* 132*  K 3.4* 3.5  CL 96* 95*  CO2 30 28  GLUCOSE 102* 98  BUN 11 12  CREATININE 0.60 0.65  CALCIUM 8.9 8.8*    Physical Exam: BP (!) 152/70 (BP Location: Right Arm)   Pulse 83   Temp (!) 100.4 F (38 C) (Oral)   Resp 16   Ht 5\' 5"  (1.651 m)   Wt 84.1 kg   SpO2 93%   BMI 30.85 kg/m  Constitutional: No distress . Vital signs reviewed. HEENT: EOMI, oral membranes moist Neck: supple Cardiovascular: IRRwithout murmur. No JVD    Respiratory: CTA Bilaterally without wheezes or rales. Normal effort    GI: BS +, non-tender, non-distended  Musculoskeletal:  Edema most significant in right upper extremity  Neurological:  Lethargic.  Oriented x2. BUE tremors noted.  Dysarthria  Motor: Limited due to participation RUE: 4/5 proximal to distal RLE: 2/5 proximal to distal (?effort) LUE: 2/5 proximal to distal (?effort) LLE: 2-/5 proximal to distal   Sensation intact LT RUE and RLE, absent LT LUE and LLE  Skin: Skin is warm and dry.  BUE with diffuse ecchymotic areas.   Psychiatric: Unable to assess due to mentation   Assessment/Plan: 1. Functional deficits secondary to right intraventricular hemorrhage which require 3+ hours per day of interdisciplinary therapy in a comprehensive inpatient rehab setting.  Physiatrist is providing close team  supervision and 24 hour management of active medical problems listed below.  Physiatrist and rehab team continue to assess barriers to discharge/monitor patient progress toward functional and medical goals  Care Tool:  Bathing    Body parts bathed by patient: Face   Body parts bathed by helper: Right arm, Left arm, Chest, Abdomen, Front perineal area, Buttocks     Bathing assist Assist Level: 2 Helpers     Upper Body Dressing/Undressing Upper body dressing   What is the patient wearing?: Hospital gown only    Upper body assist Assist Level: 2 Helpers    Lower Body Dressing/Undressing Lower body dressing      What is the patient wearing?: Hospital gown only     Lower body assist Assist for lower body dressing: 2 Helpers     Toileting Toileting    Toileting assist Assist for toileting: 2 Helpers     Transfers Chair/bed transfer  Transfers assist  Chair/bed transfer activity did not occur: Safety/medical concerns  Chair/bed transfer assist level: Dependent - mechanical lift     Locomotion Ambulation   Ambulation assist   Ambulation activity did not occur: Safety/medical concerns          Walk 10 feet activity   Assist  Walk 10 feet activity did not occur: Safety/medical concerns        Walk 50 feet activity   Assist Walk 50 feet with 2 turns activity did not occur: Safety/medical concerns         Walk 150 feet activity   Assist Walk 150 feet activity did not occur: Safety/medical concerns         Walk 10 feet on uneven surface  activity   Assist Walk 10 feet on uneven surfaces activity did not occur: Safety/medical concerns         Wheelchair     Assist     Wheelchair activity did not occur: Safety/medical concerns         Wheelchair 50 feet with 2 turns activity    Assist    Wheelchair 50 feet with 2 turns activity did not occur: Safety/medical concerns       Wheelchair 150 feet activity      Assist Wheelchair 150 feet activity did not occur: Safety/medical concerns          Medical Problem List and Plan: 1.  Visual field deficits with gaze preference, visual hallucinations, difficulty keeping eyes open,  pusher tendencies and left bias with standing secondary to large intraventricular hemorrhage   -increased lethargy, temp- taking fluids now per RN, more alert   -per IM,    - head CT reduce size of IVH, mild increase vasogenic edema   -supportive care   -resume abx 2.  DVT Prophylaxis/Anticoagulation: Mechanical: Sequential compression devices, below knee Bilateral lower extremities 3. Headaches/Pain Management: Limit sedating medications. Started Topamax at bedtime and limit hydrocodone as needed for severe HA.  4. Mood: LCSW to follow for evaluation and support when appropriate. Resume home ativan prn at lower dose.  5. Neuropsych: This patient is not capable of making decisions on her own behalf 6. Skin/Wound Care: Routine pressure relief measures 7. Fluids/Electrolytes/Nutrition: Monitor I's and  O's.   8.  E. coli UTI: Completed 7 days of ctx/amoxicillin 9.  HTN: Blood pressure goals less than 160/90.  Continue metoprolol twice daily and increased Avapro to 150 mg for blood pressure control.  Monitor with increased mobility 10.  Seizure prophylaxis: Continue Keppra twice daily. 11.  Urinary retention: Discontinued Ditropan.  Continue Flomax routine pressure relief measures. 12.  Leukocytosis: Resolved  WBCs 10.5 and 11/7---up to 12.7 today  Continue to monitor 13. Hyponatremia:   Sodium 132 on 11/7--132  11/8, 129 on 11/9 on .9NS IVF- per IM  Continue to monitor 14. H/o Barrett's esophagus/GERD: On Protonix 15.  Hypokalemia  Critical value of 2.7 on 11/7, up to 2.9- per IM   -continue supp 16.  Hypoalbuminemia  Supplement initiated on 11/7 17.  Acute blood loss anemia  Hemoglobin 10.4 on 11/7---up to 10.9 11/8    LOS: 3 days A FACE TO FACE  EVALUATION WAS PERFORMED  Charlett Blake 12/26/2017, 7:23 AM

## 2017-12-26 NOTE — Progress Notes (Signed)
Occupational Therapy Note  Patient Details  Name: KIAYA HALIBURTON MRN: [id number] Date of Birth: [birth year]  Today's Date: 12/26/2017 OT Missed Time: 25 Minutes Missed Time Reason: MD hold (comment)  Pt on bed rest this date therefore missing 60 min skilled OT. Will follow up when appropriate.   Lowella Dell Dia Jefferys 12/26/2017, 4:28 PM

## 2017-12-26 NOTE — Progress Notes (Signed)
Social Work Assessment and Plan Social Work Assessment and Plan  Patient Details  Name: [name] MRN: [id number] Date of Birth: 07-20-1926  Today's Date: 12/26/2017  Problem List:  Patient Active Problem List   Diagnosis Date Noted  . Acute blood loss anemia   . Hypoalbuminemia due to protein-calorie malnutrition ([place])   . ICH (intracerebral hemorrhage) ([place]) 12/23/2017  . History of Barrett's esophagus   . Urinary retention   . Seizure prophylaxis   . E. coli UTI   . Intraventricular hemorrhage ([place])   . Urinary tract infection without hematuria   . Coronary artery disease involving native coronary artery of native heart without angina pectoris   . Pulmonary HTN ([place])   . Leukocytosis   . Intracranial hemorrhage ([place]) 12/18/2017  . Syncope 04/10/2015  . Sepsis secondary to UTI ([place]) 04/10/2015  . URI (upper respiratory infection) 04/10/2015  . UTI (lower urinary tract infection) 04/10/2015  . QT prolongation 10/12/2014  . Headache 09/02/2012  . Tricuspid valve regurgitation, moderate 08/18/2012  . Hypokalemia 08/16/2012  . Hyponatremia 08/16/2012  . Atrial fibrillation ([place])   . Hypertension   . Anxiety   . Mixed hyperlipidemia 09/11/2009  . GERD 08/30/2008  . CHEST PAIN 08/30/2008  . Coronary atherosclerosis 04/04/2008  . Syncope and collapse 04/04/2008   Past Medical History:  Past Medical History:  Diagnosis Date  . Ankle fracture   . Anxiety   . Arthritis   . Barrett's esophagus   . Chronic atrial fibrillation    a. Pt previously declined DCCV.  Marland Kitchen Coronary artery disease    a. s/p Cypher DES to LAD in 2006;  b. Last LHC 02/2008:  pLAD 30%, mLAD stent patent, pOM 50%, EF 65%;  c. Lexiscan Myoview 7/14:  Intermediate risk, dist ant and inf-lat ischemia, EF 66% - patient declined cath and elected medical therapy.  Marland Kitchen GERD (gastroesophageal reflux disease)   . Hiatal hernia   . Hx of echocardiogram    Echocardiogram 08/17/12: EF 55-60%, MAC, mild MR, mild  LAE, mild RVE, mild to moderate RAE, moderate TR, mildly increased pulmonary artery systolic pressures  . Hyperlipidemia   . Hypertension   . Mitral regurgitation    a. Mod by echo 10/2014.  . Obesity   . Pulmonary hypertension ([place])    a. Mod-severely elevated PA pressures by echo 10/2014.  Marland Kitchen QT prolongation    a. During 10/2014 admit, QTC 569m.  . Tricuspid regurgitation    a. Mod by echo 10/2014.  .Marland KitchenUTI (lower urinary tract infection) 03/2015   Past Surgical History:  Past Surgical History:  Procedure Laterality Date  . ABDOMINAL HYSTERECTOMY    . ANKLE SURGERY  2001  . BREAST EXCISIONAL BIOPSY Right 1970  . BREAST EXCISIONAL BIOPSY Right   . BREAST EXCISIONAL BIOPSY Left   . CESAREAN SECTION    . CORONARY ANGIOPLASTY    . ESOPHAGOGASTRODUODENOSCOPY     Social History:  reports that she has quit smoking. Her smoking use included cigarettes. She has never used smokeless tobacco. She reports that she does not drink alcohol or use drugs.  Family / Support Systems Marital Status: Widow/Widower Patient Roles: Parent, Other (Comment)(grandmother) Children: BCleaster Corin- dtr - [phone] Supports: SPetra Kuba- granddtr - ([phone] Caregiver: daughter Ability/Limitations of Caregiver: Daughter is retired and can assist. Caregiver Availability: 24/7(Pt's dtr does take care of her husband, as well.) Family Dynamics: Dtr is very caring and supportive of pt.  Social History  Preferred language: English Religion: Methodist Read: Yes Write: Yes Legal Hisotry/Current Legal Issues: none reported Guardian/Conservator: MD has determined that pt is not capable of making her own decisions.  Dtr is next of kin.   Abuse/Neglect Abuse/Neglect Assessment Can Be Completed: Unable to assess, patient is non-responsive or altered mental status Physical Abuse: Denies Verbal Abuse: Denies Sexual Abuse: Denies Exploitation of patient/patient's resources:  Denies Self-Neglect: Denies  Emotional Status Pt's affect, behavior adn adjustment status: Pt was laying in bed with a washcloth over her forehead and the lights dimmed, wishing that her headache would go away.  Feeling very poorly. Recent Psychosocial Issues: Pt with dementia PTA. Pyschiatric History: Pt with anxiety PTA. Substance Abuse History: none reported  Patient / Family Perceptions, Expectations & Goals Pt/Family understanding of illness & functional limitations: Dtr has a good understanding of pt's condition. Premorbid pt/family roles/activities: Pt enjoys spending time with family. Anticipated changes in roles/activities/participation: Hopeful to resume this when she is well. Pt/family expectations/goals: Pt's dtr would like for pt to be able to come home and she could provide that level of care.  CSW was not able to to ask pt what her goal is due to her not feeling well.  Community [employer] Agencies: None Premorbid Home Care/DME Agencies: Other (Comment)(has a cane and rolling walker) Transportation available at discharge: family Resource referrals recommended: Neuropsychology, Support group (specify)  Discharge Planning Living Arrangements: Children Support Systems: Children, Other relatives Type of Residence: Private residence Insurance Resources: [employer], Multimedia programmer (specify)([employer] supplement) Museum/gallery curator Resources: Radio broadcast assistant Screen Referred: No Living Expenses: Lives with family Money Management: Patient Does the patient have any problems obtaining your medications?: No Home Management: Pt's dtr takes care of the home. Patient/Family Preliminary Plans: Dtr hopes pt progresses to a level pt can be managed at home, but she may need to go for SNF placement if not, as dtr also cares for her husband. Social Work Anticipated Follow Up Needs: HH/OP, Support Group Expected length of stay: 3 to 4 weeks  Clinical  Impression CSW met with pt's dtr while pt was in therapy to introduce self and role of CSW and then later met pt.  Pt was too uncomfortable with a headache to really meet with CSW.  Most assessment information came from dtr.  Dtr cares for husband who is on dialysis and is also admitted to [facility] currently.  Dtr is holding up okay, but worries if she can care for both of them at home.  She awaits pt's progress, although pt with medical issues since she came to [facility] and has not been able to fully participate in therapies.  CSW will continue to follow and assist pt/dtr as pt goes through [employer] program.    Sumer Moorehouse, Silvestre Mesi 12/26/2017, 1:44 AM

## 2017-12-26 NOTE — Progress Notes (Signed)
Physical Therapy Session Note  Patient Details  Name: [name] MRN: [id number] Date of Birth: [birth year]  Today's Date: 12/26/2017   Short Term Goals: Week 1:  PT Short Term Goal 1 (Week 1): Pt will perform bed mobility with max assist of 1 consistently  PT Short Term Goal 2 (Week 1): Pt will sit EOB x 3 min with mod assist  PT Short Term Goal 3 (Week 1): Pt will perform bed<>WC transfers with max assist  PT Short Term Goal 4 (Week 1): Pt will tolerate sitting in WC for up to 2 hours to prepare for out of bed mobility   Skilled Therapeutic Interventions/Progress Updates:   Per chart review Pt on MD hold due to increased lethargy, elevated temperature, and abnormal lab values. Will continue to monitor and treat as pt medically appropriate for Therapy.      Therapy Documentation Precautions:  Precautions Precautions: Fall Precaution Comments: pushing left Restrictions Weight Bearing Restrictions: No General: PT Amount of Missed Time (min): 135 Minutes PT Missed Treatment Reason: MD hold (Comment)(fibrile and increased lethargy) Vital Signs: Therapy Vitals Temp: 99.5 F (37.5 C) Temp Source: Axillary Pulse Rate: 79 Resp: 16 BP: (!) 123/58 Patient Position (if appropriate): Lying Oxygen Therapy SpO2: 95 % O2 Device: Room Air    Therapy/Group: Individual Therapy  [name] 12/26/2017, 10:01 AM

## 2017-12-26 NOTE — Plan of Care (Signed)
  Problem: RH SKIN INTEGRITY Goal: RH STG MAINTAIN SKIN INTEGRITY WITH ASSISTANCE Description STG Maintain Skin Integrity With mod Assistance.  Outcome: Progressing   Problem: RH SAFETY Goal: RH STG ADHERE TO SAFETY PRECAUTIONS W/ASSISTANCE/DEVICE Description STG Adhere to Safety Precautions With Assistance/Device. Mod  Outcome: Progressing   Problem: RH PAIN MANAGEMENT Goal: RH STG PAIN MANAGED AT OR BELOW PT'S PAIN GOAL Description Less than 5  Outcome: Progressing

## 2017-12-26 NOTE — Progress Notes (Signed)
Patient ID: [name], female   DOB: 1926/03/04, 84 y.o.   MRN: [id number]  PROGRESS NOTE    [name]  IDP:[id number] DOB: [birth year] DOA: 12/23/2017 PCP: Merrilee Seashore, MD   Brief Narrative:  84 y.o. female with history of dementia, chronic atrial fibrillation on Xarelto until recently, pulmonary hypertension, Barrett's esophagus who was recently admitted on 12/18/2017 patient for a large intraventricular hemorrhage which was treated conservatively along with E. coli UTI which was initially treated with Rocephin and switched to Keflex and discharged to inpatient rehab on 12/23/2017.  Hospitalist service was consulted on 12/25/2017 for fever and increased lethargy.  Patient was started on broad-spectrum antibiotics on 12/25/2017.   Assessment & Plan:   Active Problems:   Intraventricular hemorrhage (HCC)   ICH (intracerebral hemorrhage) (HCC)   Acute blood loss anemia   Hypoalbuminemia due to protein-calorie malnutrition (HCC)   Fever with lethargy -Questionable cause.  Patient has been started on broad-spectrum antibiotics by the primary team on 12/25/2017.  Continue antibiotics for now.  Follow cultures.  Chest x-ray is negative for any infiltrates.  UA is unremarkable.  Patient has recently completed antibiotic treatment for E. coli UTI -Unclear if the patient is aspirating and chest x-ray has not shown any infiltrates yet.  Monitor respiratory status.  Might need SLP reevaluation. -CT of the head shows no new bleeding but increased vasogenic edema in the right temporal lobe.  Recommend neurosurgery reevaluation. -Patient is still lethargic on the time of my examination.  Unclear if patient is having nonconvulsive seizures.  Recommend neurology evaluation and EEG per primary team -Patient was started on Topamax recently which can give drowsiness and fever.  Topamax has been discontinued on 12/25/2017 -Keppra can cause lethargy.  Need for Keppra needs to be confirmed from  neurosurgery. -No signs of thrombophlebitis -No calf swelling to indicate DVT -Patient had low-grade temperature this morning.  If patient continues to spike temperature: Might need further imaging studies including CT of the chest, abdomen and pelvis -Consider central fever if antibiotics do not help -LFTs stable this morning except for mildly elevated bili total -If condition does not improve, consider palliative care evaluation  Leukocytosis -Antibiotic plan as above.  Repeat a.m. labs  Recent E. coli UTI -Status post completion of antibiotic treatment.  E. coli was pansensitive  Recent intraventricular hemorrhage -Conservatively treated.  Recommend neurosurgery evaluation for lethargy and worsening right temporal lobe edema seen on CAT scan  Hypertension -Monitor blood pressure.  Continue irbesartan and metoprolol  Chronic atrial fibrillation -Rate controlled.  Continue metoprolol.  Off Xarelto since recent intraventricular hemorrhage  Hypokalemia -Replace intravenously with intravenous fluids.  Monitor  Hyponatremia -Encouraged the patient to increase oral intake or if more awake.  Continue IV fluids.  Monitor   Antimicrobials: Vancomycin and Zosyn started on 12/25/2017 Patient was on Rocephin from 12/18/2017-12/23/2017 and subsequently on Keflex from 12/23/2017-12/24/2017  Subjective: Patient seen and examined at bedside.  She is sleepy, hardly wakes up on calling her name.  As per the prior notes from primary team this morning, patient was apparently more awake and following basic commands.  Objective: Vitals:   12/25/17 1622 12/25/17 1927 12/26/17 0527 12/26/17 0931  BP: (!) 146/80 (!) 156/76 (!) 152/70 (!) 123/58  Pulse: 76 85 83 79  Resp:  19 16 16   Temp:  98.2 F (36.8 C) (!) 100.4 F (38 C) 99.5 F (37.5 C)  TempSrc:  Axillary Oral Axillary  SpO2:  95% 93% 95%  Weight:  Height:        Intake/Output Summary (Last 24 hours) at 12/26/2017 0933 Last  data filed at 12/26/2017 0836 Gross per 24 hour  Intake 672.1 ml  Output 1200 ml  Net -527.9 ml   Filed Weights   12/23/17 1624  Weight: 84.1 kg    Examination:  General exam: Appears older than stated age.  Lethargic. Respiratory system: Bilateral decreased breath sounds at bases with some scattered crackles Cardiovascular system: S1 & S2 heard, Rate controlled Gastrointestinal system: Abdomen is nondistended, soft and nontender. Normal bowel sounds heard. Extremities: No cyanosis; trace edema   Data Reviewed: I have personally reviewed following labs and imaging studies  CBC: Recent Labs  Lab 12/24/17 0548 12/25/17 0458  WBC 10.5 12.7*  NEUTROABS 7.3 9.1*  HGB 10.4* 10.9*  HCT 30.7* 33.8*  MCV 88.2 88.5  PLT 177 578   Basic Metabolic Panel: Recent Labs  Lab 12/24/17 0548 12/24/17 1227 12/24/17 1823 12/25/17 0458 12/26/17 0610  NA 132*  --  133* 132* 129*  K 2.7*  --  3.4* 3.5 2.9*  CL 100  --  96* 95* 97*  CO2 25  --  30 28 23   GLUCOSE 105*  --  102* 98 158*  BUN 11  --  11 12 16   CREATININE 0.67  --  0.60 0.65 0.84  CALCIUM 8.4*  --  8.9 8.8* 8.4*  MG  --  1.5*  --   --   --    GFR: Estimated Creatinine Clearance: 48.6 mL/min (by C-G formula based on SCr of 0.84 mg/dL). Liver Function Tests: Recent Labs  Lab 12/24/17 0548 12/26/17 0610  AST 35 32  ALT 36 27  ALKPHOS 61 64  BILITOT 1.5* 1.4*  PROT 6.0* 5.8*  ALBUMIN 2.8* 2.5*   No results for input(s): LIPASE, AMYLASE in the last 168 hours. Recent Labs  Lab 12/26/17 0610  AMMONIA 36*   Coagulation Profile: No results for input(s): INR, PROTIME in the last 168 hours. Cardiac Enzymes: No results for input(s): CKTOTAL, CKMB, CKMBINDEX, TROPONINI in the last 168 hours. BNP (last 3 results) No results for input(s): PROBNP in the last 8760 hours. HbA1C: No results for input(s): HGBA1C in the last 72 hours. CBG: Recent Labs  Lab 12/24/17 0629 12/24/17 1214  GLUCAP 111* 128*   Lipid  Profile: No results for input(s): CHOL, HDL, LDLCALC, TRIG, CHOLHDL, LDLDIRECT in the last 72 hours. Thyroid Function Tests: Recent Labs    12/26/17 0610  TSH 2.265   Anemia Panel: No results for input(s): VITAMINB12, FOLATE, FERRITIN, TIBC, IRON, RETICCTPCT in the last 72 hours. Sepsis Labs: No results for input(s): PROCALCITON, LATICACIDVEN in the last 168 hours.  Recent Results (from the past 240 hour(s))  Culture, Urine     Status: Abnormal   Collection Time: 12/18/17  2:18 PM  Result Value Ref Range Status   Specimen Description URINE, RANDOM  Final   Special Requests   Final    NONE Performed at [facility], [address], [place], [place] [address]    Culture >=100,000 COLONIES/mL ESCHERICHIA COLI (A)  Final   Report Status 12/21/2017 FINAL  Final   Organism ID, Bacteria ESCHERICHIA COLI (A)  Final      Susceptibility   Escherichia coli - MIC*    AMPICILLIN 8 SENSITIVE Sensitive     CEFAZOLIN <=4 SENSITIVE Sensitive     CEFTRIAXONE <=1 SENSITIVE Sensitive     CIPROFLOXACIN <=0.25 SENSITIVE Sensitive  GENTAMICIN 4 SENSITIVE Sensitive     IMIPENEM <=0.25 SENSITIVE Sensitive     NITROFURANTOIN <=16 SENSITIVE Sensitive     TRIMETH/SULFA <=20 SENSITIVE Sensitive     AMPICILLIN/SULBACTAM <=2 SENSITIVE Sensitive     PIP/TAZO <=4 SENSITIVE Sensitive     Extended ESBL NEGATIVE Sensitive     * >=100,000 COLONIES/mL ESCHERICHIA COLI  MRSA PCR Screening     Status: None   Collection Time: 12/18/17  8:07 PM  Result Value Ref Range Status   MRSA by PCR NEGATIVE NEGATIVE Final    Comment:        The GeneXpert MRSA Assay (FDA approved for NASAL specimens only), is one component of a comprehensive MRSA colonization surveillance program. It is not intended to diagnose MRSA infection nor to guide or monitor treatment for MRSA infections. Performed at [facility], [place] [address] [place], [place] [address]          Radiology Studies: Ct Head Wo  Contrast  Result Date: 12/25/2017 CLINICAL DATA:  Follow-up hemorrhage. EXAM: CT HEAD WITHOUT CONTRAST TECHNIQUE: Contiguous axial images were obtained from the base of the skull through the vertex without intravenous contrast. COMPARISON:  12/18/2017 FINDINGS: Brain: A large hemorrhage following the right lateral ventricle has decreased in bulk, when remeasured at the level of the lateral ventricle it is 2.4 cm in diameter compared to 2.9 cm previously. Small volume blood layering in the occipital horn of left lateral ventricle is unchanged. There is increased edema about the hematoma, best seen at the level of the right temporal lobe. This low-density does not involve gray matter to clearly suggest an underlying infarct. The septum pellucidum is displaced by blood clot but there is no generalized midline shift. Generalized atrophy. Vascular: Negative Skull: No acute finding. Sinuses/Orbits: No acute finding IMPRESSION: 1. Interval mild contraction of clot in the right temporal lobe and right more than left lateral ventricles. 2. No new/progressive hemorrhage. 3. Increased vasogenic edema in the right temporal lobe. Electronically Signed   By: Monte Fantasia M.D.   On: 12/25/2017 16:01   Dg Chest Port 1 View  Result Date: 12/25/2017 CLINICAL DATA:  Fever. EXAM: PORTABLE CHEST 1 VIEW COMPARISON:  04/10/2015 FINDINGS: Cardiac silhouette is mildly enlarged. No mediastinal or hilar masses. There is no evidence of adenopathy. Are prominent bronchovascular markings most evident the bases. Additional streaky opacities noted at the left lung base, likely atelectasis. No convincing pneumonia. No pulmonary edema. No pleural effusion or pneumothorax. Skeletal structures are demineralized but grossly intact. IMPRESSION: No acute cardiopulmonary disease. Electronically Signed   By: Lajean Manes M.D.   On: 12/25/2017 13:27        Scheduled Meds: . calcium carbonate  1 tablet Oral Q breakfast  . cholecalciferol   1,000 Units Oral Daily  . docusate sodium  100 mg Oral BID  . feeding supplement (PRO-STAT SUGAR FREE 64)  30 mL Oral BID  . irbesartan  150 mg Oral Daily  . levETIRAcetam  500 mg Oral BID  . magnesium oxide  400 mg Oral BID  . mouth rinse  15 mL Mouth Rinse BID  . metoprolol tartrate  25 mg Oral BID  . pantoprazole  40 mg Oral Daily  . polyethylene glycol  17 g Oral Daily  . potassium chloride  20 mEq Oral BID  . tamsulosin  0.4 mg Oral Daily  . vitamin B-12  1,000 mcg Oral Daily  . vitamin C  500 mg Oral Daily  . vitamin  E  400 Units Oral Daily   Continuous Infusions: . 0.9 % NaCl with KCl 20 mEq / L Stopped (12/25/17 1840)  . piperacillin-tazobactam (ZOSYN)  IV 3.375 g (12/26/17 0156)  . vancomycin 750 mg (12/26/17 0629)     LOS: 3 days        Aline August, MD [facility] Pager [phone]  If 7PM-7AM, please contact night-coverage [email or web address] Password [facility] 12/26/2017, 9:33 AM

## 2017-12-27 DIAGNOSIS — I482 Chronic atrial fibrillation, unspecified: Secondary | ICD-10-CM

## 2017-12-27 LAB — COMPREHENSIVE METABOLIC PANEL
ALT: 28 U/L (ref 0–44)
ANION GAP: 7 (ref 5–15)
AST: 31 U/L (ref 15–41)
Albumin: 2.4 g/dL — ABNORMAL LOW (ref 3.5–5.0)
Alkaline Phosphatase: 64 U/L (ref 38–126)
BILIRUBIN TOTAL: 1.1 mg/dL (ref 0.3–1.2)
BUN: 13 mg/dL (ref 8–23)
CHLORIDE: 103 mmol/L (ref 98–111)
CO2: 23 mmol/L (ref 22–32)
Calcium: 8.3 mg/dL — ABNORMAL LOW (ref 8.9–10.3)
Creatinine, Ser: 0.81 mg/dL (ref 0.44–1.00)
Glucose, Bld: 132 mg/dL — ABNORMAL HIGH (ref 70–99)
POTASSIUM: 3.7 mmol/L (ref 3.5–5.1)
Sodium: 133 mmol/L — ABNORMAL LOW (ref 135–145)
Total Protein: 5.8 g/dL — ABNORMAL LOW (ref 6.5–8.1)

## 2017-12-27 LAB — CBC WITH DIFFERENTIAL/PLATELET
Abs Immature Granulocytes: 0.5 10*3/uL — ABNORMAL HIGH (ref 0.00–0.07)
BASOS PCT: 1 %
Basophils Absolute: 0.1 10*3/uL (ref 0.0–0.1)
EOS ABS: 0.3 10*3/uL (ref 0.0–0.5)
Eosinophils Relative: 3 %
HEMATOCRIT: 33.2 % — AB (ref 36.0–46.0)
Hemoglobin: 10.5 g/dL — ABNORMAL LOW (ref 12.0–15.0)
IMMATURE GRANULOCYTES: 5 %
LYMPHS ABS: 1.4 10*3/uL (ref 0.7–4.0)
Lymphocytes Relative: 14 %
MCH: 28.4 pg (ref 26.0–34.0)
MCHC: 31.6 g/dL (ref 30.0–36.0)
MCV: 89.7 fL (ref 80.0–100.0)
MONO ABS: 1.7 10*3/uL — AB (ref 0.1–1.0)
MONOS PCT: 16 %
NEUTROS ABS: 6.6 10*3/uL (ref 1.7–7.7)
Neutrophils Relative %: 61 %
PLATELETS: 154 10*3/uL (ref 150–400)
RBC: 3.7 MIL/uL — ABNORMAL LOW (ref 3.87–5.11)
RDW: 13.2 % (ref 11.5–15.5)
WBC: 10.5 10*3/uL (ref 4.0–10.5)
nRBC: 0 % (ref 0.0–0.2)

## 2017-12-27 LAB — MAGNESIUM: MAGNESIUM: 1.5 mg/dL — AB (ref 1.7–2.4)

## 2017-12-27 NOTE — Plan of Care (Signed)
  Problem: RH SAFETY Goal: RH STG ADHERE TO SAFETY PRECAUTIONS W/ASSISTANCE/DEVICE Description STG Adhere to Safety Precautions With Assistance/Device. Mod  Outcome: Progressing   Problem: RH PAIN MANAGEMENT Goal: RH STG PAIN MANAGED AT OR BELOW PT'S PAIN GOAL Description Less than 5  Outcome: Progressing   Problem: RH BOWEL ELIMINATION Goal: RH STG MANAGE BOWEL WITH ASSISTANCE Description STG Manage Bowel with Assistance. Mod  Outcome: Not Progressing Goal: RH STG MANAGE BOWEL W/MEDICATION W/ASSISTANCE Description STG Manage Bowel with Medication with Assistance. mod  Outcome: Not Progressing   Problem: RH BLADDER ELIMINATION Goal: RH STG MANAGE BLADDER WITH ASSISTANCE Description STG Manage Bladder With mod Assistance   Outcome: Not Progressing   Problem: RH SKIN INTEGRITY Goal: RH STG MAINTAIN SKIN INTEGRITY WITH ASSISTANCE Description STG Maintain Skin Integrity With mod Assistance.  Outcome: Not Progressing   Problem: RH COGNITION-NURSING Goal: RH STG USES MEMORY AIDS/STRATEGIES W/ASSIST TO PROBLEM SOLVE Description STG Uses Memory Aids/Strategies With Assistance to Problem Solve. Mod  Outcome: Not Progressing

## 2017-12-27 NOTE — Progress Notes (Signed)
[facility] PHYSICAL MEDICINE & REHABILITATION PROGRESS NOTE  Subjective/Complaints: Appreciate hospitalist note,remains afeb on pip/tazo and vanc ROS: Limited due to cognitive/behavioral    Objective: Vital Signs: Blood pressure (!) 159/64, pulse 70, temperature 97.7 F (36.5 C), temperature source Oral, resp. rate 16, height 5\' 5"  (1.651 m), weight 84.1 kg, SpO2 96 %. Ct Head Wo Contrast  Result Date: 12/25/2017 CLINICAL DATA:  Follow-up hemorrhage. EXAM: CT HEAD WITHOUT CONTRAST TECHNIQUE: Contiguous axial images were obtained from the base of the skull through the vertex without intravenous contrast. COMPARISON:  12/18/2017 FINDINGS: Brain: A large hemorrhage following the right lateral ventricle has decreased in bulk, when remeasured at the level of the lateral ventricle it is 2.4 cm in diameter compared to 2.9 cm previously. Small volume blood layering in the occipital horn of left lateral ventricle is unchanged. There is increased edema about the hematoma, best seen at the level of the right temporal lobe. This low-density does not involve gray matter to clearly suggest an underlying infarct. The septum pellucidum is displaced by blood clot but there is no generalized midline shift. Generalized atrophy. Vascular: Negative Skull: No acute finding. Sinuses/Orbits: No acute finding IMPRESSION: 1. Interval mild contraction of clot in the right temporal lobe and right more than left lateral ventricles. 2. No new/progressive hemorrhage. 3. Increased vasogenic edema in the right temporal lobe. Electronically Signed   By: Monte Fantasia M.D.   On: 12/25/2017 16:01   Dg Chest Port 1 View  Result Date: 12/25/2017 CLINICAL DATA:  Fever. EXAM: PORTABLE CHEST 1 VIEW COMPARISON:  04/10/2015 FINDINGS: Cardiac silhouette is mildly enlarged. No mediastinal or hilar masses. There is no evidence of adenopathy. Are prominent bronchovascular markings most evident the bases. Additional streaky opacities noted at  the left lung base, likely atelectasis. No convincing pneumonia. No pulmonary edema. No pleural effusion or pneumothorax. Skeletal structures are demineralized but grossly intact. IMPRESSION: No acute cardiopulmonary disease. Electronically Signed   By: Lajean Manes M.D.   On: 12/25/2017 13:27   Recent Labs    12/26/17 0844 12/27/17 0620  WBC 12.8* 10.5  HGB 10.4* 10.5*  HCT 30.4* 33.2*  PLT 203 154   Recent Labs    12/26/17 0610 12/27/17 0620  NA 129* 133*  K 2.9* 3.7  CL 97* 103  CO2 23 23  GLUCOSE 158* 132*  BUN 16 13  CREATININE 0.84 0.81  CALCIUM 8.4* 8.3*    Physical Exam: BP (!) 159/64 (BP Location: Right Arm)   Pulse 70   Temp 97.7 F (36.5 C) (Oral)   Resp 16   Ht 5\' 5"  (1.651 m)   Wt 84.1 kg   SpO2 96%   BMI 30.85 kg/m  Constitutional: No distress . Vital signs reviewed. HEENT: EOMI, oral membranes moist Neck: supple Cardiovascular: IRRwithout murmur. No JVD    Respiratory: CTA Bilaterally without wheezes or rales. Normal effort    GI: BS +, non-tender, non-distended  Musculoskeletal:  Edema most significant in right upper extremity  Neurological:  Lethargic.  Oriented x2. BUE tremors noted.  Dysarthria  Motor: Limited due to participation RUE: 4/5 proximal to distal RLE: 2/5 proximal to distal (?effort) LUE: 2/5 proximal to distal (?effort) LLE: 2-/5 proximal to distal   Sensation intact LT RUE and RLE, absent LT LUE and LLE Skin: Skin is warm and dry.  BUE with diffuse ecchymotic areas.   Psychiatric: Unable to assess due to mentation   Assessment/Plan: 1. Functional deficits secondary to right intraventricular hemorrhage which require  3+ hours per day of interdisciplinary therapy in a comprehensive inpatient rehab setting.  Physiatrist is providing close team supervision and 24 hour management of active medical problems listed below.  Physiatrist and rehab team continue to assess barriers to discharge/monitor patient progress toward  functional and medical goals  Care Tool:  Bathing    Body parts bathed by patient: Face   Body parts bathed by helper: Right arm, Left arm, Chest, Abdomen, Front perineal area, Buttocks     Bathing assist Assist Level: 2 Helpers     Upper Body Dressing/Undressing Upper body dressing   What is the patient wearing?: Hospital gown only    Upper body assist Assist Level: 2 Helpers    Lower Body Dressing/Undressing Lower body dressing      What is the patient wearing?: Hospital gown only     Lower body assist Assist for lower body dressing: 2 Helpers     Toileting Toileting    Toileting assist Assist for toileting: 2 Helpers     Transfers Chair/bed transfer  Transfers assist  Chair/bed transfer activity did not occur: Safety/medical concerns  Chair/bed transfer assist level: Dependent - mechanical lift     Locomotion Ambulation   Ambulation assist   Ambulation activity did not occur: Safety/medical concerns          Walk 10 feet activity   Assist  Walk 10 feet activity did not occur: Safety/medical concerns        Walk 50 feet activity   Assist Walk 50 feet with 2 turns activity did not occur: Safety/medical concerns         Walk 150 feet activity   Assist Walk 150 feet activity did not occur: Safety/medical concerns         Walk 10 feet on uneven surface  activity   Assist Walk 10 feet on uneven surfaces activity did not occur: Safety/medical concerns         Wheelchair     Assist     Wheelchair activity did not occur: Safety/medical concerns         Wheelchair 50 feet with 2 turns activity    Assist    Wheelchair 50 feet with 2 turns activity did not occur: Safety/medical concerns       Wheelchair 150 feet activity     Assist Wheelchair 150 feet activity did not occur: Safety/medical concerns          Medical Problem List and Plan: 1.  Visual field deficits with gaze preference, visual  hallucinations, difficulty keeping eyes open,  pusher tendencies and left bias with standing secondary to large intraventricular hemorrhage   -increased lethargy, temp-  - head CT reduce size of IVH, mild increase vasogenic edema   -supportive care   -vanc/zosyn per IM 2.  DVT Prophylaxis/Anticoagulation: Mechanical: Sequential compression devices, below knee Bilateral lower extremities 3. Headaches/Pain Management: Limit sedating medications. Started Topamax at bedtime and limit hydrocodone as needed for severe HA.  4. Mood: LCSW to follow for evaluation and support when appropriate. Resume home ativan prn at lower dose.  5. Neuropsych: This patient is not capable of making decisions on her own behalf 6. Skin/Wound Care: Routine pressure relief measures 7. Fluids/Electrolytes/Nutrition: Monitor I's and O's.   8.  E. coli UTI: Completed 7 days of ctx/amoxicillin 9.  HTN: Blood pressure goals less than 160/90.  Continue metoprolol twice daily and increased Avapro to 150 mg for blood pressure control.  Monitor with increased mobility 10.  Seizure prophylaxis:  Continue Keppra twice daily. 11.  Urinary retention: Discontinued Ditropan.  Continue Flomax routine pressure relief measures. 12.  Leukocytosis: Resolved- BP and Ucx neg thus far  WBCs --down  to 10.5 today  Continue to monitor 13. Hyponatremia:   Sodium 132 on 11/7--132  11/8, 129 on 11/9 on .9NS IVF- per IM  Continue to monitor 14. H/o Barrett's esophagus/GERD: On Protonix 15.  Hypokalemia  Critical value of 2.7 on 11/7, up to 3.7 - per IM   -continue supp 16.  Hypoalbuminemia  Supplement initiated on 11/7 17.  Acute blood loss anemia  Hemoglobin 10.4 on 11/7---up to 10.9 11/8    LOS: 4 days A FACE TO FACE EVALUATION WAS PERFORMED  Charlett Blake 12/27/2017, 8:27 AM

## 2017-12-27 NOTE — Progress Notes (Signed)
Patient ID: [name], female   DOB: September 17, 1926, 84 y.o.   MRN: [id number]  PROGRESS NOTE    [name]  WUX:[id number] DOB: Jan 23, 1927 DOA: 12/23/2017 PCP: Merrilee Seashore, MD   Brief Narrative:  84 y.o. female with history of dementia, chronic atrial fibrillation on Xarelto until recently, pulmonary hypertension, Barrett's esophagus who was recently admitted on 12/18/2017 patient for a large intraventricular hemorrhage which was treated conservatively along with E. coli UTI which was initially treated with Rocephin and switched to Keflex and discharged to inpatient rehab on 12/23/2017.  Hospitalist service was consulted on 12/25/2017 for fever and increased lethargy.  Patient was started on broad-spectrum antibiotics on 12/25/2017.   Assessment & Plan:   Active Problems:   Intraventricular hemorrhage (HCC)   ICH (intracerebral hemorrhage) (HCC)   Acute blood loss anemia   Hypoalbuminemia due to protein-calorie malnutrition (HCC)   Fever with lethargy -Questionable cause.    Currently on vancomycin and Zosyn.  Cultures negative so far.  Discontinue vancomycin.  We will continue Zosyn for 1 more day.  Currently afebrile for the last 24 hours.   Follow cultures.   -CT of the head on 12/25/2017 showed no new bleeding but increased vasogenic edema in the right temporal lobe.  Recommend neurosurgery reevaluation. -Patient is still lethargic on the time of my examination.  Unclear if patient is having nonconvulsive seizures.  Recommend neurology evaluation and EEG per primary team -Patient was started on Topamax recently which can give drowsiness and fever.  Topamax has been discontinued since 12/25/2017 -Keppra can cause lethargy.  Need for Keppra needs to be confirmed from neurosurgery. -No signs of thrombophlebitis -No calf swelling to indicate DVT  Leukocytosis -Resolved.  Recent E. coli UTI -Status post completion of antibiotic treatment.  E. coli was  pansensitive  Recent intraventricular hemorrhage -Conservatively treated.  Recommend neurosurgery evaluation for lethargy and worsening right temporal lobe edema seen on CAT scan  Hypertension -Monitor blood pressure.  Continue irbesartan and metoprolol  Chronic atrial fibrillation -Rate controlled.  Continue metoprolol.  Off Xarelto since recent intraventricular hemorrhage  Hypokalemia -Replaced.  Improved  Hyponatremia -Improving.  If oral intake is improving, recommend discontinue IV fluids and monitor.   Antimicrobials: Vancomycin and Zosyn started on 12/25/2017 Patient was on Rocephin from 12/18/2017-12/23/2017 and subsequently on Keflex from 12/23/2017-12/24/2017  Subjective: Patient seen and examined at bedside.  Still drowsy, wakes up only slightly on calling her name.  No overnight fever or vomiting. Objective: Vitals:   12/26/17 0931 12/26/17 1422 12/26/17 2017 12/27/17 0429  BP: (!) 123/58 (!) 121/55 (!) 152/71 (!) 159/64  Pulse: 79 76 69 70  Resp: 16 12 18 16   Temp: 99.5 F (37.5 C) 98.1 F (36.7 C) 98.1 F (36.7 C) 97.7 F (36.5 C)  TempSrc: Axillary Oral Oral Oral  SpO2: 95% 95% 95% 96%  Weight:      Height:        Intake/Output Summary (Last 24 hours) at 12/27/2017 1015 Last data filed at 12/27/2017 0958 Gross per 24 hour  Intake 2145.85 ml  Output 3400 ml  Net -1254.15 ml   Filed Weights   12/23/17 1624  Weight: 84.1 kg    Examination:  General exam: No acute distress.  Elderly female lying in bed.  Wakes up only slightly on calling her name, does not answer much questions.  Drowsy Respiratory system: Bilateral decreased breath sounds at bases with some scattered crackles, no wheezing Cardiovascular system: Rate controlled, S1-S2 heard Gastrointestinal system: Abdomen  is nondistended, soft and nontender. Normal bowel sounds heard. Extremities: No cyanosis; trace edema   Data Reviewed: I have personally reviewed following labs and imaging  studies  CBC: Recent Labs  Lab 12/24/17 0548 12/25/17 0458 12/26/17 0844 12/27/17 0620  WBC 10.5 12.7* 12.8* 10.5  NEUTROABS 7.3 9.1*  --  6.6  HGB 10.4* 10.9* 10.4* 10.5*  HCT 30.7* 33.8* 30.4* 33.2*  MCV 88.2 88.5 87.1 89.7  PLT 177 230 203 627   Basic Metabolic Panel: Recent Labs  Lab 12/24/17 0548 12/24/17 1227 12/24/17 1823 12/25/17 0458 12/26/17 0610 12/27/17 0620  NA 132*  --  133* 132* 129* 133*  K 2.7*  --  3.4* 3.5 2.9* 3.7  CL 100  --  96* 95* 97* 103  CO2 25  --  30 28 23 23   GLUCOSE 105*  --  102* 98 158* 132*  BUN 11  --  11 12 16 13   CREATININE 0.67  --  0.60 0.65 0.84 0.81  CALCIUM 8.4*  --  8.9 8.8* 8.4* 8.3*  MG  --  1.5*  --   --   --  1.5*   GFR: Estimated Creatinine Clearance: 50.4 mL/min (by C-G formula based on SCr of 0.81 mg/dL). Liver Function Tests: Recent Labs  Lab 12/24/17 0548 12/26/17 0610 12/27/17 0620  AST 35 32 31  ALT 36 27 28  ALKPHOS 61 64 64  BILITOT 1.5* 1.4* 1.1  PROT 6.0* 5.8* 5.8*  ALBUMIN 2.8* 2.5* 2.4*   No results for input(s): LIPASE, AMYLASE in the last 168 hours. Recent Labs  Lab 12/26/17 0610  AMMONIA 36*   Coagulation Profile: No results for input(s): INR, PROTIME in the last 168 hours. Cardiac Enzymes: No results for input(s): CKTOTAL, CKMB, CKMBINDEX, TROPONINI in the last 168 hours. BNP (last 3 results) No results for input(s): PROBNP in the last 8760 hours. HbA1C: No results for input(s): HGBA1C in the last 72 hours. CBG: Recent Labs  Lab 12/24/17 0629 12/24/17 1214  GLUCAP 111* 128*   Lipid Profile: No results for input(s): CHOL, HDL, LDLCALC, TRIG, CHOLHDL, LDLDIRECT in the last 72 hours. Thyroid Function Tests: Recent Labs    12/26/17 0610  TSH 2.265   Anemia Panel: Recent Labs    12/26/17 0610  VITAMINB12 2,346*   Sepsis Labs: No results for input(s): PROCALCITON, LATICACIDVEN in the last 168 hours.  Recent Results (from the past 240 hour(s))  Culture, Urine     Status:  Abnormal   Collection Time: 12/18/17  2:18 PM  Result Value Ref Range Status   Specimen Description URINE, RANDOM  Final   Special Requests   Final    NONE Performed at [facility], [address] [place], [place] [address]    Culture >=100,000 COLONIES/mL ESCHERICHIA COLI (A)  Final   Report Status 12/21/2017 FINAL  Final   Organism ID, Bacteria ESCHERICHIA COLI (A)  Final      Susceptibility   Escherichia coli - MIC*    AMPICILLIN 8 SENSITIVE Sensitive     CEFAZOLIN <=4 SENSITIVE Sensitive     CEFTRIAXONE <=1 SENSITIVE Sensitive     CIPROFLOXACIN <=0.25 SENSITIVE Sensitive     GENTAMICIN 4 SENSITIVE Sensitive     IMIPENEM <=0.25 SENSITIVE Sensitive     NITROFURANTOIN <=16 SENSITIVE Sensitive     TRIMETH/SULFA <=20 SENSITIVE Sensitive     AMPICILLIN/SULBACTAM <=2 SENSITIVE Sensitive     PIP/TAZO <=4 SENSITIVE Sensitive     Extended ESBL NEGATIVE Sensitive     * >=  100,000 COLONIES/mL ESCHERICHIA COLI  MRSA PCR Screening     Status: None   Collection Time: 12/18/17  8:07 PM  Result Value Ref Range Status   MRSA by PCR NEGATIVE NEGATIVE Final    Comment:        The GeneXpert MRSA Assay (FDA approved for NASAL specimens only), is one component of a comprehensive MRSA colonization surveillance program. It is not intended to diagnose MRSA infection nor to guide or monitor treatment for MRSA infections. Performed at [facility], [place] [address] [place], [place] [address]   Culture, Urine     Status: None   Collection Time: 12/25/17  2:03 PM  Result Value Ref Range Status   Specimen Description URINE, CATHETERIZED  Final   Special Requests NONE  Final   Culture   Final    NO GROWTH Performed at [facility], [address] [place], [place] [address]    Report Status 12/26/2017 FINAL  Final  Culture, blood (routine x 2)     Status: None (Preliminary result)   Collection Time: 12/25/17  2:36 PM  Result Value Ref Range Status   Specimen Description  BLOOD LEFT HAND  Final   Special Requests   Final    BOTTLES DRAWN AEROBIC ONLY Blood Culture adequate volume   Culture   Final    NO GROWTH 1 DAY Performed at [facility], [place] [address], [place], [place] [address]    Report Status PENDING  Incomplete  Culture, blood (routine x 2)     Status: None (Preliminary result)   Collection Time: 12/25/17  2:40 PM  Result Value Ref Range Status   Specimen Description BLOOD RIGHT HAND  Final   Special Requests   Final    BOTTLES DRAWN AEROBIC ONLY Blood Culture adequate volume   Culture   Final    NO GROWTH 1 DAY Performed at [facility], [place] [address] [place], [place] [address]    Report Status PENDING  Incomplete         Radiology Studies: Ct Head Wo Contrast  Result Date: 12/25/2017 CLINICAL DATA:  Follow-up hemorrhage. EXAM: CT HEAD WITHOUT CONTRAST TECHNIQUE: Contiguous axial images were obtained from the base of the skull through the vertex without intravenous contrast. COMPARISON:  12/18/2017 FINDINGS: Brain: A large hemorrhage following the right lateral ventricle has decreased in bulk, when remeasured at the level of the lateral ventricle it is 2.4 cm in diameter compared to 2.9 cm previously. Small volume blood layering in the occipital horn of left lateral ventricle is unchanged. There is increased edema about the hematoma, best seen at the level of the right temporal lobe. This low-density does not involve gray matter to clearly suggest an underlying infarct. The septum pellucidum is displaced by blood clot but there is no generalized midline shift. Generalized atrophy. Vascular: Negative Skull: No acute finding. Sinuses/Orbits: No acute finding IMPRESSION: 1. Interval mild contraction of clot in the right temporal lobe and right more than left lateral ventricles. 2. No new/progressive hemorrhage. 3. Increased vasogenic edema in the right temporal lobe. Electronically Signed   By: Monte Fantasia M.D.   On: 12/25/2017  16:01   Dg Chest Port 1 View  Result Date: 12/25/2017 CLINICAL DATA:  Fever. EXAM: PORTABLE CHEST 1 VIEW COMPARISON:  04/10/2015 FINDINGS: Cardiac silhouette is mildly enlarged. No mediastinal or hilar masses. There is no evidence of adenopathy. Are prominent bronchovascular markings most evident the bases. Additional streaky opacities  noted at the left lung base, likely atelectasis. No convincing pneumonia. No pulmonary edema. No pleural effusion or pneumothorax. Skeletal structures are demineralized but grossly intact. IMPRESSION: No acute cardiopulmonary disease. Electronically Signed   By: Lajean Manes M.D.   On: 12/25/2017 13:27        Scheduled Meds: . calcium carbonate  1 tablet Oral Q breakfast  . cholecalciferol  1,000 Units Oral Daily  . docusate sodium  100 mg Oral BID  . feeding supplement (PRO-STAT SUGAR FREE 64)  30 mL Oral BID  . irbesartan  150 mg Oral Daily  . levETIRAcetam  500 mg Oral BID  . magnesium oxide  400 mg Oral BID  . mouth rinse  15 mL Mouth Rinse BID  . metoprolol tartrate  25 mg Oral BID  . pantoprazole  40 mg Oral Daily  . polyethylene glycol  17 g Oral Daily  . potassium chloride  20 mEq Oral BID  . tamsulosin  0.4 mg Oral Daily  . vitamin B-12  1,000 mcg Oral Daily  . vitamin C  500 mg Oral Daily  . vitamin E  400 Units Oral Daily   Continuous Infusions: . 0.9 % NaCl with KCl 40 mEq / L 75 mL/hr (12/27/17 0107)  . piperacillin-tazobactam (ZOSYN)  IV 3.375 g (12/27/17 0934)  . vancomycin 750 mg (12/27/17 0517)     LOS: 4 days        Aline August, MD [facility] Pager [phone]  If 7PM-7AM, please contact night-coverage [email or web address] Password [email or web address] 12/27/2017, 10:15 AM

## 2017-12-28 ENCOUNTER — Inpatient Hospital Stay (HOSPITAL_COMMUNITY): Payer: Medicare Other

## 2017-12-28 ENCOUNTER — Inpatient Hospital Stay (HOSPITAL_COMMUNITY): Payer: Medicare Other | Admitting: Speech Pathology

## 2017-12-28 ENCOUNTER — Inpatient Hospital Stay (HOSPITAL_COMMUNITY): Payer: Medicare Other | Admitting: Occupational Therapy

## 2017-12-28 ENCOUNTER — Inpatient Hospital Stay (HOSPITAL_COMMUNITY): Payer: Medicare Other | Admitting: *Deleted

## 2017-12-28 DIAGNOSIS — I69391 Dysphagia following cerebral infarction: Secondary | ICD-10-CM

## 2017-12-28 DIAGNOSIS — D72829 Elevated white blood cell count, unspecified: Secondary | ICD-10-CM

## 2017-12-28 DIAGNOSIS — R5383 Other fatigue: Secondary | ICD-10-CM

## 2017-12-28 LAB — MAGNESIUM: MAGNESIUM: 1.4 mg/dL — AB (ref 1.7–2.4)

## 2017-12-28 LAB — BASIC METABOLIC PANEL
Anion gap: 8 (ref 5–15)
BUN: 11 mg/dL (ref 8–23)
CALCIUM: 8.5 mg/dL — AB (ref 8.9–10.3)
CO2: 23 mmol/L (ref 22–32)
CREATININE: 0.65 mg/dL (ref 0.44–1.00)
Chloride: 99 mmol/L (ref 98–111)
Glucose, Bld: 120 mg/dL — ABNORMAL HIGH (ref 70–99)
Potassium: 3.5 mmol/L (ref 3.5–5.1)
Sodium: 130 mmol/L — ABNORMAL LOW (ref 135–145)

## 2017-12-28 LAB — CBC
HCT: 33.3 % — ABNORMAL LOW (ref 36.0–46.0)
Hemoglobin: 10.8 g/dL — ABNORMAL LOW (ref 12.0–15.0)
MCH: 28.6 pg (ref 26.0–34.0)
MCHC: 32.4 g/dL (ref 30.0–36.0)
MCV: 88.3 fL (ref 80.0–100.0)
PLATELETS: 237 10*3/uL (ref 150–400)
RBC: 3.77 MIL/uL — AB (ref 3.87–5.11)
RDW: 13.2 % (ref 11.5–15.5)
WBC: 11.7 10*3/uL — ABNORMAL HIGH (ref 4.0–10.5)
nRBC: 0 % (ref 0.0–0.2)

## 2017-12-28 MED ORDER — CLONIDINE HCL 0.1 MG PO TABS
0.1000 mg | ORAL_TABLET | Freq: Four times a day (QID) | ORAL | Status: DC | PRN
  Administered 2017-12-28 – 2018-01-03 (×7): 0.1 mg via ORAL
  Filled 2017-12-28 (×7): qty 1

## 2017-12-28 MED ORDER — MAGNESIUM SULFATE 2 GM/50ML IV SOLN
2.0000 g | Freq: Once | INTRAVENOUS | Status: AC
  Administered 2017-12-28: 2 g via INTRAVENOUS
  Filled 2017-12-28: qty 50

## 2017-12-28 MED ORDER — POTASSIUM CHLORIDE CRYS ER 20 MEQ PO TBCR
30.0000 meq | EXTENDED_RELEASE_TABLET | Freq: Two times a day (BID) | ORAL | Status: DC
  Administered 2017-12-28 – 2018-01-01 (×8): 30 meq via ORAL
  Filled 2017-12-28 (×8): qty 1

## 2017-12-28 MED ORDER — DEXAMETHASONE SODIUM PHOSPHATE 4 MG/ML IJ SOLN
2.0000 mg | Freq: Three times a day (TID) | INTRAMUSCULAR | Status: DC
  Administered 2017-12-28 – 2017-12-29 (×3): 2 mg via INTRAVENOUS
  Filled 2017-12-28 (×4): qty 0.5

## 2017-12-28 MED ORDER — SIMETHICONE 80 MG PO CHEW
80.0000 mg | CHEWABLE_TABLET | Freq: Four times a day (QID) | ORAL | Status: DC | PRN
  Administered 2018-01-10: 80 mg via ORAL
  Filled 2017-12-28: qty 1

## 2017-12-28 NOTE — Plan of Care (Signed)
  Problem: RH BOWEL ELIMINATION Goal: RH STG MANAGE BOWEL W/MEDICATION W/ASSISTANCE Description STG Manage Bowel with Medication with Assistance. mod  Outcome: Progressing   Problem: RH BLADDER ELIMINATION Goal: RH STG MANAGE BLADDER WITH ASSISTANCE Description STG Manage Bladder With mod Assistance   Outcome: Progressing   Problem: RH SKIN INTEGRITY Goal: RH STG MAINTAIN SKIN INTEGRITY WITH ASSISTANCE Description STG Maintain Skin Integrity With mod Assistance.  Outcome: Progressing   Problem: RH SAFETY Goal: RH STG ADHERE TO SAFETY PRECAUTIONS W/ASSISTANCE/DEVICE Description STG Adhere to Safety Precautions With Assistance/Device. Mod  Outcome: Progressing   Problem: RH COGNITION-NURSING Goal: RH STG USES MEMORY AIDS/STRATEGIES W/ASSIST TO PROBLEM SOLVE Description STG Uses Memory Aids/Strategies With Assistance to Problem Solve. Mod  Outcome: Progressing   Problem: RH PAIN MANAGEMENT Goal: RH STG PAIN MANAGED AT OR BELOW PT'S PAIN GOAL Description Less than 5  Outcome: Progressing

## 2017-12-28 NOTE — Progress Notes (Signed)
Patient ID: [name], female   DOB: 12/21/1926, 84 y.o.   MRN: [id number]  PROGRESS NOTE    MADILYN CEPHAS  YDX:[id number] DOB: 1926-12-03 DOA: 12/23/2017 PCP: Merrilee Seashore, MD   Brief Narrative:  84 y.o. female with history of dementia, chronic atrial fibrillation on Xarelto until recently, pulmonary hypertension, Barrett's esophagus who was recently admitted on 12/18/2017 patient for a large intraventricular hemorrhage which was treated conservatively along with E. coli UTI which was initially treated with Rocephin and switched to Keflex and discharged to inpatient rehab on 12/23/2017.  Hospitalist service was consulted on 12/25/2017 for fever and increased lethargy.  Patient was started on broad-spectrum antibiotics on 12/25/2017.   Assessment & Plan:   Active Problems:   Intraventricular hemorrhage (HCC)   ICH (intracerebral hemorrhage) (HCC)   Acute blood loss anemia   Hypoalbuminemia due to protein-calorie malnutrition (HCC)   Hypomagnesemia   Dysphagia, post-stroke   Lethargy   Fever with lethargy -Questionable cause.    Vancomycin was discontinued on 12/27/2017.  Currently on Zosyn.  Afebrile.  Patient was recently on Rocephin as well for treatment for E. coli UTI.  No evidence of any infection currently.  Will discontinue antibiotics and monitor.  Cultures negative so far.   -CT of the head on 12/25/2017 showed no new bleeding but increased vasogenic edema in the right temporal lobe.  Recommend neurosurgery reevaluation. -Patient is still lethargic at the time of my examination.  Unclear if patient is having nonconvulsive seizures.  Recommend neurology evaluation and EEG per primary team -Patient was started on Topamax recently which can give drowsiness and fever.  Topamax has been discontinued since 12/25/2017 -Keppra can cause lethargy.  Need for Keppra needs to be confirmed from neurosurgery. -No signs of thrombophlebitis -No calf swelling to indicate  DVT  Leukocytosis -Mild.  Recent E. coli UTI -Status post completion of antibiotic treatment.  E. coli was pansensitive  Recent intraventricular hemorrhage -Conservatively treated.  Recommend neurosurgery evaluation for lethargy and worsening right temporal lobe edema seen on CAT scan  Hypertension -Monitor blood pressure.  Continue irbesartan and metoprolol  Chronic atrial fibrillation -Rate controlled.  Continue metoprolol.  Off Xarelto since recent intraventricular hemorrhage  Hypokalemia -Replaced.  Improved  Hyponatremia -Stable.  Discontinue IV fluids and encourage oral intake.  Generalized deconditioning -Patient has remained lethargic for so many days now.  Consider getting palliative care involved and hospice/comfort measures.   Antimicrobials: Vancomycin 12/25/2017-12/27/2017 Zosyn from 12/25/2017 onwards Patient was on Rocephin from 12/18/2017-12/23/2017 and subsequently on Keflex from 12/23/2017-12/24/2017  Subjective: Patient seen and examined at bedside.  She is sitting on chair but drowsy, hardly wakes up on calling her name or answers any questions.  No overnight fever reported.   Objective: Vitals:   12/27/17 0429 12/27/17 1352 12/27/17 1925 12/28/17 0525  BP: (!) 159/64 (!) 119/49 (!) 157/76 (!) 161/78  Pulse: 70 69 71 79  Resp: 16 17 19 18   Temp: 97.7 F (36.5 C) 98.7 F (37.1 C) 98.9 F (37.2 C) 98.9 F (37.2 C)  TempSrc: Oral   Oral  SpO2: 96% 99% 97% 98%  Weight:      Height:        Intake/Output Summary (Last 24 hours) at 12/28/2017 1107 Last data filed at 12/28/2017 0900 Gross per 24 hour  Intake 1463.24 ml  Output 2900 ml  Net -1436.76 ml   Filed Weights   12/23/17 1624  Weight: 84.1 kg    Examination:  General exam: No distress.  Elderly female sitting on chair.  Drowsy, hardly wakes up  respiratory system: Bilateral decreased breath sounds at bases with some scattered crackles Cardiovascular system: S1-S2 heard, rate  controlled Gastrointestinal system: Abdomen is nondistended, soft and nontender. Normal bowel sounds heard. Extremities: No cyanosis; trace edema   Data Reviewed: I have personally reviewed following labs and imaging studies  CBC: Recent Labs  Lab 12/24/17 0548 12/25/17 0458 12/26/17 0844 12/27/17 0620 12/28/17 0607  WBC 10.5 12.7* 12.8* 10.5 11.7*  NEUTROABS 7.3 9.1*  --  6.6  --   HGB 10.4* 10.9* 10.4* 10.5* 10.8*  HCT 30.7* 33.8* 30.4* 33.2* 33.3*  MCV 88.2 88.5 87.1 89.7 88.3  PLT 177 230 203 154 144   Basic Metabolic Panel: Recent Labs  Lab 12/24/17 1227 12/24/17 1823 12/25/17 0458 12/26/17 0610 12/27/17 0620 12/28/17 0607  NA  --  133* 132* 129* 133* 130*  K  --  3.4* 3.5 2.9* 3.7 3.5  CL  --  96* 95* 97* 103 99  CO2  --  30 28 23 23 23   GLUCOSE  --  102* 98 158* 132* 120*  BUN  --  11 12 16 13 11   CREATININE  --  0.60 0.65 0.84 0.81 0.65  CALCIUM  --  8.9 8.8* 8.4* 8.3* 8.5*  MG 1.5*  --   --   --  1.5* 1.4*   GFR: Estimated Creatinine Clearance: 51 mL/min (by C-G formula based on SCr of 0.65 mg/dL). Liver Function Tests: Recent Labs  Lab 12/24/17 0548 12/26/17 0610 12/27/17 0620  AST 35 32 31  ALT 36 27 28  ALKPHOS 61 64 64  BILITOT 1.5* 1.4* 1.1  PROT 6.0* 5.8* 5.8*  ALBUMIN 2.8* 2.5* 2.4*   No results for input(s): LIPASE, AMYLASE in the last 168 hours. Recent Labs  Lab 12/26/17 0610  AMMONIA 36*   Coagulation Profile: No results for input(s): INR, PROTIME in the last 168 hours. Cardiac Enzymes: No results for input(s): CKTOTAL, CKMB, CKMBINDEX, TROPONINI in the last 168 hours. BNP (last 3 results) No results for input(s): PROBNP in the last 8760 hours. HbA1C: No results for input(s): HGBA1C in the last 72 hours. CBG: Recent Labs  Lab 12/24/17 0629 12/24/17 1214  GLUCAP 111* 128*   Lipid Profile: No results for input(s): CHOL, HDL, LDLCALC, TRIG, CHOLHDL, LDLDIRECT in the last 72 hours. Thyroid Function Tests: Recent Labs     12/26/17 0610  TSH 2.265   Anemia Panel: Recent Labs    12/26/17 0610  VITAMINB12 2,346*   Sepsis Labs: No results for input(s): PROCALCITON, LATICACIDVEN in the last 168 hours.  Recent Results (from the past 240 hour(s))  Culture, Urine     Status: Abnormal   Collection Time: 12/18/17  2:18 PM  Result Value Ref Range Status   Specimen Description URINE, RANDOM  Final   Special Requests   Final    NONE Performed at [facility], [address], [place], [place] [address]    Culture >=100,000 COLONIES/mL ESCHERICHIA COLI (A)  Final   Report Status 12/21/2017 FINAL  Final   Organism ID, Bacteria ESCHERICHIA COLI (A)  Final      Susceptibility   Escherichia coli - MIC*    AMPICILLIN 8 SENSITIVE Sensitive     CEFAZOLIN <=4 SENSITIVE Sensitive     CEFTRIAXONE <=1 SENSITIVE Sensitive     CIPROFLOXACIN <=0.25 SENSITIVE Sensitive     GENTAMICIN 4 SENSITIVE Sensitive     IMIPENEM <=0.25 SENSITIVE Sensitive  NITROFURANTOIN <=16 SENSITIVE Sensitive     TRIMETH/SULFA <=20 SENSITIVE Sensitive     AMPICILLIN/SULBACTAM <=2 SENSITIVE Sensitive     PIP/TAZO <=4 SENSITIVE Sensitive     Extended ESBL NEGATIVE Sensitive     * >=100,000 COLONIES/mL ESCHERICHIA COLI  MRSA PCR Screening     Status: None   Collection Time: 12/18/17  8:07 PM  Result Value Ref Range Status   MRSA by PCR NEGATIVE NEGATIVE Final    Comment:        The GeneXpert MRSA Assay (FDA approved for NASAL specimens only), is one component of a comprehensive MRSA colonization surveillance program. It is not intended to diagnose MRSA infection nor to guide or monitor treatment for MRSA infections. Performed at [facility], [place] [address], [place], [place] [address]   Culture, Urine     Status: None   Collection Time: 12/25/17  2:03 PM  Result Value Ref Range Status   Specimen Description URINE, CATHETERIZED  Final   Special Requests NONE  Final   Culture   Final    NO GROWTH Performed at [facility], [address] [place], [place] [address]    Report Status 12/26/2017 FINAL  Final  Culture, blood (routine x 2)     Status: None (Preliminary result)   Collection Time: 12/25/17  2:36 PM  Result Value Ref Range Status   Specimen Description BLOOD LEFT HAND  Final   Special Requests   Final    BOTTLES DRAWN AEROBIC ONLY Blood Culture adequate volume   Culture   Final    NO GROWTH 3 DAYS Performed at [facility], [place] [address], [place], [place] [address]    Report Status PENDING  Incomplete  Culture, blood (routine x 2)     Status: None (Preliminary result)   Collection Time: 12/25/17  2:40 PM  Result Value Ref Range Status   Specimen Description BLOOD RIGHT HAND  Final   Special Requests   Final    BOTTLES DRAWN AEROBIC ONLY Blood Culture adequate volume   Culture   Final    NO GROWTH 3 DAYS Performed at [facility], [address], [place], [place] [address]    Report Status PENDING  Incomplete         Radiology Studies: No results found.      Scheduled Meds: . calcium carbonate  1 tablet Oral Q breakfast  . cholecalciferol  1,000 Units Oral Daily  . docusate sodium  100 mg Oral BID  . feeding supplement (PRO-STAT SUGAR FREE 64)  30 mL Oral BID  . irbesartan  150 mg Oral Daily  . levETIRAcetam  500 mg Oral BID  . magnesium oxide  400 mg Oral BID  . mouth rinse  15 mL Mouth Rinse BID  . metoprolol tartrate  25 mg Oral BID  . pantoprazole  40 mg Oral Daily  . polyethylene glycol  17 g Oral Daily  . potassium chloride  30 mEq Oral BID  . tamsulosin  0.4 mg Oral Daily  . vitamin B-12  1,000 mcg Oral Daily  . vitamin C  500 mg Oral Daily  . vitamin E  400 Units Oral Daily   Continuous Infusions: . 0.9 % NaCl with KCl 40 mEq / L 50 mL/hr (12/28/17 1059)  . magnesium sulfate 1 - 4 g bolus IVPB 2 g (12/28/17 1059)  . piperacillin-tazobactam (ZOSYN)  IV Stopped (12/28/17 0505)     LOS: 5  days        Aline August, MD [facility] Pager [phone]  If 7PM-7AM, please contact night-coverage [email or web address] Password [email or web address] 12/28/2017, 11:07 AM

## 2017-12-28 NOTE — Progress Notes (Signed)
[place] PHYSICAL MEDICINE & REHABILITATION PROGRESS NOTE  Subjective/Complaints: Patient seen laying in bed this morning.  He states he slept well overnight.  She is slightly confused.  Reported issues overnight.  She was noted to be lethargic yesterday.  ROS: Limited due to cognition   Objective: Vital Signs: Blood pressure (!) 161/78, pulse 79, temperature 98.9 F (37.2 C), temperature source Oral, resp. rate 18, height 5\' 5"  (1.651 m), weight 84.1 kg, SpO2 98 %. No results found. Recent Labs    12/27/17 0620 12/28/17 0607  WBC 10.5 11.7*  HGB 10.5* 10.8*  HCT 33.2* 33.3*  PLT 154 237   Recent Labs    12/27/17 0620 12/28/17 0607  NA 133* 130*  K 3.7 3.5  CL 103 99  CO2 23 23  GLUCOSE 132* 120*  BUN 13 11  CREATININE 0.81 0.65  CALCIUM 8.3* 8.5*    Physical Exam: BP (!) 161/78 (BP Location: Right Arm)   Pulse 79   Temp 98.9 F (37.2 C) (Oral)   Resp 18   Ht 5\' 5"  (1.651 m)   Wt 84.1 kg   SpO2 98%   BMI 30.85 kg/m  Constitutional: No distress . Vital signs reviewed. HENT: Normocephalic.  Atraumatic. Eyes: EOMI. No discharge. Cardiovascular: Irregularly irregular.  No JVD. Respiratory: CTA Bilaterally. Normal effort. GI: BS +. Non-distended. Musc: No edema or tenderness in extremities. Musculoskeletal: Generalized edema Neurological:  Lethargic.  Oriented x2. Dysarthria  Motor: Limited due to participation RUE: 4/5 proximal to distal RLE: 3/5 proximal to distal (?effort) LUE: 3+/5 proximal to distal (?effort) LLE: 0/5 proximal to distal   (? effort) Skin: Skin is warm and dry.  BUE with diffuse ecchymotic areas.   Psychiatric: Unable to assess due to mentation   Assessment/Plan: 1. Functional deficits secondary to right intraventricular hemorrhage which require 3+ hours per day of interdisciplinary therapy in a comprehensive inpatient rehab setting.  Physiatrist is providing close team supervision and 24 hour management of active medical  problems listed below.  Physiatrist and rehab team continue to assess barriers to discharge/monitor patient progress toward functional and medical goals  Care Tool:  Bathing    Body parts bathed by patient: Face   Body parts bathed by helper: Right arm, Left arm, Chest, Abdomen, Front perineal area, Buttocks     Bathing assist Assist Level: 2 Helpers     Upper Body Dressing/Undressing Upper body dressing   What is the patient wearing?: Hospital gown only    Upper body assist Assist Level: 2 Helpers    Lower Body Dressing/Undressing Lower body dressing      What is the patient wearing?: Hospital gown only, Incontinence brief     Lower body assist Assist for lower body dressing: 2 Helpers     Toileting Toileting    Toileting assist Assist for toileting: 2 Helpers     Transfers Chair/bed transfer  Transfers assist  Chair/bed transfer activity did not occur: Safety/medical concerns  Chair/bed transfer assist level: Dependent - mechanical lift     Locomotion Ambulation   Ambulation assist   Ambulation activity did not occur: Safety/medical concerns          Walk 10 feet activity   Assist  Walk 10 feet activity did not occur: Safety/medical concerns        Walk 50 feet activity   Assist Walk 50 feet with 2 turns activity did not occur: Safety/medical concerns         Walk 150 feet activity  Assist Walk 150 feet activity did not occur: Safety/medical concerns         Walk 10 feet on uneven surface  activity   Assist Walk 10 feet on uneven surfaces activity did not occur: Safety/medical concerns         Wheelchair     Assist     Wheelchair activity did not occur: Safety/medical concerns         Wheelchair 50 feet with 2 turns activity    Assist    Wheelchair 50 feet with 2 turns activity did not occur: Safety/medical concerns       Wheelchair 150 feet activity     Assist Wheelchair 150 feet activity  did not occur: Safety/medical concerns          Medical Problem List and Plan: 1.  Visual field deficits with gaze preference, visual hallucinations, difficulty keeping eyes open,  pusher tendencies and left bias with standing secondary to large intraventricular hemorrhage   Cont CIR  Weekend notes reviewed, discussed with physician  Repeat head CT reviewed, with increase in edema, await NS recs  Appreciate IM recs 2.  DVT Prophylaxis/Anticoagulation: Mechanical: Sequential compression devices, below knee Bilateral lower extremities 3. Headaches/Pain Management: Limit sedating medications. Limit hydrocodone as needed for severe HA.   Topamax DC'd 4. Mood: LCSW to follow for evaluation and support when appropriate. Resume home ativan prn at lower dose.  5. Neuropsych: This patient is not capable of making decisions on her own behalf 6. Skin/Wound Care: Routine pressure relief measures 7. Fluids/Electrolytes/Nutrition: Monitor I's and O's.    D2 thins, advance diet as tolerated 8.  E. coli UTI: Completed 7 days of ctx/amoxicillin  Foley DC'd on 11/11 9.  HTN: Blood pressure goals less than 160/90.  Continue metoprolol twice daily and increased Avapro to 150 mg for blood pressure control.  Borderline elevated on 11/11 10.  Seizure prophylaxis: Continue Keppra twice daily. 11.  Urinary retention: Discontinued Ditropan.  Continue Flomax routine pressure relief measures. 12.  Leukocytosis:   WBCs 11.7 on 11/11  Continue ABX per IM  Continue to monitor 13. Hyponatremia:   Sodium 130 and 11/11  On IVF  Continue to monitor 14. H/o Barrett's esophagus/GERD: On Protonix 15.  Hypokalemia  Potassium 3.5 on 11/11  Supplement increased on 11/1 16.  Hypoalbuminemia  Supplement initiated on 11/7 17.  Acute blood loss anemia  Hemoglobin 10.8 on 11/11 18.  Fevers and lethargy  Appreciate IM recs  Continue Zosyn 19.  Hypomagnesemia  Magnesium 1.4 on 11/11  Continue supplementation    LOS: 5 days A FACE TO FACE EVALUATION WAS PERFORMED   [name] 12/28/2017, 10:51 AM

## 2017-12-28 NOTE — Progress Notes (Signed)
Physical Therapy Session Note  Patient Details  Name: [name] MRN: [id number] Date of Birth: 1926-07-12  Today's Date: 12/28/2017 PT Individual Time: 0905-1005 and 1515-1530 PT Individual Time Calculation (min): 60 min and 15 min  Short Term Goals: Week 1:  PT Short Term Goal 1 (Week 1): Pt will perform bed mobility with max assist of 1 consistently  PT Short Term Goal 2 (Week 1): Pt will sit EOB x 3 min with mod assist  PT Short Term Goal 3 (Week 1): Pt will perform bed<>WC transfers with max assist  PT Short Term Goal 4 (Week 1): Pt will tolerate sitting in WC for up to 2 hours to prepare for out of bed mobility   Skilled Therapeutic Interventions/Progress Updates: Pt presented in bed with nsg present agreeable to therapy. Pt was able to open eyes on command and respond to questions appropriately throughout session. Prior to bed mobility nsg removed Foley Cath. Pt performed supine to sit with max to total A with pt able to initiate movement of LLE and reaching for bed rail with RUE with HOH assist. Pt required total A with use of draw sheet to scoot to EOB. Pt sat at EOB x approx 10 min with maxA due to pushing to L and PTA providing max multimodal cues for lean to R for midline orientation. PTA provided tactile cues for increased R lean however pt unable to maintain due to pushing tendencies. Participated in SB transfer to R total A x 2. Pt transported to rehab gyn for increased stimulus. PTA adjusted leg rests as pt would frequently slide BLE off foot plates. Pt becoming more lethargic towards end of session however was able to respond to PTA when stimulated. Pt transported back to room and remained in TIS with belt alarm placed and call bell within reach.   Tx2: Pt presented in bed with nsg just completing in/out cath. Pt able to respond to PTA appropriately. Attempted to have pt participate in supine therex however pt able to perform 1-2 ankle pumps and heel slides before falling  asleep. PTA discussed am's therapy with dgt who was present and discussed continued progression of rehab. Pt noted to be snoring during discussion with dgt. Pt left in bed with bed alarm on and current needs met. 30 min skilled therapy missed due to increased lethargy and nsg care.      Therapy Documentation Precautions:  Precautions Precautions: Fall Precaution Comments: pushing left, decreased attention, left hemiparesis Restrictions Weight Bearing Restrictions: No General:   Vital Signs:  Pain: Pain Assessment Pain Scale: Faces Faces Pain Scale: Hurts little more Pain Location: Generalized Pain Descriptors / Indicators: Grimacing;Discomfort Pain Onset: With Activity Pain Intervention(s): Repositioned   Therapy/Group: Individual Therapy  Phoebe Marter  Thompson Mckim, PTA  12/28/2017, 12:44 PM

## 2017-12-28 NOTE — Progress Notes (Signed)
Speech Language Pathology Daily Session Note  Patient Details  Name: [name] MRN: [id number] Date of Birth: 1927-01-11  Today's Date: 12/28/2017 SLP Individual Time: 1345-1415 SLP Individual Time Calculation (min): 30 min  Short Term Goals: Week 1: SLP Short Term Goal 1 (Week 1): Pt will consume dys 2 textures and thin liquids with min cues for use of swallowing precautions and minimal overt s/s of aspiration.  SLP Short Term Goal 2 (Week 1): Pt will sustain her attention to basic, functional tasks for 5 minutes with mod cues for redirection.   SLP Short Term Goal 3 (Week 1): Pt will complete basic, familiar tasks with mod assist for functional problem solving.   SLP Short Term Goal 4 (Week 1): Pt will locate items to the left of midline in ~50% of opportunities during basic, familiar tasks with mod assist.    Skilled Therapeutic Interventions:    Skilled treatment session focused on arousal and cognition. SLP received pt upright in tilt-in-space wheelchair. Pt with eyes closed. Multiple attempts and multiple activities tried to increased arousal. SLP repositioned pt in chair, moved her with no arousal. Cold cloth applied to forehead with no response. SLP handed off to nursing. Will continue attempts.   Pain Pain Assessment Pain Scale: Faces Pain Score: Asleep Faces Pain Scale: Hurts little more Pain Location: Generalized Pain Descriptors / Indicators: Grimacing;Discomfort Pain Onset: With Activity Pain Intervention(s): Repositioned  Therapy/Group: Individual Therapy  Darlyn Repsher 12/28/2017, 2:30 PM

## 2017-12-28 NOTE — Progress Notes (Signed)
Occupational Therapy Session Note  Patient Details  Name: [name] MRN: [id number] Date of Birth: November 19, 1926  Today's Date: 12/28/2017 OT Individual Time: 9373-4287 OT Individual Time Calculation (min): 55 min    Short Term Goals: Week 1:  OT Short Term Goal 1 (Week 1): Pt will transition to EOB with max +2 OT Short Term Goal 2 (Week 1): Pt will maintain EOB sitting balance with max +2 OT Short Term Goal 3 (Week 1): Pt will complete grooming task at sink with mod A OT Short Term Goal 4 (Week 1): Pt will don shirt with max A  Skilled Therapeutic Interventions/Progress Updates:    Pt up in the tilt in space chair to start session.  She maintained eyes closed the entire session, but would occasionally reply verbally the therapist's questions.  She was able to state her first name, place, and reason for hospitalization.  BP taken in sitting at 193/82 and 177/86.  She needed max hand over hand for initiation and completion of all bathing tasks.  Decreased sustained attention to task overall with total assist needed to maintain any upright sitting posture on the EOC.  Increased pushing to the left noted as well as LOB forward and backwards.  Pt maintained cervical flexion as well.  She did complete 90% of washing her face as well as 30% of washing the left arm and her stomach, after demonstrational cueing to initiate use.  Decreased thoroughness noted throughout.  Total assist +2 for donning pullover shirt sitting EOC as well as for donning pants sit to stand.  Pt resistant to positional changes at times, and when assisted to correct, would reply "your hurting me".  Pt with decreased awareness of situation and why she needed assistance to self correct.  Finished session with pt up in the tilt in space wheelchair with NT present to assist with self feeding.     Therapy Documentation Precautions:  Precautions Precautions: Fall Precaution Comments: pushing left, decreased attention, left  hemiparesis Restrictions Weight Bearing Restrictions: No  Pain: Pain Assessment Pain Scale: Faces Faces Pain Scale: Hurts little more Pain Location: Generalized Pain Descriptors / Indicators: Grimacing;Discomfort Pain Onset: With Activity Pain Intervention(s): Repositioned ADL: See Care Plan section for details  Therapy/Group: Individual Therapy  Jacquilyn Seldon OTR/L 12/28/2017, 12:26 PM

## 2017-12-29 ENCOUNTER — Inpatient Hospital Stay (HOSPITAL_COMMUNITY): Payer: Medicare Other | Admitting: *Deleted

## 2017-12-29 ENCOUNTER — Inpatient Hospital Stay (HOSPITAL_COMMUNITY): Payer: Medicare Other | Admitting: Physical Therapy

## 2017-12-29 ENCOUNTER — Inpatient Hospital Stay (HOSPITAL_COMMUNITY): Payer: Medicare Other | Admitting: Occupational Therapy

## 2017-12-29 ENCOUNTER — Inpatient Hospital Stay (HOSPITAL_COMMUNITY): Payer: Medicare Other

## 2017-12-29 ENCOUNTER — Inpatient Hospital Stay (HOSPITAL_COMMUNITY): Payer: Medicare Other | Admitting: Speech Pathology

## 2017-12-29 DIAGNOSIS — I169 Hypertensive crisis, unspecified: Secondary | ICD-10-CM

## 2017-12-29 DIAGNOSIS — Z515 Encounter for palliative care: Secondary | ICD-10-CM

## 2017-12-29 DIAGNOSIS — Z7189 Other specified counseling: Secondary | ICD-10-CM

## 2017-12-29 DIAGNOSIS — G936 Cerebral edema: Secondary | ICD-10-CM

## 2017-12-29 LAB — FOLATE RBC
FOLATE, RBC: 1484 ng/mL (ref >498)
Folate, Hemolysate: 433.3 ng/mL
HEMATOCRIT: 29.2 % — AB (ref 34.0–46.6)

## 2017-12-29 MED ORDER — METOPROLOL TARTRATE 50 MG PO TABS
50.0000 mg | ORAL_TABLET | Freq: Two times a day (BID) | ORAL | Status: DC
  Administered 2017-12-29 – 2018-01-18 (×38): 50 mg via ORAL
  Filled 2017-12-29 (×39): qty 1

## 2017-12-29 MED ORDER — DEXTROSE-NACL 5-0.9 % IV SOLN: INTRAVENOUS | Status: DC

## 2017-12-29 MED ORDER — PANTOPRAZOLE SODIUM 40 MG IV SOLR
40.0000 mg | Freq: Every day | INTRAVENOUS | Status: DC
  Administered 2017-12-29 – 2017-12-31 (×3): 40 mg via INTRAVENOUS
  Filled 2017-12-29 (×3): qty 40

## 2017-12-29 MED ORDER — SODIUM CHLORIDE 0.9 % IV SOLN
INTRAVENOUS | Status: DC
  Administered 2017-12-29 – 2017-12-30 (×2): via INTRAVENOUS

## 2017-12-29 MED ORDER — RANITIDINE HCL 150 MG/10ML PO SYRP
150.0000 mg | ORAL_SOLUTION | Freq: Two times a day (BID) | ORAL | Status: DC
  Administered 2017-12-29 – 2018-01-06 (×18): 150 mg via ORAL
  Filled 2017-12-29 (×20): qty 10

## 2017-12-29 MED ORDER — DEXAMETHASONE SODIUM PHOSPHATE 4 MG/ML IJ SOLN
4.0000 mg | Freq: Four times a day (QID) | INTRAMUSCULAR | Status: DC
  Administered 2017-12-29 – 2018-01-01 (×12): 4 mg via INTRAVENOUS
  Filled 2017-12-29 (×13): qty 1

## 2017-12-29 MED ORDER — ACETAMINOPHEN 325 MG PO TABS
650.0000 mg | ORAL_TABLET | ORAL | Status: DC
  Administered 2017-12-29 – 2018-01-06 (×41): 650 mg via ORAL
  Filled 2017-12-29 (×41): qty 2

## 2017-12-29 MED ORDER — DEXAMETHASONE SODIUM PHOSPHATE 4 MG/ML IJ SOLN: 4.0000 mg | Freq: Four times a day (QID) | INTRAMUSCULAR | Status: DC

## 2017-12-29 NOTE — Progress Notes (Signed)
Speech Language Pathology Daily Session Note  Patient Details  Name: [name] MRN: [id number] Date of Birth: 09/05/1926  Today's Date: 12/29/2017 SLP Individual Time: 1005-1045 SLP Individual Time Calculation (min): 40 min  Short Term Goals: Week 1: SLP Short Term Goal 1 (Week 1): Pt will consume dys 2 textures and thin liquids with min cues for use of swallowing precautions and minimal overt s/s of aspiration.  SLP Short Term Goal 2 (Week 1): Pt will sustain her attention to basic, functional tasks for 5 minutes with mod cues for redirection.   SLP Short Term Goal 3 (Week 1): Pt will complete basic, familiar tasks with mod assist for functional problem solving.   SLP Short Term Goal 4 (Week 1): Pt will locate items to the left of midline in ~50% of opportunities during basic, familiar tasks with mod assist.    Skilled Therapeutic Interventions:  Pt was seen for skilled ST targeting dysphagia goals and cognitive goals.  Pt was more alert today in comparison to previous therapy sessions and requested to use the bedpan to have a bowel movement.  Pt had already been incontinent of bowel and needed mod-max cues for rolling in bed due to left inattention and pt keeping eyes closed throughout mobility.  Pt could direct her care to convey to nursing that she is lactose intolerant and had consumed dairy this morning.  Pt with reports of nausea after loose bowel movement.  Offered pt saltine crackers and ginger ale as a trial of advanced textures.  Pt had prolonged mastication of dry textures which cleared with a liquid wash.  As session progressed, pt became more fatigued and confused.  Speech became difficult to understand but pt appeared to have some language of confusion.  Due to ongoing reports of lethargy, I would still recommend that pt remain on dys 2 textures and thin liquids.  Pt was left in bed with bed alarm set and call bell within reach.    Pain Pain Assessment Pain Scale:  0-10 Pain Score: 4  Pain Type: Acute pain Pain Location: Head Pain Orientation: Right;Left Pain Descriptors / Indicators: Headache Pain Frequency: Constant Pain Onset: On-going Patients Stated Pain Goal: 2 Pain Intervention(s): (premedicated prior to therapist's arrival )  Therapy/Group: Individual Therapy  Lydon Vansickle, Selinda Orion 12/29/2017, 12:32 PM

## 2017-12-29 NOTE — Progress Notes (Signed)
Neurosurgery contacted for input and PA callback.  Discussed patient's lethargy as well as declining activity.  He recommended repeat CT of head for monitoring.  No plans for surgical intervention unless CT is worse.  He concurs with palliative care consult.  Discussed patient with nursing--air mattress ordered for pressure relief measures due to decreased mobility.  She did eat some yogurt this a.m.  I was able to engage patient this a.m. after stimulating her with sternal rub.  She briefly opened her eyes but kept them closed for most of the interaction. She continues to complain about headaches as well as abdominal discomfort---discussed addition of steroids to help with symptoms. She felt that "medications are not working". Will change protonix to IV and add zantac to help manage dyspepsia likely from medications as well as steroids with poor intake.

## 2017-12-29 NOTE — Progress Notes (Signed)
Pt returned to unit via bed. Side rails up x 3. Call bell in reach. No c/o pain. Pt still drowsy. Daughter at bedside. Has concerns about dentures missing. Will talk to charge nurse and find out info for daughter, will cont to monitor.   Erie Noe, LPN

## 2017-12-29 NOTE — Consult Note (Signed)
Consultation Note Date: 12/29/2017   Patient Name: [name]  DOB: [birth year]  MRN: [id number]  Age / Sex: 84 y.o., female  PCP: Sonya Seashore, MD Referring Physician: Aline August, MD  Reason for Consultation: Establishing goals of care  HPI/Patient Profile: 84 y.o.femalewith history of dementia, chronic atrial fibrillation on Xarelto until recently, pulmonary hypertension, Barrett's esophagus who was recently admitted on 12/18/2017 patient for a large intraventricular hemorrhage which was treated conservatively along with E. coli UTI which was initially treated with Rocephin and switched to Keflex and discharged to inpatient rehab on 12/23/2017. Current consult as patient has remained lethargic with increase in intracranial edema.  Clinical Assessment and Goals of Care: Patient is resting in bed. Staff is currently working with her. Spoke with her daughter [name]. [name] states her mother has siblings, but they are in [place]. [name] is widowed.   [name] lives with [name]. Functionally, [name] states prior to the stroke 11/1, her mother was able to bathe, feed herself, and walked around in the yard. She got the mail and the trash cans. She did not like to cook, but she would help [employer] if needed. She had a morning routine of fixing coffee and making breakfast.   [name] has substantial stress as her husband is currently a patient at [facility]. She states there are chronic health conditions and acute concerns that will make it difficult to care for her mother and her husband. She has 3 children herself (grandchildren for Ms. Beel), but only 1 that can be of assistance to her.   Will meet tomorrow for [place] conversation at 12:00.     SUMMARY OF RECOMMENDATIONS   GOC tomorrow at 12:00  Code Status/Advance Care Planning:  DNR    Symptom Management:   Per primary  team.  Palliative Prophylaxis:   Eye Care and Oral Care   Prognosis:   Poor overall.  Discharge Planning: To Be Determined      Primary Diagnoses: Present on Admission: . ICH (intracerebral hemorrhage) ([place])   I have reviewed the medical record, interviewed the patient and family, and examined the patient. The following aspects are pertinent.  Past Medical History:  Diagnosis Date  . Ankle fracture   . Anxiety   . Arthritis   . Barrett's esophagus   . Chronic atrial fibrillation    a. Pt previously declined DCCV.  Marland Kitchen Coronary artery disease    a. s/p Cypher DES to LAD in 2006;  b. Last LHC 02/2008:  pLAD 30%, mLAD stent patent, pOM 50%, EF 65%;  c. Lexiscan Myoview 7/14:  Intermediate risk, dist ant and inf-lat ischemia, EF 66% - patient declined cath and elected medical therapy.  Marland Kitchen GERD (gastroesophageal reflux disease)   . Hiatal hernia   . Hx of echocardiogram    Echocardiogram 08/17/12: EF 55-60%, MAC, mild MR, mild LAE, mild RVE, mild to moderate RAE, moderate TR, mildly increased pulmonary artery systolic pressures  . Hyperlipidemia   . Hypertension   . Mitral regurgitation  a. Mod by echo 10/2014.  . Obesity   . Pulmonary hypertension ([place])    a. Mod-severely elevated PA pressures by echo 10/2014.  Marland Kitchen QT prolongation    a. During 10/2014 admit, QTC 545m.  . Tricuspid regurgitation    a. Mod by echo 10/2014.  .Marland KitchenUTI (lower urinary tract infection) 03/2015   Social History   Socioeconomic History  . Marital status: Widowed    Spouse name: Not on file  . Number of children: 1  . Years of education: Not on file  . Highest education level: Not on file  Occupational History  . Occupation: Retired  SScientific laboratory technician . Financial resource strain: Not on file  . Food insecurity:    Worry: Not on file    Inability: Not on file  . Transportation needs:    Medical: Not on file    Non-medical: Not on file  Tobacco Use  . Smoking status: Former Smoker    Types:  Cigarettes  . Smokeless tobacco: Never Used  Substance and Sexual Activity  . Alcohol use: No    Alcohol/week: 0.0 standard drinks  . Drug use: No  . Sexual activity: Never  Lifestyle  . Physical activity:    Days per week: Not on file    Minutes per session: Not on file  . Stress: Not on file  Relationships  . Social connections:    Talks on phone: Not on file    Gets together: Not on file    Attends religious service: Not on file    Active member of club or organization: Not on file    Attends meetings of clubs or organizations: Not on file    Relationship status: Not on file  Other Topics Concern  . Not on file  Social History Narrative  . Not on file   Family History  Problem Relation Age of Onset  . Diabetes Mother   . Heart attack Mother   . Lung cancer Father   . Thyroid disease Sister   . Thyroid disease Sister   . Hypertension Sister   . Hypertension Sister   . Hypertension Sister   . Hypertension Sister   . Hypertension Brother   . Hypertension Brother   . Breast cancer Other 45  . Colon cancer Neg Hx   . Colon polyps Neg Hx   . Esophageal cancer Neg Hx   . Stroke Neg Hx    Scheduled Meds: . acetaminophen  650 mg Oral Q4H  . calcium carbonate  1 tablet Oral Q breakfast  . cholecalciferol  1,000 Units Oral Daily  . dexamethasone  4 mg Intravenous Q6H  . docusate sodium  100 mg Oral BID  . feeding supplement (PRO-STAT SUGAR FREE 64)  30 mL Oral BID  . irbesartan  150 mg Oral Daily  . magnesium oxide  400 mg Oral BID  . mouth rinse  15 mL Mouth Rinse BID  . metoprolol tartrate  50 mg Oral BID  . pantoprazole (PROTONIX) IV  40 mg Intravenous Q0600  . polyethylene glycol  17 g Oral Daily  . potassium chloride  30 mEq Oral BID  . ranitidine  150 mg Oral BID  . tamsulosin  0.4 mg Oral Daily   Continuous Infusions: . sodium chloride 50 mL/hr at 12/29/17 1002   PRN Meds:.acetaminophen, alum & mag hydroxide-simeth, bisacodyl, cloNIDine,  guaiFENesin-dextromethorphan, lidocaine, nitroGLYCERIN, ondansetron (ZOFRAN) IV, polyethylene glycol, prochlorperazine **OR** prochlorperazine **OR** prochlorperazine, simethicone, sodium phosphate Medications Prior to Admission:  Prior to Admission medications   Medication Sig Start Date End Date Taking? Authorizing Provider  acetaminophen (TYLENOL) 500 MG tablet Take 500 mg by mouth every 6 (six) hours as needed for mild pain.    [provider]  calcium carbonate (OS-CAL) 600 MG TABS Take 600 mg by mouth daily with breakfast.     [provider]  carboxymethylcellulose (REFRESH PLUS) 0.5 % SOLN Place 1 drop into both eyes as needed (dry eyes).    [provider]  Chlorphen-Phenyleph-ASA (ALKA-SELTZER PLUS COLD PO) Take 1 tablet by mouth daily as needed (cold symptoms).    [provider]  Cholecalciferol (VITAMIN D-3) 1000 UNITS CAPS Take 1 capsule by mouth daily.    [provider]  levETIRAcetam (KEPPRA) 500 MG tablet Take 1 tablet (500 mg total) by mouth 2 (two) times daily. 12/23/17   Costella, Vista Mink, PA-C  LORazepam (ATIVAN) 0.5 MG tablet Take 1 tablet (0.5 mg total) by mouth every 8 (eight) hours as needed for anxiety. 10/12/14   Josue Hector, MD  magnesium chloride (SLOW-MAG) 64 MG TBEC SR tablet Take 1 tablet (64 mg total) by mouth 2 (two) times daily. 10/13/14   Janece Canterbury, MD  metoprolol tartrate (LOPRESSOR) 25 MG tablet TAKE ONE TABLET BY MOUTH TWICE A DAY 06/29/17   Josue Hector, MD  nitroGLYCERIN (NITROSTAT) 0.4 MG SL tablet Place 1 tablet (0.4 mg total) under the tongue every 5 (five) minutes as needed for chest pain (3 doses only). 10/12/14   Josue Hector, MD  omeprazole (PRILOSEC) 20 MG capsule Take 20 mg by mouth daily.    [provider]  oxybutynin (DITROPAN) 5 MG tablet Take 5 mg by mouth daily.    [provider]  potassium chloride SA (K-DUR,KLOR-CON) 20 MEQ tablet Take 1 tablet by mouth daily.  11/10/16   [provider]  telmisartan (MICARDIS) 20 MG tablet Take 1 tablet by mouth daily. 01/29/17   [provider]  vitamin B-12 (CYANOCOBALAMIN) 1000 MCG tablet Take 1,000 mcg by mouth daily.    [provider]  vitamin C (ASCORBIC ACID) 500 MG tablet Take 500 mg by mouth daily.    [provider]  vitamin E 400 UNIT capsule Take 400 Units by mouth daily.    [provider]   Allergies  Allergen Reactions  . Amlodipine Shortness Of Breath and Swelling    Edema in legs  . Methyldopa Shortness Of Breath and Swelling  . Actonel [Risedronate Sodium] Other (See Comments)    Caused a lump to come up in throat  . Ciprofloxacin Hcl     hallucinations  . Hctz [Hydrochlorothiazide] Itching    Pt reports causing "itching under the skin, with no rash noted."  . Hydralazine Hcl     Unknown, per pt   . Lisinopril Cough  . Oxycodone Hcl     hallucinations  . Spironolactone     Itchiing  . Tape Itching  . Valsartan     Unknown, per pt   Review of Systems  Unable to perform ROS   Physical Exam  Constitutional: No distress.  Pulmonary/Chest: Effort normal.    Vital Signs: BP (!) 149/81 (BP Location: Right Arm)   Pulse 66   Temp 97.6 F (36.4 C)   Resp 18   Ht _0  (1.651 m)   Wt 84.1 kg   SpO2 99%   BMI 30.85 kg/m  Pain Scale: Faces POSS *See Group Information*: 2-Acceptable,Slightly drowsy, easily  aroused Pain Score: 0-No pain   SpO2: SpO2: 99 % O2 Device:SpO2: 99 % O2 Flow Rate: .   IO: Intake/output summary:   Intake/Output Summary (Last 24 hours) at 12/29/2017 1553 Last data filed at 12/29/2017 0500 Gross per 24 hour  Intake 240 ml  Output 1325 ml  Net -1085 ml    LBM: Last BM Date: 12/29/17 Baseline Weight: Weight: 84.1 kg Most recent weight: Weight: 84.1 kg     Palliative Assessment/Data: 40%     Time In: 3:35 Time Out: 4:10 Time Total: 35 min Greater than 50%  of this time was spent counseling and  coordinating care related to the above assessment and plan.  Signed by: Asencion Gowda, NP   Please contact Palliative Medicine Team phone at [phone] for questions and concerns.  For individual provider: See Shea Evans

## 2017-12-29 NOTE — Progress Notes (Addendum)
[facility] PHYSICAL MEDICINE & REHABILITATION PROGRESS NOTE  Subjective/Complaints: Patient seen laying in bed this morning.  She slept well overnight per sleep chart.  Patient difficult to arouse.  Discussed with nursing nurse tech, stating patient slept well overnight.  She needed to be I/O cath and stated some discomfort with cath.  ROS: Limited due to cognition  Objective: Vital Signs: Blood pressure (!) 179/94, pulse 74, temperature 97.6 F (36.4 C), resp. rate 16, height 5\' 5"  (1.651 m), weight 84.1 kg, SpO2 98 %. No results found. Recent Labs    12/27/17 0620 12/28/17 0607  WBC 10.5 11.7*  HGB 10.5* 10.8*  HCT 33.2* 33.3*  PLT 154 237   Recent Labs    12/27/17 0620 12/28/17 0607  NA 133* 130*  K 3.7 3.5  CL 103 99  CO2 23 23  GLUCOSE 132* 120*  BUN 13 11  CREATININE 0.81 0.65  CALCIUM 8.3* 8.5*    Physical Exam: BP (!) 179/94 (BP Location: Left Arm)   Pulse 74   Temp 97.6 F (36.4 C)   Resp 16   Ht 5\' 5"  (1.651 m)   Wt 84.1 kg   SpO2 98%   BMI 30.85 kg/m  Constitutional: No distress . Vital signs reviewed. HENT: Normocephalic.  Atraumatic. Eyes: EOMI. No discharge. Cardiovascular: Irregularly irregular.  No JVD. Respiratory: CTA bilaterally.  Normal effort. GI: BS +. Non-distended. Musc: No edema or tenderness in extremities. Musculoskeletal: Generalized edema Neurological:  Lethargic, difficult to arouse.  Dysarthria  Motor: Limited due to participation Previously:  RUE: 4/5 proximal to distal   RLE: 3/5 proximal to distal (?effort)   LUE: 3+/5 proximal to distal (?effort)   LLE: 0/5 proximal to distal   (? effort) Skin: Skin is warm and dry.  Scattered ecchymotic areas.   Psychiatric: Unable to assess due to mentation   Assessment/Plan: 1. Functional deficits secondary to right intraventricular hemorrhage which require 3+ hours per day of interdisciplinary therapy in a comprehensive inpatient rehab setting.  Physiatrist is providing close  team supervision and 24 hour management of active medical problems listed below.  Physiatrist and rehab team continue to assess barriers to discharge/monitor patient progress toward functional and medical goals  Care Tool:  Bathing    Body parts bathed by patient: Right upper leg, Face   Body parts bathed by helper: Right arm, Left arm, Chest, Abdomen, Front perineal area, Buttocks, Right lower leg, Left lower leg     Bathing assist Assist Level: 2 Helpers     Upper Body Dressing/Undressing Upper body dressing   What is the patient wearing?: Pull over shirt    Upper body assist Assist Level: 2 Helpers    Lower Body Dressing/Undressing Lower body dressing      What is the patient wearing?: Pants     Lower body assist Assist for lower body dressing: 2 Helpers     Toileting Toileting    Toileting assist Assist for toileting: 2 Helpers     Transfers Chair/bed transfer  Transfers assist  Chair/bed transfer activity did not occur: Safety/medical concerns  Chair/bed transfer assist level: 2 Helpers(slide board)     Locomotion Ambulation   Ambulation assist   Ambulation activity did not occur: Safety/medical concerns          Walk 10 feet activity   Assist  Walk 10 feet activity did not occur: Safety/medical concerns        Walk 50 feet activity   Assist Walk 50 feet with 2  turns activity did not occur: Safety/medical concerns         Walk 150 feet activity   Assist Walk 150 feet activity did not occur: Safety/medical concerns         Walk 10 feet on uneven surface  activity   Assist Walk 10 feet on uneven surfaces activity did not occur: Safety/medical concerns         Wheelchair     Assist     Wheelchair activity did not occur: Safety/medical concerns         Wheelchair 50 feet with 2 turns activity    Assist    Wheelchair 50 feet with 2 turns activity did not occur: Safety/medical concerns        Wheelchair 150 feet activity     Assist Wheelchair 150 feet activity did not occur: Safety/medical concerns          Medical Problem List and Plan: 1.  Visual field deficits with gaze preference, visual hallucinations, difficulty keeping eyes open,  pusher tendencies and left bias with standing secondary to large intraventricular hemorrhage   Cont CIR  Repeat head CT reviewed, with increase in edema, discussed with neurosurgery previously, no changes in management. Will follow up again.  Appreciate IM recs  IV steroids started on 11/11, increased on 11/12  We will discuss with palliative care 2.  DVT Prophylaxis/Anticoagulation: Mechanical: Sequential compression devices, below knee Bilateral lower extremities 3. Headaches/Pain Management: Limit sedating medications. Limit hydrocodone as needed for severe HA.   Topamax DC'd 4. Mood: LCSW to follow for evaluation and support when appropriate. Resume home ativan prn at lower dose.  5. Neuropsych: This patient is not capable of making decisions on her own behalf 6. Skin/Wound Care: Routine pressure relief measures 7. Fluids/Electrolytes/Nutrition: Monitor I's and O's.    D2 thins, advance diet as tolerated  Essentially no nutritional intake, normal saline started 11/12 8.  E. coli UTI: Completed 7 days of ctx/amoxicillin  Foley DC'd on 11/11 9.  HTN: Blood pressure goals less than 160/90.  Continue metoprolol twice daily and increased Avapro to 150 mg for blood pressure control.  Hypertensive crisis overnight, likely exacerbated by steroids 10.  Seizure prophylaxis: Continue Keppra twice daily, DC'd on 11/12. 11.  Urinary retention: Discontinued Ditropan.  Continue Flomax routine pressure relief measures. 12.  Leukocytosis:   WBCs 11.7 on 11/11  Labs ordered for tomorrow  ABX per IM DC'd on 11/11  Continue to monitor 13. Hyponatremia:   Sodium 130 and 11/11  Labs ordered for tomorrow  IVF DC'd by IM, restarted on  11/12  Continue to monitor 14. H/o Barrett's esophagus/GERD: On Protonix 15.  Hypokalemia  Potassium 3.5 on 11/11  Labs ordered for tomorrow  Supplement increased on 11/1 16.  Hypoalbuminemia  Supplement initiated on 11/7 17.  Acute blood loss anemia  Hemoglobin 10.8 on 11/11 18.  Fevers and lethargy: Fevers resolved, lethargy persists  Appreciate IM recs  Antibiotics DC'd 19.  Hypomagnesemia  Magnesium 1.4 on 11/11  Labs ordered for tomorrow  Continue supplementation   LOS: 6 days A FACE TO FACE EVALUATION WAS PERFORMED  Baili Stang [name] 12/29/2017, 9:09 AM

## 2017-12-29 NOTE — Progress Notes (Signed)
Physical Therapy Session Note  Patient Details  Name: [name] MRN: [id number] Date of Birth: 08-10-1926  Today's Date: 12/29/2017 PT Individual Time: 1130-1200 PT Individual Time Calculation (min): 30 min   Short Term Goals: Week 1:  PT Short Term Goal 1 (Week 1): Pt will perform bed mobility with max assist of 1 consistently  PT Short Term Goal 2 (Week 1): Pt will sit EOB x 3 min with mod assist  PT Short Term Goal 3 (Week 1): Pt will perform bed<>WC transfers with max assist  PT Short Term Goal 4 (Week 1): Pt will tolerate sitting in WC for up to 2 hours to prepare for out of bed mobility   Skilled Therapeutic Interventions/Progress Updates:    Pt received seated in bed asleep. Pt unable to be awoken with therapist calling patient's name and gentle shaking. With sternal rub pt is able to be slightly aroused. Cold washcloth to forehead to further wake pt up. Pt is able to respond to therapist verbally once cold washcloth wakes her up. Pt keeps her eyes closed throughout most of therapy session and reports feeling very "weak" and that she cannot keep her eyes open. Pt is able to engage in conversation throughout session despite keeping her eyes closed. Hand-over-hand assist to bring cup up to her mouth with R hand to take small sips of ginger ale. Verbal cues for small sips and to swallow between each sip. Seated forward leans with max A to initiate and max A to maintain sitting balance with no back support. Pt fatigues quickly with activity and reports onset of back pain with activity, subsides at rest. Pt requests to use bedpan. Rolling to the L with max A from one person and dependent to place bedpan from 2nd person. Pt left supine in bed on bedpan with call button in reach, RN aware.  Therapy Documentation Precautions:  Precautions Precautions: Fall Precaution Comments: pushing left, decreased attention, left hemiparesis Restrictions Weight Bearing Restrictions:  No   Therapy/Group: Individual Therapy  Excell Seltzer, PT, DPT  12/29/2017, 2:27 PM

## 2017-12-29 NOTE — Progress Notes (Signed)
Physical Therapy Session Note  Patient Details  Name: [name] MRN: [id number] Date of Birth: 1927-02-14  Today's Date: 12/29/2017 PT Individual Time: 0900-0945 PT Individual Time Calculation (min): 45 min   Short Term Goals: Week 1:  PT Short Term Goal 1 (Week 1): Pt will perform bed mobility with max assist of 1 consistently  PT Short Term Goal 2 (Week 1): Pt will sit EOB x 3 min with mod assist  PT Short Term Goal 3 (Week 1): Pt will perform bed<>WC transfers with max assist  PT Short Term Goal 4 (Week 1): Pt will tolerate sitting in WC for up to 2 hours to prepare for out of bed mobility   Skilled Therapeutic Interventions/Progress Updates: Pt presented in bed sleeping, required increased stimulation with PTA applying cold washcloth, turning on lights and initiating sternal rub. Pt was able to open eyes and initiate wiping face with washcloth however pt required tactile cues to complete task. PTA then attempted to engage pt in meaningful conversation to sustain alertness. PTA also encouraged pt to eat breakfast, with pt eating 2-3 bites of food then refusing additional food, except for several small sips of ensure. PTA repositioned pt to maintain neutral positioning. Pt left in bed sleeping with nursing aware.      Therapy Documentation Precautions:  Precautions Precautions: Fall Precaution Comments: pushing left, decreased attention, left hemiparesis Restrictions Weight Bearing Restrictions: No General:   Vital Signs: Therapy Vitals Pulse Rate: 66 Resp: 18 BP: (!) 149/81 Patient Position (if appropriate): Lying Oxygen Therapy SpO2: 99 % O2 Device: Room Air Pain: Pain Assessment Pain Scale: Faces Pain Score: 0-No pain Pain Type: Acute pain Pain Location: Head Pain Descriptors / Indicators: Headache Pain Intervention(s): (premedicated prior to therapist's arrival )    Therapy/Group: Individual Therapy  Eliana Lueth  Donyale Berthold, PTA  12/29/2017,  4:27 PM

## 2017-12-29 NOTE — Progress Notes (Signed)
Pt being transported to CT via bed. Side rails up x 4. Pt asleep at current time. Difficult to arouse. Will f/up when return.   Erie Noe, LPN 0:34 PM, 74/25/95

## 2017-12-29 NOTE — Progress Notes (Signed)
Recreational Therapy Session Note  Patient Details  Name: ANELISE STARON MRN: [id number] Date of Birth: 1926-03-04 Today's Date: 12/29/2017  Order received. TR eval deferred as pt is not yet appropriate for TR services.  Will continue to monitor through team for future participation. Jo-Ann Johanning 12/29/2017, 2:15 PM

## 2017-12-29 NOTE — Progress Notes (Signed)
Occupational Therapy Weekly Progress Note  Patient Details  Name: [name] MRN: [id number] Date of Birth: [birth year]  Beginning of progress report period: December 24, 2017 End of progress report period: December 29, 2017  Today's Date: 12/29/2017 OT Individual Time: 6294-7654 OT Individual Time Calculation (min): 16 min    Patient has met 0 of 4 short term goals.  Pt is making very little progress with OT at this time.  Currently, she continues to demonstrate decreased focused attention to ADL tasks.  She maintains eyes closed during session but will verbally respond to therapist's questions approximately 50% of the time.  She is oriented to place and situation as well.  Max hand over hand assist is needed to initiate simple bathing and grooming with the non-involved RUE during selfcare, as well as max assist for hand over hand for use of the LUE as well.  Total assist is needed for static sitting balance secondary to pusher tendencies to the left and LOB forward and backwards.  Total +2 assist is needed for squat pivot transfers from surface to surface as well as for sit to stand.  Bathing is max assist for UB and total +2 for LB.  Recommend continued CIR level therapy at this time, however feel she may need to be reduced to 2.5 hrs over 7 day period with hopes of her demonstrating increased attention and participation.  Will keep LTGs at current level of min assist and re-eval and adjust based on progress next week if needed.    Patient continues to demonstrate the following deficits: muscle weakness, impaired timing and sequencing, abnormal tone, unbalanced muscle activation and decreased coordination, decreased midline orientation, decreased initiation, decreased attention, decreased awareness, decreased problem solving, decreased safety awareness, decreased memory and delayed processing and decreased sitting balance, decreased standing balance, decreased postural control, hemiplegia and  decreased balance strategies and therefore will continue to benefit from skilled OT intervention to enhance overall performance with BADL and Reduce care partner burden.  Patient progressing toward long term goals..  Continue plan of care.  OT Short Term Goals Week 2:  OT Short Term Goal 1 (Week 2): Pt will maintain sustained attention for 15 mins to ADL task with no more than min instructional cueing to keep her eyes open.  OT Short Term Goal 2 (Week 2): Pt will complete UB bathing with mod instructional cueing and min assist in supported sitting.  OT Short Term Goal 3 (Week 2): Pt will sit EOB with mod assist for static sitting balance for at least 2 mins in preparation for selfcare tasks or transfers. OT Short Term Goal 4 (Week 2): Pt will perform sit to stand with max assist during LB selfcare tasks.  OT Short Term Goal 5 (Week 2): Pt will use the LUE as a gross assist with max hand over hand facilitation for washing the right arm during bathing tasks.   Skilled Therapeutic Interventions/Progress Updates:    Pt in bed with eyes closed to start session.  Attempted to arouse pt. However she would not open her eyes.  She did make a few noises to therapist physical stimuli with chest rub, but did not make any word statements to questions.  Therapist attempted to hold her eye open to see if she would follow a moving finger or acknowledge seeing the finger, but she would not respond.  Placed cold washcloth on her face as well to help increase arousal, without any affective change noted.  CT came in at this  time to transport her for scan.  Pt missed 40 mins of OT secondary to arousal and procedure.    Therapy Documentation Precautions:  Precautions Precautions: Fall Precaution Comments: pushing left, decreased attention, left hemiparesis Restrictions Weight Bearing Restrictions: No General: General OT Amount of Missed Time: 40 Minutes  Pain: Pain Assessment Pain Scale: Faces Pain Score: 0-No  pain Pain Type: Acute pain Pain Location: Head Pain Orientation: Right;Left Pain Descriptors / Indicators: Headache Pain Frequency: Constant Pain Onset: On-going Patients Stated Pain Goal: 2 Pain Intervention(s): (premedicated prior to therapist's arrival )  Therapy/Group: Individual Therapy  Tyreque Finken OTR/L 12/29/2017, 1:35 PM

## 2017-12-29 NOTE — Progress Notes (Signed)
Patient ID: [name], female   DOB: 1926/06/01, 84 y.o.   MRN: [id number]  PROGRESS NOTE    [name]  FOY:[id number] DOB: 05-15-1926 DOA: 12/23/2017 PCP: Merrilee Seashore, MD   Brief Narrative:  84 y.o. female with history of dementia, chronic atrial fibrillation on Xarelto until recently, pulmonary hypertension, Barrett's esophagus who was recently admitted on 12/18/2017 patient for a large intraventricular hemorrhage which was treated conservatively along with E. coli UTI which was initially treated with Rocephin and switched to Keflex and discharged to inpatient rehab on 12/23/2017.  Hospitalist service was consulted on 12/25/2017 for fever and increased lethargy.  Patient was started on broad-spectrum antibiotics on 12/25/2017.   Assessment & Plan:   Active Problems:   Intraventricular hemorrhage (HCC)   ICH (intracerebral hemorrhage) (HCC)   Acute blood loss anemia   Hypoalbuminemia due to protein-calorie malnutrition (HCC)   Hypomagnesemia   Dysphagia, post-stroke   Lethargy   Hypertensive crisis   Cerebral edema (HCC)   Fever with lethargy -Questionable cause.   Vancomycin was discontinued on 12/27/2017.  Zosyn was discontinued on 12/28/2017.  Afebrile.  Patient was recently on Rocephin as well for treatment for E. coli UTI.  No evidence of any infection currently.  Cultures negative so far.  Monitor off antibiotics. -CT of the head on 12/25/2017 showed no new bleeding but increased vasogenic edema in the right temporal lobe.  Recommend neurosurgery reevaluation.  Patient has been started on Decadron as well. -Patient is still lethargic at the time of my examination.  Unclear if patient is having nonconvulsive seizures.  Recommend neurology evaluation and EEG per primary team -Patient was started on Topamax recently which can give drowsiness and fever.  Topamax has been discontinued since 12/25/2017 -Keppra can cause lethargy.    Keppra has been discontinued on  12/29/2017  Leukocytosis -Mild.  Recent E. coli UTI -Status post completion of antibiotic treatment.  E. coli was pansensitive  Recent intraventricular hemorrhage -Conservatively treated.  Recommend neurosurgery evaluation for lethargy and worsening right temporal lobe edema seen on CAT scan  Hypertension -Blood pressure on the higher side after initiation of steroids.  Increase metoprolol to 50 milligrams twice a day.  Continue irbesartan and dose can be increased up to 300 mg daily.  Chronic atrial fibrillation -Rate controlled.  Continue metoprolol.  Off Xarelto since recent intraventricular hemorrhage  Hypokalemia -Replaced.  Improved  Hyponatremia -Stable.  IV fluids has been restarted by primary team because of poor oral intake.  Generalized deconditioning -Patient has remained lethargic for so many days now with not much improvement.  Consider getting palliative care involved and hospice/comfort measures.  -Overall prognosis is very poor.    Antimicrobials: Vancomycin 12/25/2017-12/27/2017 Zosyn from 12/25/2017-12/28/2017 Patient was on Rocephin from 12/18/2017-12/23/2017 and subsequently on Keflex from 12/23/2017-12/24/2017  Subjective: Patient seen and examined at bedside.  She is very drowsy, hardly wakes up or answers any questions. Objective: Vitals:   12/28/17 0525 12/28/17 1345 12/28/17 1951 12/29/17 0530  BP: (!) 161/78 (!) 156/82 (!) 190/89 (!) 179/94  Pulse: 79 65 73 74  Resp: 18 19 18 16   Temp: 98.9 F (37.2 C) 97.7 F (36.5 C) 98.6 F (37 C) 97.6 F (36.4 C)  TempSrc: Oral Oral Oral   SpO2: 98% 98% 99% 98%  Weight:      Height:        Intake/Output Summary (Last 24 hours) at 12/29/2017 1100 Last data filed at 12/29/2017 0500 Gross per 24 hour  Intake 240 ml  Output 1750 ml  Net -1510 ml   Filed Weights   12/23/17 1624  Weight: 84.1 kg    Examination:  General exam: No acute distress.  Drowsy, hardly wakes up respiratory system:  Bilateral decreased breath sounds at bases with some scattered crackles Cardiovascular system: Rate controlled, S1-S2 heard Gastrointestinal system: Abdomen is nondistended, soft and nontender. Normal bowel sounds heard. Extremities: No cyanosis; trace edema   Data Reviewed: I have personally reviewed following labs and imaging studies  CBC: Recent Labs  Lab 12/24/17 0548 12/25/17 0458 12/26/17 0844 12/27/17 0620 12/28/17 0607  WBC 10.5 12.7* 12.8* 10.5 11.7*  NEUTROABS 7.3 9.1*  --  6.6  --   HGB 10.4* 10.9* 10.4* 10.5* 10.8*  HCT 30.7* 33.8* 30.4* 33.2* 33.3*  MCV 88.2 88.5 87.1 89.7 88.3  PLT 177 230 203 154 646   Basic Metabolic Panel: Recent Labs  Lab 12/24/17 1227 12/24/17 1823 12/25/17 0458 12/26/17 0610 12/27/17 0620 12/28/17 0607  NA  --  133* 132* 129* 133* 130*  K  --  3.4* 3.5 2.9* 3.7 3.5  CL  --  96* 95* 97* 103 99  CO2  --  30 28 23 23 23   GLUCOSE  --  102* 98 158* 132* 120*  BUN  --  11 12 16 13 11   CREATININE  --  0.60 0.65 0.84 0.81 0.65  CALCIUM  --  8.9 8.8* 8.4* 8.3* 8.5*  MG 1.5*  --   --   --  1.5* 1.4*   GFR: Estimated Creatinine Clearance: 51 mL/min (by C-G formula based on SCr of 0.65 mg/dL). Liver Function Tests: Recent Labs  Lab 12/24/17 0548 12/26/17 0610 12/27/17 0620  AST 35 32 31  ALT 36 27 28  ALKPHOS 61 64 64  BILITOT 1.5* 1.4* 1.1  PROT 6.0* 5.8* 5.8*  ALBUMIN 2.8* 2.5* 2.4*   No results for input(s): LIPASE, AMYLASE in the last 168 hours. Recent Labs  Lab 12/26/17 0610  AMMONIA 36*   Coagulation Profile: No results for input(s): INR, PROTIME in the last 168 hours. Cardiac Enzymes: No results for input(s): CKTOTAL, CKMB, CKMBINDEX, TROPONINI in the last 168 hours. BNP (last 3 results) No results for input(s): PROBNP in the last 8760 hours. HbA1C: No results for input(s): HGBA1C in the last 72 hours. CBG: Recent Labs  Lab 12/24/17 0629 12/24/17 1214  GLUCAP 111* 128*   Lipid Profile: No results for  input(s): CHOL, HDL, LDLCALC, TRIG, CHOLHDL, LDLDIRECT in the last 72 hours. Thyroid Function Tests: No results for input(s): TSH, T4TOTAL, FREET4, T3FREE, THYROIDAB in the last 72 hours. Anemia Panel: No results for input(s): VITAMINB12, FOLATE, FERRITIN, TIBC, IRON, RETICCTPCT in the last 72 hours. Sepsis Labs: No results for input(s): PROCALCITON, LATICACIDVEN in the last 168 hours.  Recent Results (from the past 240 hour(s))  Culture, Urine     Status: None   Collection Time: 12/25/17  2:03 PM  Result Value Ref Range Status   Specimen Description URINE, CATHETERIZED  Final   Special Requests NONE  Final   Culture   Final    NO GROWTH Performed at [facility], [address], [place], [place] [address]    Report Status 12/26/2017 FINAL  Final  Culture, blood (routine x 2)     Status: None (Preliminary result)   Collection Time: 12/25/17  2:36 PM  Result Value Ref Range Status   Specimen Description BLOOD LEFT HAND  Final   Special Requests   Final  BOTTLES DRAWN AEROBIC ONLY Blood Culture adequate volume   Culture   Final    NO GROWTH 3 DAYS Performed at [facility], [place] [address] [place], [place] [address]    Report Status PENDING  Incomplete  Culture, blood (routine x 2)     Status: None (Preliminary result)   Collection Time: 12/25/17  2:40 PM  Result Value Ref Range Status   Specimen Description BLOOD RIGHT HAND  Final   Special Requests   Final    BOTTLES DRAWN AEROBIC ONLY Blood Culture adequate volume   Culture   Final    NO GROWTH 3 DAYS Performed at [facility], [address] [place], [place] [address]    Report Status PENDING  Incomplete         Radiology Studies: No results found.      Scheduled Meds: . acetaminophen  650 mg Oral Q4H  . calcium carbonate  1 tablet Oral Q breakfast  . cholecalciferol  1,000 Units Oral Daily  . dexamethasone  4 mg Intravenous Q6H  . docusate sodium  100 mg Oral BID  . feeding  supplement (PRO-STAT SUGAR FREE 64)  30 mL Oral BID  . irbesartan  150 mg Oral Daily  . magnesium oxide  400 mg Oral BID  . mouth rinse  15 mL Mouth Rinse BID  . metoprolol tartrate  25 mg Oral BID  . pantoprazole  40 mg Oral Daily  . polyethylene glycol  17 g Oral Daily  . potassium chloride  30 mEq Oral BID  . tamsulosin  0.4 mg Oral Daily   Continuous Infusions: . sodium chloride 50 mL/hr at 12/29/17 1002     LOS: 6 days        Aline August, MD [facility] Pager [phone]  If 7PM-7AM, please contact night-coverage [email or web address] Password [email or web address] 12/29/2017, 11:00 AM

## 2017-12-30 ENCOUNTER — Inpatient Hospital Stay (HOSPITAL_COMMUNITY): Payer: Medicare Other | Admitting: Speech Pathology

## 2017-12-30 ENCOUNTER — Inpatient Hospital Stay (HOSPITAL_COMMUNITY): Payer: Medicare Other | Admitting: Physical Therapy

## 2017-12-30 ENCOUNTER — Inpatient Hospital Stay (HOSPITAL_COMMUNITY): Payer: Medicare Other | Admitting: *Deleted

## 2017-12-30 ENCOUNTER — Inpatient Hospital Stay (HOSPITAL_COMMUNITY): Payer: Medicare Other | Admitting: Occupational Therapy

## 2017-12-30 DIAGNOSIS — G9341 Metabolic encephalopathy: Secondary | ICD-10-CM

## 2017-12-30 DIAGNOSIS — R0989 Other specified symptoms and signs involving the circulatory and respiratory systems: Secondary | ICD-10-CM

## 2017-12-30 DIAGNOSIS — I10 Essential (primary) hypertension: Secondary | ICD-10-CM

## 2017-12-30 LAB — BASIC METABOLIC PANEL
ANION GAP: 6 (ref 5–15)
BUN: 17 mg/dL (ref 8–23)
CHLORIDE: 100 mmol/L (ref 98–111)
CO2: 25 mmol/L (ref 22–32)
Calcium: 9 mg/dL (ref 8.9–10.3)
Creatinine, Ser: 0.68 mg/dL (ref 0.44–1.00)
Glucose, Bld: 150 mg/dL — ABNORMAL HIGH (ref 70–99)
POTASSIUM: 3.7 mmol/L (ref 3.5–5.1)
SODIUM: 131 mmol/L — AB (ref 135–145)

## 2017-12-30 LAB — CBC WITH DIFFERENTIAL/PLATELET
ABS IMMATURE GRANULOCYTES: 0.42 10*3/uL — AB (ref 0.00–0.07)
BASOS ABS: 0 10*3/uL (ref 0.0–0.1)
BASOS PCT: 0 %
Eosinophils Absolute: 0 10*3/uL (ref 0.0–0.5)
Eosinophils Relative: 0 %
HCT: 35.9 % — ABNORMAL LOW (ref 36.0–46.0)
Hemoglobin: 11.9 g/dL — ABNORMAL LOW (ref 12.0–15.0)
Immature Granulocytes: 3 %
Lymphocytes Relative: 7 %
Lymphs Abs: 1 10*3/uL (ref 0.7–4.0)
MCH: 29.4 pg (ref 26.0–34.0)
MCHC: 33.1 g/dL (ref 30.0–36.0)
MCV: 88.6 fL (ref 80.0–100.0)
MONO ABS: 0.9 10*3/uL (ref 0.1–1.0)
Monocytes Relative: 6 %
NEUTROS ABS: 12.5 10*3/uL — AB (ref 1.7–7.7)
NEUTROS PCT: 84 %
NRBC: 0 % (ref 0.0–0.2)
PLATELETS: 311 10*3/uL (ref 150–400)
RBC: 4.05 MIL/uL (ref 3.87–5.11)
RDW: 13.1 % (ref 11.5–15.5)
WBC: 14.8 10*3/uL — AB (ref 4.0–10.5)

## 2017-12-30 LAB — CULTURE, BLOOD (ROUTINE X 2)
Culture: NO GROWTH
Culture: NO GROWTH
Special Requests: ADEQUATE
Special Requests: ADEQUATE

## 2017-12-30 LAB — MAGNESIUM: Magnesium: 1.7 mg/dL (ref 1.7–2.4)

## 2017-12-30 MED ORDER — IRBESARTAN 300 MG PO TABS
150.0000 mg | ORAL_TABLET | Freq: Once | ORAL | Status: AC
  Administered 2017-12-30: 150 mg via ORAL
  Filled 2017-12-30: qty 1

## 2017-12-30 MED ORDER — IRBESARTAN 300 MG PO TABS
300.0000 mg | ORAL_TABLET | Freq: Every day | ORAL | Status: DC
  Administered 2017-12-31 – 2018-01-17 (×17): 300 mg via ORAL
  Filled 2017-12-30 (×17): qty 1

## 2017-12-30 NOTE — Progress Notes (Signed)
Calorie Count Note  48-hour calorie count ordered and initiated 11/12 at dinner meal. Calorie Count envelope placed on pt's door. Discussed with RN and PA. See full assessment note for details.  Diet: Dysphagia 2, thin liquids Supplements: Pro-stat 30 ml BID, each supplement provides 100 kcal and 15 grams of protein  12/29/17: Note: this information was obtained via conversation with RN who covered pt yesterday as no meal slips were available.  Dinner: 188 kcal, 8 grams of protein (50% of meatloaf, 25% of green beans, 0% of mashed potatoes and gravy, 100% of ginger ale)  Supplements: 1 Pro-stat (100 kcal, 15 grams of protein)   12/30/17: Breakfast: 240 kcal, 5 grams of protein (100% of oatmeal, 100% of cranberry juice, 100% of butter packet, 100% of sugar packet, 100% of black coffee)  Lunch: 150 kcal, 5 grams of protein (estimated given pt ate 1/2 cup of [place] stew that was provided by family, 0% of items from meal tray)  Supplements: 1 Pro-stat (100 kcal, 15 grams of protein)   Total 24-hour intake: 778 kcal (52% of minimum estimated needs)  48 protein (56% of minimum estimated needs)   Nutrition Diagnosis: Moderate Malnutrition related to chronic illness (dementia) as evidenced by mild fat depletion, moderate fat depletion, mild muscle depletion, moderate muscle depletion.   Goal: Patient will meet greater than or equal to 90% of their needs   Intervention: - Prostat 30 ml BID, each supplement provides 100 kcals and 15 grams protein  - Soup BID with lunch and dinner meals  - Pears BID with meals   Gaynell Face, MS, RD, LDN Inpatient Clinical Dietitian Pager: [phone] Weekend/After Hours: [phone]

## 2017-12-30 NOTE — Progress Notes (Signed)
[facility] PHYSICAL MEDICINE & REHABILITATION PROGRESS NOTE  Subjective/Complaints: Patient seen sitting up in bed this AM.  She slept well overnight per report.  She is more alert and oriented this AM.   ROS: Appears to deny CP, SOB, N/V/D.  Objective: Vital Signs: Blood pressure (!) 175/83, pulse 70, temperature 98.6 F (37 C), resp. rate 16, height 5\' 5"  (1.651 m), weight 84.1 kg, SpO2 100 %. Ct Head Wo Contrast  Result Date: 12/29/2017 CLINICAL DATA:  Altered level of consciousness, diffuse headache, known intracranial hemorrhage EXAM: CT HEAD WITHOUT CONTRAST TECHNIQUE: Contiguous axial images were obtained from the base of the skull through the vertex without intravenous contrast. FINDINGS: Brain: Hemorrhage in the right temporoparietal region with surrounding edema is again noted. This hemorrhage does extend into the right lateral ventricle. At the same level as measured previously this blood within the right lateral ventricle measures 2.1 cm in diameter compared to the prior measurement of 2.4 cm. The midline septum is deviated slightly to the left of midline by 4 mm. A small amount of blood is again noted layering within the posterior horns of the lateral ventricles. The ventricles remain somewhat prominent as are the cortical sulci indicative of diffuse atrophy. Moderate small vessel ischemic change also is noted within the periventricular white matter. Vascular: No vascular abnormality is noted on this unenhanced study. Skull: On bone window images, no calvarial abnormality is seen other than diffuse osteopenia. Sinuses/Orbits: The paranasal sinuses appear well pneumatized. Other: None. IMPRESSION: 1. Perhaps slight decrease in size of the hemorrhage within the right temporal region with surrounding edema extending into the right lateral ventricle. The ventricular system remains dilated as are the cortical sulci consistent with atrophy with moderate small vessel ischemic change noted as  well. 2. Shift of the media on septum to the left by 4 mm. Electronically Signed   By: Ivar Drape M.D.   On: 12/29/2017 13:59   Recent Labs    12/28/17 0607 12/30/17 0503  WBC 11.7* 14.8*  HGB 10.8* 11.9*  HCT 33.3* 35.9*  PLT 237 311   Recent Labs    12/28/17 0607 12/30/17 0503  NA 130* 131*  K 3.5 3.7  CL 99 100  CO2 23 25  GLUCOSE 120* 150*  BUN 11 17  CREATININE 0.65 0.68  CALCIUM 8.5* 9.0    Physical Exam: BP (!) 175/83 (BP Location: Right Arm)   Pulse 70   Temp 98.6 F (37 C)   Resp 16   Ht 5\' 5"  (1.651 m)   Wt 84.1 kg   SpO2 100%   BMI 30.85 kg/m  Constitutional: No distress . Vital signs reviewed. HENT: Normocephalic.  Atraumatic. Eyes: EOMI. No discharge. Cardiovascular: Irregularly irregular. No JVD. Respiratory: CTA bilaterally. Normal effort. GI: BS +. Non-distended. Musc: No edema or tenderness in extremities. Musculoskeletal: Generalized edema Neurological:  Alert and oriented x3, expected date of month Right gaze preference Dysarthria Motor: Limited due to participation Previously:  RUE: 4/5 proximal to distal   RLE: 3/5 proximal to distal (?effort)   LUE: 4/5 proximal to distal (?effort)   LLE: 2-/5 proximal to distal   (? effort) Skin: Skin is warm and dry.  Scattered ecchymotic areas.   Psychiatric: Unable to assess due to mentation   Assessment/Plan: 1. Functional deficits secondary to right intraventricular hemorrhage which require 3+ hours per day of interdisciplinary therapy in a comprehensive inpatient rehab setting.  Physiatrist is providing close team supervision and 24 hour management of active medical  problems listed below.  Physiatrist and rehab team continue to assess barriers to discharge/monitor patient progress toward functional and medical goals  Care Tool:  Bathing    Body parts bathed by patient: Right arm, Left arm, Abdomen, Right upper leg, Left upper leg, Face, Chest   Body parts bathed by helper: Right lower  leg, Left lower leg, Buttocks, Front perineal area     Bathing assist Assist Level: Maximal Assistance - Patient 24 - 49%     Upper Body Dressing/Undressing Upper body dressing   What is the patient wearing?: Pull over shirt    Upper body assist Assist Level: Maximal Assistance - Patient 25 - 49%    Lower Body Dressing/Undressing Lower body dressing      What is the patient wearing?: Pants     Lower body assist Assist for lower body dressing: 2 Helpers     Toileting Toileting    Toileting assist Assist for toileting: 2 Helpers     Transfers Chair/bed transfer  Transfers assist  Chair/bed transfer activity did not occur: Safety/medical concerns  Chair/bed transfer assist level: 2 Helpers     Locomotion Ambulation   Ambulation assist   Ambulation activity did not occur: Safety/medical concerns          Walk 10 feet activity   Assist  Walk 10 feet activity did not occur: Safety/medical concerns        Walk 50 feet activity   Assist Walk 50 feet with 2 turns activity did not occur: Safety/medical concerns         Walk 150 feet activity   Assist Walk 150 feet activity did not occur: Safety/medical concerns         Walk 10 feet on uneven surface  activity   Assist Walk 10 feet on uneven surfaces activity did not occur: Safety/medical Armed forces technical officer activity did not occur: Safety/medical concerns         Wheelchair 50 feet with 2 turns activity    Assist    Wheelchair 50 feet with 2 turns activity did not occur: Safety/medical concerns       Wheelchair 150 feet activity     Assist Wheelchair 150 feet activity did not occur: Safety/medical concerns          Medical Problem List and Plan: 1.  Visual field deficits with gaze preference, visual hallucinations, difficulty keeping eyes open,  pusher tendencies and left bias with standing secondary to large  intraventricular hemorrhage   Cont CIR  Repeat head CT reviewed, with increase in edema, discussed with neurosurgery previously, no changes in management. Repeat head CT reviewed, stable.  Per Neurosurg, no intervention planned unless worsening CT.  Please see PA note.   Appreciate IM recs  IV steroids started on 11/11, increased on 11/12  Appreciate palliative care consult 2.  DVT Prophylaxis/Anticoagulation: Mechanical: Sequential compression devices, below knee Bilateral lower extremities 3. Headaches/Pain Management: Limit sedating medications. Limit hydrocodone as needed for severe HA.   Topamax DC'd 4. Mood: LCSW to follow for evaluation and support when appropriate. Resume home ativan prn at lower dose.  5. Neuropsych: This patient is not capable of making decisions on her own behalf 6. Skin/Wound Care: Routine pressure relief measures 7. Fluids/Electrolytes/Nutrition: Monitor I's and O's.    D2 thins, advance diet as tolerated  Intake improving, d/ced IVF on 11/13 8.  E. coli UTI: Completed 7  days of ctx/amoxicillin  Foley DC'd on 11/11 9.  HTN: Blood pressure goals less than 160/90.  Continue metoprolol twice daily and increased Avapro to 300 mg on 11/13.  Labile on 11/13, likely exacerbated by steroids 10.  Seizure prophylaxis: Continue Keppra twice daily, DC'd on 11/12. 11.  Urinary retention: Discontinued Ditropan.  Continue Flomax routine pressure relief measures. 12.  Leukocytosis:   WBCs 14.3 on 11/13, confounded by steroids  ABX per IM DC'd on 11/11  Continue to monitor 13. Hyponatremia:   Sodium 131 and 11/13  IVF DC'd by IM, restarted on 11/12, d/ced on 11/13  Continue to monitor 14. H/o Barrett's esophagus/GERD: On Protonix 15.  Hypokalemia  Potassium 3.7 on 11/13  Supplement increased on 11/1 16.  Hypoalbuminemia  Supplement initiated on 11/7 17.  Acute blood loss anemia  Hemoglobin 10.8 on 11/11 18.  Fevers and lethargy: Fevers resolved, lethargy persists -  improvin  Appreciate IM recs  Antibiotics DC'd 19.  Hypomagnesemia  Magnesium 1.7 on 11/13  Continue supplementation  20. Steroid induced hyperglycemia   Cont to monitor  LOS: 7 days A FACE TO FACE EVALUATION WAS PERFORMED  [name] [name] 12/30/2017, 11:36 AM

## 2017-12-30 NOTE — Progress Notes (Addendum)
Patient ID: [name], female   DOB: [birth year], 83 y.o.   MRN: [id number]  PROGRESS NOTE    AMIAH FROHLICH  NKN:[id number] DOB: [birth year] DOA: 12/23/2017 PCP: Merrilee Seashore, MD   Brief Narrative:  84 y.o. female with history of dementia, chronic atrial fibrillation on Xarelto until recently, pulmonary hypertension, Barrett's esophagus who was recently admitted on 12/18/2017 patient for a large intraventricular hemorrhage which was treated conservatively along with E. coli UTI which was initially treated with Rocephin and switched to Keflex and discharged to inpatient rehab on 12/23/2017.  Hospitalist service was consulted on 12/25/2017 for fever and increased lethargy.  Patient was started on broad-spectrum antibiotics on 12/25/2017.  Antibiotics were de-escalated and stopped on 11/11.  Keppra and Topamax was also discontinued.  CT scan showed more or less stable findings with perhaps mild midline shift.  Patient was started on dexamethasone.   Assessment & Plan:   Active Problems:   Intraventricular hemorrhage (HCC)   ICH (intracerebral hemorrhage) (HCC)   Acute blood loss anemia   Hypoalbuminemia due to protein-calorie malnutrition (HCC)   Hypomagnesemia   Dysphagia, post-stroke   Lethargy   Hypertensive crisis   Cerebral edema (HCC)   Benign essential HTN   Labile blood pressure   Fever with lethargy Etiology for her fever is not entirely clear.  No infectious source has been found.  UA was unremarkable.  Chest x-ray did not show any infiltrates.  Cultures without any growth.  Patient was initially started on vancomycin and Zosyn.  Both of these have been discontinued.  Patient was recently treated with ceftriaxone for E. coli UTI.  Urine culture subsequently have been without any growth.  No further episodes of fever. -CT of the head on 12/25/2017 showed no new bleeding but increased vasogenic edema in the right temporal lobe.  This has been discussed by rehab MD with  neurosurgery.  No plans for surgical intervention.  Patient was started on dexamethasone.  She was also taken off of Topamax and Keppra as these medications can cause lethargy and drowsiness. Patient's mentation has significantly improved.  We will hold off on EEG as her mentation has improved.    Leukocytosis -Mild. Elevated WBC is most likely due to steroids.  Recent E. coli UTI -Status post completion of antibiotic treatment.  E. coli was pansensitive.  Urine culture from 11/8 without any growth.  Recent intraventricular hemorrhage -Conservatively treated.    See discussion above.  Patient started on dexamethasone due to presence of midline shift.  Essential hypertension Blood pressure was noted to be elevated.  Dose of metoprolol was increased.  Continue to monitor for now.  May need additional dose adjustment.  She is also on irbesartan.  Chronic atrial fibrillation -Rate controlled.  Continue metoprolol.  Off Xarelto since recent intraventricular hemorrhage  Hypokalemia -Replaced.  Improved  Hyponatremia -Stable.  IV fluids has been restarted by primary team because of poor oral intake.  Sodium level is stable.  Generalized deconditioning -Patient had remained lethargic for so many days now with not much improvement. Prognosis is thought to be poor.  Seen by palliative medicine.  Appreciate the involvement.  Mentation has improved in the interim.   Antimicrobials: Vancomycin 12/25/2017-12/27/2017 Zosyn from 12/25/2017-12/28/2017 Patient was on Rocephin from 12/18/2017-12/23/2017 and subsequently on Keflex from 12/23/2017-12/24/2017  Subjective: Patient awake alert.  Denies any shortness of breath.  Does complain of headache in the frontal part of her head though it is better than what it was a few days  ago.  Occasional nausea.  Objective: Vitals:   12/29/17 1450 12/29/17 2005 12/30/17 1323 12/30/17 1417  BP: (!) 149/81 (!) 175/83 (!) 135/97 (!) 173/94  Pulse: 66 70 71 67    Resp: 18 16    Temp:  98.6 F (37 C)    TempSrc:      SpO2: 99% 100%  100%  Weight:      Height:        Intake/Output Summary (Last 24 hours) at 12/30/2017 1428 Last data filed at 12/30/2017 1002 Gross per 24 hour  Intake 384.25 ml  Output 1581 ml  Net -1196.75 ml   Filed Weights   12/23/17 1624  Weight: 84.1 kg    Examination:  General exam: She is awake alert.  Mildly distracted. Respiratory system: Normal effort at rest.  Clear to auscultation bilaterally.  No wheezing rales or rhonchi. Cardiovascular system: S1-S2 is normal regular.  No S3-S4.  No rubs murmurs or bruit Gastrointestinal system: Abdomen is soft.  Nontender nondistended   Data Reviewed: I have personally reviewed following labs and imaging studies  CBC: Recent Labs  Lab 12/24/17 0548 12/25/17 0458 12/26/17 0610 12/26/17 0844 12/27/17 0620 12/28/17 0607 12/30/17 0503  WBC 10.5 12.7*  --  12.8* 10.5 11.7* 14.8*  NEUTROABS 7.3 9.1*  --   --  6.6  --  12.5*  HGB 10.4* 10.9*  --  10.4* 10.5* 10.8* 11.9*  HCT 30.7* 33.8* 29.2* 30.4* 33.2* 33.3* 35.9*  MCV 88.2 88.5  --  87.1 89.7 88.3 88.6  PLT 177 230  --  203 154 237 009   Basic Metabolic Panel: Recent Labs  Lab 12/24/17 1227  12/25/17 0458 12/26/17 0610 12/27/17 0620 12/28/17 0607 12/30/17 0503  NA  --    < > 132* 129* 133* 130* 131*  K  --    < > 3.5 2.9* 3.7 3.5 3.7  CL  --    < > 95* 97* 103 99 100  CO2  --    < > 28 23 23 23 25   GLUCOSE  --    < > 98 158* 132* 120* 150*  BUN  --    < > 12 16 13 11 17   CREATININE  --    < > 0.65 0.84 0.81 0.65 0.68  CALCIUM  --    < > 8.8* 8.4* 8.3* 8.5* 9.0  MG 1.5*  --   --   --  1.5* 1.4* 1.7   < > = values in this interval not displayed.   GFR: Estimated Creatinine Clearance: 51 mL/min (by C-G formula based on SCr of 0.68 mg/dL). Liver Function Tests: Recent Labs  Lab 12/24/17 0548 12/26/17 0610 12/27/17 0620  AST 35 32 31  ALT 36 27 28  ALKPHOS 61 64 64  BILITOT 1.5* 1.4* 1.1   PROT 6.0* 5.8* 5.8*  ALBUMIN 2.8* 2.5* 2.4*    Recent Labs  Lab 12/26/17 0610  AMMONIA 36*   CBG: Recent Labs  Lab 12/24/17 0629 12/24/17 1214  GLUCAP 111* 128*     Recent Results (from the past 240 hour(s))  Culture, Urine     Status: None   Collection Time: 12/25/17  2:03 PM  Result Value Ref Range Status   Specimen Description URINE, CATHETERIZED  Final   Special Requests NONE  Final   Culture   Final    NO GROWTH Performed at [facility], [place] [address], [place], [place] [address]    Report Status  12/26/2017 FINAL  Final  Culture, blood (routine x 2)     Status: None   Collection Time: 12/25/17  2:36 PM  Result Value Ref Range Status   Specimen Description BLOOD LEFT HAND  Final   Special Requests   Final    BOTTLES DRAWN AEROBIC ONLY Blood Culture adequate volume   Culture   Final    NO GROWTH 5 DAYS Performed at [facility], [place] [address] [place], [place] [address]    Report Status 12/30/2017 FINAL  Final  Culture, blood (routine x 2)     Status: None   Collection Time: 12/25/17  2:40 PM  Result Value Ref Range Status   Specimen Description BLOOD RIGHT HAND  Final   Special Requests   Final    BOTTLES DRAWN AEROBIC ONLY Blood Culture adequate volume   Culture   Final    NO GROWTH 5 DAYS Performed at [facility], [place] [address], [place],  [address]    Report Status 12/30/2017 FINAL  Final         Radiology Studies: Ct Head Wo Contrast  Result Date: 12/29/2017 CLINICAL DATA:  Altered level of consciousness, diffuse headache, known intracranial hemorrhage EXAM: CT HEAD WITHOUT CONTRAST TECHNIQUE: Contiguous axial images were obtained from the base of the skull through the vertex without intravenous contrast. FINDINGS: Brain: Hemorrhage in the right temporoparietal region with surrounding edema is again noted. This hemorrhage does extend into the right lateral ventricle. At the same level as measured previously this blood  within the right lateral ventricle measures 2.1 cm in diameter compared to the prior measurement of 2.4 cm. The midline septum is deviated slightly to the left of midline by 4 mm. A small amount of blood is again noted layering within the posterior horns of the lateral ventricles. The ventricles remain somewhat prominent as are the cortical sulci indicative of diffuse atrophy. Moderate small vessel ischemic change also is noted within the periventricular white matter. Vascular: No vascular abnormality is noted on this unenhanced study. Skull: On bone window images, no calvarial abnormality is seen other than diffuse osteopenia. Sinuses/Orbits: The paranasal sinuses appear well pneumatized. Other: None. IMPRESSION: 1. Perhaps slight decrease in size of the hemorrhage within the right temporal region with surrounding edema extending into the right lateral ventricle. The ventricular system remains dilated as are the cortical sulci consistent with atrophy with moderate small vessel ischemic change noted as well. 2. Shift of the media on septum to the left by 4 mm. Electronically Signed   By: Ivar Drape M.D.   On: 12/29/2017 13:59        Scheduled Meds: . acetaminophen  650 mg Oral Q4H  . calcium carbonate  1 tablet Oral Q breakfast  . cholecalciferol  1,000 Units Oral Daily  . dexamethasone  4 mg Intravenous Q6H  . docusate sodium  100 mg Oral BID  . feeding supplement (PRO-STAT SUGAR FREE 64)  30 mL Oral BID  . [START ON 12/31/2017] irbesartan  300 mg Oral Daily  . magnesium oxide  400 mg Oral BID  . mouth rinse  15 mL Mouth Rinse BID  . metoprolol tartrate  50 mg Oral BID  . pantoprazole (PROTONIX) IV  40 mg Intravenous Q0600  . polyethylene glycol  17 g Oral Daily  . potassium chloride  30 mEq Oral BID  . ranitidine  150 mg Oral BID  . tamsulosin  0.4 mg Oral Daily   Continuous Infusions:  LOS: 7 days      Bonnielee Haff, MD [facility] Pager [phone]  If 7PM-7AM,  please contact night-coverage [email or web address] Password [email or web address] 12/30/2017, 2:28 PM

## 2017-12-30 NOTE — Progress Notes (Signed)
Speech Language Pathology Daily Session Note  Patient Details  Name: [name] MRN: [id number] Date of Birth: 05/21/1926  Today's Date: 12/30/2017 SLP Individual Time: 1320-1400; 1500-1520 SLP Individual Time Calculation (min): 40 min; 20 min  Short Term Goals: Week 1: SLP Short Term Goal 1 (Week 1): Pt will consume dys 2 textures and thin liquids with min cues for use of swallowing precautions and minimal overt s/s of aspiration.  SLP Short Term Goal 2 (Week 1): Pt will sustain her attention to basic, functional tasks for 5 minutes with mod cues for redirection.   SLP Short Term Goal 3 (Week 1): Pt will complete basic, familiar tasks with mod assist for functional problem solving.   SLP Short Term Goal 4 (Week 1): Pt will locate items to the left of midline in ~50% of opportunities during basic, familiar tasks with mod assist.    Skilled Therapeutic Interventions:  Session 1:  Pt was seen for skilled ST targeting cognitive goals. Session began late due to meeting with palliative care.  Upon arrival, pt was awake, alert, and able to recall visit with daughter but not visit with palliative care.   Pt could appropriately engage in conversations with therapist regarding her daughter taking care of her son in law and even remembered at least 2 activities from therapy sessions.  Pt needed max to total cues to turn her head to locate items slightly to the left of midline and was unable to completely scan to find me when I was seated directly to her left.  Pt requested to go to the bathroom towards the end of today's therapy session and was transferred back to bed via maximove with +2 assist from nursing.  Pt had already been incontinent of stool once returned to bed.  Pt left in bed with nursing at bedside to clean pt and cath.  Continue per current plan of care.    Session 2:  Returned to room later this afternoon to see pt for make up time of earlier missed minutes.  Therapist facilitated the  session with a trial of dys 3 textures to continue working towards diet progression.  Pt consumed advanced consistencies with supervision cues for use of swallowing precautions.  Mastication was more timely in comparison to yesterday's trials due to improved mentation.  Pt was distractible and perseverative on getting food from her refrigerator at home and needed max cues to recall that she was at the hospital and not at home.  Pt was left in bed with bed alarm set and call bell within reach.  Continue per current plan of care.    Pain Pain Assessment Pain Scale: 0-10 Pain Score: 0-No pain  Therapy/Group: Individual Therapy  Lynton Crescenzo, Selinda Orion 12/30/2017, 3:27 PM

## 2017-12-30 NOTE — Progress Notes (Signed)
Daily Progress Note   Patient Name: [name]       Date: 12/30/2017 DOB: [birth year]  Age: 84 y.o. MRN#: [id number] Attending Physician: Jamse Arn, MD Primary Care Physician: Merrilee Seashore, MD Admit Date: 12/23/2017  Reason for Consultation/Follow-up: Establishing goals of care  Subjective: Patient is sitting in wheelchair with daughter at bedside. They are discussing SNF with the SW. [name] states she is widowed and has 1 child. She jokes during conversation. She lived in [place] for years and worked Engineer, maintenance (IT). She moved to [place] to be near her daughter.  She states prior to this hospitalization, she felt good and was independent except for driving. She used a cane. She states since being here she is able to independently do some things for herself, but needs help with others.  Her daughter states she is much better than yesterday, and feels this is the best day she has had. Decadron was initiated yesterday for intracranial edema.     We discussed her diagnosis, prognosis, GOC, EOL wishes disposition and options.  A detailed discussion was had today regarding advanced directives.  Concepts specific to code status, artifical feeding and hydration, IV antibiotics and rehospitalization were discussed.  The difference between an aggressive medical intervention path and a comfort care path was discussed.  Values and goals of care important to patient and family were attempted to be elicited.  She states she would never want a feeding tube. She would never want dialysis. She would like to continue to treat things that are treatable, and would want to return to the hospital if needed. She would never want a breathing tube for cardio/pulmonary  arrest or distress/failure, chest compressions, or shocks with CPR. She states "just let me go naturally."  During our visit she ate a mug of brunswick stew. No coughing or throat clearing.  It was noticed at times during the conversation Ms. Duhamel was confused. She and her daughter (next of kin) agreed with the above GOC.    At this time based on her goals, she is appropriate for having outpatient palliative to follow.      Attempted to complete a MOST form, however when I returned a few moments after going to obtain a form, the daughter had left to go upstairs to her husband's  bedside. MOST form left at bedside.   Length of Stay: 7  Current Medications: Scheduled Meds:  . acetaminophen  650 mg Oral Q4H  . calcium carbonate  1 tablet Oral Q breakfast  . cholecalciferol  1,000 Units Oral Daily  . dexamethasone  4 mg Intravenous Q6H  . docusate sodium  100 mg Oral BID  . feeding supplement (PRO-STAT SUGAR FREE 64)  30 mL Oral BID  . [START ON 12/31/2017] irbesartan  300 mg Oral Daily  . magnesium oxide  400 mg Oral BID  . mouth rinse  15 mL Mouth Rinse BID  . metoprolol tartrate  50 mg Oral BID  . pantoprazole (PROTONIX) IV  40 mg Intravenous Q0600  . polyethylene glycol  17 g Oral Daily  . potassium chloride  30 mEq Oral BID  . ranitidine  150 mg Oral BID  . tamsulosin  0.4 mg Oral Daily    Continuous Infusions:   PRN Meds: acetaminophen, alum & mag hydroxide-simeth, bisacodyl, cloNIDine, guaiFENesin-dextromethorphan, lidocaine, nitroGLYCERIN, ondansetron (ZOFRAN) IV, polyethylene glycol, prochlorperazine **OR** prochlorperazine **OR** prochlorperazine, simethicone, sodium phosphate  Physical Exam  Constitutional: No distress.  Pulmonary/Chest: Effort normal.  Neurological: She is alert.  Skin: Skin is warm and dry.            Vital Signs: BP (!) 135/97   Pulse 71   Temp 98.6 F (37 C)   Resp 16   Ht 5\' 5"  (1.651 m)   Wt 84.1 kg   SpO2 100%   BMI 30.85 kg/m    SpO2: SpO2: 100 % O2 Device: O2 Device: Room Air O2 Flow Rate:    Intake/output summary:   Intake/Output Summary (Last 24 hours) at 12/30/2017 1337 Last data filed at 12/30/2017 1002 Gross per 24 hour  Intake 384.25 ml  Output 1581 ml  Net -1196.75 ml   LBM: Last BM Date: 12/30/17 Baseline Weight: Weight: 84.1 kg Most recent weight: Weight: 84.1 kg       Palliative Assessment/Data: 40%    Flowsheet Rows     Most Recent Value  Intake Tab  Referral Department  -- [Rehab]  Unit at Time of Referral  Other (Comment)  Palliative Care Primary Diagnosis  Neurology  Date Notified  12/29/17  Palliative Care Type  New Palliative care  Reason for referral  Clarify Goals of Care, Counsel Regarding Hospice  Date of Admission  12/23/17  Date first seen by Palliative Care  12/29/17  # of days Palliative referral response time  0 Day(s)  # of days IP prior to Palliative referral  6  Clinical Assessment  Psychosocial & Spiritual Assessment  Palliative Care Outcomes      Patient Active Problem List   Diagnosis Date Noted  . Benign essential HTN   . Labile blood pressure   . Hypertensive crisis   . Cerebral edema (HCC)   . Hypomagnesemia   . Dysphagia, post-stroke   . Lethargy   . Acute blood loss anemia   . Hypoalbuminemia due to protein-calorie malnutrition ([place])   . ICH (intracerebral hemorrhage) ([place]) 12/23/2017  . History of Barrett's esophagus   . Urinary retention   . Seizure prophylaxis   . E. coli UTI   . Intraventricular hemorrhage ([place])   . Urinary tract infection without hematuria   . Coronary artery disease involving native coronary artery of native heart without angina pectoris   . Pulmonary HTN ([place])   . Leukocytosis   . Intracranial hemorrhage ([place]) 12/18/2017  . Syncope 04/10/2015  .  Sepsis secondary to UTI ([place]) 04/10/2015  . URI (upper respiratory infection) 04/10/2015  . UTI (lower urinary tract infection) 04/10/2015  . QT prolongation 10/12/2014   . Headache 09/02/2012  . Tricuspid valve regurgitation, moderate 08/18/2012  . Hypokalemia 08/16/2012  . Hyponatremia 08/16/2012  . Atrial fibrillation ([place])   . Hypertension   . Anxiety   . Mixed hyperlipidemia 09/11/2009  . GERD 08/30/2008  . CHEST PAIN 08/30/2008  . Coronary atherosclerosis 04/04/2008  . Syncope and collapse 04/04/2008    Palliative Care Assessment & Plan    Recommendations/Plan:  Recommend palliative at D/C.   Will shadow for decline.     Code Status:    Code Status Orders  (From admission, onward)         Start     Ordered   12/23/17 1656  Do not attempt resuscitation (DNR)  Continuous    Question Answer Comment  In the event of cardiac or respiratory ARREST Do not call a "code blue"   In the event of cardiac or respiratory ARREST Do not perform Intubation, CPR, defibrillation or ACLS   In the event of cardiac or respiratory ARREST Use medication by any route, position, wound care, and other measures to relive pain and suffering. May use oxygen, suction and manual treatment of airway obstruction as needed for comfort.      12/23/17 1655        Code Status History    Date Active Date Inactive Code Status Order ID Comments User Context   12/23/2017 1655 12/23/2017 1655 DNR [id number]  Flora Lipps Inpatient   12/18/2017 1800 12/23/2017 1620 DNR [id number]  Traci Sermon, PA-C ED   04/10/2015 2047 04/13/2015 1542 Full Code [id number]  Etta Quill, DO ED   10/12/2014 1806 10/13/2014 1632 Full Code [id number]  Samella Parr, NP Inpatient    Advance Directive Documentation     Most Recent Value  Type of Advance Directive  Healthcare Power of Attorney, Living will  Pre-existing out of facility DNR order (yellow form or pink MOST form)  -  "MOST" Form in Place?  -       Prognosis:   Unable to determine  Discharge Planning:  [facility] for rehab with Palliative care service follow-up  Care plan was discussed  with RN  Thank you for allowing the Palliative Medicine Team to assist in the care of this patient.   Time In: 12:10 Time Out: 1:20 Total Time 70 min Prolonged Time Billed yes       Greater than 50%  of this time was spent counseling and coordinating care related to the above assessment and plan.  Asencion Gowda, NP  Please contact Palliative Medicine Team phone at [phone] for questions and concerns.

## 2017-12-30 NOTE — Progress Notes (Signed)
Physical Therapy Session Note  Patient Details  Name: [name] MRN: [id number] Date of Birth: 09-09-1926  Today's Date: 12/30/2017 PT Individual Time: 7902-4097 PT Individual Time Calculation (min): 38 min   Short Term Goals: Week 1:  PT Short Term Goal 1 (Week 1): Pt will perform bed mobility with max assist of 1 consistently  PT Short Term Goal 2 (Week 1): Pt will sit EOB x 3 min with mod assist  PT Short Term Goal 3 (Week 1): Pt will perform bed<>WC transfers with max assist  PT Short Term Goal 4 (Week 1): Pt will tolerate sitting in WC for up to 2 hours to prepare for out of bed mobility   Skilled Therapeutic Interventions/Progress Updates: Pt presented in bed alert with both eyes open receiving meds. Pt was able to appropriately respond to all questions indicating HA 9/10. Pt performed rolling L/R to set up Maxi Move pad with pt requiring max multimodal cues for reaching for bed rail, requiring maxA x 1. Pt then noted to have episode of bowl incontinence. Pt then performed additional rolling L/R in same manner requiring maxA. Pt then repositioned to comfort due to limited time and left with granddaughter present.      Therapy Documentation Precautions:  Precautions Precautions: Fall Precaution Comments: pushing left, decreased attention, left hemiparesis Restrictions Weight Bearing Restrictions: No      Therapy/Group: Individual Therapy  Atha Muradyan  Cheikh Bramble, PTA  12/30/2017, 4:12 PM

## 2017-12-30 NOTE — Progress Notes (Addendum)
Initial Nutrition Assessment  DOCUMENTATION CODES:   Non-severe (moderate) malnutrition in context of acute illness/injury, Obesity unspecified  INTERVENTION:  - Continue 48-hour Calorie Count  - Continue 30 ml Prostat BID, each supplement provides 100 kcals and 15 grams protein  - Ordered soup BID with lunch and dinner meals  - Ordered pears BID with meals (these need to be cut into small pieces per Dysphagia 2 diet guidelines)  NUTRITION DIAGNOSIS:   Moderate Malnutrition related to chronic illness (dementia) as evidenced by mild fat depletion, moderate fat depletion, mild muscle depletion, moderate muscle depletion.  GOAL:   Patient will meet greater than or equal to 90% of their needs  MONITOR:   PO intake, Weight trends, I & O's, Labs, Skin  REASON FOR ASSESSMENT:   Consult Calorie Count  ASSESSMENT:   84 year old female with PMH significant for dementia, atrial fibrillation, pulmonary hypertension, hyperlipidemia, GERD, and Barrett's esophagus. Pt was admitted on 12/18/2017 with complaints of headache and blurry vision. CT of head showed intraventricular hemorrhage. Per Neurosurgery, no need for surgical intervention.  Since admission to CIR, pt with increased lethargy. Palliative care team following with plan for [place] meeting today. [facility] hospitalists consulted to follow pt on 11/8.  Discussed calorie count and pt with RN. Calorie count enveloped placed on door, and verbal review of PO intake since yesterday dinner meal received from RN. See Calorie Count note to follow. Per RN, pt drinks a lot of water.  Spoke with pt at bedside. Pt in wheelchair watching TV at time of visit.  Pt states that she had a good appetite and ate well PTA. Pt endorses typically eating 3 meals daily.  Breakfast: pears, 1/3 cup of oatmeal, almond milk Lunch: [place] sandwich on hotdog bun with pepper jack cheese Dinner: grilled chicken with vegetable  Pt endorses weight loss of about 10  lbs and that she lost this weight PTA. Pt reports her UBW as 180 lbs. Per weight history in chart, pt with recent weight gain, perhaps related to fluid status. Pt reports that she has weighed 180 lbs most of her adult life.  Spoke with PA regarding ordering soups and pears with meals. Per PA, will not order Ensure Enlive oral nutrition supplements due to lactose intolerance.  Pt states that since admission, she has been following a "lighter diet" as advised by per PCP due to hypertension. RD assured pt that foods provided to her on her trays are appropriate given her current condition. Pt requested no gravy with mashed potatoes as she believes it contributes her her constipation.  Meal Completion: 0-50% x last 8 meals  Medications reviewed and include: calcium carbonate, cholecalciferol, Colace, Pro-stat BID, magnesium oxide, Protonix, Miralax, K-dur  Labs reviewed: sodium 131 (L)  UOP: 800 ml x 24 hours  NUTRITION - FOCUSED PHYSICAL EXAM:    Most Recent Value  Orbital Region  Moderate depletion  Upper Arm Region  No depletion  Thoracic and Lumbar Region  No depletion  Buccal Region  Mild depletion  Temple Region  Moderate depletion  Clavicle Bone Region  Mild depletion  Clavicle and Acromion Bone Region  Moderate depletion  Scapular Bone Region  Mild depletion  Dorsal Hand  No depletion  Patellar Region  No depletion  Anterior Thigh Region  Mild depletion  Posterior Calf Region  Mild depletion  Edema (RD Assessment)  Mild [BUE, BLE]  Hair  Reviewed  Eyes  Reviewed  Mouth  Reviewed  Skin  Reviewed [scattered ecchymosis]  Nails  Reviewed  Diet Order:   Diet Order            DIET DYS 2 Room service appropriate? Yes; Fluid consistency: Thin  Diet effective now              EDUCATION NEEDS:   Education needs have been addressed  Skin:  Skin Assessment: Skin Integrity Issues: Other: left arm skin tear  Last BM:  11/13 (large type 6)  Height:   Ht Readings  from Last 1 Encounters:  12/23/17 5\' 5"  (1.651 m)    Weight:   Wt Readings from Last 1 Encounters:  12/23/17 84.1 kg    Ideal Body Weight:  56.82 kg  BMI:  Body mass index is 30.85 kg/m.  Estimated Nutritional Needs:   Kcal:  1500-1700  Protein:  85-100 grams  Fluid:  >/= 1.5 L    Gaynell Face, MS, RD, LDN Inpatient Clinical Dietitian Pager: [phone] Weekend/After Hours: [phone]

## 2017-12-30 NOTE — Progress Notes (Signed)
Occupational Therapy Session Note  Patient Details  Name: [name] MRN: [id number] Date of Birth: March 26, 1926  Today's Date: 12/30/2017 OT Individual Time: 1002-1102 OT Individual Time Calculation (min): 60 min    Short Term Goals: Week 2:  OT Short Term Goal 1 (Week 2): Pt will maintain sustained attention for 15 mins to ADL task with no more than min instructional cueing to keep her eyes open.  OT Short Term Goal 2 (Week 2): Pt will complete UB bathing with mod instructional cueing and min assist in supported sitting.  OT Short Term Goal 3 (Week 2): Pt will sit EOB with mod assist for static sitting balance for at least 2 mins in preparation for selfcare tasks or transfers. OT Short Term Goal 4 (Week 2): Pt will perform sit to stand with max assist during LB selfcare tasks.  OT Short Term Goal 5 (Week 2): Pt will use the LUE as a gross assist with max hand over hand facilitation for washing the right arm during bathing tasks.   Skilled Therapeutic Interventions/Progress Updates:    Pt completed bathing and dressing during session.  Total assist for supine to sit EOB and for maintaining sitting balance EOB during bathing and dressing.  Increased pushing to the left side with max assist to correct.  Unable to maintain balance statically without assist.  She was able to wash her UB once supported with min assist.  She spontaneously used the LUE for washing, drying, and applying deodorant with min assist.  She demonstrated significant left visual field cut and needed max demonstrational cueing to scan to midline for locating wash pan and washcloth.  If therapist assisted with turning her head to the left, she would still demonstrate gaze to the right, unable to bring her eyes to midline.  Max assist needed for donning pullover shirt with total +2 (pt 25%) for donning pants sit to stand.  Total +2 (pt 25%) for stand pivot to the right to transfer to the tilt in space wheelchair.  Pt finished  session with combing of her hair with min assist for thoroughness.  Pt placed in wheelchair at bedside with call button in lap and safety alarm belt in place.    Therapy Documentation Precautions:  Precautions Precautions: Fall Precaution Comments: pushing left, decreased attention, left hemiparesis Restrictions Weight Bearing Restrictions: No  Pain: Pain Assessment Pain Scale: Faces Pain Score: 0-No pain Faces Pain Scale: Hurts whole lot Pain Type: Acute pain Pain Location: Head Pain Orientation: Anterior Pain Descriptors / Indicators: Headache;Pounding Pain Frequency: Constant Pain Onset: On-going Patients Stated Pain Goal: 3 Pain Intervention(s): Medication (See eMAR);Distraction;Cold applied ADL:  Therapy/Group: Individual Therapy  Darienne Belleau OTR/L 12/30/2017, 11:13 AM

## 2017-12-31 ENCOUNTER — Inpatient Hospital Stay (HOSPITAL_COMMUNITY): Payer: Medicare Other

## 2017-12-31 ENCOUNTER — Inpatient Hospital Stay (HOSPITAL_COMMUNITY): Payer: Medicare Other | Admitting: Speech Pathology

## 2017-12-31 ENCOUNTER — Inpatient Hospital Stay (HOSPITAL_COMMUNITY): Payer: Medicare Other | Admitting: *Deleted

## 2017-12-31 DIAGNOSIS — R739 Hyperglycemia, unspecified: Secondary | ICD-10-CM

## 2017-12-31 DIAGNOSIS — T380X5A Adverse effect of glucocorticoids and synthetic analogues, initial encounter: Secondary | ICD-10-CM

## 2017-12-31 MED ORDER — LABETALOL HCL 100 MG PO TABS: 100.0000 mg | ORAL_TABLET | Freq: Three times a day (TID) | ORAL | Status: DC | PRN

## 2017-12-31 MED ORDER — MODAFINIL 100 MG PO TABS
100.0000 mg | ORAL_TABLET | Freq: Every day | ORAL | Status: DC
  Administered 2017-12-31 – 2018-01-12 (×13): 100 mg via ORAL
  Filled 2017-12-31 (×13): qty 1

## 2017-12-31 MED ORDER — PANTOPRAZOLE SODIUM 40 MG PO TBEC
40.0000 mg | DELAYED_RELEASE_TABLET | Freq: Every day | ORAL | Status: DC
  Administered 2018-01-01 – 2018-01-18 (×17): 40 mg via ORAL
  Filled 2017-12-31 (×17): qty 1

## 2017-12-31 NOTE — Progress Notes (Signed)
Calorie Count Note  48-hour calorie count ordered and initiated 11/12 at dinner meal. Calorie count ended today after lunch meal. Will d/c order at this time as calorie count is complete.  Diet: Dysphagia 2, thin liquids Supplements: Pro-stat 30 ml BID, each supplement provides 100 kcal and 15 grams of protein  12/30/17: Dinner: 0 kcal, 0 grams of protein (no meal slip in envelope, spoke with RN but RN was not here at dinner meal yesterday) Supplements: 1 Pro-stat (100 kcal, 15 grams of protein)  12/31/17: Breakfast: 60 kcal, 1 gram of protein (100% of orange juice, 1 bite of pureed peaches) Lunch: 12 kcal, 2 grams of protein (bites of [place] and broccoli) Supplements: 1 Pro-stat (100 kcal, 15 grams of protein)  Total 24-hour intake: 272 kcal (18% of minimum estimated needs)  33 grams of protein (39% of minimum estimated needs)   Nutrition Diagnosis: Moderate Malnutritionrelated to chronic illness (dementia)as evidenced by mild fat depletion, moderate fat depletion, mild muscle depletion, moderate muscle depletion.   Goal: Patient will meet greater than or equal to 90% of their needs   Intervention: - Prostat 30 ml BID, each supplement provides 100 kcals and 15 grams protein  - Soup BID with lunch and dinner meals  - Pears BID with meals   Gaynell Face, MS, RD, LDN Inpatient Clinical Dietitian Pager: [phone] Weekend/After Hours: [phone]

## 2017-12-31 NOTE — Progress Notes (Signed)
[facility] PHYSICAL MEDICINE & REHABILITATION PROGRESS NOTE  Subjective/Complaints: Patient seen laying in bed this morning.  Therapy is actually improved with her.  She is lethargic, but appears relatively appropriate when awake.  ROS: Appears to deny CP, SOB, N/V/D.  Objective: Vital Signs: Blood pressure (!) 174/98, pulse 72, temperature 98.9 F (37.2 C), temperature source Oral, resp. rate 17, height 5\' 5"  (1.651 m), weight 84.1 kg, SpO2 98 %. Ct Head Wo Contrast  Result Date: 12/29/2017 CLINICAL DATA:  Altered level of consciousness, diffuse headache, known intracranial hemorrhage EXAM: CT HEAD WITHOUT CONTRAST TECHNIQUE: Contiguous axial images were obtained from the base of the skull through the vertex without intravenous contrast. FINDINGS: Brain: Hemorrhage in the right temporoparietal region with surrounding edema is again noted. This hemorrhage does extend into the right lateral ventricle. At the same level as measured previously this blood within the right lateral ventricle measures 2.1 cm in diameter compared to the prior measurement of 2.4 cm. The midline septum is deviated slightly to the left of midline by 4 mm. A small amount of blood is again noted layering within the posterior horns of the lateral ventricles. The ventricles remain somewhat prominent as are the cortical sulci indicative of diffuse atrophy. Moderate small vessel ischemic change also is noted within the periventricular white matter. Vascular: No vascular abnormality is noted on this unenhanced study. Skull: On bone window images, no calvarial abnormality is seen other than diffuse osteopenia. Sinuses/Orbits: The paranasal sinuses appear well pneumatized. Other: None. IMPRESSION: 1. Perhaps slight decrease in size of the hemorrhage within the right temporal region with surrounding edema extending into the right lateral ventricle. The ventricular system remains dilated as are the cortical sulci consistent with atrophy  with moderate small vessel ischemic change noted as well. 2. Shift of the media on septum to the left by 4 mm. Electronically Signed   By: Ivar Drape M.D.   On: 12/29/2017 13:59   Recent Labs    12/30/17 0503  WBC 14.8*  HGB 11.9*  HCT 35.9*  PLT 311   Recent Labs    12/30/17 0503  NA 131*  K 3.7  CL 100  CO2 25  GLUCOSE 150*  BUN 17  CREATININE 0.68  CALCIUM 9.0    Physical Exam: BP (!) 174/98 (BP Location: Right Arm)   Pulse 72   Temp 98.9 F (37.2 C) (Oral)   Resp 17   Ht 5\' 5"  (1.651 m)   Wt 84.1 kg   SpO2 98%   BMI 30.85 kg/m  Constitutional: No distress . Vital signs reviewed. HENT: Normocephalic.  Atraumatic. Eyes: EOMI. No discharge. Cardiovascular: Irregularly irregular.  No JVD. Respiratory: CTA bilaterally.  Normal effort. GI: BS +. Non-distended. Musc: No edema or tenderness in extremities. Musculoskeletal: Generalized edema Neurological:  Lethargic Right gaze preference Mild dysarthria Motor: Limited due to participation Previously:  RUE: 4/5 proximal to distal   RLE: 3/5 proximal to distal (?effort)   LUE: 4/5 proximal to distal (?effort)   LLE: 2-/5 proximal to distal   (? effort) Skin: Skin is warm and dry.  Scattered ecchymotic areas.   Psychiatric: Unable to assess due to mentation   Assessment/Plan: 1. Functional deficits secondary to right intraventricular hemorrhage which require 3+ hours per day of interdisciplinary therapy in a comprehensive inpatient rehab setting.  Physiatrist is providing close team supervision and 24 hour management of active medical problems listed below.  Physiatrist and rehab team continue to assess barriers to discharge/monitor patient progress toward  functional and medical goals  Care Tool:  Bathing    Body parts bathed by patient: Right arm, Left arm, Abdomen, Right upper leg, Left upper leg, Face, Chest   Body parts bathed by helper: Right lower leg, Left lower leg, Buttocks, Front perineal area      Bathing assist Assist Level: Maximal Assistance - Patient 24 - 49%     Upper Body Dressing/Undressing Upper body dressing   What is the patient wearing?: Pull over shirt    Upper body assist Assist Level: Maximal Assistance - Patient 25 - 49%    Lower Body Dressing/Undressing Lower body dressing      What is the patient wearing?: Pants     Lower body assist Assist for lower body dressing: 2 Helpers     Toileting Toileting    Toileting assist Assist for toileting: 2 Helpers     Transfers Chair/bed transfer  Transfers assist  Chair/bed transfer activity did not occur: Safety/medical concerns  Chair/bed transfer assist level: 2 Helpers     Locomotion Ambulation   Ambulation assist   Ambulation activity did not occur: Safety/medical concerns          Walk 10 feet activity   Assist  Walk 10 feet activity did not occur: Safety/medical concerns        Walk 50 feet activity   Assist Walk 50 feet with 2 turns activity did not occur: Safety/medical concerns         Walk 150 feet activity   Assist Walk 150 feet activity did not occur: Safety/medical concerns         Walk 10 feet on uneven surface  activity   Assist Walk 10 feet on uneven surfaces activity did not occur: Safety/medical Armed forces technical officer activity did not occur: Safety/medical concerns         Wheelchair 50 feet with 2 turns activity    Assist    Wheelchair 50 feet with 2 turns activity did not occur: Safety/medical concerns       Wheelchair 150 feet activity     Assist Wheelchair 150 feet activity did not occur: Safety/medical concerns          Medical Problem List and Plan: 1.  Visual field deficits with gaze preference, visual hallucinations, difficulty keeping eyes open,  pusher tendencies and left bias with standing secondary to large intraventricular hemorrhage   Cont CIR  Repeat head CT  reviewed, with increase in edema, discussed with neurosurgery previously, no changes in management. Repeat head CT reviewed, stable.  Per Neurosurg, no intervention planned unless worsening CT.  Please see PA note.   Appreciate IM recs  IV steroids started on 11/11, increased on 11/12  Appreciate palliative care consult 2.  DVT Prophylaxis/Anticoagulation: Mechanical: Sequential compression devices, below knee Bilateral lower extremities 3. Headaches/Pain Management: Limit sedating medications. Limit hydrocodone as needed for severe HA.   Topamax DC'd 4. Mood: LCSW to follow for evaluation and support when appropriate. Resumed home ativan prn at lower dose.   Provigil started on 11/14 5. Neuropsych: This patient is not capable of making decisions on her own behalf 6. Skin/Wound Care: Routine pressure relief measures 7. Fluids/Electrolytes/Nutrition: Monitor I's and O's.    D2 thins, advance diet as tolerated  Intake variable 8.  E. coli UTI: Completed 7 days of ctx/amoxicillin  Foley DC'd on 11/11 9.  HTN: Blood pressure goals less than  160/90.  Continue me Solu overhear swelling over here toprolol twice daily and increased Avapro to 300 mg on 11/13.   Labetalol as needed started on 11/14  Elevated on 11/14, likely exacerbated by steroids 10.  Seizure prophylaxis: Continue Keppra twice daily, DC'd on 11/12. 11.  Urinary retention: Discontinued Ditropan.  Continue Flomax routine pressure relief measures. 12.  Leukocytosis:   WBCs 14.3 on 11/13, confounded by steroids  ABX per IM DC'd on 11/11  Continue to monitor 13. Hyponatremia:   Sodium 131 and 11/13  Labs ordered for tomorrow  IVF DC'd by IM, restarted on 11/12, d/ced on 11/13  Continue to monitor 14. H/o Barrett's esophagus/GERD: On Protonix 15.  Hypokalemia  Potassium 3.7 on 11/13  Labs ordered for tomorrow  Supplement increased on 11/1 16.  Hypoalbuminemia  Supplement initiated on 11/7 17.  Acute blood loss  anemia  Hemoglobin 10.8 on 11/11 18.  Fevers and lethargy: Fevers resolved, lethargy persists - improving  Appreciate IM recs  Antibiotics DC'd 19.  Hypomagnesemia  Magnesium 1.7 on 11/13  Continue supplementation  20. Steroid induced hyperglycemia   Cont to monitor  LOS: 8 days A FACE TO FACE EVALUATION WAS PERFORMED  Shellby Schlink [name] 12/31/2017, 10:05 AM

## 2017-12-31 NOTE — Progress Notes (Signed)
Speech Language Pathology Weekly Progress and Session Note  Patient Details  Name: [name] MRN: [id number] Date of Birth: 05-01-1926  Beginning of progress report period: 12/24/2017   End of progress report period: 12/31/2017   Today's Date: 12/31/2017 SLP Individual Time: 1005-1015 SLP Individual Time Calculation (min): 10 min  Short Term Goals: Week 1: SLP Short Term Goal 1 (Week 1): Pt will consume dys 2 textures and thin liquids with min cues for use of swallowing precautions and minimal overt s/s of aspiration.  SLP Short Term Goal 1 - Progress (Week 1): Met SLP Short Term Goal 2 (Week 1): Pt will sustain her attention to basic, functional tasks for 5 minutes with mod cues for redirection.   SLP Short Term Goal 2 - Progress (Week 1): Progressing toward goal SLP Short Term Goal 3 (Week 1): Pt will complete basic, familiar tasks with mod assist for functional problem solving.   SLP Short Term Goal 3 - Progress (Week 1): Not met SLP Short Term Goal 4 (Week 1): Pt will locate items to the left of midline in ~50% of opportunities during basic, familiar tasks with mod assist.   SLP Short Term Goal 4 - Progress (Week 1): Not met    New Short Term Goals: Week 2: SLP Short Term Goal 1 (Week 2): Pt will consume trials of dys 3 textures and thin liquids with min cues for use of swallowing precautions and minimal overt s/s of aspiration over three consecutive sessions prior to advancement.   SLP Short Term Goal 2 (Week 2): Pt will sustain her attention to basic, functional tasks for 5 minutes with mod cues for redirection.   SLP Short Term Goal 3 (Week 2): Pt will complete basic, familiar tasks with mod assist for functional problem solving.   SLP Short Term Goal 4 (Week 2): Pt will locate items to the left of midline in ~50% of opportunities during basic, familiar tasks with mod assist.    Weekly Progress Updates:   Pt has made no functional gains this reporting period and has met 1  out of 4 short term goals.  Pt is currently max assist for tasks due to cognitive impairments and limited participation in therapies secondary to frequent stool incontinence and lethargy.  Pt is consuming dys 2, thin liquids diet with min cues for use of swallowing precautions when alert.  Progression has been limited by mentation and lethargy.  No family has been present for training this reporting period.  Pt would continue to benefit from skilled ST while inpatient in order to maximize functional independence and reduce burden of care prior to discharge.  Anticipate that pt will need 24/7 supervision at discharge in addition to [place] follow at next level of care.       Intensity: Minumum of 1-2 x/day, 30 to 90 minutes Frequency: Total of 15 hours over 7 days of combined therapies Duration/Length of Stay: 21-28 days  Treatment/Interventions: Cognitive remediation/compensation;Cueing hierarchy;Environmental controls;Functional tasks;Internal/external aids;Patient/family education   Daily Session  Skilled Therapeutic Interventions: Pt was seen for skilled ST targeting cognitive goals.  Pt was sleeping soundly upon arrival but would initially answer therapist's questions while keeping her eyes closed.   Nursing reports that pt had declined to eat breakfast this morning and pt indicated that she was interested in eating with therapist; however, pt became progressively more fatigued and could not even orally accept a straw to drink thin liquids.  Therapist attempted multiple techniques to maximize alertness (turned lights on,  raised head of bed upright, cold compress, sternal rub, loud voice) but with no success.  As a result, pt missed 50 minutes of skilled ST.        General    Pain Pain Assessment Pain Scale: 0-10 Pain Score: 0-No pain  Therapy/Group: Individual Therapy  [name], [name] 12/31/2017, 4:14 PM

## 2017-12-31 NOTE — Progress Notes (Signed)
Physical Therapy Session Note  Patient Details  Name: [name] MRN: [id number] Date of Birth: 09-13-1926  Today's Date: 12/31/2017 PT Individual Time: 0800-0830 PT Individual Time Calculation (min): 30 min   Short Term Goals: Week 1:  PT Short Term Goal 1 (Week 1): Pt will perform bed mobility with max assist of 1 consistently  PT Short Term Goal 2 (Week 1): Pt will sit EOB x 3 min with mod assist  PT Short Term Goal 3 (Week 1): Pt will perform bed<>WC transfers with max assist  PT Short Term Goal 4 (Week 1): Pt will tolerate sitting in WC for up to 2 hours to prepare for out of bed mobility       Skilled Therapeutic Interventions/Progress Updates:   Pt supine in bed, HOB raised.  Pt reported that she was wet.  Rolling L><R with max> total assist, with hand over hand assist to place hands on railings.  Dry brief and pants donned by PT.  Pt assisted in pulling pants up using R hand.  neuromuscular re-education via multimodal cues and visual feedback for LLE hip and knee extension, heel slides, ankle circles.  PROM L heel cord.  Pt left lying in bed, HOB raised, with needs at hand.  PT informed Leeann, NT of pt's status.     Therapy Documentation Precautions:  Precautions Precautions: Fall Precaution Comments: pushing left, decreased attention, left hemiparesis Restrictions Weight Bearing Restrictions: No General:   Vital Signs: Therapy Vitals BP: (!) 174/98 Pain: 7/10 HA; Lonn Georgia, RN planning to come at end of session to give meds       Therapy/Group: Individual Therapy  Leannah Guse 12/31/2017, 9:00 AM

## 2017-12-31 NOTE — Progress Notes (Signed)
Per Olivia Mackie in PT, noted skin tear to LU arm. Nurse assessed skin tear, cleaned with NS and applied tegaderm. No bleeding noted present. Pt no c/o pain at this time. Will cont to monitor.   Erie Noe, LPN 6:74 AM, 25/52/58

## 2017-12-31 NOTE — Progress Notes (Signed)
PHARMACIST - PHYSICIAN COMMUNICATION  DR:   Posey Pronto  CONCERNING: IV to Oral Route Change Policy  RECOMMENDATION: This patient is receiving protonix by the intravenous route.  Based on criteria approved by the Pharmacy and Therapeutics Committee, the intravenous medication(s) is/are being converted to the equivalent oral dose form(s).   DESCRIPTION: These criteria include:  The patient is eating (either orally or via tube) and/or has been taking other orally administered medications for a least 24 hours  The patient has no evidence of active gastrointestinal bleeding or impaired GI absorption (gastrectomy, short bowel, patient on TNA or NPO).  If you have questions about this conversion, please contact the Pharmacy Department  []   [phone] )  [facility] []   [phone] )  [facility] [x]   [phone] )  [facility] []   [phone] )  [facility] []   [phone] )  [place], [place] 12/31/2017 8:35 AM

## 2017-12-31 NOTE — Progress Notes (Addendum)
Daily Progress Note   Patient Name: [name]       Date: 12/31/2017 DOB: [birth year]  Age: 84 y.o. MRN#: [id number] Attending Physician: Jamse Arn, MD Primary Care Physician: Merrilee Seashore, MD Admit Date: 12/23/2017  Reason for Consultation/Follow-up: Establishing goals of care  Subjective: Patient is resting in bed with her eyes closed. She remains on Decadron. Spoke with her daughter. Daughter has signed the MOST form. She states her mother's intake has been poor. Her mother would not want a feeding tube.   I completed a MOST form today and the signed original was placed in the chart. A photocopy was placed in the chart to be scanned into EMR. The patient/family outlined their wishes for the following treatment decisions:  Cardiopulmonary Resuscitation: Do Not Attempt Resuscitation (DNR/No CPR)  Medical Interventions: Limited Additional Interventions: Use medical treatment, IV fluids and cardiac monitoring as indicated, DO NOT USE intubation or mechanical ventilation. May consider use of less invasive airway support such as BiPAP or CPAP. Also provide comfort measures. Transfer to the hospital if indicated. Avoid intensive care.   Antibiotics: Antibiotics if indicated  IV Fluids: IV fluids if indicated  Feeding Tube: No feeding tube     Length of Stay: 8  Current Medications: Scheduled Meds:  . acetaminophen  650 mg Oral Q4H  . calcium carbonate  1 tablet Oral Q breakfast  . cholecalciferol  1,000 Units Oral Daily  . dexamethasone  4 mg Intravenous Q6H  . docusate sodium  100 mg Oral BID  . feeding supplement (PRO-STAT SUGAR FREE 64)  30 mL Oral BID  . irbesartan  300 mg Oral Daily  . magnesium oxide  400 mg Oral BID  . mouth rinse  15 mL Mouth Rinse BID  .  metoprolol tartrate  50 mg Oral BID  . modafinil  100 mg Oral Daily  . [START ON 01/01/2018] pantoprazole  40 mg Oral Daily  . polyethylene glycol  17 g Oral Daily  . potassium chloride  30 mEq Oral BID  . ranitidine  150 mg Oral BID  . tamsulosin  0.4 mg Oral Daily    Continuous Infusions:   PRN Meds: acetaminophen, alum & mag hydroxide-simeth, bisacodyl, cloNIDine, guaiFENesin-dextromethorphan, labetalol, lidocaine, nitroGLYCERIN, ondansetron (ZOFRAN) IV, polyethylene glycol, prochlorperazine **OR** prochlorperazine **OR** prochlorperazine, simethicone, sodium phosphate  Physical Exam  Constitutional: No distress.  Pulmonary/Chest: Effort normal.  Neurological: Coordination abnormal.  Skin: Skin is warm and dry.            Vital Signs: BP (!) 174/98 (BP Location: Right Arm)   Pulse 72   Temp 98.9 F (37.2 C) (Oral)   Resp 17   Ht 5\' 5"  (1.651 m)   Wt 84.1 kg   SpO2 98%   BMI 30.85 kg/m  SpO2: SpO2: 98 % O2 Device: O2 Device: Room Air O2 Flow Rate:    Intake/output summary:   Intake/Output Summary (Last 24 hours) at 12/31/2017 1144 Last data filed at 12/31/2017 0854 Gross per 24 hour  Intake 240 ml  Output 1253 ml  Net -1013 ml   LBM: Last BM Date: 12/31/17 Baseline Weight: Weight: 84.1 kg Most recent weight: Weight: 84.1 kg       Palliative Assessment/Data: 40%    Flowsheet Rows     Most Recent Value  Intake Tab  Referral Department  -- [Rehab]  Unit at Time of Referral  Other (Comment)  Palliative Care Primary Diagnosis  Neurology  Date Notified  12/29/17  Palliative Care Type  New Palliative care  Reason for referral  Clarify Goals of Care, Counsel Regarding Hospice  Date of Admission  12/23/17  Date first seen by Palliative Care  12/29/17  # of days Palliative referral response time  0 Day(s)  # of days IP prior to Palliative referral  6  Clinical Assessment  Psychosocial & Spiritual Assessment  Palliative Care Outcomes      Patient Active  Problem List   Diagnosis Date Noted  . Steroid-induced hyperglycemia   . Benign essential HTN   . Labile blood pressure   . Hypertensive crisis   . Cerebral edema (HCC)   . Hypomagnesemia   . Dysphagia, post-stroke   . Lethargy   . Acute blood loss anemia   . Hypoalbuminemia due to protein-calorie malnutrition ([place])   . ICH (intracerebral hemorrhage) ([place]) 12/23/2017  . History of Barrett's esophagus   . Urinary retention   . Seizure prophylaxis   . E. coli UTI   . Intraventricular hemorrhage ([place])   . Urinary tract infection without hematuria   . Coronary artery disease involving native coronary artery of native heart without angina pectoris   . Pulmonary HTN ([place])   . Leukocytosis   . Intracranial hemorrhage ([place]) 12/18/2017  . Syncope 04/10/2015  . Sepsis secondary to UTI ([place]) 04/10/2015  . URI (upper respiratory infection) 04/10/2015  . UTI (lower urinary tract infection) 04/10/2015  . QT prolongation 10/12/2014  . Headache 09/02/2012  . Tricuspid valve regurgitation, moderate 08/18/2012  . Hypokalemia 08/16/2012  . Hyponatremia 08/16/2012  . Atrial fibrillation ([place])   . Hypertension   . Anxiety   . Mixed hyperlipidemia 09/11/2009  . GERD 08/30/2008  . CHEST PAIN 08/30/2008  . Coronary atherosclerosis 04/04/2008  . Syncope and collapse 04/04/2008    Palliative Care Assessment & Plan    Recommendations/Plan:  Recommend palliative at D/C.   Will shadow for decline.     Code Status:    Code Status Orders  (From admission, onward)         Start     Ordered   12/23/17 1656  Do not attempt resuscitation (DNR)  Continuous    Question Answer Comment  In the event of cardiac or respiratory ARREST Do not call a "code blue"   In the event of cardiac or respiratory  ARREST Do not perform Intubation, CPR, defibrillation or ACLS   In the event of cardiac or respiratory ARREST Use medication by any route, position, wound care, and other measures to relive pain  and suffering. May use oxygen, suction and manual treatment of airway obstruction as needed for comfort.      12/23/17 1655        Code Status History    Date Active Date Inactive Code Status Order ID Comments User Context   12/23/2017 1655 12/23/2017 1655 DNR [id number]  Flora Lipps Inpatient   12/18/2017 1800 12/23/2017 1620 DNR [id number]  Traci Sermon, PA-C ED   04/10/2015 2047 04/13/2015 1542 Full Code [id number]  Etta Quill, DO ED   10/12/2014 1806 10/13/2014 1632 Full Code [id number]  Samella Parr, NP Inpatient    Advance Directive Documentation     Most Recent Value  Type of Advance Directive  Healthcare Power of Attorney, Living will  Pre-existing out of facility DNR order (yellow form or pink MOST form)  -  "MOST" Form in Place?  -       Prognosis:   Unable to determine  Discharge Planning:  [place] for rehab with Palliative care service follow-up  Care plan was discussed with RN  Thank you for allowing the Palliative Medicine Team to assist in the care of this patient.   Total Time 35 min Prolonged Time Billed no      Greater than 50%  of this time was spent counseling and coordinating care related to the above assessment and plan.  Asencion Gowda, NP  Please contact Palliative Medicine Team phone at [phone] for questions and concerns.

## 2017-12-31 NOTE — Progress Notes (Signed)
Occupational Therapy Session Note  Patient Details  Name: [name] MRN: [id number] Date of Birth: Mar 15, 1926  Today's Date: 12/31/2017 OT Individual Time: 1330-1400 OT Individual Time Calculation (min): 30 min    Skilled Therapeutic Interventions/Progress Updates: ;patient seated sitting upright with head of bed elevated upon approach for this OT session. no +2 availble this session.   Her participation today was as follows:  Strong right gaze and strong left in attention/neglect. She participated in visual tracking on this clinician's hand or body to her left, midline and right environments.  At times this clinician applied Active assistant left lateral rotation to help her complete neck rotated to her left environment.     At times she placed her right hand against her left cheek when she spoke as if she was thinking to answer questions or discuss topics of interest, but afterwards when this clinician asked her to point to her left hand, after this clinician placed it near her right, she could not identify it as her left hand.   She stated she could not see it, or she raised her right hand instead.  She was able to use her core/trunk muslces to pull forward and toward her right without holding rails, and when this clinician provided hand over hand assist with her left hand and CGA to min assist, she was able to engage her left side trunkand back muscles to further pull herself forward (seated in her bed) to midline  Sitting upright withback supported in bed, she reached laterally left and to/from midline and flex forward and right lateral her trunk to help decrease strong leftward pushing.  During the session, patient often spoke of her dissatisifaction with two of her optiicians that she stated could not 'get her prescription glasses correctly made.'   She stated her daughter had no success in trying to help her with the task.  At end of session, bed alarm  Was engaged; she  demonstrated pressing correct button on call bell and on bed to reach the nurse.   This clinician assisted her with finding a television program to watch and placed her phone within reach.     Therapy Documentation Precautions:  Precautions Precautions: Fall Precaution Comments: pushing left, decreased attention, left hemiparesis Restrictions Weight Bearing Restrictions: No Pain: Pain Assessment Pain Scale: 0-10 Pain Score: 0-No pain    Vision not within functional limits.  She states she has macular degeneration and that her glasses were not suitable prior to this admission. Perception strong left inattention and neglect     Therapy/Group: Individual Therapy  Alfredia Ferguson [facility] 12/31/2017, 7:49 PM

## 2017-12-31 NOTE — Progress Notes (Addendum)
Patient ID: [name], female   DOB: 1927-02-03, 84 y.o.   MRN: [id number]  PROGRESS NOTE    [name]  WNI:[id number] DOB: 01/10/1927 DOA: 12/23/2017 PCP: Merrilee Seashore, MD   Brief Narrative:  84 y.o. female with history of dementia, chronic atrial fibrillation on Xarelto until recently, pulmonary hypertension, Barrett's esophagus who was recently admitted on 12/18/2017 patient for a large intraventricular hemorrhage which was treated conservatively along with E. coli UTI which was initially treated with Rocephin and switched to Keflex and discharged to inpatient rehab on 12/23/2017.  Hospitalist service was consulted on 12/25/2017 for fever and increased lethargy.  Patient was started on broad-spectrum antibiotics on 12/25/2017.  Antibiotics were de-escalated and stopped on 11/11.  Keppra and Topamax was also discontinued.  CT scan showed more or less stable findings with perhaps mild midline shift.  Patient was started on dexamethasone.   Assessment & Plan:   Acute metabolic encephalopathy with fever Etiology for her fever is not entirely clear.  No infectious source has been found.  UA was unremarkable.  Chest x-ray did not show any infiltrates.  Cultures without any growth.  Patient was initially started on vancomycin and Zosyn.  Both of these have been discontinued.  Patient was recently treated with ceftriaxone for E. coli UTI.  Urine culture subsequently have been without any growth.  She has not had any further episodes of fever. -CT of the head on 12/25/2017 showed no new bleeding but increased vasogenic edema in the right temporal lobe.  This has been discussed by rehab MD with neurosurgery.  No plans for surgical intervention.  Patient was started on dexamethasone.  She was also taken off of Topamax and Keppra as these medications can cause lethargy and drowsiness.  Mental status significantly improved.  Leave her off of Keppra and Topamax for now.  Keppra was initiated as a  seizure prophylaxis due to her intracranial hemorrhage.  She never had any seizure activity.  Discussed with neurosurgery, Mr. Cato Mulligan.  Okay to leave her off of [place].  They recommend tapering down dexamethasone over the next few days.  May have to increase the dose if her mentation gets worse while she is being tapered down.  Plan to start cutting back on the dexamethasone from tomorrow.  Recent intraventricular hemorrhage -Conservatively treated.    See discussion above.  Patient started on dexamethasone due to presence of midline shift.  Patient remains stable to improved.  Keep her on current dose of dexamethasone.  Slowly taper down over the next few days.  Leukocytosis -Mild. Elevated WBC is most likely due to steroids.  Recent E. coli UTI -Status post completion of antibiotic treatment.  E. coli was pansensitive.  Urine culture from 11/8 without any growth.  Essential hypertension Blood pressure was noted to be elevated.  Dose of metoprolol was increased to 50 mg twice a day.  It looks like the dose of the ARB has been increased to 300 mg once a day as of today.  We will see how blood pressure response to this regimen.   Chronic atrial fibrillation -Rate controlled.  Continue metoprolol.  Off Xarelto since recent intraventricular hemorrhage  Hypokalemia -Replaced.  Improved  Hyponatremia -Stable.  IV fluids has been restarted by primary team because of poor oral intake.  Sodium level is stable.  Generalized deconditioning -Patient's mentation has improved although she remains weak.  Palliative medicine is following.  Antimicrobials:  Vancomycin 12/25/2017-12/27/2017 Zosyn from 12/25/2017-12/28/2017 Patient was on Rocephin from 12/18/2017-12/23/2017  and subsequently on Keflex from 12/23/2017-12/24/2017  Subjective: Awake alert.  Denies any nausea vomiting.  Continues to have frontal headache though no worse compared to yesterday.    Objective: Vitals:   12/30/17 1927  12/31/17 0325 12/31/17 0731 12/31/17 1341  BP: (!) 176/83 (!) 174/81 (!) 174/98 (!) 168/85  Pulse: 68 72  77  Resp: 19 17  17   Temp: 98 F (36.7 C) 98.9 F (37.2 C)  97.8 F (36.6 C)  TempSrc:  Oral  Oral  SpO2: 97% 98%  97%  Weight:      Height:        Intake/Output Summary (Last 24 hours) at 12/31/2017 1413 Last data filed at 12/31/2017 1351 Gross per 24 hour  Intake 480 ml  Output 1853 ml  Net -1373 ml   Filed Weights   12/23/17 1624  Weight: 84.1 kg    Examination:  General exam: Awake alert.  Mildly distracted. Respiratory system: Normal effort at rest.  Clear to auscultation bilaterally.  No wheezing rales or rhonchi Cardiovascular system: S1-S2 is normal regular.  No S3-S4.  No rubs murmurs or bruit Gastrointestinal system: Abdomen is soft.  Nontender nondistended   Data Reviewed: I have personally reviewed following labs and imaging studies  CBC: Recent Labs  Lab 12/25/17 0458 12/26/17 0610 12/26/17 0844 12/27/17 0620 12/28/17 0607 12/30/17 0503  WBC 12.7*  --  12.8* 10.5 11.7* 14.8*  NEUTROABS 9.1*  --   --  6.6  --  12.5*  HGB 10.9*  --  10.4* 10.5* 10.8* 11.9*  HCT 33.8* 29.2* 30.4* 33.2* 33.3* 35.9*  MCV 88.5  --  87.1 89.7 88.3 88.6  PLT 230  --  203 154 237 144   Basic Metabolic Panel: Recent Labs  Lab 12/25/17 0458 12/26/17 0610 12/27/17 0620 12/28/17 0607 12/30/17 0503  NA 132* 129* 133* 130* 131*  K 3.5 2.9* 3.7 3.5 3.7  CL 95* 97* 103 99 100  CO2 28 23 23 23 25   GLUCOSE 98 158* 132* 120* 150*  BUN 12 16 13 11 17   CREATININE 0.65 0.84 0.81 0.65 0.68  CALCIUM 8.8* 8.4* 8.3* 8.5* 9.0  MG  --   --  1.5* 1.4* 1.7   GFR: Estimated Creatinine Clearance: 51 mL/min (by C-G formula based on SCr of 0.68 mg/dL). Liver Function Tests: Recent Labs  Lab 12/26/17 0610 12/27/17 0620  AST 32 31  ALT 27 28  ALKPHOS 64 64  BILITOT 1.4* 1.1  PROT 5.8* 5.8*  ALBUMIN 2.5* 2.4*    Recent Labs  Lab 12/26/17 0610  AMMONIA 36*    CBG: No results for input(s): GLUCAP in the last 168 hours.   Recent Results (from the past 240 hour(s))  Culture, Urine     Status: None   Collection Time: 12/25/17  2:03 PM  Result Value Ref Range Status   Specimen Description URINE, CATHETERIZED  Final   Special Requests NONE  Final   Culture   Final    NO GROWTH Performed at [facility], [address] [place], [place] [address]    Report Status 12/26/2017 FINAL  Final  Culture, blood (routine x 2)     Status: None   Collection Time: 12/25/17  2:36 PM  Result Value Ref Range Status   Specimen Description BLOOD LEFT HAND  Final   Special Requests   Final    BOTTLES DRAWN AEROBIC ONLY Blood Culture adequate volume   Culture   Final  NO GROWTH 5 DAYS Performed at [facility], [place] [address], [place], [place] [address]    Report Status 12/30/2017 FINAL  Final  Culture, blood (routine x 2)     Status: None   Collection Time: 12/25/17  2:40 PM  Result Value Ref Range Status   Specimen Description BLOOD RIGHT HAND  Final   Special Requests   Final    BOTTLES DRAWN AEROBIC ONLY Blood Culture adequate volume   Culture   Final    NO GROWTH 5 DAYS Performed at [facility], [place] [address], [place], [place] [address]    Report Status 12/30/2017 FINAL  Final         Radiology Studies: No results found.   Scheduled Meds: . acetaminophen  650 mg Oral Q4H  . calcium carbonate  1 tablet Oral Q breakfast  . cholecalciferol  1,000 Units Oral Daily  . dexamethasone  4 mg Intravenous Q6H  . docusate sodium  100 mg Oral BID  . feeding supplement (PRO-STAT SUGAR FREE 64)  30 mL Oral BID  . irbesartan  300 mg Oral Daily  . magnesium oxide  400 mg Oral BID  . mouth rinse  15 mL Mouth Rinse BID  . metoprolol tartrate  50 mg Oral BID  . modafinil  100 mg Oral Daily  . [START ON 01/01/2018] pantoprazole  40 mg Oral Daily  . polyethylene glycol  17 g Oral Daily  . potassium chloride  30 mEq Oral BID  .  ranitidine  150 mg Oral BID  . tamsulosin  0.4 mg Oral Daily   Continuous Infusions:    LOS: 8 days      Bonnielee Haff, MD [facility] Pager [phone]  If 7PM-7AM, please contact night-coverage [email or web address] Password [email or web address] 12/31/2017, 2:13 PM

## 2018-01-01 ENCOUNTER — Inpatient Hospital Stay (HOSPITAL_COMMUNITY): Payer: Medicare Other | Admitting: Occupational Therapy

## 2018-01-01 ENCOUNTER — Inpatient Hospital Stay (HOSPITAL_COMMUNITY): Payer: Medicare Other | Admitting: Speech Pathology

## 2018-01-01 ENCOUNTER — Inpatient Hospital Stay (HOSPITAL_COMMUNITY): Payer: Medicare Other | Admitting: Physical Therapy

## 2018-01-01 LAB — BASIC METABOLIC PANEL
ANION GAP: 12 (ref 5–15)
BUN: 18 mg/dL (ref 8–23)
CO2: 22 mmol/L (ref 22–32)
Calcium: 9 mg/dL (ref 8.9–10.3)
Chloride: 95 mmol/L — ABNORMAL LOW (ref 98–111)
Creatinine, Ser: 0.64 mg/dL (ref 0.44–1.00)
GFR calc non Af Amer: >60 mL/min (ref >60)
Glucose, Bld: 143 mg/dL — ABNORMAL HIGH (ref 70–99)
Potassium: 3.6 mmol/L (ref 3.5–5.1)
Sodium: 129 mmol/L — ABNORMAL LOW (ref 135–145)

## 2018-01-01 MED ORDER — POTASSIUM CHLORIDE 20 MEQ PO PACK
30.0000 meq | PACK | Freq: Two times a day (BID) | ORAL | Status: DC
  Administered 2018-01-01 – 2018-01-03 (×4): 30 meq via ORAL
  Filled 2018-01-01 (×5): qty 2

## 2018-01-01 MED ORDER — DEXAMETHASONE SODIUM PHOSPHATE 4 MG/ML IJ SOLN
4.0000 mg | Freq: Three times a day (TID) | INTRAMUSCULAR | Status: DC
  Administered 2018-01-01 – 2018-01-04 (×9): 4 mg via INTRAVENOUS
  Filled 2018-01-01 (×10): qty 1

## 2018-01-01 NOTE — Progress Notes (Signed)
Occupational Therapy Session Note  Patient Details  Name: [name] MRN: [id number] Date of Birth: 01-12-1927  Today's Date: 01/01/2018 OT Individual Time: 4967-5916 OT Individual Time Calculation (min): 45 min    Short Term Goals: Week 1:  OT Short Term Goal 1 (Week 1): Pt will transition to EOB with max +2 OT Short Term Goal 1 - Progress (Week 1): Not met OT Short Term Goal 2 (Week 1): Pt will maintain EOB sitting balance with max +2 OT Short Term Goal 2 - Progress (Week 1): Not met OT Short Term Goal 3 (Week 1): Pt will complete grooming task at sink with mod A OT Short Term Goal 3 - Progress (Week 1): Not met OT Short Term Goal 4 (Week 1): Pt will don shirt with max A OT Short Term Goal 4 - Progress (Week 1): Not met Week 2:  OT Short Term Goal 1 (Week 2): Pt will maintain sustained attention for 15 mins to ADL task with no more than min instructional cueing to keep her eyes open.  OT Short Term Goal 2 (Week 2): Pt will complete UB bathing with mod instructional cueing and min assist in supported sitting.  OT Short Term Goal 3 (Week 2): Pt will sit EOB with mod assist for static sitting balance for at least 2 mins in preparation for selfcare tasks or transfers. OT Short Term Goal 4 (Week 2): Pt will perform sit to stand with max assist during LB selfcare tasks.  OT Short Term Goal 5 (Week 2): Pt will use the LUE as a gross assist with max hand over hand facilitation for washing the right arm during bathing tasks.   Skilled Therapeutic Interventions/Progress Updates:    Pt seen this session to facilitate L visual scanning, use of LUE, attention to task with basic self care.   Pt received in bed and was initially lethargic but then became alert and very engaged in conversation.   For bathing from bed level, pt did initiate UB bathing and actively lifted L arm to wash underneath. Pt attended to her L arm and did scan left to look at this therapist.  She followed directions well  for 1 step tasks.  2nd helper present to help with LB dressing and adjustment of pt in bed. Pt set up in bed with pillows to support alignment and bed alarm on.   Therapy Documentation Precautions:  Precautions Precautions: Fall Precaution Comments: pushing left, decreased attention, left hemiparesis Restrictions Weight Bearing Restrictions: No  Pain: Pain Assessment Pain Scale: Faces Pain Score: 0-No pain ADL: ADL Eating: Maximal assistance Where Assessed-Eating: Bed level Grooming: Maximal assistance Where Assessed-Grooming: Bed level Upper Body Bathing: Maximal assistance Where Assessed-Upper Body Bathing: Bed level Lower Body Bathing: Dependent Where Assessed-Lower Body Bathing: Bed level Upper Body Dressing: Dependent Where Assessed-Upper Body Dressing: Bed level Lower Body Dressing: Unable to assess Toileting: Dependent Where Assessed-Toileting: Bed level Toilet Transfer: Unable to assess Tub/Shower Transfer: Unable to assess Gaffer Transfer: Unable to assess   Therapy/Group: Individual Therapy  [place] 01/01/2018, 12:40 PM

## 2018-01-01 NOTE — Plan of Care (Signed)
Pt's Long term goals down graded due to lack of progress over the past week due to medical instability and increased lethargy. See Care Plan for details.

## 2018-01-01 NOTE — Progress Notes (Signed)
Physical Therapy Weekly Progress Note  Patient Details  Name: [name] MRN: [id number] Date of Birth: Feb 04, 1927  Beginning of progress report period: December 24, 2017 End of progress report period: January 01, 2018  Today's Date: 01/01/2018 PT Individual Time:1115-1200   45 min   Patient has met 0 of 5 short term goals. Pt has had considerable medical stability issues over the last week with increase lethargy for the past 6 days, limiting patient participation and progress. Over the last 2 days, pt has been noted to be alert and partially oriented to situation allowing some OOB activity and improved participation in therapies. This pt is still max-total assist for bed mobility to come to sitting EOB and dependent transfer with maxi move or + 3 in stedy. 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   Patient continues to demonstrate the following deficits muscle weakness and muscle joint tightness, decreased cardiorespiratoy endurance, impaired timing and sequencing, motor apraxia, decreased coordination and decreased motor planning, decreased visual perceptual skills, decreased visual motor skills and field cut, decreased midline orientation, decreased attention to left, left side neglect and decreased motor planning, decreased initiation, decreased attention, decreased awareness, decreased problem solving, decreased safety awareness, decreased memory and delayed processing and decreased sitting balance, decreased standing balance, decreased postural control, hemiplegia and decreased balance strategies and therefore will continue to benefit from skilled PT intervention to increase functional independence with mobility.  Patient not progressing toward long term goals.  See goal revision..  Continue plan of care.  PT Short Term Goals Week 1:  PT Short Term Goal 1 (Week 1): Pt will perform bed mobility with max assist of 1 consistently   PT Short Term Goal 1 - Progress (Week 1): Progressing toward goal PT Short Term Goal 2 (Week 1): Pt will sit EOB x 3 min with mod assist  PT Short Term Goal 2 - Progress (Week 1): Not met PT Short Term Goal 3 (Week 1): Pt will perform bed<>WC transfers with max assist  PT Short Term Goal 3 - Progress (Week 1): Not met PT Short Term Goal 4 (Week 1): Pt will tolerate sitting in WC for up to 2 hours to prepare for out of bed mobility  PT Short Term Goal 4 - Progress (Week 1): Not met Week 2:  PT Short Term Goal 1 (Week 2): Pt will perform bed WC transfer with Max assist of 1.  PT Short Term Goal 2 (Week 2): Pt Will initiate WC mobility training  PT Short Term Goal 3 (Week 2): Pt will maintain sitting balacne with moderate assist up to 5 minutes   Skilled Therapeutic Interventions/Progress Updates:   Pt received supine in bed and agreeable to PT. Supine>sit transfer with total assist and max cues for attention to the LLE and LUE. Sitting balance EOB x 5 minutes with max assist from PT and max cues for WB through R elbow to prevent pushers syndrome to the L.   Stedy transfer to [facility] with Max-total assist +3 for safety, midline orientation and erect posture.   Semi standing in stedy 2 x 5 minutes with max assist from PT to maintain gaze at midline and attempt to initate righting response to LOB L using visual and tactile feed back at mirror. P  Sitting balance in WC with sustained L side shoulder flexion to facilitate trunk elongation and improved awareness of midline. Reaching to the R to force R weight shift x 4  with 5 second hold. Pt able to achieve lateral weight shift to midline only, but unable to progress past midline.   Patient returned to room and left sitting in [facility] with call bell in reach and all needs met.        Therapy Documentation Precautions:  Precautions Precautions: Fall Precaution Comments: pushing left, decreased attention, left hemiparesis Restrictions Weight Bearing  Restrictions: No    Vital Signs: Therapy Vitals Pulse Rate: 67 Resp: 18 BP: (!) 155/88 Patient Position (if appropriate): Sitting Oxygen Therapy SpO2: 99 % O2 Device: Room Air Pain: Pain Assessment Pain Scale: 0-10 Pain Score: 0-No pain   Therapy/Group: Individual Therapy  [name] 01/01/2018, 3:52 PM

## 2018-01-01 NOTE — Progress Notes (Addendum)
Patient ID: [name], female   DOB: 08/07/1926, 84 y.o.   MRN: [id number]  PROGRESS NOTE    [name]  EVO:[id number] DOB: [birth year] DOA: 12/23/2017 PCP: Merrilee Seashore, MD   Brief Narrative:  84 y.o. female with history of dementia, chronic atrial fibrillation on Xarelto until recently, pulmonary hypertension, Barrett's esophagus who was recently admitted on 12/18/2017 patient for a large intraventricular hemorrhage which was treated conservatively along with E. coli UTI which was initially treated with Rocephin and switched to Keflex and discharged to inpatient rehab on 12/23/2017.  Hospitalist service was consulted on 12/25/2017 for fever and increased lethargy.  Patient was started on broad-spectrum antibiotics on 12/25/2017.  Antibiotics were de-escalated and stopped on 11/11.  Keppra and Topamax was also discontinued.  CT scan showed more or less stable findings with perhaps mild midline shift.  Patient was started on dexamethasone.   Assessment & Plan:   Acute metabolic encephalopathy with fever Etiology for her fever is not entirely clear.  No infectious source has been found.  UA was unremarkable.  Chest x-ray did not show any infiltrates.  Cultures without any growth.  Patient was initially started on vancomycin and Zosyn.  Both of these have been discontinued.  Patient was recently treated with ceftriaxone for E. coli UTI.  Urine culture subsequently have been without any growth.  She has not had any further episodes of fever. -CT of the head on 12/25/2017 showed no new bleeding but increased vasogenic edema in the right temporal lobe.  This has been discussed by rehab MD with neurosurgery.  No plans for surgical intervention.  Patient was started on dexamethasone.  She was also taken off of Topamax and Keppra as these medications can cause lethargy and drowsiness.  Mental status significantly improved.  Leave her off of Keppra and Topamax for now.  Keppra was initiated as a  seizure prophylaxis due to her intracranial hemorrhage.  She never had any seizure activity.  Discussed with neurosurgery, Mr. Cato Mulligan, on 11/14.  Okay to leave her off of [place].  They recommend tapering down dexamethasone over the next few days.  May have to increase the dose if her mentation gets worse while she is being tapered down.  It appears that the primary team is already started tapering down dexamethasone.  Will recommend doing that over the next 6 to 8 days while monitoring her mental status.  Recent intraventricular hemorrhage -Conservatively treated.    See discussion above.  Patient started on dexamethasone due to presence of midline shift.  Patient remains stable to improved.    Leukocytosis -Mild. Elevated WBC is most likely due to steroids.  Recent E. coli UTI -Status post completion of antibiotic treatment.  E. coli was pansensitive.  Urine culture from 11/8 without any growth.  Essential hypertension Blood pressure was noted to be elevated.  Dose of metoprolol was increased to 50 mg twice a day.  It looks like the dose of the ARB has been increased to 300 mg once a day as of today.  Continuing to elevated blood pressure.  Will not recommend any further changes to antihypertensives and see how blood pressure response to tapering of the steroids.   Chronic atrial fibrillation -Rate controlled.  Continue metoprolol.  Off Xarelto since recent intraventricular hemorrhage  Hypokalemia -Replaced.  Improved  Hyponatremia -Noted to be low but stable.  This could be due to an element of SIADH considering her intracranial hemorrhage.  Generalized deconditioning -Patient's mentation has improved although she  remains weak.  Palliative medicine is following.  Antimicrobials:  Vancomycin 12/25/2017-12/27/2017 Zosyn from 12/25/2017-12/28/2017 Patient was on Rocephin from 12/18/2017-12/23/2017 and subsequently on Keflex from 12/23/2017-12/24/2017  Subjective: Patient denies any  complaints.  Headache is better.  Her daughter is at the bedside.  Objective: Vitals:   12/31/17 1341 12/31/17 2023 01/01/18 0400 01/01/18 0523  BP: (!) 168/85 (!) 162/83 (!) 170/85 (!) 159/99  Pulse: 77 73 72   Resp: 17 19 18    Temp: 97.8 F (36.6 C) 99.2 F (37.3 C) 98.6 F (37 C)   TempSrc: Oral  Oral   SpO2: 97% 98% 98%   Weight:      Height:        Intake/Output Summary (Last 24 hours) at 01/01/2018 1450 Last data filed at 12/31/2017 1900 Gross per 24 hour  Intake 240 ml  Output 500 ml  Net -260 ml   Filed Weights   12/23/17 1624  Weight: 84.1 kg    Examination:  General exam: Awake and alert.  Mildly distracted Respiratory system: Normal effort at rest.  Clear to auscultation bilaterally Cardiovascular system: S1-S2 is normal regular.  No S3-S4.  No rubs murmurs or bruit   Data Reviewed: I have personally reviewed following labs and imaging studies  CBC: Recent Labs  Lab 12/26/17 0610 12/26/17 0844 12/27/17 0620 12/28/17 0607 12/30/17 0503  WBC  --  12.8* 10.5 11.7* 14.8*  NEUTROABS  --   --  6.6  --  12.5*  HGB  --  10.4* 10.5* 10.8* 11.9*  HCT 29.2* 30.4* 33.2* 33.3* 35.9*  MCV  --  87.1 89.7 88.3 88.6  PLT  --  203 154 237 546   Basic Metabolic Panel: Recent Labs  Lab 12/26/17 0610 12/27/17 0620 12/28/17 0607 12/30/17 0503 01/01/18 0455  NA 129* 133* 130* 131* 129*  K 2.9* 3.7 3.5 3.7 3.6  CL 97* 103 99 100 95*  CO2 23 23 23 25 22   GLUCOSE 158* 132* 120* 150* 143*  BUN 16 13 11 17 18   CREATININE 0.84 0.81 0.65 0.68 0.64  CALCIUM 8.4* 8.3* 8.5* 9.0 9.0  MG  --  1.5* 1.4* 1.7  --    GFR: Estimated Creatinine Clearance: 51 mL/min (by C-G formula based on SCr of 0.64 mg/dL). Liver Function Tests: Recent Labs  Lab 12/26/17 0610 12/27/17 0620  AST 32 31  ALT 27 28  ALKPHOS 64 64  BILITOT 1.4* 1.1  PROT 5.8* 5.8*  ALBUMIN 2.5* 2.4*    Recent Labs  Lab 12/26/17 0610  AMMONIA 36*   CBG: No results for input(s): GLUCAP in the  last 168 hours.   Recent Results (from the past 240 hour(s))  Culture, Urine     Status: None   Collection Time: 12/25/17  2:03 PM  Result Value Ref Range Status   Specimen Description URINE, CATHETERIZED  Final   Special Requests NONE  Final   Culture   Final    NO GROWTH Performed at [facility], [address] [place], [place] [address]    Report Status 12/26/2017 FINAL  Final  Culture, blood (routine x 2)     Status: None   Collection Time: 12/25/17  2:36 PM  Result Value Ref Range Status   Specimen Description BLOOD LEFT HAND  Final   Special Requests   Final    BOTTLES DRAWN AEROBIC ONLY Blood Culture adequate volume   Culture   Final    NO GROWTH 5 DAYS Performed at  [facility], [place] [address] [place], [place] [address]    Report Status 12/30/2017 FINAL  Final  Culture, blood (routine x 2)     Status: None   Collection Time: 12/25/17  2:40 PM  Result Value Ref Range Status   Specimen Description BLOOD RIGHT HAND  Final   Special Requests   Final    BOTTLES DRAWN AEROBIC ONLY Blood Culture adequate volume   Culture   Final    NO GROWTH 5 DAYS Performed at [facility], [place] [address] [place], [place] [address]    Report Status 12/30/2017 FINAL  Final         Radiology Studies: No results found.   Scheduled Meds: . acetaminophen  650 mg Oral Q4H  . calcium carbonate  1 tablet Oral Q breakfast  . cholecalciferol  1,000 Units Oral Daily  . dexamethasone  4 mg Intravenous Q8H  . docusate sodium  100 mg Oral BID  . feeding supplement (PRO-STAT SUGAR FREE 64)  30 mL Oral BID  . irbesartan  300 mg Oral Daily  . magnesium oxide  400 mg Oral BID  . mouth rinse  15 mL Mouth Rinse BID  . metoprolol tartrate  50 mg Oral BID  . modafinil  100 mg Oral Daily  . pantoprazole  40 mg Oral Daily  . polyethylene glycol  17 g Oral Daily  . potassium chloride  30 mEq Oral BID  . ranitidine  150 mg Oral BID  . tamsulosin  0.4 mg Oral Daily    Continuous Infusions:    LOS: 9 days      Bonnielee Haff, MD [facility] Pager [phone]  If 7PM-7AM, please contact night-coverage [email or web address] Password [email or web address] 01/01/2018, 2:50 PM

## 2018-01-01 NOTE — Plan of Care (Signed)
  Problem: RH BOWEL ELIMINATION Goal: RH STG MANAGE BOWEL WITH ASSISTANCE Description STG Manage Bowel with Assistance. Mod  Outcome: Progressing Goal: RH STG MANAGE BOWEL W/MEDICATION W/ASSISTANCE Description STG Manage Bowel with Medication with Assistance. mod  Outcome: Progressing   Problem: RH BLADDER ELIMINATION Goal: RH STG MANAGE BLADDER WITH ASSISTANCE Description STG Manage Bladder With mod Assistance   Outcome: Progressing   Problem: RH SKIN INTEGRITY Goal: RH STG MAINTAIN SKIN INTEGRITY WITH ASSISTANCE Description STG Maintain Skin Integrity With mod Assistance.  Outcome: Progressing   Problem: RH SAFETY Goal: RH STG ADHERE TO SAFETY PRECAUTIONS W/ASSISTANCE/DEVICE Description STG Adhere to Safety Precautions With Assistance/Device. Mod  Outcome: Progressing   Problem: RH COGNITION-NURSING Goal: RH STG USES MEMORY AIDS/STRATEGIES W/ASSIST TO PROBLEM SOLVE Description STG Uses Memory Aids/Strategies With Assistance to Problem Solve. Mod  Outcome: Progressing   Problem: RH PAIN MANAGEMENT Goal: RH STG PAIN MANAGED AT OR BELOW PT'S PAIN GOAL Description Less than 5  Outcome: Progressing   Problem: RH SKIN INTEGRITY Goal: RH STG MAINTAIN SKIN INTEGRITY WITH ASSISTANCE Description STG Maintain Skin Integrity With mod Assistance.  01/01/2018 1254 by Gerald Stabs, LPN Outcome: Not Progressing 01/01/2018 1253 by Gerald Stabs, LPN Outcome: Progressing

## 2018-01-01 NOTE — Progress Notes (Signed)
[facility] PHYSICAL MEDICINE & REHABILITATION PROGRESS NOTE  Subjective/Complaints: Patient seen sitting up in bed eating breakfast with nurse tech this morning.  Upon entering patient closed his eyes and engages minimally.  Per nurse tech patient awake, alert, and freely conversing prior to arrival.  No reported issues overnight.  ROS: Appears to deny CP, SOB, N/V/D.  Objective: Vital Signs: Blood pressure (!) 159/99, pulse 72, temperature 98.6 F (37 C), temperature source Oral, resp. rate 18, height 5\' 5"  (1.651 m), weight 84.1 kg, SpO2 98 %. No results found. Recent Labs    12/30/17 0503  WBC 14.8*  HGB 11.9*  HCT 35.9*  PLT 311   Recent Labs    12/30/17 0503 01/01/18 0455  NA 131* 129*  K 3.7 3.6  CL 100 95*  CO2 25 22  GLUCOSE 150* 143*  BUN 17 18  CREATININE 0.68 0.64  CALCIUM 9.0 9.0    Physical Exam: BP (!) 159/99 (BP Location: Left Arm)   Pulse 72   Temp 98.6 F (37 C) (Oral)   Resp 18   Ht 5\' 5"  (1.651 m)   Wt 84.1 kg   SpO2 98%   BMI 30.85 kg/m  Constitutional: No distress . Vital signs reviewed. HENT: Normocephalic.  Atraumatic. Eyes: Keeps eyes closed. No discharge. Cardiovascular: Irregularly irregular.  No JVD. Respiratory: CTA bilaterally.  Normal effort. GI: BS +. Non-distended. Musc: No edema or tenderness in extremities. Musculoskeletal: Generalized edema Neurological:  Lethargic/keeps eyes closed Right gaze preference Mild dysarthria Motor: Limited due to participation Previously:  RUE: 4/5 proximal to distal   RLE: 3/5 proximal to distal (?effort)   LUE: 4/5 proximal to distal (?effort)   LLE: 2-/5 proximal to distal   (? effort) Skin: Skin is warm and dry.  Scattered ecchymotic areas.   Psychiatric: Unable to assess due to mentation   Assessment/Plan: 1. Functional deficits secondary to right intraventricular hemorrhage which require 3+ hours per day of interdisciplinary therapy in a comprehensive inpatient rehab  setting.  Physiatrist is providing close team supervision and 24 hour management of active medical problems listed below.  Physiatrist and rehab team continue to assess barriers to discharge/monitor patient progress toward functional and medical goals  Care Tool:  Bathing    Body parts bathed by patient: Right arm, Left arm, Abdomen, Right upper leg, Left upper leg, Face, Chest   Body parts bathed by helper: Right lower leg, Left lower leg, Buttocks, Front perineal area     Bathing assist Assist Level: Maximal Assistance - Patient 24 - 49%     Upper Body Dressing/Undressing Upper body dressing   What is the patient wearing?: Pull over shirt    Upper body assist Assist Level: Maximal Assistance - Patient 25 - 49%    Lower Body Dressing/Undressing Lower body dressing      What is the patient wearing?: Pants     Lower body assist Assist for lower body dressing: 2 Helpers     Toileting Toileting    Toileting assist Assist for toileting: 2 Helpers     Transfers Chair/bed transfer  Transfers assist  Chair/bed transfer activity did not occur: Safety/medical concerns  Chair/bed transfer assist level: 2 Helpers     Locomotion Ambulation   Ambulation assist   Ambulation activity did not occur: Safety/medical concerns          Walk 10 feet activity   Assist  Walk 10 feet activity did not occur: Safety/medical concerns  Walk 50 feet activity   Assist Walk 50 feet with 2 turns activity did not occur: Safety/medical concerns         Walk 150 feet activity   Assist Walk 150 feet activity did not occur: Safety/medical concerns         Walk 10 feet on uneven surface  activity   Assist Walk 10 feet on uneven surfaces activity did not occur: Safety/medical concerns         Wheelchair     Assist     Wheelchair activity did not occur: Safety/medical concerns         Wheelchair 50 feet with 2 turns  activity    Assist    Wheelchair 50 feet with 2 turns activity did not occur: Safety/medical concerns       Wheelchair 150 feet activity     Assist Wheelchair 150 feet activity did not occur: Safety/medical concerns          Medical Problem List and Plan: 1.  Visual field deficits with gaze preference, visual hallucinations, difficulty keeping eyes open,  pusher tendencies and left bias with standing secondary to large intraventricular hemorrhage   Cont CIR  Repeat head CT reviewed, with increase in edema, discussed with neurosurgery previously, no changes in management. Repeat head CT reviewed, stable.  Per Neurosurg, no intervention planned unless worsening CT.  Please see PA note.   Appreciate IM recs  IV steroids started on 11/11, increased on 11/12, decreased to 4 mg every 8 hours on 11/15  Appreciate palliative care consult  Appears to have behavioral component at play with varying degrees of alertness as well as orientation and interactiveness at different times. 2.  DVT Prophylaxis/Anticoagulation: Mechanical: Sequential compression devices, below knee Bilateral lower extremities 3. Headaches/Pain Management: Limit sedating medications. Limit hydrocodone as needed for severe HA.   Topamax DC'd 4. Mood: LCSW to follow for evaluation and support when appropriate. Resumed home ativan prn at lower dose.   Provigil started on 11/14 5. Neuropsych: This patient is not capable of making decisions on her own behalf 6. Skin/Wound Care: Routine pressure relief measures 7. Fluids/Electrolytes/Nutrition: Monitor I's and O's.    D2 thins, advance diet as tolerated  Intake variable 8.  E. coli UTI: Completed 7 days of ctx/amoxicillin  Foley DC'd on 11/11 9.  HTN: Blood pressure goals less than 160/90.  Continue me Solu overhear swelling over here toprolol twice daily and increased Avapro to 300 mg on 11/13.   Labetalol as needed started on 11/14  Metoprolol increased to 50  twice daily on 11/14  Elevated on 11/15, likely exacerbated by steroids 10.  Seizure prophylaxis: Continue Keppra twice daily, DC'd on 11/12. 11.  Urinary retention: Discontinued Ditropan.  Continue Flomax routine pressure relief measures. 12.  Leukocytosis:   WBCs 14.3 on 11/13, confounded by steroids  Labs ordered for Monday  ABX per IM DC'd on 11/11  Continue to monitor 13. Hyponatremia:   Sodium 129 on 11/15  Labs ordered for Monday  IVF DC'd by IM, restarted on 11/12, d/ced on 11/13  Continue to monitor 14. H/o Barrett's esophagus/GERD: On Protonix 15.  Hypokalemia  Potassium 3.6 on 11/15  Labs ordered for Monday  Supplement increased on 11/1 16.  Hypoalbuminemia  Supplement initiated on 11/7 17.  Acute blood loss anemia  Hemoglobin 10.8 on 11/11 18.  Fevers and lethargy: Fevers resolved, lethargy persists -?  Behavioral, see above  Appreciate IM recs  Antibiotics DC'd 19.  Hypomagnesemia  Magnesium  1.7 on 11/13  Continue supplementation  20. Steroid induced hyperglycemia   Cont to monitor  LOS: 9 days A FACE TO FACE EVALUATION WAS PERFORMED  Emanuel Campos [name] 01/01/2018, 10:24 AM

## 2018-01-01 NOTE — Progress Notes (Signed)
Speech Language Pathology Daily Session Note  Patient Details  Name: [name] MRN: [id number] Date of Birth: 05-11-1926  Today's Date: 01/01/2018 SLP Individual Time: 1300-1355 SLP Individual Time Calculation (min): 55 min  Short Term Goals: Week 2: SLP Short Term Goal 1 (Week 2): Pt will consume trials of dys 3 textures and thin liquids with min cues for use of swallowing precautions and minimal overt s/s of aspiration over three consecutive sessions prior to advancement.   SLP Short Term Goal 2 (Week 2): Pt will sustain her attention to basic, functional tasks for 5 minutes with mod cues for redirection.   SLP Short Term Goal 3 (Week 2): Pt will complete basic, familiar tasks with mod assist for functional problem solving.   SLP Short Term Goal 4 (Week 2): Pt will locate items to the left of midline in ~50% of opportunities during basic, familiar tasks with mod assist.    Skilled Therapeutic Interventions:  Pt was seen for skilled ST targeting cognitive and dysphagia goals.  Pt was upright in tilt in space wheelchair awake, drowsy but responsive, and beginning to eat lunch with daughter present upon therapist's arrival.   Daughter excused herself to check on her husband who is admitted on another unit.  Pt needed max to total hand over hand assist cues to locate food items slightly to the left of midline.  She was encouraged to feed herself but needed max cues to do so due to decreased attention to task, slowed processing, and visual deficits.  Pt consumed dys 2 textures and thin liquids with min-mod cues for use of swallowing precautions and no overt s/s of aspiration.  Mastication of solids remains slowed but effective.  Pt was left in wheelchair with chair alarm set and daughter returned to bedside as therapist was departing.  Continue per current plan of care.    Pain Pain Assessment Pain Scale: 0-10 Pain Score: 0-No pain  Therapy/Group: Individual Therapy  Kaitland Lewellyn, Selinda Orion 01/01/2018, 3:06 PM

## 2018-01-01 NOTE — Progress Notes (Signed)
Daily Progress Note   Patient Name: [name]       Date: 01/01/2018 DOB: 05-Aug-1926  Age: 84 y.o. MRN#: [id number] Attending Physician: Jamse Arn, MD Primary Care Physician: Merrilee Seashore, MD Admit Date: 12/23/2017  Reason for Consultation/Follow-up: Establishing goals of care  Subjective: Patient is resting in bed with daughter at bedside. Daughter has found her mother's living will and states her mother does have one, but she did not read it, she only read that she was health care POA. Patient alert and verbal. Thought content organized today. She was able to re-confirm the decisions previously made, and MOST form completed this hospitalization.   Cardiopulmonary Resuscitation: Do Not Attempt Resuscitation (DNR/No CPR)  Medical Interventions: Limited Additional Interventions: Use medical treatment, IV fluids and cardiac monitoring as indicated, DO NOT USE intubation or mechanical ventilation. May consider use of less invasive airway support such as BiPAP or CPAP. Also provide comfort measures. Transfer to the hospital if indicated. Avoid intensive care.   Antibiotics: Antibiotics if indicated  IV Fluids: IV fluids if indicated  Feeding Tube: No feeding tube     Length of Stay: 9  Current Medications: Scheduled Meds:  . acetaminophen  650 mg Oral Q4H  . calcium carbonate  1 tablet Oral Q breakfast  . cholecalciferol  1,000 Units Oral Daily  . dexamethasone  4 mg Intravenous Q8H  . docusate sodium  100 mg Oral BID  . feeding supplement (PRO-STAT SUGAR FREE 64)  30 mL Oral BID  . irbesartan  300 mg Oral Daily  . magnesium oxide  400 mg Oral BID  . mouth rinse  15 mL Mouth Rinse BID  . metoprolol tartrate  50 mg Oral BID  . modafinil  100 mg Oral Daily  .  pantoprazole  40 mg Oral Daily  . polyethylene glycol  17 g Oral Daily  . potassium chloride  30 mEq Oral BID  . ranitidine  150 mg Oral BID  . tamsulosin  0.4 mg Oral Daily    Continuous Infusions:   PRN Meds: acetaminophen, alum & mag hydroxide-simeth, bisacodyl, cloNIDine, guaiFENesin-dextromethorphan, labetalol, lidocaine, nitroGLYCERIN, ondansetron (ZOFRAN) IV, polyethylene glycol, prochlorperazine **OR** prochlorperazine **OR** prochlorperazine, simethicone, sodium phosphate  Physical Exam  Constitutional: No distress.  Pulmonary/Chest: Effort normal.  Neurological: She is alert.  Skin: Skin is  warm and dry.            Vital Signs: BP (!) 159/99 (BP Location: Left Arm)   Pulse 72   Temp 98.6 F (37 C) (Oral)   Resp 18   Ht 5\' 5"  (1.651 m)   Wt 84.1 kg   SpO2 98%   BMI 30.85 kg/m  SpO2: SpO2: 98 % O2 Device: O2 Device: Room Air O2 Flow Rate:    Intake/output summary:   Intake/Output Summary (Last 24 hours) at 01/01/2018 1153 Last data filed at 12/31/2017 1900 Gross per 24 hour  Intake 480 ml  Output 1100 ml  Net -620 ml   LBM: Last BM Date: 01/01/18 Baseline Weight: Weight: 84.1 kg Most recent weight: Weight: 84.1 kg       Palliative Assessment/Data: 40%    Flowsheet Rows     Most Recent Value  Intake Tab  Referral Department  -- [Rehab]  Unit at Time of Referral  Other (Comment)  Palliative Care Primary Diagnosis  Neurology  Date Notified  12/29/17  Palliative Care Type  New Palliative care  Reason for referral  Clarify Goals of Care, Counsel Regarding Hospice  Date of Admission  12/23/17  Date first seen by Palliative Care  12/29/17  # of days Palliative referral response time  0 Day(s)  # of days IP prior to Palliative referral  6  Clinical Assessment  Psychosocial & Spiritual Assessment  Palliative Care Outcomes      Patient Active Problem List   Diagnosis Date Noted  . Steroid-induced hyperglycemia   . Benign essential HTN   .  Labile blood pressure   . Hypertensive crisis   . Cerebral edema (HCC)   . Hypomagnesemia   . Dysphagia, post-stroke   . Lethargy   . Acute blood loss anemia   . Hypoalbuminemia due to protein-calorie malnutrition ([place])   . ICH (intracerebral hemorrhage) ([place]) 12/23/2017  . History of Barrett's esophagus   . Urinary retention   . Seizure prophylaxis   . E. coli UTI   . Intraventricular hemorrhage ([place])   . Urinary tract infection without hematuria   . Coronary artery disease involving native coronary artery of native heart without angina pectoris   . Pulmonary HTN ([place])   . Leukocytosis   . Intracranial hemorrhage ([place]) 12/18/2017  . Syncope 04/10/2015  . Sepsis secondary to UTI ([place]) 04/10/2015  . URI (upper respiratory infection) 04/10/2015  . UTI (lower urinary tract infection) 04/10/2015  . QT prolongation 10/12/2014  . Headache 09/02/2012  . Tricuspid valve regurgitation, moderate 08/18/2012  . Hypokalemia 08/16/2012  . Hyponatremia 08/16/2012  . Atrial fibrillation ([place])   . Hypertension   . Anxiety   . Mixed hyperlipidemia 09/11/2009  . GERD 08/30/2008  . CHEST PAIN 08/30/2008  . Coronary atherosclerosis 04/04/2008  . Syncope and collapse 04/04/2008    Palliative Care Assessment & Plan    Recommendations/Plan:  Recommend palliative at D/C.   Will shadow for decline.     Code Status:    Code Status Orders  (From admission, onward)         Start     Ordered   12/23/17 1656  Do not attempt resuscitation (DNR)  Continuous    Question Answer Comment  In the event of cardiac or respiratory ARREST Do not call a "code blue"   In the event of cardiac or respiratory ARREST Do not perform Intubation, CPR, defibrillation or ACLS   In the event of cardiac or respiratory  ARREST Use medication by any route, position, wound care, and other measures to relive pain and suffering. May use oxygen, suction and manual treatment of airway obstruction as needed for  comfort.      12/23/17 1655        Code Status History    Date Active Date Inactive Code Status Order ID Comments User Context   12/23/2017 1655 12/23/2017 1655 DNR [id number]  Flora Lipps Inpatient   12/18/2017 1800 12/23/2017 1620 DNR [id number]  Traci Sermon, PA-C ED   04/10/2015 2047 04/13/2015 1542 Full Code [id number]  Etta Quill, DO ED   10/12/2014 1806 10/13/2014 1632 Full Code [id number]  Samella Parr, NP Inpatient    Advance Directive Documentation     Most Recent Value  Type of Advance Directive  Healthcare Power of Attorney, Living will  Pre-existing out of facility DNR order (yellow form or pink MOST form)  -  "MOST" Form in Place?  -       Prognosis:   Unable to determine  Discharge Planning:  [place] for rehab with Palliative care service follow-up   Thank you for allowing the Palliative Medicine Team to assist in the care of this patient.   Total Time 35 min Prolonged Time Billed no      Greater than 50%  of this time was spent counseling and coordinating care related to the above assessment and plan.  Asencion Gowda, NP  Please contact Palliative Medicine Team phone at [phone] for questions and concerns.

## 2018-01-02 ENCOUNTER — Inpatient Hospital Stay (HOSPITAL_COMMUNITY): Payer: Medicare Other | Admitting: Physical Therapy

## 2018-01-02 NOTE — Progress Notes (Signed)
Patient ID: [name], female   DOB: [birth year], 84 y.o.   MRN: [id number]  PROGRESS NOTE    [name]  EOF:[id number] DOB: 20-Jun-1926 DOA: 12/23/2017 PCP: Merrilee Seashore, MD    Brief Narrative:  83 y.o. female with history of dementia, chronic atrial fibrillation on Xarelto until recently, pulmonary hypertension, Barrett's esophagus who was recently admitted on 12/18/2017 patient for a large intraventricular hemorrhage which was treated conservatively along with E. coli UTI which was initially treated with Rocephin and switched to Keflex and discharged to inpatient rehab on 12/23/2017.  Hospitalist service was consulted on 12/25/2017 for fever and increased lethargy.  Patient was started on broad-spectrum antibiotics on 12/25/2017.  Antibiotics were de-escalated and stopped on 11/11.  Keppra and Topamax was also discontinued.  CT scan showed more or less stable findings with perhaps mild midline shift.  Patient was started on dexamethasone.   Assessment & Plan:   Acute metabolic encephalopathy with fever Etiology for her fever was not clear.  No infectious source has been found.  UA was unremarkable.  Chest x-ray did not show any infiltrates.  Cultures without any growth.  Patient was initially started on vancomycin and Zosyn.  Both of these have been discontinued.  Patient was recently treated with ceftriaxone for E. coli UTI.  Urine culture subsequently have been without any growth.  She has not had any further episodes of fever. -CT of the head on 12/25/2017 showed no new bleeding but increased vasogenic edema in the right temporal lobe.  No plans for surgical intervention.  Patient was started on dexamethasone.  She was also taken off of Topamax and Keppra as these medications can cause lethargy and drowsiness.  Mental status significantly improved.  Leave her off of Keppra and Topamax for now.  Keppra was initiated as a seizure prophylaxis due to her intracranial hemorrhage.  She  never had any seizure activity.  Headache most likely due to her intracranial process.  Being treated symptomatically. -Discussed with neurosurgery, Mr. Cato Mulligan, on 11/14.  Okay to leave her off of [place].  They recommend tapering down dexamethasone over the next few days.  May have to increase the dose if her mentation gets worse while she is being tapered down.  Will recommend doing that over the next 6 to 8 days while monitoring her mental status.  Recent intraventricular hemorrhage -Conservatively treated.    See discussion above.  Patient started on dexamethasone due to presence of midline shift.  Patient remains stable to improved.    Leukocytosis -Mild. Elevated WBC is most likely due to steroids.  Recent E. coli UTI -Status post completion of antibiotic treatment.  E. coli was pansensitive.  Urine culture from 11/8 without any growth.  Essential hypertension Blood pressure was noted to be elevated.  Dose of metoprolol was increased to 50 mg twice a day.  It looks like the dose of the ARB has been increased to 300 mg.  Continue to monitor blood pressures.  Will not make any further changes to her antihypertensive regimen.  Elevated blood pressure could also be due to steroids and if that is the case BP should improve as steroid is tapered down.    Chronic atrial fibrillation -Rate controlled.  Continue metoprolol.  Off Xarelto since recent intraventricular hemorrhage  Hypokalemia -Replaced.  Improved  Hyponatremia -Noted to be low but stable.  This could be due to an element of SIADH considering her intracranial hemorrhage.  Generalized deconditioning -Patient's mentation has improved although she remains  weak.  Palliative medicine is following.  Patient remains stable.  Mental status has been stable for the last few days.  See above for recommendations regarding dexamethasone .  TR[facility] will sign off today.  Please call us back if there are any questions.  Antimicrobials:    Vancomycin 12/25/2017-12/27/2017 Zosyn from 12/25/2017-12/28/2017 Patient was on Rocephin from 12/18/2017-12/23/2017 and subsequently on Keflex from 12/23/2017-12/24/2017  Subjective: Patient denies any complaints.  However her headache does persist.  She was given pain medications this morning.  Objective: Vitals:   01/01/18 1715 01/01/18 1958 01/02/18 0406 01/02/18 0625  BP:  (!) 154/87 (!) 186/99 (!) 105/56  Pulse:  77 (!) 59   Resp:  14 14   Temp: 98.3 F (36.8 C) 97.6 F (36.4 C) 97.9 F (36.6 C)   TempSrc: Oral Oral Oral   SpO2:  97% 100%   Weight:      Height:        Intake/Output Summary (Last 24 hours) at 01/02/2018 1013 Last data filed at 01/02/2018 0500 Gross per 24 hour  Intake 120 ml  Output 600 ml  Net -480 ml   Filed Weights   12/23/17 1624  Weight: 84.1 kg    Examination:  General exam: She is awake alert.  In no distress. Respiratory system: Normal effort at rest.  Clear to auscultation bilaterally Cardiovascular system: S1-S2 is normal regular.  No S3-S4   Data Reviewed: I have personally reviewed following labs and imaging studies  CBC: Recent Labs  Lab 12/27/17 0620 12/28/17 0607 12/30/17 0503  WBC 10.5 11.7* 14.8*  NEUTROABS 6.6  --  12.5*  HGB 10.5* 10.8* 11.9*  HCT 33.2* 33.3* 35.9*  MCV 89.7 88.3 88.6  PLT 154 237 341   Basic Metabolic Panel: Recent Labs  Lab 12/27/17 0620 12/28/17 0607 12/30/17 0503 01/01/18 0455  NA 133* 130* 131* 129*  K 3.7 3.5 3.7 3.6  CL 103 99 100 95*  CO2 23 23 25 22   GLUCOSE 132* 120* 150* 143*  BUN 13 11 17 18   CREATININE 0.81 0.65 0.68 0.64  CALCIUM 8.3* 8.5* 9.0 9.0  MG 1.5* 1.4* 1.7  --    GFR: Estimated Creatinine Clearance: 51 mL/min (by C-G formula based on SCr of 0.64 mg/dL). Liver Function Tests: Recent Labs  Lab 12/27/17 0620  AST 31  ALT 28  ALKPHOS 64  BILITOT 1.1  PROT 5.8*  ALBUMIN 2.4*     Recent Results (from the past 240 hour(s))  Culture, Urine     Status: None    Collection Time: 12/25/17  2:03 PM  Result Value Ref Range Status   Specimen Description URINE, CATHETERIZED  Final   Special Requests NONE  Final   Culture   Final    NO GROWTH Performed at [facility], [place] [address] [place], [place] [address]    Report Status 12/26/2017 FINAL  Final  Culture, blood (routine x 2)     Status: None   Collection Time: 12/25/17  2:36 PM  Result Value Ref Range Status   Specimen Description BLOOD LEFT HAND  Final   Special Requests   Final    BOTTLES DRAWN AEROBIC ONLY Blood Culture adequate volume   Culture   Final    NO GROWTH 5 DAYS Performed at [facility], [place] [address], [place], [place] [address]    Report Status 12/30/2017 FINAL  Final  Culture, blood (routine x 2)     Status: None  Collection Time: 12/25/17  2:40 PM  Result Value Ref Range Status   Specimen Description BLOOD RIGHT HAND  Final   Special Requests   Final    BOTTLES DRAWN AEROBIC ONLY Blood Culture adequate volume   Culture   Final    NO GROWTH 5 DAYS Performed at [facility], [address], [place], [place] [address]    Report Status 12/30/2017 FINAL  Final         Radiology Studies: No results found.   Scheduled Meds: . acetaminophen  650 mg Oral Q4H  . calcium carbonate  1 tablet Oral Q breakfast  . cholecalciferol  1,000 Units Oral Daily  . dexamethasone  4 mg Intravenous Q8H  . docusate sodium  100 mg Oral BID  . feeding supplement (PRO-STAT SUGAR FREE 64)  30 mL Oral BID  . irbesartan  300 mg Oral Daily  . magnesium oxide  400 mg Oral BID  . mouth rinse  15 mL Mouth Rinse BID  . metoprolol tartrate  50 mg Oral BID  . modafinil  100 mg Oral Daily  . pantoprazole  40 mg Oral Daily  . polyethylene glycol  17 g Oral Daily  . potassium chloride  30 mEq Oral BID  . ranitidine  150 mg Oral BID  . tamsulosin  0.4 mg Oral Daily   Continuous Infusions:    LOS: 10 days     Bonnielee Haff, MD [facility] Pager  [phone]  If 7PM-7AM, please contact night-coverage [email or web address] Password [email or web address] 01/02/2018, 10:13 AM

## 2018-01-02 NOTE — Progress Notes (Signed)
Physical Therapy Session Note  Patient Details  Name: [name] MRN: [id number] Date of Birth: 1926/04/04  Today's Date: 01/02/2018 PT Individual Time: 1010-1035 PT Individual Time Calculation (min): 25 min   Short Term Goals: Week 1:  PT Short Term Goal 1 (Week 1): Pt will perform bed mobility with max assist of 1 consistently  PT Short Term Goal 1 - Progress (Week 1): Progressing toward goal PT Short Term Goal 2 (Week 1): Pt will sit EOB x 3 min with mod assist  PT Short Term Goal 2 - Progress (Week 1): Not met PT Short Term Goal 3 (Week 1): Pt will perform bed<>WC transfers with max assist  PT Short Term Goal 3 - Progress (Week 1): Not met PT Short Term Goal 4 (Week 1): Pt will tolerate sitting in WC for up to 2 hours to prepare for out of bed mobility  PT Short Term Goal 4 - Progress (Week 1): Not met  Skilled Therapeutic Interventions/Progress Updates:   Pt received supine in bed, but pt noted to have increased lethargy compared to yesterday. Significant effort to arouse pt, and then she was agreeable to attempt to get out of bed. Supine>sit transfer with total assist from PT. Sitting balance EOB withtotal assist. PT required to hold pt's trunk and head in upright posture. No attempt to initiate corrective movements. stedy transfer to [facility] with total +2 assist from PT and tech. Pt transported to [facility] in stedy. And left semi reclined in WC. Throughout treatment, pt noted to only open eyes for up to 10 seconds and reports stomach pain.      Therapy Documentation Precautions:  Precautions Precautions: Fall Precaution Comments: pushing left, decreased attention, left hemiparesis Restrictions Weight Bearing Restrictions: No Vital Signs: Therapy Vitals Temp: 98.4 F (36.9 C) Temp Source: Oral Pulse Rate: 67 Resp: 17 BP: (!) 154/74 Patient Position (if appropriate): Lying Oxygen Therapy SpO2: 98 % O2 Device: Room Air Pain: 8/10 stomach   Therapy/Group: Individual  Therapy  [name] 01/02/2018, 7:39 PM

## 2018-01-02 NOTE — Progress Notes (Signed)
PHYSICAL MEDICINE & REHABILITATION PROGRESS NOTE  Subjective/Complaints: Patient seen sitting up in bed sleeping this morning.  She is difficult to arouse.  ROS: Unable to obtain due to cognition  Objective: Vital Signs: Blood pressure (!) 163/94, pulse (!) 59, temperature 97.9 F (36.6 C), temperature source Oral, resp. rate 14, height 5\' 5"  (1.651 m), weight 84.1 kg, SpO2 100 %. No results found. No results for input(s): WBC, HGB, HCT, PLT in the last 72 hours. Recent Labs    01/01/18 0455  NA 129*  K 3.6  CL 95*  CO2 22  GLUCOSE 143*  BUN 18  CREATININE 0.64  CALCIUM 9.0    Physical Exam: BP (!) 163/94 (BP Location: Right Arm)   Pulse (!) 59 Comment: rn notified  Temp 97.9 F (36.6 C) (Oral)   Resp 14   Ht 5\' 5"  (1.651 m)   Wt 84.1 kg   SpO2 100%   BMI 30.85 kg/m  Constitutional: No distress . Vital signs reviewed. HENT: Normocephalic.  Atraumatic. Eyes: Keeps eyes closed.  No discharge. Cardiovascular: Irregularly irregular.  No JVD. Respiratory: CTA bilaterally.  Normal effort. GI: BS +. Non-distended. Musc: No edema or tenderness in extremities. Musculoskeletal: Generalized edema Neurological:  Lethargic/keeps eyes closed Right gaze preference Mild dysarthria Motor: Limited due to participation Previously:  RUE: 4/5 proximal to distal   RLE: 3/5 proximal to distal (?effort)   LUE: 4/5 proximal to distal (?effort)   LLE: 2-/5 proximal to distal   (? effort) Skin: Skin is warm and dry.  Scattered ecchymotic areas.   Psychiatric: Unable to assess due to mentation   Assessment/Plan: 1. Functional deficits secondary to right intraventricular hemorrhage which require 3+ hours per day of interdisciplinary therapy in a comprehensive inpatient rehab setting.  Physiatrist is providing close team supervision and 24 hour management of active medical problems listed below.  Physiatrist and rehab team continue to assess barriers to discharge/monitor  patient progress toward functional and medical goals  Care Tool:  Bathing    Body parts bathed by patient: Right arm, Left arm, Abdomen, Face, Chest, Front perineal area   Body parts bathed by helper: Buttocks, Left lower leg, Right lower leg, Left upper leg, Right upper leg     Bathing assist Assist Level: Maximal Assistance - Patient 24 - 49%     Upper Body Dressing/Undressing Upper body dressing   What is the patient wearing?: Pull over shirt    Upper body assist Assist Level: Maximal Assistance - Patient 25 - 49%    Lower Body Dressing/Undressing Lower body dressing      What is the patient wearing?: Pants     Lower body assist Assist for lower body dressing: 2 Helpers     Toileting Toileting    Toileting assist Assist for toileting: 2 Helpers     Transfers Chair/bed transfer  Transfers assist  Chair/bed transfer activity did not occur: Safety/medical concerns  Chair/bed transfer assist level: 2 Helpers     Locomotion Ambulation   Ambulation assist   Ambulation activity did not occur: Safety/medical concerns          Walk 10 feet activity   Assist  Walk 10 feet activity did not occur: Safety/medical concerns        Walk 50 feet activity   Assist Walk 50 feet with 2 turns activity did not occur: Safety/medical concerns         Walk 150 feet activity   Assist Walk 150 feet activity did  not occur: Safety/medical concerns         Walk 10 feet on uneven surface  activity   Assist Walk 10 feet on uneven surfaces activity did not occur: Safety/medical Armed forces technical officer activity did not occur: Safety/medical concerns         Wheelchair 50 feet with 2 turns activity    Assist    Wheelchair 50 feet with 2 turns activity did not occur: Safety/medical concerns       Wheelchair 150 feet activity     Assist Wheelchair 150 feet activity did not occur: Safety/medical  concerns          Medical Problem List and Plan: 1.  Visual field deficits with gaze preference, visual hallucinations, difficulty keeping eyes open,  pusher tendencies and left bias with standing secondary to large intraventricular hemorrhage   Cont CIR  Repeat head CT reviewed, with increase in edema, discussed with neurosurgery previously, no changes in management. Repeat head CT reviewed, stable.  Per Neurosurg, no intervention planned unless worsening CT.  Please see PA note.   Appreciate IM recs  IV steroids started on 11/11, increased on 11/12, decreased to 4 mg every 8 hours on 11/15, will monitor for now may need to increase dose again  Appreciate palliative care consult  Appears to have behavioral component at play with varying degrees of alertness as well as orientation and interactiveness at different times. 2.  DVT Prophylaxis/Anticoagulation: Mechanical: Sequential compression devices, below knee Bilateral lower extremities 3. Headaches/Pain Management: Limit sedating medications. Limit hydrocodone as needed for severe HA.   Topamax DC'd 4. Mood: LCSW to follow for evaluation and support when appropriate. Resumed home ativan prn at lower dose.   Provigil started on 11/14 5. Neuropsych: This patient is not capable of making decisions on her own behalf 6. Skin/Wound Care: Routine pressure relief measures 7. Fluids/Electrolytes/Nutrition: Monitor I's and O's.    D2 thins, advance diet as tolerated  Intake variable 8.  E. coli UTI: Completed 7 days of ctx/amoxicillin  Foley DC'd on 11/11 9.  HTN: Blood pressure goals less than 160/90.  Continue me Solu overhear swelling over here toprolol twice daily and increased Avapro to 300 mg on 11/13.   Labetalol as needed started on 11/14  Metoprolol increased to 50 twice daily on 11/14  Labile with hypertensive crisis this a.m. 10.  Seizure prophylaxis: Continue Keppra twice daily, DC'd on 11/12. 11.  Urinary retention: Discontinued  Ditropan.  Continue Flomax routine pressure relief measures. 12.  Leukocytosis:   WBCs 14.3 on 11/13, confounded by steroids  Labs ordered for Monday  ABX per IM DC'd on 11/11  Continue to monitor 13. Hyponatremia:   Sodium 129 on 11/15  Labs ordered for Monday  IVF DC'd by IM, restarted on 11/12, d/ced on 11/13  Continue to monitor 14. H/o Barrett's esophagus/GERD: On Protonix 15.  Hypokalemia  Potassium 3.6 on 11/15  Labs ordered for Monday  Supplement increased on 11/1 16.  Hypoalbuminemia  Supplement initiated on 11/7 17.  Acute blood loss anemia  Hemoglobin 10.8 on 11/11 18.  Fevers and lethargy: Fevers resolved, lethargy persists -?  Behavioral, see above  Appreciate IM recs  Antibiotics DC'd 19.  Hypomagnesemia  Magnesium 1.7 on 11/13  Continue supplementation  20. Steroid induced hyperglycemia   Cont to monitor  LOS: 10 days A FACE TO FACE EVALUATION WAS PERFORMED  Ankit Maurene Capes  [place] 01/02/2018, 2:32 PM

## 2018-01-02 NOTE — Patient Care Conference (Signed)
Inpatient RehabilitationTeam Conference and Plan of Care Update Date: 12/30/2017   Time: 11:45 AM    Patient Name: [name]      Medical Record Number: [id number]  Date of Birth: 1926-08-04 Sex: Female         Room/Bed: 4M12C/4M12C-01 Payor Info: Payor: MEDICARE / Plan: MEDICARE PART A AND B / Product Type: *No Product type* /    Admitting Diagnosis: R IVH  Admit Date/Time:  12/23/2017  4:20 PM Admission Comments: No comment available   Primary Diagnosis:  <principal problem not specified> Principal Problem: <principal problem not specified>  Patient Active Problem List   Diagnosis Date Noted  . Steroid-induced hyperglycemia   . Benign essential HTN   . Labile blood pressure   . Hypertensive crisis   . Cerebral edema (HCC)   . Hypomagnesemia   . Dysphagia, post-stroke   . Lethargy   . Acute blood loss anemia   . Hypoalbuminemia due to protein-calorie malnutrition ([place])   . ICH (intracerebral hemorrhage) ([place]) 12/23/2017  . History of Barrett's esophagus   . Urinary retention   . Seizure prophylaxis   . E. coli UTI   . Intraventricular hemorrhage ([place])   . Urinary tract infection without hematuria   . Coronary artery disease involving native coronary artery of native heart without angina pectoris   . Pulmonary HTN ([place])   . Leukocytosis   . Intracranial hemorrhage ([place]) 12/18/2017  . Syncope 04/10/2015  . Sepsis secondary to UTI ([place]) 04/10/2015  . URI (upper respiratory infection) 04/10/2015  . UTI (lower urinary tract infection) 04/10/2015  . QT prolongation 10/12/2014  . Headache 09/02/2012  . Tricuspid valve regurgitation, moderate 08/18/2012  . Hypokalemia 08/16/2012  . Hyponatremia 08/16/2012  . Atrial fibrillation ([place])   . Hypertension   . Anxiety   . Mixed hyperlipidemia 09/11/2009  . GERD 08/30/2008  . CHEST PAIN 08/30/2008  . Coronary atherosclerosis 04/04/2008  . Syncope and collapse 04/04/2008    Expected Discharge Date: Expected Discharge  Date: (3-4 weeks vs. SNF)  Team Members Present: Physician leading conference: Dr. Delice Lesch Social Worker Present: Alfonse Alpers, LCSW Nurse Present: Rayetta Pigg, RN PT Present: Barrie Folk, PT;Rosita Dechalus, PTA OT Present: Clyda Greener, OT SLP Present: Windell Moulding, SLP PPS Coordinator present : Daiva Nakayama, RN, CRRN     Current Status/Progress Goal Weekly Team Focus  Medical   Visual field deficits with gaze preference, visual hallucinations, difficulty keeping eyes open,  pusher tendencies and left bias with standing secondary to large intraventricular hemorrhage   Improve mobility, HTN, electrolytes, cognition  See above   Bowel/Bladder             Swallow/Nutrition/ Hydration   dys 2, thin liquids; full supervision, limited by alertness   supervision   trials of advanced textures per alertness    ADL's   Max assist for bathing in supportive sitting. Total +3 for LB bathing as well as all dressing.  Decreased sustained attention, keeps eyes closed with some verbal response to questions.  Left hemiparesis with increased tone in the left elbow, pusher tendencies to the left as well.  min assist overall, likely need to downgrade soon if attention does not improve  selfcare retraining, transfer training, balance retraining, cognitive retraining.   Mobility   maxA rolling L/R, maxA x 2 supine to sit, maxA x 2/total  SB transfer, limited by alertness/lethargy    min assist may need to downgrade  d/c planning, L NMR, endurance, global strengthening  Communication             Safety/Cognition/ Behavioral Observations  limited by alertness, fluctuates between min and max assist   min assist   basic to mildly complex tasks as mentation permits    Pain             Skin              Rehab Goals Patient on target to meet rehab goals: Yes Rehab Goals Revised: not yet, but monitoring *See Care Plan and progress notes for long and short-term goals.     Barriers to  Discharge  Current Status/Progress Possible Resolutions Date Resolved   Physician    Medical stability     See above  Therapies, optimize HTN meds, follow labs, IV steroids      Nursing                  PT                    OT                  SLP                SW                Discharge Planning/Teaching Needs:  Pt will likely need SNF transfer from CIR.  Dtr awaits any improvement over this next week.  defer to SNF vs. educaiton for dtr.   Team Discussion:  Pt has a lot going on medically with neurosurgery being reconsulted, but no recommendations/stable CT.  Dr. Posey Pronto has decreased her meds and started IV steroids and palliative consult ordered. Pt is incontinent of bowel and requiring cath every 4-6 hours for urine.  Lethargy is improving some, but still complains of headache and now has some hallucinations.  Pt much improved with OT with eyes open more, talking, bathing and dressing edge of bed.  Pt with left visual field cut and pushes to the left.  +2 for sit to stand due to pushing.  Pt needs total assist for sitting balance or she will fall forward.  PT had a harder time with pt due to still decreased alertness/awareness.  Pt needed max A for rolling an max A +2 for supine to sit.  Min A goals.  With ST, it is slow going, but pt is more awake than previous days.  Pt is pretty clear when awake and is on D2,thin diet and could probably be upgraded but cannot try this until pt is more alert.  Revisions to Treatment Plan:  none    Continued Need for Acute Rehabilitation Level of Care: The patient requires daily medical management by a physician with specialized training in physical medicine and rehabilitation for the following conditions: Daily direction of a multidisciplinary physical rehabilitation program to ensure safe treatment while eliciting the highest outcome that is of practical value to the patient.: Yes Daily medical management of patient stability for increased  activity during participation in an intensive rehabilitation regime.: Yes Daily analysis of laboratory values and/or radiology reports with any subsequent need for medication adjustment of medical intervention for : Blood pressure problems;Neurological problems   I attest that I was present, lead the team conference, and concur with the assessment and plan of the team.   Lakara Weiland, Silvestre Mesi 01/02/2018, 11:58 PM

## 2018-01-03 ENCOUNTER — Inpatient Hospital Stay (HOSPITAL_COMMUNITY): Payer: Medicare Other | Admitting: Physical Therapy

## 2018-01-03 MED ORDER — POTASSIUM CHLORIDE 20 MEQ/15ML (10%) PO SOLN
30.0000 meq | Freq: Two times a day (BID) | ORAL | Status: DC
  Administered 2018-01-03 – 2018-01-04 (×2): 30 meq via ORAL
  Filled 2018-01-03 (×2): qty 30

## 2018-01-03 MED ORDER — HYDRALAZINE HCL 10 MG PO TABS
10.0000 mg | ORAL_TABLET | Freq: Three times a day (TID) | ORAL | Status: DC
  Administered 2018-01-03 – 2018-01-18 (×42): 10 mg via ORAL
  Filled 2018-01-03 (×45): qty 1

## 2018-01-03 NOTE — Progress Notes (Signed)
Social Work Patient ID: Dionisio Paschal, female   DOB: [birth year], 84 y.o.   MRN: [id number]   CSW met with pt and dtr 12-30-17 after team conference discussion and CSW explained that if the goal is for pt to go home, then she would need 3 to 4 weeks on CIR.  Dtr expressed understanding, but then mentioned that she may need for pt to go to SNF, especially with her husband needing care post hospitalization, as well.  CSW told dtr that if plan is for SNF, then she would not stay on CIR for the 3 to 4 weeks.  She expressed understanding of this as well, and wants to see how pt does over the coming week with medical changes made to try to increase pt's alertness.  CSW will continue to follow and assist pt/dtr with d/c plan at their direction.

## 2018-01-03 NOTE — Progress Notes (Signed)
[name] PHYSICAL MEDICINE & REHABILITATION PROGRESS NOTE  Subjective/Complaints: Patient seen laying in bed this morning.  She required sternal rub to arouse, but after being aroused patient alert and interactive  ROS: Denies CP, shortness of breath, nausea, vomiting, diarrhea.  Objective: Vital Signs: Blood pressure (!) 172/76, pulse 70, temperature 98.3 F (36.8 C), temperature source Oral, resp. rate 16, height 5\' 5"  (1.651 m), weight 84.1 kg, SpO2 97 %. No results found. No results for input(s): WBC, HGB, HCT, PLT in the last 72 hours. Recent Labs    01/01/18 0455  NA 129*  K 3.6  CL 95*  CO2 22  GLUCOSE 143*  BUN 18  CREATININE 0.64  CALCIUM 9.0    Physical Exam: BP (!) 172/76   Pulse 70   Temp 98.3 F (36.8 C) (Oral)   Resp 16   Ht 5\' 5"  (1.651 m)   Wt 84.1 kg   SpO2 97%   BMI 30.85 kg/m  Constitutional: No distress . Vital signs reviewed. HENT: Normocephalic.  Atraumatic. Eyes: EOMI.  No discharge. Cardiovascular: Irregularly irregular.  No JVD. Respiratory: CTA bilaterally.  Normal effort. GI: BS +. Non-distended. Musc: No edema or tenderness in extremities. Musculoskeletal: Generalized edema Neurological:  Alert and oriented x2 Dysarthria Motor: Limited due to participation RUE: 4/5 proximal to distal RLE: 3+/5 proximal to distal (?  Effort) LUE: 4/5 proximal to distal  LLE: 3-/5 proximal to distal   (? effort) Skin: Skin is warm and dry.  Scattered ecchymotic areas.   Psychiatric: Unable to assess due to mentation   Assessment/Plan: 1. Functional deficits secondary to right intraventricular hemorrhage which require 3+ hours per day of interdisciplinary therapy in a comprehensive inpatient rehab setting.  Physiatrist is providing close team supervision and 24 hour management of active medical problems listed below.  Physiatrist and rehab team continue to assess barriers to discharge/monitor patient progress toward functional and medical  goals  Care Tool:  Bathing    Body parts bathed by patient: Right arm, Left arm, Abdomen, Face, Chest, Front perineal area   Body parts bathed by helper: Buttocks, Left lower leg, Right lower leg, Left upper leg, Right upper leg     Bathing assist Assist Level: Maximal Assistance - Patient 24 - 49%     Upper Body Dressing/Undressing Upper body dressing   What is the patient wearing?: Pull over shirt    Upper body assist Assist Level: Maximal Assistance - Patient 25 - 49%    Lower Body Dressing/Undressing Lower body dressing      What is the patient wearing?: Pants     Lower body assist Assist for lower body dressing: 2 Helpers     Toileting Toileting    Toileting assist Assist for toileting: 2 Helpers     Transfers Chair/bed transfer  Transfers assist  Chair/bed transfer activity did not occur: Safety/medical concerns  Chair/bed transfer assist level: 2 Helpers     Locomotion Ambulation   Ambulation assist   Ambulation activity did not occur: Safety/medical concerns          Walk 10 feet activity   Assist  Walk 10 feet activity did not occur: Safety/medical concerns        Walk 50 feet activity   Assist Walk 50 feet with 2 turns activity did not occur: Safety/medical concerns         Walk 150 feet activity   Assist Walk 150 feet activity did not occur: Safety/medical concerns  Walk 10 feet on uneven surface  activity   Assist Walk 10 feet on uneven surfaces activity did not occur: Safety/medical Armed forces technical officer activity did not occur: Safety/medical concerns         Wheelchair 50 feet with 2 turns activity    Assist    Wheelchair 50 feet with 2 turns activity did not occur: Safety/medical concerns       Wheelchair 150 feet activity     Assist Wheelchair 150 feet activity did not occur: Safety/medical concerns          Medical Problem List and  Plan: 1.  Visual field deficits with gaze preference, visual hallucinations, difficulty keeping eyes open,  pusher tendencies and left bias with standing secondary to large intraventricular hemorrhage   Cont CIR  Repeat head CT reviewed, with increase in edema, discussed with neurosurgery previously, no changes in management. Repeat head CT reviewed, stable.  Per Neurosurg, no intervention planned unless worsening CT.  Please see PA note.   Appreciate IM recs, signed off  IV steroids started on 11/11, increased on 11/12, decreased to 4 mg every 8 hours on 11/15, will monitor for now may need to increase dose again if necessary  Appreciate palliative care consult  Appears to have behavioral component at play with varying degrees of alertness as well as orientation and interactiveness at different times. 2.  DVT Prophylaxis/Anticoagulation: Mechanical: Sequential compression devices, below knee Bilateral lower extremities 3. Headaches/Pain Management: Limit sedating medications. Limit hydrocodone as needed for severe HA.   Topamax DC'd 4. Mood: LCSW to follow for evaluation and support when appropriate. Resumed home ativan prn at lower dose.   Provigil started on 11/14 5. Neuropsych: This patient is not capable of making decisions on her own behalf 6. Skin/Wound Care: Routine pressure relief measures 7. Fluids/Electrolytes/Nutrition: Monitor I's and O's.    D2 thins, advance diet as tolerated  Intake variable 8.  E. coli UTI: Completed 7 days of ctx/amoxicillin  Foley DC'd on 11/11 9.  HTN: Blood pressure goals less than 160/90.  Continue toprolol twice daily and increased Avapro to 300 mg on 11/13.   Metoprolol increased to 50 twice daily on 11/14  Hydralazine 10 3 times daily started on 11/17-noted allergy with itching, but no rash, will re-challenge.  Labile on 11/17 10.  Seizure prophylaxis: Continue Keppra twice daily, DC'd on 11/12. 11.  Urinary retention: Discontinued Ditropan.   Continue Flomax routine pressure relief measures. 12.  Leukocytosis:   WBCs 14.3 on 11/13, confounded by steroids  Labs ordered for tomorrow  ABX per IM DC'd on 11/11  Continue to monitor 13. Hyponatremia:   Sodium 129 on 11/15  Labs ordered for tomorrow  IVF DC'd by IM, restarted on 11/12, d/ced on 11/13  Continue to monitor 14. H/o Barrett's esophagus/GERD: On Protonix 15.  Hypokalemia  Potassium 3.6 on 11/15  Labs ordered for tomorrow  Supplement increased on 11/1 16.  Hypoalbuminemia  Supplement initiated on 11/7 17.  Acute blood loss anemia  Hemoglobin 10.8 on 11/11 18.  Fevers and lethargy: Fevers resolved, lethargy persists -?  Behavioral, see above  Appreciate IM recs, signed off  Antibiotics DC'd 19.  Hypomagnesemia  Magnesium 1.7 on 11/13  Continue supplementation  20. Steroid induced hyperglycemia   Cont to monitor  LOS: 11 days A FACE TO FACE EVALUATION WAS PERFORMED  [name] [name] 01/03/2018, 6:35 AM

## 2018-01-03 NOTE — Plan of Care (Signed)
  Problem: RH SKIN INTEGRITY Goal: RH STG MAINTAIN SKIN INTEGRITY WITH ASSISTANCE Description STG Maintain Skin Integrity With mod Assistance.  Outcome: Progressing   Problem: RH SAFETY Goal: RH STG ADHERE TO SAFETY PRECAUTIONS W/ASSISTANCE/DEVICE Description STG Adhere to Safety Precautions With Assistance/Device. Mod  Outcome: Progressing   Problem: RH COGNITION-NURSING Goal: RH STG USES MEMORY AIDS/STRATEGIES W/ASSIST TO PROBLEM SOLVE Description STG Uses Memory Aids/Strategies With Assistance to Problem Solve. Mod  Outcome: Progressing   Problem: RH PAIN MANAGEMENT Goal: RH STG PAIN MANAGED AT OR BELOW PT'S PAIN GOAL Description Less than 5  Outcome: Progressing   Problem: RH BOWEL ELIMINATION Goal: RH STG MANAGE BOWEL WITH ASSISTANCE Description STG Manage Bowel with Assistance. Mod  Outcome: Not Progressing Goal: RH STG MANAGE BOWEL W/MEDICATION W/ASSISTANCE Description STG Manage Bowel with Medication with Assistance. mod  Outcome: Not Progressing   Problem: RH BLADDER ELIMINATION Goal: RH STG MANAGE BLADDER WITH ASSISTANCE Description STG Manage Bladder With mod Assistance   Outcome: Not Progressing

## 2018-01-03 NOTE — Progress Notes (Signed)
Physical Therapy Session Note  Patient Details  Name: [name] MRN: [id number] Date of Birth: 1927/01/02  Today's Date: 01/03/2018 PT Individual Time: 1300-1329 PT Individual Time Calculation (min): 29 min   Short Term Goals: Week 1:  PT Short Term Goal 1 (Week 1): Pt will perform bed mobility with max assist of 1 consistently  PT Short Term Goal 1 - Progress (Week 1): Progressing toward goal PT Short Term Goal 2 (Week 1): Pt will sit EOB x 3 min with mod assist  PT Short Term Goal 2 - Progress (Week 1): Not met PT Short Term Goal 3 (Week 1): Pt will perform bed<>WC transfers with max assist  PT Short Term Goal 3 - Progress (Week 1): Not met PT Short Term Goal 4 (Week 1): Pt will tolerate sitting in WC for up to 2 hours to prepare for out of bed mobility  PT Short Term Goal 4 - Progress (Week 1): Not met  Skilled Therapeutic Interventions/Progress Updates: Pt presented in bed awake c/o generalized soreness and requests to stay in bed. Discussed with pt benefits of OOB and that pt has been in bed since yesterday. Pt performed roll to R maxA to check for incontinence due to odor (gas). Performed supine to sit maxA with pt able to move BLE to R towards EOB however needed total A for truncal support and required total A for scooting to EOB. Pt required max multimodal cues for improving erect posture. Pt able to correct briefly but unable to maintain for more than approx 5 sec. Participated in STS with Stedy maxA x 2 and transferred to [facility] chair. Pt repositioned to comfort and left with call bell within reach and needs met.      Therapy Documentation Precautions:  Precautions Precautions: Fall Precaution Comments: pushing left, decreased attention, left hemiparesis Restrictions Weight Bearing Restrictions: No General:   Vital Signs: Therapy Vitals Pulse Rate: 62 Resp: 18 BP: 110/66 Patient Position (if appropriate): Sitting Oxygen Therapy SpO2: 99 % O2 Device: Room  Air    Therapy/Group: Individual Therapy  Braylee Bosher  Irelynd Zumstein, PTA  01/03/2018, 3:48 PM

## 2018-01-04 ENCOUNTER — Inpatient Hospital Stay (HOSPITAL_COMMUNITY): Payer: Medicare Other

## 2018-01-04 ENCOUNTER — Inpatient Hospital Stay (HOSPITAL_COMMUNITY): Payer: Medicare Other | Admitting: *Deleted

## 2018-01-04 ENCOUNTER — Inpatient Hospital Stay (HOSPITAL_COMMUNITY): Payer: Medicare Other | Admitting: Occupational Therapy

## 2018-01-04 LAB — BASIC METABOLIC PANEL
Anion gap: 5 (ref 5–15)
BUN: 32 mg/dL — AB (ref 8–23)
CALCIUM: 9 mg/dL (ref 8.9–10.3)
CHLORIDE: 94 mmol/L — AB (ref 98–111)
CO2: 30 mmol/L (ref 22–32)
CREATININE: 0.93 mg/dL (ref 0.44–1.00)
GFR calc Af Amer: >60 mL/min (ref >60)
GFR calc non Af Amer: 53 mL/min — ABNORMAL LOW (ref >60)
GLUCOSE: 133 mg/dL — AB (ref 70–99)
Potassium: 4.5 mmol/L (ref 3.5–5.1)
Sodium: 129 mmol/L — ABNORMAL LOW (ref 135–145)

## 2018-01-04 LAB — CBC
HEMATOCRIT: 42.7 % (ref 36.0–46.0)
Hemoglobin: 13.9 g/dL (ref 12.0–15.0)
MCH: 28.8 pg (ref 26.0–34.0)
MCHC: 32.6 g/dL (ref 30.0–36.0)
MCV: 88.4 fL (ref 80.0–100.0)
Platelets: 430 10*3/uL — ABNORMAL HIGH (ref 150–400)
RBC: 4.83 MIL/uL (ref 3.87–5.11)
RDW: 13.5 % (ref 11.5–15.5)
WBC: 21.7 10*3/uL — ABNORMAL HIGH (ref 4.0–10.5)
nRBC: 0 % (ref 0.0–0.2)

## 2018-01-04 MED ORDER — DEXAMETHASONE SODIUM PHOSPHATE 4 MG/ML IJ SOLN
4.0000 mg | Freq: Two times a day (BID) | INTRAMUSCULAR | Status: DC
  Administered 2018-01-04 – 2018-01-07 (×6): 4 mg via INTRAVENOUS
  Filled 2018-01-04 (×7): qty 1

## 2018-01-04 MED ORDER — POTASSIUM CHLORIDE 20 MEQ/15ML (10%) PO SOLN
30.0000 meq | Freq: Every day | ORAL | Status: DC
  Administered 2018-01-05 – 2018-01-11 (×7): 30 meq via ORAL
  Filled 2018-01-04 (×7): qty 30

## 2018-01-04 NOTE — Progress Notes (Signed)
[place] PHYSICAL MEDICINE & REHABILITATION PROGRESS NOTE  Subjective/Complaints: Patient seen laying in bed this morning about to work with therapies.  She states she slept well overnight.  Limited interaction.  ROS: Denies CP, shortness of breath, nausea, vomiting, diarrhea.  Objective: Vital Signs: Blood pressure (!) 156/77, pulse (!) 102, temperature 98.4 F (36.9 C), resp. rate 16, height 5\' 5"  (1.651 m), weight 84.1 kg, SpO2 98 %. No results found. Recent Labs    01/04/18 0739  WBC 21.7*  HGB 13.9  HCT 42.7  PLT 430*   Recent Labs    01/04/18 0739  NA 129*  K 4.5  CL 94*  CO2 30  GLUCOSE 133*  BUN 32*  CREATININE 0.93  CALCIUM 9.0    Physical Exam: BP (!) 156/77 (BP Location: Left Arm)   Pulse (!) 102   Temp 98.4 F (36.9 C)   Resp 16   Ht 5\' 5"  (1.651 m)   Wt 84.1 kg   SpO2 98%   BMI 30.85 kg/m  Constitutional: No distress . Vital signs reviewed. HENT: Normocephalic.  Atraumatic. Eyes: EOMI.  No discharge. Cardiovascular: Irregularly irregular.  No JVD. Respiratory: CTA bilaterally.  Normal effort. GI: BS +. Non-distended. Musc: No edema or tenderness in extremities. Musculoskeletal: Generalized edema Neurological:  Alert and oriented x2 Dysarthria Motor: Limited due to participation RUE: 4/5 proximal to distal RLE: 3+/5 proximal to distal (?  Effort) LUE: 4/5 proximal to distal  LLE: 3-/5 proximal to distal   (? effort) Skin: Skin is warm and dry.  Scattered ecchymotic areas.   Psychiatric: Unable to assess due to mentation   Assessment/Plan: 1. Functional deficits secondary to right intraventricular hemorrhage which require 3+ hours per day of interdisciplinary therapy in a comprehensive inpatient rehab setting.  Physiatrist is providing close team supervision and 24 hour management of active medical problems listed below.  Physiatrist and rehab team continue to assess barriers to discharge/monitor patient progress toward functional and  medical goals  Care Tool:  Bathing    Body parts bathed by patient: Right arm, Left arm, Abdomen, Face, Chest, Front perineal area   Body parts bathed by helper: Buttocks, Left lower leg, Right lower leg, Left upper leg, Right upper leg     Bathing assist Assist Level: Maximal Assistance - Patient 24 - 49%     Upper Body Dressing/Undressing Upper body dressing   What is the patient wearing?: Pull over shirt    Upper body assist Assist Level: Maximal Assistance - Patient 25 - 49%    Lower Body Dressing/Undressing Lower body dressing      What is the patient wearing?: Pants     Lower body assist Assist for lower body dressing: 2 Helpers     Toileting Toileting    Toileting assist Assist for toileting: 2 Helpers     Transfers Chair/bed transfer  Transfers assist  Chair/bed transfer activity did not occur: Safety/medical concerns  Chair/bed transfer assist level: 2 Helpers     Locomotion Ambulation   Ambulation assist   Ambulation activity did not occur: Safety/medical concerns          Walk 10 feet activity   Assist  Walk 10 feet activity did not occur: Safety/medical concerns        Walk 50 feet activity   Assist Walk 50 feet with 2 turns activity did not occur: Safety/medical concerns         Walk 150 feet activity   Assist Walk 150 feet activity did  not occur: Safety/medical concerns         Walk 10 feet on uneven surface  activity   Assist Walk 10 feet on uneven surfaces activity did not occur: Safety/medical Armed forces technical officer activity did not occur: Safety/medical concerns         Wheelchair 50 feet with 2 turns activity    Assist    Wheelchair 50 feet with 2 turns activity did not occur: Safety/medical concerns       Wheelchair 150 feet activity     Assist Wheelchair 150 feet activity did not occur: Safety/medical concerns          Medical Problem  List and Plan: 1.  Visual field deficits with gaze preference, visual hallucinations, difficulty keeping eyes open,  pusher tendencies and left bias with standing secondary to large intraventricular hemorrhage   Cont CIR  Repeat head CT reviewed, with increase in edema, discussed with neurosurgery previously, no changes in management. Repeat head CT reviewed, stable.  Per Neurosurg, no intervention planned unless worsening CT.  Please see PA note.   Appreciate IM recs, signed off  IV steroids started on 11/11, increased on 11/12, decreased to 4 mg every 8 hours on 11/15, decreased to 4 mg twice daily on 11/18  Appreciate palliative care consult  Appears to have behavioral component at play with varying degrees of alertness as well as orientation and interactiveness at different times. 2.  DVT Prophylaxis/Anticoagulation: Mechanical: Sequential compression devices, below knee Bilateral lower extremities 3. Headaches/Pain Management: Limit sedating medications. Limit hydrocodone as needed for severe HA.   Topamax DC'd 4. Mood: LCSW to follow for evaluation and support when appropriate. Resumed home ativan prn at lower dose.   Provigil started on 11/14 5. Neuropsych: This patient is not capable of making decisions on her own behalf 6. Skin/Wound Care: Routine pressure relief measures 7. Fluids/Electrolytes/Nutrition: Monitor I's and O's.    D2 thins, advance diet as tolerated  Intake variable 8.  E. coli UTI: Completed 7 days of ctx/amoxicillin  Foley DC'd on 11/11 9.  HTN: Blood pressure goals less than 160/90.  Continue toprolol twice daily and increased Avapro to 300 mg on 11/13.   Metoprolol increased to 50 twice daily on 11/14  Hydralazine 10 3 times daily started on 11/17-noted allergy with itching, but no rash, will re-challenge.  Labile on 11/18 10.  Seizure prophylaxis: Continue Keppra twice daily, DC'd on 11/12. 11.  Urinary retention: Discontinued Ditropan.  Continue Flomax routine  pressure relief measures. 12.  Leukocytosis:   WBCs 21.7 on 11/18, confounded by steroids  Afebrile  ABX per IM DC'd on 11/11  Continue to monitor 13. Hyponatremia:   Sodium 129 on 11/18  IVF DC'd by IM, restarted on 11/12, d/ced on 11/13  Continue to monitor 14. H/o Barrett's esophagus/GERD: On Protonix 15.  Hypokalemia  Potassium 4.5 on 11/18  Supplement increased on 11/1, decreased on 11/18 16.  Hypoalbuminemia  Supplement initiated on 11/7 17.  Acute blood loss anemia  Hemoglobin 10.8 on 11/11 18.  Fevers and lethargy: Fevers resolved, lethargy persists -?  Behavioral, see above  Appreciate IM recs, signed off  Antibiotics DC'd 19.  Hypomagnesemia  Magnesium 1.7 on 11/13  Continue supplementation  20. Steroid induced hyperglycemia   Cont to monitor  LOS: 12 days A FACE TO FACE EVALUATION WAS PERFORMED   [name] 01/04/2018, 9:22 AM

## 2018-01-04 NOTE — Progress Notes (Signed)
Speech Language Pathology Daily Session Note  Patient Details  Name: [name] MRN: [id number] Date of Birth: 03/10/1926  Today's Date: 01/04/2018 SLP Individual Time: 1572-6203 SLP Individual Time Calculation (min): 60 min  Short Term Goals: Week 2: SLP Short Term Goal 1 (Week 2): Pt will consume trials of dys 3 textures and thin liquids with min cues for use of swallowing precautions and minimal overt s/s of aspiration over three consecutive sessions prior to advancement.   SLP Short Term Goal 2 (Week 2): Pt will sustain her attention to basic, functional tasks for 5 minutes with mod cues for redirection.   SLP Short Term Goal 3 (Week 2): Pt will complete basic, familiar tasks with mod assist for functional problem solving.   SLP Short Term Goal 4 (Week 2): Pt will locate items to the left of midline in ~50% of opportunities during basic, familiar tasks with mod assist.    Skilled Therapeutic Interventions:Skilled ST services focused on swallow and cognitive skills. Pt had cold washcloth over eyes upon entering room, due to headache. Pallative care MD, entered room and pt expressed concerns of dental pain, dental consult was resquested and communicated to nursing staff. SLP facilitated PO consumption of dys 3 trial, pt demonstrated extreme prolonged mastication, texture would be likely inappropriate at this stage due to fatigue. Pt expressed pain and fatigue during mastication of meat, however concerned about protein intake. SLP and nursing staff communicated about requesting order for breeze with each meal to provide supplemental protein and pt avoiding meats, which still allows her to be on the least restrictive diet, dys 2. SLP facilitated basic problem solving utilizing simple picture cards, pt required max A verbal cues to identify actions, deficits further impacted by missing glasses. Pt was left in room with call bell within reach and bed alarm set. Recommend to continue skilled ST  services.       Pain Pain Assessment Pain Score: 0-No pain  Therapy/Group: Individual Therapy  Mariska Daffin  [facility] 01/04/2018, 12:20 PM

## 2018-01-04 NOTE — Progress Notes (Addendum)
Daily Progress Note   Patient Name: [name]       Date: 01/04/2018 DOB: 20-Apr-1926  Age: 84 y.o. MRN#: [id number] Attending Physician: Jamse Arn, MD Primary Care Physician: Merrilee Seashore, MD Admit Date: 12/23/2017  Reason for Consultation/Follow-up: Establishing goals of care  Subjective: Patient is sitting in bed. She is working with SLP. She is having difficulty with seeing to work with therapy on looking at cards. Recommend family bringing her glasses. She complains she is having jaw and gum pain with eating. She states she had dental implants and one fell out. She states she had dental "surgery" prior to her admission date of 11/6. Would recommend dentistry consult as she has leukocytosis and is still having pain such that her current diet is painful. Would consider downgrading her diet to help her chewing.     Length of Stay: 12  Current Medications: Scheduled Meds:  . acetaminophen  650 mg Oral Q4H  . calcium carbonate  1 tablet Oral Q breakfast  . cholecalciferol  1,000 Units Oral Daily  . dexamethasone  4 mg Intravenous Q12H  . docusate sodium  100 mg Oral BID  . feeding supplement (PRO-STAT SUGAR FREE 64)  30 mL Oral BID  . hydrALAZINE  10 mg Oral Q8H  . irbesartan  300 mg Oral Daily  . magnesium oxide  400 mg Oral BID  . mouth rinse  15 mL Mouth Rinse BID  . metoprolol tartrate  50 mg Oral BID  . modafinil  100 mg Oral Daily  . pantoprazole  40 mg Oral Daily  . polyethylene glycol  17 g Oral Daily  . [START ON 01/05/2018] potassium chloride  30 mEq Oral Daily  . ranitidine  150 mg Oral BID  . tamsulosin  0.4 mg Oral Daily    Continuous Infusions:   PRN Meds: acetaminophen, alum & mag hydroxide-simeth, bisacodyl, cloNIDine,  guaiFENesin-dextromethorphan, lidocaine, nitroGLYCERIN, ondansetron (ZOFRAN) IV, polyethylene glycol, prochlorperazine **OR** prochlorperazine **OR** prochlorperazine, simethicone, sodium phosphate  Physical Exam  Constitutional: No distress.  Pulmonary/Chest: Effort normal.  Neurological: She is alert.  Skin: Skin is warm and dry.            Vital Signs: BP (!) 156/77 (BP Location: Left Arm)   Pulse (!) 102   Temp 98.4 F (36.9 C)  Resp 16   Ht 5\' 5"  (1.651 m)   Wt 84.1 kg   SpO2 98%   BMI 30.85 kg/m  SpO2: SpO2: 98 % O2 Device: O2 Device: Room Air O2 Flow Rate:    Intake/output summary:   Intake/Output Summary (Last 24 hours) at 01/04/2018 1016 Last data filed at 01/04/2018 0800 Gross per 24 hour  Intake 684 ml  Output -  Net 684 ml   LBM: Last BM Date: 01/02/18 Baseline Weight: Weight: 84.1 kg Most recent weight: Weight: 84.1 kg       Palliative Assessment/Data: 40%    Flowsheet Rows     Most Recent Value  Intake Tab  Referral Department  -- [Rehab]  Unit at Time of Referral  Other (Comment)  Palliative Care Primary Diagnosis  Neurology  Date Notified  12/29/17  Palliative Care Type  New Palliative care  Reason for referral  Clarify Goals of Care, Counsel Regarding Hospice  Date of Admission  12/23/17  Date first seen by Palliative Care  12/29/17  # of days Palliative referral response time  0 Day(s)  # of days IP prior to Palliative referral  6  Clinical Assessment  Psychosocial & Spiritual Assessment  Palliative Care Outcomes      Patient Active Problem List   Diagnosis Date Noted  . Steroid-induced hyperglycemia   . Benign essential HTN   . Labile blood pressure   . Hypertensive crisis   . Cerebral edema (HCC)   . Hypomagnesemia   . Dysphagia, post-stroke   . Lethargy   . Acute blood loss anemia   . Hypoalbuminemia due to protein-calorie malnutrition ([place])   . ICH (intracerebral hemorrhage) ([place]) 12/23/2017  . History of Barrett's  esophagus   . Urinary retention   . Seizure prophylaxis   . E. coli UTI   . Intraventricular hemorrhage ([place])   . Urinary tract infection without hematuria   . Coronary artery disease involving native coronary artery of native heart without angina pectoris   . Pulmonary HTN ([place])   . Leukocytosis   . Intracranial hemorrhage ([place]) 12/18/2017  . Syncope 04/10/2015  . Sepsis secondary to UTI ([place]) 04/10/2015  . URI (upper respiratory infection) 04/10/2015  . UTI (lower urinary tract infection) 04/10/2015  . QT prolongation 10/12/2014  . Headache 09/02/2012  . Tricuspid valve regurgitation, moderate 08/18/2012  . Hypokalemia 08/16/2012  . Hyponatremia 08/16/2012  . Atrial fibrillation ([place])   . Hypertension   . Anxiety   . Mixed hyperlipidemia 09/11/2009  . GERD 08/30/2008  . CHEST PAIN 08/30/2008  . Coronary atherosclerosis 04/04/2008  . Syncope and collapse 04/04/2008    Palliative Care Assessment & Plan    Recommendations/Plan: Recommend dentistry consult.   Recommend palliative at D/C.   Will shadow for decline.     Code Status:    Code Status Orders  (From admission, onward)         Start     Ordered   12/23/17 1656  Do not attempt resuscitation (DNR)  Continuous    Question Answer Comment  In the event of cardiac or respiratory ARREST Do not call a "code blue"   In the event of cardiac or respiratory ARREST Do not perform Intubation, CPR, defibrillation or ACLS   In the event of cardiac or respiratory ARREST Use medication by any route, position, wound care, and other measures to relive pain and suffering. May use oxygen, suction and manual treatment of airway obstruction as needed for comfort.  12/23/17 1655        Code Status History    Date Active Date Inactive Code Status Order ID Comments User Context   12/23/2017 1655 12/23/2017 1655 DNR [id number]  Flora Lipps Inpatient   12/18/2017 1800 12/23/2017 1620 DNR [id number]  Traci Sermon, PA-C ED   04/10/2015 2047 04/13/2015 1542 Full Code [id number]  Etta Quill, DO ED   10/12/2014 1806 10/13/2014 1632 Full Code [id number]  Samella Parr, NP Inpatient    Advance Directive Documentation     Most Recent Value  Type of Advance Directive  Healthcare Power of Attorney, Living will  Pre-existing out of facility DNR order (yellow form or pink MOST form)  -  "MOST" Form in Place?  -       Prognosis:   Unable to determine  Discharge Planning:  [place] for rehab with Palliative care service follow-up   Thank you for allowing the Palliative Medicine Team to assist in the care of this patient.   Total Time 25 min Prolonged Time Billed no      Greater than 50%  of this time was spent counseling and coordinating care related to the above assessment and plan.  Asencion Gowda, NP  Please contact Palliative Medicine Team phone at [phone] for questions and concerns.

## 2018-01-04 NOTE — Progress Notes (Signed)
Physical Therapy Session Note  Patient Details  Name: [name] MRN: [id number] Date of Birth: 1927/01/19  Today's Date: 01/04/2018 PT Individual Time: 0800-0843 PT Individual Time Calculation (min): 43 min   Short Term Goals: Week 1:  PT Short Term Goal 1 (Week 1): Pt will perform bed mobility with max assist of 1 consistently  PT Short Term Goal 1 - Progress (Week 1): Progressing toward goal PT Short Term Goal 2 (Week 1): Pt will sit EOB x 3 min with mod assist  PT Short Term Goal 2 - Progress (Week 1): Not met PT Short Term Goal 3 (Week 1): Pt will perform bed<>WC transfers with max assist  PT Short Term Goal 3 - Progress (Week 1): Not met PT Short Term Goal 4 (Week 1): Pt will tolerate sitting in WC for up to 2 hours to prepare for out of bed mobility  PT Short Term Goal 4 - Progress (Week 1): Not met  Skilled Therapeutic Interventions/Progress Updates: Pt presented in bed awake and agreeable to therapy. Attempted participation in ankle pumps, hip abd/add, heel slides, and SLR x 10 bilaterally. Pt able to complete each set with tactile cues for facilitation of movement. Performed supine to sit total A with HOB elevated to 45degrees. Pt set up to Stedy at EOB and performed STS x 1 maxA x 2. PTA noted bowl incontinence returned to sitting. Sit to supine total A and performed rolling L/R maxA x 1 to L, x 2 to R to complete peri-care. Pt remained in bed at end of session with pt sleeping and call bell within reach.      Therapy Documentation Precautions:  Precautions Precautions: Fall Precaution Comments: pushing left, decreased attention, left hemiparesis Restrictions Weight Bearing Restrictions: No General:   Vital Signs: Therapy Vitals Temp: 98.4 F (36.9 C) Pulse Rate: (!) 102 Resp: 16 BP: (!) 156/77 Patient Position (if appropriate): Lying Oxygen Therapy SpO2: 98 % O2 Device: Room Air    Therapy/Group: Individual Therapy  Shaquina Gillham  Ohanna Gassert,  PTA  01/04/2018, 9:04 AM

## 2018-01-04 NOTE — Progress Notes (Signed)
Occupational Therapy Session Note  Patient Details  Name: [name] MRN: [id number] Date of Birth: February 09, 1927  Today's Date: 01/04/2018 OT Individual Time: 1135-1200 OT Individual Time Calculation (min): 25 min    Short Term Goals: Week 1:  OT Short Term Goal 1 (Week 1): Pt will transition to EOB with max +2 OT Short Term Goal 1 - Progress (Week 1): Not met OT Short Term Goal 2 (Week 1): Pt will maintain EOB sitting balance with max +2 OT Short Term Goal 2 - Progress (Week 1): Not met OT Short Term Goal 3 (Week 1): Pt will complete grooming task at sink with mod A OT Short Term Goal 3 - Progress (Week 1): Not met OT Short Term Goal 4 (Week 1): Pt will don shirt with max A OT Short Term Goal 4 - Progress (Week 1): Not met Week 2:  OT Short Term Goal 1 (Week 2): Pt will maintain sustained attention for 15 mins to ADL task with no more than min instructional cueing to keep her eyes open.  OT Short Term Goal 2 (Week 2): Pt will complete UB bathing with mod instructional cueing and min assist in supported sitting.  OT Short Term Goal 3 (Week 2): Pt will sit EOB with mod assist for static sitting balance for at least 2 mins in preparation for selfcare tasks or transfers. OT Short Term Goal 4 (Week 2): Pt will perform sit to stand with max assist during LB selfcare tasks.  OT Short Term Goal 5 (Week 2): Pt will use the LUE as a gross assist with max hand over hand facilitation for washing the right arm during bathing tasks.      Skilled Therapeutic Interventions/Progress Updates:    Pt received in bed and discussed her worries about her medicare not covering her hospital stay and that she would get 'kicked out".  Reassured pt that is not the case and she will be taken care of.  The therapy session plan was to get pt to EOB and to the w/c.  Began session with ROM to extremities then noticed pt was incontinent of bowel.  Pt worked on tolerating slow rolling with max A (she is sensitive to  rolling - gets dizzy) as NT and therapist assisted with cleansing.  By the time clean up finished, the session time was over. Pt's NT in room with pt to A her with lunch.  Therapy Documentation Precautions:  Precautions Precautions: Fall Precaution Comments: pushing left, decreased attention, left hemiparesis Restrictions Weight Bearing Restrictions: No  Pain: Pain Assessment Pain Score: 0-No pain    Therapy/Group: Individual Therapy  Sanford Lindblad 01/04/2018, 12:35 PM

## 2018-01-05 ENCOUNTER — Inpatient Hospital Stay (HOSPITAL_COMMUNITY): Payer: Medicare Other

## 2018-01-05 ENCOUNTER — Inpatient Hospital Stay (HOSPITAL_COMMUNITY): Payer: Medicare Other | Admitting: Physical Therapy

## 2018-01-05 ENCOUNTER — Inpatient Hospital Stay (HOSPITAL_COMMUNITY): Payer: Medicare Other | Admitting: Occupational Therapy

## 2018-01-05 MED ORDER — BOOST / RESOURCE BREEZE PO LIQD CUSTOM
1.0000 | Freq: Three times a day (TID) | ORAL | Status: DC
  Administered 2018-01-05 – 2018-01-18 (×27): 1 via ORAL

## 2018-01-05 NOTE — Progress Notes (Signed)
Initial Nutrition Assessment  DOCUMENTATION CODES:   Non-severe (moderate) malnutrition in context of acute illness/injury, Obesity unspecified  INTERVENTION:   - Recommend obtaining new weight as last recorded weight is from 12/23/17  - Continue 30 ml Prostat BID, each supplement provides 100 kcals and 15 grams protein  - Continue soup BID with lunch and dinner meals  - Continue soy milk TID with meals  - Boost Breeze po TID, each supplement provides 250 kcal and 9 grams of protein  NUTRITION DIAGNOSIS:   Moderate Malnutrition related to chronic illness (dementia) as evidenced by mild fat depletion, moderate fat depletion, mild muscle depletion, moderate muscle depletion.  Ongoing  GOAL:   Patient will meet greater than or equal to 90% of their needs  Progressing  MONITOR:   PO intake, Weight trends, I & O's, Labs, Skin  REASON FOR ASSESSMENT:   Consult Calorie Count  ASSESSMENT:   84 year old female with PMH significant for dementia, atrial fibrillation, pulmonary hypertension, hyperlipidemia, GERD, and Barrett's esophagus. Pt was admitted on 12/18/2017 with complaints of headache and blurry vision. CT of head showed intraventricular hemorrhage. Per Neurosurgery, no need for surgical intervention.  Spoke with RN who reports pt's PO intake varies. Per RN, pt enjoys the vanilla soy milk that comes on her trays. Pt also likes the sides and soups but has trouble eating the meats even with dysphagia 2 texture. Per RN, SLP attempted to have only pt's meats come pureed (Dysphagia 1) but nutritional services stated that they cannot do this. Per RN, SLP hesitant to downgrade entire diet to Dysphagia 1 given pt is eating and tolerating sides on Dysphagia 2.  RN also reports pt likes Boost Breeze oral nutrition supplements. Will order TID with meals.  No new weights since admission. RD attempted to obtain weight. However, pt in wheelchair at time of visit.  Spoke with pt  briefly at bedside as pt was lethargic and difficult to arouse. Pt nodded to RD questions regarding appetite and eating but did not verbalize answers.  Meal Completion: 25-100% x last 7 meals (varied)  Medications reviewed and include: calcium carbonate, cholecalciferol, Colace, Pro-stat BID, magnesium oxide, Protonix, Miralax, KCl 30 mEq daily  Labs reviewed: sodium 129 (L), BUN 32 (H)  Diet Order:   Diet Order            DIET DYS 2 Room service appropriate? Yes; Fluid consistency: Thin  Diet effective now              EDUCATION NEEDS:   Education needs have been addressed  Skin:  Skin Assessment: Skin Integrity Issues: Other: left arm skin tear  Last BM:  11/18 (medium type 7)  Height:   Ht Readings from Last 1 Encounters:  12/23/17 5\' 5"  (1.651 m)    Weight:   Wt Readings from Last 1 Encounters:  12/23/17 84.1 kg    Ideal Body Weight:  56.82 kg  BMI:  Body mass index is 30.85 kg/m.  Estimated Nutritional Needs:   Kcal:  1500-1700  Protein:  85-100 grams  Fluid:  >/= 1.5 L    Gaynell Face, MS, RD, LDN Inpatient Clinical Dietitian Pager: [phone] Weekend/After Hours: [phone]

## 2018-01-05 NOTE — Progress Notes (Signed)
Occupational Therapy Session Note  Patient Details  Name: [name] MRN: [id number] Date of Birth: January 19, 1927  Today's Date: 01/05/2018 OT Individual Time: 2197-5883 OT Individual Time Calculation (min): 25 min  and Today's Date: 01/05/2018 OT Missed Time: 20 Minutes Missed Time Reason: Patient fatigue;Other (comment)(pt asleep)   Short Term Goals: Week 2:  OT Short Term Goal 1 (Week 2): Pt will maintain sustained attention for 15 mins to ADL task with no more than min instructional cueing to keep her eyes open.  OT Short Term Goal 2 (Week 2): Pt will complete UB bathing with mod instructional cueing and min assist in supported sitting.  OT Short Term Goal 3 (Week 2): Pt will sit EOB with mod assist for static sitting balance for at least 2 mins in preparation for selfcare tasks or transfers. OT Short Term Goal 4 (Week 2): Pt will perform sit to stand with max assist during LB selfcare tasks.  OT Short Term Goal 5 (Week 2): Pt will use the LUE as a gross assist with max hand over hand facilitation for washing the right arm during bathing tasks.   Skilled Therapeutic Interventions/Progress Updates:    Pt received in tilt in space w/c sound asleep. NT reported she was alert earlier and ate breakfast.  Pt kept eyes closed the entire session, but would mumble "hurts" with passive movements but could not say what hurt.  Tried several strategies to wake patient, including trying to bring her fully upright in the w/c and even trying to have pt stand toSTEDY with total A of 2 to increase alertness. Pt with forward head, eyes closed and forward trunk. The initial attempt to have pt rise to [facility] was clear that it was not safe to try. Pt too fatigued to engage in any conversation.  Pt readjusted in w/c and tilted back. Belt alarm set and pillow placed under L arm to support arm. Pt sound asleep.    LTGs will be down graded from min A to mod/max A as pt has not been able to actively engage in  the majority of her OT sessions.  She has in the past days been more alert and talkative, but even on those days she has needed max A - total A with her mobility.    Therapy Documentation Precautions:  Precautions Precautions: Fall Precaution Comments: pushing left, decreased attention, left hemiparesis Restrictions Weight Bearing Restrictions: No    Vital Signs: Therapy Vitals Temp: 97.6 F (36.4 C) Temp Source: Oral Pulse Rate: 65 Resp: 17 BP: (!) 145/73 Patient Position (if appropriate): Lying Oxygen Therapy SpO2: 99 % O2 Device: Room Air Pain:  pt mumbled "hurts" with passive attempts to lift her arms    Therapy/Group: Individual Therapy  Erastus Bartolomei 01/05/2018, 9:12 AM

## 2018-01-05 NOTE — Progress Notes (Signed)
Physical Therapy Session Note  Patient Details  Name: [name] MRN: [id number] Date of Birth: 04-Nov-1926  Today's Date: 01/05/2018 PT Individual Time: 0800-0840 PT Individual Time Calculation (min): 40 min   Short Term Goals: Week 2:  PT Short Term Goal 1 (Week 2): Pt will perform bed WC transfer with Max assist of 1.  PT Short Term Goal 2 (Week 2): Pt Will initiate WC mobility training  PT Short Term Goal 3 (Week 2): Pt will maintain sitting balacne with moderate assist up to 5 minutes   Skilled Therapeutic Interventions/Progress Updates: Pt presented in bed awake, alert, and agreeable to therapy. Pt noted to have urinary incontinence performed rolling L/R maxA for peri-care. Performed supine to sit maxA with [facility] assist for hand placement on bed rails and max multimodal cues for BLE placement. Pt was able to achieve neutral sitting for approx 10 sec with modA with pt quick to fatigue and begin to lean forward. Pt performed STS with maxA x 2 in Stedy for transfer to TIS with maxA to achieve neutral sitting balance. MaxA x 2 scooting back in TIS. Pt becoming more lethargic after activity. Pt was able to comb hair for several strokes and wipe face with warm washcloth. Pt remained in w/c at end of session and left with call bell within reach and nsg notified.      Therapy Documentation Precautions:  Precautions Precautions: Fall Precaution Comments: pushing left, decreased attention, left hemiparesis Restrictions Weight Bearing Restrictions: No General:   Vital Signs: Therapy Vitals Pulse Rate: 70 Resp: 18 BP: (!) 121/57 Patient Position (if appropriate): Sitting Oxygen Therapy SpO2: 94 % O2 Device: Room Air Pain: Pain Assessment Pain Scale: 0-10 Pain Score: 4  Pain Type: Acute pain Pain Location: Neck Pain Intervention(s): RN made aware;Medication (See eMAR);Emotional support   Therapy/Group: Individual Therapy  Kaylinn Dedic  Drayden Lukas,  PTA  01/05/2018, 3:37 PM

## 2018-01-05 NOTE — Progress Notes (Signed)
[place] PHYSICAL MEDICINE & REHABILITATION PROGRESS NOTE  Subjective/Complaints: Patient seen laying in bed this AM.  She states she slept well overnight.  She is alert and interactive.   ROS: Denies CP, shortness of breath, nausea, vomiting, diarrhea.  Objective: Vital Signs: Blood pressure (!) 145/73, pulse 65, temperature 97.6 F (36.4 C), temperature source Oral, resp. rate 17, height 5\' 5"  (1.651 m), weight 84.1 kg, SpO2 99 %. No results found. Recent Labs    01/04/18 0739  WBC 21.7*  HGB 13.9  HCT 42.7  PLT 430*   Recent Labs    01/04/18 0739  NA 129*  K 4.5  CL 94*  CO2 30  GLUCOSE 133*  BUN 32*  CREATININE 0.93  CALCIUM 9.0    Physical Exam: BP (!) 145/73 (BP Location: Left Arm)   Pulse 65   Temp 97.6 F (36.4 C) (Oral)   Resp 17   Ht 5\' 5"  (1.651 m)   Wt 84.1 kg   SpO2 99%   BMI 30.85 kg/m  Constitutional: No distress . Vital signs reviewed. HENT: Normocephalic.  Atraumatic. Eyes: EOMI.  No discharge. Cardiovascular: Irregularly irregular. No JVD. Respiratory: CTA bilaterally. Normal effort. GI: BS +. Non-distended. Musc: No edema or tenderness in extremities. Musculoskeletal: Generalized edema Neurological:  Alert and oriented x2 Dysarthria Motor: Limited due to participation RUE: 4/5 proximal to distal RLE: 3+/5 proximal to distal (?  Effort) LUE: 4/5 proximal to distal  LLE: 3-/5 proximal to distal   (? effort) Skin: Skin is warm and dry.  Scattered ecchymotic areas.   Psychiatric: Unable to assess due to mentation  Assessment/Plan: 1. Functional deficits secondary to right intraventricular hemorrhage which require 3+ hours per day of interdisciplinary therapy in a comprehensive inpatient rehab setting.  Physiatrist is providing close team supervision and 24 hour management of active medical problems listed below.  Physiatrist and rehab team continue to assess barriers to discharge/monitor patient progress toward functional and  medical goals  Care Tool:  Bathing    Body parts bathed by patient: Right arm, Left arm, Abdomen, Face, Chest, Front perineal area   Body parts bathed by helper: Buttocks, Left lower leg, Right lower leg, Left upper leg, Right upper leg     Bathing assist Assist Level: Maximal Assistance - Patient 24 - 49%     Upper Body Dressing/Undressing Upper body dressing   What is the patient wearing?: Pull over shirt    Upper body assist Assist Level: Maximal Assistance - Patient 25 - 49%    Lower Body Dressing/Undressing Lower body dressing      What is the patient wearing?: Pants     Lower body assist Assist for lower body dressing: 2 Helpers     Toileting Toileting    Toileting assist Assist for toileting: 2 Helpers     Transfers Chair/bed transfer  Transfers assist  Chair/bed transfer activity did not occur: Safety/medical concerns  Chair/bed transfer assist level: 2 Helpers     Locomotion Ambulation   Ambulation assist   Ambulation activity did not occur: Safety/medical concerns          Walk 10 feet activity   Assist  Walk 10 feet activity did not occur: Safety/medical concerns        Walk 50 feet activity   Assist Walk 50 feet with 2 turns activity did not occur: Safety/medical concerns         Walk 150 feet activity   Assist Walk 150 feet activity did not occur:  Safety/medical concerns         Walk 10 feet on uneven surface  activity   Assist Walk 10 feet on uneven surfaces activity did not occur: Safety/medical Armed forces technical officer activity did not occur: Safety/medical concerns         Wheelchair 50 feet with 2 turns activity    Assist    Wheelchair 50 feet with 2 turns activity did not occur: Safety/medical concerns       Wheelchair 150 feet activity     Assist Wheelchair 150 feet activity did not occur: Safety/medical concerns          Medical Problem  List and Plan: 1.  Visual field deficits with gaze preference, visual hallucinations, difficulty keeping eyes open,  pusher tendencies and left bias with standing secondary to large intraventricular hemorrhage   Cont CIR  Repeat head CT reviewed, with increase in edema, discussed with neurosurgery previously, no changes in management. Repeat head CT reviewed, stable.  Per Neurosurg, no intervention planned unless worsening CT.  Please see PA note.   Appreciate IM recs, signed off  IV steroids started on 11/11, increased on 11/12, decreased to 4 mg every 8 hours on 11/15, decreased to 4 mg twice daily on 11/18  Appreciate palliative care consult  Appears to have behavioral component at play with varying degrees of alertness as well as orientation and interactiveness at different times. 2.  DVT Prophylaxis/Anticoagulation: Mechanical: Sequential compression devices, below knee Bilateral lower extremities 3. Headaches/Pain Management: Limit sedating medications. Limit hydrocodone as needed for severe HA.   Topamax DC'd 4. Mood: LCSW to follow for evaluation and support when appropriate. Resumed home ativan prn at lower dose.   Provigil started on 11/14 5. Neuropsych: This patient is not capable of making decisions on her own behalf 6. Skin/Wound Care: Routine pressure relief measures 7. Fluids/Electrolytes/Nutrition: Monitor I's and O's.    D2 thins, advance diet as tolerated  Intake variable 8.  E. coli UTI: Completed 7 days of ctx/amoxicillin  Foley DC'd on 11/11 9.  HTN: Blood pressure goals less than 160/90.  Continue toprolol twice daily and increased Avapro to 300 mg on 11/13.   Metoprolol increased to 50 twice daily on 11/14  Hydralazine 10 3 times daily started on 11/17-noted allergy with itching, but no rash, will re-challenge.  ?Improving on 11/19 10.  Seizure prophylaxis: Continue Keppra twice daily, DC'd on 11/12. 11.  Urinary retention: Discontinued Ditropan.  Continue Flomax  routine pressure relief measures. 12.  Leukocytosis:   WBCs 21.7 on 11/18, confounded by steroids  Afebrile  Labs ordered for tomorrow  ABX per IM DC'd on 11/11  Continue to monitor 13. Hyponatremia:   Sodium 129 on 11/18  Labs ordered for tomorrow  IVF DC'd by IM, restarted on 11/12, d/ced on 11/13  Continue to monitor 14. H/o Barrett's esophagus/GERD: On Protonix 15.  Hypokalemia  Potassium 4.5 on 11/18  Labs ordered for tomorrow  Supplement increased on 11/1, decreased on 11/18 16.  Hypoalbuminemia  Supplement initiated on 11/7 17.  Acute blood loss anemia  Hemoglobin 10.8 on 11/11  Labs ordered for tomorrow 18.  Fevers and lethargy: Fevers resolved, lethargy persists -?  Behavioral, see above  Appreciate IM recs, signed off  Antibiotics DC'd 19.  Hypomagnesemia  Magnesium 1.7 on 11/13  Labs ordered for tomorrow  Continue supplementation  20. Steroid induced hyperglycemia   Cont  to monitor  LOS: 13 days A FACE TO FACE EVALUATION WAS PERFORMED  [name] [name] 01/05/2018, 9:07 AM

## 2018-01-05 NOTE — Progress Notes (Signed)
Speech Language Pathology Daily Session Note  Patient Details  Name: [name] MRN: [id number] Date of Birth: [birth year]  Today's Date: 01/05/2018 SLP Individual Time: 1303-1401 SLP Individual Time Calculation (min): 58 min  Short Term Goals: Week 2: SLP Short Term Goal 1 (Week 2): Pt will consume trials of dys 3 textures and thin liquids with min cues for use of swallowing precautions and minimal overt s/s of aspiration over three consecutive sessions prior to advancement.   SLP Short Term Goal 2 (Week 2): Pt will sustain her attention to basic, functional tasks for 5 minutes with mod cues for redirection.   SLP Short Term Goal 3 (Week 2): Pt will complete basic, familiar tasks with mod assist for functional problem solving.   SLP Short Term Goal 4 (Week 2): Pt will locate items to the left of midline in ~50% of opportunities during basic, familiar tasks with mod assist.    Skilled Therapeutic Interventions: Skilled ST services focused on swallow and cognitive skills. Pt was asleep upon entering room with head/chin tucked, requiring max A verbal cues to hold head in upright position for PO consumption. SLP facilitated PO consumption of dys 2 and dys 3 trials, pt demonstrated prolonged mastication and overall fatigue, no overt s/s aspiration. Pt required max A verbal cues for problem solving during self feeding and total to locate items at midline. Pt required max A verbal cues for sustained attention in 2 minute intervals and complained of neck pain, with eyes mostly remaining closed. Nursing provided pain medication. PT requested to get in bed and SLP notified nurse tech. Pt was left in room with call bell within reach and chair alarm set. Recommend to continue skilled ST services.     Pain Pain Assessment Pain Scale: 0-10 Pain Score: 4  Pain Type: Acute pain Pain Location: Neck Pain Intervention(s): RN made aware;Medication (See eMAR);Emotional support  Therapy/Group: Individual  Therapy  Ma Munoz  [facility] 01/05/2018, 2:05 PM

## 2018-01-05 NOTE — Progress Notes (Signed)
Late note: Had a long discussion with daughter yesterday afternoon about patient's current condition as well as realistic goals.  She did report that did not eat a lot at home and was on soft foods due to problem with her partial PTA. She is aware that patient will require a lot of care unless she starts making progress.

## 2018-01-06 ENCOUNTER — Inpatient Hospital Stay (HOSPITAL_COMMUNITY): Payer: Medicare Other | Admitting: Physical Therapy

## 2018-01-06 ENCOUNTER — Inpatient Hospital Stay (HOSPITAL_COMMUNITY): Payer: Medicare Other | Admitting: Occupational Therapy

## 2018-01-06 ENCOUNTER — Inpatient Hospital Stay (HOSPITAL_COMMUNITY): Payer: Medicare Other

## 2018-01-06 DIAGNOSIS — D72828 Other elevated white blood cell count: Secondary | ICD-10-CM

## 2018-01-06 LAB — CBC WITH DIFFERENTIAL/PLATELET
Abs Immature Granulocytes: 0.9 10*3/uL — ABNORMAL HIGH (ref 0.00–0.07)
BASOS ABS: 0.1 10*3/uL (ref 0.0–0.1)
BASOS PCT: 0 %
EOS PCT: 0 %
Eosinophils Absolute: 0.1 10*3/uL (ref 0.0–0.5)
HEMATOCRIT: 41.8 % (ref 36.0–46.0)
Hemoglobin: 13.6 g/dL (ref 12.0–15.0)
Immature Granulocytes: 5 %
LYMPHS ABS: 2.3 10*3/uL (ref 0.7–4.0)
LYMPHS PCT: 12 %
MCH: 28.9 pg (ref 26.0–34.0)
MCHC: 32.5 g/dL (ref 30.0–36.0)
MCV: 88.7 fL (ref 80.0–100.0)
MONO ABS: 2.6 10*3/uL — AB (ref 0.1–1.0)
Monocytes Relative: 14 %
Neutro Abs: 13.3 10*3/uL — ABNORMAL HIGH (ref 1.7–7.7)
Neutrophils Relative %: 69 %
PLATELETS: 387 10*3/uL (ref 150–400)
RBC: 4.71 MIL/uL (ref 3.87–5.11)
RDW: 13.9 % (ref 11.5–15.5)
WBC: 19.3 10*3/uL — ABNORMAL HIGH (ref 4.0–10.5)
nRBC: 0 % (ref 0.0–0.2)

## 2018-01-06 LAB — MAGNESIUM: Magnesium: 1.9 mg/dL (ref 1.7–2.4)

## 2018-01-06 LAB — BASIC METABOLIC PANEL
Anion gap: 9 (ref 5–15)
BUN: 32 mg/dL — AB (ref 8–23)
CALCIUM: 8.9 mg/dL (ref 8.9–10.3)
CHLORIDE: 93 mmol/L — AB (ref 98–111)
CO2: 27 mmol/L (ref 22–32)
Creatinine, Ser: 1.2 mg/dL — ABNORMAL HIGH (ref 0.44–1.00)
GFR calc Af Amer: 45 mL/min — ABNORMAL LOW (ref >60)
GFR calc non Af Amer: 39 mL/min — ABNORMAL LOW (ref >60)
Glucose, Bld: 110 mg/dL — ABNORMAL HIGH (ref 70–99)
Potassium: 4 mmol/L (ref 3.5–5.1)
SODIUM: 129 mmol/L — AB (ref 135–145)

## 2018-01-06 MED ORDER — RANITIDINE HCL 150 MG/10ML PO SYRP
150.0000 mg | ORAL_SOLUTION | Freq: Two times a day (BID) | ORAL | Status: DC
  Administered 2018-01-07 – 2018-01-17 (×22): 150 mg via ORAL
  Filled 2018-01-06 (×30): qty 10

## 2018-01-06 MED ORDER — MUSCLE RUB 10-15 % EX CREA
TOPICAL_CREAM | Freq: Four times a day (QID) | CUTANEOUS | Status: DC
  Administered 2018-01-06 – 2018-01-07 (×5): via TOPICAL
  Administered 2018-01-07: 1 via TOPICAL
  Administered 2018-01-07 – 2018-01-12 (×18): via TOPICAL
  Administered 2018-01-12 (×2): 1 via TOPICAL
  Administered 2018-01-13 – 2018-01-15 (×9): via TOPICAL
  Administered 2018-01-15: 1 via TOPICAL
  Administered 2018-01-15 – 2018-01-18 (×11): via TOPICAL
  Filled 2018-01-06 (×2): qty 85

## 2018-01-06 MED ORDER — ACETAMINOPHEN 325 MG PO TABS
650.0000 mg | ORAL_TABLET | Freq: Four times a day (QID) | ORAL | Status: DC
  Administered 2018-01-06 – 2018-01-18 (×45): 650 mg via ORAL
  Filled 2018-01-06 (×47): qty 2

## 2018-01-06 NOTE — Progress Notes (Signed)
[place] PHYSICAL MEDICINE & REHABILITATION PROGRESS NOTE  Subjective/Complaints: Patient seen lying in bed this morning.  Required sternal rub to awaken.  Limited interaction.  No reported issues overnight  ROS: Denies CP, shortness of breath, nausea, vomiting, diarrhea.  Objective: Vital Signs: Blood pressure 137/80, pulse 74, temperature (!) 97.5 F (36.4 C), temperature source Oral, resp. rate 19, height 5\' 5"  (1.651 m), weight 84.1 kg, SpO2 100 %. No results found. Recent Labs    01/04/18 0739 01/06/18 0538  WBC 21.7* 19.3*  HGB 13.9 13.6  HCT 42.7 41.8  PLT 430* 387   Recent Labs    01/04/18 0739 01/06/18 0538  NA 129* 129*  K 4.5 4.0  CL 94* 93*  CO2 30 27  GLUCOSE 133* 110*  BUN 32* 32*  CREATININE 0.93 1.20*  CALCIUM 9.0 8.9    Physical Exam: BP 137/80 (BP Location: Left Arm)   Pulse 74   Temp (!) 97.5 F (36.4 C) (Oral)   Resp 19   Ht 5\' 5"  (1.651 m)   Wt 84.1 kg   SpO2 100%   BMI 30.85 kg/m  Constitutional: No distress . Vital signs reviewed. HENT: Normocephalic.  Atraumatic. Eyes: EOMI.  No discharge. Cardiovascular: Irregularly irregular.  No JVD. Respiratory: CTA bilaterally.  Normal effort. GI: BS +. Non-distended. Musc: No edema or tenderness in extremities. Musculoskeletal: Generalized edema Neurological:  Somnolent Dysarthria  Previously: Motor: RUE: 4/5 proximal to distal RLE: 3+/5 proximal to distal (?Effort) LUE: 4/5 proximal to distal  LLE: 3-/5 proximal to distal   (?Effort) Skin: Skin is warm and dry.  Scattered ecchymotic areas.   Psychiatric: Unable to assess due to mentation  Assessment/Plan: 1. Functional deficits secondary to right intraventricular hemorrhage which require 3+ hours per day of interdisciplinary therapy in a comprehensive inpatient rehab setting.  Physiatrist is providing close team supervision and 24 hour management of active medical problems listed below.  Physiatrist and rehab team continue to  assess barriers to discharge/monitor patient progress toward functional and medical goals  Care Tool:  Bathing    Body parts bathed by patient: Right arm, Left arm, Abdomen, Face, Chest, Front perineal area   Body parts bathed by helper: Buttocks, Left lower leg, Right lower leg, Left upper leg, Right upper leg     Bathing assist Assist Level: Maximal Assistance - Patient 24 - 49%     Upper Body Dressing/Undressing Upper body dressing   What is the patient wearing?: Pull over shirt    Upper body assist Assist Level: Maximal Assistance - Patient 25 - 49%    Lower Body Dressing/Undressing Lower body dressing      What is the patient wearing?: Pants     Lower body assist Assist for lower body dressing: 2 Helpers     Toileting Toileting    Toileting assist Assist for toileting: 2 Helpers     Transfers Chair/bed transfer  Transfers assist  Chair/bed transfer activity did not occur: Safety/medical concerns  Chair/bed transfer assist level: Dependent - mechanical lift     Locomotion Ambulation   Ambulation assist   Ambulation activity did not occur: Safety/medical concerns          Walk 10 feet activity   Assist  Walk 10 feet activity did not occur: Safety/medical concerns        Walk 50 feet activity   Assist Walk 50 feet with 2 turns activity did not occur: Safety/medical concerns         Walk 150  feet activity   Assist Walk 150 feet activity did not occur: Safety/medical concerns         Walk 10 feet on uneven surface  activity   Assist Walk 10 feet on uneven surfaces activity did not occur: Safety/medical concerns         Wheelchair     Assist     Wheelchair activity did not occur: Safety/medical concerns         Wheelchair 50 feet with 2 turns activity    Assist    Wheelchair 50 feet with 2 turns activity did not occur: Safety/medical concerns       Wheelchair 150 feet activity     Assist  Wheelchair 150 feet activity did not occur: Safety/medical concerns          Medical Problem List and Plan: 1.  Visual field deficits with gaze preference, visual hallucinations, difficulty keeping eyes open,  pusher tendencies and left bias with standing secondary to large intraventricular hemorrhage   Cont CIR  Repeat head CT reviewed, with increase in edema, discussed with neurosurgery previously, no changes in management. Repeat head CT reviewed, stable.  Per Neurosurg, no intervention planned unless worsening CT.  Please see PA note.   Appreciate IM recs, signed off  IV steroids started on 11/11, increased on 11/12, decreased to 4 mg every 8 hours on 11/15, decreased to 4 mg twice daily on 11/18  Appreciate palliative care consult  Appears to have behavioral component at play with varying degrees of alertness as well as orientation and interactiveness at different times. 2.  DVT Prophylaxis/Anticoagulation: Mechanical: Sequential compression devices, below knee Bilateral lower extremities 3. Headaches/Pain Management: Limit sedating medications. Limit hydrocodone as needed for severe HA.   Topamax DC'd 4. Mood: LCSW to follow for evaluation and support when appropriate. Resumed home ativan prn at lower dose.   Provigil started on 11/14 5. Neuropsych: This patient is not capable of making decisions on her own behalf 6. Skin/Wound Care: Routine pressure relief measures 7. Fluids/Electrolytes/Nutrition: Monitor I's and O's.    D2 thins, advance diet as tolerated  Intake variable 8.  E. coli UTI: Completed 7 days of ctx/amoxicillin  Foley DC'd on 11/11 9.  HTN: Blood pressure goals less than 160/90.  Continue toprolol twice daily and increased Avapro to 300 mg on 11/13.   Metoprolol increased to 50 twice daily on 11/14  Hydralazine 10 3 times daily started on 11/17-noted allergy with itching, but no rash, will re-challenge.  Slightly labile on 11/20 10.  Seizure prophylaxis: Continue  Keppra twice daily, DC'd on 11/12. 11.  Urinary retention: Discontinued Ditropan.  Continue Flomax routine pressure relief measures. 12.  Leukocytosis:   WBCs 19.3 on 11/20, likely steroid-induced  Afebrile  ABX per IM DC'd on 11/11  Continue to monitor 13. Hyponatremia:   Sodium 129 on 11/20  IVF DC'd by IM, restarted on 11/12, d/ced on 11/13  Continue to monitor 14. H/o Barrett's esophagus/GERD: On Protonix 15.  Hypokalemia  Potassium 4.0 on 11/20  Supplement increased on 11/1, decreased on 11/18 16.  Hypoalbuminemia  Supplement initiated on 11/7 17.  Acute blood loss anemia: Resolved  Hemoglobin 13.6 on 11/20 18.  Fevers and lethargy: Fevers resolved, lethargy persists -?  Behavioral, see above  Appreciate IM recs, signed off  Antibiotics DC'd 19.  Hypomagnesemia  Magnesium 1.9 on 11/20  Continue supplementation  20. Steroid induced hyperglycemia   Cont to monitor  LOS: 14 days A FACE TO FACE EVALUATION WAS  PERFORMED  Alarik Radu [name] 01/06/2018, 12:59 PM

## 2018-01-06 NOTE — Progress Notes (Signed)
Occupational Therapy Session Note  Patient Details  Name: [name] MRN: [id number] Date of Birth: [birth year]  Today's Date: 01/06/2018 OT Individual Time: 1030-1100 OT Individual Time Calculation (min): 30 min    Short Term Goals: Week 1:  OT Short Term Goal 1 (Week 1): Pt will transition to EOB with max +2 OT Short Term Goal 1 - Progress (Week 1): Not met OT Short Term Goal 2 (Week 1): Pt will maintain EOB sitting balance with max +2 OT Short Term Goal 2 - Progress (Week 1): Not met OT Short Term Goal 3 (Week 1): Pt will complete grooming task at sink with mod A OT Short Term Goal 3 - Progress (Week 1): Not met OT Short Term Goal 4 (Week 1): Pt will don shirt with max A OT Short Term Goal 4 - Progress (Week 1): Not met Week 2:  OT Short Term Goal 1 (Week 2): Pt will maintain sustained attention for 15 mins to ADL task with no more than min instructional cueing to keep her eyes open.  OT Short Term Goal 2 (Week 2): Pt will complete UB bathing with mod instructional cueing and min assist in supported sitting.  OT Short Term Goal 3 (Week 2): Pt will sit EOB with mod assist for static sitting balance for at least 2 mins in preparation for selfcare tasks or transfers. OT Short Term Goal 4 (Week 2): Pt will perform sit to stand with max assist during LB selfcare tasks.  OT Short Term Goal 5 (Week 2): Pt will use the LUE as a gross assist with max hand over hand facilitation for washing the right arm during bathing tasks.       Skilled Therapeutic Interventions/Progress Updates:    Pt received in tilt in space w/c asleep.  Pt not waking up easily, chair tilted up and warm sweater donned for pt.  She did not open her eyes but did say yes that she would like to go for a "ride".  Pt taken to an area where she could be in the sun.  Pt did engage in some conversation but would not open her eyes. Pt taken back to her room. Her daughter was there and discussed pt's status.  Daughter aware that  pt's ability to participate is very limited. She can often participate in one session, but by the next session even when it is later pt is too fatigued to even open her eyes.  Daughter is aware and understands that her mom's participation is limited.    Therapy Documentation Precautions:  Precautions Precautions: Fall Precaution Comments: pushing left, decreased attention, left hemiparesis Restrictions Weight Bearing Restrictions: No   Pain: Pain Assessment Pain Score: 0-No pain   Pt did state she had some neck discomfort. Applied hot pack to neck with daughter with pt to supervise if pt felt hot pack was too heavy to pt.   Therapy/Group: Individual Therapy  Demarri Elie 01/06/2018, 12:45 PM

## 2018-01-06 NOTE — Progress Notes (Signed)
Physical Therapy Session Note  Patient Details  Name: [name] MRN: [id number] Date of Birth: 09-Jul-1926  Today's Date: 01/06/2018 PT Individual Time: 0900-0945 PT Individual Time Calculation (min): 45 min   Short Term Goals: Week 2:  PT Short Term Goal 1 (Week 2): Pt will perform bed WC transfer with Max assist of 1.  PT Short Term Goal 2 (Week 2): Pt Will initiate WC mobility training  PT Short Term Goal 3 (Week 2): Pt will maintain sitting balacne with moderate assist up to 5 minutes   Skilled Therapeutic Interventions/Progress Updates:   Pt received supine in bed and agreeable to PT, but pt reports significant fatigue and weakness. Pt noted to have severe cervical lateral flexion to the R. PT performed prolonged stretch to R upper traps 3 x 1 minute each bout. BP assessed in supine 108/69. PA then present to assess pt and strongly encouraged OOB activity to improve pt arousal.  Supine>sit transfer with  Total assist fom PT and cues for UE placement. Sitting EOB x 10  Minutes with mod assist from PT to maintain upright posture and correct posterior LOB. Only mild pushing tendencies this AM. Pt able to converse appropriately when sitting EOB, but continues to request to lay back down. BPP assessed in sitting 96/62. Sit>stand in stedy with total assist +2. Pt required total assist to maintain near upright posture. PT assessed in stedy 82/58. Pt noted to have decreased arousal and lack of vocalization in stedy. Total assist to transfer to [facility] And WC reclied into supine position with BLE elevated. BP re assessed in reclined 110/67, and pt arousal and alertness returned to base line. Pt left sitting in WC with call bell in reach, belt alarm in place and all needs met.       Therapy Documentation Precautions:  Precautions Precautions: Fall Precaution Comments: pushing left, decreased attention, left hemiparesis Restrictions Weight Bearing Restrictions: No    Vital Signs: Therapy  Vitals Pulse Rate: 74 BP: 137/80 Patient Position (if appropriate): Lying Oxygen Therapy SpO2: 100 % O2 Device: Room Air Pain: Pain Assessment Pain Scale: 0-10 Pain Score: 0-No pain    Therapy/Group: Individual Therapy  [name] 01/06/2018, 10:50 AM

## 2018-01-06 NOTE — Plan of Care (Signed)
  Problem: RH SKIN INTEGRITY Goal: RH STG MAINTAIN SKIN INTEGRITY WITH ASSISTANCE Description STG Maintain Skin Integrity With mod Assistance.  Outcome: Progressing  Continue to assess skin, skin tear healing foam dressing in place  Problem: RH SAFETY Goal: RH STG ADHERE TO SAFETY PRECAUTIONS W/ASSISTANCE/DEVICE Description STG Adhere to Safety Precautions With Assistance/Device. Mod  Outcome: Progressing  Call light within reach, bed/chair alarm, proper footwear,  Problem: RH PAIN MANAGEMENT Goal: RH STG PAIN MANAGED AT OR BELOW PT'S PAIN GOAL Description Less than 5  Outcome: Progressing  Assess pain, administered pain regimen as needed.

## 2018-01-06 NOTE — Progress Notes (Signed)
Speech Language Pathology Daily Session Note  Patient Details  Name: [name] MRN: [id number] Date of Birth: October 17, 1926  Today's Date: 01/06/2018 SLP Individual Time: 0800-0830 SLP Individual Time Calculation (min): 30 min  Short Term Goals: Week 2: SLP Short Term Goal 1 (Week 2): Pt will consume trials of dys 3 textures and thin liquids with min cues for use of swallowing precautions and minimal overt s/s of aspiration over three consecutive sessions prior to advancement.   SLP Short Term Goal 2 (Week 2): Pt will sustain her attention to basic, functional tasks for 5 minutes with mod cues for redirection.   SLP Short Term Goal 3 (Week 2): Pt will complete basic, familiar tasks with mod assist for functional problem solving.   SLP Short Term Goal 4 (Week 2): Pt will locate items to the left of midline in ~50% of opportunities during basic, familiar tasks with mod assist.    Skilled Therapeutic Interventions:Skilled ST services focused on swallow and cognitive skills. Nurse tech. Was feeding pt upon entering room, however pt did not seem alter eyes close, SLP provided education. SLP facilitated sustained attention, pt required Max A verbal/tatcile cues to keep eyes open and for sustained attention in 3 minute intervals during fucntional self-feeding task. SLP facilitated basic problem and initiation of task during self-feeding task, pt required max A verbal cues. Pt demonstrated PO consumption of mainly dys 1 items, with prolonged mastication and no overt s/s aspiration.Pyt only attend to right visual field. Pt was left in room with call bell within reach, nursing staff in room and bed alarm set. SLP reccomends to continue skilled services.     Pain Pain Assessment Pain Score: 0-No pain  Therapy/Group: Individual Therapy  Dayra Rapley  [facility] 01/06/2018, 4:15 PM

## 2018-01-06 NOTE — Progress Notes (Signed)
Daily Progress Note   Patient Name: [name]       Date: 01/06/2018 DOB: 11-Aug-1926  Age: 84 y.o. MRN#: [id number] Attending Physician: Jamse Arn, MD Primary Care Physician: Merrilee Seashore, MD Admit Date: 12/23/2017  Reason for Consultation/Follow-up: Establishing goals of care  Subjective: Patient is sitting in wheelchair with daughter at bedside. She does not like the food choices provided for her dietary restrictions. She does not like the tuna salad, and does not want green beans, or mashed potatoes. Family is attempting to plan for discharge to the SNF her husband is at. Recommend palliative to follow at D/C.     Length of Stay: 14  Current Medications: Scheduled Meds:  . acetaminophen  650 mg Oral Q6H  . calcium carbonate  1 tablet Oral Q breakfast  . cholecalciferol  1,000 Units Oral Daily  . dexamethasone  4 mg Intravenous Q12H  . docusate sodium  100 mg Oral BID  . feeding supplement  1 Container Oral TID WC  . feeding supplement (PRO-STAT SUGAR FREE 64)  30 mL Oral BID  . hydrALAZINE  10 mg Oral Q8H  . irbesartan  300 mg Oral Daily  . magnesium oxide  400 mg Oral BID  . mouth rinse  15 mL Mouth Rinse BID  . metoprolol tartrate  50 mg Oral BID  . modafinil  100 mg Oral Daily  . MUSCLE RUB   Topical Q6H  . pantoprazole  40 mg Oral Daily  . polyethylene glycol  17 g Oral Daily  . potassium chloride  30 mEq Oral Daily  . ranitidine  150 mg Oral BID  . tamsulosin  0.4 mg Oral Daily    Continuous Infusions:   PRN Meds: alum & mag hydroxide-simeth, bisacodyl, cloNIDine, guaiFENesin-dextromethorphan, lidocaine, nitroGLYCERIN, ondansetron (ZOFRAN) IV, polyethylene glycol, prochlorperazine **OR** prochlorperazine **OR** prochlorperazine, simethicone,  sodium phosphate  Physical Exam  Constitutional: No distress.  Pulmonary/Chest: Effort normal.  Neurological: She is alert.  Skin: Skin is warm and dry.            Vital Signs: BP 137/80 (BP Location: Left Arm)   Pulse 74   Temp (!) 97.5 F (36.4 C) (Oral)   Resp 19   Ht 5\' 5"  (1.651 m)   Wt 84.1 kg   SpO2 100%   BMI  30.85 kg/m  SpO2: SpO2: 100 % O2 Device: O2 Device: Room Air O2 Flow Rate:    Intake/output summary:   Intake/Output Summary (Last 24 hours) at 01/06/2018 1240 Last data filed at 01/06/2018 0900 Gross per 24 hour  Intake 692 ml  Output -  Net 692 ml   LBM: Last BM Date: 01/04/18 Baseline Weight: Weight: 84.1 kg Most recent weight: Weight: 84.1 kg       Palliative Assessment/Data: 40%    Flowsheet Rows     Most Recent Value  Intake Tab  Referral Department  -- [Rehab]  Unit at Time of Referral  Other (Comment)  Palliative Care Primary Diagnosis  Neurology  Date Notified  12/29/17  Palliative Care Type  New Palliative care  Reason for referral  Clarify Goals of Care, Counsel Regarding Hospice  Date of Admission  12/23/17  Date first seen by Palliative Care  12/29/17  # of days Palliative referral response time  0 Day(s)  # of days IP prior to Palliative referral  6  Clinical Assessment  Psychosocial & Spiritual Assessment  Palliative Care Outcomes      Patient Active Problem List   Diagnosis Date Noted  . Steroid-induced hyperglycemia   . Benign essential HTN   . Labile blood pressure   . Hypertensive crisis   . Cerebral edema (HCC)   . Hypomagnesemia   . Dysphagia, post-stroke   . Lethargy   . Acute blood loss anemia   . Hypoalbuminemia due to protein-calorie malnutrition ([place])   . ICH (intracerebral hemorrhage) ([place]) 12/23/2017  . History of Barrett's esophagus   . Urinary retention   . Seizure prophylaxis   . E. coli UTI   . Intraventricular hemorrhage ([place])   . Urinary tract infection without hematuria   . Coronary artery  disease involving native coronary artery of native heart without angina pectoris   . Pulmonary HTN ([place])   . Leukocytosis   . Intracranial hemorrhage ([place]) 12/18/2017  . Syncope 04/10/2015  . Sepsis secondary to UTI ([place]) 04/10/2015  . URI (upper respiratory infection) 04/10/2015  . UTI (lower urinary tract infection) 04/10/2015  . QT prolongation 10/12/2014  . Headache 09/02/2012  . Tricuspid valve regurgitation, moderate 08/18/2012  . Hypokalemia 08/16/2012  . Hyponatremia 08/16/2012  . Atrial fibrillation ([place])   . Hypertension   . Anxiety   . Mixed hyperlipidemia 09/11/2009  . GERD 08/30/2008  . CHEST PAIN 08/30/2008  . Coronary atherosclerosis 04/04/2008  . Syncope and collapse 04/04/2008    Palliative Care Assessment & Plan    Recommendations/Plan:   Recommend palliative at D/C.   Will shadow for decline.     Code Status:    Code Status Orders  (From admission, onward)         Start     Ordered   12/23/17 1656  Do not attempt resuscitation (DNR)  Continuous    Question Answer Comment  In the event of cardiac or respiratory ARREST Do not call a "code blue"   In the event of cardiac or respiratory ARREST Do not perform Intubation, CPR, defibrillation or ACLS   In the event of cardiac or respiratory ARREST Use medication by any route, position, wound care, and other measures to relive pain and suffering. May use oxygen, suction and manual treatment of airway obstruction as needed for comfort.      12/23/17 1655        Code Status History    Date Active Date Inactive Code Status Order  ID Comments User Context   12/23/2017 1655 12/23/2017 1655 DNR [id number]  Flora Lipps Inpatient   12/18/2017 1800 12/23/2017 1620 DNR [id number]  Traci Sermon, PA-C ED   04/10/2015 2047 04/13/2015 1542 Full Code [id number]  Etta Quill, DO ED   10/12/2014 1806 10/13/2014 1632 Full Code [id number]  Samella Parr, NP Inpatient    Advance Directive Documentation       Most Recent Value  Type of Advance Directive  Healthcare Power of Attorney, Living will  Pre-existing out of facility DNR order (yellow form or pink MOST form)  -  "MOST" Form in Place?  -       Prognosis:   Unable to determine  Discharge Planning:  [place] for rehab with Palliative care service follow-up   Thank you for allowing the Palliative Medicine Team to assist in the care of this patient.   Total Time 25 min Prolonged Time Billed no      Greater than 50%  of this time was spent counseling and coordinating care related to the above assessment and plan.  Asencion Gowda, NP  Please contact Palliative Medicine Team phone at [phone] for questions and concerns.

## 2018-01-07 ENCOUNTER — Inpatient Hospital Stay (HOSPITAL_COMMUNITY): Payer: Medicare Other | Admitting: Occupational Therapy

## 2018-01-07 ENCOUNTER — Inpatient Hospital Stay (HOSPITAL_COMMUNITY): Payer: Medicare Other | Admitting: Physical Therapy

## 2018-01-07 ENCOUNTER — Inpatient Hospital Stay (HOSPITAL_COMMUNITY): Payer: Medicare Other | Admitting: Speech Pathology

## 2018-01-07 MED ORDER — ADULT MULTIVITAMIN W/MINERALS CH
1.0000 | ORAL_TABLET | Freq: Every day | ORAL | Status: DC
  Administered 2018-01-07 – 2018-01-18 (×11): 1 via ORAL
  Filled 2018-01-07 (×11): qty 1

## 2018-01-07 MED ORDER — DEXAMETHASONE SODIUM PHOSPHATE 4 MG/ML IJ SOLN
2.0000 mg | Freq: Two times a day (BID) | INTRAMUSCULAR | Status: DC
  Administered 2018-01-07 – 2018-01-11 (×8): 2 mg via INTRAVENOUS
  Filled 2018-01-07 (×8): qty 0.5

## 2018-01-07 NOTE — Plan of Care (Signed)
  Problem: RH SKIN INTEGRITY Goal: RH STG MAINTAIN SKIN INTEGRITY WITH ASSISTANCE Description STG Maintain Skin Integrity With mod Assistance.  Outcome: Progressing  Assess skin every shift and after each incont episodes Problem: RH SAFETY Goal: RH STG ADHERE TO SAFETY PRECAUTIONS W/ASSISTANCE/DEVICE Description STG Adhere to Safety Precautions With Assistance/Device. Mod  Outcome: Progressing  Call light within reach, bed/chair alarm and proper footwear

## 2018-01-07 NOTE — Patient Care Conference (Signed)
Inpatient RehabilitationTeam Conference and Plan of Care Update Date: 01/06/2018   Time: 2:45 PM    Patient Name: [name]      Medical Record Number: [id number]  Date of Birth: February 17, 1927 Sex: Female         Room/Bed: 4M12C/4M12C-01 Payor Info: Payor: MEDICARE / Plan: MEDICARE PART A AND B / Product Type: *No Product type* /    Admitting Diagnosis: R IVH  Admit Date/Time:  12/23/2017  4:20 PM Admission Comments: No comment available   Primary Diagnosis:  <principal problem not specified> Principal Problem: <principal problem not specified>  Patient Active Problem List   Diagnosis Date Noted  . Steroid-induced hyperglycemia   . Benign essential HTN   . Labile blood pressure   . Hypertensive crisis   . Cerebral edema (HCC)   . Hypomagnesemia   . Dysphagia, post-stroke   . Lethargy   . Acute blood loss anemia   . Hypoalbuminemia due to protein-calorie malnutrition ([place])   . ICH (intracerebral hemorrhage) ([place]) 12/23/2017  . History of Barrett's esophagus   . Urinary retention   . Seizure prophylaxis   . E. coli UTI   . Intraventricular hemorrhage ([place])   . Urinary tract infection without hematuria   . Coronary artery disease involving native coronary artery of native heart without angina pectoris   . Pulmonary HTN ([place])   . Leukocytosis   . Intracranial hemorrhage ([place]) 12/18/2017  . Syncope 04/10/2015  . Sepsis secondary to UTI ([place]) 04/10/2015  . URI (upper respiratory infection) 04/10/2015  . UTI (lower urinary tract infection) 04/10/2015  . QT prolongation 10/12/2014  . Headache 09/02/2012  . Tricuspid valve regurgitation, moderate 08/18/2012  . Hypokalemia 08/16/2012  . Hyponatremia 08/16/2012  . Atrial fibrillation ([place])   . Hypertension   . Anxiety   . Mixed hyperlipidemia 09/11/2009  . GERD 08/30/2008  . CHEST PAIN 08/30/2008  . Coronary atherosclerosis 04/04/2008  . Syncope and collapse 04/04/2008    Expected Discharge Date: Expected Discharge  Date: (SNF)  Team Members Present: Physician leading conference: Dr. Delice Lesch Social Worker Present: Alfonse Alpers, LCSW Nurse Present: Benjie Karvonen, RN PT Present: Barrie Folk, PT;Rosita Dechalus, PTA OT Present: Willeen Cass, OT PPS Coordinator present : Daiva Nakayama, RN, CRRN     Current Status/Progress Goal Weekly Team Focus  Medical   Visual field deficits with gaze preference, visual hallucinations, difficulty keeping eyes open,  pusher tendencies and left bias with standing secondary to large intraventricular hemorrhage   Improve mobility, BP, electolytes, lethargy  See above   Bowel/Bladder   Incontinent x2. PVRq4-6. PVR's low  Remain free of pressure or moisture damage.  assess toileting qshift   Swallow/Nutrition/ Hydration   dys 2 and thin, full supervision, limited by alertness  Supervision A  dys 3 trials, but really focusing on PO consumption of dys 2 with alertness   ADL's   Pt's level of alertness fluctuates greatly from day to day or session to session.  When alert she can engage in simple conversation.  Overall, pt continues to need total A of 2 for transfers, LB self care.  She can use her L arm to wash her R arm and reach across her body.  Max A with UB self care.  Pt has been incontinent.   LTGs downgraded on 01/05/18 to mod A with grooming and UB bathing and sit balance, max A dressing; max sit to stand; stand balance, toileting, toilet transfer and shower transfer goals discontinued  sitting balance/  postural control to decrease burden of care with mobility, basic self care to facilitate attention and use of LUE    Mobility   maxA bed mobilty, maxA x 2 to dependent transfers, all sessions limited by alertness or lethargy  moderate assist overeall   sitting balance, sitting tolerance, global condidtioning, d/c planning    Communication             Safety/Cognition/ Behavioral Observations  Max A   min assist   basic task with reduced alertness   Pain   On  scheduled q4 tylenol. Denis pain but shows s/s of pain while providing assistance with ADL's.  Pt will show a decrease in grimace and gaurding while providing care.  assess pain manage ment qshift   Skin   skin tear to left fore arm.   No new skin impairments. Proper healing of current skin tear.  qshift skin assessments    Rehab Goals Patient on target to meet rehab goals: Yes Rehab Goals Revised: Pt's goals have been downgraded to mod/max A. *See Care Plan and progress notes for long and short-term goals.     Barriers to Discharge  Current Status/Progress Possible Resolutions Date Resolved   Physician    Medical stability;Behavior;Inaccessible home environment;Home environment access/layout;Lack of/limited family support     See above  Therapies, optimize HTN meds, follow labs, weaning IV steroids      Nursing                  PT                    OT                  SLP                SW                Discharge Planning/Teaching Needs:  Pt will likely need SNF transfer from CIR.  CSW to confirm this with pt's dtr.  defer to SNF vs. education for dtr   Team Discussion:  Pt with not major medical changes. IV steroids are being weaned this week.  Pt needs mod to max A for bed mobility to total A and uses the stedy or maxi move for transfers.  Pt's alertness varies.  Pt sat edge of bed with min to mod A, as her pushing is better.  Pt was orthostatic with PT today - MD discussed with PT.  OT tried to take pt outside but she didn't open her eyes.  Pt trialed on D2 and D3 and chewed for a while, but showed no concerns of swallowing difficulties.  Pt needs max A for cognition.  Pt is incontinent of bladder and bowel.  Revisions to Treatment Plan:  none    Continued Need for Acute Rehabilitation Level of Care: The patient requires daily medical management by a physician with specialized training in physical medicine and rehabilitation for the following conditions: Daily direction of  a multidisciplinary physical rehabilitation program to ensure safe treatment while eliciting the highest outcome that is of practical value to the patient.: Yes Daily medical management of patient stability for increased activity during participation in an intensive rehabilitation regime.: Yes Daily analysis of laboratory values and/or radiology reports with any subsequent need for medication adjustment of medical intervention for : Blood pressure problems;Neurological problems;Other   I attest that I was present, lead the team conference, and concur with the assessment  and plan of the team.   Paisley Grajeda, Silvestre Mesi 01/07/2018, 2:53 PM

## 2018-01-07 NOTE — Progress Notes (Signed)
Occupational Therapy Weekly Progress Note  Patient Details  Name: [name] MRN: [id number] Date of Birth: 03-16-1926  Beginning of progress report period: December 30, 2017 End of progress report period: January 07, 2018  Today's Date: 01/07/2018 OT Individual Time: 9833-8250 OT Individual Time Calculation (min): 47 min    Session Note:  Pt worked on bathing and dressing of her UB to start session.  Total assist for supine to sit EOB in preparation for selfcare.  Once siting up she was able to maintain static sitting balance with mod instructional cueing and min assist for one minute.  When bathing was performed EOB she needed max assist to maintain here balance.  Increased LOB in all directions except to the right, secondary to pushing away from this side.  Increased left neglect present as well as motor planning deficits as well, with pt demonstrating limited ability to remove her robe from the left arm when given max demonstrational cueing.  She needed max assist for donning pullover shirt secondary to this with total +2 for sit to stand to pull pants over hips after therapy tech donned them over her feet.  Total +2 (pt 20%) for stand pivot transfer to the wheelchair at end of session.  Pt left with call button and phone in reach and chair alarm belt in place.    Patient has met 3 of 5 short term goals.  [name] is making slow progress with OT at this time.  She needs mod assist for static sitting balance with max assist for dynamic sitting balance during selfcare tasks.  Frequent LOB forward, to the left and posteriorly is noted.  Her sustained attention level has improved to 1-2 mins with her being able to maintain her eyes open.  Right neglect with severe left visual field deficit is present as well.  She continues to need max demonstrational cueing for visual scanning to midline and to the left for locating bathing and dressing items as well as items on her food tray.  She is  spontaneously using the LUE at times for bathing tasks but also demonstrates decreased sensory awareness in the UE as well as the neglect. At times she will be holding something firmly in the left hand without any idea that she is actually holding onto something.  [name] still demonstrates severe pushing to the left side as well secondary to perceptual deficits.  She needs total assist +2 for all sit to stand with LB selfcare and for squat pivot transfers or stand pivot transfers to the right to the wheelchair or bedside commode.  Feel her progress with likely remain slow based on the significance of the CVA and her current limitations.  It is unlikely that her daughter can provide this amount of assist and she will likely need long term SNF rehab for follow-up.  Will continue with current OT POC with downgraded goals to reflect slower than expected progress.    Patient continues to demonstrate the following deficits: muscle weakness, impaired timing and sequencing, unbalanced muscle activation, motor apraxia, decreased coordination and decreased motor planning, decreased visual perceptual skills and field cut, decreased midline orientation, decreased attention to left and left side neglect, decreased attention, decreased awareness, decreased problem solving, decreased safety awareness, decreased memory and delayed processing and decreased sitting balance, decreased standing balance, decreased postural control, hemiplegia and decreased balance strategies and therefore will continue to benefit from skilled OT intervention to enhance overall performance with BADL.  Patient not progressing toward long  term goals.  See goal revision..  Continue plan of care.  OT Short Term Goals Week 3:  OT Short Term Goal 1 (Week 3): Continue working on [employer].   Skilled Therapeutic Interventions/Progress Updates:      Therapy Documentation Precautions:  Precautions Precautions: Fall Precaution Comments:  pushing left, decreased attention, left hemiparesis Restrictions Weight Bearing Restrictions: No   Pain: Pain Assessment Pain Scale: 0-10 Pain Score: Asleep Faces Pain Scale: Hurts a little bit Pain Type: Acute pain Pain Location: Generalized Pain Descriptors / Indicators: Discomfort Pain Onset: With Activity Pain Intervention(s): Repositioned;Emotional support ADL: See Care Plan Section for some details of ADL.      Therapy/Group: Individual Therapy  Sigmund Morera OTR/L 01/07/2018, 11:50 AM

## 2018-01-07 NOTE — Progress Notes (Signed)
Nutrition Follow-up  DOCUMENTATION CODES:   Non-severe (moderate) malnutrition in context of acute illness/injury, Obesity unspecified  INTERVENTION:   - Continue 30 ml Prostat BID, each supplement provides 100 kcals and 15 grams protein  - Continue soup BID with lunch and dinner meals  - Continue soy milk TID with meals  - Continue Boost Breeze po TID, each supplement provides 250 kcal and 9 grams of protein  -MVI with minerals daily  -Downgrade diet to dysphagia 2 diet with thin liquids (pureed meats) for ease of intake and energy conservation  NUTRITION DIAGNOSIS:   Moderate Malnutrition related to chronic illness(dementia) as evidenced by mild fat depletion, moderate fat depletion, mild muscle depletion, moderate muscle depletion.  Ongoing  GOAL:   Patient will meet greater than or equal to 90% of their needs  Progressing  MONITOR:   PO intake, Weight trends, I & O's, Labs, Skin  REASON FOR ASSESSMENT:   Consult Assessment of nutrition requirement/status  ASSESSMENT:   84 year old female with PMH significant for dementia, atrial fibrillation, pulmonary hypertension, hyperlipidemia, GERD, and Barrett's esophagus. Pt was admitted on 12/18/2017 with complaints of headache and blurry vision. CT of head showed intraventricular hemorrhage. Per Neurosurgery, no need for surgical intervention.  RD re-consulted for food preferences and to assist with meal planning.   No new wt available to assess.   Spoke with pt, who was sitting in wheelchair at time of visit. She complains of feeling weak. Observed meal tray- pt consumed 100% of eggs and sausage and 50% of applesauce. Pt complains of chewing harder textured foods, such as meat (this was also confirmed from SLP note yesterday). She shares that it takes her a long time of chew and often tires quickly when eating. Pt amenable to trying pureed meat with meals for ease of intake and to preserve energy conservation. Meal  completion remains variable; PO: 30-100%/   Pt reports that she "thinks" she is consuming supplements; noted unopened Boost Breeze supplement on computer table. Per MAR, pt is accepting Prostat and Boost Breeze supplements. Discussed with pt importance of good meal and supplement to promote healing and progression with therapy.    Called service response center regarding diet change and food preferences. RD also personally entered additional food preferences (dislikes mashed potatoes, tuna salad, and green beans) and entered/ modified meal orders to pt preference.   Palliative care team following; pt and family planning on SNF with palliative follow-up at discharge.   Labs reviewed: Na: 129.   Diet Order:   Diet Order            DIET DYS 2 Room service appropriate? Yes; Fluid consistency: Thin  Diet effective now              EDUCATION NEEDS:   Education needs have been addressed  Skin:  Skin Assessment: Skin Integrity Issues: Skin Integrity Issues:: Other (Comment) Other: left arm skin tear  Last BM:  01/04/18  Height:   Ht Readings from Last 1 Encounters:  12/23/17 5\' 5"  (1.651 m)    Weight:   Wt Readings from Last 1 Encounters:  12/23/17 84.1 kg    Ideal Body Weight:  56.82 kg  BMI:  Body mass index is 30.85 kg/m.  Estimated Nutritional Needs:   Kcal:  1500-1700  Protein:  85-100 grams  Fluid:  >/= 1.5 L    [name] A. Jimmye Norman, RD, LDN, CDE Pager: [phone] After hours Pager: [phone]

## 2018-01-07 NOTE — Progress Notes (Signed)
Speech Language Pathology Weekly Progress and Session Note  Patient Details  Name: [name] MRN: [id number] Date of Birth: 31-Dec-1926  Beginning of progress report period: December 31, 2017 End of progress report period: January 07, 2018  Today's Date: 01/07/2018 SLP Individual Time: 0700-0730 SLP Individual Time Calculation (min): 30 min  Short Term Goals: Week 2: SLP Short Term Goal 1 (Week 2): Pt will consume trials of dys 3 textures and thin liquids with min cues for use of swallowing precautions and minimal overt s/s of aspiration over three consecutive sessions prior to advancement.   SLP Short Term Goal 1 - Progress (Week 2): Not met SLP Short Term Goal 2 (Week 2): Pt will sustain her attention to basic, functional tasks for 5 minutes with mod cues for redirection.   SLP Short Term Goal 2 - Progress (Week 2): Not met SLP Short Term Goal 3 (Week 2): Pt will complete basic, familiar tasks with mod assist for functional problem solving.   SLP Short Term Goal 3 - Progress (Week 2): Not met SLP Short Term Goal 4 (Week 2): Pt will locate items to the left of midline in ~50% of opportunities during basic, familiar tasks with mod assist.   SLP Short Term Goal 4 - Progress (Week 2): Not met    New Short Term Goals: Week 3: SLP Short Term Goal 1 (Week 3): Pt will consume trials of dys 3 textures and thin liquids with Max cues for use of swallowing precautions and minimal overt s/s of aspiration.  SLP Short Term Goal 2 (Week 3): Pt will sustain her attention to basic, functional tasks for 5 minutes with Max A cues for redirection.   SLP Short Term Goal 3 (Week 3): Pt will complete basic, familiar tasks with Max assist for functional problem solving.   SLP Short Term Goal 4 (Week 3): Pt will locate items at midline in ~50% of opportunities during basic, familiar tasks with Max assist.    Weekly Progress Updates: Pt has made minimal progress towards STG and LTGs. As a result, she has  not met any STGs this reporting period. She continues to fluctuate between Total A to Max A for all ADLs tasks d/t cognitive impairments and lethargy which limits pt's ability to participate in tasks. Currently most skilled ST session have focused on task initiation within self-feeding tasks. When consuming dysphagia 2 textures, pt requires increased time but with trials of dysphagia 3, pt exhibits significantly increased mastication and has not been appropriate for diet upgrade. Pt has also been able to sustain attention for 2 to 3 minute intervals with Max A cues. As a result, pt's LTGs have been downgraded to reflect increased assistance level. Anticipate that pt will benefit from placement in SNF for further therapy.      Intensity: Minumum of 1-2 x/day, 30 to 90 minutes Frequency: Total of 15 hours over 7 days of combined therapies Duration/Length of Stay: 21-28 days  Treatment/Interventions: Cognitive remediation/compensation;Cueing hierarchy;Environmental controls;Functional tasks;Internal/external aids;Patient/family education   Daily Session  Skilled Therapeutic Interventions: Skilled treatment session focused on promoting sustained alertness for attempt at PO consumption of pt's breakfast. Despite Total A multimodal attempts at increased arousal (washing pt's face, re-posiitioning pt in bed, turning on all the lights, opening blinds) pt was not able to open eyes or indicate any level of alertness.  PO consumption not find at this time. Education provided to nursing.      General    Pain Pain Assessment Pain Scale:  0-10 Pain Score: Asleep  Therapy/Group: Individual Therapy  Jock Mahon 01/07/2018, 11:19 AM

## 2018-01-07 NOTE — Progress Notes (Signed)
Physical Therapy Session Note  Patient Details  Name: [name] MRN: [id number] Date of Birth: 23-Apr-1926  Today's Date: 01/07/2018 PT Individual Time: 1115-1205 PT Individual Time Calculation (min): 50 min   Short Term Goals: Week 2:  PT Short Term Goal 1 (Week 2): Pt will perform bed WC transfer with Max assist of 1.  PT Short Term Goal 2 (Week 2): Pt Will initiate WC mobility training  PT Short Term Goal 3 (Week 2): Pt will maintain sitting balacne with moderate assist up to 5 minutes   Skilled Therapeutic Interventions/Progress Updates:   Pt received sitting in WC and agreeable to PT. Pt transported to day room in TIS WC.   PT instructed pt in endurance reciprocal movement training, and force use of affected extremities on UBE 3 x 1 minute. Kinetron  3 x 1 min. Max cues/ assist to perform cervical rotation to attend to the L.  Attention,Task initiaiton, visual compensation training to eat lunch at high low table max cues to overcome apraxia and attend to the L visual field  Pt returned to room and performed maximove transfer to bed at dependent level. Sit>supine completed with maximove and left supine in bed with call bell in reach and all needs met.        Therapy Documentation Precautions:  Precautions Precautions: Fall Precaution Comments: pushing left, decreased attention, left hemiparesis Restrictions Weight Bearing Restrictions: No Vital Signs: Therapy Vitals Pulse Rate: 81 BP: (!) 104/55 Oxygen Therapy SpO2: 100 %   Therapy/Group: Individual Therapy  [name] 01/07/2018, 4:10 PM

## 2018-01-07 NOTE — Progress Notes (Signed)
[place] PHYSICAL MEDICINE & REHABILITATION PROGRESS NOTE  Subjective/Complaints: Patient seen sitting up in bed this morning.  Nursing attempting to work with patient.  Patient was limited interaction.  ROS: Limited due to cognition/behavior  Objective: Vital Signs: Blood pressure (!) 126/58, pulse 68, temperature 97.7 F (36.5 C), temperature source Oral, resp. rate 18, height 5\' 5"  (1.651 m), weight 84.1 kg, SpO2 100 %. No results found. Recent Labs    01/06/18 0538  WBC 19.3*  HGB 13.6  HCT 41.8  PLT 387   Recent Labs    01/06/18 0538  NA 129*  K 4.0  CL 93*  CO2 27  GLUCOSE 110*  BUN 32*  CREATININE 1.20*  CALCIUM 8.9    Physical Exam: BP (!) 126/58 (BP Location: Right Arm)   Pulse 68   Temp 97.7 F (36.5 C) (Oral)   Resp 18   Ht 5\' 5"  (1.651 m)   Wt 84.1 kg   SpO2 100%   BMI 30.85 kg/m  Constitutional: No distress . Vital signs reviewed. HENT: Normocephalic.  Atraumatic. Eyes: EOMI.  No discharge. Cardiovascular: Irregularly irregular.  No JVD. Respiratory: CTA bilaterally.  Normal effort. GI: BS +. Non-distended. Musc: No edema or tenderness in extremities. Musculoskeletal: Generalized edema Neurological:  Alert Dysarthria  Previously: Motor: RUE: 4/5 proximal to distal RLE: 3+/5 proximal to distal (?  Effort) LUE: 4/5 proximal to distal  LLE: 3-/5 proximal to distal   (?  Effort) Skin: Skin is warm and dry.  Scattered ecchymotic areas.   Psychiatric: Unable to assess due to mentation  Assessment/Plan: 1. Functional deficits secondary to right intraventricular hemorrhage which require 3+ hours per day of interdisciplinary therapy in a comprehensive inpatient rehab setting.  Physiatrist is providing close team supervision and 24 hour management of active medical problems listed below.  Physiatrist and rehab team continue to assess barriers to discharge/monitor patient progress toward functional and medical goals  Care Tool:  Bathing     Body parts bathed by patient: Right arm, Left arm, Abdomen, Face, Chest, Front perineal area   Body parts bathed by helper: Buttocks, Left lower leg, Right lower leg, Left upper leg, Right upper leg     Bathing assist Assist Level: Maximal Assistance - Patient 24 - 49%     Upper Body Dressing/Undressing Upper body dressing   What is the patient wearing?: Pull over shirt    Upper body assist Assist Level: Maximal Assistance - Patient 25 - 49%    Lower Body Dressing/Undressing Lower body dressing      What is the patient wearing?: Pants     Lower body assist Assist for lower body dressing: 2 Helpers     Toileting Toileting    Toileting assist Assist for toileting: 2 Helpers     Transfers Chair/bed transfer  Transfers assist  Chair/bed transfer activity did not occur: Safety/medical concerns  Chair/bed transfer assist level: Dependent - mechanical lift     Locomotion Ambulation   Ambulation assist   Ambulation activity did not occur: Safety/medical concerns          Walk 10 feet activity   Assist  Walk 10 feet activity did not occur: Safety/medical concerns        Walk 50 feet activity   Assist Walk 50 feet with 2 turns activity did not occur: Safety/medical concerns         Walk 150 feet activity   Assist Walk 150 feet activity did not occur: Safety/medical concerns  Walk 10 feet on uneven surface  activity   Assist Walk 10 feet on uneven surfaces activity did not occur: Safety/medical Armed forces technical officer activity did not occur: Safety/medical concerns         Wheelchair 50 feet with 2 turns activity    Assist    Wheelchair 50 feet with 2 turns activity did not occur: Safety/medical concerns       Wheelchair 150 feet activity     Assist Wheelchair 150 feet activity did not occur: Safety/medical concerns          Medical Problem List and Plan: 1.   Visual field deficits with gaze preference, visual hallucinations, difficulty keeping eyes open,  pusher tendencies and left bias with standing secondary to large intraventricular hemorrhage   Cont CIR  Repeat head CT reviewed, with increase in edema, discussed with neurosurgery previously, no changes in management. Repeat head CT reviewed, stable.  Per Neurosurg, no intervention planned unless worsening CT.  Please see PA note.   Appreciate IM recs, signed off  IV steroids started on 11/11, increased on 11/12, decreased to 4 mg every 8 hours on 11/15, decreased to 4 mg twice daily on 11/18, decreased to 2 mg on 11/21  Appreciate palliative care consult  Appears to have behavioral component at play with varying degrees of alertness as well as orientation and interactiveness at different times  Plan for SNF early next week. 2.  DVT Prophylaxis/Anticoagulation: Mechanical: Sequential compression devices, below knee Bilateral lower extremities 3. Headaches/Pain Management: Limit sedating medications. Limit hydrocodone as needed for severe HA.   Topamax DC'd 4. Mood: LCSW to follow for evaluation and support when appropriate. Resumed home ativan prn at lower dose.   Provigil started on 11/14 5. Neuropsych: This patient is not capable of making decisions on her own behalf 6. Skin/Wound Care: Routine pressure relief measures 7. Fluids/Electrolytes/Nutrition: Monitor I's and O's.    D2 thins, advance diet as tolerated  Intake variable 8.  E. coli UTI: Completed 7 days of ctx/amoxicillin  Foley DC'd on 11/11 9.  HTN: Blood pressure goals less than 160/90.  Continue toprolol twice daily and increased Avapro to 300 mg on 11/13.   Metoprolol increased to 50 twice daily on 11/14  Hydralazine 10 3 times daily started on 11/17-noted allergy with itching, but no rash, will re-challenge.  Relatively controlled on 11/21 10.  Seizure prophylaxis: Continue Keppra twice daily, DC'd on 11/12. 11.  Urinary  retention: Discontinued Ditropan.  Continue Flomax routine pressure relief measures. 12.  Leukocytosis:   WBCs 19.3 on 11/20, likely steroid-induced  Afebrile  ABX per IM DC'd on 11/11  Continue to monitor 13. Hyponatremia:   Sodium 129 on 11/20  IVF DC'd by IM, restarted on 11/12, d/ced on 11/13  Continue to monitor 14. H/o Barrett's esophagus/GERD: On Protonix 15.  Hypokalemia  Potassium 4.0 on 11/20  Supplement increased on 11/1, decreased on 11/18 16.  Hypoalbuminemia  Supplement initiated on 11/7 17.  Acute blood loss anemia: Resolved  Hemoglobin 13.6 on 11/20 18.  Fevers and lethargy: Fevers resolved, lethargy persists -?  Behavioral, see above  Appreciate IM recs, signed off  Antibiotics DC'd 19.  Hypomagnesemia  Magnesium 1.9 on 11/20  Continue supplementation  20. Steroid induced hyperglycemia   Improving with reduction in steroids  LOS: 15 days A FACE TO FACE EVALUATION WAS PERFORMED  [name] [name] 01/07/2018, 11:23  AM

## 2018-01-08 ENCOUNTER — Inpatient Hospital Stay (HOSPITAL_COMMUNITY): Payer: Medicare Other | Admitting: Physical Therapy

## 2018-01-08 ENCOUNTER — Inpatient Hospital Stay (HOSPITAL_COMMUNITY): Payer: Medicare Other | Admitting: Speech Pathology

## 2018-01-08 ENCOUNTER — Inpatient Hospital Stay (HOSPITAL_COMMUNITY): Payer: Medicare Other | Admitting: Occupational Therapy

## 2018-01-08 LAB — OSMOLALITY, URINE: Osmolality, Ur: 718 mOsm/kg (ref 300–900)

## 2018-01-08 LAB — OSMOLALITY: Osmolality: 283 mOsm/kg (ref 275–295)

## 2018-01-08 MED ORDER — INFLUENZA VAC SPLIT HIGH-DOSE 0.5 ML IM SUSY
0.5000 mL | PREFILLED_SYRINGE | INTRAMUSCULAR | Status: AC
  Administered 2018-01-09: 0.5 mL via INTRAMUSCULAR
  Filled 2018-01-08: qty 0.5

## 2018-01-08 NOTE — Progress Notes (Signed)
[place] PHYSICAL MEDICINE & REHABILITATION PROGRESS NOTE  Subjective/Complaints:  Constipated but no abd pain, no Nausea or vomiting  ROS: Limited due to cognition/behavior  Objective: Vital Signs: Blood pressure (!) 150/68, pulse 61, temperature 98.6 F (37 C), resp. rate 19, height 5\' 5"  (1.651 m), weight 71.4 kg, SpO2 99 %. No results found. Recent Labs    01/06/18 0538  WBC 19.3*  HGB 13.6  HCT 41.8  PLT 387   Recent Labs    01/06/18 0538  NA 129*  K 4.0  CL 93*  CO2 27  GLUCOSE 110*  BUN 32*  CREATININE 1.20*  CALCIUM 8.9    Physical Exam: BP (!) 150/68 (BP Location: Right Arm)   Pulse 61   Temp 98.6 F (37 C)   Resp 19   Ht 5\' 5"  (1.651 m)   Wt 71.4 kg   SpO2 99%   BMI 26.19 kg/m  Constitutional: No distress . Vital signs reviewed. HENT: Normocephalic.  Atraumatic. Eyes: EOMI.  No discharge. Cardiovascular: Irregularly irregular.  No JVD. Respiratory: CTA bilaterally.  Normal effort. GI: BS +. Non-distended. Musc: No edema or tenderness in extremities. Musculoskeletal: Generalized edema Neurological:  Alert Dysarthria  Previously: Motor: RUE: 4/5 proximal to distal RLE: 3+/5 proximal to distal (?  Effort) LUE: 4/5 proximal to distal  LLE: 2-/5 proximal to distal   (?  Effort) Skin: Skin is warm and dry.  Scattered ecchymotic areas.   Psychiatric: Unable to assess due to mentation  Assessment/Plan: 1. Functional deficits secondary to right intraventricular hemorrhage which require 3+ hours per day of interdisciplinary therapy in a comprehensive inpatient rehab setting.  Physiatrist is providing close team supervision and 24 hour management of active medical problems listed below.  Physiatrist and rehab team continue to assess barriers to discharge/monitor patient progress toward functional and medical goals  Care Tool:  Bathing    Body parts bathed by patient: Left arm, Chest, Abdomen, Face   Body parts bathed by helper: Buttocks,  Left lower leg, Right lower leg, Left upper leg, Right upper leg Body parts n/a: Right upper leg, Left upper leg, Right lower leg, Buttocks, Front perineal area, Left lower leg, Right arm   Bathing assist Assist Level: Maximal Assistance - Patient 24 - 49%     Upper Body Dressing/Undressing Upper body dressing   What is the patient wearing?: Pull over shirt    Upper body assist Assist Level: Maximal Assistance - Patient 25 - 49%    Lower Body Dressing/Undressing Lower body dressing      What is the patient wearing?: Pants     Lower body assist Assist for lower body dressing: 2 Helpers     Toileting Toileting    Toileting assist Assist for toileting: 2 Helpers     Transfers Chair/bed transfer  Transfers assist  Chair/bed transfer activity did not occur: Safety/medical concerns  Chair/bed transfer assist level: 2 Helpers     Locomotion Ambulation   Ambulation assist   Ambulation activity did not occur: Safety/medical concerns          Walk 10 feet activity   Assist  Walk 10 feet activity did not occur: Safety/medical concerns        Walk 50 feet activity   Assist Walk 50 feet with 2 turns activity did not occur: Safety/medical concerns         Walk 150 feet activity   Assist Walk 150 feet activity did not occur: Safety/medical concerns  Walk 10 feet on uneven surface  activity   Assist Walk 10 feet on uneven surfaces activity did not occur: Safety/medical Armed forces technical officer activity did not occur: Safety/medical concerns         Wheelchair 50 feet with 2 turns activity    Assist    Wheelchair 50 feet with 2 turns activity did not occur: Safety/medical concerns       Wheelchair 150 feet activity     Assist Wheelchair 150 feet activity did not occur: Safety/medical concerns          Medical Problem List and Plan: 1.  Visual field deficits with gaze  preference, visual hallucinations, difficulty keeping eyes open,  pusher tendencies and left bias with standing secondary to large intraventricular hemorrhage   Cont CIR  Repeat head CT reviewed, with increase in edema, discussed with neurosurgery previously, no changes in management. Repeat head CT reviewed, stable.  Per Neurosurg, no intervention planned unless worsening CT.  Please see PA note.   Appreciate IM recs, signed off  IV steroids started on 11/11, increased on 11/12, decreased to 4 mg every 8 hours on 11/15, decreased to 4 mg twice daily on 11/18, decreased to 2 mg on 11/21  Appreciate palliative care consult  Appears to have behavioral component at play with varying degrees of alertness as well as orientation and interactiveness at different times  Plan for SNF early next week. 2.  DVT Prophylaxis/Anticoagulation: Mechanical: Sequential compression devices, below knee Bilateral lower extremities 3. Headaches/Pain Management: Limit sedating medications. Limit hydrocodone as needed for severe HA.   Topamax DC'd 4. Mood: LCSW to follow for evaluation and support when appropriate. Resumed home ativan prn at lower dose.   Provigil started on 11/14 5. Neuropsych: This patient is not capable of making decisions on her own behalf 6. Skin/Wound Care: Routine pressure relief measures 7. Fluids/Electrolytes/Nutrition: Monitor I's and O's.    D2 thins, advance diet as tolerated  Intake variable 8.  E. coli UTI: Completed 7 days of ctx/amoxicillin  Foley DC'd on 11/11 9.  HTN: Blood pressure goals less than 160/90.  Continue toprolol twice daily and increased Avapro to 300 mg on 11/13.   Metoprolol increased to 50 twice daily on 11/14  Hydralazine 10 3 times daily started on 11/17-noted allergy with itching, but no rash, will re-challenge.  Relatively controlled on 11/21 Vitals:   01/07/18 2215 01/08/18 0441  BP: 133/80 (!) 150/68  Pulse: 75 61  Resp:  19  Temp: 98.3 F (36.8 C) 98.6  F (37 C)  SpO2: 98% 99%   10.  Seizure prophylaxis: Continue Keppra twice daily, DC'd on 11/12. 11.  Urinary retention: Discontinued Ditropan.  Continue Flomax routine pressure relief measures. 12.  Leukocytosis:   WBCs 19.3 on 11/20, likely steroid-induced , should come down as steroids are tapered  Afebrile  ABX per IM DC'd on 11/11  Continue to monitor 13. Hyponatremia:   Sodium 129 on 11/20- no diuretic   IVF DC'd by IM, restarted on 11/12, d/ced on 11/13  Continue to monitor 14. H/o Barrett's esophagus/GERD: On Protonix 15.  Hypokalemia  Potassium 4.0 on 11/20  Supplement increased on 11/1, decreased on 11/18 16.  Hypoalbuminemia  Supplement initiated on 11/7 17.  Acute blood loss anemia: Resolved  Hemoglobin 13.6 on 11/20 18.  Fevers and lethargy: Fevers resolved, lethargy persists -?  Behavioral, see above  Appreciate  IM recs, signed off  Antibiotics DC'd 19.  Hypomagnesemia  Magnesium 1.9 on 11/20  Continue supplementation  20. Steroid induced hyperglycemia   Improving with reduction in steroids No CBGs, fasting glucose 110 on 11/20  LOS: 16 days A FACE TO FACE EVALUATION WAS PERFORMED  Charlett Blake 01/08/2018, 7:26 AM

## 2018-01-08 NOTE — Progress Notes (Signed)
Occupational Therapy Session Note  Patient Details  Name: [name] MRN: [id number] Date of Birth: Oct 04, 1926  Today's Date: 01/08/2018 OT Individual Time: 0800-0900 OT Individual Time Calculation (min): 60 min    Short Term Goals: Week 3:  OT Short Term Goal 1 (Week 3): Continue working on [employer].   Skilled Therapeutic Interventions/Progress Updates:   Pt completed bathing in supine during session for safety as no +2 assist was available during session.  Total assist for all rolling including max instructional cueing for sequencing bending up her LE and trying to reach across to assist.  She was able to wash her face and front peri area in supine with mod instructional cueing.  Total assist needed for all other LB bathing as well as all dressing from supine.  Pt left in bed at end of session to rest with call button and phone in reach.     Therapy Documentation Precautions:  Precautions Precautions: Fall Precaution Comments: pushing left, decreased attention, left hemiparesis Restrictions Weight Bearing Restrictions: No  Pain: Pain Assessment Pain Scale: 0-10 Pain Score: 0-No pain Pain Type: Acute pain Pain Location: Generalized Pain Descriptors / Indicators: Discomfort Pain Onset: With Activity Pain Intervention(s): Repositioned ADL:  Therapy/Group: Individual Therapy  Wave Calzada OTR/L 01/08/2018, 12:17 PM

## 2018-01-08 NOTE — Progress Notes (Signed)
Social Work Patient ID: [name], female   DOB: February 25, 1926, 84 y.o.   MRN: [id number]   CSW met with pt on 01-07-18 to update them her on team conference discussion and tried to meet up with dtr, but CSW kept missing her when she was visiting, so voice message left for dtr to call CSW back to update her as well.  CSW will discuss with dtr the plan for pt upon leaving CIR.  Pt is being weaned from steroids and after this will be medically stable for transfer to SNF, if this is dtr's wish.  CSW will continue to follow and assist as needed.

## 2018-01-08 NOTE — Progress Notes (Signed)
Speech Language Pathology Daily Session Note  Patient Details  Name: [name] MRN: [id number] Date of Birth: 1927-01-18  Today's Date: 01/08/2018 SLP Individual Time: 1135-1200 SLP Individual Time Calculation (min): 25 min  Short Term Goals: Week 3: SLP Short Term Goal 1 (Week 3): Pt will consume trials of dys 3 textures and thin liquids with Max cues for use of swallowing precautions and minimal overt s/s of aspiration.  SLP Short Term Goal 2 (Week 3): Pt will sustain her attention to basic, functional tasks for 5 minutes with Max A cues for redirection.   SLP Short Term Goal 3 (Week 3): Pt will complete basic, familiar tasks with Max assist for functional problem solving.   SLP Short Term Goal 4 (Week 3): Pt will locate items at midline in ~50% of opportunities during basic, familiar tasks with Max assist.    Skilled Therapeutic Interventions:  Pt was seen for skilled ST targeting dysphagia goals.  Upon arrival, pt was partially reclined in bed, awake, but verbalizing feeling upset about her family.  Pt mentioned Medicare enrollment when explaining the reason for being upset but it was unclear the exact reason due to pt's intermittent language of confusion.  Pt consumed dys 2 textures and thin liquids on her lunch tray with increased timeliness in oral phase when compared to previous sessions; however, PO intake still remains minimal and pt ate ~25% of her meal only.  Pt was more attentive to her meal but still needed cues to attend to the left side of her plate.  Pt was left in bed with bed alarm set and call bell within reach.  Continue per current plan of care.    Pain Pain Assessment Pain Scale: 0-10 Pain Score: 0-No pain  Therapy/Group: Individual Therapy  Rosalee Tolley, Selinda Orion 01/08/2018, 4:08 PM

## 2018-01-09 ENCOUNTER — Inpatient Hospital Stay (HOSPITAL_COMMUNITY): Payer: Medicare Other | Admitting: Physical Therapy

## 2018-01-09 NOTE — Progress Notes (Signed)
Physical Therapy Weekly Progress Note  Patient Details  Name: [name] MRN: [id number] Date of Birth: October 06, 1926  Beginning of progress report period: January 01, 2018 End of progress report period: January 08, 2018  Today's Date: 01/09/2018 PT Individual Time: 5993-5701 PT Individual Time Calculation (min): 40 min   Patient has met 1 of 3 short term goals.  Pt has continued to make slower than expected progress towards long term goals. inconsistent arousal, pushers syndrome, and severe perceptual deficits limit increased progress at this time. Pt has improved sitting balance and is mildly improved in alertness/arousal at various times throughout the day, but remains inconsistent day to day.   Patient continues to demonstrate the following deficits muscle weakness and muscle joint tightness, decreased cardiorespiratoy endurance, impaired timing and sequencing, abnormal tone, unbalanced muscle activation, motor apraxia, decreased coordination and decreased motor planning, decreased visual acuity, decreased visual perceptual skills, decreased visual motor skills, field cut and hemianopsia, decreased midline orientation, decreased attention to left, left side neglect, decreased motor planning and ideational apraxia, decreased initiation, decreased attention, decreased awareness, decreased problem solving, decreased safety awareness, decreased memory and delayed processing and decreased sitting balance, decreased standing balance, decreased postural control, hemiplegia and decreased balance strategies and therefore will continue to benefit from skilled PT intervention to increase functional independence with mobility.   Patient not progressing toward long term goals.  See goal revision..  Continue plan of care.  PT Short Term Goals Week 2:  PT Short Term Goal 1 (Week 2): Pt will perform bed WC transfer with Max assist of 1.  PT Short Term Goal 1 - Progress (Week 2): Progressing toward  goal PT Short Term Goal 2 (Week 2): Pt Will initiate WC mobility training  PT Short Term Goal 2 - Progress (Week 2): Not met PT Short Term Goal 3 (Week 2): Pt will maintain sitting balacne with moderate assist up to 5 minutes  PT Short Term Goal 3 - Progress (Week 2): Met Week 3:  PT Short Term Goal 1 (Week 3): SGT=LTG due to ELOS   Skilled Therapeutic Interventions/Progress Updates:   Pt received supine in bed and agreeable to PT after significant effort to arouse. Pt noted to answer yes no questions but only able to keep eyes open once for roughly 10 seconds. Maxi move transfer to [facility] with total +2 assist for safety. Pt transported to rehab gym in [place]. PT treatment session focused on arousal, attention, and visual scanning to the L. Pt required to to locate and name color 4 bean bags from R to L. Max multimodal cues for visual scanning to the R. Pt able to hold eyes open throughout entire activity and look beyond midline sporadically following cues from PT. Patient returned to room and left sitting in [facility] with call bell in reach and all needs met.           Therapy Documentation Precautions:  Precautions Precautions: Fall Precaution Comments: pushing left, decreased attention, left hemiparesis Restrictions Weight Bearing Restrictions: No   Pain: Pain Assessment Pain Scale: 0-10 Pain Score: 0-No pain   Therapy/Group: Individual Therapy  [name] 01/09/2018, 11:32 AM

## 2018-01-09 NOTE — Progress Notes (Signed)
Physical Therapy Session Note  Patient Details  Name: [name] MRN: [id number] Date of Birth: 1926-11-25  Today's Date: 01/08/2018 PT Individual Time:  1000-1055   22mn   Short Term Goals: Week 2:  PT Short Term Goal 1 (Week 2): Pt will perform bed WC transfer with Max assist of 1.  PT Short Term Goal 2 (Week 2): Pt Will initiate WC mobility training  PT Short Term Goal 3 (Week 2): Pt will maintain sitting balacne with moderate assist up to 5 minutes    Skilled Therapeutic Interventions/Progress Updates:   Pt received supine in bed and agreeable to PT. Pt reports need to urination and possibly bowel movement. Rolling to the L with mod assist and heavy use of rail to place bed pan. Pt positoined in semirecumbent position to improved stimulation of Bowels. Mod assist to roll to the r to remove bed pan. Sit>supine with max assist from PT. Sitting balance EOB with min assist fading to mod assist after 10 minutes. Sit<>stand in stedy on 3rd attempt with max assist. Pt able to maintain standing ~ 15 seconds with total-max assist from PT. Sit>supine completed with total assist and left supine in bed with call bell in reach and all needs met.        Therapy Documentation Precautions:  Precautions Precautions: Fall Precaution Comments: pushing left, decreased attention, left hemiparesis Restrictions Weight Bearing Restrictions: No    Vital Signs: Therapy Vitals Temp: 97.7 F (36.5 C) Temp Source: Oral Pulse Rate: (!) 59 Resp: 17 BP: (!) 156/95 Patient Position (if appropriate): Lying Oxygen Therapy SpO2: 98 % O2 Device: Room Air Pain: denies   Therapy/Group: Individual Therapy  ALorie Phenix11/23/2019, 7:53 AM

## 2018-01-09 NOTE — Plan of Care (Signed)
Pt long term goals down graded due to continued lack progress due to lethargy, fatigue, pushers syndrome, and poor awareness of deficits due to perceptual limitations. See care plan for details

## 2018-01-09 NOTE — Progress Notes (Addendum)
[name] is a 84 y.o. female 1926-09-23 [id number]  Subjective: Asleep. No new problems.   Objective: Vital signs in last 24 hours: Temp:  [97.5 F (36.4 C)-97.7 F (36.5 C)] 97.7 F (36.5 C) (11/23 0530) Pulse Rate:  [52-68] 59 (11/23 0530) Resp:  [16-18] 17 (11/23 0530) BP: (124-156)/(54-95) 156/95 (11/23 0530) SpO2:  [98 %-100 %] 98 % (11/23 0530) Weight change:  Last BM Date: 01/08/18  Intake/Output from previous day: 11/22 0701 - 11/23 0700 In: 720 [P.O.:720] Out: 300 [Urine:300] Last cbgs: CBG (last 3)  No results for input(s): GLUCAP in the last 72 hours.   Physical Exam General: No apparent distress   HEENT: not dry Lungs: Normal effort. Lungs clear to auscultation, no crackles or wheezes. Cardiovascular: Regular rate and rhythm, no edema Abdomen: S/NT/ND; BS(+) Musculoskeletal:  unchanged Neurological: No new neurological deficits Wounds: N/A    Skin: clear  Aging changes Mental state: Asleep    Lab Results: BMET    Component Value Date/Time   NA 129 (L) 01/06/2018 0538   NA 138 09/08/2016 1233   K 4.0 01/06/2018 0538   CL 93 (L) 01/06/2018 0538   CO2 27 01/06/2018 0538   GLUCOSE 110 (H) 01/06/2018 0538   BUN 32 (H) 01/06/2018 0538   BUN 13 09/08/2016 1233   CREATININE 1.20 (H) 01/06/2018 0538   CALCIUM 8.9 01/06/2018 0538   GFRNONAA 39 (L) 01/06/2018 0538   GFRAA 45 (L) 01/06/2018 0538   CBC    Component Value Date/Time   WBC 19.3 (H) 01/06/2018 0538   RBC 4.71 01/06/2018 0538   HGB 13.6 01/06/2018 0538   HCT 41.8 01/06/2018 0538   HCT 29.2 (L) 12/26/2017 0610   PLT 387 01/06/2018 0538   MCV 88.7 01/06/2018 0538   MCH 28.9 01/06/2018 0538   MCHC 32.5 01/06/2018 0538   RDW 13.9 01/06/2018 0538   LYMPHSABS 2.3 01/06/2018 0538   MONOABS 2.6 (H) 01/06/2018 0538   EOSABS 0.1 01/06/2018 0538   BASOSABS 0.1 01/06/2018 0538    Studies/Results: No results found.  Medications: I have reviewed the patient's current  medications.  Assessment/Plan:  1.  Large intraventricular hemorrhage s/p.  CIR.  Possible CNF discharge next week 2.  DVT prophylaxis with SCDs 3.  Headaches.  On Topamax for prophylaxis 4.  Dysphagia.  D2 thins 5.  UTI -- antibiotic completed 6.  Hyponatremia.  Monitoring electrolytes 7.  Hypoalbuminemia.  On supplemental nutrition 8.  Anemia.  Resolved 9.  Fevers and lethargy.  Fevers resolved on Provigil for lethargy 10.  Steroid-induced hyperglycemia.  Monitoring CBG 11.  Seizure prophylaxis with Keppra 12.  GERD/Barrett's.  Continue with Protonix       Length of stay, days: [place] , MD 01/09/2018, 12:30 PM

## 2018-01-10 ENCOUNTER — Inpatient Hospital Stay (HOSPITAL_COMMUNITY): Payer: Medicare Other | Admitting: *Deleted

## 2018-01-10 NOTE — Progress Notes (Signed)
[name] is a 84 y.o. female 1927-02-04 [id number]  Subjective: No new complaints. No new problems. Asleep  Objective: Vital signs in last 24 hours: Temp:  [97.6 F (36.4 C)-98.2 F (36.8 C)] 97.6 F (36.4 C) (11/24 0537) Pulse Rate:  [52-66] 61 (11/24 0537) Resp:  [16-18] 18 (11/24 0537) BP: (123-151)/(67-78) 151/67 (11/24 0537) SpO2:  [98 %-99 %] 99 % (11/24 0537) Weight change:  Last BM Date: 01/10/18  Intake/Output from previous day: 11/23 0701 - 11/24 0700 In: 50 [P.O.:780] Out: 600 [Urine:600] Last cbgs: CBG (last 3)  No results for input(s): GLUCAP in the last 72 hours.   Physical Exam General: No apparent distress   HEENT: not dry Lungs: Normal effort. Lungs clear to auscultation, no crackles or wheezes. Cardiovascular: Regular rate and rhythm, no edema Abdomen: S/NT/ND; BS(+) Musculoskeletal:  unchanged Neurological: No new neurological deficits Wounds: N/A    Skin: clear  Aging changes Mental state: Asleep    Lab Results: BMET    Component Value Date/Time   NA 129 (L) 01/06/2018 0538   NA 138 09/08/2016 1233   K 4.0 01/06/2018 0538   CL 93 (L) 01/06/2018 0538   CO2 27 01/06/2018 0538   GLUCOSE 110 (H) 01/06/2018 0538   BUN 32 (H) 01/06/2018 0538   BUN 13 09/08/2016 1233   CREATININE 1.20 (H) 01/06/2018 0538   CALCIUM 8.9 01/06/2018 0538   GFRNONAA 39 (L) 01/06/2018 0538   GFRAA 45 (L) 01/06/2018 0538   CBC    Component Value Date/Time   WBC 19.3 (H) 01/06/2018 0538   RBC 4.71 01/06/2018 0538   HGB 13.6 01/06/2018 0538   HCT 41.8 01/06/2018 0538   HCT 29.2 (L) 12/26/2017 0610   PLT 387 01/06/2018 0538   MCV 88.7 01/06/2018 0538   MCH 28.9 01/06/2018 0538   MCHC 32.5 01/06/2018 0538   RDW 13.9 01/06/2018 0538   LYMPHSABS 2.3 01/06/2018 0538   MONOABS 2.6 (H) 01/06/2018 0538   EOSABS 0.1 01/06/2018 0538   BASOSABS 0.1 01/06/2018 0538    Studies/Results: No results found.  Medications: I have reviewed the patient's  current medications.  Assessment/Plan:   1.  Large intraventricular hemorrhage.  CIR. 2.  DVT prophylaxis with SCDs 3.  Headaches.  On Topamax for prophylaxis 4.  Dysphagia.  On T2 thins diet 5.  UTI.  Antibiotic was completed 6.  Hyponatremia.  We will continue to monitor electrolytes 7.  Hypoalbuminemia.  On supplemental nutrition 8.  Anemia.  Resolved 9.  Fevers and lethargy.  Fevers resolved.  She is on Provigil for lethargy 10.  Steroid-induced hyperglycemia.  Will monitor CBG 11.  Seizure prophylaxis with Keppra 12.  GERD/Barrett's esophagus.  Continue with chronic       Length of stay, days: 18  Walker Kehr , MD 01/10/2018, 1:22 PM

## 2018-01-11 ENCOUNTER — Inpatient Hospital Stay (HOSPITAL_COMMUNITY): Payer: Medicare Other

## 2018-01-11 ENCOUNTER — Inpatient Hospital Stay (HOSPITAL_COMMUNITY): Payer: Medicare Other | Admitting: Physical Therapy

## 2018-01-11 ENCOUNTER — Inpatient Hospital Stay (HOSPITAL_COMMUNITY): Payer: Medicare Other | Admitting: Occupational Therapy

## 2018-01-11 LAB — CBC
HCT: 40.7 % (ref 36.0–46.0)
Hemoglobin: 13.4 g/dL (ref 12.0–15.0)
MCH: 29.3 pg (ref 26.0–34.0)
MCHC: 32.9 g/dL (ref 30.0–36.0)
MCV: 88.9 fL (ref 80.0–100.0)
NRBC: 0 % (ref 0.0–0.2)
PLATELETS: 202 10*3/uL (ref 150–400)
RBC: 4.58 MIL/uL (ref 3.87–5.11)
RDW: 14 % (ref 11.5–15.5)
WBC: 18.2 10*3/uL — AB (ref 4.0–10.5)

## 2018-01-11 LAB — BASIC METABOLIC PANEL
Anion gap: 7 (ref 5–15)
BUN: 36 mg/dL — ABNORMAL HIGH (ref 8–23)
CALCIUM: 8.6 mg/dL — AB (ref 8.9–10.3)
CO2: 25 mmol/L (ref 22–32)
CREATININE: 0.74 mg/dL (ref 0.44–1.00)
Chloride: 98 mmol/L (ref 98–111)
Glucose, Bld: 98 mg/dL (ref 70–99)
Potassium: 5.3 mmol/L — ABNORMAL HIGH (ref 3.5–5.1)
SODIUM: 130 mmol/L — AB (ref 135–145)

## 2018-01-11 MED ORDER — MAGNESIUM OXIDE 400 (241.3 MG) MG PO TABS
200.0000 mg | ORAL_TABLET | Freq: Two times a day (BID) | ORAL | Status: DC
  Administered 2018-01-11 – 2018-01-18 (×13): 200 mg via ORAL
  Filled 2018-01-11 (×13): qty 1

## 2018-01-11 NOTE — Progress Notes (Signed)
Social Work Patient ID: [name], female   DOB: May 25, 1926, 84 y.o.   MRN: [id number]   CSW was able to talk with pt and her dtr today to discuss last week's team conference, including the slow progress pt has made.  Pt's dtr realizes that pt will likely need more care than she can provide in the home and knows she will need further rehab after CIR time is completed.  CSW explained that we are nearing that time, pending the results of pt's tests today.  We agreed to await these results and talk tomorrow to make a plan moving forward.  CSW will begin preparing information to send to area SNFs in anticipation of pt's tx to SNF soon.  CSW remains available to assist as needed.

## 2018-01-11 NOTE — Progress Notes (Signed)
Occupational Therapy Session Note  Patient Details  Name: [name] MRN: [id number] Date of Birth: 02-22-1926  Today's Date: 01/11/2018 OT Individual Time: 9833-8250 OT Individual Time Calculation (min): 58 min    Short Term Goals: Week 3:  OT Short Term Goal 1 (Week 3): Continue working on [employer].   Skilled Therapeutic Interventions/Progress Updates:    Pt asleep in bed to start session.  She would not open eyes to stimulus either.  She did take her medications orally with nursing assist prior to transfer to the EOB.  Total assist to transition from supine to sitting EOB.  Once sitting she exhibited the need for overall max assist for static sitting balance. She did exhibit static sitting balance at times with min to mod assist.  She did demonstrate forward trunk flexion with constant resistance to balance correction in order to not fall forward.  When attempting to have pt use either hand for washing, she needed max assist and would not continue the movement to assist with any washing or dressing.  Total +2 (ot 10%) for partial sit to stand and standing in order for second therapist to assist with washing peri area and pulling brief up.  During donning of pants in sitting pt opened her eyes with fixed blank stare to the left.  She was then unable to respond to therapist's questions.  Quickly transitioned pt to supine with +2 assist and notified nursing.  Pt placed in slight trendelenberg position with BP checked at 134/70 episode lasted approximately 2 mins before pt began to respond briefly to the therapist.  Still she remained with eyes closed and positioned in bed at end of session.  Limited participation during this session.     Therapy Documentation Precautions:  Precautions Precautions: Fall Precaution Comments: pushing left, decreased attention, left hemiparesis Restrictions Weight Bearing Restrictions: No  Vital Signs: Therapy Vitals Pulse Rate: (!) 58 BP:  134/70 Patient Position (if appropriate): Lying Pain: Pain Assessment Pain Scale: Faces Pain Score: 0-No pain Faces Pain Scale: Hurts a little bit Pain Type: Acute pain Pain Location: Head Pain Descriptors / Indicators: Discomfort;Moaning Pain Onset: With Activity Patients Stated Pain Goal: 2 Pain Intervention(s): Medication (See eMAR) ADL: See Care Tool Section for some details  Therapy/Group: Individual Therapy  Addiel Mccardle OTR/L 01/11/2018, 12:17 PM

## 2018-01-11 NOTE — Progress Notes (Signed)
Speech Language Pathology Daily Session Note  Patient Details  Name: [name] MRN: [id number] Date of Birth: [birth year]  Today's Date: 01/11/2018 SLP Individual Time: 1000-1015 SLP Individual Time Calculation (min): 15 min  Short Term Goals: Week 3: SLP Short Term Goal 1 (Week 3): Pt will consume trials of dys 3 textures and thin liquids with Max cues for use of swallowing precautions and minimal overt s/s of aspiration.  SLP Short Term Goal 2 (Week 3): Pt will sustain her attention to basic, functional tasks for 5 minutes with Max A cues for redirection.   SLP Short Term Goal 3 (Week 3): Pt will complete basic, familiar tasks with Max assist for functional problem solving.   SLP Short Term Goal 4 (Week 3): Pt will locate items at midline in ~50% of opportunities during basic, familiar tasks with Max assist.    Skilled Therapeutic Interventions: #1 Skilled ST services focused on cognitive skills. SLP facilitated alertness with cold compress, sternal rub and repositioning in bed, however pt unable to open eyes or verbalize, only light moans. SLP communicated with nursing staff and reported ability to consume medication, no PO consumption of meals and reported insistence this morning justifying further test for investigation of possible seizer activity. Pt missed 15 minutes of session. Pt was left in room with call bell within reach and bed alarm set. Recommend to continue skilled ST services.   #2 SLP attempted to see pt at 12:00 however out of room for procedure.      Pain Pain Assessment Pain Scale: Faces Pain Score: 0-No pain Faces Pain Scale: Hurts a little bit Pain Type: Acute pain Pain Location: Head Patients Stated Pain Goal: 2 Pain Intervention(s): Medication (See eMAR)  Therapy/Group: Individual Therapy  Amaris Delafuente  [facility] 01/11/2018, 12:06 PM

## 2018-01-11 NOTE — Progress Notes (Signed)
RN called to room while patient was in OT. Upon arrival patient was lying with eyes closed in bed. Pt did respond minimally with a slight head nod. OT reported that patient had two minute episode of unresponsiveness in which the patient's gaze was fixed to left. Pt's blood pressure and pulse are within baseline. MD notified of these changes. Will continue to monitor.

## 2018-01-11 NOTE — Progress Notes (Signed)
[facility] PHYSICAL MEDICINE & REHABILITATION PROGRESS NOTE  Subjective/Complaints: Patient seen laying in bed this morning.  Limited interaction.  Informed by nursing regarding episode with therapies for patient was alert and interactive and then became unresponsive with eyes fixed to left field, which spontaneously resolved.  ROS: Limited due to cognition  Objective: Vital Signs: Blood pressure (!) 171/87, pulse 68, temperature 97.6 F (36.4 C), temperature source Oral, resp. rate 18, height 5\' 5"  (1.651 m), weight 71.4 kg, SpO2 99 %. No results found. Recent Labs    01/11/18 0732  WBC 18.2*  HGB 13.4  HCT 40.7  PLT 202   Recent Labs    01/11/18 0732  NA 130*  K 5.3*  CL 98  CO2 25  GLUCOSE 98  BUN 36*  CREATININE 0.74  CALCIUM 8.6*    Physical Exam: BP (!) 171/87 (BP Location: Right Arm)   Pulse 68   Temp 97.6 F (36.4 C) (Oral)   Resp 18   Ht 5\' 5"  (1.651 m)   Wt 71.4 kg   SpO2 99%   BMI 26.19 kg/m  Constitutional: No distress . Vital signs reviewed. HENT: Normocephalic.  Atraumatic. Eyes: EOMI.  No discharge. Cardiovascular: Irregularly irregular.  No JVD. Respiratory: CTA bilaterally.  Normal effort. GI: BS +. Non-distended. Musc: No edema or tenderness in extremities. Musculoskeletal: Generalized edema Neurological:  Lethargic Dysarthria  Previously: Motor: RUE: 4/5 proximal to distal RLE: 3+/5 proximal to distal (?  Effort) LUE: 4/5 proximal to distal  LLE: 2-/5 proximal to distal   (?  Effort) Skin: Skin is warm and dry.  Scattered ecchymotic areas.   Psychiatric: Unable to assess due to mentation  Assessment/Plan: 1. Functional deficits secondary to right intraventricular hemorrhage which require 3+ hours per day of interdisciplinary therapy in a comprehensive inpatient rehab setting.  Physiatrist is providing close team supervision and 24 hour management of active medical problems listed below.  Physiatrist and rehab team continue to  assess barriers to discharge/monitor patient progress toward functional and medical goals  Care Tool:  Bathing    Body parts bathed by patient: Face   Body parts bathed by helper: Front perineal area, Buttocks, Right upper leg, Left upper leg, Right lower leg, Left lower leg Body parts n/a: Right upper leg, Left upper leg, Right lower leg, Buttocks, Front perineal area, Left lower leg, Right arm   Bathing assist Assist Level: Maximal Assistance - Patient 24 - 49%     Upper Body Dressing/Undressing Upper body dressing   What is the patient wearing?: Pull over shirt    Upper body assist Assist Level: Total Assistance - Patient < 25%    Lower Body Dressing/Undressing Lower body dressing      What is the patient wearing?: Pants     Lower body assist Assist for lower body dressing: Total Assistance - Patient < 25%     Toileting Toileting    Toileting assist Assist for toileting: 2 Helpers     Transfers Chair/bed transfer  Transfers assist  Chair/bed transfer activity did not occur: Safety/medical concerns  Chair/bed transfer assist level: Dependent - mechanical lift     Locomotion Ambulation   Ambulation assist   Ambulation activity did not occur: Safety/medical concerns          Walk 10 feet activity   Assist  Walk 10 feet activity did not occur: Safety/medical concerns        Walk 50 feet activity   Assist Walk 50 feet with 2 turns  activity did not occur: Safety/medical concerns         Walk 150 feet activity   Assist Walk 150 feet activity did not occur: Safety/medical concerns         Walk 10 feet on uneven surface  activity   Assist Walk 10 feet on uneven surfaces activity did not occur: Safety/medical concerns         Wheelchair     Assist Will patient use wheelchair at discharge?: Yes Type of Wheelchair: Manual Wheelchair activity did not occur: Safety/medical concerns  Wheelchair assist level: Dependent -  Patient 0% Max wheelchair distance: 100    Wheelchair 50 feet with 2 turns activity    Assist    Wheelchair 50 feet with 2 turns activity did not occur: Safety/medical concerns   Assist Level: Dependent - Patient 0%   Wheelchair 150 feet activity     Assist Wheelchair 150 feet activity did not occur: Safety/medical concerns          Medical Problem List and Plan: 1.  Visual field deficits with gaze preference, visual hallucinations, difficulty keeping eyes open,  pusher tendencies and left bias with standing secondary to large intraventricular hemorrhage   Cont CIR  Repeat head CT reviewed, with increase in edema, discussed with neurosurgery previously, no changes in management. Repeat head CT reviewed, stable.  Per Neurosurg, no intervention planned unless worsening CT.  Please see PA note.   Appreciate IM recs, signed off  IV steroids started on 11/11, increased on 11/12, decreased to 4 mg every 8 hours on 11/15, decreased to 4 mg twice daily on 11/18, decreased to 2 mg on 11/21, DC'd on 11/25  Appreciate palliative care consult  Appears to have behavioral component at play with varying degrees of alertness as well as orientation and interactiveness at different times  Plan for SNF   Head CT, EEG ordered  Weekend notes reviewed- stable 2.  DVT Prophylaxis/Anticoagulation: Mechanical: Sequential compression devices, below knee Bilateral lower extremities 3. Headaches/Pain Management: Limit sedating medications. Limit hydrocodone as needed for severe HA.   Topamax DC'd 4. Mood: LCSW to follow for evaluation and support when appropriate. Resumed home ativan prn at lower dose.   Provigil started on 11/14 5. Neuropsych: This patient is not capable of making decisions on her own behalf 6. Skin/Wound Care: Routine pressure relief measures 7. Fluids/Electrolytes/Nutrition: Monitor I's and O's.    D2 thins, advance diet as tolerated  Intake variable 8.  E. coli UTI:  Completed 7 days of ctx/amoxicillin  Foley DC'd on 11/11 9.  HTN: Blood pressure goals less than 160/90.  Continue toprolol twice daily and increased Avapro to 300 mg on 11/13.   Metoprolol increased to 50 twice daily on 11/14  Hydralazine 10 3 times daily started on 11/17 Vitals:   01/10/18 2309 01/11/18 0543  BP: (!) 123/55 (!) 171/87  Pulse:  68  Resp: 16 18  Temp:  97.6 F (36.4 C)  SpO2:  99%   Labile on 11/25 10.  Seizure prophylaxis: Continue Keppra twice daily, DC'd on 11/12. 11.  Urinary retention: Discontinued Ditropan.  Continue Flomax routine pressure relief measures. 12.  Leukocytosis:   WBCs 18.2 on 11/25, likely steroid-induced  Afebrile  ABX per IM DC'd on 11/11  Continue to monitor 13. Hyponatremia:   Sodium 130 and 11/25  IVF DC'd by IM, restarted on 11/12, d/ced on 11/13  Continue to monitor 14. H/o Barrett's esophagus/GERD: On Protonix 15.  Hypokalemia  Potassium 5.3 on 11/25  Labs ordered for tomorrow  Supplement increased on 11/1, decreased on 11/18 16.  Hypoalbuminemia  Supplement initiated on 11/7 17.  Acute blood loss anemia: Resolved 18.  Fevers and lethargy: Fevers resolved, lethargy persists -?  Behavioral, see above  Appreciate IM recs, signed off  Antibiotics DC'd 19.  Hypomagnesemia  Magnesium 1.9 on 11/20  Continue supplementation  20. Steroid induced hyperglycemia   Improving with steroid wean  LOS: 19 days A FACE TO FACE EVALUATION WAS PERFORMED  Oasis Goehring [name] 01/11/2018, 10:33 AM

## 2018-01-11 NOTE — Progress Notes (Signed)
Physical Therapy Session Note  Patient Details  Name: MERA GUNKEL MRN: [id number] Date of Birth: [birth year]  Today's Date: 01/11/2018 PT Individual Time: 9983-3825 PT Individual Time Calculation (min): 23 min   Short Term Goals: Week 3:  PT Short Term Goal 1 (Week 3): SGT=LTG due to ELOS   Skilled Therapeutic Interventions/Progress Updates: Pt presented in bed with dgt present. Pt not aroused with voice or gentle touch. Pt did arouse with brief sternal rub. PTA placed wash cloth to pt's face with pt becoming more responsive. Pt able to follow commands for tracking to R with max multimodal cues and following daughter's voice. Pt with limited participation of ankle pumps, heel slides, and hip abd/add with PTA requiring max multimodal cues to maintain pt's arousal. Pt eventually closing both eyes and limited response with stimulation. Pt left in bed with dgt and LSW present.      Therapy Documentation Precautions:  Precautions Precautions: Fall Precaution Comments: pushing left, decreased attention, left hemiparesis Restrictions Weight Bearing Restrictions: No General: PT Amount of Missed Time (min): 37 Minutes Vital Signs:  Pain: Pain Assessment Pain Score: 0-No pain  Therapy/Group: Individual Therapy  Tylen Leverich 01/11/2018, 3:51 PM

## 2018-01-11 NOTE — Progress Notes (Signed)
EEG Completed; Results Pending  

## 2018-01-11 NOTE — Procedures (Signed)
History: 84 year old female being evaluated for an episode of left gaze  Sedation: None  Technique: This is a 21 channel routine scalp EEG performed at the bedside with bipolar and monopolar montages arranged in accordance to the international 10/20 system of electrode placement. One channel was dedicated to EKG recording.    Background: There is a moderately sustained, moderately well organized posterior dominant rhythm of 9 Hz.  There is generalized irregular delta and theta activities throughout the study.  Photic stimulation: Physiologic driving is not performed  EEG Abnormalities: 1) generalized irregular delta and theta activity   Clinical Interpretation: This EEG is consistent with a generalized non-specific cerebral dysfunction(encephalopathy). There was no seizure or seizure predisposition recorded on this study. Please note that a normal EEG does not preclude the possibility of epilepsy.   Roland Rack, MD [facility] [phone]  If 7pm- 7am, please page neurology on call as listed in [place].

## 2018-01-12 ENCOUNTER — Inpatient Hospital Stay (HOSPITAL_COMMUNITY): Payer: Medicare Other | Admitting: Occupational Therapy

## 2018-01-12 ENCOUNTER — Inpatient Hospital Stay (HOSPITAL_COMMUNITY): Payer: Medicare Other

## 2018-01-12 LAB — BASIC METABOLIC PANEL
Anion gap: 10 (ref 5–15)
BUN: 38 mg/dL — ABNORMAL HIGH (ref 8–23)
CALCIUM: 9 mg/dL (ref 8.9–10.3)
CO2: 24 mmol/L (ref 22–32)
CREATININE: 0.98 mg/dL (ref 0.44–1.00)
Chloride: 97 mmol/L — ABNORMAL LOW (ref 98–111)
GFR calc non Af Amer: 51 mL/min — ABNORMAL LOW (ref >60)
GFR, EST AFRICAN AMERICAN: 59 mL/min — AB (ref >60)
Glucose, Bld: 150 mg/dL — ABNORMAL HIGH (ref 70–99)
Potassium: 3.9 mmol/L (ref 3.5–5.1)
SODIUM: 131 mmol/L — AB (ref 135–145)

## 2018-01-12 MED ORDER — LEVETIRACETAM 500 MG PO TABS
500.0000 mg | ORAL_TABLET | Freq: Two times a day (BID) | ORAL | Status: DC
  Administered 2018-01-12 – 2018-01-14 (×4): 500 mg via ORAL
  Filled 2018-01-12 (×4): qty 1

## 2018-01-12 NOTE — Progress Notes (Signed)
Speech Language Pathology Daily Session Note  Patient Details  Name: [name] MRN: [id number] Date of Birth: 26-Apr-1926  Today's Date: 01/12/2018 SLP Individual Time: 0900-0930 SLP Individual Time Calculation (min): 30 min  Short Term Goals: Week 3: SLP Short Term Goal 1 (Week 3): Pt will consume trials of dys 3 textures and thin liquids with Max cues for use of swallowing precautions and minimal overt s/s of aspiration.  SLP Short Term Goal 2 (Week 3): Pt will sustain her attention to basic, functional tasks for 5 minutes with Max A cues for redirection.   SLP Short Term Goal 3 (Week 3): Pt will complete basic, familiar tasks with Max assist for functional problem solving.   SLP Short Term Goal 4 (Week 3): Pt will locate items at midline in ~50% of opportunities during basic, familiar tasks with Max assist.    Skilled Therapeutic Interventions: Skilled ST services focused on swallow and cognitive skills. Pt was awake and alert. Music was playing in the background, pt was able to name artist and provide information about the ban upon questioning. Pt did not recall events from yesterday only being very weak. SLP facilitated PO consumption of dys 1 (oatmeal) and thin via straw, demonstrating decrease oral holding time. SLP facilitated basic problem solving and attention to midline visual field, pt required max A verbal cues to identify "problem" in large picture cards and brought attention to midline to view cards with 70% accuracy given max A verbal and visual cues. Pt was left in room with call bell within reach and chair alarm set. SLP reccomends to continue skilled services.     Pain Pain Assessment Pain Scale: 0-10 Pain Score: 0-No pain  Therapy/Group: Individual Therapy  Asah Lamay  [facility] 01/12/2018, 12:04 PM

## 2018-01-12 NOTE — Progress Notes (Signed)
Occupational Therapy Session Note  Patient Details  Name: [name] MRN: [id number] Date of Birth: January 26, 1927  Today's Date: 01/12/2018 OT Individual Time: 0930-1000 OT Individual Time Calculation (min): 30 min    Short Term Goals: Week 1:  OT Short Term Goal 1 (Week 1): Pt will transition to EOB with max +2 OT Short Term Goal 1 - Progress (Week 1): Not met OT Short Term Goal 2 (Week 1): Pt will maintain EOB sitting balance with max +2 OT Short Term Goal 2 - Progress (Week 1): Not met OT Short Term Goal 3 (Week 1): Pt will complete grooming task at sink with mod A OT Short Term Goal 3 - Progress (Week 1): Not met OT Short Term Goal 4 (Week 1): Pt will don shirt with max A OT Short Term Goal 4 - Progress (Week 1): Not met Week 2:  OT Short Term Goal 1 (Week 2): Pt will maintain sustained attention for 15 mins to ADL task with no more than min instructional cueing to keep her eyes open.  OT Short Term Goal 1 - Progress (Week 2): Met OT Short Term Goal 2 (Week 2): Pt will complete UB bathing with mod instructional cueing and min assist in supported sitting.  OT Short Term Goal 2 - Progress (Week 2): Met OT Short Term Goal 3 (Week 2): Pt will sit EOB with mod assist for static sitting balance for at least 2 mins in preparation for selfcare tasks or transfers. OT Short Term Goal 3 - Progress (Week 2): Not met OT Short Term Goal 4 (Week 2): Pt will perform sit to stand with max assist during LB selfcare tasks.  OT Short Term Goal 4 - Progress (Week 2): Not met OT Short Term Goal 5 (Week 2): Pt will use the LUE as a gross assist with max hand over hand facilitation for washing the right arm during bathing tasks.  OT Short Term Goal 5 - Progress (Week 2): Met Week 3:  OT Short Term Goal 1 (Week 3): Continue working on [employer].   Skilled Therapeutic Interventions/Progress Updates:    Pt received in w/c and pt woke easily. Pt agreeable to working on grooming tasks of cleaning  dentures, washing face, brushing gums and hair all with mod A.  Pt was alert and paying attention well to tasks.  Pt taken to gym and worked  On B hand holds on small therapy ball for trunk rotation with max A to guide movement.  Pt taken back to room and adjusted in TIS chair to sleep. Pt with all needs met and NT aware pt in room.  Therapy Documentation Precautions:  Precautions Precautions: Fall Precaution Comments: pushing left, decreased attention, left hemiparesis Restrictions Weight Bearing Restrictions: No  Pain: Pain Assessment Pain Scale: 0-10 Pain Score: 0-No pain   Therapy/Group: Individual Therapy  SAGUIER,JULIA 01/12/2018, 12:12 PM

## 2018-01-12 NOTE — NC FL2 (Signed)
[place] LEVEL OF CARE SCREENING TOOL     IDENTIFICATION  Patient Name: [name] Birthdate: 04-28-1926 Sex: female Admission Date (Current Location): 12/23/2017  [facility] and [place] Number:  Herbalist and Address:  [facility] [place]. [facility], [place] [address] [place], [place] [address]      Provider Number: [id number]  Attending Physician Name and Address:  Jamse Arn, MD  Relative Name and Phone Number:       Current Level of Care: Other (Comment)(Inpatient Rehabilitation) Recommended Level of Care: [place] Prior Approval Number:    Date Approved/Denied:   PASRR Number: [id number]  Discharge Plan: SNF    Current Diagnoses: Patient Active Problem List   Diagnosis Date Noted  . Steroid-induced hyperglycemia   . Benign essential HTN   . Labile blood pressure   . Hypertensive crisis   . Cerebral edema (HCC)   . Hypomagnesemia   . Dysphagia, post-stroke   . Lethargy   . Acute blood loss anemia   . Hypoalbuminemia due to protein-calorie malnutrition ([place])   . ICH (intracerebral hemorrhage) ([place]) 12/23/2017  . History of Barrett's esophagus   . Urinary retention   . Seizure prophylaxis   . E. coli UTI   . Intraventricular hemorrhage ([place])   . Urinary tract infection without hematuria   . Coronary artery disease involving native coronary artery of native heart without angina pectoris   . Pulmonary HTN ([place])   . Leukocytosis   . Intracranial hemorrhage ([place]) 12/18/2017  . Syncope 04/10/2015  . Sepsis secondary to UTI ([place]) 04/10/2015  . URI (upper respiratory infection) 04/10/2015  . UTI (lower urinary tract infection) 04/10/2015  . QT prolongation 10/12/2014  . Headache 09/02/2012  . Tricuspid valve regurgitation, moderate 08/18/2012  . Hypokalemia 08/16/2012  . Hyponatremia 08/16/2012  . Atrial fibrillation ([place])   . Hypertension   . Anxiety   . Mixed hyperlipidemia 09/11/2009  . GERD  08/30/2008  . CHEST PAIN 08/30/2008  . Coronary atherosclerosis 04/04/2008  . Syncope and collapse 04/04/2008    Orientation RESPIRATION BLADDER Height & Weight     Self, Situation, Place  Normal Incontinent Weight: 71.4 kg Height:  5\' 5"  (165.1 cm)  BEHAVIORAL SYMPTOMS/MOOD NEUROLOGICAL BOWEL NUTRITION STATUS      Incontinent Diet(Dys 2; thin liquids)  AMBULATORY STATUS COMMUNICATION OF NEEDS Skin   Total Care Verbally(weak voice) Other (Comment)(skin tear to left fore arm; air mattress to prevent skin breakdown as pt cannot relieve pressure points on her own)                       Personal Care Assistance Level of Assistance  Bathing, Feeding, Dressing Bathing Assistance: Maximum assistance Feeding assistance: Limited assistance Dressing Assistance: Maximum assistance     Functional Limitations Info  Speech, Sight(Blurry vision; weak voice) Sight Info: Impaired(blurry vision)   Speech Info: Impaired(weak voice)    SPECIAL CARE FACTORS FREQUENCY  Blood pressure, PT (By licensed PT), OT (By licensed OT), Bowel and bladder program, Restorative feeding program, Speech therapy Blood Pressure Frequency: once daily   PT Frequency: 5x/week OT Frequency: 5x/week Bowel and Bladder Program Frequency: timed toileting to promote continence; check PVRs q 4-6 hours Restorative Feeding Program Frequency: progress pt to Dys3 Speech Therapy Frequency: 3-5X/week      Contractures Contractures Info: Not present    Additional Factors Info  Code Status, Allergies Code Status Info: DNR Allergies Info: Amlodipine, Methyldopa, Actonel, Ciprofloxacin Hcl,  Hctz, Hydrolazine Hcl, Lisinopril, Milk-related compounds, Oxycodone Hcl, Spironolactone, Tape, Valsartan           Current Medications (01/12/2018):  This is the current hospital active medication list Current Facility-Administered Medications  Medication Dose Route Frequency Provider Last Rate Last Dose  . acetaminophen  (TYLENOL) tablet 650 mg  650 mg Oral Q6H Love, Ivan Anchors, PA-C   650 mg at 01/12/18 1200  . alum & mag hydroxide-simeth (MAALOX/MYLANTA) 200-200-20 MG/5ML suspension 30 mL  30 mL Oral Q4H PRN Bary Leriche, PA-C   30 mL at 12/28/17 1249  . bisacodyl (DULCOLAX) suppository 10 mg  10 mg Rectal Daily PRN Bary Leriche, PA-C   10 mg at 12/24/17 2202  . calcium carbonate (OS-CAL - dosed in mg of elemental calcium) tablet 500 mg of elemental calcium  1 tablet Oral Q breakfast Love, Pamela S, PA-C   500 mg of elemental calcium at 01/12/18 0850  . cholecalciferol (VITAMIN D3) tablet 1,000 Units  1,000 Units Oral Daily Bary Leriche, PA-C   1,000 Units at 01/12/18 0851  . cloNIDine (CATAPRES) tablet 0.1 mg  0.1 mg Oral Q6H PRN Bary Leriche, PA-C   0.1 mg at 01/03/18 0610  . docusate sodium (COLACE) capsule 100 mg  100 mg Oral BID Bary Leriche, PA-C   100 mg at 01/12/18 0932  . feeding supplement (BOOST / RESOURCE BREEZE) liquid 1 Container  1 Container Oral TID WC Jamse Arn, MD   1 Container at 01/12/18 1201  . feeding supplement (PRO-STAT SUGAR FREE 64) liquid 30 mL  30 mL Oral BID Jamse Arn, MD   30 mL at 01/12/18 0852  . guaiFENesin-dextromethorphan (ROBITUSSIN DM) 100-10 MG/5ML syrup 5-10 mL  5-10 mL Oral Q6H PRN Love, Pamela S, PA-C      . hydrALAZINE (APRESOLINE) tablet 10 mg  10 mg Oral Q8H Patel, Domenick Bookbinder, MD   10 mg at 01/12/18 1415  . irbesartan (AVAPRO) tablet 300 mg  300 mg Oral Daily Jamse Arn, MD   300 mg at 01/12/18 0852  . lidocaine (XYLOCAINE) 2 % jelly   Topical PRN Love, Pamela S, PA-C      . magnesium oxide (MAG-OX) tablet 200 mg  200 mg Oral BID Reesa Chew S, PA-C   200 mg at 01/12/18 6712  . MEDLINE mouth rinse  15 mL Mouth Rinse BID Charlett Blake, MD   15 mL at 01/12/18 0850  . metoprolol tartrate (LOPRESSOR) tablet 50 mg  50 mg Oral BID Aline August, MD   50 mg at 01/12/18 0850  . multivitamin with minerals tablet 1 tablet  1 tablet Oral Daily  Jamse Arn, MD   1 tablet at 01/12/18 0851  . MUSCLE RUB CREA   Topical Q6H Bary Leriche, PA-C   1 application at 45/80/99 1203  . nitroGLYCERIN (NITROSTAT) SL tablet 0.4 mg  0.4 mg Sublingual Q5 min PRN Love, Pamela S, PA-C      . ondansetron (ZOFRAN) injection 4 mg  4 mg Intravenous Q6H PRN Love, Pamela S, PA-C      . pantoprazole (PROTONIX) EC tablet 40 mg  40 mg Oral Daily Jamse Arn, MD   40 mg at 01/12/18 0851  . polyethylene glycol (MIRALAX / GLYCOLAX) packet 17 g  17 g Oral Daily PRN Bary Leriche, PA-C   17 g at 12/23/17 1837  . polyethylene glycol (MIRALAX / GLYCOLAX) packet 17 g  17 g Oral  Daily Bary Leriche, PA-C   17 g at 01/12/18 0850  . prochlorperazine (COMPAZINE) tablet 5-10 mg  5-10 mg Oral Q6H PRN Love, Pamela S, PA-C       Or  . prochlorperazine (COMPAZINE) injection 5-10 mg  5-10 mg Intramuscular Q6H PRN Love, Pamela S, PA-C       Or  . prochlorperazine (COMPAZINE) suppository 12.5 mg  12.5 mg Rectal Q6H PRN Love, Pamela S, PA-C      . ranitidine (ZANTAC) 150 MG/10ML syrup 150 mg  150 mg Oral BID Jamse Arn, MD   150 mg at 01/12/18 0850  . simethicone (MYLICON) chewable tablet 80 mg  80 mg Oral QID PRN Bary Leriche, PA-C   80 mg at 01/10/18 1506  . sodium phosphate (FLEET) 7-19 GM/118ML enema 1 enema  1 enema Rectal Once PRN Love, Pamela S, PA-C      . tamsulosin (FLOMAX) capsule 0.4 mg  0.4 mg Oral Daily Love, Pamela S, PA-C   0.4 mg at 01/12/18 5621     Discharge Medications: Please see discharge summary for a list of discharge medications.  Relevant Imaging Results:  Relevant Lab Results:   Additional Information SSN: [id number]; Pt is using a tilt-in-space wheelchair.  Dedric Ethington, Silvestre Mesi, LCSW

## 2018-01-12 NOTE — Progress Notes (Signed)
Physical Therapy Session Note  Patient Details  Name: [name] MRN: [id number] Date of Birth: 04/08/1926  Today's Date: 01/12/2018 PT Individual Time: 7680-8811 PT Individual Time Calculation (min): 41 min   Short Term Goals: Week 3:  PT Short Term Goal 1 (Week 3): SGT=LTG due to ELOS   Skilled Therapeutic Interventions/Progress Updates: Pt presented in bed eyes closed and easily aroused. Pt checked and noted urinary incontinence. Performed rolling L/R maxA with max multimodal cues for reaching for bed rail and positioning of LE. Pants donned total A for threading and maxA for rolling.  Performed supine to sit maxA with use of bed features. Pt maintained upright sitting with min/modA with multimodal cues for midline orientation and to decrease forward flexion while shirt donned which engaging in meaningful conversation to maintain alertness. SB transfer to L maxA x 2 with multimodal cues for increased forward flexion for head hips relationship. Required total A for repositioning in TIS chair. Pt engaged in combing hair and assistance in applying dentrures. Pt left in chair at end of session with call bell within reach, current needs met and listening to music from computer.      Therapy Documentation Precautions:  Precautions Precautions: Fall Precaution Comments: pushing left, decreased attention, left hemiparesis Restrictions Weight Bearing Restrictions: No General:   Vital Signs: Therapy Vitals Pulse Rate: (!) 50 Resp: 17 BP: (!) 103/51 Patient Position (if appropriate): Lying Oxygen Therapy SpO2: 99 % O2 Device: Room Air Pain: Pain Assessment Pain Score: 0-No pain    Therapy/Group: Individual Therapy  Hennessy Bartel  Jacody Beneke, PTA  01/12/2018, 3:20 PM

## 2018-01-12 NOTE — Progress Notes (Signed)
[place] PHYSICAL MEDICINE & REHABILITATION PROGRESS NOTE  Subjective/Complaints: Patient seen laying in bed this morning.  She was interacting with therapies, but closes her eyes upon arrival.  She does state that she feels weak.  ROS: Limited due to cognition  Objective: Vital Signs: Blood pressure (!) 144/78, pulse (!) 57, temperature (!) 97.4 F (36.3 C), resp. rate 18, height 5\' 5"  (1.651 m), weight 71.4 kg, SpO2 99 %. Ct Head Wo Contrast  Result Date: 01/11/2018 CLINICAL DATA:  The patient suffered an intracranial hemorrhage on 12/18/2017. No known injury. Subsequent encounter. EXAM: CT HEAD WITHOUT CONTRAST TECHNIQUE: Contiguous axial images were obtained from the base of the skull through the vertex without intravenous contrast. COMPARISON:  Head CT scan 12/18/2017, 12/25/2017 and 12/29/2017. FINDINGS: Brain: Hemorrhage tracking within and along the right lateral ventricle is again seen and demonstrates expected evolutionary change with decrease in attenuation seen. Right to left midline shift is slightly decreased at 0.3 cm compared to 0.4 cm on the most recent exam. No new hemorrhage is identified. Small volume of hemorrhage layering dependently in the left lateral ventricle seen on the most recent examination is difficult to visualize today. The brain is atrophic with extensive chronic microvascular ischemic change. No new hemorrhage or other new abnormality is identified. Vascular: Atherosclerosis noted. Skull: Intact. Sinuses/Orbits: Negative. Other: None. IMPRESSION: No new abnormality since the most recent MRI. Expected evolutionary change of the patient's right temporal lobe and right lateral ventricle hemorrhage is again seen. Right to left midline shift is slightly decreased since the most recent exam. Electronically Signed   By: Inge Rise M.D.   On: 01/11/2018 12:14   Recent Labs    01/11/18 0732  WBC 18.2*  HGB 13.4  HCT 40.7  PLT 202   Recent Labs     01/11/18 0732  NA 130*  K 5.3*  CL 98  CO2 25  GLUCOSE 98  BUN 36*  CREATININE 0.74  CALCIUM 8.6*    Physical Exam: BP (!) 144/78 (BP Location: Left Arm)   Pulse (!) 57   Temp (!) 97.4 F (36.3 C)   Resp 18   Ht 5\' 5"  (1.651 m)   Wt 71.4 kg   SpO2 99%   BMI 26.19 kg/m  Constitutional: No distress . Vital signs reviewed. HENT: Normocephalic.  Atraumatic. Eyes: EOMI. no discharge. Cardiovascular: Irregularly irregular.  No JVD. Respiratory: CTA bilaterally.  Normal effort. GI: BS +. Non-distended. Musc: No edema or tenderness in extremities. Musculoskeletal: Generalized edema Neurological:  Lethargic Dysarthria  Previously: Motor: RUE: 4/5 proximal to distal RLE: 3+/5 proximal to distal (?  Effort, no change) LUE: 4/5 proximal to distal  LLE: 2-/5 proximal to distal   (?  Effort, no change) Skin: Skin is warm and dry.  Scattered ecchymotic areas.   Psychiatric: Unable to assess due to mentation  Assessment/Plan: 1. Functional deficits secondary to right intraventricular hemorrhage which require 3+ hours per day of interdisciplinary therapy in a comprehensive inpatient rehab setting.  Physiatrist is providing close team supervision and 24 hour management of active medical problems listed below.  Physiatrist and rehab team continue to assess barriers to discharge/monitor patient progress toward functional and medical goals  Care Tool:  Bathing    Body parts bathed by patient: Face   Body parts bathed by helper: Front perineal area, Buttocks, Right upper leg, Left upper leg, Right lower leg, Left lower leg, Right arm, Left arm, Chest, Abdomen Body parts n/a: Right upper leg, Left upper leg,  Right lower leg, Buttocks, Front perineal area, Left lower leg, Right arm   Bathing assist Assist Level: Maximal Assistance - Patient 24 - 49%     Upper Body Dressing/Undressing Upper body dressing   What is the patient wearing?: Pull over shirt    Upper body assist  Assist Level: Total Assistance - Patient < 25%    Lower Body Dressing/Undressing Lower body dressing      What is the patient wearing?: Pants     Lower body assist Assist for lower body dressing: 2 Helpers     Toileting Toileting    Toileting assist Assist for toileting: 2 Helpers     Transfers Chair/bed transfer  Transfers assist  Chair/bed transfer activity did not occur: Safety/medical concerns  Chair/bed transfer assist level: Dependent - mechanical lift     Locomotion Ambulation   Ambulation assist   Ambulation activity did not occur: Safety/medical concerns          Walk 10 feet activity   Assist  Walk 10 feet activity did not occur: Safety/medical concerns        Walk 50 feet activity   Assist Walk 50 feet with 2 turns activity did not occur: Safety/medical concerns         Walk 150 feet activity   Assist Walk 150 feet activity did not occur: Safety/medical concerns         Walk 10 feet on uneven surface  activity   Assist Walk 10 feet on uneven surfaces activity did not occur: Safety/medical concerns         Wheelchair     Assist Will patient use wheelchair at discharge?: Yes Type of Wheelchair: Manual Wheelchair activity did not occur: Safety/medical concerns  Wheelchair assist level: Dependent - Patient 0% Max wheelchair distance: 100    Wheelchair 50 feet with 2 turns activity    Assist    Wheelchair 50 feet with 2 turns activity did not occur: Safety/medical concerns   Assist Level: Dependent - Patient 0%   Wheelchair 150 feet activity     Assist Wheelchair 150 feet activity did not occur: Safety/medical concerns          Medical Problem List and Plan: 1.  Visual field deficits with gaze preference, visual hallucinations, difficulty keeping eyes open,  pusher tendencies and left bias with standing secondary to large intraventricular hemorrhage   Cont CIR  Repeat head CT reviewed, with  increase in edema, discussed with neurosurgery previously, no changes in management. Repeat head CT reviewed, stable.  Per Neurosurg, no intervention planned unless worsening CT.  Please see PA note.   Appreciate IM recs, signed off  IV steroids started on 11/11, increased on 11/12, decreased to 4 mg every 8 hours on 11/15, decreased to 4 mg twice daily on 11/18, decreased to 2 mg on 11/21, DC'd on 11/25  Appreciate palliative care consult  Appears to have behavioral component at play with varying degrees of alertness as well as orientation and interactiveness at different times  Plan for SNF   Head CT reviewed, essentially stable, EEG with generalized slowing 2.  DVT Prophylaxis/Anticoagulation: Mechanical: Sequential compression devices, below knee Bilateral lower extremities 3. Headaches/Pain Management: Limit sedating medications. Limit hydrocodone as needed for severe HA.   Topamax DC'd 4. Mood: LCSW to follow for evaluation and support when appropriate. Resumed home ativan prn at lower dose.   Provigil started on 11/14, DC'd on 11/26 5. Neuropsych: This patient is not capable of making decisions on her  own behalf 6. Skin/Wound Care: Routine pressure relief measures 7. Fluids/Electrolytes/Nutrition: Monitor I's and O's.    D2 thins, advance diet as tolerated  Intake variable 8.  E. coli UTI: Completed 7 days of ctx/amoxicillin  Foley DC'd on 11/11 9.  HTN: Blood pressure goals less than 160/90.  Continue toprolol twice daily and increased Avapro to 300 mg on 11/13.   Metoprolol increased to 50 twice daily on 11/14  Hydralazine 10 3 times daily started on 11/17 Vitals:   01/11/18 2126 01/12/18 0526  BP: 137/65 (!) 144/78  Pulse: 69 (!) 57  Resp: 18 18  Temp:    SpO2: 100% 99%   Labile on 11/26 10.  Seizure prophylaxis: Continue Keppra twice daily, DC'd on 11/12. 11.  Urinary retention: Discontinued Ditropan.  Continue Flomax routine pressure relief measures. 12.  Leukocytosis:    WBCs 18.2 on 11/25, likely steroid-induced  Afebrile  ABX per IM DC'd on 11/11  Continue to monitor 13. Hyponatremia:   Sodium 130 and 11/25  Labs pending  IVF [place] by IM, restarted on 11/12, d/ced on 11/13  Continue to monitor 14. H/o Barrett's esophagus/GERD: On Protonix 15.  Hypokalemia  Potassium 5.3 on 11/25   Labs pending  Supplement increased on 11/1, decreased on 11/18 16.  Hypoalbuminemia  Supplement initiated on 11/7 17.  Acute blood loss anemia: Resolved 18.  Fevers and lethargy: Fevers resolved, lethargy persists -?  Behavioral, see above  Appreciate IM recs, signed off  Antibiotics DC'd 19.  Hypomagnesemia  Magnesium 1.9 on 11/20  Continue supplementation  20. Steroid induced hyperglycemia   Improving with steroid wean  LOS: 20 days A FACE TO FACE EVALUATION WAS PERFORMED  Gabbie Marzo [name] 01/12/2018, 9:08 AM

## 2018-01-12 NOTE — Progress Notes (Signed)
Nutrition Follow-up  DOCUMENTATION CODES:   Non-severe (moderate) malnutrition in context of chronic illness  INTERVENTION:   - Continue feeding assistance with all meals  - Continue Pro-stat 30 ml BID, each supplement provides 100 kcal and 15 grams of protein  - Continue soup BID with lunch and dinner meals  - Continue soy milk with breakfast meal  - Continue Boost Breeze po TID, each supplement provides 250 kcal and 9 grams of protein (pt prefers wild berry flavor)  - Continue MVI with minerals daily  NUTRITION DIAGNOSIS:   Moderate Malnutrition related to chronic illness (dementia) as evidenced by mild fat depletion, moderate fat depletion, mild muscle depletion, moderate muscle depletion.  Ongoing  GOAL:   Patient will meet greater than or equal to 90% of their needs  Progressing  MONITOR:   PO intake, Weight trends, I & O's, Labs, Skin  REASON FOR ASSESSMENT:   Consult Assessment of nutrition requirement/status  ASSESSMENT:   84 year old female with PMH significant for dementia, atrial fibrillation, pulmonary hypertension, hyperlipidemia, GERD, and Barrett's esophagus. Pt was admitted on 12/18/2017 with complaints of headache and blurry vision. CT of head showed intraventricular hemorrhage. Per Neurosurgery, no need for surgical intervention.  Noted plan for discharge to SNF.  Pt with 28 lb weight loss over the past 1 month. Unsure of accuracy of these weights as pt has only had 2 recorded weights since admission to CIR. It is possible that weight loss is related to fluid status as pt appears to be much less edentulous than on initial RD assessment.  Discussed pt with RN and NT who report pt is consuming 1 soy milk daily, 2-3 Boost Breeze daily, both Pro-stats daily, and that her PO intake varies depending on the day. Per NT, pt ate 2 bowls of oatmeal, cranberry juice, water, and 1 soy milk for breakfast this morning. NT and RN also report that pt always consumes  soups with meals.  Spoke with pt at bedside. Provided pt with wild berry Boost Breeze. Pt consumed approximately 50% during visit. NT to help pt consume the remaining 50% with lunch meal. NT has been providing feeding assistance with meals. Per NT, meats have not been coming pureed. Pt does not like meats due to difficulty chewing and exhaustion.  Pt states that she wants her daughter to come visit her and tell her that everything will be alright. RD provided comfort to pt and ensured her that we are taking care of her. Pt shares that she is very tired and just wants to rest her eyes.  Meal Completion: 0-100% x last 8 meals (extremely varied)  Medications reviewed and include: Os-cal, cholecalciferol, Colace, Boost Breeze TID, Pro-stat BID, magnesium oxide, MVI with minerals, Protonix, Miralax, Zantac  Labs reviewed: sodium 130 (L), potassium 5.3 (H), BUN 36 (H)  Diet Order:   Diet Order            DIET DYS 2 Room service appropriate? Yes; Fluid consistency: Thin  Diet effective now              EDUCATION NEEDS:   Education needs have been addressed  Skin:  Skin Assessment: Skin Integrity Issues: Other: left arm skin tear  Last BM:  11/26 (small type 6)  Height:   Ht Readings from Last 1 Encounters:  12/23/17 5\' 5"  (1.651 m)    Weight:   Wt Readings from Last 1 Encounters:  01/07/18 71.4 kg    Ideal Body Weight:  56.82 kg  BMI:  Body mass index is 26.19 kg/m.  Estimated Nutritional Needs:   Kcal:  1500-1700  Protein:  85-100 grams  Fluid:  >/= 1.5 L    Gaynell Face, MS, RD, LDN Inpatient Clinical Dietitian Pager: [phone] Weekend/After Hours: [phone]

## 2018-01-13 ENCOUNTER — Inpatient Hospital Stay (HOSPITAL_COMMUNITY): Payer: Medicare Other | Admitting: Physical Therapy

## 2018-01-13 ENCOUNTER — Inpatient Hospital Stay (HOSPITAL_COMMUNITY): Payer: Medicare Other | Admitting: Speech Pathology

## 2018-01-13 ENCOUNTER — Encounter (HOSPITAL_COMMUNITY): Payer: Medicare Other | Admitting: Psychology

## 2018-01-13 ENCOUNTER — Inpatient Hospital Stay (HOSPITAL_COMMUNITY): Payer: Medicare Other | Admitting: Occupational Therapy

## 2018-01-13 NOTE — Progress Notes (Signed)
Physical Therapy Session Note  Patient Details  Name: [name] MRN: [id number] Date of Birth: October 16, 1926  Today's Date: 01/13/2018     Short Term Goals: Week 3:  PT Short Term Goal 1 (Week 3): SGT=LTG due to ELOS   Skilled Therapeutic Interventions/Progress Updates: Pt missed 60 minutes skilled PT due to increased lethargy. Pt with vasal vagal response in earlier OT session and pt unable to stay aroused at this time.      Therapy Documentation Precautions:  Precautions Precautions: Fall Precaution Comments: pushing left, decreased attention, left hemiparesis Restrictions Weight Bearing Restrictions: No General:   Vital Signs: Therapy Vitals Temp: 98.7 F (37.1 C) Pulse Rate: 61 Resp: 16 BP: (!) 105/48 Patient Position (if appropriate): Lying Oxygen Therapy SpO2: 97 % O2 Device: Room Air    Therapy/Group: Individual Therapy  Tyana Butzer  Jacki Couse, PTA  01/13/2018, 7:55 AM

## 2018-01-13 NOTE — Progress Notes (Signed)
Speech Language Pathology Daily Session Note  Patient Details  Name: [name] MRN: [id number] Date of Birth: Jan 07, 1927  Today's Date: 01/13/2018 SLP Individual Time: 1350-1430 SLP Individual Time Calculation (min): 40 min  Short Term Goals: Week 3: SLP Short Term Goal 1 (Week 3): Pt will consume trials of dys 3 textures and thin liquids with Max cues for use of swallowing precautions and minimal overt s/s of aspiration.  SLP Short Term Goal 2 (Week 3): Pt will sustain her attention to basic, functional tasks for 5 minutes with Max A cues for redirection.   SLP Short Term Goal 3 (Week 3): Pt will complete basic, familiar tasks with Max assist for functional problem solving.   SLP Short Term Goal 4 (Week 3): Pt will locate items at midline in ~50% of opportunities during basic, familiar tasks with Max assist.    Skilled Therapeutic Interventions:  Pt was seen for skilled ST targeting cognitive goals.  Upon arrival, pt had complaints of pain near peri-area, stating that it was burning where her incontinence brief was touching her.  Brief was saturated with urine and when removed, therapist noted a red welt from where brief had been rubbing against skin too tightly.  Nursing made aware and advised use of barrier cream and leaving peri area open to air to allow healing.  Pt needed mod- max cues for problem solving and sequencing during bed mobility, especially pertaining to left and right directional cues due to visuoperceptual deficits.  Once pt had been cleaned and sat upright in bed, SLP facilitated the session with a basic card sorting task to address sustained attention and visual scanning.  Pt needed overall max assist to sort cards into two groups by color.  Pt was left in bed with bed alarm set and call bell within reach.  Continue per current plan of care.    Pain Pain Assessment Pain Scale: 0-10 Faces Pain Scale: Hurts little more Pain Type: Acute pain Pain Location: Leg Pain  Orientation: Left Pain Descriptors / Indicators: Burning Pain Intervention(s): RN made aware;Repositioned;Other (Comment)(applied barrier cream and left area open to air)  Therapy/Group: Individual Therapy  [place] Thresher, Selinda Orion 01/13/2018, 4:24 PM

## 2018-01-13 NOTE — Progress Notes (Signed)
Speech Language Pathology Weekly Progress and Session Note  Patient Details  Name: [name] MRN: [id number] Date of Birth: [birth year]  Beginning of progress report period: January 07, 2018  End of progress report period:  January 13, 2018   Today's Date: 01/13/2018 SLP Individual Time: 1350-1430 SLP Individual Time Calculation (min): 40 min  Short Term Goals: Week 3: SLP Short Term Goal 1 (Week 3): Pt will consume trials of dys 3 textures and thin liquids with Max cues for use of swallowing precautions and minimal overt s/s of aspiration.  SLP Short Term Goal 1 - Progress (Week 3): Not met SLP Short Term Goal 2 (Week 3): Pt will sustain her attention to basic, functional tasks for 5 minutes with Max A cues for redirection.   SLP Short Term Goal 2 - Progress (Week 3): Not met SLP Short Term Goal 3 (Week 3): Pt will complete basic, familiar tasks with Max assist for functional problem solving.   SLP Short Term Goal 3 - Progress (Week 3): Not met SLP Short Term Goal 4 (Week 3): Pt will locate items at midline in ~50% of opportunities during basic, familiar tasks with Max assist.   SLP Short Term Goal 4 - Progress (Week 3): Met    New Short Term Goals: Week 4: SLP Short Term Goal 1 (Week 4): STG=LTG due to remaining length of stay, pending SNF placement   Weekly Progress Updates:   Pt has made limited gains this reporting period and has met 1 out of 4 short term goals.  Pt is currently max to total assist for tasks due to significant cognitive deficits and medical complexity.  Pt remains on dys 2, thin liquids diet due to inconsistent attentiveness and alertness during meals.  PO intake is also inconsistent. No family has been present for education at this time.  Pt would continue to benefit from skilled ST while inpatient in order to maximize functional independence and reduce burden of care prior to discharge.  Anticipate that pt will need SNF placement at discharge in addition to  ST follow up at next level of care.      Intensity: Minumum of 1-2 x/day, 30 to 90 minutes Frequency: Total of 15 hours over 7 days of combined therapies Duration/Length of Stay: 21-28 days  Treatment/Interventions: Cognitive remediation/compensation;Cueing hierarchy;Environmental controls;Functional tasks;Internal/external aids;Patient/family education   Tamlyn Sides, Selinda Orion 01/13/2018, 4:36 PM

## 2018-01-13 NOTE — Progress Notes (Signed)
[place] PHYSICAL MEDICINE & REHABILITATION PROGRESS NOTE  Subjective/Complaints: Patient seen sitting up in bed this morning.  No reported issues overnight.  She is more alert this morning.  Spoke to daughter over the phone.  ROS: Limited due to cognition  Objective: Vital Signs: Blood pressure 112/69, pulse 73, temperature 98.7 F (37.1 C), resp. rate 18, height 5\' 5"  (1.651 m), weight 71.4 kg, SpO2 100 %. No results found. Recent Labs    01/11/18 0732  WBC 18.2*  HGB 13.4  HCT 40.7  PLT 202   Recent Labs    01/11/18 0732 01/12/18 1012  NA 130* 131*  K 5.3* 3.9  CL 98 97*  CO2 25 24  GLUCOSE 98 150*  BUN 36* 38*  CREATININE 0.74 0.98  CALCIUM 8.6* 9.0    Physical Exam: BP 112/69 (BP Location: Right Arm)   Pulse 73   Temp 98.7 F (37.1 C)   Resp 18   Ht 5\' 5"  (1.651 m)   Wt 71.4 kg   SpO2 100%   BMI 26.19 kg/m  Constitutional: No distress . Vital signs reviewed. HENT: Normocephalic.  Atraumatic. Eyes: EOMI.  No discharge. Cardiovascular: Irregularly irregular.  No JVD. Respiratory: CTA bilaterally.  Normal effort. GI: BS +. Non-distended. Musc: No edema or tenderness in extremities. Musculoskeletal: Generalized edema Neurological:  Lethargic Dysarthria  Previously: Motor: RUE: 4/5 proximal to distal RLE: 3+/5 proximal to distal (?  Effort, stable) LUE: 4/5 proximal to distal  LLE: 2-/5 proximal to distal   (?  Effort, stable) Skin: Skin is warm and dry.  Scattered ecchymotic areas.   Psychiatric: Unable to assess due to mentation  Assessment/Plan: 1. Functional deficits secondary to right intraventricular hemorrhage which require 3+ hours per day of interdisciplinary therapy in a comprehensive inpatient rehab setting.  Physiatrist is providing close team supervision and 24 hour management of active medical problems listed below.  Physiatrist and rehab team continue to assess barriers to discharge/monitor patient progress toward functional and  medical goals  Care Tool:  Bathing    Body parts bathed by patient: Chest, Abdomen, Left arm, Right arm, Buttocks, Front perineal area, Face   Body parts bathed by helper: Buttocks, Front perineal area Body parts n/a: Left upper leg, Right lower leg, Left lower leg, Right upper leg   Bathing assist Assist Level: Maximal Assistance - Patient 24 - 49%     Upper Body Dressing/Undressing Upper body dressing   What is the patient wearing?: Hospital gown only    Upper body assist Assist Level: Total Assistance - Patient < 25%    Lower Body Dressing/Undressing Lower body dressing      What is the patient wearing?: Pants     Lower body assist Assist for lower body dressing: 2 Helpers     Toileting Toileting    Toileting assist Assist for toileting: 2 Helpers     Transfers Chair/bed transfer  Transfers assist  Chair/bed transfer activity did not occur: Safety/medical concerns  Chair/bed transfer assist level: 2 Helpers(Stedy)     Locomotion Ambulation   Ambulation assist   Ambulation activity did not occur: Safety/medical concerns          Walk 10 feet activity   Assist  Walk 10 feet activity did not occur: Safety/medical concerns        Walk 50 feet activity   Assist Walk 50 feet with 2 turns activity did not occur: Safety/medical concerns         Walk 150 feet activity  Assist Walk 150 feet activity did not occur: Safety/medical concerns         Walk 10 feet on uneven surface  activity   Assist Walk 10 feet on uneven surfaces activity did not occur: Safety/medical concerns         Wheelchair     Assist Will patient use wheelchair at discharge?: Yes Type of Wheelchair: Manual Wheelchair activity did not occur: Safety/medical concerns  Wheelchair assist level: Dependent - Patient 0% Max wheelchair distance: 100    Wheelchair 50 feet with 2 turns activity    Assist    Wheelchair 50 feet with 2 turns activity did  not occur: Safety/medical concerns   Assist Level: Dependent - Patient 0%   Wheelchair 150 feet activity     Assist Wheelchair 150 feet activity did not occur: Safety/medical concerns          Medical Problem List and Plan: 1.  Visual field deficits with gaze preference, visual hallucinations, difficulty keeping eyes open,  pusher tendencies and left bias with standing secondary to large intraventricular hemorrhage   Cont CIR  Repeat head CT reviewed, with increase in edema, discussed with neurosurgery previously, no changes in management. Repeat head CT reviewed, stable.  Per Neurosurg, no intervention planned unless worsening CT.  Please see PA note.   Appreciate IM recs, signed off  IV steroids started on 11/11, increased on 11/12, decreased to 4 mg every 8 hours on 11/15, decreased to 4 mg twice daily on 11/18, decreased to 2 mg on 11/21, DC'd on 11/25  Appreciate palliative care consult  Appears to have behavioral component at play with varying degrees of alertness as well as orientation and interactiveness at different times  Plan for SNF   Head CT reviewed, essentially stable, EEG with generalized slowing 2.  DVT Prophylaxis/Anticoagulation: Mechanical: Sequential compression devices, below knee Bilateral lower extremities 3. Headaches/Pain Management: Limit sedating medications. Limit hydrocodone as needed for severe HA.   Topamax DC'd 4. Mood: LCSW to follow for evaluation and support when appropriate. Resumed home ativan prn at lower dose.   Provigil started on 11/14, DC'd on 11/26 5. Neuropsych: This patient is not capable of making decisions on her own behalf 6. Skin/Wound Care: Routine pressure relief measures 7. Fluids/Electrolytes/Nutrition: Monitor I's and O's.    D2 thins, advance diet as tolerated  Intake variable 8.  E. coli UTI: Completed 7 days of ctx/amoxicillin  Foley DC'd on 11/11 9.  HTN: Blood pressure goals less than 160/90.  Continue toprolol twice  daily and increased Avapro to 300 mg on 11/13.   Metoprolol increased to 50 twice daily on 11/14  Hydralazine 10 3 times daily started on 11/17 Vitals:   01/13/18 0619 01/13/18 0838  BP: (!) 105/48 112/69  Pulse: 61 73  Resp: 16 18  Temp: 98.7 F (37.1 C)   SpO2: 97% 100%   Relatively controlled on 11/27 10.  Seizure prophylaxis: Continue Keppra twice daily, DC'd on 11/12, restarted on 11/26. 11.  Urinary retention: Discontinued Ditropan.  Continue Flomax routine pressure relief measures. 12.  Leukocytosis:   WBCs 18.2 on 11/25, likely steroid-induced  Afebrile  ABX per IM DC'd on 11/11  Continue to monitor 13. Hyponatremia:   Sodium 131 on 11/26  IVF DC'd by IM, restarted on 11/12, d/ced on 11/13  Continue to monitor 14. H/o Barrett's esophagus/GERD: On Protonix 15.  Hypokalemia  Potassium 3.9 on 11/26  Supplement increased on 11/1, decreased on 11/18 16.  Hypoalbuminemia  Supplement initiated  on 11/7 17.  Acute blood loss anemia: Resolved 18.  Fevers and lethargy: Fevers resolved, lethargy persists -?  Behavioral, see above  Appreciate IM recs, signed off  Antibiotics DC'd 19.  Hypomagnesemia  Magnesium 1.9 on 11/20  Continue supplementation  20. Steroid induced hyperglycemia   Improving with steroid wean  >25 minutes spent in total, with >20 minutes in counseling regarding current medical condition, medications, prognosis, etc. with daughter  LOS: 21 days A FACE TO FACE EVALUATION WAS PERFORMED  Ankit [name] 01/13/2018, 1:08 PM

## 2018-01-13 NOTE — Progress Notes (Signed)
Occupational Therapy Session Note  Patient Details  Name: [name] MRN: [id number] Date of Birth: 03-27-1926  Today's Date: 01/13/2018 OT Individual Time: 2800-3491 OT Individual Time Calculation (min): 57 min    Short Term Goals: Week 3:  OT Short Term Goal 1 (Week 3): Continue working on [employer].   Skilled Therapeutic Interventions/Progress Updates:    Session focused on bathing, dressing, and toileting tasks.  Pt needed max assist for transition from supine to sit EOB.  She was able to maintain static sitting once positioned with overall min assist to min guard.  Mod assist needed once she began washing her UB, secondary to posterior bias.  Pt resistant to therapist's tactile input to correct balance whether its helping her to sit forward secondary to posterior lean or if she is leaning forward and needs help to come back up to midline.  Overall she demonstrates lean to the left secondary to perceptual deficits as well as flexed trunk and posterior pelvic tilt.  Max instructional cueing for initiation and carry through of UB bathing.  Therapist encouraged visual scanning to the left of midline with max demonstrational cueing to locate washcloth and wash pan,  While bathing, she voiced the need to toilet.  Provided total assist +2 (pt 20%) for transfer to the 3:1 with use of the [place].  Once on the toilet she demonstrated vagal vasal response and became unresponsive with eyes deviated to the left.  This episode lasted for greater than 2 mins with emergency button pulled in order to get pt back to bed immediately.  Once in the bed and positioned in trendelenberg she began to respond again.  BP taken in supine at 112/69.  Finished cleaning pt from bladder and bowel movement and donned new brief and gown with total assist from bed level.  Pt left in bed resting with call button and phone in reach and bed alarm in place.  Noted new skin tear on left lateral elbow from transfer to the  bed after passing out.    Therapy Documentation Precautions:  Precautions Precautions: Fall Precaution Comments: pushing left, decreased attention, left hemiparesis, vasovagal response Restrictions Weight Bearing Restrictions: No    Vital Signs: Therapy Vitals Temp: 98.7 F (37.1 C) Pulse Rate: 73 Resp: 18 BP: 112/69 Patient Position (if appropriate): Sitting Oxygen Therapy SpO2: 100 % O2 Device: Room Air Pain: Pain Assessment Pain Scale: Faces Faces Pain Scale: Hurts a little bit Pain Type: Acute pain Pain Location: Generalized Pain Orientation: Anterior Pain Descriptors / Indicators: Discomfort Pain Onset: With Activity Pain Intervention(s): Repositioned ADL: See Care Tool Section for some ADL details  Therapy/Group: Individual Therapy  Adelard Sanon OTR/L 01/13/2018, 9:04 AM

## 2018-01-14 MED ORDER — LEVETIRACETAM 100 MG/ML PO SOLN
500.0000 mg | Freq: Two times a day (BID) | ORAL | Status: DC
  Administered 2018-01-14 – 2018-01-17 (×7): 500 mg via ORAL
  Filled 2018-01-14 (×8): qty 5

## 2018-01-14 MED ORDER — DOCUSATE SODIUM 50 MG/5ML PO LIQD
100.0000 mg | Freq: Two times a day (BID) | ORAL | Status: DC
  Administered 2018-01-15 – 2018-01-17 (×7): 100 mg via ORAL
  Filled 2018-01-14 (×7): qty 10

## 2018-01-14 NOTE — Progress Notes (Signed)
[place] PHYSICAL MEDICINE & REHABILITATION PROGRESS NOTE  Subjective/Complaints: Patient seen laying in bed this morning.  No reported issues.  She provides little interaction.  ROS: Limited due to cognition  Objective: Vital Signs: Blood pressure 128/87, pulse 68, temperature (!) 97.5 F (36.4 C), resp. rate 17, height 5\' 5"  (1.651 m), weight 71.4 kg, SpO2 98 %. No results found. No results for input(s): WBC, HGB, HCT, PLT in the last 72 hours. Recent Labs    01/12/18 1012  NA 131*  K 3.9  CL 97*  CO2 24  GLUCOSE 150*  BUN 38*  CREATININE 0.98  CALCIUM 9.0    Physical Exam: BP 128/87 (BP Location: Left Arm)   Pulse 68   Temp (!) 97.5 F (36.4 C)   Resp 17   Ht 5\' 5"  (1.651 m)   Wt 71.4 kg   SpO2 98%   BMI 26.19 kg/m  Constitutional: No distress . Vital signs reviewed. HENT: Normocephalic.  Atraumatic. Eyes: Keeps eyes closed. Cardiovascular: Irregularly irregular.  No JVD. Respiratory: CTA bilaterally.  Normal effort. GI: BS +. Non-distended. Musc: No edema or tenderness in extremities. Musculoskeletal: Generalized edema Neurological:  Lethargic Dysarthria Previously: Motor: RUE: 4/5 proximal to distal RLE: 3+/5 proximal to distal (?  Effort, stable) LUE: 4/5 proximal to distal  LLE: 2-/5 proximal to distal   (?  Effort, stable) Skin: Skin is warm and dry.  Scattered ecchymotic areas.   Psychiatric: Unable to assess due to mentation  Assessment/Plan: 1. Functional deficits secondary to right intraventricular hemorrhage which require 3+ hours per day of interdisciplinary therapy in a comprehensive inpatient rehab setting.  Physiatrist is providing close team supervision and 24 hour management of active medical problems listed below.  Physiatrist and rehab team continue to assess barriers to discharge/monitor patient progress toward functional and medical goals  Care Tool:  Bathing    Body parts bathed by patient: Chest, Abdomen, Left arm, Right  arm, Buttocks, Front perineal area, Face   Body parts bathed by helper: Buttocks, Front perineal area Body parts n/a: Left upper leg, Right lower leg, Left lower leg, Right upper leg   Bathing assist Assist Level: Maximal Assistance - Patient 24 - 49%     Upper Body Dressing/Undressing Upper body dressing   What is the patient wearing?: Hospital gown only    Upper body assist Assist Level: Total Assistance - Patient < 25%    Lower Body Dressing/Undressing Lower body dressing      What is the patient wearing?: Pants     Lower body assist Assist for lower body dressing: 2 Helpers     Toileting Toileting    Toileting assist Assist for toileting: 2 Helpers     Transfers Chair/bed transfer  Transfers assist  Chair/bed transfer activity did not occur: Safety/medical concerns  Chair/bed transfer assist level: 2 Helpers(Stedy)     Locomotion Ambulation   Ambulation assist   Ambulation activity did not occur: Safety/medical concerns          Walk 10 feet activity   Assist  Walk 10 feet activity did not occur: Safety/medical concerns        Walk 50 feet activity   Assist Walk 50 feet with 2 turns activity did not occur: Safety/medical concerns         Walk 150 feet activity   Assist Walk 150 feet activity did not occur: Safety/medical concerns         Walk 10 feet on uneven surface  activity  Assist Walk 10 feet on uneven surfaces activity did not occur: Safety/medical concerns         Wheelchair     Assist Will patient use wheelchair at discharge?: Yes Type of Wheelchair: Manual Wheelchair activity did not occur: Safety/medical concerns  Wheelchair assist level: Dependent - Patient 0% Max wheelchair distance: 100    Wheelchair 50 feet with 2 turns activity    Assist    Wheelchair 50 feet with 2 turns activity did not occur: Safety/medical concerns   Assist Level: Dependent - Patient 0%   Wheelchair 150 feet  activity     Assist Wheelchair 150 feet activity did not occur: Safety/medical concerns          Medical Problem List and Plan: 1.  Visual field deficits with gaze preference, visual hallucinations, difficulty keeping eyes open,  pusher tendencies and left bias with standing secondary to large intraventricular hemorrhage   Cont CIR  Repeat head CT reviewed, with increase in edema, discussed with neurosurgery previously, no changes in management. Repeat head CT reviewed, stable.  Per Neurosurg, no intervention planned unless worsening CT.  Please see PA note.   Appreciate IM recs, signed off  IV steroids started on 11/11, increased on 11/12, decreased to 4 mg every 8 hours on 11/15, decreased to 4 mg twice daily on 11/18, decreased to 2 mg on 11/21, DC'd on 11/25  Appreciate palliative care consult  Appears to have behavioral component at play with varying degrees of alertness as well as orientation and interactiveness at different times  Plan for SNF when bed available  Head CT reviewed, essentially stable, EEG with generalized slowing 2.  DVT Prophylaxis/Anticoagulation: Mechanical: Sequential compression devices, below knee Bilateral lower extremities 3. Headaches/Pain Management: Limit sedating medications. Limit hydrocodone as needed for severe HA.   Topamax DC'd 4. Mood: LCSW to follow for evaluation and support when appropriate. Resumed home ativan prn at lower dose.   Provigil started on 11/14, DC'd on 11/26 5. Neuropsych: This patient is not capable of making decisions on her own behalf 6. Skin/Wound Care: Routine pressure relief measures 7. Fluids/Electrolytes/Nutrition: Monitor I's and O's.    D2 thins, advance diet as tolerated  Intake variable 8.  E. coli UTI: Completed 7 days of ctx/amoxicillin  Foley DC'd on 11/11 9.  HTN: Blood pressure goals less than 160/90.  Continue toprolol twice daily and increased Avapro to 300 mg on 11/13.   Metoprolol increased to 50 twice  daily on 11/14  Hydralazine 10 3 times daily started on 11/17 Vitals:   01/14/18 0525 01/14/18 0620  BP: (!) 123/104 128/87  Pulse: (!) 58 68  Resp: 17   Temp: (!) 97.5 F (36.4 C)   SpO2: 98%    Relatively controlled on 11/28 10.  Seizure prophylaxis: Continue Keppra twice daily, DC'd on 11/12, restarted on 11/26. 11.  Urinary retention: Discontinued Ditropan.  Continue Flomax routine pressure relief measures. 12.  Leukocytosis:   WBCs 18.2 on 11/25, likely steroid-induced  Afebrile  ABX per IM DC'd on 11/11  Continue to monitor 13. Hyponatremia:   Sodium 131 on 11/26  IVF DC'd by IM, restarted on 11/12, d/ced on 11/13  Continue to monitor 14. H/o Barrett's esophagus/GERD: On Protonix 15.  Hypokalemia  Potassium 3.9 on 11/26  Supplement increased on 11/1, decreased on 11/18 16.  Hypoalbuminemia  Supplement initiated on 11/7 17.  Acute blood loss anemia: Resolved 18.  Fevers and lethargy: Fevers resolved, lethargy persists -?  Some behavioral, see above  Appreciate IM recs, signed off  Antibiotics DC'd 19.  Hypomagnesemia  Magnesium 1.9 on 11/20  Continue supplementation  20. Steroid induced hyperglycemia   ?Improved with steroid wean   LOS: 22 days A FACE TO FACE EVALUATION WAS PERFORMED  Volanda Mangine [name] 01/14/2018, 8:31 AM

## 2018-01-15 ENCOUNTER — Inpatient Hospital Stay (HOSPITAL_COMMUNITY): Payer: Medicare Other | Admitting: Speech Pathology

## 2018-01-15 ENCOUNTER — Inpatient Hospital Stay (HOSPITAL_COMMUNITY): Payer: Medicare Other | Admitting: Physical Therapy

## 2018-01-15 ENCOUNTER — Inpatient Hospital Stay (HOSPITAL_COMMUNITY): Payer: Medicare Other | Admitting: Occupational Therapy

## 2018-01-15 LAB — URINALYSIS, ROUTINE W REFLEX MICROSCOPIC
Bilirubin Urine: NEGATIVE
Glucose, UA: NEGATIVE mg/dL
KETONES UR: NEGATIVE mg/dL
Nitrite: POSITIVE — AB
PROTEIN: 100 mg/dL — AB
Specific Gravity, Urine: 1.017 (ref 1.005–1.030)
pH: 6 (ref 5.0–8.0)

## 2018-01-15 MED ORDER — CEPHALEXIN 250 MG PO CAPS
250.0000 mg | ORAL_CAPSULE | Freq: Three times a day (TID) | ORAL | Status: DC
  Administered 2018-01-15 – 2018-01-18 (×8): 250 mg via ORAL
  Filled 2018-01-15 (×9): qty 1

## 2018-01-15 NOTE — Progress Notes (Signed)
[place] PHYSICAL MEDICINE & REHABILITATION PROGRESS NOTE  Subjective/Complaints: Patient seen laying in bed this morning.  She is slowed, but cooperative.  ROS: Limited due to cognition  Objective: Vital Signs: Blood pressure (!) 151/62, pulse (!) 55, temperature 98.2 F (36.8 C), resp. rate 14, height 5\' 5"  (1.651 m), weight 71.4 kg, SpO2 99 %. No results found. No results for input(s): WBC, HGB, HCT, PLT in the last 72 hours. Recent Labs    01/12/18 1012  NA 131*  K 3.9  CL 97*  CO2 24  GLUCOSE 150*  BUN 38*  CREATININE 0.98  CALCIUM 9.0    Physical Exam: BP (!) 151/62 (BP Location: Right Arm)   Pulse (!) 55   Temp 98.2 F (36.8 C)   Resp 14   Ht 5\' 5"  (1.651 m)   Wt 71.4 kg   SpO2 99%   BMI 26.19 kg/m  Constitutional: No distress . Vital signs reviewed. HENT: Normocephalic.  Atraumatic. Eyes: EOMI.  No discharge.. Cardiovascular: Irregularly irregular.  No JVD. Respiratory: CTA bilaterally.  Normal effort. GI: BS +. Non-distended. Musc: No edema or tenderness in extremities. Musculoskeletal: Generalized edema Neurological:  Lethargic Dysarthria Previously: Motor: RUE: 4/5 proximal to distal RLE: 4-/5 proximal to distal (?  Effort) LUE: 4/5 proximal to distal  LLE: 3/5 proximal to distal   (?  Effort) Skin: Skin is warm and dry.  Scattered ecchymotic areas.   Psychiatric: Unable to assess due to mentation  Assessment/Plan: 1. Functional deficits secondary to right intraventricular hemorrhage which require 3+ hours per day of interdisciplinary therapy in a comprehensive inpatient rehab setting.  Physiatrist is providing close team supervision and 24 hour management of active medical problems listed below.  Physiatrist and rehab team continue to assess barriers to discharge/monitor patient progress toward functional and medical goals  Care Tool:  Bathing    Body parts bathed by patient: Chest, Abdomen, Left arm, Right arm, Buttocks, Front  perineal area, Face   Body parts bathed by helper: Buttocks, Front perineal area Body parts n/a: Left upper leg, Right lower leg, Left lower leg, Right upper leg   Bathing assist Assist Level: Maximal Assistance - Patient 24 - 49%     Upper Body Dressing/Undressing Upper body dressing   What is the patient wearing?: Hospital gown only    Upper body assist Assist Level: Total Assistance - Patient < 25%    Lower Body Dressing/Undressing Lower body dressing      What is the patient wearing?: Pants     Lower body assist Assist for lower body dressing: 2 Helpers     Toileting Toileting    Toileting assist Assist for toileting: 2 Helpers     Transfers Chair/bed transfer  Transfers assist  Chair/bed transfer activity did not occur: Safety/medical concerns  Chair/bed transfer assist level: 2 Helpers(Stedy)     Locomotion Ambulation   Ambulation assist   Ambulation activity did not occur: Safety/medical concerns          Walk 10 feet activity   Assist  Walk 10 feet activity did not occur: Safety/medical concerns        Walk 50 feet activity   Assist Walk 50 feet with 2 turns activity did not occur: Safety/medical concerns         Walk 150 feet activity   Assist Walk 150 feet activity did not occur: Safety/medical concerns         Walk 10 feet on uneven surface  activity   Assist  Walk 10 feet on uneven surfaces activity did not occur: Safety/medical concerns         Wheelchair     Assist Will patient use wheelchair at discharge?: Yes Type of Wheelchair: Manual Wheelchair activity did not occur: Safety/medical concerns  Wheelchair assist level: Dependent - Patient 0% Max wheelchair distance: 100    Wheelchair 50 feet with 2 turns activity    Assist    Wheelchair 50 feet with 2 turns activity did not occur: Safety/medical concerns   Assist Level: Dependent - Patient 0%   Wheelchair 150 feet activity     Assist  Wheelchair 150 feet activity did not occur: Safety/medical concerns          Medical Problem List and Plan: 1.  Visual field deficits with gaze preference, visual hallucinations, difficulty keeping eyes open,  pusher tendencies and left bias with standing secondary to large intraventricular hemorrhage   Cont CIR  Repeat head CT reviewed, with increase in edema, discussed with neurosurgery previously, no changes in management. Repeat head CT reviewed, stable.  Per Neurosurg, no intervention planned unless worsening CT.  Please see PA note.   Appreciate IM recs, signed off  IV steroids started on 11/11, increased on 11/12, decreased to 4 mg every 8 hours on 11/15, decreased to 4 mg twice daily on 11/18, decreased to 2 mg on 11/21, DC'd on 11/25  Appreciate palliative care consult  Appears to have behavioral component at play with varying degrees of alertness as well as orientation and interactiveness at different times  Plan for SNF when bed available  Patient will continue to have some waxing and waning in cognition  Head CT reviewed, essentially stable, EEG with generalized slowing 2.  DVT Prophylaxis/Anticoagulation: Mechanical: Sequential compression devices, below knee Bilateral lower extremities 3. Headaches/Pain Management: Limit sedating medications. Limit hydrocodone as needed for severe HA.   Topamax DC'd 4. Mood: LCSW to follow for evaluation and support when appropriate. Resumed home ativan prn at lower dose.   Provigil started on 11/14, DC'd on 11/26 5. Neuropsych: This patient is not capable of making decisions on her own behalf 6. Skin/Wound Care: Routine pressure relief measures 7. Fluids/Electrolytes/Nutrition: Monitor I's and O's.    D2 thins, advance diet as tolerated  Intake variable 8.  E. coli UTI: Completed 7 days of ctx/amoxicillin  Foley DC'd on 11/11 9.  HTN: Blood pressure goals less than 160/90.  Continue toprolol twice daily and increased Avapro to 300 mg on  11/13.   Metoprolol increased to 50 twice daily on 11/14  Hydralazine 10 3 times daily started on 11/17 Vitals:   01/14/18 1945 01/15/18 0549  BP: (!) 149/70 (!) 151/62  Pulse: 74 (!) 55  Resp: 14 14  Temp: 98.2 F (36.8 C) 98.2 F (36.8 C)  SpO2: 97% 99%   Labile on 11/29 10.  Seizure prophylaxis: Continue Keppra twice daily, DC'd on 11/12, restarted on 11/26. 11.  Urinary retention: Discontinued Ditropan.  Continue Flomax routine pressure relief measures. 12.  Leukocytosis:   WBCs 18.2 on 11/25, likely steroid-induced  Afebrile  ABX per IM DC'd on 11/11  Continue to monitor 13. Hyponatremia:   Sodium 131 on 11/26  IVF DC'd by IM, restarted on 11/12, d/ced on 11/13  Continue to monitor 14. H/o Barrett's esophagus/GERD: On Protonix 15.  Hypokalemia  Potassium 3.9 on 11/26  Supplement increased on 11/1, decreased on 11/18 16.  Hypoalbuminemia  Supplement initiated on 11/7 17.  Acute blood loss anemia: Resolved 18.  Fevers and lethargy: Fevers resolved, lethargy persists -?  Some behavioral, see above  Appreciate IM recs, signed off  Antibiotics DC'd 19.  Hypomagnesemia  Magnesium 1.9 on 11/20  Continue supplementation  20. Steroid induced hyperglycemia   ?Improved with steroid wean   LOS: 23 days A FACE TO FACE EVALUATION WAS PERFORMED  Ankit [name] 01/15/2018, 8:56 AM

## 2018-01-15 NOTE — Plan of Care (Signed)
  Problem: RH BOWEL ELIMINATION Goal: RH STG MANAGE BOWEL WITH ASSISTANCE Description STG Manage Bowel with Assistance. Max   Outcome: Progressing Goal: RH STG MANAGE BOWEL W/MEDICATION W/ASSISTANCE Description STG Manage Bowel with Medication with Assistance. max  Outcome: Progressing   Problem: RH BLADDER ELIMINATION Goal: RH STG MANAGE BLADDER WITH ASSISTANCE Description STG Manage Bladder With max Assistance    Outcome: Progressing   Problem: RH SKIN INTEGRITY Goal: RH STG MAINTAIN SKIN INTEGRITY WITH ASSISTANCE Description STG Maintain Skin Integrity With max Assistance.   Outcome: Progressing   Problem: RH SAFETY Goal: RH STG ADHERE TO SAFETY PRECAUTIONS W/ASSISTANCE/DEVICE Description STG Adhere to Safety Precautions With Assistance/Device. Max   Outcome: Progressing   Problem: RH COGNITION-NURSING Goal: RH STG USES MEMORY AIDS/STRATEGIES W/ASSIST TO PROBLEM SOLVE Description STG Uses Memory Aids/Strategies With Assistance to Problem Solve. Max   Outcome: Progressing   Problem: RH PAIN MANAGEMENT Goal: RH STG PAIN MANAGED AT OR BELOW PT'S PAIN GOAL Description Less than 5  Outcome: Progressing

## 2018-01-15 NOTE — Progress Notes (Signed)
Speech Language Pathology Daily Session Note  Patient Details  Name: BASILIA STUCKERT MRN: [id number] Date of Birth: 03/25/1926  Today's Date: 01/15/2018 SLP Individual Time: 0940-1003 SLP Individual Time Calculation (min): 23 min  Short Term Goals: Week 4: SLP Short Term Goal 1 (Week 4): STG=LTG due to remaining length of stay, pending SNF placement    Skilled Therapeutic Interventions:  Pt continues to struggle with lethargy and as such session was spent providing Max A multimodal cues to maintain alertness for brief periods of time (< 1 minute). Pt unable to participate in skilled ST tasks. Pt with brief attempt to wipe her face but unable to perform functionally to completion.  Education provided to nursing on decreased arousal. Pt left in bed with bed alarm on and all needs within reach.      Pain Pain Assessment Pain Scale: (none expressed) Faces Pain Scale: Hurts a little bit Pain Type: Other (Comment)(generalized) Pain Location: Generalized Pain Descriptors / Indicators: Discomfort Pain Onset: Gradual Patients Stated Pain Goal: 0 Pain Intervention(s): Repositioned  Therapy/Group: Individual Therapy  Samar Venneman 01/15/2018, 10:47 AM

## 2018-01-15 NOTE — Progress Notes (Addendum)
Occupational Therapy Session Note  Patient Details  Name: [name] MRN: [id number] Date of Birth: June 15, 1926  Today's Date: 01/15/2018 OT Individual Time: 2426-8341 OT Individual Time Calculation (min): 61 min   Skilled Therapeutic Interventions/Progress Updates:    Pt greeted in bed with RN present. Started session with LB bathing bedlevel. Pt able to elevate each LE against gravity to assist OT with washing. Unable to discriminate Lt/Rt, but able to move Lt when asking her to move "other leg." Able to maintain peaked position bilaterally for washing under thighs. 2 helpers for perihygiene when rolling Rt>Lt post incontinent BM and also for supine<sit after Teds were donned. She required Mod-Max A for sitting balance EOB with tactile cues for Rt-sided and anterior weight shifting. Worked on Lt attention ad Lt visual scanning when washing Lt side and incorporating L UE into bathing tasks. Pt required cues for termination due to perseverative tendencies and persistent grip on objects when asked to release. Sit<supine completed with 2 assist, and then she was boosted up in bed and repositioned for comfort. Pt left with call bell on chest, 4 bed rails up, and bed alarm set.   Notified RN regarding pt c/o burning sensation while urinating and foul urine smell in brief.  Also provided pt with elbow protector for Lt arm.   Therapy Documentation Precautions:  Precautions Precautions: Fall Precaution Comments: pushing left, decreased attention, left hemiparesis, vasovagal response Restrictions Weight Bearing Restrictions: No Pain: No c/o pain, but "soreness" all over. RN made aware.   Pain Assessment Pain Scale: (none expressed) ADL: ADL Eating: Maximal assistance Where Assessed-Eating: Bed level Grooming: Maximal assistance Where Assessed-Grooming: Bed level Upper Body Bathing: Maximal assistance Where Assessed-Upper Body Bathing: Bed level Lower Body Bathing: Dependent Where  Assessed-Lower Body Bathing: Bed level Upper Body Dressing: Dependent Where Assessed-Upper Body Dressing: Bed level Lower Body Dressing: Unable to assess Toileting: Dependent Where Assessed-Toileting: Bed level Toilet Transfer: Unable to assess Tub/Shower Transfer: Unable to assess Gaffer Transfer: Unable to assess      Therapy/Group: Individual Therapy  Chaniqua Brisby A Burleigh Brockmann 01/15/2018, 12:49 PM

## 2018-01-15 NOTE — Patient Care Conference (Signed)
Inpatient RehabilitationTeam Conference and Plan of Care Update Date: 01/13/2018   Time: 2:40 PM    Patient Name: [name]      Medical Record Number: [id number]  Date of Birth: [birth year] Sex: Female         Room/Bed: 4M12C/4M12C-01 Payor Info: Payor: MEDICARE / Plan: MEDICARE PART A AND B / Product Type: *No Product type* /    Admitting Diagnosis: R IVH  Admit Date/Time:  12/23/2017  4:20 PM Admission Comments: No comment available   Primary Diagnosis:  <principal problem not specified> Principal Problem: <principal problem not specified>  Patient Active Problem List   Diagnosis Date Noted  . Steroid-induced hyperglycemia   . Benign essential HTN   . Labile blood pressure   . Hypertensive crisis   . Cerebral edema (HCC)   . Hypomagnesemia   . Dysphagia, post-stroke   . Lethargy   . Acute blood loss anemia   . Hypoalbuminemia due to protein-calorie malnutrition ([place])   . ICH (intracerebral hemorrhage) ([place]) 12/23/2017  . History of Barrett's esophagus   . Urinary retention   . Seizure prophylaxis   . E. coli UTI   . Intraventricular hemorrhage ([place])   . Urinary tract infection without hematuria   . Coronary artery disease involving native coronary artery of native heart without angina pectoris   . Pulmonary HTN ([place])   . Leukocytosis   . Intracranial hemorrhage ([place]) 12/18/2017  . Syncope 04/10/2015  . Sepsis secondary to UTI ([place]) 04/10/2015  . URI (upper respiratory infection) 04/10/2015  . UTI (lower urinary tract infection) 04/10/2015  . QT prolongation 10/12/2014  . Headache 09/02/2012  . Tricuspid valve regurgitation, moderate 08/18/2012  . Hypokalemia 08/16/2012  . Hyponatremia 08/16/2012  . Atrial fibrillation ([place])   . Hypertension   . Anxiety   . Mixed hyperlipidemia 09/11/2009  . GERD 08/30/2008  . CHEST PAIN 08/30/2008  . Coronary atherosclerosis 04/04/2008  . Syncope and collapse 04/04/2008    Expected Discharge Date: Expected Discharge  Date: (SNF)  Team Members Present: Physician leading conference: Dr. Delice Lesch Social Worker Present: Alfonse Alpers, LCSW Nurse Present: Frances Maywood, RN PT Present: Lavone Nian, PT;Rosita Dechalus, PTA OT Present: Clyda Greener, OT SLP Present: Windell Moulding, SLP PPS Coordinator present : Daiva Nakayama, RN, CRRN     Current Status/Progress Goal Weekly Team Focus  Medical   Visual field deficits with gaze preference, visual hallucinations, difficulty keeping eyes open,  pusher tendencies and left bias with standing secondary to large intraventricular hemorrhage   Improve mobility, BP, cognition, ?seizures  See above   Bowel/Bladder   Incontinent of bowel and bladder;  LBM 11/29; checking PVRs.  Possible UTI- sent culture  Max assist  Assess and treat for constipation or urinary retention   Swallow/Nutrition/ Hydration   remains on dys 2, thin liquids; full supervision   supervision   toleration of dys 2 textures, improving PO intake   ADL's   max -total A of 2 overall  LTGs downgraded on 01/05/18 to mod A with grooming and UB bathing and sit balance, max A dressing; max sit to stand; stand balance, toileting, toilet transfer and shower transfer goals discontinued  sit balance, postural control, L side awareness, use of LUE to decrease burden of care with mobility and self care   Mobility    Max A bed mobility, max A x2 to dependent txs, all sessions limited by alertness or lethargy  Mod A overall Sitting balance, sitting tolerance, global conditioning, d/c planning  Communication             Safety/Cognition/ Behavioral Observations  inconsistent, max to total assist   max assist, downgraded   continue to address basic cognition    Pain   Complaints of generalized pain; on scheduled tylenol  < 4  Assess and treat for pain q shift and prn   Skin   Multiple skin tears to left arm covered with foam dressings; scattered bruising  Mod assist  Assess skin q shift and prn    Rehab  Goals Patient on target to meet rehab goals: No Rehab Goals Revised: Pt will not meet downgraded goals of mod/max A from last week. *See Care Plan and progress notes for long and short-term goals.     Barriers to Discharge  Current Status/Progress Possible Resolutions Date Resolved   Physician    Medical stability;Behavior;Inaccessible home environment;Home environment access/layout;Lack of/limited family support     See above  Therapies, follow labs, weaning, antiepilectics restarted      Nursing                  PT                    OT                  SLP                SW                Discharge Planning/Teaching Needs:  Pt will need SNF transfer and dtr is in agreement.  defer to next venue   Team Discussion:  MD has started pt back on anti-epileptic medications and pt is done with steroids.  Dr. Posey Pronto recommended pt/team try TED hose to help with pt's syncopal episodes.  Pt is max assist with dynamic sitting and is +2 with txs via stedy or squat-pivot txs.  Pt was lethargic with PT on Monday, but better Tuesday and tolerated 45 minutes.  Pt has no changes or progress with speech and goals are downgraded to max A to total.  Pt can be mod A, but is inconsistent.  Pt is limited by bowels, fatigue, and confusion.  Revisions to Treatment Plan:  none    Continued Need for Acute Rehabilitation Level of Care: The patient requires daily medical management by a physician with specialized training in physical medicine and rehabilitation for the following conditions: Daily direction of a multidisciplinary physical rehabilitation program to ensure safe treatment while eliciting the highest outcome that is of practical value to the patient.: Yes Daily medical management of patient stability for increased activity during participation in an intensive rehabilitation regime.: Yes Daily analysis of laboratory values and/or radiology reports with any subsequent need for medication adjustment  of medical intervention for : Neurological problems;Other   I attest that I was present, lead the team conference, and concur with the assessment and plan of the team.   Lyann Hagstrom, Silvestre Mesi 01/15/2018, 4:28 PM

## 2018-01-15 NOTE — Progress Notes (Signed)
Physical Therapy Session Note  Patient Details  Name: [name] MRN: [id number] Date of Birth: September 30, 1926  Today's Date: 01/15/2018 PT Individual Time: 1655-3748 PT Individual Time Calculation (min): 48 min   Short Term Goals: Week 3:  PT Short Term Goal 1 (Week 3): SGT=LTG due to ELOS   Skilled Therapeutic Interventions/Progress Updates:    Pt received supine in bed and agreeable to PT after effort from PT to arouse pt. Maxi move transfer to TIS WC. Pt transported to day room in [facility]. BP assess in WC 127/68. Maxi move transfer tilt table. PT assessed BP on tilt table to measure response to increase upright position and force WB through BLE.  Supine on table: 117/62 30 deg:  109/71 - asymptomatic.  30 deg  2 min: 1 106/92 - asymptomatic.  40 deg:  96/65 - asymptomatic.  40 deg 1 min  86/40- mildly symptomatic  40deg 2 min  77/44 - severely symptomatic with increased palor and decreased responsiveness.  Returned to supine: 102/66 mildly symptomatic Supine 1 min  115/66   Asymptomatic.    Pt returned to sitting in [place] with maximove. BP assessed in sitting 127/66. Pt returned to room in [facility] and left sitting up with call bell in reach and all needs met.      Therapy Documentation Precautions:  Precautions Precautions: Fall Precaution Comments: pushing left, decreased attention, left hemiparesis, vasovagal response Restrictions Weight Bearing Restrictions: No    Vital Signs: Therapy Vitals Temp: 97.6 F (36.4 C) Pulse Rate: 64 BP: (!) 142/76 Patient Position (if appropriate): Sitting Oxygen Therapy SpO2: 100 % O2 Device: Room Air    Therapy/Group: Individual Therapy  [name] 01/15/2018, 5:40 PM

## 2018-01-15 NOTE — Progress Notes (Signed)
Social Work Patient ID: [name], female   DOB: [birth year], 84 y.o.   MRN: [id number]   Victoria spoke with pt's dtr to update her on team conference discussion from 01-13-18.  Team feels pt is stable for transfer to SNF and that SNF level of care is more appropriate for pt than CIR at this time.  Dtr expressed understanding and agrees.  She wanted CSW to pursue [facility], as her husband is already there.  CSW called Olivia Mackie in Admissions at [facility] and they have accepted pt.  Dtr was pleased and accepted bed offer.  Dr. Posey Pronto and Reesa Chew, PA feel pt is ready for tx and bed is available on Monday.  Pt's dtr to discuss this with pt this weekend.  Pt will be transported by non-emergent ambulance.  CSW will continue to follow and CSWs, Ovidio Kin or Lennart Pall, to facilitate tx on Monday.

## 2018-01-16 ENCOUNTER — Inpatient Hospital Stay (HOSPITAL_COMMUNITY): Payer: Medicare Other | Admitting: Physical Therapy

## 2018-01-16 NOTE — Progress Notes (Signed)
Physical Therapy Discharge Summary  Patient Details  Name: [name] MRN: [id number] Date of Birth: December 28, 1926  Today's Date: 01/17/2018 PT Individual Time: 1400-1440 PT Individual Time Calculation (min): 40 min   Patient has met 4 of 4 long term goals due to improved activity tolerance, improved balance, improved postural control, increased strength, increased range of motion, decreased pain, functional use of  left upper extremity and left lower extremity and improved coordination.  Patient to discharge at a wheelchair level Max Assist.   Patient's care partner requires assistance to provide the necessary physical and cognitive assistance at discharge.  Reasons goals not met: Continued poor postural control, orthostatic hypotension, and apraxia limits safety of WC mobility at this time   Recommendation:  Patient will benefit from ongoing skilled PT services in skilled nursing facility setting to continue to advance safe functional mobility, address ongoing impairments in arousal, attention, BP control, balance, strength, endurance, transfers, bed mobility, and minimize fall risk.  Equipment: No equipment provided  Reasons for discharge: discharge from hospital  Patient/family agrees with progress made and goals achieved: Yes   PT Treatment Interventions: Pt supine in bed upon PT arrival, agreeable to therapy tx and denies pain. Pt incontinent of bowel this session. Pt performed rolling in each direction with mod-max assist, doffed/donned clean brief and performed peri care total assist. Pt performed supine>sitting EOB with max assist, verbal cues for techniques. Pt able to maintain sitting balance with min-mod assist from therapist while RN gave medicine. Pt with left lateral lean, tactile/verbal cues to correct. BP in sitting this session 101/59 and pt reports feeling weak. Pt requesting to lay back down. Max assist for sit>supine. Pt boosted up in bed total assist+2. Pt's BP in  supine. Left supine with needs in reach and bed alarm set.  Pt missed 20 minutes of skilled therapy this session secondary to fatigue and weakness.   PT Discharge Precautions/Restrictions Precautions Precautions: Fall Precaution Comments: pushing left, decreased attention, left hemiparesis, vasovagal response Restrictions Weight Bearing Restrictions: No Vital Signs Therapy Vitals Temp: 97.7 F (36.5 C) Pulse Rate: 69 Resp: 17 BP: 134/79 Patient Position (if appropriate): Sitting Oxygen Therapy SpO2: 99 % O2 Device: Room Air Pain   denies Vision/Perception  Vision - Assessment Alignment/Gaze Preference: Gaze right Tracking/Visual Pursuits: Requires cues, head turns, or add eye shifts to track Saccades: Impaired - to be further tested in functional context Perception Inattention/Neglect: Does not attend to left visual field Praxis Praxis: Impaired Praxis Impairment Details: Initiation;Ideomotor;Perseveration  Cognition Overall Cognitive Status: Impaired/Different from baseline Arousal/Alertness: Lethargic Orientation Level: Oriented to person;Oriented to situation;Oriented to time Focused Attention: Impaired Awareness: Impaired Sensation Sensation Light Touch: Impaired Detail Light Touch Impaired Details: Impaired LUE;Impaired LLE Proprioception: Impaired Detail Proprioception Impaired Details: Impaired LUE;Impaired LLE Coordination Gross Motor Movements are Fluid and Coordinated: No Fine Motor Movements are Fluid and Coordinated: No Coordination and Movement Description: very apraxic in the LUE.  Motor  Motor Motor: Hemiplegia;Motor apraxia Motor - Discharge Observations: L hemi. apraxia in the LUE>LLE  Mobility Bed Mobility Bed Mobility: Rolling Right;Rolling Left;Supine to Sit;Sit to Supine Rolling Right: Maximal Assistance - Patient 25-49% Rolling Left: Moderate Assistance - Patient 50-74% Supine to Sit: Maximal Assistance - Patient - Patient 25-49% Sit to  Supine: Maximal Assistance - Patient 25-49% Transfers Transfers: Lateral/Scoot Transfers Lateral/Scoot Transfers: Maximal Assistance - Patient 25-49% Transfer (Assistive device): Other (Comment)(SB ) Locomotion  Gait Ambulation: No Gait Gait: No Stairs / Additional Locomotion Stairs: No Wheelchair Mobility Wheelchair Mobility: Yes Wheelchair  Assistance: Dependent - Patient 0% Distance: TIS WC   Trunk/Postural Assessment  Cervical Assessment Cervical Assessment: Exceptions to WFL(cervical protraction ) Thoracic Assessment Thoracic Assessment: Exceptions to WFL(increased kyposis) Lumbar Assessment Lumbar Assessment: Exceptions to WFL(decreased lordosis ) Postural Control Postural Control: Deficits on evaluation(mild pushers syndrome and delayed righting reactions with anterior LOB )  Balance Static Sitting Balance Static Sitting - Level of Assistance: 4: Min assist Dynamic Sitting Balance Dynamic Sitting - Level of Assistance: 4: Min assist;3: Mod assist Static Standing Balance Static Standing - Level of Assistance: Not tested (comment) Static Standing - Comment/# of Minutes: unsafe to attempt; pt orthostatic Extremity Assessment  RLE Assessment RLE Assessment: Exceptions to [facility] Passive Range of Motion (PROM) Comments: pain with knee flexion past 90 degrees  General Strength Comments: grossly 4-/5 with movement in bed.  LLE Assessment LLE Assessment: Exceptions to [facility] Passive Range of Motion (PROM) Comments: [facility] General Strength Comments: grossly 4-/5 with movement in bed   Netta Corrigan, PT, DPT 01/17/18  8:02 AM   [name] 01/16/2018, 4:46 PM

## 2018-01-16 NOTE — Plan of Care (Signed)
  Problem: RH BOWEL ELIMINATION Goal: RH STG MANAGE BOWEL WITH ASSISTANCE Description STG Manage Bowel with Assistance. Max   Outcome: Progressing Goal: RH STG MANAGE BOWEL W/MEDICATION W/ASSISTANCE Description STG Manage Bowel with Medication with Assistance. max  Outcome: Progressing   Problem: RH BLADDER ELIMINATION Goal: RH STG MANAGE BLADDER WITH ASSISTANCE Description STG Manage Bladder With max Assistance    Outcome: Progressing   Problem: RH SKIN INTEGRITY Goal: RH STG MAINTAIN SKIN INTEGRITY WITH ASSISTANCE Description STG Maintain Skin Integrity With max Assistance.   Outcome: Progressing   Problem: RH SAFETY Goal: RH STG ADHERE TO SAFETY PRECAUTIONS W/ASSISTANCE/DEVICE Description STG Adhere to Safety Precautions With Assistance/Device. Max   Outcome: Progressing   Problem: RH COGNITION-NURSING Goal: RH STG USES MEMORY AIDS/STRATEGIES W/ASSIST TO PROBLEM SOLVE Description STG Uses Memory Aids/Strategies With Assistance to Problem Solve. Max   Outcome: Progressing   Problem: RH PAIN MANAGEMENT Goal: RH STG PAIN MANAGED AT OR BELOW PT'S PAIN GOAL Description Less than 5  Outcome: Progressing

## 2018-01-16 NOTE — Progress Notes (Signed)
Physical Therapy Session Note  Patient Details  Name: [name] MRN: [id number] Date of Birth: [birth year]  Today's Date: 01/16/2018 PT Individual Time: 6648-3032 PT Individual Time Calculation (min): 40 min   Short Term Goals: Week 3:  PT Short Term Goal 1 (Week 3): SGT=LTG due to ELOS   Skilled Therapeutic Interventions/Progress Updates:   Pt received supine in bed and agreeable to PT. Supine>sit transfer with max assist and max cues for sequecning, knee flexion in BLE and use of RUE on bed rails to improved  Trunk rotation. As well as use of BUE to maintain balance EOB.   Sitting balance EOB x 15 min with mod assist progressing to min-supervision assist from PT. Pt's posture improved with cues for trunk extension and midline orientation. BUE shoulder flexion x 4 each with 5 sec hold. LAQ while sitting EOB x 10 BLE. After 15 min EOB pt reports nausea and returned to supine with total assist . BP assessed in supine: 114/65.   Following prolonged rest break and improved arousal by Pt, supine>sit with max assist. BP re-assessed in sitting: 103/60, asymptomatic. SB transfer to [facility] with total assist from PT to the L and chuck pad under pt. Pt left sitting up in [facility] with call bell in reach and all needs met.       Therapy Documentation Precautions:  Precautions Precautions: Fall Precaution Comments: pushing left, decreased attention, left hemiparesis, vasovagal response Restrictions Weight Bearing Restrictions: No Vital Signs: Therapy Vitals Temp: 97.7 F (36.5 C) Pulse Rate: 69 Resp: 17 BP: 134/79 Patient Position (if appropriate): Sitting Oxygen Therapy SpO2: 99 % O2 Device: Room Air    Therapy/Group: Individual Therapy  [name] 01/16/2018, 4:25 PM

## 2018-01-16 NOTE — Progress Notes (Signed)
Patient has 2 small hardened areas that look scabbed in between her great toe & the 2nd toe of her left foot. There is no surrounding redness. Surrounding skin is blanchable. It does not seem painful, she slept while the areas were being touched & manipulated. Communication left for provider for evaluation. Will pass on to oncoming nurse.

## 2018-01-16 NOTE — Progress Notes (Signed)
[place] PHYSICAL MEDICINE & REHABILITATION PROGRESS NOTE  Subjective/Complaints: Sleeping, easily awakens Responses are limited due to cognition No pain  Objective: Vital Signs: Blood pressure 140/66, pulse (!) 58, temperature (!) 97.5 F (36.4 C), temperature source Oral, resp. rate 12, height 5\' 5"  (1.651 m), weight 71.4 kg, SpO2 100 %.  Physical Exam: BP 140/66 (BP Location: Right Arm)   Pulse (!) 58   Temp (!) 97.5 F (36.4 C) (Oral)   Resp 12   Ht 5\' 5"  (1.651 m)   Wt 71.4 kg   SpO2 100%   BMI 26.19 kg/m    No acute distress. HEENT exam atraumatic, normocephalic, extraocular muscles are intact. Neck is supple without lymphadenopathy Chest clear to auscultation Cardiac exam S1 and S2 are irregularly irregular. Abdominal exam active bowel sounds, soft, nontender Extremities without edema. Neurologic exam she is dysarthric. Constitutional: No distress . Vital si  Assessment/Plan: 1. Functional deficits secondary to right intraventricular hemorrhage    Medical Problem List and Plan: 1.  Visual field deficits with gaze preference, visual hallucinations, difficulty keeping eyes open,  pusher tendencies and left bias with standing secondary to large intraventricular hemorrhage   Cont CIR  Plan for SNF Reviewed CT 2.  DVT Prophylaxis/Anticoagulation: Mechanical: Sequential compression devices, below knee Bilateral lower extremities 3. Headaches/Pain Management: denies pain 4. Mood: LCSW to follow for evaluation and support when appropriate. Resumed home ativan prn at lower dose.   Provigil started on 11/14, DC'd on 11/26 5. Neuropsych: This patient is not capable of making decisions on her own behalf 6. Skin/Wound Care: Routine pressure relief measures 7. Fluids/Electrolytes/Nutrition: Monitor I's and O's.    Variable intake Tolerating current diet 8.  E. coli UTI: Completed 7 days of ctx/amoxicillin  Foley DC'd on 11/11 9.  HTN: Blood pressure goals less than  160/90.  continue current meds Vitals:   01/15/18 2032 01/16/18 0312  BP: (!) 153/77 140/66  Pulse: 68 (!) 58  Resp: 18 12  Temp: (!) 97.3 F (36.3 C) (!) 97.5 F (36.4 C)  SpO2: 100% 100%   Adequate control 10.  Seizure prophylaxis: Continue Keppra twice daily, DC'd on 11/12, restarted on 11/26. No sz activity noted 11.  Urinary retention: Discontinued Ditropan.  Continue Flomax routine pressure relief measures. 12.  Leukocytosis:    Lab Results  Component Value Date   WBC 18.2 (H) 01/11/2018  no s/s infection ? Steroid induced.   13. Hyponatremia:   Mild. Basic Metabolic Panel:    Component Value Date/Time   NA 131 (L) 01/12/2018 1012   NA 138 09/08/2016 1233   K 3.9 01/12/2018 1012   CL 97 (L) 01/12/2018 1012   CO2 24 01/12/2018 1012   BUN 38 (H) 01/12/2018 1012   BUN 13 09/08/2016 1233   CREATININE 0.98 01/12/2018 1012   GLUCOSE 150 (H) 01/12/2018 1012   CALCIUM 9.0 01/12/2018 1012    14. H/o Barrett's esophagus/GERD: On Protonix 15.  Hypokalemia  resolved 16.  Hypoalbuminemia  Supplement initiated on 11/7 17.  Acute blood loss anemia: Resolved 18.  Fevers and lethargy: Fevers resolved, lethargy persists - 19.  Hypomagnesemia  Magnesium 1.9 on 11/20  Continue supplementation  20. Steroid induced hyperglycemia   Improved.    LOS: 24 days A FACE TO FACE EVALUATION WAS PERFORMED  Bruce H Swords 01/16/2018, 6:45 AM

## 2018-01-17 ENCOUNTER — Inpatient Hospital Stay (HOSPITAL_COMMUNITY): Payer: Medicare Other | Admitting: Occupational Therapy

## 2018-01-17 ENCOUNTER — Inpatient Hospital Stay (HOSPITAL_COMMUNITY): Payer: Medicare Other

## 2018-01-17 NOTE — Progress Notes (Signed)
Occupational Therapy Session Note  Patient Details  Name: [name] MRN: [id number] Date of Birth: 23-Jun-1926  Today's Date: 01/17/2018 OT Individual Time: 4332-9518 OT Individual Time Calculation (min): 56 min   Short Term Goals: Week 3:  OT Short Term Goal 1 (Week 3): Continue working on [employer].   Skilled Therapeutic Interventions/Progress Updates:    Pt greeted in bed, requiring vcs to keep eyes open. Perseverating on being home and making coffee. OT oriented pt to time, place, and situation. Worked on cognition, Lt attention, and Lt NMR during bathing and dressing tasks bedlevel. Pt required max vcs and manual assist to attend to Lt side and incorporate Lt functionally. Placed her in chair position for bathing UB, with tactile and verbal cues for sustained attention to task and keeping eyes open to participate. Rolling Rt>Lt for perihygiene and brief application with Max-Total A. Pt with redness on buttocks, and therefore was positioned in sidelying using props at end of session. RN made aware. Donned clean hospital gown, Teds, and gripper socks Total A. Pt left with call bell on lap and 4 bedrails up at session exit.   Therapy Documentation Precautions:  Precautions Precautions: Fall Precaution Comments: pushing left, decreased attention, left hemiparesis, vasovagal response Restrictions Weight Bearing Restrictions: No Pain: No s/s pain during tx  Pain Assessment Pain Scale: 0-10 Pain Score: 0-No pain ADL: ADL Eating: Maximal assistance Where Assessed-Eating: Wheelchair Grooming: Maximal assistance Where Assessed-Grooming: Wheelchair Upper Body Bathing: Minimal assistance Where Assessed-Upper Body Bathing: Wheelchair Lower Body Bathing: Dependent Where Assessed-Lower Body Bathing: Bed level Upper Body Dressing: Dependent Where Assessed-Upper Body Dressing: Edge of bed Lower Body Dressing: Dependent Where Assessed-Lower Body Dressing: Bed level Toileting:  Dependent Where Assessed-Toileting: Bed level Toilet Transfer: Dependent Toilet Transfer Method: Engineer, water: Drop arm bedside commode Tub/Shower Transfer: Not assessed Social research officer, government: Not assessed  Praxis        Therapy/Group: Individual Therapy  Advika Mclelland A Yahira Timberman 01/17/2018, 12:30 PM

## 2018-01-17 NOTE — Progress Notes (Signed)
[place] PHYSICAL MEDICINE & REHABILITATION PROGRESS NOTE  Subjective/Complaints: Awake, lying in bed No complaints.   Objective: Vital Signs: Blood pressure 123/70, pulse 67, temperature (!) 97.4 F (36.3 C), temperature source Axillary, resp. rate 14, height 5\' 5"  (1.651 m), weight 71.4 kg, SpO2 98 %.  Physical Exam: BP 123/70 (BP Location: Right Arm)   Pulse 67   Temp (!) 97.4 F (36.3 C) (Axillary)   Resp 14   Ht 5\' 5"  (1.651 m)   Wt 71.4 kg   SpO2 98%   BMI 26.19 kg/m    No acute distress. HEENT exam atraumatic, normocephalic, extraocular muscles are intact. Neck is supple without lymphadenopathy Chest clear to auscultation Cardiac exam S1 and S2 are irregularly irregular. Abdominal exam active bowel sounds, soft, nontender Extremities without edema. Neurologic exam she is dysarthric. Constitutional: No distress . Vital si  Assessment/Plan: 1. Functional deficits secondary to right intraventricular hemorrhage    Medical Problem List and Plan: 1.  right IVH hemorrage. Visual field deficits with gaze preference, visual hallucinations, difficulty keeping eyes open,  pusher tendencies and left bias with standing secondary to large intraventricular hemorrhage   Cont CIR  Plan for SNF Reviewed CT 2.  DVT Prophylaxis/Anticoagulation: Mechanical: Sequential compression devices, below knee Bilateral lower extremities 3. Headaches/Pain Management: denies pain 4. Mood: LCSW to follow for evaluation and support when appropriate. Resumed home ativan prn at lower dose.   Provigil started on 11/14, DC'd on 11/26 5. Neuropsych: This patient is not capable of making decisions on her own behalf 6. Skin/Wound Care: Routine pressure relief measures 7. Fluids/Electrolytes/Nutrition: Monitor I's and O's.    Variable intake Tolerating current diet 8.  E. coli UTI: Completed 7 days of ctx/amoxicillin  Foley DC'd on 11/11 9.  HTN: Blood pressure goals less than 160/90.  -  currently at goal. continue current meds Vitals:   01/16/18 2359 01/17/18 0441  BP: (!) 141/73 123/70  Pulse: 65 67  Resp: 18 14  Temp: 97.6 F (36.4 C) (!) 97.4 F (36.3 C)  SpO2: 100% 98%    10.  Seizure prophylaxis: Continue Keppra twice daily, DC'd on 11/12, restarted on 11/26. No sz activity noted 11.  Urinary retention: Discontinued Ditropan.  Continue Flomax routine pressure relief measures. 12.  Leukocytosis:    Lab Results  Component Value Date   WBC 18.2 (H) 01/11/2018  no s/s infection ? Steroid induced.  Wbc decreasing slowly 13. Hyponatremia:   Mild. Basic Metabolic Panel:    Component Value Date/Time   NA 131 (L) 01/12/2018 1012   NA 138 09/08/2016 1233   K 3.9 01/12/2018 1012   CL 97 (L) 01/12/2018 1012   CO2 24 01/12/2018 1012   BUN 38 (H) 01/12/2018 1012   BUN 13 09/08/2016 1233   CREATININE 0.98 01/12/2018 1012   GLUCOSE 150 (H) 01/12/2018 1012   CALCIUM 9.0 01/12/2018 1012    14. H/o Barrett's esophagus/GERD: On Protonix 15.  Hypokalemia  resolved 16.  Hypoalbuminemia  Supplement initiated on 11/7 17.  Acute blood loss anemia: Resolved 18.  Fevers and lethargy: Fevers resolved, lethargy persists - 19.  Hypomagnesemia  Magnesium 1.9 on 11/20  Continue supplementation  20. Steroid induced hyperglycemia   Improved.    LOS: 25 days A FACE TO FACE EVALUATION WAS PERFORMED  Steadman Prosperi H Issabella Rix 01/17/2018, 7:23 AM

## 2018-01-17 NOTE — Progress Notes (Signed)
Occupational Therapy Discharge Summary  Patient Details  Name: [name] MRN: [id number] Date of Birth: Jan 02, 1927  Today's Date: 01/17/2018     Patient has met 4 of 7 long term goals due to improved balance, postural control, ability to compensate for deficits, functional use of  LEFT upper extremity, improved attention, improved awareness and improved coordination.  Patient to discharge at [facility] Max Assist level.  Patient's care partner unavailable to provide the necessary physical and cognitive assistance at discharge.    Reasons goals not met: Pt needs max to total assist for bathing, dressing, and transfers  Recommendation:  Patient will benefit from ongoing skilled OT services in skilled nursing facility setting to continue to advance functional skills in the area of BADL. Pt currently still needs max to total assist for selfcare tasks and functional transfers secondary to perceptual deficits, weakness, and decreased cognitive processing.  Feel she will need extensive SNF level rehab to reach min assist level for possible discharge home.   Equipment: No equipment provided  Reasons for discharge: treatment goals met and discharge from hospital  Patient/family agrees with progress made and goals achieved: Yes  OT Discharge Precautions/Restrictions  Precautions Precautions: Fall Precaution Comments: pushing left, decreased attention, left hemiparesis, vasovagal response Restrictions Weight Bearing Restrictions: No Pain Pain Assessment Pain Scale: 0-10 Pain Score: 0-No pain ADL ADL Eating: Maximal assistance Where Assessed-Eating: Wheelchair Grooming: Maximal assistance Where Assessed-Grooming: Wheelchair Upper Body Bathing: Minimal assistance Where Assessed-Upper Body Bathing: Wheelchair Lower Body Bathing: Dependent Where Assessed-Lower Body Bathing: Bed level Upper Body Dressing: Dependent Where Assessed-Upper Body Dressing: Edge of bed Lower Body Dressing:  Dependent Where Assessed-Lower Body Dressing: Bed level Toileting: Dependent Where Assessed-Toileting: Bed level Toilet Transfer: Dependent Toilet Transfer Method: Squat pivot Toilet Transfer Equipment: Drop arm bedside commode Tub/Shower Transfer: Not assessed Social research officer, government: Not assessed Vision Baseline Vision/History: Wears glasses Wears Glasses: At all times Patient Visual Report: Blurring of vision Vision Assessment?: Yes Ocular Range of Motion: Restricted on the left Alignment/Gaze Preference: Gaze right Tracking/Visual Pursuits: Requires cues, head turns, or add eye shifts to track;Other (comment)(Pt unable to track across midline to the left) Visual Fields: Left visual field deficit Perception  Perception: Impaired Inattention/Neglect: Does not attend to left side of body Praxis Praxis: Impaired Praxis Impairment Details: Ideomotor;Motor planning Cognition Overall Cognitive Status: Impaired/Different from baseline Arousal/Alertness: Lethargic Orientation Level: Oriented to person;Oriented to situation;Oriented to time Attention: Sustained;Focused Focused Attention: Impaired Focused Attention Impairment: Verbal basic;Functional basic Sustained Attention: Impaired Sustained Attention Impairment: Functional basic;Verbal basic Memory: Impaired Decreased Short Term Memory: Functional basic Awareness: Impaired Awareness Impairment: Intellectual impairment;Emergent impairment Problem Solving: Impaired Safety/Judgment: Impaired Sensation Sensation Light Touch: Impaired Detail Light Touch Impaired Details: Impaired LUE;Impaired LLE Proprioception: Impaired Detail Proprioception Impaired Details: Impaired LUE;Impaired LLE Coordination Gross Motor Movements are Fluid and Coordinated: No Fine Motor Movements are Fluid and Coordinated: No Coordination and Movement Description: Pt is able to spontaneously use the LUE for bathing tasks but needs visual input for  precise use.   Motor  Motor Motor: Hemiplegia;Motor apraxia Motor - Discharge Observations: L hemi. apraxia in the LUE>LLE Mobility  Bed Mobility Bed Mobility: Rolling Right;Rolling Left;Supine to Sit;Sit to Supine Rolling Right: Maximal Assistance - Patient 25-49% Rolling Left: Maximal Assistance - Patient 25-49% Supine to Sit: Maximal Assistance - Patient - Patient 25-49% Sit to Supine: Maximal Assistance - Patient 25-49%  Trunk/Postural Assessment  Cervical Assessment Cervical Assessment: Exceptions to WFL(cervical flexion with head rotation to the right) Thoracic Assessment Thoracic Assessment:  Exceptions to WFL(thoracic kyphosis) Lumbar Assessment Lumbar Assessment: Exceptions to WFL(lumbar flexion with posterior pelvic tilt)  Balance Balance Balance Assessed: Yes Static Sitting Balance Static Sitting - Balance Support: Feet supported Static Sitting - Level of Assistance: 3: Mod assist Dynamic Sitting Balance Dynamic Sitting - Balance Support: During functional activity Dynamic Sitting - Level of Assistance: 2: Max assist Static Standing Balance Static Standing - Balance Support: During functional activity Static Standing - Level of Assistance: 1: +2 Total assist Dynamic Standing Balance Dynamic Standing - Balance Support: During functional activity Dynamic Standing - Level of Assistance: 1: +2 Total assist Extremity/Trunk Assessment RUE Assessment RUE Assessment: Exceptions to [facility] General Strength Comments: AROM WFLS for selfcare tasks grossly, strength 3+/5 throughout LUE Assessment LUE Assessment: Exceptions to [facility] General Strength Comments: AROM grossly WFLs for all joints however pt with sensory and perceptual deficts noted.  Strength 3+/5 throughout   Green OTR/L 01/17/2018, 12:03 AM

## 2018-01-18 ENCOUNTER — Inpatient Hospital Stay (HOSPITAL_COMMUNITY): Payer: Medicare Other

## 2018-01-18 DIAGNOSIS — F0390 Unspecified dementia without behavioral disturbance: Secondary | ICD-10-CM | POA: Diagnosis present

## 2018-01-18 DIAGNOSIS — Z801 Family history of malignant neoplasm of trachea, bronchus and lung: Secondary | ICD-10-CM | POA: Diagnosis not present

## 2018-01-18 DIAGNOSIS — I482 Chronic atrial fibrillation, unspecified: Secondary | ICD-10-CM | POA: Diagnosis present

## 2018-01-18 DIAGNOSIS — I69115 Cognitive social or emotional deficit following nontraumatic intracerebral hemorrhage: Secondary | ICD-10-CM | POA: Diagnosis not present

## 2018-01-18 DIAGNOSIS — Z90710 Acquired absence of both cervix and uterus: Secondary | ICD-10-CM | POA: Diagnosis not present

## 2018-01-18 DIAGNOSIS — R531 Weakness: Secondary | ICD-10-CM | POA: Diagnosis not present

## 2018-01-18 DIAGNOSIS — I119 Hypertensive heart disease without heart failure: Secondary | ICD-10-CM | POA: Diagnosis not present

## 2018-01-18 DIAGNOSIS — N390 Urinary tract infection, site not specified: Secondary | ICD-10-CM | POA: Diagnosis present

## 2018-01-18 DIAGNOSIS — Z955 Presence of coronary angioplasty implant and graft: Secondary | ICD-10-CM | POA: Diagnosis not present

## 2018-01-18 DIAGNOSIS — I272 Pulmonary hypertension, unspecified: Secondary | ICD-10-CM | POA: Diagnosis present

## 2018-01-18 DIAGNOSIS — R55 Syncope and collapse: Secondary | ICD-10-CM | POA: Diagnosis not present

## 2018-01-18 DIAGNOSIS — I4821 Permanent atrial fibrillation: Secondary | ICD-10-CM | POA: Diagnosis not present

## 2018-01-18 DIAGNOSIS — I081 Rheumatic disorders of both mitral and tricuspid valves: Secondary | ICD-10-CM | POA: Diagnosis present

## 2018-01-18 DIAGNOSIS — I25118 Atherosclerotic heart disease of native coronary artery with other forms of angina pectoris: Secondary | ICD-10-CM | POA: Diagnosis present

## 2018-01-18 DIAGNOSIS — R2689 Other abnormalities of gait and mobility: Secondary | ICD-10-CM | POA: Diagnosis not present

## 2018-01-18 DIAGNOSIS — F419 Anxiety disorder, unspecified: Secondary | ICD-10-CM | POA: Diagnosis present

## 2018-01-18 DIAGNOSIS — I619 Nontraumatic intracerebral hemorrhage, unspecified: Secondary | ICD-10-CM | POA: Diagnosis not present

## 2018-01-18 DIAGNOSIS — Z803 Family history of malignant neoplasm of breast: Secondary | ICD-10-CM | POA: Diagnosis not present

## 2018-01-18 DIAGNOSIS — J150 Pneumonia due to Klebsiella pneumoniae: Secondary | ICD-10-CM | POA: Diagnosis present

## 2018-01-18 DIAGNOSIS — R2681 Unsteadiness on feet: Secondary | ICD-10-CM | POA: Diagnosis not present

## 2018-01-18 DIAGNOSIS — R5383 Other fatigue: Secondary | ICD-10-CM | POA: Diagnosis not present

## 2018-01-18 DIAGNOSIS — Z87440 Personal history of urinary (tract) infections: Secondary | ICD-10-CM | POA: Diagnosis not present

## 2018-01-18 DIAGNOSIS — T380X5A Adverse effect of glucocorticoids and synthetic analogues, initial encounter: Secondary | ICD-10-CM | POA: Diagnosis not present

## 2018-01-18 DIAGNOSIS — R569 Unspecified convulsions: Secondary | ICD-10-CM | POA: Diagnosis not present

## 2018-01-18 DIAGNOSIS — R0989 Other specified symptoms and signs involving the circulatory and respiratory systems: Secondary | ICD-10-CM | POA: Diagnosis not present

## 2018-01-18 DIAGNOSIS — G3184 Mild cognitive impairment, so stated: Secondary | ICD-10-CM | POA: Diagnosis not present

## 2018-01-18 DIAGNOSIS — G936 Cerebral edema: Secondary | ICD-10-CM | POA: Diagnosis not present

## 2018-01-18 DIAGNOSIS — Z8673 Personal history of transient ischemic attack (TIA), and cerebral infarction without residual deficits: Secondary | ICD-10-CM | POA: Diagnosis not present

## 2018-01-18 DIAGNOSIS — M6281 Muscle weakness (generalized): Secondary | ICD-10-CM | POA: Diagnosis not present

## 2018-01-18 DIAGNOSIS — I248 Other forms of acute ischemic heart disease: Secondary | ICD-10-CM | POA: Diagnosis present

## 2018-01-18 DIAGNOSIS — D62 Acute posthemorrhagic anemia: Secondary | ICD-10-CM | POA: Diagnosis not present

## 2018-01-18 DIAGNOSIS — R404 Transient alteration of awareness: Secondary | ICD-10-CM | POA: Diagnosis not present

## 2018-01-18 DIAGNOSIS — I639 Cerebral infarction, unspecified: Secondary | ICD-10-CM | POA: Diagnosis not present

## 2018-01-18 DIAGNOSIS — Z87891 Personal history of nicotine dependence: Secondary | ICD-10-CM | POA: Diagnosis not present

## 2018-01-18 DIAGNOSIS — R600 Localized edema: Secondary | ICD-10-CM | POA: Diagnosis not present

## 2018-01-18 DIAGNOSIS — R279 Unspecified lack of coordination: Secondary | ICD-10-CM | POA: Diagnosis not present

## 2018-01-18 DIAGNOSIS — I951 Orthostatic hypotension: Secondary | ICD-10-CM

## 2018-01-18 DIAGNOSIS — I69391 Dysphagia following cerebral infarction: Secondary | ICD-10-CM | POA: Diagnosis not present

## 2018-01-18 DIAGNOSIS — E860 Dehydration: Secondary | ICD-10-CM | POA: Diagnosis present

## 2018-01-18 DIAGNOSIS — N3000 Acute cystitis without hematuria: Secondary | ICD-10-CM | POA: Diagnosis not present

## 2018-01-18 DIAGNOSIS — Z7401 Bed confinement status: Secondary | ICD-10-CM | POA: Diagnosis not present

## 2018-01-18 DIAGNOSIS — W19XXXA Unspecified fall, initial encounter: Secondary | ICD-10-CM | POA: Diagnosis present

## 2018-01-18 DIAGNOSIS — E569 Vitamin deficiency, unspecified: Secondary | ICD-10-CM | POA: Diagnosis not present

## 2018-01-18 DIAGNOSIS — Z8249 Family history of ischemic heart disease and other diseases of the circulatory system: Secondary | ICD-10-CM | POA: Diagnosis not present

## 2018-01-18 DIAGNOSIS — R739 Hyperglycemia, unspecified: Secondary | ICD-10-CM | POA: Diagnosis not present

## 2018-01-18 DIAGNOSIS — Z23 Encounter for immunization: Secondary | ICD-10-CM | POA: Diagnosis not present

## 2018-01-18 DIAGNOSIS — K219 Gastro-esophageal reflux disease without esophagitis: Secondary | ICD-10-CM | POA: Diagnosis present

## 2018-01-18 DIAGNOSIS — M1990 Unspecified osteoarthritis, unspecified site: Secondary | ICD-10-CM | POA: Diagnosis present

## 2018-01-18 DIAGNOSIS — I10 Essential (primary) hypertension: Secondary | ICD-10-CM | POA: Diagnosis present

## 2018-01-18 DIAGNOSIS — Z8349 Family history of other endocrine, nutritional and metabolic diseases: Secondary | ICD-10-CM | POA: Diagnosis not present

## 2018-01-18 DIAGNOSIS — E46 Unspecified protein-calorie malnutrition: Secondary | ICD-10-CM | POA: Diagnosis not present

## 2018-01-18 DIAGNOSIS — M2550 Pain in unspecified joint: Secondary | ICD-10-CM | POA: Diagnosis not present

## 2018-01-18 DIAGNOSIS — R0902 Hypoxemia: Secondary | ICD-10-CM | POA: Diagnosis not present

## 2018-01-18 DIAGNOSIS — I2510 Atherosclerotic heart disease of native coronary artery without angina pectoris: Secondary | ICD-10-CM | POA: Diagnosis not present

## 2018-01-18 DIAGNOSIS — G9389 Other specified disorders of brain: Secondary | ICD-10-CM | POA: Diagnosis not present

## 2018-01-18 DIAGNOSIS — I69191 Dysphagia following nontraumatic intracerebral hemorrhage: Secondary | ICD-10-CM | POA: Diagnosis not present

## 2018-01-18 DIAGNOSIS — I959 Hypotension, unspecified: Secondary | ICD-10-CM | POA: Diagnosis not present

## 2018-01-18 DIAGNOSIS — I615 Nontraumatic intracerebral hemorrhage, intraventricular: Secondary | ICD-10-CM | POA: Diagnosis not present

## 2018-01-18 DIAGNOSIS — X58XXXA Exposure to other specified factors, initial encounter: Secondary | ICD-10-CM | POA: Diagnosis not present

## 2018-01-18 DIAGNOSIS — E785 Hyperlipidemia, unspecified: Secondary | ICD-10-CM | POA: Diagnosis present

## 2018-01-18 DIAGNOSIS — Z833 Family history of diabetes mellitus: Secondary | ICD-10-CM | POA: Diagnosis not present

## 2018-01-18 DIAGNOSIS — R1310 Dysphagia, unspecified: Secondary | ICD-10-CM | POA: Diagnosis not present

## 2018-01-18 DIAGNOSIS — D649 Anemia, unspecified: Secondary | ICD-10-CM | POA: Diagnosis not present

## 2018-01-18 LAB — BASIC METABOLIC PANEL
Anion gap: 9 (ref 5–15)
BUN: 25 mg/dL — ABNORMAL HIGH (ref 8–23)
CHLORIDE: 97 mmol/L — AB (ref 98–111)
CO2: 26 mmol/L (ref 22–32)
CREATININE: 0.77 mg/dL (ref 0.44–1.00)
Calcium: 8.4 mg/dL — ABNORMAL LOW (ref 8.9–10.3)
GFR calc Af Amer: >60 mL/min (ref >60)
GFR calc non Af Amer: >60 mL/min (ref >60)
Glucose, Bld: 106 mg/dL — ABNORMAL HIGH (ref 70–99)
Potassium: 3.4 mmol/L — ABNORMAL LOW (ref 3.5–5.1)
Sodium: 132 mmol/L — ABNORMAL LOW (ref 135–145)

## 2018-01-18 LAB — CBC
HEMATOCRIT: 36 % (ref 36.0–46.0)
Hemoglobin: 11.8 g/dL — ABNORMAL LOW (ref 12.0–15.0)
MCH: 29.5 pg (ref 26.0–34.0)
MCHC: 32.8 g/dL (ref 30.0–36.0)
MCV: 90 fL (ref 80.0–100.0)
Platelets: 169 10*3/uL (ref 150–400)
RBC: 4 MIL/uL (ref 3.87–5.11)
RDW: 14.2 % (ref 11.5–15.5)
WBC: 10.7 10*3/uL — ABNORMAL HIGH (ref 4.0–10.5)
nRBC: 0 % (ref 0.0–0.2)

## 2018-01-18 LAB — URINE CULTURE: Culture: >=100000 — AB

## 2018-01-18 MED ORDER — ORAL CARE MOUTH RINSE: 15.0000 mL | Freq: Two times a day (BID) | OROMUCOSAL | 0 refills | Status: DC

## 2018-01-18 MED ORDER — IRBESARTAN 150 MG PO TABS: 150.0000 mg | ORAL_TABLET | Freq: Every day | ORAL | Status: DC

## 2018-01-18 MED ORDER — POLYETHYLENE GLYCOL 3350 17 G PO PACK: 17.0000 g | PACK | Freq: Every day | ORAL | 0 refills | Status: AC

## 2018-01-18 MED ORDER — RANITIDINE HCL 150 MG/10ML PO SYRP: 150.0000 mg | ORAL_SOLUTION | Freq: Two times a day (BID) | ORAL | 0 refills | Status: DC

## 2018-01-18 MED ORDER — SODIUM CHLORIDE 0.9 % IV BOLUS
500.0000 mL | Freq: Once | INTRAVENOUS | Status: AC
  Administered 2018-01-18: 500 mL via INTRAVENOUS

## 2018-01-18 MED ORDER — ADULT MULTIVITAMIN W/MINERALS CH: 1.0000 | ORAL_TABLET | Freq: Every day | ORAL | Status: AC

## 2018-01-18 MED ORDER — DOCUSATE SODIUM 100 MG PO CAPS
100.0000 mg | ORAL_CAPSULE | Freq: Two times a day (BID) | ORAL | Status: DC
  Administered 2018-01-18: 100 mg via ORAL
  Filled 2018-01-18: qty 1

## 2018-01-18 MED ORDER — POTASSIUM CHLORIDE CRYS ER 20 MEQ PO TBCR
20.0000 meq | EXTENDED_RELEASE_TABLET | Freq: Every day | ORAL | Status: DC
  Administered 2018-01-18: 20 meq via ORAL
  Filled 2018-01-18: qty 1

## 2018-01-18 MED ORDER — CEPHALEXIN 250 MG PO CAPS: 250.0000 mg | ORAL_CAPSULE | Freq: Three times a day (TID) | ORAL | Status: DC

## 2018-01-18 MED ORDER — ACETAMINOPHEN 325 MG PO TABS: 650.0000 mg | ORAL_TABLET | Freq: Four times a day (QID) | ORAL | Status: DC

## 2018-01-18 MED ORDER — IRBESARTAN 300 MG PO TABS
150.0000 mg | ORAL_TABLET | Freq: Every day | ORAL | Status: DC
  Administered 2018-01-18: 150 mg via ORAL
  Filled 2018-01-18: qty 1

## 2018-01-18 MED ORDER — NITROGLYCERIN 0.4 MG SL SUBL: 0.4000 mg | SUBLINGUAL_TABLET | SUBLINGUAL | 12 refills | Status: AC | PRN

## 2018-01-18 MED ORDER — POTASSIUM CHLORIDE CRYS ER 20 MEQ PO TBCR: 20.0000 meq | EXTENDED_RELEASE_TABLET | Freq: Once | ORAL | Status: DC

## 2018-01-18 MED ORDER — MAGNESIUM OXIDE 400 (241.3 MG) MG PO TABS: 200.0000 mg | ORAL_TABLET | Freq: Two times a day (BID) | ORAL | Status: DC

## 2018-01-18 MED ORDER — TAMSULOSIN HCL 0.4 MG PO CAPS: 0.4000 mg | ORAL_CAPSULE | Freq: Every day | ORAL | Status: DC

## 2018-01-18 MED ORDER — MUSCLE RUB 10-15 % EX CREA: 1.0000 "application " | TOPICAL_CREAM | Freq: Four times a day (QID) | CUTANEOUS | 0 refills | Status: DC

## 2018-01-18 MED ORDER — METOPROLOL TARTRATE 50 MG PO TABS: 50.0000 mg | ORAL_TABLET | Freq: Two times a day (BID) | ORAL | Status: DC

## 2018-01-18 MED ORDER — SIMETHICONE 80 MG PO CHEW: 80.0000 mg | CHEWABLE_TABLET | Freq: Four times a day (QID) | ORAL | 0 refills | Status: DC | PRN

## 2018-01-18 MED ORDER — LEVETIRACETAM 500 MG PO TABS
500.0000 mg | ORAL_TABLET | Freq: Two times a day (BID) | ORAL | Status: DC
  Administered 2018-01-18: 500 mg via ORAL
  Filled 2018-01-18: qty 1

## 2018-01-18 MED ORDER — FOSFOMYCIN TROMETHAMINE 3 G PO PACK
3.0000 g | PACK | Freq: Once | ORAL | Status: AC
  Administered 2018-01-18: 3 g via ORAL
  Filled 2018-01-18: qty 3

## 2018-01-18 MED ORDER — DOCUSATE SODIUM 100 MG PO CAPS: 100.0000 mg | ORAL_CAPSULE | Freq: Two times a day (BID) | ORAL | 0 refills | Status: AC

## 2018-01-18 NOTE — Discharge Summary (Addendum)
Physician Discharge Summary  Patient ID: [name] MRN: [id number] DOB/AGE: 82-Jul-1928 82 y.o.  Admit date: 12/23/2017 Discharge date: 01/18/2018  Discharge Diagnoses:  Principal Problem:   Intraventricular hemorrhage ([place]) Active Problems:   Acute blood loss anemia   Hypoalbuminemia due to protein-calorie malnutrition (HCC)   Hypomagnesemia   Dysphagia, post-stroke   Lethargy   Cerebral edema (HCC)   Benign essential HTN   Recurrent UTI--E coli/Pseudomonas   Orthostasis   Discharged Condition:  Stable   Significant Diagnostic Studies: Ct Head Wo Contrast  Result Date: 01/11/2018 CLINICAL DATA:  The patient suffered an intracranial hemorrhage on 12/18/2017. No known injury. Subsequent encounter. EXAM: CT HEAD WITHOUT CONTRAST TECHNIQUE: Contiguous axial images were obtained from the base of the skull through the vertex without intravenous contrast. COMPARISON:  Head CT scan 12/18/2017, 12/25/2017 and 12/29/2017. FINDINGS: Brain: Hemorrhage tracking within and along the right lateral ventricle is again seen and demonstrates expected evolutionary change with decrease in attenuation seen. Right to left midline shift is slightly decreased at 0.3 cm compared to 0.4 cm on the most recent exam. No new hemorrhage is identified. Small volume of hemorrhage layering dependently in the left lateral ventricle seen on the most recent examination is difficult to visualize today. The brain is atrophic with extensive chronic microvascular ischemic change. No new hemorrhage or other new abnormality is identified. Vascular: Atherosclerosis noted. Skull: Intact. Sinuses/Orbits: Negative. Other: None. IMPRESSION: No new abnormality since the most recent MRI. Expected evolutionary change of the patient's right temporal lobe and right lateral ventricle hemorrhage is again seen. Right to left midline shift is slightly decreased since the most recent exam. Electronically Signed   By: Inge Rise M.D.    On: 01/11/2018 12:14   Ct Head Wo Contrast  Result Date: 12/29/2017 CLINICAL DATA:  Altered level of consciousness, diffuse headache, known intracranial hemorrhage EXAM: CT HEAD WITHOUT CONTRAST TECHNIQUE: Contiguous axial images were obtained from the base of the skull through the vertex without intravenous contrast. FINDINGS: Brain: Hemorrhage in the right temporoparietal region with surrounding edema is again noted. This hemorrhage does extend into the right lateral ventricle. At the same level as measured previously this blood within the right lateral ventricle measures 2.1 cm in diameter compared to the prior measurement of 2.4 cm. The midline septum is deviated slightly to the left of midline by 4 mm. A small amount of blood is again noted layering within the posterior horns of the lateral ventricles. The ventricles remain somewhat prominent as are the cortical sulci indicative of diffuse atrophy. Moderate small vessel ischemic change also is noted within the periventricular white matter. Vascular: No vascular abnormality is noted on this unenhanced study. Skull: On bone window images, no calvarial abnormality is seen other than diffuse osteopenia. Sinuses/Orbits: The paranasal sinuses appear well pneumatized. Other: None. IMPRESSION: 1. Perhaps slight decrease in size of the hemorrhage within the right temporal region with surrounding edema extending into the right lateral ventricle. The ventricular system remains dilated as are the cortical sulci consistent with atrophy with moderate small vessel ischemic change noted as well. 2. Shift of the media on septum to the left by 4 mm. Electronically Signed   By: Ivar Drape M.D.   On: 12/29/2017 13:59   Ct Head Wo Contrast  Result Date: 12/25/2017 CLINICAL DATA:  Follow-up hemorrhage. EXAM: CT HEAD WITHOUT CONTRAST TECHNIQUE: Contiguous axial images were obtained from the base of the skull through the vertex without intravenous contrast. COMPARISON:   12/18/2017 FINDINGS: Brain:  A large hemorrhage following the right lateral ventricle has decreased in bulk, when remeasured at the level of the lateral ventricle it is 2.4 cm in diameter compared to 2.9 cm previously. Small volume blood layering in the occipital horn of left lateral ventricle is unchanged. There is increased edema about the hematoma, best seen at the level of the right temporal lobe. This low-density does not involve gray matter to clearly suggest an underlying infarct. The septum pellucidum is displaced by blood clot but there is no generalized midline shift. Generalized atrophy. Vascular: Negative Skull: No acute finding. Sinuses/Orbits: No acute finding IMPRESSION: 1. Interval mild contraction of clot in the right temporal lobe and right more than left lateral ventricles. 2. No new/progressive hemorrhage. 3. Increased vasogenic edema in the right temporal lobe. Electronically Signed   By: Monte Fantasia M.D.   On: 12/25/2017 16:01   Dg Chest Port 1 View  Result Date: 12/25/2017 CLINICAL DATA:  Fever. EXAM: PORTABLE CHEST 1 VIEW COMPARISON:  04/10/2015 FINDINGS: Cardiac silhouette is mildly enlarged. No mediastinal or hilar masses. There is no evidence of adenopathy. Are prominent bronchovascular markings most evident the bases. Additional streaky opacities noted at the left lung base, likely atelectasis. No convincing pneumonia. No pulmonary edema. No pleural effusion or pneumothorax. Skeletal structures are demineralized but grossly intact. IMPRESSION: No acute cardiopulmonary disease. Electronically Signed   By: Lajean Manes M.D.   On: 12/25/2017 13:27    Labs:  Basic Metabolic Panel: BMP Latest Ref Rng & Units 01/18/2018 01/12/2018 01/11/2018  Glucose 70 - 99 mg/dL 106(H) 150(H) 98  BUN 8 - 23 mg/dL 25(H) 38(H) 36(H)  Creatinine 0.44 - 1.00 mg/dL 0.77 0.98 0.74  BUN/Creat Ratio 12 - 28 - - -  Sodium 135 - 145 mmol/L 132(L) 131(L) 130(L)  Potassium 3.5 - 5.1 mmol/L 3.4(L) 3.9  5.3(H)  Chloride 98 - 111 mmol/L 97(L) 97(L) 98  CO2 22 - 32 mmol/L 26 24 25   Calcium 8.9 - 10.3 mg/dL 8.4(L) 9.0 8.6(L)    CBC: CBC Latest Ref Rng & Units 01/11/2018 01/06/2018 01/04/2018  WBC 4.0 - 10.5 K/uL 18.2(H) 19.3(H) 21.7(H)  Hemoglobin 12.0 - 15.0 g/dL 13.4 13.6 13.9  Hematocrit 36.0 - 46.0 % 40.7 41.8 42.7  Platelets 150 - 400 K/uL 202 387 430(H)    CBG: No results for input(s): GLUCAP in the last 168 hours.  Brief HPI:   Dulce Martian. Renee Rival is an 84 year old female with history of mild dementia, CAF--on Xarelto, pulmonary hypertension, Barrett's esophagus; who was admitted on 12/18/2017 with complaints of headache and blurry vision.  CT of head done showing large intraventricular hemorrhage filling most of right ventricle with extension into unit lateral right lateral ventricle with developing hydrocephalus and 8 mm right to left shift.  CTA head negative for aneurysm or AVM and bleed was felt to be due to amyloid angiopathy versus hypertension.  Elevated blood pressure treated with Cleviprex and Xarelto was reversed.  Dr. Kathyrn Sheriff recommended systolic blood pressure goals less than 160 and no surgical intervention needed.  Hospital course significant for lethargy with difficulty keeping eyes open, E. coli UTI, urinary retention as well as labile blood pressures.  Patient with resultant visual field deficits with right gaze preference, visual hallucination, pusher tendencies with left bias on standing.  CIR recommended due to functional deficits  Physical Exam  Constitutional: She appears well-developed and well-nourished. She appears lethargic. She is easily aroused. No distress.  Refused to open eyes this am.   HENT:  Head: Normocephalic and atraumatic.  Eyes: Pupils are equal, round, and reactive to light. Conjunctivae and EOM are normal.  Neck: Normal range of motion. Neck supple.  Cardiovascular: Normal rate. An irregularly irregular rhythm present.  Pulmonary/Chest: No  stridor. No respiratory distress. She has decreased breath sounds.  Abdominal: Soft. Bowel sounds are normal. She exhibits no distension. There is no tenderness.  Musculoskeletal: She exhibits no edema.  Neurological: She is easily aroused. She appears lethargic.  Did arouse but did not want to open eyes. Left inattention continues. Able to move limbs briefly with tactile cues.   Skin: Skin is warm and dry. She is not diaphoretic.  Psychiatric: Her affect is blunt. Her speech is delayed and tangential. She is slowed. Cognition and memory are impaired. She expresses inappropriate judgment.  Vitals reviewed.   Hospital Course: SHENEQUA HOWSE was admitted to rehab 12/23/2017 for inpatient therapies to consist of PT, ST and OT at least three hours five days a week. Past admission physiatrist, therapy team and rehab RN have worked together to provide customized collaborative inpatient rehab. Past admission she continued to have bouts of lethargy with poor p.o. Intake as well as reports of severe headaches she was started on low-dose Topamax in addition to as needed hydrocodone to help manage her symptoms.  IV ceftriaxone was changed to amoxicillin to complete 1 additional days of antibiotic regimen for treatment of E. coli UTI.  Foley was discontinued and she was started on bladder program.  With improvement in activity tolerance and mentation, her urinary retention has resolved but she continues to be incontinent of bowel and bladder.    She did have worsening of lethargy development of fevers up to 102 on 11/8.  She was pancultured and started on broad-spectrum IV antibiotics due to concerns of sepsis blood cultures x2 were negative.  Chest x-ray and urine culture was negative for infection.  CT head repeated as part of work-up and showed some increase in vasogenic edema in right temporal lobe and no new or increase in hemorrhage.  Neurosurgery felt that no surgical intervention was needed and to continue  conservative management. She was started on IV fluids for hydration due to acute renal failure and nutritional supplements have been offered to help with intake.   Internal medicine was consulted for input and question aspiration as cause of fevers.  As work-up negative the antibiotics were DC'd.  Palliative care was consulted to get input on goals of care and family has elected on DNR a and declined feeding tube to help with nutritional and declined feeding tube to help with nutritional intake.  Hyperglycemia due to steroids was treated with sliding scale insulin.  She did report increase in headaches on 11/12 L for follow-up CT head showed evolutionary changes with no new changes.  She was started on trial of IV steroids on 11/11 with improvement and this was slowly tapered off by 11/25.   Provigil was also added on 11/14 to help with activation and energy levels. Her intake is improving with encouragement as well as supplement of meals from home.  She continues to be incontinent of bowel and bladder.  She did have an episode of blank stare with gaze deviation to the left with question of seizure.  Repeat CT head 11/25 showed some decrease in midline shift and EEG showed generalized nonspecific encephalopathy without seizure or seizure predisposition.  Provigil was DC'd on 11/26 and she was started on low-dose Keppra for seizure prophylaxis.  On 11/27,  patient had a vasovagal episode while on the commode.  She recovered after brief episode of unresponsiveness. She has developed orthostatic symptoms in part due to poor p.o. intake.  Hydralazine was DC'd and Avapro was decreased to 250 mg daily today .  Nursing did report foul-smelling concentrated urine with positive UA and she was started on Keflex for treatment.  Urine culture 11/29 showed E. Coli and Pseudomonas--sensitivities were available prior to discharge showing Pseudomonas resistant to Keflex. She was treated with one dose fosfomycin prior to  discharge after discussion with pharmacy. Quinolones avoided due to risk of lowering seizure potential.    Her urine output is decreasing due to poor fluid intake and she was treated with fluid bolus prior to discharge. Recommend offering fluids frequently as well as hydrating with IV fluids as indicated.  Reactive leukocytosis is resolving with weaning of steroids.  She continues to be limited by significant neurological deficits deficits with poor endurance and fluctuating participation.  She requires extensive care and family has elected on SNF for progressive therapy.  She was discharged to [facility] on 01/18/2018   Rehab course: During patient's stay in rehab weekly team conferences were held to monitor patient's progress, set goals and discuss barriers to discharge. At admission, patient required a total assist with mobility and ADL tasks.  Higher-level cognition was limited by lethargy and she required max to total assist to complete basic tasks.  She was maintained on dysphagia 2 diet with thin liquids due to cognitively-based dysphagia with decrease in attention and fluctuating alertness.  She was unable to meet goals set but is showing some balance, postural control, awareness, coordination and use of LUE and LLE.    She continues to be limited by cognitive deficits with delayed processing, poor postural control, orthostatic hypotension as well as apraxia limiting safety with activity.  She requires max to total assist for bathing and dressing tasks.  She currently requires mod to max assist for bed mobility. She is able to get get to sitting at edge of bed with max assist and verbal cues for technique.  She is able to maintain sitting balance at edge of bed with min to mod assist and cues to help correct left lateral lean.  She requires max assist with verbal and tactile cues for self-feeding.  She requires max verbal cues to sustain attention to task for 3 to 5 minutes.    Disposition: Skilled  nursing facility  Diet: Dysphagia 2 diet, thin liquids. Medications whole with puree.   Special Instructions: 1.  Encourage p.o. intake.   Provide supervision at meals for safety. Offer fluids/supplements between meals. 2. Toilet patient every 4 hours while awake.  3. Recheck BMET in 2-3 days to monitor hydration/K+ levels.   Discharge Instructions    Ambulatory referral to Physical Medicine Rehab   Complete by:  As directed    4-6 weeks follow up appointment    2.  Toilet patients during the day.   Allergies as of 01/18/2018      Reactions   Amlodipine Shortness Of Breath, Swelling   Edema in legs   Methyldopa Shortness Of Breath, Swelling   Actonel [risedronate Sodium] Other (See Comments)   Caused a lump to come up in throat   Ciprofloxacin Hcl    hallucinations   Hctz [hydrochlorothiazide] Itching   Pt reports causing "itching under the skin, with no rash noted."   Hydralazine Hcl    Unknown, per pt   Lisinopril Cough  Milk-related Compounds    diarrhea   Oxycodone Hcl    hallucinations   Spironolactone    Itchiing   Tape Itching   Valsartan    Unknown, per pt      Medication List    STOP taking these medications   ALKA-SELTZER PLUS COLD PO   cephALEXin 500 MG capsule Commonly known as:  KEFLEX   LORazepam 0.5 MG tablet Commonly known as:  ATIVAN   magnesium chloride 64 MG Tbec SR tablet Commonly known as:  SLOW-MAG   oxybutynin 5 MG tablet Commonly known as:  DITROPAN   telmisartan 20 MG tablet Commonly known as:  MICARDIS Replaced by:  irbesartan 150 MG tablet     TAKE these medications   acetaminophen 325 MG tablet Commonly known as:  TYLENOL Take 2 tablets (650 mg total) by mouth every 6 (six) hours. What changed:    medication strength  how much to take  when to take this  reasons to take this   calcium carbonate 600 MG Tabs tablet Commonly known as:  OS-CAL Take 600 mg by mouth daily with breakfast.   carboxymethylcellulose  0.5 % Soln Commonly known as:  REFRESH PLUS Place 1 drop into both eyes as needed (dry eyes).   docusate sodium 100 MG capsule Commonly known as:  COLACE Take 1 capsule (100 mg total) by mouth 2 (two) times daily.   irbesartan 150 MG tablet Commonly known as:  AVAPRO Take 1 tablet (150 mg total) by mouth daily. Start taking on:  01/19/2018 Replaces:  telmisartan 20 MG tablet   levETIRAcetam 500 MG tablet Commonly known as:  KEPPRA Take 1 tablet (500 mg total) by mouth 2 (two) times daily.   magnesium oxide 400 (241.3 Mg) MG tablet Commonly known as:  MAG-OX Take 0.5 tablets (200 mg total) by mouth 2 (two) times daily.   metoprolol tartrate 50 MG tablet Commonly known as:  LOPRESSOR Take 1 tablet (50 mg total) by mouth 2 (two) times daily. What changed:    medication strength  how much to take   mouth rinse Liqd solution 15 mLs by Mouth Rinse route 2 (two) times daily.   multivitamin with minerals Tabs tablet Take 1 tablet by mouth daily. Start taking on:  01/19/2018   MUSCLE RUB 10-15 % Crea Apply 1 application topically every 6 (six) hours. To bilateral knees   nitroGLYCERIN 0.4 MG SL tablet Commonly known as:  NITROSTAT Place 1 tablet (0.4 mg total) under the tongue every 5 (five) minutes as needed for chest pain (3 doses only).   omeprazole 20 MG capsule Commonly known as:  PRILOSEC Take 20 mg by mouth daily.   polyethylene glycol packet Commonly known as:  MIRALAX / GLYCOLAX Take 17 g by mouth daily. Start taking on:  01/19/2018   potassium chloride SA 20 MEQ tablet Commonly known as:  K-DUR,KLOR-CON Take 1 tablet by mouth daily.   ranitidine 150 MG/10ML syrup Commonly known as:  ZANTAC Take 10 mLs (150 mg total) by mouth 2 (two) times daily.   simethicone 80 MG chewable tablet Commonly known as:  MYLICON Chew 1 tablet (80 mg total) by mouth 4 (four) times daily as needed for flatulence.   tamsulosin 0.4 MG Caps capsule Commonly known as:   FLOMAX Take 1 capsule (0.4 mg total) by mouth daily after supper.   vitamin B-12 1000 MCG tablet Commonly known as:  CYANOCOBALAMIN Take 1,000 mcg by mouth daily.   vitamin C 500 MG tablet Commonly  known as:  ASCORBIC ACID Take 500 mg by mouth daily.   Vitamin D-3 25 MCG (1000 UT) Caps Take 1 capsule by mouth daily.   vitamin E 400 UNIT capsule Take 400 Units by mouth daily.       Contact information for follow-up providers    Jamse Arn, MD Follow up.   Specialty:  Physical Medicine and Rehabilitation Why:  office will call you for follow up appointment Contact information: [place] [address] [phone]            Contact information for after-discharge care    Destination    [facility] Preferred SNF .   Service:  Skilled Nursing Contact information: [address] [place] [phone]                  Signed: Bary Leriche 01/18/2018, 1:09 PM   Patient seen and examined by me on day of discharge. Delice Lesch, MD, ABPMR

## 2018-01-18 NOTE — Progress Notes (Signed)
Speech Language Pathology Discharge Summary  Patient Details  Name: [name] MRN: [id number] Date of Birth: [birth year]  Today's Date: 01/18/2018 SLP Individual Time: 0900-0945 SLP Individual Time Calculation (min): 45 min   Skilled Therapeutic Interventions:   Skilled ST services focused on swallow, cognitive skills and education. Pt was asleep upon entering room and required max A verbal cues for focused attention. SLP facilitated PO consumption of dys 1 and thin via straw, pt required max A verbal/taticle cues during self feeding. Pt required max A verbal cues for sustained attention to task in 3-5 minute intervals during functional task, pt eyes remained slightly open. SLP provided education of safe PO consumption and continued ST services, pt stated agreement. Pt was left in room with call bell within reach and bed alarm set. SLP reccomends to continue skilled services.    Patient has met 4 of 5 long term goals.  Patient to discharge at [facility] Max;Mod level.  Reasons goals not met:   Lethargy   Clinical Impression/Discharge Summary:   Pt met 4 out 5 goals, discharging at mainly max A. Pt's performance was largely impacted by continued fatigue and lethargy resulting in reduced participation. Pt demonstrated ability to feed self, consuming dys 2 and thin via cup/straw, bring attention to midline, basic problem solving, emergent awareness  and sustained attention during alert periods requiring max A multimodal cues. Pt would continue to benefit from skilled ST services in order to maximize functional independence and reduce burden of care recurring 24 hour supervision at SNF placement.  Care Partner:  Caregiver Able to Provide Assistance: Yes  Type of Caregiver Assistance: Physical;Cognitive  Recommendation:  Skilled Nursing facility;24 hour supervision/assistance  Rationale for SLP Follow Up: Maximize functional communication;Maximize cognitive function and independence;Maximize  swallowing safety;Reduce caregiver burden   Equipment:   N/A  Reasons for discharge: Discharged from hospital   Patient/Family Agrees with Progress Made and Goals Achieved: Yes    [name]  [facility] 01/18/2018, 10:32 AM

## 2018-01-18 NOTE — Progress Notes (Addendum)
Please also see discharge summary.  Weekend notes reviewed, no reported issues.  However patient noted to have orthostasis as well as UTI.  Patient started on empiric Keflex, awaiting culture sensitivities.  Will decrease dose of Avapro and DC hydralazine.  We will also give fluid bolus.  Labs reviewed, potassium low, will supplement.  Persistent, but improved WBCs.  Persistent and stable hyponatremia.  Acute blood loss anemia noted.  Discharge to SNF today.  Will see patient for hospital follow-up in 1 month post discharge.  Greater than 35 minutes spent in total regarding discharge planning, with greater than 30 minutes in counseling and coordination of care with daughter regarding aforementioned as well as lethargy and prognosis.

## 2018-01-18 NOTE — Progress Notes (Signed)
Social Work Patient ID: [name], female   DOB: [birth year], 84 y.o.   MRN: [id number] Pt medically stable to transfer to [facility], ambulance called for 2:00 pm. RN given number for report. Pt set for discharge.

## 2018-01-18 NOTE — Progress Notes (Signed)
Patient discharged to [facility]. Report called to  [facility]. Daughter was present for discharge and all belongings accounted for.

## 2018-01-19 NOTE — Progress Notes (Signed)
Social Work Discharge Note  The overall goal for the admission was met for:   Discharge location: No - pt required SNF tx and dtr was hoping pt could return home initially. Pt tx to [facility].  Length of Stay: Yes - 26 days  Discharge activity level: No - pt was max to max A x 2  Home/community participation: No  Services provided included: MD, RD, PT, OT, SLP, RN, Pharmacy and SW  Financial Services: Medicare and Private Insurance: Blue [employer] Blue Shield supplement  Follow-up services arranged: Other: Pt required tx to SNF.  Comments (or additional information): Pt tx to SNF via [facility] to continue rehabilitation.   Patient/Family verbalized understanding of follow-up arrangements: Yes  Individual responsible for coordination of the follow-up plan: SNF and pt's dtr  Confirmed correct DME delivered: Trey Sailors 01/19/2018    Izzy Courville, Silvestre Mesi

## 2018-01-21 DIAGNOSIS — I615 Nontraumatic intracerebral hemorrhage, intraventricular: Secondary | ICD-10-CM | POA: Diagnosis not present

## 2018-01-21 DIAGNOSIS — I10 Essential (primary) hypertension: Secondary | ICD-10-CM | POA: Diagnosis not present

## 2018-01-21 DIAGNOSIS — D649 Anemia, unspecified: Secondary | ICD-10-CM | POA: Diagnosis not present

## 2018-01-21 DIAGNOSIS — R5383 Other fatigue: Secondary | ICD-10-CM | POA: Diagnosis not present

## 2018-01-26 DIAGNOSIS — I10 Essential (primary) hypertension: Secondary | ICD-10-CM | POA: Diagnosis not present

## 2018-01-26 DIAGNOSIS — R1310 Dysphagia, unspecified: Secondary | ICD-10-CM | POA: Diagnosis not present

## 2018-01-26 DIAGNOSIS — I615 Nontraumatic intracerebral hemorrhage, intraventricular: Secondary | ICD-10-CM | POA: Diagnosis not present

## 2018-01-26 DIAGNOSIS — R5383 Other fatigue: Secondary | ICD-10-CM | POA: Diagnosis not present

## 2018-02-08 ENCOUNTER — Ambulatory Visit: Payer: Medicare Other | Admitting: Cardiovascular Disease

## 2018-02-16 DIAGNOSIS — G3184 Mild cognitive impairment, so stated: Secondary | ICD-10-CM | POA: Diagnosis not present

## 2018-02-16 DIAGNOSIS — I615 Nontraumatic intracerebral hemorrhage, intraventricular: Secondary | ICD-10-CM | POA: Diagnosis not present

## 2018-02-16 DIAGNOSIS — I10 Essential (primary) hypertension: Secondary | ICD-10-CM | POA: Diagnosis not present

## 2018-02-16 DIAGNOSIS — R600 Localized edema: Secondary | ICD-10-CM | POA: Diagnosis not present

## 2018-02-17 ENCOUNTER — Other Ambulatory Visit: Payer: Self-pay

## 2018-02-17 ENCOUNTER — Emergency Department: Payer: Medicare Other

## 2018-02-17 ENCOUNTER — Inpatient Hospital Stay
Admission: EM | Admit: 2018-02-17 | Discharge: 2018-02-20 | DRG: 689 | Disposition: A | Discharge status: Skilled Nursing Facility | Payer: Medicare Other | Source: Skilled Nursing Facility | Attending: Internal Medicine | Admitting: Internal Medicine

## 2018-02-17 DIAGNOSIS — Z8249 Family history of ischemic heart disease and other diseases of the circulatory system: Secondary | ICD-10-CM | POA: Diagnosis not present

## 2018-02-17 DIAGNOSIS — Z87891 Personal history of nicotine dependence: Secondary | ICD-10-CM | POA: Diagnosis not present

## 2018-02-17 DIAGNOSIS — Z91048 Other nonmedicinal substance allergy status: Secondary | ICD-10-CM

## 2018-02-17 DIAGNOSIS — E860 Dehydration: Secondary | ICD-10-CM | POA: Diagnosis present

## 2018-02-17 DIAGNOSIS — I10 Essential (primary) hypertension: Secondary | ICD-10-CM | POA: Diagnosis present

## 2018-02-17 DIAGNOSIS — F32A Depression, unspecified: Secondary | ICD-10-CM

## 2018-02-17 DIAGNOSIS — K2270 Barrett's esophagus without dysplasia: Secondary | ICD-10-CM | POA: Diagnosis present

## 2018-02-17 DIAGNOSIS — Z79899 Other long term (current) drug therapy: Secondary | ICD-10-CM

## 2018-02-17 DIAGNOSIS — M6281 Muscle weakness (generalized): Secondary | ICD-10-CM | POA: Diagnosis not present

## 2018-02-17 DIAGNOSIS — F0390 Unspecified dementia without behavioral disturbance: Secondary | ICD-10-CM | POA: Diagnosis present

## 2018-02-17 DIAGNOSIS — I25118 Atherosclerotic heart disease of native coronary artery with other forms of angina pectoris: Secondary | ICD-10-CM | POA: Diagnosis present

## 2018-02-17 DIAGNOSIS — Z833 Family history of diabetes mellitus: Secondary | ICD-10-CM | POA: Diagnosis not present

## 2018-02-17 DIAGNOSIS — Z803 Family history of malignant neoplasm of breast: Secondary | ICD-10-CM

## 2018-02-17 DIAGNOSIS — R2689 Other abnormalities of gait and mobility: Secondary | ICD-10-CM | POA: Diagnosis not present

## 2018-02-17 DIAGNOSIS — R569 Unspecified convulsions: Secondary | ICD-10-CM | POA: Diagnosis not present

## 2018-02-17 DIAGNOSIS — Z7901 Long term (current) use of anticoagulants: Secondary | ICD-10-CM

## 2018-02-17 DIAGNOSIS — I482 Chronic atrial fibrillation, unspecified: Secondary | ICD-10-CM | POA: Diagnosis present

## 2018-02-17 DIAGNOSIS — I959 Hypotension, unspecified: Secondary | ICD-10-CM | POA: Diagnosis not present

## 2018-02-17 DIAGNOSIS — M1990 Unspecified osteoarthritis, unspecified site: Secondary | ICD-10-CM | POA: Diagnosis present

## 2018-02-17 DIAGNOSIS — J150 Pneumonia due to Klebsiella pneumoniae: Secondary | ICD-10-CM | POA: Diagnosis present

## 2018-02-17 DIAGNOSIS — W19XXXA Unspecified fall, initial encounter: Secondary | ICD-10-CM | POA: Diagnosis present

## 2018-02-17 DIAGNOSIS — I69391 Dysphagia following cerebral infarction: Secondary | ICD-10-CM | POA: Diagnosis not present

## 2018-02-17 DIAGNOSIS — G9389 Other specified disorders of brain: Secondary | ICD-10-CM | POA: Diagnosis not present

## 2018-02-17 DIAGNOSIS — Z8349 Family history of other endocrine, nutritional and metabolic diseases: Secondary | ICD-10-CM | POA: Diagnosis not present

## 2018-02-17 DIAGNOSIS — N390 Urinary tract infection, site not specified: Principal | ICD-10-CM | POA: Diagnosis present

## 2018-02-17 DIAGNOSIS — Z885 Allergy status to narcotic agent status: Secondary | ICD-10-CM

## 2018-02-17 DIAGNOSIS — I248 Other forms of acute ischemic heart disease: Secondary | ICD-10-CM | POA: Diagnosis present

## 2018-02-17 DIAGNOSIS — Z87440 Personal history of urinary (tract) infections: Secondary | ICD-10-CM | POA: Diagnosis not present

## 2018-02-17 DIAGNOSIS — E569 Vitamin deficiency, unspecified: Secondary | ICD-10-CM | POA: Diagnosis not present

## 2018-02-17 DIAGNOSIS — Z8673 Personal history of transient ischemic attack (TIA), and cerebral infarction without residual deficits: Secondary | ICD-10-CM | POA: Diagnosis not present

## 2018-02-17 DIAGNOSIS — N3000 Acute cystitis without hematuria: Secondary | ICD-10-CM | POA: Diagnosis not present

## 2018-02-17 DIAGNOSIS — M2550 Pain in unspecified joint: Secondary | ICD-10-CM | POA: Diagnosis not present

## 2018-02-17 DIAGNOSIS — I2510 Atherosclerotic heart disease of native coronary artery without angina pectoris: Secondary | ICD-10-CM | POA: Diagnosis not present

## 2018-02-17 DIAGNOSIS — F419 Anxiety disorder, unspecified: Secondary | ICD-10-CM | POA: Diagnosis present

## 2018-02-17 DIAGNOSIS — I4821 Permanent atrial fibrillation: Secondary | ICD-10-CM | POA: Diagnosis not present

## 2018-02-17 DIAGNOSIS — Z741 Need for assistance with personal care: Secondary | ICD-10-CM | POA: Diagnosis not present

## 2018-02-17 DIAGNOSIS — Z801 Family history of malignant neoplasm of trachea, bronchus and lung: Secondary | ICD-10-CM

## 2018-02-17 DIAGNOSIS — I081 Rheumatic disorders of both mitral and tricuspid valves: Secondary | ICD-10-CM | POA: Diagnosis present

## 2018-02-17 DIAGNOSIS — I639 Cerebral infarction, unspecified: Secondary | ICD-10-CM | POA: Diagnosis not present

## 2018-02-17 DIAGNOSIS — R279 Unspecified lack of coordination: Secondary | ICD-10-CM | POA: Diagnosis not present

## 2018-02-17 DIAGNOSIS — R0902 Hypoxemia: Secondary | ICD-10-CM | POA: Diagnosis not present

## 2018-02-17 DIAGNOSIS — K219 Gastro-esophageal reflux disease without esophagitis: Secondary | ICD-10-CM | POA: Diagnosis present

## 2018-02-17 DIAGNOSIS — R531 Weakness: Secondary | ICD-10-CM | POA: Diagnosis not present

## 2018-02-17 DIAGNOSIS — R5381 Other malaise: Secondary | ICD-10-CM | POA: Diagnosis present

## 2018-02-17 DIAGNOSIS — R404 Transient alteration of awareness: Secondary | ICD-10-CM | POA: Diagnosis not present

## 2018-02-17 DIAGNOSIS — I69115 Cognitive social or emotional deficit following nontraumatic intracerebral hemorrhage: Secondary | ICD-10-CM | POA: Diagnosis not present

## 2018-02-17 DIAGNOSIS — R55 Syncope and collapse: Secondary | ICD-10-CM | POA: Diagnosis not present

## 2018-02-17 DIAGNOSIS — Z90710 Acquired absence of both cervix and uterus: Secondary | ICD-10-CM

## 2018-02-17 DIAGNOSIS — I119 Hypertensive heart disease without heart failure: Secondary | ICD-10-CM | POA: Diagnosis not present

## 2018-02-17 DIAGNOSIS — I69191 Dysphagia following nontraumatic intracerebral hemorrhage: Secondary | ICD-10-CM | POA: Diagnosis not present

## 2018-02-17 DIAGNOSIS — Z66 Do not resuscitate: Secondary | ICD-10-CM | POA: Diagnosis present

## 2018-02-17 DIAGNOSIS — R2681 Unsteadiness on feet: Secondary | ICD-10-CM | POA: Diagnosis not present

## 2018-02-17 DIAGNOSIS — Z7401 Bed confinement status: Secondary | ICD-10-CM | POA: Diagnosis not present

## 2018-02-17 DIAGNOSIS — Z888 Allergy status to other drugs, medicaments and biological substances status: Secondary | ICD-10-CM

## 2018-02-17 DIAGNOSIS — E46 Unspecified protein-calorie malnutrition: Secondary | ICD-10-CM | POA: Diagnosis not present

## 2018-02-17 DIAGNOSIS — I619 Nontraumatic intracerebral hemorrhage, unspecified: Secondary | ICD-10-CM | POA: Diagnosis not present

## 2018-02-17 DIAGNOSIS — Z955 Presence of coronary angioplasty implant and graft: Secondary | ICD-10-CM

## 2018-02-17 DIAGNOSIS — E785 Hyperlipidemia, unspecified: Secondary | ICD-10-CM | POA: Diagnosis present

## 2018-02-17 DIAGNOSIS — I272 Pulmonary hypertension, unspecified: Secondary | ICD-10-CM | POA: Diagnosis present

## 2018-02-17 HISTORY — DX: Depression, unspecified: F32.A

## 2018-02-17 LAB — BASIC METABOLIC PANEL
Anion gap: 7 (ref 5–15)
BUN: 23 mg/dL (ref 8–23)
CO2: 27 mmol/L (ref 22–32)
Calcium: 8.8 mg/dL — ABNORMAL LOW (ref 8.9–10.3)
Chloride: 103 mmol/L (ref 98–111)
Creatinine, Ser: 0.78 mg/dL (ref 0.44–1.00)
GFR calc Af Amer: >60 mL/min (ref >60)
GFR calc non Af Amer: >60 mL/min (ref >60)
Glucose, Bld: 155 mg/dL — ABNORMAL HIGH (ref 70–99)
Potassium: 3.6 mmol/L (ref 3.5–5.1)
Sodium: 137 mmol/L (ref 135–145)

## 2018-02-17 LAB — BRAIN NATRIURETIC PEPTIDE: B Natriuretic Peptide: 324 pg/mL — ABNORMAL HIGH (ref 0.0–100.0)

## 2018-02-17 LAB — CBC
HCT: 35.1 % — ABNORMAL LOW (ref 36.0–46.0)
Hemoglobin: 11.4 g/dL — ABNORMAL LOW (ref 12.0–15.0)
MCH: 30 pg (ref 26.0–34.0)
MCHC: 32.5 g/dL (ref 30.0–36.0)
MCV: 92.4 fL (ref 80.0–100.0)
Platelets: 275 10*3/uL (ref 150–400)
RBC: 3.8 MIL/uL — ABNORMAL LOW (ref 3.87–5.11)
RDW: 15.3 % (ref 11.5–15.5)
WBC: 9.3 10*3/uL (ref 4.0–10.5)
nRBC: 0 % (ref 0.0–0.2)

## 2018-02-17 LAB — URINALYSIS, COMPLETE (UACMP) WITH MICROSCOPIC
Bilirubin Urine: NEGATIVE
GLUCOSE, UA: NEGATIVE mg/dL
Hgb urine dipstick: NEGATIVE
Ketones, ur: NEGATIVE mg/dL
Nitrite: POSITIVE — AB
Protein, ur: 30 mg/dL — AB
Specific Gravity, Urine: 1.025 (ref 1.005–1.030)
WBC, UA: >50 WBC/hpf — ABNORMAL HIGH (ref 0–5)
pH: 5 (ref 5.0–8.0)

## 2018-02-17 LAB — TROPONIN I
Troponin I: 0.03 ng/mL (ref <0.03)
Troponin I: 0.03 ng/mL (ref <0.03)

## 2018-02-17 MED ORDER — POTASSIUM CHLORIDE CRYS ER 20 MEQ PO TBCR
20.0000 meq | EXTENDED_RELEASE_TABLET | Freq: Every day | ORAL | Status: DC
  Administered 2018-02-18 – 2018-02-20 (×3): 20 meq via ORAL
  Filled 2018-02-17 (×2): qty 1
  Filled 2018-02-17: qty 2

## 2018-02-17 MED ORDER — SODIUM CHLORIDE 0.9 % IV SOLN
1.0000 g | Freq: Two times a day (BID) | INTRAVENOUS | Status: DC
  Administered 2018-02-17 – 2018-02-19 (×5): 1 g via INTRAVENOUS
  Filled 2018-02-17 (×7): qty 1

## 2018-02-17 MED ORDER — NITROGLYCERIN 0.4 MG SL SUBL: 0.4000 mg | SUBLINGUAL_TABLET | SUBLINGUAL | Status: DC | PRN

## 2018-02-17 MED ORDER — IRBESARTAN 150 MG PO TABS
150.0000 mg | ORAL_TABLET | Freq: Every day | ORAL | Status: DC
  Administered 2018-02-17 – 2018-02-20 (×4): 150 mg via ORAL
  Filled 2018-02-17 (×4): qty 1

## 2018-02-17 MED ORDER — PIPERACILLIN-TAZOBACTAM 3.375 G IVPB 30 MIN: 3.3750 g | Freq: Three times a day (TID) | INTRAVENOUS | Status: DC

## 2018-02-17 MED ORDER — VITAMIN D3 25 MCG (1000 UNIT) PO TABS
1000.0000 [IU] | ORAL_TABLET | Freq: Every day | ORAL | Status: DC
  Administered 2018-02-18 – 2018-02-20 (×3): 1000 [IU] via ORAL
  Filled 2018-02-17 (×4): qty 1

## 2018-02-17 MED ORDER — MAGNESIUM OXIDE 400 (241.3 MG) MG PO TABS
200.0000 mg | ORAL_TABLET | Freq: Two times a day (BID) | ORAL | Status: DC
  Administered 2018-02-17 – 2018-02-20 (×6): 200 mg via ORAL
  Filled 2018-02-17 (×6): qty 1

## 2018-02-17 MED ORDER — LEVETIRACETAM 500 MG PO TABS
500.0000 mg | ORAL_TABLET | Freq: Two times a day (BID) | ORAL | Status: DC
  Administered 2018-02-17 – 2018-02-20 (×6): 500 mg via ORAL
  Filled 2018-02-17 (×6): qty 1

## 2018-02-17 MED ORDER — ACETAMINOPHEN 325 MG PO TABS: 650.0000 mg | ORAL_TABLET | Freq: Four times a day (QID) | ORAL | Status: DC | PRN

## 2018-02-17 MED ORDER — SODIUM CHLORIDE 0.9 % IV SOLN: 1.0000 g | Freq: Once | INTRAVENOUS | Status: DC

## 2018-02-17 MED ORDER — ACETAMINOPHEN 650 MG RE SUPP: 650.0000 mg | Freq: Four times a day (QID) | RECTAL | Status: DC | PRN

## 2018-02-17 MED ORDER — ORAL CARE MOUTH RINSE
15.0000 mL | Freq: Two times a day (BID) | OROMUCOSAL | Status: DC
  Administered 2018-02-19 – 2018-02-20 (×3): 15 mL via OROMUCOSAL

## 2018-02-17 MED ORDER — METOPROLOL TARTRATE 50 MG PO TABS
50.0000 mg | ORAL_TABLET | Freq: Two times a day (BID) | ORAL | Status: DC
  Administered 2018-02-17 – 2018-02-20 (×6): 50 mg via ORAL
  Filled 2018-02-17 (×6): qty 1

## 2018-02-17 MED ORDER — VITAMIN C 500 MG PO TABS
500.0000 mg | ORAL_TABLET | Freq: Every day | ORAL | Status: DC
  Administered 2018-02-17 – 2018-02-20 (×4): 500 mg via ORAL
  Filled 2018-02-17 (×4): qty 1

## 2018-02-17 MED ORDER — TAMSULOSIN HCL 0.4 MG PO CAPS
0.4000 mg | ORAL_CAPSULE | Freq: Every day | ORAL | Status: DC
  Administered 2018-02-17 – 2018-02-19 (×3): 0.4 mg via ORAL
  Filled 2018-02-17 (×3): qty 1

## 2018-02-17 MED ORDER — ADULT MULTIVITAMIN W/MINERALS CH
1.0000 | ORAL_TABLET | Freq: Every day | ORAL | Status: DC
  Administered 2018-02-17 – 2018-02-20 (×4): 1 via ORAL
  Filled 2018-02-17 (×4): qty 1

## 2018-02-17 MED ORDER — CALCIUM CARBONATE ANTACID 500 MG PO CHEW
3.0000 | CHEWABLE_TABLET | Freq: Every day | ORAL | Status: DC
  Administered 2018-02-18 – 2018-02-20 (×3): 600 mg via ORAL
  Filled 2018-02-17 (×3): qty 3

## 2018-02-17 MED ORDER — POLYETHYLENE GLYCOL 3350 17 G PO PACK
17.0000 g | PACK | Freq: Every day | ORAL | Status: DC
  Administered 2018-02-18 – 2018-02-20 (×3): 17 g via ORAL
  Filled 2018-02-17 (×3): qty 1

## 2018-02-17 MED ORDER — PANTOPRAZOLE SODIUM 40 MG PO TBEC
40.0000 mg | DELAYED_RELEASE_TABLET | Freq: Every day | ORAL | Status: DC
  Administered 2018-02-18 – 2018-02-20 (×3): 40 mg via ORAL
  Filled 2018-02-17 (×3): qty 1

## 2018-02-17 MED ORDER — SODIUM CHLORIDE 0.9 % IV SOLN
INTRAVENOUS | Status: DC
  Administered 2018-02-17 – 2018-02-18 (×2): via INTRAVENOUS

## 2018-02-17 MED ORDER — SIMETHICONE 80 MG PO CHEW
80.0000 mg | CHEWABLE_TABLET | Freq: Four times a day (QID) | ORAL | Status: DC | PRN
  Filled 2018-02-17: qty 1

## 2018-02-17 MED ORDER — DOCUSATE SODIUM 100 MG PO CAPS
100.0000 mg | ORAL_CAPSULE | Freq: Two times a day (BID) | ORAL | Status: DC
  Administered 2018-02-17 – 2018-02-20 (×6): 100 mg via ORAL
  Filled 2018-02-17 (×6): qty 1

## 2018-02-17 MED ORDER — SODIUM CHLORIDE 0.9 % IV BOLUS
500.0000 mL | Freq: Once | INTRAVENOUS | Status: AC
  Administered 2018-02-17: 500 mL via INTRAVENOUS

## 2018-02-17 MED ORDER — VITAMIN B-12 1000 MCG PO TABS
1000.0000 ug | ORAL_TABLET | Freq: Every day | ORAL | Status: DC
  Administered 2018-02-17 – 2018-02-20 (×4): 1000 ug via ORAL
  Filled 2018-02-17 (×4): qty 1

## 2018-02-17 MED ORDER — VITAMIN E 180 MG (400 UNIT) PO CAPS
400.0000 [IU] | ORAL_CAPSULE | Freq: Every day | ORAL | Status: DC
  Administered 2018-02-18 – 2018-02-20 (×3): 400 [IU] via ORAL
  Filled 2018-02-17 (×4): qty 1

## 2018-02-17 MED ORDER — POLYVINYL ALCOHOL 1.4 % OP SOLN
1.0000 [drp] | OPHTHALMIC | Status: DC | PRN
  Filled 2018-02-17: qty 15

## 2018-02-17 NOTE — Progress Notes (Addendum)
Pharmacy Antibiotic Note  JEANEAN HOLLETT is a 85 y.o. female admitted on 02/17/2018 with UTI.  Pharmacy has been consulted for cefepime dosing. Pt has a history of pseudomonas and ecoli UTI.   Plan: Cefepime 1 g q12H  Height: 5\' 8"  (172.7 cm) Weight: 170 lb (77.1 kg) IBW/kg (Calculated) : 63.9  No data recorded.  Recent Labs  Lab 02/17/18 1251  WBC 9.3  CREATININE 0.78    Estimated Creatinine Clearance: 52.1 mL/min (by C-G formula based on SCr of 0.78 mg/dL).    Allergies  Allergen Reactions  . Amlodipine Shortness Of Breath and Swelling    Edema in legs  . Methyldopa Shortness Of Breath and Swelling  . Actonel [Risedronate Sodium] Other (See Comments)    Caused a lump to come up in throat  . Ciprofloxacin Hcl     hallucinations  . Hctz [Hydrochlorothiazide] Itching    Pt reports causing "itching under the skin, with no rash noted."  . Hydralazine Hcl     Unknown, per pt   . Lisinopril Cough  . Milk-Related Compounds     diarrhea  . Oxycodone Hcl     hallucinations  . Spironolactone     Itchiing  . Tape Itching  . Valsartan     Unknown, per pt    Antimicrobials this admission: 1/1 cefepime >>    Dose adjustments this admission: CrCl 30-60 adjusted to 1 g q12H  Microbiology results: None. Ucx in 01/15/18 positive for pseudomonas and ecoli.    Thank you for allowing pharmacy to be a part of this patient's care.  Oswald Hillock, PharmD, BCPS Clinical Pharmacist 02/17/2018 3:55 PM

## 2018-02-17 NOTE — Progress Notes (Signed)
Advanced care plan. Purpose of the Encounter: CODE STATUS Parties in Attendance:Patient and family Patient's Decision Capacity: Not good Subjective/Patient's story: Presented for fall and passing out Objective/Medical story Patient has history of intracranial hemorrhage She resides at rehab facility She passed out and has UTI and dehydration Needs IV fluids, iv antibioitcs and cardiac monitoring Goals of care determination:  Advance care directives and goals of care and treatment plan discussed with patients family in detail.They do not want any aggressive measures and do not want cpr, intubation and ventilator if need arises. CODE STATUS: DNR Time spent discussing advanced care planning: 16 minutes

## 2018-02-17 NOTE — H&P (Addendum)
[place] [facility] [place] NAME: [name]    MR#:  [id number]  DATE OF BIRTH:  11/10/1926  DATE OF ADMISSION:  02/17/2018  PRIMARY CARE PHYSICIAN: Merrilee Seashore, MD   REQUESTING/REFERRING PHYSICIAN:   CHIEF COMPLAINT:   Chief Complaint  Patient presents with  . Loss of Consciousness    HISTORY OF PRESENT ILLNESS: [name]  is a 85 y.o. female with a known history of intracranial hemorrhage, chronic atrial fibrillation, coronary artery disease with history of stent placement, hiatal hernia, GERD, hypertension, hyperlipidemia who is a resident of [facility] facility.  Patient was recently at [facility] for intracranial hemorrhage then she was also in the rehab at [facility] for a month before moving to the above.  Patient is mostly bed bound.  She started receiving physical therapy recently.  This morning patient was being helped to get onto the commode by physical therapist.  She became dizzy nauseated and passed out.  Patient was worked up with CT head in our emergency room which showed resolution of intracranial hemorrhage but no new hemorrhages.  Her urine was positive for infection.  Old records from [facility] were reviewed.  Patient had Pseudomonas and E. coli UTI in the past and needed IV antibiotics.  Has generalized weakness.  Most of the history obtained from patient's family members were at bedside .Hospitalist service was consulted for further care.  PAST MEDICAL HISTORY:   Past Medical History:  Diagnosis Date  . Ankle fracture   . Anxiety   . Arthritis   . Barrett's esophagus   . Chronic atrial fibrillation    a. Pt previously declined DCCV.  Marland Kitchen Coronary artery disease    a. s/p Cypher DES to LAD in 2006;  b. Last LHC 02/2008:  pLAD 30%, mLAD stent patent, pOM 50%, EF 65%;  c. Lexiscan Myoview 7/14:  Intermediate risk, dist ant and inf-lat ischemia, EF 66% - patient declined cath and  elected medical therapy.  Marland Kitchen GERD (gastroesophageal reflux disease)   . Hiatal hernia   . Hx of echocardiogram    Echocardiogram 08/17/12: EF 55-60%, MAC, mild MR, mild LAE, mild RVE, mild to moderate RAE, moderate TR, mildly increased pulmonary artery systolic pressures  . Hyperlipidemia   . Hypertension   . Mitral regurgitation    a. Mod by echo 10/2014.  . Obesity   . Pulmonary hypertension ([place])    a. Mod-severely elevated PA pressures by echo 10/2014.  Marland Kitchen QT prolongation    a. During 10/2014 admit, QTC 532m.  . Tricuspid regurgitation    a. Mod by echo 10/2014.  .Marland KitchenUTI (lower urinary tract infection) 03/2015    PAST SURGICAL HISTORY:  Past Surgical History:  Procedure Laterality Date  . ABDOMINAL HYSTERECTOMY    . ANKLE SURGERY  2001  . BREAST EXCISIONAL BIOPSY Right 1970  . BREAST EXCISIONAL BIOPSY Right   . BREAST EXCISIONAL BIOPSY Left   . CESAREAN SECTION    . CORONARY ANGIOPLASTY    . ESOPHAGOGASTRODUODENOSCOPY      SOCIAL HISTORY:  Social History   Tobacco Use  . Smoking status: Former Smoker    Types: Cigarettes  . Smokeless tobacco: Never Used  Substance Use Topics  . Alcohol use: No    Alcohol/week: 0.0 standard drinks    FAMILY HISTORY:  Family History  Problem Relation Age of Onset  . Diabetes Mother   . Heart attack Mother   .  Lung cancer Father   . Thyroid disease Sister   . Thyroid disease Sister   . Hypertension Sister   . Hypertension Sister   . Hypertension Sister   . Hypertension Sister   . Hypertension Brother   . Hypertension Brother   . Breast cancer Other 45  . Colon cancer Neg Hx   . Colon polyps Neg Hx   . Esophageal cancer Neg Hx   . Stroke Neg Hx     DRUG ALLERGIES:  Allergies  Allergen Reactions  . Amlodipine Shortness Of Breath and Swelling    Edema in legs  . Methyldopa Shortness Of Breath and Swelling  . Actonel [Risedronate Sodium] Other (See Comments)    Caused a lump to come up in throat  . Ciprofloxacin Hcl      hallucinations  . Hctz [Hydrochlorothiazide] Itching    Pt reports causing "itching under the skin, with no rash noted."  . Hydralazine Hcl     Unknown, per pt   . Lisinopril Cough  . Milk-Related Compounds     diarrhea  . Oxycodone Hcl     hallucinations  . Spironolactone     Itchiing  . Tape Itching  . Valsartan     Unknown, per pt    REVIEW OF SYSTEMS:  Could Not be obtained completely as patient is lethargic and confused  MEDICATIONS AT HOME:  Prior to Admission medications   Medication Sig Start Date End Date Taking? Authorizing Provider  acetaminophen (TYLENOL) 325 MG tablet Take 2 tablets (650 mg total) by mouth every 6 (six) hours. 01/18/18   Love, Ivan Anchors, PA-C  calcium carbonate (OS-CAL) 600 MG TABS Take 600 mg by mouth daily with breakfast.     [provider]  carboxymethylcellulose (REFRESH PLUS) 0.5 % SOLN Place 1 drop into both eyes as needed (dry eyes).    [provider]  Cholecalciferol (VITAMIN D-3) 1000 UNITS CAPS Take 1 capsule by mouth daily.    [provider]  docusate sodium (COLACE) 100 MG capsule Take 1 capsule (100 mg total) by mouth 2 (two) times daily. 01/18/18   Love, Ivan Anchors, PA-C  irbesartan (AVAPRO) 150 MG tablet Take 1 tablet (150 mg total) by mouth daily. 01/19/18   Love, Ivan Anchors, PA-C  levETIRAcetam (KEPPRA) 500 MG tablet Take 1 tablet (500 mg total) by mouth 2 (two) times daily. 12/23/17   Costella, Vista Mink, PA-C  magnesium oxide (MAG-OX) 400 (241.3 Mg) MG tablet Take 0.5 tablets (200 mg total) by mouth 2 (two) times daily. 01/18/18   Love, Ivan Anchors, PA-C  Menthol-Methyl Salicylate (MUSCLE RUB) 10-15 % CREA Apply 1 application topically every 6 (six) hours. To bilateral knees 01/18/18   Love, Ivan Anchors, PA-C  metoprolol tartrate (LOPRESSOR) 50 MG tablet Take 1 tablet (50 mg total) by mouth 2 (two) times daily. 01/18/18   Love, Ivan Anchors, PA-C  mouth rinse LIQD solution 15 mLs by Mouth Rinse route 2 (two) times daily.  01/18/18   Love, Ivan Anchors, PA-C  Multiple Vitamin (MULTIVITAMIN WITH MINERALS) TABS tablet Take 1 tablet by mouth daily. 01/19/18   Love, Ivan Anchors, PA-C  nitroGLYCERIN (NITROSTAT) 0.4 MG SL tablet Place 1 tablet (0.4 mg total) under the tongue every 5 (five) minutes as needed for chest pain (3 doses only). 01/18/18   Love, Ivan Anchors, PA-C  omeprazole (PRILOSEC) 20 MG capsule Take 20 mg by mouth daily.    [provider]  polyethylene glycol (MIRALAX / GLYCOLAX) packet Take  17 g by mouth daily. 01/19/18   Love, Ivan Anchors, PA-C  potassium chloride SA (K-DUR,KLOR-CON) 20 MEQ tablet Take 1 tablet by mouth daily. 11/10/16   [provider]  ranitidine (ZANTAC) 150 MG/10ML syrup Take 10 mLs (150 mg total) by mouth 2 (two) times daily. 01/18/18   Love, Ivan Anchors, PA-C  simethicone (MYLICON) 80 MG chewable tablet Chew 1 tablet (80 mg total) by mouth 4 (four) times daily as needed for flatulence. 01/18/18   Love, Ivan Anchors, PA-C  tamsulosin (FLOMAX) 0.4 MG CAPS capsule Take 1 capsule (0.4 mg total) by mouth daily after supper. 01/18/18   Love, Ivan Anchors, PA-C  vitamin B-12 (CYANOCOBALAMIN) 1000 MCG tablet Take 1,000 mcg by mouth daily.    [provider]  vitamin C (ASCORBIC ACID) 500 MG tablet Take 500 mg by mouth daily.    [provider]  vitamin E 400 UNIT capsule Take 400 Units by mouth daily.    [provider]      PHYSICAL EXAMINATION:   VITAL SIGNS: Blood pressure (!) 107/48, pulse 79, resp. rate 16, height _0  (1.727 m), weight 77.1 kg, SpO2 98 %.  GENERAL:  85 y.o.-year-old patient lying in the bed with no acute distress.  EYES: Pupils equal, round, reactive to light and accommodation. No scleral icterus. Extraocular muscles intact.  HEENT: Head atraumatic, normocephalic. Oropharynx and nasopharynx clear.  NECK:  Supple, no jugular venous distention. No thyroid enlargement, no tenderness.  LUNGS: Normal breath sounds bilaterally, no wheezing, rales,rhonchi  or crepitation. No use of accessory muscles of respiration.  CARDIOVASCULAR: S1, S2 normal. No murmurs, rubs, or gallops.  ABDOMEN: Soft, nontender, nondistended. Bowel sounds present. No organomegaly or mass.  EXTREMITIES: No pedal edema, cyanosis, or clubbing.  NEUROLOGIC: Awake, alert and oriented to time , place and person Decreased strength left upper and lower extremity from prior intracranial hemorrhage Sensation intact. Gait not checked.  PSYCHIATRIC: The patient is alert and oriented x 3.  SKIN: No obvious rash, lesion, or ulcer.   LABORATORY PANEL:   CBC Recent Labs  Lab 02/17/18 1251  WBC 9.3  HGB 11.4*  HCT 35.1*  PLT 275  MCV 92.4  MCH 30.0  MCHC 32.5  RDW 15.3   ------------------------------------------------------------------------------------------------------------------  Chemistries  Recent Labs  Lab 02/17/18 1251  NA 137  K 3.6  CL 103  CO2 27  GLUCOSE 155*  BUN 23  CREATININE 0.78  CALCIUM 8.8*   ------------------------------------------------------------------------------------------------------------------ estimated creatinine clearance is 52.1 mL/min (by C-G formula based on SCr of 0.78 mg/dL). ------------------------------------------------------------------------------------------------------------------ No results for input(s): TSH, T4TOTAL, T3FREE, THYROIDAB in the last 72 hours.  Invalid input(s): FREET3   Coagulation profile No results for input(s): INR, PROTIME in the last 168 hours. ------------------------------------------------------------------------------------------------------------------- No results for input(s): DDIMER in the last 72 hours. -------------------------------------------------------------------------------------------------------------------  Cardiac Enzymes Recent Labs  Lab 02/17/18 1251  TROPONINI 0.03*    ------------------------------------------------------------------------------------------------------------------ Invalid input(s): POCBNP  ---------------------------------------------------------------------------------------------------------------  Urinalysis    Component Value Date/Time   COLORURINE AMBER (A) 02/17/2018 1427   APPEARANCEUR CLOUDY (A) 02/17/2018 1427   LABSPEC 1.025 02/17/2018 1427   PHURINE 5.0 02/17/2018 1427   GLUCOSEU NEGATIVE 02/17/2018 1427   HGBUR NEGATIVE 02/17/2018 1427   BILIRUBINUR NEGATIVE 02/17/2018 1427   KETONESUR NEGATIVE 02/17/2018 1427   PROTEINUR 30 (A) 02/17/2018 1427   UROBILINOGEN 0.2 08/17/2012 1829   NITRITE POSITIVE (A) 02/17/2018 1427   LEUKOCYTESUR MODERATE (A) 02/17/2018 1427     RADIOLOGY: Ct Head Wo Contrast  Result  Date: 02/17/2018 CLINICAL DATA:  Syncope with right-sided weakness EXAM: CT HEAD WITHOUT CONTRAST TECHNIQUE: Contiguous axial images were obtained from the base of the skull through the vertex without intravenous contrast. COMPARISON:  CT brain 01/11/2018, 12/29/2017, 07/29/2016 FINDINGS: Brain: No acute territorial infarction or new intracranial hemorrhage. Further resolution of right temporal lobe hemorrhage and hemorrhagic material within the right lateral ventricle which has become more hypodense. Encephalomalacia is developing in the right temporal lobe. Moderate-to-marked atrophy. Moderate to marked small vessel ischemic changes of the white matter. Decreased midline shift as compared with prior CT. Vascular: No hyperdense vessel. Vertebral and carotid vascular calcification Skull: No acute fracture Sinuses/Orbits: No acute finding. Other: None IMPRESSION: 1. No new or acute intracranial abnormality since 01/11/2018 CT. Right temporal lobe and right intraventricular hemorrhage show further resolution. Developing encephalomalacia in the right temporal lobe. 2. Atrophy with chronic small vessel ischemic changes of the  white matter. Electronically Signed   By: Donavan Foil M.D.   On: 02/17/2018 15:14    EKG: Orders placed or performed during the hospital encounter of 02/17/18  . ED EKG  . ED EKG    IMPRESSION AND PLAN:   85 yr old female patientwith a known history of intracranial hemorrhage, chronic atrial fibrillation, coronary artery disease with history of stent placement, hiatal hernia, GERD, hypertension, hyperlipidemia presented to ER for passing out  -Syncope Vaso vagal in etiology IV fluids Cycle troponins Telemetry monitoring  -Urinary tract infection History of pseudomonal and ecoli uti Start IV zosyn abx Follow-up cultures  -Confusion could be secondary to UTI  -Dehydration IV fluids  -History of intracranial hemorrhage Avoid blood thinner medications  -Chronic atrial fibrillation Control rate with medications  -DVT prophylaxis with sequential compression device to lower extremities  All the records are reviewed and case discussed with ED provider. Management plans discussed with the patient, family and they are in agreement.  CODE STATUS:DNR Code Status History    Date Active Date Inactive Code Status Order ID Comments User Context   12/23/2017 1655 01/18/2018 1726 DNR [id number]  Bary Leriche, PA-C Inpatient   12/23/2017 1655 12/23/2017 1655 DNR [id number]  Bary Leriche, PA-C Inpatient   12/18/2017 1800 12/23/2017 1620 DNR [id number]  Traci Sermon, PA-C ED   04/10/2015 2047 04/13/2015 1542 Full Code [id number]  Etta Quill, DO ED   10/12/2014 1806 10/13/2014 1632 Full Code [id number]  Samella Parr, NP Inpatient    Questions for Most Recent Historical Code Status (Order [id number])    Question Answer Comment   In the event of cardiac or respiratory ARREST Do not call a "code blue"    In the event of cardiac or respiratory ARREST Do not perform Intubation, CPR, defibrillation or ACLS    In the event of cardiac or respiratory ARREST Use medication by any route,  position, wound care, and other measures to relive pain and suffering. May use oxygen, suction and manual treatment of airway obstruction as needed for comfort.         Advance Directive Documentation     Most Recent Value  Type of Advance Directive  Out of facility DNR (pink MOST or yellow form)  Pre-existing out of facility DNR order (yellow form or pink MOST form)  Pink MOST form placed in chart (order not valid for inpatient use)  "MOST" Form in Place?  -       TOTAL TIME TAKING CARE OF THIS PATIENT: 56 minutes.    Saundra Shelling M.D on  02/17/2018 at 3:47 PM  Between 7am to 6pm - Pager - [phone]  After 6pm go to [email or web address] - password [email or web address] [facility]  [facility]  Office  [phone]  CC: Primary care physician; Merrilee Seashore, MD

## 2018-02-17 NOTE — Progress Notes (Signed)
Unable to complete orthostatic vitals.  Patient is not able to stand, or sit up on her own.

## 2018-02-17 NOTE — ED Provider Notes (Signed)
[facility] Emergency Department Provider Note   ____________________________________________   First MD Initiated Contact with Patient 02/17/18 1333     (approximate)  I have reviewed the triage vital signs and the nursing notes.   HISTORY  Chief Complaint Loss of Consciousness  EM caveat: Some limitations patient poor historian.  History primarily provided by EMS report and also the patient's daughter who is at the bedside  HPI [name] is a 85 y.o. female reports that she got up this morning, and she remembers getting up to go use the bathroom.  She does not really remember the details well after that.  A friend or niece who is with her saw a event where she got up to the bathroom and then passed out briefly.  She did not fall and was held up by staff attending at the bedside.  She passed out for "a while".  She did come to prior to EMS arrival.  She was not complaining of anything, but they did note yesterday also when she got up for therapy that she felt very lightheaded and had to sit down with sort of similar symptoms but did not pass out.  They thought she looked a little "pale" yesterday  Patient denies any pain except for some chronic discomfort in her upper neck.  No headache.  No nausea or vomiting.  No chest pain or shortness of breath.  She does feel very tired and just reports she has not "felt well" for a while.  No fevers or chills.  No new weakness, chronically weak on the left arm.  Left leg.  Is not able to walk on her own since having a recent intracranial hemorrhage.   Past Medical History:  Diagnosis Date  . Ankle fracture   . Anxiety   . Arthritis   . Barrett's esophagus   . Chronic atrial fibrillation    a. Pt previously declined DCCV.  Marland Kitchen Coronary artery disease    a. s/p Cypher DES to LAD in 2006;  b. Last LHC 02/2008:  pLAD 30%, mLAD stent patent, pOM 50%, EF 65%;  c. Lexiscan Myoview 7/14:  Intermediate risk, dist ant and  inf-lat ischemia, EF 66% - patient declined cath and elected medical therapy.  Marland Kitchen GERD (gastroesophageal reflux disease)   . Hiatal hernia   . Hx of echocardiogram    Echocardiogram 08/17/12: EF 55-60%, MAC, mild MR, mild LAE, mild RVE, mild to moderate RAE, moderate TR, mildly increased pulmonary artery systolic pressures  . Hyperlipidemia   . Hypertension   . Mitral regurgitation    a. Mod by echo 10/2014.  . Obesity   . Pulmonary hypertension ([place])    a. Mod-severely elevated PA pressures by echo 10/2014.  Marland Kitchen QT prolongation    a. During 10/2014 admit, QTC 576m.  . Tricuspid regurgitation    a. Mod by echo 10/2014.  .Marland KitchenUTI (lower urinary tract infection) 03/2015    Patient Active Problem List   Diagnosis Date Noted  . Recurrent UTI--E coli/Pseudomonas 01/18/2018  . Orthostasis 01/18/2018  . Benign essential HTN   . Cerebral edema (HCC)   . Hypomagnesemia   . Dysphagia, post-stroke   . Lethargy   . Acute blood loss anemia   . Hypoalbuminemia due to protein-calorie malnutrition ([place]   . History of Barrett's esophagus   . Urinary retention   . Seizure prophylaxis   . E. coli UTI   . [place]ventricular hemorrhage ([place]   . Urinary tract  infection without hematuria   . Coronary artery disease involving native coronary artery of native heart without angina pectoris   . Pulmonary HTN ([place])   . Leukocytosis   . Intracranial hemorrhage ([place]) 12/18/2017  . Syncope 04/10/2015  . Sepsis secondary to UTI ([place]) 04/10/2015  . URI (upper respiratory infection) 04/10/2015  . UTI (lower urinary tract infection) 04/10/2015  . QT prolongation 10/12/2014  . Headache 09/02/2012  . Tricuspid valve regurgitation, moderate 08/18/2012  . Hypokalemia 08/16/2012  . Hyponatremia 08/16/2012  . Atrial fibrillation ([place])   . Hypertension   . Anxiety   . Mixed hyperlipidemia 09/11/2009  . GERD 08/30/2008  . CHEST PAIN 08/30/2008  . Coronary atherosclerosis 04/04/2008  . Syncope and collapse  04/04/2008    Past Surgical History:  Procedure Laterality Date  . ABDOMINAL HYSTERECTOMY    . ANKLE SURGERY  2001  . BREAST EXCISIONAL BIOPSY Right 1970  . BREAST EXCISIONAL BIOPSY Right   . BREAST EXCISIONAL BIOPSY Left   . CESAREAN SECTION    . CORONARY ANGIOPLASTY    . ESOPHAGOGASTRODUODENOSCOPY      Prior to Admission medications   Medication Sig Start Date End Date Taking? Authorizing Provider  acetaminophen (TYLENOL) 325 MG tablet Take 2 tablets (650 mg total) by mouth every 6 (six) hours. 01/18/18   Love, Ivan Anchors, PA-C  calcium carbonate (OS-CAL) 600 MG TABS Take 600 mg by mouth daily with breakfast.     [provider]  carboxymethylcellulose (REFRESH PLUS) 0.5 % SOLN Place 1 drop into both eyes as needed (dry eyes).    [provider]  Cholecalciferol (VITAMIN D-3) 1000 UNITS CAPS Take 1 capsule by mouth daily.    [provider]  docusate sodium (COLACE) 100 MG capsule Take 1 capsule (100 mg total) by mouth 2 (two) times daily. 01/18/18   Love, Ivan Anchors, PA-C  irbesartan (AVAPRO) 150 MG tablet Take 1 tablet (150 mg total) by mouth daily. 01/19/18   Love, Ivan Anchors, PA-C  levETIRAcetam (KEPPRA) 500 MG tablet Take 1 tablet (500 mg total) by mouth 2 (two) times daily. 12/23/17   Costella, Vista Mink, PA-C  magnesium oxide (MAG-OX) 400 (241.3 Mg) MG tablet Take 0.5 tablets (200 mg total) by mouth 2 (two) times daily. 01/18/18   Love, Ivan Anchors, PA-C  Menthol-Methyl Salicylate (MUSCLE RUB) 10-15 % CREA Apply 1 application topically every 6 (six) hours. To bilateral knees 01/18/18   Love, Ivan Anchors, PA-C  metoprolol tartrate (LOPRESSOR) 50 MG tablet Take 1 tablet (50 mg total) by mouth 2 (two) times daily. 01/18/18   Love, Ivan Anchors, PA-C  mouth rinse LIQD solution 15 mLs by Mouth Rinse route 2 (two) times daily. 01/18/18   Love, Ivan Anchors, PA-C  Multiple Vitamin (MULTIVITAMIN WITH MINERALS) TABS tablet Take 1 tablet by mouth daily. 01/19/18   Love, Ivan Anchors, PA-C    nitroGLYCERIN (NITROSTAT) 0.4 MG SL tablet Place 1 tablet (0.4 mg total) under the tongue every 5 (five) minutes as needed for chest pain (3 doses only). 01/18/18   Love, Ivan Anchors, PA-C  omeprazole (PRILOSEC) 20 MG capsule Take 20 mg by mouth daily.    [provider]  polyethylene glycol (MIRALAX / GLYCOLAX) packet Take 17 g by mouth daily. 01/19/18   Love, Ivan Anchors, PA-C  potassium chloride SA (K-DUR,KLOR-CON) 20 MEQ tablet Take 1 tablet by mouth daily. 11/10/16   [provider]  ranitidine (ZANTAC) 150 MG/10ML syrup Take 10 mLs (150 mg total) by mouth 2 (  two) times daily. 01/18/18   Love, Ivan Anchors, PA-C  simethicone (MYLICON) 80 MG chewable tablet Chew 1 tablet (80 mg total) by mouth 4 (four) times daily as needed for flatulence. 01/18/18   Love, Ivan Anchors, PA-C  tamsulosin (FLOMAX) 0.4 MG CAPS capsule Take 1 capsule (0.4 mg total) by mouth daily after supper. 01/18/18   Love, Ivan Anchors, PA-C  vitamin B-12 (CYANOCOBALAMIN) 1000 MCG tablet Take 1,000 mcg by mouth daily.    [provider]  vitamin C (ASCORBIC ACID) 500 MG tablet Take 500 mg by mouth daily.    [provider]  vitamin E 400 UNIT capsule Take 400 Units by mouth daily.    [provider]    Allergies Amlodipine; Methyldopa; Actonel [risedronate sodium]; Ciprofloxacin hcl; Hctz [hydrochlorothiazide]; Hydralazine hcl; Lisinopril; Milk-related compounds; Oxycodone hcl; Spironolactone; Tape; and Valsartan  Family History  Problem Relation Age of Onset  . Diabetes Mother   . Heart attack Mother   . Lung cancer Father   . Thyroid disease Sister   . Thyroid disease Sister   . Hypertension Sister   . Hypertension Sister   . Hypertension Sister   . Hypertension Sister   . Hypertension Brother   . Hypertension Brother   . Breast cancer Other 45  . Colon cancer Neg Hx   . Colon polyps Neg Hx   . Esophageal cancer Neg Hx   . Stroke Neg Hx     Social History Social History   Tobacco Use   . Smoking status: Former Smoker    Types: Cigarettes  . Smokeless tobacco: Never Used  Substance Use Topics  . Alcohol use: No    Alcohol/week: 0.0 standard drinks  . Drug use: No    Review of Systems Constitutional: No fever/chills Eyes: No visual changes. ENT: No sore throat. Cardiovascular: Denies chest pain. Respiratory: Denies shortness of breath. Gastrointestinal: No abdominal pain.   Genitourinary: Negative for dysuria.  Does have a history of recent urinary tract infection around the time of her "head bleed" Musculoskeletal: Negative for back pain. Skin: Negative for rash. Neurological: Negative for headaches, areas of focal weakness or numbness except on left arm and left leg which is chronic after intracranial hemorrhage.    ____________________________________________   PHYSICAL EXAM:  VITAL SIGNS: ED Triage Vitals  Enc Vitals Group     BP 02/17/18 1257 (!) 107/48     Pulse Rate 02/17/18 1257 79     Resp 02/17/18 1257 16     Temp --      Temp src --      SpO2 02/17/18 1257 98 %     Weight 02/17/18 1251 170 lb (77.1 kg)     Height 02/17/18 1251 _0  (1.727 m)     Head Circumference --      Peak Flow --      Pain Score 02/17/18 1251 0     Pain Loc --      Pain Edu? --      Excl. in [place]? --     Constitutional: Alert and oriented.  She is chronically ill-appearing.  In no acute distress.  Slightly somnolent which family reports is been off and on for about a month Eyes: Conjunctivae are normal. Head: Atraumatic. Nose: No congestion/rhinnorhea. Mouth/Throat: Mucous membranes are very dry. Neck: No stridor.  Cardiovascular: Normal rate, regular rhythm. Grossly normal heart sounds.  Good peripheral circulation. Respiratory: Normal respiratory effort.  No retractions. Lungs CTAB. Gastrointestinal: Soft and nontender. No  distention. Musculoskeletal: No lower extremity tenderness nor edema. Neurologic:  Normal speech and language. No gross focal neurologic  deficits are appreciated except for about 4+ strength in the left arm and left leg which is chronic.  The right side extremities are 5+ but generally fatigued in appearance.  No focal deficits except for slight weakness notable of the left side which is ongoing since intracranial hemorrhage.  Skin:  Skin is warm, dry and intact. No rash noted. Psychiatric: Mood and affect are slightly flat. Speech and behavior are normal.  ____________________________________________   LABS (all labs ordered are listed, but only abnormal results are displayed)  Labs Reviewed  CBC - Abnormal; Notable for the following components:      Result Value   RBC 3.80 (*)    Hemoglobin 11.4 (*)    HCT 35.1 (*)    All other components within normal limits  BASIC METABOLIC PANEL - Abnormal; Notable for the following components:   Glucose, Bld 155 (*)    Calcium 8.8 (*)    All other components within normal limits  TROPONIN I - Abnormal; Notable for the following components:   Troponin I 0.03 (*)    All other components within normal limits  URINALYSIS, COMPLETE (UACMP) WITH MICROSCOPIC - Abnormal; Notable for the following components:   Color, Urine AMBER (*)    APPearance CLOUDY (*)    Protein, ur 30 (*)    Nitrite POSITIVE (*)    Leukocytes, UA MODERATE (*)    WBC, UA >50 (*)    Bacteria, UA MANY (*)    All other components within normal limits  BRAIN NATRIURETIC PEPTIDE - Abnormal; Notable for the following components:   B Natriuretic Peptide 324.0 (*)    All other components within normal limits  URINE CULTURE   ____________________________________________  EKG  Reviewed and entered by me at 1430 Heart rate 80 QRS 100 QTc 410 Apparent atrial fibrillation, no evidence of acute ischemia. ____________________________________________  RADIOLOGY   CT reviewed negative for acute change ____________________________________________   PROCEDURES  Procedure(s) performed:  None  Procedures  Critical Care performed: No  ____________________________________________   INITIAL IMPRESSION / ASSESSMENT AND PLAN / ED COURSE  Pertinent labs & imaging results that were available during my care of the patient were reviewed by me and considered in my medical decision making (see chart for details).   Patient presents for evaluation of a syncopal episode.  Recent intracranial hemorrhage, will obtain a CT of the head to assure no advancement or surrounding vasogenic edema though her clinical symptoms seem to suggest unlikely to be an acute neurologic process today as her examination is at baseline.  She is slightly somnolent, family reports that she is been fatigued intermittently somnolent for about a month now at rehab.  Syncope abnormal though, had a near syncopal episode yesterday as well.  Appears slightly dehydrated, will check labs and initiate fluid bolus here as we continue evaluation.  Differential diagnosis broad and will evaluate with labs, ua, ct, ekg, etc.     ----------------------------------------- 3:25 PM on 02/17/2018 -----------------------------------------  Reviewed prior cultures.  Appears appropriate antibiotic to initiate would be Rocephin at this time.  Will admit for concerns of syncope, likely some element of dehydration based on examination also urinary tract infection.  Patient and family agreeable with the plan.  ____________________________________________   FINAL CLINICAL IMPRESSION(S) / ED DIAGNOSES  Final diagnoses:  Urinary tract infection, acute  Syncope and collapse  Note:  This document was prepared using Systems analyst and may include unintentional dictation errors       Delman Kitten, MD 02/17/18 1535

## 2018-02-17 NOTE — ED Triage Notes (Signed)
Pt from [facility] via [facility] for episode of syncope while pt was using the toilet. Pt did not hit head or fall, staff was with her. Baseline dementia. NAD

## 2018-02-17 NOTE — ED Notes (Signed)
Dr Corky Downs made awware of troponin

## 2018-02-18 DIAGNOSIS — N3000 Acute cystitis without hematuria: Secondary | ICD-10-CM

## 2018-02-18 DIAGNOSIS — R531 Weakness: Secondary | ICD-10-CM

## 2018-02-18 DIAGNOSIS — N390 Urinary tract infection, site not specified: Principal | ICD-10-CM

## 2018-02-18 DIAGNOSIS — I25118 Atherosclerotic heart disease of native coronary artery with other forms of angina pectoris: Secondary | ICD-10-CM

## 2018-02-18 DIAGNOSIS — I4821 Permanent atrial fibrillation: Secondary | ICD-10-CM

## 2018-02-18 DIAGNOSIS — R55 Syncope and collapse: Secondary | ICD-10-CM

## 2018-02-18 LAB — CBC
HCT: 30.9 % — ABNORMAL LOW (ref 36.0–46.0)
Hemoglobin: 10.2 g/dL — ABNORMAL LOW (ref 12.0–15.0)
MCH: 30 pg (ref 26.0–34.0)
MCHC: 33 g/dL (ref 30.0–36.0)
MCV: 90.9 fL (ref 80.0–100.0)
Platelets: 262 10*3/uL (ref 150–400)
RBC: 3.4 MIL/uL — ABNORMAL LOW (ref 3.87–5.11)
RDW: 15.4 % (ref 11.5–15.5)
WBC: 9.4 10*3/uL (ref 4.0–10.5)
nRBC: 0 % (ref 0.0–0.2)

## 2018-02-18 LAB — BASIC METABOLIC PANEL
ANION GAP: 6 (ref 5–15)
BUN: 19 mg/dL (ref 8–23)
CO2: 25 mmol/L (ref 22–32)
Calcium: 8.5 mg/dL — ABNORMAL LOW (ref 8.9–10.3)
Chloride: 108 mmol/L (ref 98–111)
Creatinine, Ser: 0.51 mg/dL (ref 0.44–1.00)
GFR calc Af Amer: >60 mL/min (ref >60)
GFR calc non Af Amer: >60 mL/min (ref >60)
GLUCOSE: 96 mg/dL (ref 70–99)
Potassium: 3.7 mmol/L (ref 3.5–5.1)
Sodium: 139 mmol/L (ref 135–145)

## 2018-02-18 LAB — TROPONIN I
Troponin I: 0.03 ng/mL (ref <0.03)
Troponin I: 0.08 ng/mL (ref <0.03)
Troponin I: 0.03 ng/mL (ref <0.03)

## 2018-02-18 MED ORDER — OCUVITE-LUTEIN PO CAPS
1.0000 | ORAL_CAPSULE | Freq: Every day | ORAL | Status: DC
  Administered 2018-02-18 – 2018-02-20 (×3): 1 via ORAL
  Filled 2018-02-18 (×5): qty 1

## 2018-02-18 MED ORDER — PRO-STAT SUGAR FREE PO LIQD
30.0000 mL | Freq: Two times a day (BID) | ORAL | Status: DC
  Administered 2018-02-18 – 2018-02-20 (×3): 30 mL via ORAL

## 2018-02-18 MED ORDER — ENSURE ENLIVE PO LIQD
237.0000 mL | Freq: Two times a day (BID) | ORAL | Status: DC
  Administered 2018-02-18 – 2018-02-20 (×4): 237 mL via ORAL

## 2018-02-18 MED ORDER — ASPIRIN 325 MG PO TABS
325.0000 mg | ORAL_TABLET | Freq: Every day | ORAL | Status: DC
  Filled 2018-02-18: qty 1

## 2018-02-18 NOTE — Plan of Care (Signed)
  Problem: Education: Goal: Knowledge of General Education information will improve Description Including pain rating scale, medication(s)/side effects and non-pharmacologic comfort measures Outcome: Not Progressing Note:  Patient with baseline dementia. Family present earlier in shift, requested multiple dietary / nutritional changes. Patient with UTI receiving IV antibiotics. Will continue to monitor. Wenda Low [facility]

## 2018-02-18 NOTE — Progress Notes (Signed)
PT Cancellation Note  Patient Details Name: [name] MRN: [id number] DOB: 1926/02/22   Cancelled Treatment:    Reason Eval/Treat Not Completed: Other (comment).  Attempted to awaken and was very lethargic, unable to keep eyes open.  Try again at another time.   Ramond Dial 02/18/2018, 8:14 AM   Mee Hives, PT MS Acute Rehab Dept. Number: [place] and [place]

## 2018-02-18 NOTE — Evaluation (Signed)
Physical Therapy Evaluation Patient Details Name: [name] MRN: [id number] DOB: 05-Apr-1926 Today's Date: 02/18/2018   History of Present Illness  85 y.o. female was referred to PT for mobility, noted she was in bed most of the bedtime at SNF.  Pt has elevated troponin, encephalomalacia, but cleared for head on CT.  R hemiparesis presenting now.  PMHx:  A-fib, CAD, HTN, ICH, ankle fracture, stents, UTI  Clinical Impression  Pt is progressed to sitting side of bed with family present to observe.  Pt is able to help sit up, BP was not hypotensive with 287 systolic, able to sit with less dizziness as she progressed.  Follow acutely to work on balance and control of standing as she becomes stronger.  Pt is motivated but just not feeling very strong.  Will focus on balance and strength acutely to increase her speed of completion of therapy in SNF.      Follow Up Recommendations SNF    Equipment Recommendations  None recommended by PT    Recommendations for Other Services       Precautions / Restrictions Precautions Precautions: Fall Restrictions Weight Bearing Restrictions: No      Mobility  Bed Mobility Overal bed mobility: Needs Assistance Bed Mobility: Supine to Sit;Sit to Supine     Supine to sit: Mod assist Sit to supine: Mod assist      Transfers Overall transfer level: Needs assistance               General transfer comment: pt was light headed and so could not stand  Ambulation/Gait             General Gait Details: not able  Stairs            Wheelchair Mobility    Modified Rankin (Stroke Patients Only)       Balance Overall balance assessment: Needs assistance Sitting-balance support: Feet supported;Bilateral upper extremity supported Sitting balance-Leahy Scale: Poor                                       Pertinent Vitals/Pain Pain Assessment: No/denies pain    Home Living Family/patient expects to be  discharged to:: Skilled nursing facility                      Prior Function Level of [place]: Needs assistance   Gait / Transfers Assistance Needed: was in SNF recently and just recently stood with 3 person assist  ADL's / Homemaking Assistance Needed: living in SNF        Hand Dominance   Dominant Hand: Right    Extremity/Trunk Assessment   Upper Extremity Assessment Upper Extremity Assessment: Generalized weakness    Lower Extremity Assessment Lower Extremity Assessment: Generalized weakness    Cervical / Trunk Assessment Cervical / Trunk Assessment: Kyphotic  Communication   Communication: HOH  Cognition Arousal/Alertness: Awake/alert Behavior During Therapy: Flat affect Overall Cognitive Status: Impaired/Different from baseline Area of Impairment: Following commands;Safety/judgement;Awareness;Problem solving                       Following Commands: Follows one step commands with increased time Safety/Judgement: Decreased awareness of deficits;Decreased awareness of safety Awareness: Intellectual Problem Solving: Slow processing;Decreased initiation;Difficulty sequencing;Requires verbal cues;Requires tactile cues        General Comments General comments (skin integrity, edema, etc.): pt could be supervised and  then assisted to remain sitting bedside    Exercises     Assessment/Plan    PT Assessment Patient needs continued PT services  PT Problem List Decreased strength;Decreased range of motion;Decreased activity tolerance;Decreased balance;Decreased mobility;Decreased coordination;Decreased knowledge of use of DME;Decreased safety awareness;Decreased knowledge of precautions;Cardiopulmonary status limiting activity       PT Treatment Interventions DME instruction;Gait training;Functional mobility training;Therapeutic activities;Therapeutic exercise;Balance training;Neuromuscular re-education;Patient/family education    PT Goals  (Current goals can be found in the Care Plan section)  Acute Rehab PT Goals Patient Stated Goal: to sit up side of bed PT Goal Formulation: With patient/family Time For Goal Achievement: 03/04/18 Potential to Achieve Goals: Good    Frequency Min 2X/week   Barriers to discharge Other (comment)(in SNF care right now)      Co-evaluation               AM-PAC PT "6 Clicks" Mobility  Outcome Measure Help needed turning from your back to your side while in a flat bed without using bedrails?: A Lot Help needed moving from lying on your back to sitting on the side of a flat bed without using bedrails?: A Lot Help needed moving to and from a bed to a chair (including a wheelchair)?: A Lot Help needed standing up from a chair using your arms (e.g., wheelchair or bedside chair)?: Total Help needed to walk in hospital room?: Total Help needed climbing 3-5 steps with a railing? : Total 6 Click Score: 9    End of Session Equipment Utilized During Treatment: Gait belt Activity Tolerance: Patient limited by fatigue Patient left: in bed;with call bell/phone within reach;with bed alarm set;with family/visitor present;with nursing/sitter in room Nurse Communication: Mobility status PT Visit Diagnosis: Unsteadiness on feet (R26.81);Other abnormalities of gait and mobility (R26.89);Muscle weakness (generalized) (M62.81);Difficulty in walking, not elsewhere classified (R26.2);Dizziness and giddiness (R42)    Time: 9937-1696 PT Time Calculation (min) (ACUTE ONLY): 23 min   Charges:   PT Evaluation $PT Eval Moderate Complexity: 1 Mod PT Treatments $Therapeutic Activity: 8-22 mins        Ramond Dial 02/18/2018, 7:38 PM   Mee Hives, PT MS Acute Rehab Dept. Number: [place] and [place]

## 2018-02-18 NOTE — Progress Notes (Signed)
[place] [facility] [place] NAME: [name]    MR#:  [id number]  DATE OF BIRTH:  31-Jan-1927  SUBJECTIVE:  CHIEF COMPLAINT:   Chief Complaint  Patient presents with  . Loss of Consciousness   Patient is demented and confused. REVIEW OF SYSTEMS:  Review of Systems  Unable to perform ROS: Dementia    DRUG ALLERGIES:   Allergies  Allergen Reactions  . Amlodipine Shortness Of Breath and Swelling    Edema in legs  . Methyldopa Shortness Of Breath and Swelling  . Actonel [Risedronate Sodium] Other (See Comments)    Caused a lump to come up in throat  . Ciprofloxacin Hcl     hallucinations  . Hctz [Hydrochlorothiazide] Itching    Pt reports causing "itching under the skin, with no rash noted."  . Hydralazine Hcl     Unknown, per pt   . Lactose Intolerance (Gi)   . Lisinopril Cough  . Milk-Related Compounds     diarrhea  . Oxycodone Hcl     hallucinations  . Spironolactone     Itchiing  . Tape Itching  . Valsartan     Unknown, per pt   VITALS:  Blood pressure (!) 164/88, pulse 80, temperature 98.1 F (36.7 C), temperature source Oral, resp. rate 18, height 5\' 8"  (1.727 m), weight 71.3 kg, SpO2 100 %. PHYSICAL EXAMINATION:  Physical Exam Constitutional:      General: She is not in acute distress. HENT:     Head: Normocephalic.     Mouth/Throat:     Mouth: Mucous membranes are moist.  Eyes:     General: No scleral icterus.    Conjunctiva/sclera: Conjunctivae normal.     Pupils: Pupils are equal, round, and reactive to light.  Neck:     Musculoskeletal: Normal range of motion and neck supple.     Vascular: No JVD.     Trachea: No tracheal deviation.  Cardiovascular:     Rate and Rhythm: Normal rate and regular rhythm.     Heart sounds: Normal heart sounds. No murmur. No gallop.   Pulmonary:     Effort: Pulmonary effort is normal. No respiratory distress.     Breath sounds: Normal breath sounds. No wheezing or  rales.  Abdominal:     General: Bowel sounds are normal. There is no distension.     Palpations: Abdomen is soft.     Tenderness: There is no abdominal tenderness. There is no rebound.  Musculoskeletal: Normal range of motion.        General: No tenderness.  Skin:    Findings: No erythema or rash.  Neurological:     Mental Status: She is alert.     Cranial Nerves: No cranial nerve deficit.  Psychiatric:     Comments: Demented and confused.    LABORATORY PANEL:  Female CBC Recent Labs  Lab 02/18/18 0508  WBC 9.4  HGB 10.2*  HCT 30.9*  PLT 262   ------------------------------------------------------------------------------------------------------------------ Chemistries  Recent Labs  Lab 02/18/18 0508  NA 139  K 3.7  CL 108  CO2 25  GLUCOSE 96  BUN 19  CREATININE 0.51  CALCIUM 8.5*   RADIOLOGY:  No results found. ASSESSMENT AND PLAN:   85 yr old female patientwith a known history of intracranial hemorrhage, chronic atrial fibrillation, coronary artery disease with history of stent placement, hiatal hernia, GERD, hypertension, hyperlipidemia presented to ER for passing out  -Syncope Vaso vagal in etiology The patient  is treated with IV fluids  -Urinary tract infection History of pseudomonal and ecoli uti Continue IV zosyn abx Follow-up cultures  -Confusion could be secondary to UTI and dementia.  -Dehydration Improved with IV fluids  -History of intracranial hemorrhage Avoid blood thinner medications  -Chronic atrial fibrillation Continue Lopressor, not a candidate for anticoagulation.  Hy0pertension.  Continue home hypertension medication.  I discussed with Dr. Rockey Situ. All the records are reviewed and case discussed with Care Management/Social Worker. Management plans discussed with the patient, family and they are in agreement.  CODE STATUS: DNR  TOTAL TIME TAKING CARE OF THIS PATIENT: 30 minutes.   More than 50% of the time was spent in  counseling/coordination of care: YES  POSSIBLE D/C IN 1-2 DAYS, DEPENDING ON CLINICAL CONDITION.   Demetrios Loll M.D on 02/18/2018 at 4:37 PM  Between 7am to 6pm - Pager - [phone]  After 6pm go to [email or web address] - [facility]

## 2018-02-18 NOTE — Progress Notes (Addendum)
Initial Nutrition Assessment  DOCUMENTATION CODES:   Not applicable  INTERVENTION:   Ensure Enlive po BID, each supplement provides 350 kcal and 20 grams of protein  MVI daily   Ocuvite daily for wound healing (provides zinc, vitamin A, vitamin C, Vitamin E, copper, and selenium)  Prostat liquid protein PO 30 ml BID, each supplement provides 100 kcal, 15 grams protein.  Dysphagia 3 diet with ground meats  Assist with meals  Soy milk with all breakfast trays  Soups with lunch and dinner trays   NUTRITION DIAGNOSIS:   Inadequate oral intake related to acute illness as evidenced by meal completion < 25%.  GOAL:   Patient will meet greater than or equal to 90% of their needs  MONITOR:   PO intake, Supplement acceptance, Labs, Weight trends, Skin, I & O's  REASON FOR ASSESSMENT:   Malnutrition Screening Tool    ASSESSMENT:    85 yr old female patient with a known history of dementia, intracranial hemorrhage, chronic atrial fibrillation, barretts esophagus, coronary artery disease with history of stent placement, hiatal hernia, GERD, hypertension, hyperlipidemia presented to ER for syncope Pt mostly bed bound at baseline   Visited pt's room today; spoke with pt's sister at bedside. Pt's sister upset today because when she entered room pt was eating alone with no assistance. Sister unable to provide much history as she is upset and keeps repeating "can't you guys see her records". Sister requesting that pt have assistance for the entire time that she is eating. Explained to sister that we can only see records from [facility] but are unable to see any records from [facility]. Per chart review from Dietitian notes at [facility] from 11/13-11/26, pt with fairly good appetite and oral intake while there. Pt documented to be eating 50-100% of meals. Pt also documented to be drinking 2 prostat per day, 2-3 Boost Breeze per day, drinking soy milk with breakfast every day and eating soups  with all of her lunch and dinner trays. Pt also enjoys oatmeal and often ate two bowels for breakfast. RD is unsure of pt's intake while at [facility]. Pt on dysphagia 2 diet at [facility]; requires ground to pureed meats. Pt on dysphagia 3 diet here; will request ground meats and assistance with all meals. Pt is lactose intolerant; will request soy milk on all breakfast trays and soups with lunch and dinner trays. RD will provide Ensure as this is lactose free and provides more protein and calories than Boost Breeze. If patient unwilling to drink Ensure, will change to [employer]. Will continue prostat as pt was drinking these at [facility]. Will add vitamins to support wound healing as pt's sister reports that pt has a sacral wound. Pt is edentulous and did not bring her dentures from [facility]. Per chart, pt with 15lb(9%) wt loss since May. RD is unsure how recent this weight loss occurred; this would be significant if lost within the past 3 months. RD will monitor pt's intake and supplement acceptance. Pt ate <25% of her breakfast this morning but did consume a few bites of egg.    Medications reviewed and include: aspirin, tums, vitamin D, colace, MVI, protonix, miralax, KCl, B12, vitamin C, vitamin E, NaCl @75ml /hr, cefepime   Labs reviewed: Hgb 10.2(L), Hct 30.9(L)  NUTRITION - FOCUSED PHYSICAL EXAM:    Most Recent Value  Orbital Region  Mild depletion  Upper Arm Region  Mild depletion  Thoracic and Lumbar Region  Mild depletion  Buccal Region  Moderate depletion  Temple Region  Moderate depletion  Clavicle Bone Region  Mild depletion  Clavicle and Acromion Bone Region  Mild depletion  Scapular Bone Region  Mild depletion  Dorsal Hand  Mild depletion  Patellar Region  Mild depletion  Anterior Thigh Region  No depletion  Posterior Calf Region  Mild depletion  Edema (RD Assessment)  Mild  Hair  Reviewed  Eyes  Reviewed  Mouth  Reviewed  Skin  Reviewed  Nails  Reviewed     Diet Order:    Diet Order            DIET DYS 3 Room service appropriate? No; Fluid consistency: Thin  Diet effective now             EDUCATION NEEDS:   No education needs have been identified at this time  Skin:  Skin Assessment: (sacral wound reported by family )  Last BM:  1/2  Height:   Ht Readings from Last 1 Encounters:  02/17/18 5\' 8"  (1.727 m)    Weight:   Wt Readings from Last 1 Encounters:  02/17/18 71.3 kg    Ideal Body Weight:  63.6 kg  BMI:  Body mass index is 23.9 kg/m.  Estimated Nutritional Needs:   Kcal:  1500-1700kcal/day   Protein:  80-92g/day   Fluid:  >1.5L/day   Koleen Distance MS, RD, LDN Pager #- [phone] Office#- [phone] After Hours Pager: [phone]

## 2018-02-18 NOTE — Consult Note (Signed)
Cardiology Consultation:   Patient ID: [name] MRN: [id number]; DOB: 08-13-1926  Admit date: 02/17/2018 Date of Consult: 02/18/2018  Primary Care Provider: Merrilee Seashore, MD Primary Cardiologist:Dr. [name] Primary Electrophysiologist:  None    Patient Profile:   [name] is a 85 y.o. female with a hx of chronic atrial fibrillation, CAD status post Cypher DES to LAD (2006), HTN, syncope, h/o ICH, HLD, GERD who is being seen today for the evaluation of elevated troponin at the request of Dr. Bridgett Larsson.  History of Present Illness:   [name] an 85 year old female with PMH as above. Last seen 02/04/2017 by [facility] for syncope with first episode reported in 2006 and subsequent stent placement as above.  In 08/2012, she had near syncope that was felt to be due to dehydration and orthostasis.  [place] 02/2008 showed proximal LAD 30%, mid LAD stent patent, proximal OM 50%.  Follow-up nuclear stress test 2014 was found to be abnormal.  At that time, she decided to forego cardiac catheterization and opted for medical management.  As above, she has a history of chronic atrial fibrillation and is on Xarelto for anticoagulation.  In the past, the patient has declined DCCV.  Due to her issues with syncope, her metoprolol was decreased during her 09/2014 admission to 12.5 mg twice daily.  10/2014, echo showed normal systolic function and mild LVH with estimated EF 55 to 60%, moderate MR/TR, bilateral atrial enlargement with mild LAE/RAE, severely increased pulmonary artery systolic pressure with PA peak pressure 56 mm hh.  Holter monitor 10/2014 showed patient was in atrial fibrillation with ventricular rate of 56-1 37 and mean of 88 bpm with longest R to R pause 2.34 seconds.  No symptoms at that time and no overall indication for pacer.  On 12/23/2017, she was seen at [facility] for [place] and anticoagulation and ASA stopped at that time.   On 02/17/2018, she reportedly had a near syncopal or syncopal event  when getting up to use the restroom but does not remember the details well surrounding it.  Report provided by a bystander.  She was reported as not hitting the ground as she was held up by staff attending at bedside.  Also noted was that the patient looked pale and felt very lightheaded when she presented for therapy on 02/16/2018.  At presentation to the ED, patient denied chest pain, palpitations, shortness of breath.  She reported she felt fatigued and "has not felt well for a while." She reportedly has both chronically weak left arm and leg and does not ambulate on her own since her [place]. PTA medications include irbesartan 150 mg p.o. q. 1800, Lopressor 50 mg Pope bid. Not on anticoagulation d/t ICH. Reported intolerances to ACE/ARB/hydralazine/aldactone.  In the ED 02/17/2018 Vitals: 107/48, RR 16, HR 79, SPO2 98% Labs: UA positive for UTI.  Sodium 137, potassium 3.6, glucose 155, creatinine 0.78, BUN 23, calcium 8.8, BNP 324.0, troponin 0 0.03, WBC 9.3, RBC 3.8, hemoglobin 11.4, hematocrit 35.1. EKG: Afib, HR 80, IVCD (unable to assess EKG via EMR) CXR: No acute changes Meds: Cefepime  Since admission, troponin 0.03  0.08  0.03. BP improved with IVF as hypotensive at admission to slightly hypertensive at the time of Cardiology consult. During today's visit, she was joined by both her sister and brother, both of whom provided most of the history as the patient was asleep during the exam and not reportedly the best historian.  Past Medical History:  Diagnosis Date  . Ankle fracture   .  Anxiety   . Arthritis   . Barrett's esophagus   . Chronic atrial fibrillation    a. Pt previously declined DCCV.  Marland Kitchen Coronary artery disease    a. s/p Cypher DES to LAD in 2006;  b. Last LHC 02/2008:  pLAD 30%, mLAD stent patent, pOM 50%, EF 65%;  c. Lexiscan Myoview 7/14:  Intermediate risk, dist ant and inf-lat ischemia, EF 66% - patient declined cath and elected medical therapy.  Marland Kitchen GERD (gastroesophageal reflux  disease)   . Hiatal hernia   . Hx of echocardiogram    Echocardiogram 08/17/12: EF 55-60%, MAC, mild MR, mild LAE, mild RVE, mild to moderate RAE, moderate TR, mildly increased pulmonary artery systolic pressures  . Hyperlipidemia   . Hypertension   . Mitral regurgitation    a. Mod by echo 10/2014.  . Obesity   . Pulmonary hypertension ([place])    a. Mod-severely elevated PA pressures by echo 10/2014.  Marland Kitchen QT prolongation    a. During 10/2014 admit, QTC 520m.  . Tricuspid regurgitation    a. Mod by echo 10/2014.  .Marland KitchenUTI (lower urinary tract infection) 03/2015    Past Surgical History:  Procedure Laterality Date  . ABDOMINAL HYSTERECTOMY    . ANKLE SURGERY  2001  . BREAST EXCISIONAL BIOPSY Right 1970  . BREAST EXCISIONAL BIOPSY Right   . BREAST EXCISIONAL BIOPSY Left   . CESAREAN SECTION    . CORONARY ANGIOPLASTY    . ESOPHAGOGASTRODUODENOSCOPY       Home Medications:  Prior to Admission medications   Medication Sig Start Date End Date Taking? Authorizing Provider  acetaminophen (TYLENOL) 325 MG tablet Take 2 tablets (650 mg total) by mouth every 6 (six) hours. 01/18/18   Love, PIvan Anchors PA-C  calcium carbonate (OS-CAL) 600 MG TABS Take 600 mg by mouth daily with breakfast.     [provider]  carboxymethylcellulose (REFRESH PLUS) 0.5 % SOLN Place 1 drop into both eyes as needed (dry eyes).    [provider]  Cholecalciferol (VITAMIN D-3) 1000 UNITS CAPS Take 1 capsule by mouth daily.    [provider]  docusate sodium (COLACE) 100 MG capsule Take 1 capsule (100 mg total) by mouth 2 (two) times daily. 01/18/18   Love, PIvan Anchors PA-C  irbesartan (AVAPRO) 150 MG tablet Take 1 tablet (150 mg total) by mouth daily. 01/19/18   Love, PIvan Anchors PA-C  levETIRAcetam (KEPPRA) 500 MG tablet Take 1 tablet (500 mg total) by mouth 2 (two) times daily. 12/23/17   Costella, VVista Mink PA-C  magnesium oxide (MAG-OX) 400 (241.3 Mg) MG tablet Take 0.5 tablets (200 mg total) by mouth  2 (two) times daily. 01/18/18   Love, PIvan Anchors PA-C  Menthol-Methyl Salicylate (MUSCLE RUB) 10-15 % CREA Apply 1 application topically every 6 (six) hours. To bilateral knees 01/18/18   Love, PIvan Anchors PA-C  metoprolol tartrate (LOPRESSOR) 50 MG tablet Take 1 tablet (50 mg total) by mouth 2 (two) times daily. 01/18/18   Love, PIvan Anchors PA-C  mouth rinse LIQD solution 15 mLs by Mouth Rinse route 2 (two) times daily. 01/18/18   Love, PIvan Anchors PA-C  Multiple Vitamin (MULTIVITAMIN WITH MINERALS) TABS tablet Take 1 tablet by mouth daily. 01/19/18   Love, PIvan Anchors PA-C  nitroGLYCERIN (NITROSTAT) 0.4 MG SL tablet Place 1 tablet (0.4 mg total) under the tongue every 5 (five) minutes as needed for chest pain (3 doses only). 01/18/18   Love, PIvan Anchors PA-C  omeprazole (  PRILOSEC) 20 MG capsule Take 20 mg by mouth daily.    [provider]  polyethylene glycol (MIRALAX / GLYCOLAX) packet Take 17 g by mouth daily. 01/19/18   Love, Ivan Anchors, PA-C  potassium chloride SA (K-DUR,KLOR-CON) 20 MEQ tablet Take 1 tablet by mouth daily. 11/10/16   [provider]  ranitidine (ZANTAC) 150 MG/10ML syrup Take 10 mLs (150 mg total) by mouth 2 (two) times daily. 01/18/18   Love, Ivan Anchors, PA-C  simethicone (MYLICON) 80 MG chewable tablet Chew 1 tablet (80 mg total) by mouth 4 (four) times daily as needed for flatulence. 01/18/18   Love, Ivan Anchors, PA-C  tamsulosin (FLOMAX) 0.4 MG CAPS capsule Take 1 capsule (0.4 mg total) by mouth daily after supper. 01/18/18   Love, Ivan Anchors, PA-C  vitamin B-12 (CYANOCOBALAMIN) 1000 MCG tablet Take 1,000 mcg by mouth daily.    [provider]  vitamin C (ASCORBIC ACID) 500 MG tablet Take 500 mg by mouth daily.    [provider]  vitamin E 400 UNIT capsule Take 400 Units by mouth daily.    [provider]    Inpatient Medications: Scheduled Meds: . aspirin  325 mg Oral Daily  . calcium carbonate  3 tablet Oral Q breakfast  . cholecalciferol  1,000  Units Oral Daily  . docusate sodium  100 mg Oral BID  . feeding supplement (ENSURE ENLIVE)  237 mL Oral BID BM  . feeding supplement (PRO-STAT SUGAR FREE 64)  30 mL Oral BID  . irbesartan  150 mg Oral Daily  . levETIRAcetam  500 mg Oral BID  . magnesium oxide  200 mg Oral BID  . mouth rinse  15 mL Mouth Rinse BID  . metoprolol tartrate  50 mg Oral BID  . multivitamin with minerals  1 tablet Oral Daily  . multivitamin-lutein  1 capsule Oral Daily  . pantoprazole  40 mg Oral Daily  . polyethylene glycol  17 g Oral Daily  . potassium chloride SA  20 mEq Oral Daily  . tamsulosin  0.4 mg Oral QPC supper  . vitamin B-12  1,000 mcg Oral Daily  . vitamin C  500 mg Oral Daily  . vitamin E  400 Units Oral Daily   Continuous Infusions: . sodium chloride 75 mL/hr at 02/18/18 0847  . ceFEPime (MAXIPIME) IV 1 g (02/18/18 0949)   PRN Meds: acetaminophen **OR** acetaminophen, nitroGLYCERIN, polyvinyl alcohol, simethicone  Allergies:    Allergies  Allergen Reactions  . Amlodipine Shortness Of Breath and Swelling    Edema in legs  . Methyldopa Shortness Of Breath and Swelling  . Actonel [Risedronate Sodium] Other (See Comments)    Caused a lump to come up in throat  . Ciprofloxacin Hcl     hallucinations  . Hctz [Hydrochlorothiazide] Itching    Pt reports causing "itching under the skin, with no rash noted."  . Hydralazine Hcl     Unknown, per pt   . Lactose Intolerance (Gi)   . Lisinopril Cough  . Milk-Related Compounds     diarrhea  . Oxycodone Hcl     hallucinations  . Spironolactone     Itchiing  . Tape Itching  . Valsartan     Unknown, per pt    Social History:   Social History   Socioeconomic History  . Marital status: Widowed    Spouse name: Not on file  . Number of children: 1  . Years of education: Not on file  . Highest  education level: Not on file  Occupational History  . Occupation: Retired  Scientific laboratory technician  . Financial resource strain: Not on file  . Food  insecurity:    Worry: Not on file    Inability: Not on file  . Transportation needs:    Medical: Not on file    Non-medical: Not on file  Tobacco Use  . Smoking status: Former Smoker    Types: Cigarettes  . Smokeless tobacco: Never Used  Substance and Sexual Activity  . Alcohol use: No    Alcohol/week: 0.0 standard drinks  . Drug use: No  . Sexual activity: Never  Lifestyle  . Physical activity:    Days per week: Not on file    Minutes per session: Not on file  . Stress: Not on file  Relationships  . Social connections:    Talks on phone: Not on file    Gets together: Not on file    Attends religious service: Not on file    Active member of club or organization: Not on file    Attends meetings of clubs or organizations: Not on file    Relationship status: Not on file  . Intimate partner violence:    Fear of current or ex partner: Not on file    Emotionally abused: Not on file    Physically abused: Not on file    Forced sexual activity: Not on file  Other Topics Concern  . Not on file  Social History Narrative  . Not on file    Family History:    Family History  Problem Relation Age of Onset  . Diabetes Mother   . Heart attack Mother   . Lung cancer Father   . Thyroid disease Sister   . Thyroid disease Sister   . Hypertension Sister   . Hypertension Sister   . Hypertension Sister   . Hypertension Sister   . Hypertension Brother   . Hypertension Brother   . Breast cancer Other 45  . Colon cancer Neg Hx   . Colon polyps Neg Hx   . Esophageal cancer Neg Hx   . Stroke Neg Hx      ROS:  Please see the history of present illness.  Review of Systems  Constitutional: Positive for malaise/fatigue and weight loss.       Fatigue - progressive since ICH  Cardiovascular: Negative for chest pain and palpitations.  Musculoskeletal: Positive for falls.  Neurological: Positive for loss of consciousness.    All other ROS reviewed and negative.     Physical  Exam/Data:   Vitals:   02/17/18 2015 02/18/18 0442 02/18/18 0851 02/18/18 0851  BP: 135/71 (!) 148/62 (!) 164/88 (!) 164/88  Pulse: 87 80 79 80  Resp:      Temp:  98 F (36.7 C) 98.1 F (36.7 C) 98.1 F (36.7 C)  TempSrc:  Oral Oral Oral  SpO2: 99% 95% 95% 100%  Weight:      Height:        Intake/Output Summary (Last 24 hours) at 02/18/2018 1239 Last data filed at 02/18/2018 0451 Gross per 24 hour  Intake 500 ml  Output 200 ml  Net 300 ml   Filed Weights   02/17/18 1251 02/17/18 1718  Weight: 77.1 kg 71.3 kg   Body mass index is 23.9 kg/m.  General:  Frail and elderly female, asleep at the time of examination / somnolent. Joined by sister and brother. HEENT: normal Neck: no JVD Vascular: No carotid bruits;  FA pulses 2+ bilaterally without bruits  Cardiac:  IRIR; no murmur  Lungs:  clear to auscultation bilaterally, no wheezing, rhonchi or rales  Abd: soft, nontender, no hepatomegaly  Ext: no bilateral lower extremity edema Musculoskeletal:  No deformities Skin: warm and dry Neuro:  Sleeping, no focal abnormalities noted Psych:  Sleeping   EKG: Refer to HPI Telemetry:  Telemetry was personally reviewed and demonstrates:  Afib with PVCs/ectopy and ventricular rate 70-80s  CV Studies:   Relevant CV Studies:  10-31-14 TTE Study Conclusions - Left ventricle: The cavity size was normal. Wall thickness was   increased in a pattern of mild LVH. Systolic function was normal.   The estimated ejection fraction was in the range of 55% to 60%.   Wall motion was normal; there were no regional wall motion   abnormalities. - Mitral valve: There was moderate regurgitation. - Left atrium: The atrium was mildly dilated. - Right atrium: The atrium was mildly dilated. - Tricuspid valve: There was moderate regurgitation. - Pulmonary arteries: Systolic pressure was moderately to severely   increased. PA peak pressure: 56 mm Hg (S). Impressions: - Normal LV function; biatrial  enlargement; moderate MR; moderate   TR; moderate to severe elevation in pulmonary pressure.  10/31/2014 Holter Afib Minimum HR 56 Maximum 137 mean 80 bpm overall indicates good rate control Longest R-R pause 2.34 seconds No symptoms reported Overall no indication for pacer RX  2014 NM Nuclear stress test 2014 was found to be abnormal.   02/2008 LHC  Proximal LAD 30%, mid LAD stent patent, proximal OM 50%.   Laboratory Data:  Chemistry Recent Labs  Lab 02/17/18 1251 02/18/18 0508  NA 137 139  K 3.6 3.7  CL 103 108  CO2 27 25  GLUCOSE 155* 96  BUN 23 19  CREATININE 0.78 0.51  CALCIUM 8.8* 8.5*  GFRNONAA >60 >60  GFRAA >60 >60  ANIONGAP 7 6    No results for input(s): PROT, ALBUMIN, AST, ALT, ALKPHOS, BILITOT in the last 168 hours. Hematology Recent Labs  Lab 02/17/18 1251 02/18/18 0508  WBC 9.3 9.4  RBC 3.80* 3.40*  HGB 11.4* 10.2*  HCT 35.1* 30.9*  MCV 92.4 90.9  MCH 30.0 30.0  MCHC 32.5 33.0  RDW 15.3 15.4  PLT 275 262   Cardiac Enzymes Recent Labs  Lab 02/17/18 1251 02/17/18 1719 02/17/18 2253 02/18/18 0508 02/18/18 1139  TROPONINI 0.03* 0.03* 0.03* 0.08* 0.03*   No results for input(s): TROPIPOC in the last 168 hours.  BNP Recent Labs  Lab 02/17/18 1251  BNP 324.0*    DDimer No results for input(s): DDIMER in the last 168 hours.  Radiology/Studies:  Ct Head Wo Contrast  Result Date: 02/17/2018 CLINICAL DATA:  Syncope with right-sided weakness EXAM: CT HEAD WITHOUT CONTRAST TECHNIQUE: Contiguous axial images were obtained from the base of the skull through the vertex without intravenous contrast. COMPARISON:  CT brain 01/11/2018, 12/29/2017, 07/29/2016 FINDINGS: Brain: No acute territorial infarction or new intracranial hemorrhage. Further resolution of right temporal lobe hemorrhage and hemorrhagic material within the right lateral ventricle which has become more hypodense. Encephalomalacia is developing in the right temporal lobe.  Moderate-to-marked atrophy. Moderate to marked small vessel ischemic changes of the white matter. Decreased midline shift as compared with prior CT. Vascular: No hyperdense vessel. Vertebral and carotid vascular calcification Skull: No acute fracture Sinuses/Orbits: No acute finding. Other: None IMPRESSION: 1. No new or acute intracranial abnormality since 01/11/2018 CT. Right temporal lobe and right intraventricular hemorrhage show  further resolution. Developing encephalomalacia in the right temporal lobe. 2. Atrophy with chronic small vessel ischemic changes of the white matter. Electronically Signed   By: Donavan Foil M.D.   On: 02/17/2018 15:14    Assessment and Plan:   Elevated troponin in setting of syncope v near syncope with h/o CAD - No chest pain reported, per sister. Troponin minimally elevated and flat trending with peak 0.08. EKG without acute changes. Of note, several recent admissions for syncope/near syncope in the setting of infection as above in HPI.  She does have documented history of syncope with first episode in 2006.  She also has a documented history of dehydration. -CAD s/p Cypher DES to the LAD in 2006 with LHC as above in 2010 showing patent stent.  2014 abnormal nuclear stress test but cardiac catheterization deferred at that time and preference for medical management. - Etiology of elevation likely supply demand ischemia in the setting of UTI with primary concern at this time to resolve her ongoing infection given prolonged history of UTI. Reported a fall yesterday, which may be due at least in part to her ongoing infection.  Patient was also notably hypotensive upon evaluation in the ED, which improved with IVF and likely also contributed to her symptoms and fall.  - No plans for invasive ischemic procedures at this time.  - Continue medical management. No ASA or anticoagulation d/t ICH.  Continue PTA medications irbesartan 150 mg p.o. q. 1800, Lopressor 50 mg Pope bid. Nitro  as needed for CP. Recommend continued outpatient follow-up with Dr. [name]. Consider updating lipid panel at that time with 2008 LDL 95.    UTI  - Repeated UTI v ongoing infection. Suspect infection / dehydration is contributing to feelings of weakness and syncope / near syncope. Counseled family to increase hydration at home / as outpatient to prevent recurrence. Recommend recheck UA as outpatient in 2 weeks after discharge to ensure infection resolved   Chronic Afib - Not on anticoagulation d/t h/o recent ICH. CHA2DS2VASc score of at least 4 (agex2, ICH, female).  Previously on Xarelto for anticoagulation, stopped d/t to [place]. - Rate well controlled on metoprolol.  Documented history of labile blood pressure.  Presented with hypotension -  currently hypertensive after IVF.  Continue BB as BP allows for optimal heart rate control as does have documented issue with syncope bradycardia/hypotension.  H/o ICH - No anticoagulation/antiplatelet therapy advised.    For questions or updates, please contact [place] Please consult [email or web address] for contact info under     Signed, [name], PA-C  02/18/2018 12:39 PM

## 2018-02-19 ENCOUNTER — Encounter: Payer: Medicare Other | Admitting: Physical Medicine & Rehabilitation

## 2018-02-19 DIAGNOSIS — I639 Cerebral infarction, unspecified: Secondary | ICD-10-CM

## 2018-02-19 DIAGNOSIS — I10 Essential (primary) hypertension: Secondary | ICD-10-CM

## 2018-02-19 DIAGNOSIS — F0390 Unspecified dementia without behavioral disturbance: Secondary | ICD-10-CM

## 2018-02-19 LAB — LIPID PANEL
Cholesterol: 170 mg/dL (ref 0–200)
HDL: 48 mg/dL (ref >40)
LDL Cholesterol: 104 mg/dL — ABNORMAL HIGH (ref 0–99)
Total CHOL/HDL Ratio: 3.5 RATIO
Triglycerides: 91 mg/dL (ref <150)
VLDL: 18 mg/dL (ref 0–40)

## 2018-02-19 NOTE — Progress Notes (Signed)
Physical Therapy Treatment Patient Details Name: [name] MRN: [id number] DOB: 1926/04/30 Today's Date: 02/19/2018    History of Present Illness 85 y.o. female was referred to PT for mobility, noted she was in bed most of the bedtime at SNF.  Pt has elevated troponin, encephalomalacia, but cleared for head on CT.  R hemiparesis presenting now.  PMHx:  A-fib, CAD, HTN, ICH, ankle fracture, stents, UTI    PT Comments    Pt was assisted at her request to sit up side of bed then pivot to the chair next to the bed.  Her family had called and encouraged her, so pt readily agreed to be in chair.  Talked with her CNA about the positioning and that pt was up feeding herself in the chair.  Pt will continue on with PT as tolerated, making effort to increase LE strength and standing tolerance.  Pt was not able to stand long enough to do orthostatics so will try when she is ready.   Follow Up Recommendations  SNF     Equipment Recommendations  None recommended by PT    Recommendations for Other Services       Precautions / Restrictions Precautions Precautions: Fall Restrictions Weight Bearing Restrictions: No    Mobility  Bed Mobility Overal bed mobility: Needs Assistance Bed Mobility: Supine to Sit     Supine to sit: Min assist;Mod assist     General bed mobility comments: mod assist only to finish scooting to EOB  Transfers Overall transfer level: Needs assistance Equipment used: 1 person hand held assist Transfers: Sit to/from Stand Sit to Stand: Mod assist         General transfer comment: follows instructions with PT to stand and pivot with tactile cues esp to slide feet toward chair in the pivot  Ambulation/Gait             General Gait Details: unable   Stairs             Wheelchair Mobility    Modified Rankin (Stroke Patients Only)       Balance     Sitting balance-Leahy Scale: Fair     Standing balance support: Bilateral upper  extremity supported;During functional activity Standing balance-Leahy Scale: Poor                              Cognition Arousal/Alertness: Awake/alert Behavior During Therapy: Flat affect Overall Cognitive Status: Impaired/Different from baseline Area of Impairment: Following commands;Safety/judgement;Awareness;Problem solving                       Following Commands: Follows one step commands with increased time Safety/Judgement: Decreased awareness of safety;Decreased awareness of deficits Awareness: Intellectual Problem Solving: Slow processing;Decreased initiation;Difficulty sequencing;Requires verbal cues;Requires tactile cues General Comments: pt follows instructions with PT to progress to chair with assistance to move LE's and then posture support of pillows in chair on alarm pad      Exercises      General Comments General comments (skin integrity, edema, etc.): sat her in recliner but elevated legs to give more postural support      Pertinent Vitals/Pain Pain Assessment: No/denies pain    Home Living                      Prior Function            PT Goals (current goals can now  be found in the care plan section) Acute Rehab PT Goals Patient Stated Goal: get OOB  Progress towards PT goals: Progressing toward goals    Frequency    Min 2X/week      PT Plan Current plan remains appropriate    Co-evaluation              AM-PAC PT "6 Clicks" Mobility   Outcome Measure  Help needed turning from your back to your side while in a flat bed without using bedrails?: A Little Help needed moving from lying on your back to sitting on the side of a flat bed without using bedrails?: A Lot Help needed moving to and from a bed to a chair (including a wheelchair)?: A Lot Help needed standing up from a chair using your arms (e.g., wheelchair or bedside chair)?: A Lot Help needed to walk in hospital room?: Total Help needed climbing  3-5 steps with a railing? : Total 6 Click Score: 11    End of Session Equipment Utilized During Treatment: Gait belt Activity Tolerance: Patient limited by fatigue Patient left: with call bell/phone within reach;in chair;with chair alarm set Nurse Communication: Mobility status PT Visit Diagnosis: Unsteadiness on feet (R26.81);Other abnormalities of gait and mobility (R26.89);Muscle weakness (generalized) (M62.81);Difficulty in walking, not elsewhere classified (R26.2);Dizziness and giddiness (R42)     Time: 0277-4128 PT Time Calculation (min) (ACUTE ONLY): 16 min  Charges:  $Therapeutic Activity: 8-22 mins                    Ramond Dial 02/19/2018, 5:46 PM   Mee Hives, PT MS Acute Rehab Dept. Number: [place] [facility] [place]

## 2018-02-19 NOTE — Progress Notes (Signed)
Progress Note  Patient Name: [name] Date of Encounter: 02/19/2018  Primary Cardiologist: Dr. [name], [facility]  Subjective   No complaints this morning, increasingly alert Ate some breakfast, overall feels well Reports she has not made a very good recovery since her " stroke" We did discuss urinary tract infection last month and again this admission She has received 2 days of antibiotics starting yesterday and this morning Increasingly communicative  Inpatient Medications    Scheduled Meds: . calcium carbonate  3 tablet Oral Q breakfast  . cholecalciferol  1,000 Units Oral Daily  . docusate sodium  100 mg Oral BID  . feeding supplement (ENSURE ENLIVE)  237 mL Oral BID BM  . feeding supplement (PRO-STAT SUGAR FREE 64)  30 mL Oral BID  . irbesartan  150 mg Oral Daily  . levETIRAcetam  500 mg Oral BID  . magnesium oxide  200 mg Oral BID  . mouth rinse  15 mL Mouth Rinse BID  . metoprolol tartrate  50 mg Oral BID  . multivitamin with minerals  1 tablet Oral Daily  . multivitamin-lutein  1 capsule Oral Daily  . pantoprazole  40 mg Oral Daily  . polyethylene glycol  17 g Oral Daily  . potassium chloride SA  20 mEq Oral Daily  . tamsulosin  0.4 mg Oral QPC supper  . vitamin B-12  1,000 mcg Oral Daily  . vitamin C  500 mg Oral Daily  . vitamin E  400 Units Oral Daily   Continuous Infusions: . ceFEPime (MAXIPIME) IV 1 g (02/19/18 0919)   PRN Meds: acetaminophen **OR** acetaminophen, nitroGLYCERIN, polyvinyl alcohol, simethicone   Vital Signs    Vitals:   02/18/18 1736 02/18/18 2022 02/19/18 0430 02/19/18 0803  BP: (!) 156/81 137/75 (!) 149/68 (!) 156/75  Pulse:  67 75 80  Resp:  18 20 17   Temp:  98.2 F (36.8 C) (!) 97.3 F (36.3 C) 98.2 F (36.8 C)  TempSrc:  Oral Oral   SpO2:  97% 98% 99%  Weight:      Height:        Intake/Output Summary (Last 24 hours) at 02/19/2018 1247 Last data filed at 02/19/2018 0615 Gross per 24 hour  Intake 300 ml  Output 1650  ml  Net -1350 ml   Filed Weights   02/17/18 1251 02/17/18 1718  Weight: 77.1 kg 71.3 kg    Telemetry    Atrial fibrillation rate 70 to 80 bpm- Personally Reviewed  ECG     - Personally Reviewed  Physical Exam   Constitutional: Alert, communicative HENT:  Head: Grossly normal Eyes:  no discharge. No scleral icterus.  Neck: No JVD, no carotid bruits  Cardiovascular: Irregularly irregular, no murmurs appreciated Pulmonary/Chest: Scattered Rales, poor inspiratory effort grossly clear Abdominal: Soft.  no distension.  no tenderness.  Musculoskeletal: Normal range of motion Neurological: Full exam not performed Skin: Skin warm and dry Psychiatric: normal affect, pleasant   Labs    Chemistry Recent Labs  Lab 02/17/18 1251 02/18/18 0508  NA 137 139  K 3.6 3.7  CL 103 108  CO2 27 25  GLUCOSE 155* 96  BUN 23 19  CREATININE 0.78 0.51  CALCIUM 8.8* 8.5*  GFRNONAA >60 >60  GFRAA >60 >60  ANIONGAP 7 6     Hematology Recent Labs  Lab 02/17/18 1251 02/18/18 0508  WBC 9.3 9.4  RBC 3.80* 3.40*  HGB 11.4* 10.2*  HCT 35.1* 30.9*  MCV 92.4 90.9  MCH 30.0  30.0  MCHC 32.5 33.0  RDW 15.3 15.4  PLT 275 262    Cardiac Enzymes Recent Labs  Lab 02/17/18 1719 02/17/18 2253 02/18/18 0508 02/18/18 1139  TROPONINI 0.03* 0.03* 0.08* 0.03*   No results for input(s): TROPIPOC in the last 168 hours.   BNP Recent Labs  Lab 02/17/18 1251  BNP 324.0*     DDimer No results for input(s): DDIMER in the last 168 hours.   Radiology    Ct Head Wo Contrast  Result Date: 02/17/2018 CLINICAL DATA:  Syncope with right-sided weakness EXAM: CT HEAD WITHOUT CONTRAST TECHNIQUE: Contiguous axial images were obtained from the base of the skull through the vertex without intravenous contrast. COMPARISON:  CT brain 01/11/2018, 12/29/2017, 07/29/2016 FINDINGS: Brain: No acute territorial infarction or new intracranial hemorrhage. Further resolution of right temporal lobe hemorrhage  and hemorrhagic material within the right lateral ventricle which has become more hypodense. Encephalomalacia is developing in the right temporal lobe. Moderate-to-marked atrophy. Moderate to marked small vessel ischemic changes of the white matter. Decreased midline shift as compared with prior CT. Vascular: No hyperdense vessel. Vertebral and carotid vascular calcification Skull: No acute fracture Sinuses/Orbits: No acute finding. Other: None IMPRESSION: 1. No new or acute intracranial abnormality since 01/11/2018 CT. Right temporal lobe and right intraventricular hemorrhage show further resolution. Developing encephalomalacia in the right temporal lobe. 2. Atrophy with chronic small vessel ischemic changes of the white matter. Electronically Signed   By: Donavan Foil M.D.   On: 02/17/2018 15:14    Cardiac Studies     Patient Profile     85 year old woman followed by cardiology in [place], coronary artery disease, previous stent to the mid LAD, chronic atrial fibrillation previously on anticoagulation though stopped 1 month ago after intracranial bleed, recent E. coli and Pseudomonas UTI, urine retention, labile blood pressure, Residual visual field deficits, presenting with syncope while using the toilet  Assessment & Plan    1) elevated troponin Known coronary artery disease Demand ischemia in the setting of hypotension, syncope likely vasovagal following to the bathroom Also in the setting of UTI Given her debilitated state, no further ischemic work-up at this time --Blood pressure is now stable, receiving treatment for urinary tract infection  2) syncope Suspect secondary to hypotension in the setting of urinary tract infection, dehydration, Unable to exclude vasovagal episode following bowel movement -Per family baseline has been poor since intracranial bleed -Systolic pressures running 130s up to 150s heart rates 60s to 80 No further cardiac work-up at this time -Would check  orthostatics when she first gets out of bed working with PT  3) urinary tract infection Previously grew E. coli and Pseudomonas 1 month ago -Recurrent UTI this admission cultures showing Klebsiella Mentation improving after second day of antibiotics  4) chronic atrial fibrillation Rate relatively well controlled Not on anticoagulation given previous head bleed  5) cad with stable angina  no further ischemic work-up at this time given severe debility   [facility] will sign off.   Medication Recommendations: No changes to medications Other recommendations (labs, testing, etc): No further testing needed Follow up as an outpatient: Follow-up per routine with cardiology in [place], [place]  For questions or updates, please contact [facility] Please consult [email or web address] for contact info under        Signed, Ida Rogue, MD  02/19/2018, 12:47 PM

## 2018-02-19 NOTE — Progress Notes (Signed)
Pharmacy Antibiotic Note  [name] is a 85 y.o. female admitted on 02/17/2018 with UTI.  Pharmacy has been consulted for cefepime dosing. Pt has a history of pseudomonas and ecoli UTI from 12/2017.   Plan: Continue Cefepime 1 g q12H  Height: 5\' 8"  (172.7 cm) Weight: 157 lb 3.2 oz (71.3 kg) IBW/kg (Calculated) : 63.9  Temp (24hrs), Avg:97.9 F (36.6 C), Min:97.3 F (36.3 C), Max:98.2 F (36.8 C)  Recent Labs  Lab 02/17/18 1251 02/18/18 0508  WBC 9.3 9.4  CREATININE 0.78 0.51    Estimated Creatinine Clearance: 48.1 mL/min (by C-G formula based on SCr of 0.51 mg/dL).    Allergies  Allergen Reactions  . Amlodipine Shortness Of Breath and Swelling    Edema in legs  . Methyldopa Shortness Of Breath and Swelling  . Actonel [Risedronate Sodium] Other (See Comments)    Caused a lump to come up in throat  . Ciprofloxacin Hcl     hallucinations  . Hctz [Hydrochlorothiazide] Itching    Pt reports causing "itching under the skin, with no rash noted."  . Hydralazine Hcl     Unknown, per pt   . Lactose Intolerance (Gi)   . Lisinopril Cough  . Milk-Related Compounds     diarrhea  . Oxycodone Hcl     hallucinations  . Spironolactone     Itchiing  . Tape Itching  . Valsartan     Unknown, per pt    Antimicrobials this admission: 1/1 Cefepime >>    Dose adjustments this admission: CrCl 30-60 adjusted to 1 g q12H  Microbiology results: 1/1 UCx > 100,000 CFU GNRs   Thank you for allowing pharmacy to be a part of this patient's care.   Paticia Stack, PharmD Pharmacy Resident  02/19/2018 9:15 AM

## 2018-02-19 NOTE — Progress Notes (Signed)
[place] [facility] [place] NAME: [name]    MR#:  [id number]  DATE OF BIRTH:  [birth year]  SUBJECTIVE:  CHIEF COMPLAINT:   Chief Complaint  Patient presents with  . Loss of Consciousness   Patient is demented, she looks better. REVIEW OF SYSTEMS:  Review of Systems  Unable to perform ROS: Dementia    DRUG ALLERGIES:   Allergies  Allergen Reactions  . Amlodipine Shortness Of Breath and Swelling    Edema in legs  . Methyldopa Shortness Of Breath and Swelling  . Actonel [Risedronate Sodium] Other (See Comments)    Caused a lump to come up in throat  . Ciprofloxacin Hcl     hallucinations  . Hctz [Hydrochlorothiazide] Itching    Pt reports causing "itching under the skin, with no rash noted."  . Hydralazine Hcl     Unknown, per pt   . Lactose Intolerance (Gi)   . Lisinopril Cough  . Milk-Related Compounds     diarrhea  . Oxycodone Hcl     hallucinations  . Spironolactone     Itchiing  . Tape Itching  . Valsartan     Unknown, per pt   VITALS:  Blood pressure (!) 156/75, pulse 80, temperature 98.2 F (36.8 C), resp. rate 17, height 5\' 8"  (1.727 m), weight 71.3 kg, SpO2 99 %. PHYSICAL EXAMINATION:  Physical Exam Constitutional:      General: She is not in acute distress. HENT:     Head: Normocephalic.     Mouth/Throat:     Mouth: Mucous membranes are moist.  Eyes:     General: No scleral icterus.    Conjunctiva/sclera: Conjunctivae normal.     Pupils: Pupils are equal, round, and reactive to light.  Neck:     Musculoskeletal: Normal range of motion and neck supple.     Vascular: No JVD.     Trachea: No tracheal deviation.  Cardiovascular:     Rate and Rhythm: Normal rate and regular rhythm.     Heart sounds: Normal heart sounds. No murmur. No gallop.   Pulmonary:     Effort: Pulmonary effort is normal. No respiratory distress.     Breath sounds: Normal breath sounds. No wheezing or rales.  Abdominal:   General: Bowel sounds are normal. There is no distension.     Palpations: Abdomen is soft.     Tenderness: There is no abdominal tenderness. There is no rebound.  Musculoskeletal: Normal range of motion.        General: No tenderness.  Skin:    Findings: No erythema or rash.  Neurological:     Mental Status: She is alert.     Cranial Nerves: No cranial nerve deficit.  Psychiatric:     Comments: Demented    LABORATORY PANEL:  Female CBC Recent Labs  Lab 02/18/18 0508  WBC 9.4  HGB 10.2*  HCT 30.9*  PLT 262   ------------------------------------------------------------------------------------------------------------------ Chemistries  Recent Labs  Lab 02/18/18 0508  NA 139  K 3.7  CL 108  CO2 25  GLUCOSE 96  BUN 19  CREATININE 0.51  CALCIUM 8.5*   RADIOLOGY:  No results found. ASSESSMENT AND PLAN:   85 yr old female patientwith a known history of intracranial hemorrhage, chronic atrial fibrillation, coronary artery disease with history of stent placement, hiatal hernia, GERD, hypertension, hyperlipidemia presented to ER for passing out  -Syncope Vaso vagal in etiology The patient is treated with IV fluids  -Urinary  tract infection History of pseudomonal and ecoli uti Continue IV zosyn, urine culture show Klebsiella pneumonia.  Follow-up sensitivity.  -Confusion could be secondary to UTI and dementia.  -Dehydration Improved with IV fluids  -History of intracranial hemorrhage Avoid blood thinner medications  -Chronic atrial fibrillation Continue Lopressor, not a candidate for anticoagulation.  Hypertension.  Continue home hypertension medication.  All the records are reviewed and case discussed with Care Management/Social Worker. Management plans discussed with the patient's granddaughter and they are in agreement.  CODE STATUS: DNR  TOTAL TIME TAKING CARE OF THIS PATIENT: 27 minutes.   More than 50% of the time was spent in  counseling/coordination of care: YES  POSSIBLE D/C IN 1-2 DAYS, DEPENDING ON CLINICAL CONDITION.   Demetrios Loll M.D on 02/19/2018 at 2:53 PM  Between 7am to 6pm - Pager - [phone]  After 6pm go to [email or web address] - [facility]

## 2018-02-19 NOTE — Care Management Important Message (Signed)
Copy of signed Medicare IM left with patient in room. 

## 2018-02-20 DIAGNOSIS — W19XXXA Unspecified fall, initial encounter: Secondary | ICD-10-CM | POA: Diagnosis not present

## 2018-02-20 DIAGNOSIS — S40012A Contusion of left shoulder, initial encounter: Secondary | ICD-10-CM | POA: Diagnosis not present

## 2018-02-20 DIAGNOSIS — R42 Dizziness and giddiness: Secondary | ICD-10-CM | POA: Diagnosis not present

## 2018-02-20 DIAGNOSIS — I119 Hypertensive heart disease without heart failure: Secondary | ICD-10-CM | POA: Diagnosis not present

## 2018-02-20 DIAGNOSIS — I69191 Dysphagia following nontraumatic intracerebral hemorrhage: Secondary | ICD-10-CM | POA: Diagnosis not present

## 2018-02-20 DIAGNOSIS — S0990XA Unspecified injury of head, initial encounter: Secondary | ICD-10-CM | POA: Diagnosis not present

## 2018-02-20 DIAGNOSIS — Z9861 Coronary angioplasty status: Secondary | ICD-10-CM | POA: Diagnosis not present

## 2018-02-20 DIAGNOSIS — Z23 Encounter for immunization: Secondary | ICD-10-CM | POA: Diagnosis not present

## 2018-02-20 DIAGNOSIS — S79912A Unspecified injury of left hip, initial encounter: Secondary | ICD-10-CM | POA: Diagnosis not present

## 2018-02-20 DIAGNOSIS — S01112A Laceration without foreign body of left eyelid and periocular area, initial encounter: Secondary | ICD-10-CM | POA: Diagnosis not present

## 2018-02-20 DIAGNOSIS — G4700 Insomnia, unspecified: Secondary | ICD-10-CM | POA: Diagnosis not present

## 2018-02-20 DIAGNOSIS — F419 Anxiety disorder, unspecified: Secondary | ICD-10-CM | POA: Diagnosis not present

## 2018-02-20 DIAGNOSIS — M25552 Pain in left hip: Secondary | ICD-10-CM | POA: Diagnosis not present

## 2018-02-20 DIAGNOSIS — S0993XA Unspecified injury of face, initial encounter: Secondary | ICD-10-CM | POA: Diagnosis present

## 2018-02-20 DIAGNOSIS — I69115 Cognitive social or emotional deficit following nontraumatic intracerebral hemorrhage: Secondary | ICD-10-CM | POA: Diagnosis not present

## 2018-02-20 DIAGNOSIS — M25562 Pain in left knee: Secondary | ICD-10-CM | POA: Diagnosis not present

## 2018-02-20 DIAGNOSIS — M6281 Muscle weakness (generalized): Secondary | ICD-10-CM | POA: Diagnosis not present

## 2018-02-20 DIAGNOSIS — E46 Unspecified protein-calorie malnutrition: Secondary | ICD-10-CM | POA: Diagnosis not present

## 2018-02-20 DIAGNOSIS — R569 Unspecified convulsions: Secondary | ICD-10-CM | POA: Diagnosis not present

## 2018-02-20 DIAGNOSIS — Z741 Need for assistance with personal care: Secondary | ICD-10-CM | POA: Diagnosis not present

## 2018-02-20 DIAGNOSIS — S51002A Unspecified open wound of left elbow, initial encounter: Secondary | ICD-10-CM | POA: Diagnosis not present

## 2018-02-20 DIAGNOSIS — S299XXA Unspecified injury of thorax, initial encounter: Secondary | ICD-10-CM | POA: Diagnosis not present

## 2018-02-20 DIAGNOSIS — S8992XA Unspecified injury of left lower leg, initial encounter: Secondary | ICD-10-CM | POA: Diagnosis not present

## 2018-02-20 DIAGNOSIS — Z7401 Bed confinement status: Secondary | ICD-10-CM | POA: Diagnosis not present

## 2018-02-20 DIAGNOSIS — R55 Syncope and collapse: Secondary | ICD-10-CM | POA: Diagnosis not present

## 2018-02-20 DIAGNOSIS — N390 Urinary tract infection, site not specified: Secondary | ICD-10-CM | POA: Diagnosis not present

## 2018-02-20 DIAGNOSIS — I69391 Dysphagia following cerebral infarction: Secondary | ICD-10-CM | POA: Diagnosis not present

## 2018-02-20 DIAGNOSIS — I615 Nontraumatic intracerebral hemorrhage, intraventricular: Secondary | ICD-10-CM | POA: Diagnosis not present

## 2018-02-20 DIAGNOSIS — Y9389 Activity, other specified: Secondary | ICD-10-CM | POA: Diagnosis not present

## 2018-02-20 DIAGNOSIS — R58 Hemorrhage, not elsewhere classified: Secondary | ICD-10-CM | POA: Diagnosis not present

## 2018-02-20 DIAGNOSIS — S20219A Contusion of unspecified front wall of thorax, initial encounter: Secondary | ICD-10-CM | POA: Diagnosis not present

## 2018-02-20 DIAGNOSIS — S4992XA Unspecified injury of left shoulder and upper arm, initial encounter: Secondary | ICD-10-CM | POA: Diagnosis not present

## 2018-02-20 DIAGNOSIS — E860 Dehydration: Secondary | ICD-10-CM | POA: Diagnosis not present

## 2018-02-20 DIAGNOSIS — Z79899 Other long term (current) drug therapy: Secondary | ICD-10-CM | POA: Diagnosis not present

## 2018-02-20 DIAGNOSIS — R300 Dysuria: Secondary | ICD-10-CM | POA: Diagnosis not present

## 2018-02-20 DIAGNOSIS — I2510 Atherosclerotic heart disease of native coronary artery without angina pectoris: Secondary | ICD-10-CM | POA: Diagnosis not present

## 2018-02-20 DIAGNOSIS — R079 Chest pain, unspecified: Secondary | ICD-10-CM | POA: Diagnosis not present

## 2018-02-20 DIAGNOSIS — S8012XA Contusion of left lower leg, initial encounter: Secondary | ICD-10-CM | POA: Diagnosis not present

## 2018-02-20 DIAGNOSIS — I619 Nontraumatic intracerebral hemorrhage, unspecified: Secondary | ICD-10-CM | POA: Diagnosis not present

## 2018-02-20 DIAGNOSIS — R2681 Unsteadiness on feet: Secondary | ICD-10-CM | POA: Diagnosis not present

## 2018-02-20 DIAGNOSIS — I10 Essential (primary) hypertension: Secondary | ICD-10-CM | POA: Diagnosis not present

## 2018-02-20 DIAGNOSIS — R2689 Other abnormalities of gait and mobility: Secondary | ICD-10-CM | POA: Diagnosis not present

## 2018-02-20 DIAGNOSIS — S199XXA Unspecified injury of neck, initial encounter: Secondary | ICD-10-CM | POA: Diagnosis not present

## 2018-02-20 DIAGNOSIS — Y998 Other external cause status: Secondary | ICD-10-CM | POA: Diagnosis not present

## 2018-02-20 DIAGNOSIS — F321 Major depressive disorder, single episode, moderate: Secondary | ICD-10-CM | POA: Diagnosis not present

## 2018-02-20 DIAGNOSIS — E559 Vitamin D deficiency, unspecified: Secondary | ICD-10-CM | POA: Diagnosis not present

## 2018-02-20 DIAGNOSIS — N3000 Acute cystitis without hematuria: Secondary | ICD-10-CM | POA: Diagnosis not present

## 2018-02-20 DIAGNOSIS — E569 Vitamin deficiency, unspecified: Secondary | ICD-10-CM | POA: Diagnosis not present

## 2018-02-20 DIAGNOSIS — S8002XA Contusion of left knee, initial encounter: Secondary | ICD-10-CM | POA: Diagnosis not present

## 2018-02-20 DIAGNOSIS — R52 Pain, unspecified: Secondary | ICD-10-CM | POA: Diagnosis not present

## 2018-02-20 DIAGNOSIS — Z87891 Personal history of nicotine dependence: Secondary | ICD-10-CM | POA: Diagnosis not present

## 2018-02-20 DIAGNOSIS — Y92122 Bedroom in nursing home as the place of occurrence of the external cause: Secondary | ICD-10-CM | POA: Diagnosis not present

## 2018-02-20 DIAGNOSIS — R0902 Hypoxemia: Secondary | ICD-10-CM | POA: Diagnosis not present

## 2018-02-20 DIAGNOSIS — R279 Unspecified lack of coordination: Secondary | ICD-10-CM | POA: Diagnosis not present

## 2018-02-20 DIAGNOSIS — M2550 Pain in unspecified joint: Secondary | ICD-10-CM | POA: Diagnosis not present

## 2018-02-20 DIAGNOSIS — W06XXXA Fall from bed, initial encounter: Secondary | ICD-10-CM | POA: Diagnosis not present

## 2018-02-20 DIAGNOSIS — R109 Unspecified abdominal pain: Secondary | ICD-10-CM | POA: Diagnosis not present

## 2018-02-20 DIAGNOSIS — K219 Gastro-esophageal reflux disease without esophagitis: Secondary | ICD-10-CM | POA: Diagnosis not present

## 2018-02-20 DIAGNOSIS — G3184 Mild cognitive impairment, so stated: Secondary | ICD-10-CM | POA: Diagnosis not present

## 2018-02-20 DIAGNOSIS — R410 Disorientation, unspecified: Secondary | ICD-10-CM | POA: Diagnosis not present

## 2018-02-20 DIAGNOSIS — R600 Localized edema: Secondary | ICD-10-CM | POA: Diagnosis not present

## 2018-02-20 LAB — URINE CULTURE
Culture: >=100000 — AB
Special Requests: NORMAL

## 2018-02-20 MED ORDER — CEPHALEXIN 500 MG PO CAPS
500.0000 mg | ORAL_CAPSULE | Freq: Two times a day (BID) | ORAL | Status: DC
  Administered 2018-02-20: 500 mg via ORAL
  Filled 2018-02-20: qty 1

## 2018-02-20 MED ORDER — CEPHALEXIN 500 MG PO CAPS: 500.0000 mg | ORAL_CAPSULE | Freq: Two times a day (BID) | ORAL | 0 refills | Status: DC

## 2018-02-20 NOTE — Progress Notes (Signed)
Family informed regarding pending transfer. Report called to facility, [facility] LPN. Asked regarding antibiotic stop date. Paged Dr. Bridgett Larsson. Replied that it will be for 2 days. EMS called for transport.

## 2018-02-20 NOTE — Discharge Summary (Addendum)
[facility] NAME: [name]    MR#:  [id number]  DATE OF BIRTH:  1926/06/21  DATE OF ADMISSION:  02/17/2018   ADMITTING PHYSICIAN: Saundra Shelling, MD  DATE OF DISCHARGE: 02/19/2018 PRIMARY CARE PHYSICIAN: Merrilee Seashore, MD   ADMISSION DIAGNOSIS:  Syncope and collapse [R55] Urinary tract infection, acute [N39.0] DISCHARGE DIAGNOSIS:  Active Problems:   Syncope and collapse  SECONDARY DIAGNOSIS:   Past Medical History:  Diagnosis Date  . Ankle fracture   . Anxiety   . Arthritis   . Barrett's esophagus   . Chronic atrial fibrillation    a. Pt previously declined DCCV.  [place] Kitchen Coronary artery disease    a. s/p Cypher DES to LAD in 2006;  b. Last LHC 02/2008:  pLAD 30%, mLAD stent patent, pOM 50%, EF 65%;  c. Lexiscan Myoview 7/14:  Intermediate risk, dist ant and inf-lat ischemia, EF 66% - patient declined cath and elected medical therapy.  Marland Kitchen GERD (gastroesophageal reflux disease)   . Hiatal hernia   . Hx of echocardiogram    Echocardiogram 08/17/12: EF 55-60%, MAC, mild MR, mild LAE, mild RVE, mild to moderate RAE, moderate TR, mildly increased pulmonary artery systolic pressures  . Hyperlipidemia   . Hypertension   . Mitral regurgitation    a. Mod by echo 10/2014.  . Obesity   . Pulmonary hypertension ([place])    a. Mod-severely elevated PA pressures by echo 10/2014.  [place] Kitchen QT prolongation    a. During 10/2014 admit, QTC 572m.  . Tricuspid regurgitation    a. Mod by echo 10/2014.  .Mar[place] KitchenUTI (lower urinary tract infection) 03/2015   HOSPITAL COURSE:  85yr old female patientwith a known history ofintracranial hemorrhage, chronic atrial fibrillation, coronary artery disease with history of stent placement, hiatal hernia, GERD, hypertension, hyperlipidemiapresented to ER for passing out  -Syncope Vaso vagal in etiology The patient is treated with IV fluids  -Urinary tract infection History of pseudomonal and ecoli uti She  was treated with IV zosyn, urine culture show Klebsiella pneumonia, sensitive to many abx. Changed to keflex for 2 days.  -Confusion could be secondary to UTI and dementia.  -Dehydration Improved with IV fluids  -History of intracranial hemorrhage Avoid blood thinner medications  -Chronic atrial fibrillation Continue Lopressor, not a candidate for anticoagulation.  Hypertension.  Continue home hypertension medication.  DISCHARGE CONDITIONS:  Stable, discharge to SNF today. CONSULTS OBTAINED:   DRUG ALLERGIES:   Allergies  Allergen Reactions  . Amlodipine Shortness Of Breath and Swelling    Edema in legs  . Methyldopa Shortness Of Breath and Swelling  . Actonel [Risedronate Sodium] Other (See Comments)    Caused a lump to come up in throat  . Ciprofloxacin Hcl     hallucinations  . Hctz [Hydrochlorothiazide] Itching    Pt reports causing "itching under the skin, with no rash noted."  . Hydralazine Hcl     Unknown, per pt   . Lactose Intolerance (Gi)   . Lisinopril Cough  . Milk-Related Compounds     diarrhea  . Oxycodone Hcl     hallucinations  . Spironolactone     Itchiing  . Tape Itching  . Valsartan     Unknown, per pt   DISCHARGE MEDICATIONS:   Allergies as of 02/20/2018      Reactions   Amlodipine Shortness Of Breath, Swelling   Edema in legs   Methyldopa Shortness Of Breath, Swelling   Actonel [  risedronate Sodium] Other (See Comments)   Caused a lump to come up in throat   Ciprofloxacin Hcl    hallucinations   Hctz [hydrochlorothiazide] Itching   Pt reports causing "itching under the skin, with no rash noted."   Hydralazine Hcl    Unknown, per pt   Lactose Intolerance (gi)    Lisinopril Cough   Milk-related Compounds    diarrhea   Oxycodone Hcl    hallucinations   Spironolactone    Itchiing   Tape Itching   Valsartan    Unknown, per pt      Medication List    TAKE these medications   acetaminophen 325 MG tablet Commonly known as:   TYLENOL Take 2 tablets (650 mg total) by mouth every 6 (six) hours.   carboxymethylcellulose 0.5 % Soln Commonly known as:  REFRESH PLUS Place 1 drop into both eyes as needed (dry eyes).   cephALEXin 500 MG capsule Commonly known as:  KEFLEX Take 1 capsule (500 mg total) by mouth every 12 (twelve) hours.   docusate sodium 100 MG capsule Commonly known as:  COLACE Take 1 capsule (100 mg total) by mouth 2 (two) times daily.   irbesartan 150 MG tablet Commonly known as:  AVAPRO Take 1 tablet (150 mg total) by mouth daily.   levETIRAcetam 500 MG tablet Commonly known as:  KEPPRA Take 1 tablet (500 mg total) by mouth 2 (two) times daily.   magnesium oxide 400 (241.3 Mg) MG tablet Commonly known as:  MAG-OX Take 0.5 tablets (200 mg total) by mouth 2 (two) times daily.   metoprolol tartrate 50 MG tablet Commonly known as:  LOPRESSOR Take 1 tablet (50 mg total) by mouth 2 (two) times daily.   antiseptic oral rinse Liqd 15 mLs by Mouth Rinse route 2 (two) times daily.   mouth rinse Liqd solution 15 mLs by Mouth Rinse route 2 (two) times daily.   multivitamin with minerals Tabs tablet Take 1 tablet by mouth daily.   MUSCLE RUB 10-15 % Crea Apply 1 application topically every 6 (six) hours. To bilateral knees   nitroGLYCERIN 0.4 MG SL tablet Commonly known as:  NITROSTAT Place 1 tablet (0.4 mg total) under the tongue every 5 (five) minutes as needed for chest pain (3 doses only).   omeprazole 20 MG capsule Commonly known as:  PRILOSEC Take 20 mg by mouth daily.   polyethylene glycol packet Commonly known as:  MIRALAX / GLYCOLAX Take 17 g by mouth daily.   potassium chloride SA 20 MEQ tablet Commonly known as:  K-DUR,KLOR-CON Take 20 mEq by mouth daily.   ranitidine 150 MG/10ML syrup Commonly known as:  ZANTAC Take 10 mLs (150 mg total) by mouth 2 (two) times daily.   simethicone 80 MG chewable tablet Commonly known as:  MYLICON Chew 1 tablet (80 mg total) by  mouth 4 (four) times daily as needed for flatulence.   tamsulosin 0.4 MG Caps capsule Commonly known as:  FLOMAX Take 1 capsule (0.4 mg total) by mouth daily after supper.   vitamin B-12 1000 MCG tablet Commonly known as:  CYANOCOBALAMIN Take 1,000 mcg by mouth daily.   vitamin C 500 MG tablet Commonly known as:  ASCORBIC ACID Take 500 mg by mouth daily.   Vitamin D-3 25 MCG (1000 UT) Caps Take 1,000 Units by mouth daily.   vitamin E 400 UNIT capsule Take 400 Units by mouth daily.        DISCHARGE INSTRUCTIONS:  See AVS. If you  experience worsening of your admission symptoms, develop shortness of breath, life threatening emergency, suicidal or homicidal thoughts you must seek medical attention immediately by calling 911 or calling your MD immediately  if symptoms less severe.  You Must read complete instructions/literature along with all the possible adverse reactions/side effects for all the Medicines you take and that have been prescribed to you. Take any new Medicines after you have completely understood and accpet all the possible adverse reactions/side effects.   Please note  You were cared for by a hospitalist during your hospital stay. If you have any questions about your discharge medications or the care you received while you were in the hospital after you are discharged, you can call the unit and asked to speak with the hospitalist on call if the hospitalist that took care of you is not available. Once you are discharged, your primary care physician will handle any further medical issues. Please note that NO REFILLS for any discharge medications will be authorized once you are discharged, as it is imperative that you return to your primary care physician (or establish a relationship with a primary care physician if you do not have one) for your aftercare needs so that they can reassess your need for medications and monitor your lab values.    On the day of Discharge:    VITAL SIGNS:  Blood pressure (!) 147/90, pulse 78, temperature 97.6 F (36.4 C), temperature source Oral, resp. rate 18, height _0  (1.727 m), weight 71.3 kg, SpO2 95 %. PHYSICAL EXAMINATION:  GENERAL:  85 y.o.-year-old patient lying in the bed with no acute distress.  EYES: Pupils equal, round, reactive to light and accommodation. No scleral icterus. Extraocular muscles intact.  HEENT: Head atraumatic, normocephalic. Oropharynx and nasopharynx clear.  NECK:  Supple, no jugular venous distention. No thyroid enlargement, no tenderness.  LUNGS: Normal breath sounds bilaterally, no wheezing, rales,rhonchi or crepitation. No use of accessory muscles of respiration.  CARDIOVASCULAR: S1, S2 normal. No murmurs, rubs, or gallops.  ABDOMEN: Soft, non-tender, non-distended. Bowel sounds present. No organomegaly or mass.  EXTREMITIES: No pedal edema, cyanosis, or clubbing.  NEUROLOGIC: Cranial nerves II through XII are intact. Muscle strength 4/5 in all extremities. Sensation intact. Gait not checked.  PSYCHIATRIC: The patient is demented. SKIN: No obvious rash, lesion, or ulcer.  DATA REVIEW:   CBC Recent Labs  Lab 02/18/18 0508  WBC 9.4  HGB 10.2*  HCT 30.9*  PLT 262    Chemistries  Recent Labs  Lab 02/18/18 0508  NA 139  K 3.7  CL 108  CO2 25  GLUCOSE 96  BUN 19  CREATININE 0.51  CALCIUM 8.5*     Microbiology Results  Results for orders placed or performed during the hospital encounter of 02/17/18  Urine Culture     Status: Abnormal   Collection Time: 02/17/18  2:27 PM  Result Value Ref Range Status   Specimen Description   Final    URINE, CATHETERIZED Performed at [facility], [address], [place], [place] [address]    Special Requests   Final    Normal Performed at [facility], [address] [place], [place] [address]    Culture >=100,000 COLONIES/mL KLEBSIELLA PNEUMONIAE (A)  Final   Report Status 02/20/2018 FINAL  Final   Organism  ID, Bacteria KLEBSIELLA PNEUMONIAE (A)  Final      Susceptibility   Klebsiella pneumoniae - MIC*    AMPICILLIN RESISTANT Resistant     CEFAZOLIN <=4  SENSITIVE Sensitive     CEFTRIAXONE <=1 SENSITIVE Sensitive     CIPROFLOXACIN <=0.25 SENSITIVE Sensitive     GENTAMICIN <=1 SENSITIVE Sensitive     IMIPENEM <=0.25 SENSITIVE Sensitive     NITROFURANTOIN 64 INTERMEDIATE Intermediate     TRIMETH/SULFA <=20 SENSITIVE Sensitive     AMPICILLIN/SULBACTAM 4 SENSITIVE Sensitive     PIP/TAZO <=4 SENSITIVE Sensitive     Extended ESBL NEGATIVE Sensitive     * >=100,000 COLONIES/mL KLEBSIELLA PNEUMONIAE    RADIOLOGY:  No results found.   Management plans discussed with the patient, family and they are in agreement.  CODE STATUS: DNR   TOTAL TIME TAKING CARE OF THIS PATIENT: 32 minutes.    Demetrios Loll M.D on 02/20/2018 at 10:22 AM  Between 7am to 6pm - Pager - [phone]  After 6pm go to [email or web address] - Proofreader  [facility]  Office  [phone]  CC: Primary care physician; Merrilee Seashore, MD   Note: This dictation was prepared with Dragon dictation along with smaller phrase technology. Any transcriptional errors that result from this process are unintentional.

## 2018-02-20 NOTE — Discharge Instructions (Signed)
-   Fall precaution 

## 2018-02-20 NOTE — NC FL2 (Signed)
[place] LEVEL OF CARE SCREENING TOOL     IDENTIFICATION  Patient Name: [name] Birthdate: May 06, 1926 Sex: female Admission Date (Current Location): 02/17/2018  [place] and [place] Number:  Engineering geologist and Address:  [facility], [address] [place], [place] [address]      Provider Number: [id number]  Attending Physician Name and Address:  Demetrios Loll, MD  Relative Name and Phone Number:  Cleaster Corin (daughter) [phone]    Current Level of Care: Hospital Recommended Level of Care: [place] Prior Approval Number:    Date Approved/Denied:   PASRR Number: [id number]  Discharge Plan: SNF    Current Diagnoses: Patient Active Problem List   Diagnosis Date Noted  . Recurrent UTI--E coli/Pseudomonas 01/18/2018  . Orthostasis 01/18/2018  . Benign essential HTN   . Cerebral edema (HCC)   . Hypomagnesemia   . Dysphagia, post-stroke   . Lethargy   . Acute blood loss anemia   . Hypoalbuminemia due to protein-calorie malnutrition ([place])   . History of Barrett's esophagus   . Urinary retention   . Seizure prophylaxis   . E. coli UTI   . Intraventricular hemorrhage ([place])   . Urinary tract infection without hematuria   . Coronary artery disease involving native coronary artery of native heart without angina pectoris   . Pulmonary HTN ([place])   . Leukocytosis   . Intracranial hemorrhage ([place]) 12/18/2017  . Syncope 04/10/2015  . Sepsis secondary to UTI ([place]) 04/10/2015  . URI (upper respiratory infection) 04/10/2015  . UTI (lower urinary tract infection) 04/10/2015  . QT prolongation 10/12/2014  . Headache 09/02/2012  . Tricuspid valve regurgitation, moderate 08/18/2012  . Hypokalemia 08/16/2012  . Hyponatremia 08/16/2012  . Atrial fibrillation ([place])   . Hypertension   . Anxiety   . Mixed hyperlipidemia 09/11/2009  . GERD 08/30/2008  . CHEST PAIN 08/30/2008  . Coronary atherosclerosis  04/04/2008  . Syncope and collapse 04/04/2008    Orientation RESPIRATION BLADDER Height & Weight     Self, Place  Normal Incontinent Weight: 157 lb 3.2 oz (71.3 kg) Height:  5\' 8"  (172.7 cm)  BEHAVIORAL SYMPTOMS/MOOD NEUROLOGICAL BOWEL NUTRITION STATUS      Continent Diet(Dysphagia 3 with feeding assistance and supervision during meals)  AMBULATORY STATUS COMMUNICATION OF NEEDS Skin   Extensive Assist Verbally Normal                       Personal Care Assistance Level of Assistance  Bathing, Feeding, Dressing Bathing Assistance: Maximum assistance Feeding assistance: Limited assistance Dressing Assistance: Maximum assistance     Functional Limitations Info  Sight, Hearing, Speech Sight Info: Adequate Hearing Info: Adequate Speech Info: Adequate    SPECIAL CARE FACTORS FREQUENCY  PT (By licensed PT), OT (By licensed OT)     PT Frequency: Up to 5X per week OT Frequency: Up to 3X per week            Contractures Contractures Info: Not present    Additional Factors Info  Code Status, Allergies Code Status Info: DNR Allergies Info: Amlodipine, Methyldopa, Actonel Risedronate Sodium, Ciprofloxacin Hcl, Hctz Hydrochlorothiazide, Hydralazine Hcl, Lactose Intolerance (Gi), Lisinopril, Milk-related Compounds, Oxycodone Hcl, Spironolactone, Tape, Valsartan           Current Medications (02/20/2018):  This is the current hospital active medication list Current Facility-Administered Medications  Medication Dose Route Frequency Provider Last Rate Last Dose  . acetaminophen (TYLENOL) tablet 650 mg  650 mg Oral Q6H PRN Saundra Shelling, MD       Or  . acetaminophen (TYLENOL) suppository 650 mg  650 mg Rectal Q6H PRN Saundra Shelling, MD      . calcium carbonate (TUMS - dosed in mg elemental calcium) chewable tablet 600 mg of elemental calcium  3 tablet Oral Q breakfast Pyreddy, Pavan, MD   600 mg of elemental calcium at 02/20/18 1008  . cephALEXin (KEFLEX) capsule 500 mg  500  mg Oral Q12H Demetrios Loll, MD   500 mg at 02/20/18 1009  . cholecalciferol (VITAMIN D) tablet 1,000 Units  1,000 Units Oral Daily Saundra Shelling, MD   1,000 Units at 02/20/18 1007  . docusate sodium (COLACE) capsule 100 mg  100 mg Oral BID Saundra Shelling, MD   100 mg at 02/20/18 1008  . feeding supplement (ENSURE ENLIVE) (ENSURE ENLIVE) liquid 237 mL  237 mL Oral BID BM Demetrios Loll, MD   237 mL at 02/19/18 1421  . feeding supplement (PRO-STAT SUGAR FREE 64) liquid 30 mL  30 mL Oral BID Demetrios Loll, MD   30 mL at 02/19/18 0910  . irbesartan (AVAPRO) tablet 150 mg  150 mg Oral Daily Pyreddy, Reatha Harps, MD   150 mg at 02/20/18 1008  . levETIRAcetam (KEPPRA) tablet 500 mg  500 mg Oral BID Saundra Shelling, MD   500 mg at 02/20/18 1008  . magnesium oxide (MAG-OX) tablet 200 mg  200 mg Oral BID Saundra Shelling, MD   200 mg at 02/20/18 1008  . MEDLINE mouth rinse  15 mL Mouth Rinse BID Pyreddy, Reatha Harps, MD   15 mL at 02/20/18 1009  . metoprolol tartrate (LOPRESSOR) tablet 50 mg  50 mg Oral BID Saundra Shelling, MD   50 mg at 02/20/18 1009  . multivitamin with minerals tablet 1 tablet  1 tablet Oral Daily Saundra Shelling, MD   1 tablet at 02/20/18 1008  . multivitamin-lutein (OCUVITE-LUTEIN) capsule 1 capsule  1 capsule Oral Daily Demetrios Loll, MD   1 capsule at 02/20/18 1008  . nitroGLYCERIN (NITROSTAT) SL tablet 0.4 mg  0.4 mg Sublingual Q5 min PRN Pyreddy, Pavan, MD      . pantoprazole (PROTONIX) EC tablet 40 mg  40 mg Oral Daily Pyreddy, Pavan, MD   40 mg at 02/20/18 1008  . polyethylene glycol (MIRALAX / GLYCOLAX) packet 17 g  17 g Oral Daily Pyreddy, Pavan, MD   17 g at 02/20/18 1008  . polyvinyl alcohol (LIQUIFILM TEARS) 1.4 % ophthalmic solution 1 drop  1 drop Both Eyes PRN Pyreddy, Pavan, MD      . potassium chloride SA (K-DUR,KLOR-CON) CR tablet 20 mEq  20 mEq Oral Daily Pyreddy, Pavan, MD   20 mEq at 02/20/18 1008  . simethicone (MYLICON) chewable tablet 80 mg  80 mg Oral QID PRN Saundra Shelling, MD      .  tamsulosin (FLOMAX) capsule 0.4 mg  0.4 mg Oral QPC supper Saundra Shelling, MD   0.4 mg at 02/19/18 1724  . vitamin B-12 (CYANOCOBALAMIN) tablet 1,000 mcg  1,000 mcg Oral Daily Pyreddy, Reatha Harps, MD   1,000 mcg at 02/20/18 1008  . vitamin C (ASCORBIC ACID) tablet 500 mg  500 mg Oral Daily Pyreddy, Pavan, MD   500 mg at 02/20/18 1008  . vitamin E capsule 400 Units  400 Units Oral Daily Saundra Shelling, MD   400 Units at 02/20/18 1007     Discharge Medications: Please see discharge summary for a list of discharge medications.  Relevant Imaging Results:  Relevant Lab Results:   Additional Information [phone]  Zettie Pho, LCSW

## 2018-02-20 NOTE — Clinical Social Work Note (Signed)
Clinical Social Work Assessment  Patient Details  Name: [name] MRN: [id number] Date of Birth: [birth year]  Date of referral:  02/20/18               Reason for consult:  Facility Placement                Permission sought to share information with:  Chartered certified accountant granted to share information::  Yes, Verbal Permission Granted  Name::        Agency::  [facility]  Relationship::     Contact Information:     Housing/Transportation Living arrangements for the past 2 months:  [place] of Information:  Medical Team, Adult Children, Facility Patient Interpreter Needed:  None Criminal Activity/Legal Involvement Pertinent to Current Situation/Hospitalization:  No - Comment as needed Significant Relationships:  Adult Children, Warehouse manager Lives with:  Facility Resident Do you feel safe going back to the place where you live?  Yes Need for family participation in patient care:  Yes (Comment)(Patient has dementia and is confused)  Care giving concerns: The patient admitted from a SNF   Social Worker assessment / plan:  The CSW spoke with the patient's daughter to discuss discharge planning and imminent discharge for today. The patient's daughter gave verbal permission to contact [facility] and agreed with discharge via EMS to the facility. The patient's daughter requested that the attending RN contact her when EMS arrives at [facility].  The CSW contacted Olivia Mackie at [facility] who confirmed that the patient can return. The CSW provided the discharge documentation to [place] via the [place].  The CSW delivered the discharge packet to the chart including MOST and DNR forms. The patient's attending RN mentioned that the patient may not have had a bowel movement during her admission. The CSW advised that best practice is that the patient have a BM prior to discharge to ensure no bowel obstruction. The CSW advised the attending RN to consult  with the attending MD to ensure that he is aware of this potential discharge barrier.  The CSW is signing off. Please consult should needs arise.  Employment status:  Retired Nurse, adult PT Recommendations:  [place] / Referral to community resources:     Patient/Family's Response to care: The patient's family thanked the CSW.  Patient/Family's Understanding of and Emotional Response to Diagnosis, Current Treatment, and Prognosis:  The patient's daughter understands the discharge plan and is in agreement.  Emotional Assessment Appearance:  Appears stated age Attitude/Demeanor/Rapport:  Unresponsive Affect (typically observed):  Flat Orientation:  Oriented to Self Alcohol / Substance use:  Never Used Psych involvement (Current and /or in the community):  No (Comment)  Discharge Needs  Concerns to be addressed:  Discharge Planning Concerns, Care Coordination Readmission within the last 30 days:  No Current discharge risk:  Chronically ill, Cognitively Impaired, Physical Impairment Barriers to Discharge:  No Barriers Identified   Zettie Pho, LCSW 02/20/2018, 10:51 AM

## 2018-02-22 DIAGNOSIS — R55 Syncope and collapse: Secondary | ICD-10-CM | POA: Diagnosis not present

## 2018-02-22 DIAGNOSIS — R600 Localized edema: Secondary | ICD-10-CM | POA: Diagnosis not present

## 2018-02-22 DIAGNOSIS — I615 Nontraumatic intracerebral hemorrhage, intraventricular: Secondary | ICD-10-CM | POA: Diagnosis not present

## 2018-02-22 DIAGNOSIS — N390 Urinary tract infection, site not specified: Secondary | ICD-10-CM | POA: Diagnosis not present

## 2018-02-23 DIAGNOSIS — G4700 Insomnia, unspecified: Secondary | ICD-10-CM | POA: Diagnosis not present

## 2018-02-23 DIAGNOSIS — F321 Major depressive disorder, single episode, moderate: Secondary | ICD-10-CM | POA: Diagnosis not present

## 2018-02-24 DIAGNOSIS — N390 Urinary tract infection, site not specified: Secondary | ICD-10-CM | POA: Diagnosis not present

## 2018-02-24 DIAGNOSIS — R109 Unspecified abdominal pain: Secondary | ICD-10-CM | POA: Diagnosis not present

## 2018-02-24 DIAGNOSIS — R300 Dysuria: Secondary | ICD-10-CM | POA: Diagnosis not present

## 2018-02-24 DIAGNOSIS — E559 Vitamin D deficiency, unspecified: Secondary | ICD-10-CM | POA: Diagnosis not present

## 2018-03-09 DIAGNOSIS — F321 Major depressive disorder, single episode, moderate: Secondary | ICD-10-CM | POA: Diagnosis not present

## 2018-03-09 DIAGNOSIS — G4700 Insomnia, unspecified: Secondary | ICD-10-CM | POA: Diagnosis not present

## 2018-03-12 ENCOUNTER — Emergency Department (HOSPITAL_COMMUNITY): Payer: Medicare Other

## 2018-03-12 ENCOUNTER — Emergency Department (HOSPITAL_COMMUNITY)
Admission: EM | Admit: 2018-03-12 | Discharge: 2018-03-12 | Disposition: A | Discharge status: Home / Self Care | Payer: Medicare Other | Attending: Emergency Medicine | Admitting: Emergency Medicine

## 2018-03-12 ENCOUNTER — Encounter (HOSPITAL_COMMUNITY): Payer: Self-pay

## 2018-03-12 DIAGNOSIS — R42 Dizziness and giddiness: Secondary | ICD-10-CM | POA: Diagnosis not present

## 2018-03-12 DIAGNOSIS — Y9389 Activity, other specified: Secondary | ICD-10-CM | POA: Insufficient documentation

## 2018-03-12 DIAGNOSIS — Z79899 Other long term (current) drug therapy: Secondary | ICD-10-CM | POA: Insufficient documentation

## 2018-03-12 DIAGNOSIS — S51002A Unspecified open wound of left elbow, initial encounter: Secondary | ICD-10-CM | POA: Diagnosis not present

## 2018-03-12 DIAGNOSIS — Z23 Encounter for immunization: Secondary | ICD-10-CM | POA: Insufficient documentation

## 2018-03-12 DIAGNOSIS — Z87891 Personal history of nicotine dependence: Secondary | ICD-10-CM | POA: Insufficient documentation

## 2018-03-12 DIAGNOSIS — S4992XA Unspecified injury of left shoulder and upper arm, initial encounter: Secondary | ICD-10-CM | POA: Diagnosis not present

## 2018-03-12 DIAGNOSIS — S79912A Unspecified injury of left hip, initial encounter: Secondary | ICD-10-CM | POA: Diagnosis not present

## 2018-03-12 DIAGNOSIS — Z9861 Coronary angioplasty status: Secondary | ICD-10-CM | POA: Insufficient documentation

## 2018-03-12 DIAGNOSIS — S01112A Laceration without foreign body of left eyelid and periocular area, initial encounter: Secondary | ICD-10-CM | POA: Insufficient documentation

## 2018-03-12 DIAGNOSIS — R079 Chest pain, unspecified: Secondary | ICD-10-CM | POA: Diagnosis not present

## 2018-03-12 DIAGNOSIS — Y998 Other external cause status: Secondary | ICD-10-CM | POA: Insufficient documentation

## 2018-03-12 DIAGNOSIS — T07XXXA Unspecified multiple injuries, initial encounter: Secondary | ICD-10-CM

## 2018-03-12 DIAGNOSIS — S8002XA Contusion of left knee, initial encounter: Secondary | ICD-10-CM | POA: Insufficient documentation

## 2018-03-12 DIAGNOSIS — N3000 Acute cystitis without hematuria: Secondary | ICD-10-CM

## 2018-03-12 DIAGNOSIS — S40012A Contusion of left shoulder, initial encounter: Secondary | ICD-10-CM | POA: Diagnosis not present

## 2018-03-12 DIAGNOSIS — M25552 Pain in left hip: Secondary | ICD-10-CM | POA: Diagnosis not present

## 2018-03-12 DIAGNOSIS — Y92122 Bedroom in nursing home as the place of occurrence of the external cause: Secondary | ICD-10-CM | POA: Insufficient documentation

## 2018-03-12 DIAGNOSIS — W06XXXA Fall from bed, initial encounter: Secondary | ICD-10-CM | POA: Insufficient documentation

## 2018-03-12 DIAGNOSIS — M25562 Pain in left knee: Secondary | ICD-10-CM | POA: Diagnosis not present

## 2018-03-12 DIAGNOSIS — W19XXXA Unspecified fall, initial encounter: Secondary | ICD-10-CM

## 2018-03-12 DIAGNOSIS — F419 Anxiety disorder, unspecified: Secondary | ICD-10-CM | POA: Insufficient documentation

## 2018-03-12 DIAGNOSIS — S20219A Contusion of unspecified front wall of thorax, initial encounter: Secondary | ICD-10-CM | POA: Insufficient documentation

## 2018-03-12 DIAGNOSIS — S0990XA Unspecified injury of head, initial encounter: Secondary | ICD-10-CM | POA: Diagnosis not present

## 2018-03-12 DIAGNOSIS — I10 Essential (primary) hypertension: Secondary | ICD-10-CM | POA: Diagnosis not present

## 2018-03-12 DIAGNOSIS — S8992XA Unspecified injury of left lower leg, initial encounter: Secondary | ICD-10-CM | POA: Diagnosis not present

## 2018-03-12 DIAGNOSIS — S199XXA Unspecified injury of neck, initial encounter: Secondary | ICD-10-CM | POA: Diagnosis not present

## 2018-03-12 DIAGNOSIS — S299XXA Unspecified injury of thorax, initial encounter: Secondary | ICD-10-CM | POA: Diagnosis not present

## 2018-03-12 DIAGNOSIS — I2510 Atherosclerotic heart disease of native coronary artery without angina pectoris: Secondary | ICD-10-CM | POA: Diagnosis not present

## 2018-03-12 DIAGNOSIS — S8012XA Contusion of left lower leg, initial encounter: Secondary | ICD-10-CM | POA: Diagnosis not present

## 2018-03-12 DIAGNOSIS — S0181XA Laceration without foreign body of other part of head, initial encounter: Secondary | ICD-10-CM

## 2018-03-12 LAB — URINALYSIS, ROUTINE W REFLEX MICROSCOPIC
Bilirubin Urine: NEGATIVE
Glucose, UA: NEGATIVE mg/dL
Hgb urine dipstick: NEGATIVE
Ketones, ur: NEGATIVE mg/dL
Nitrite: POSITIVE — AB
Protein, ur: NEGATIVE mg/dL
Specific Gravity, Urine: 1.021 (ref 1.005–1.030)
pH: 5 (ref 5.0–8.0)

## 2018-03-12 MED ORDER — CEPHALEXIN 500 MG PO CAPS: 500.0000 mg | ORAL_CAPSULE | Freq: Two times a day (BID) | ORAL | 0 refills | Status: DC

## 2018-03-12 MED ORDER — ACETAMINOPHEN 325 MG PO TABS
650.0000 mg | ORAL_TABLET | Freq: Once | ORAL | Status: AC
  Administered 2018-03-12: 650 mg via ORAL
  Filled 2018-03-12: qty 2

## 2018-03-12 MED ORDER — TETANUS-DIPHTH-ACELL PERTUSSIS 5-2.5-18.5 LF-MCG/0.5 IM SUSP
0.5000 mL | Freq: Once | INTRAMUSCULAR | Status: AC
  Administered 2018-03-12: 0.5 mL via INTRAMUSCULAR
  Filled 2018-03-12: qty 0.5

## 2018-03-12 MED ORDER — LIDOCAINE-EPINEPHRINE (PF) 2 %-1:200000 IJ SOLN
10.0000 mL | Freq: Once | INTRAMUSCULAR | Status: DC
  Filled 2018-03-12: qty 20

## 2018-03-12 NOTE — Discharge Instructions (Signed)
Because you are starting to have burning with urination and you have signs of subtle infection will start Keflex twice a day back.  Urinary culture is pending.  Secondly the stitches you have in your left eyebrow are dissolvable and should be able to be pulled out in approximately 5 to 7 days.  There are 7 stitches present.  You can take Tylenol 1 tablet every 4 hours as needed for diffuse body pain.  You need to allow someone to help you get out of bed and should not try to walk on your own.  Your x-rays did not show any sign of new bleeding in the brain or fracture of bones.

## 2018-03-12 NOTE — ED Notes (Signed)
Patient transported to X-ray 

## 2018-03-12 NOTE — ED Notes (Signed)
Patient verbalizes understanding of discharge instructions. Opportunity for questioning and answers were provided. Armband removed by staff, pt discharged from ED via [name].

## 2018-03-12 NOTE — ED Provider Notes (Signed)
[facility] EMERGENCY DEPARTMENT Provider Note   CSN: [id number] Arrival date & time: 03/12/18  0847     History   Chief Complaint Chief Complaint  Patient presents with  . Fall    HPI [name] is a 85 y.o. female.  Patient is an 85 year old female who is currently at a rehab facility and being brought in today by EMS after falling out of bed.  Patient does have a history of chronic atrial fibrillation, coronary artery disease, hypertension, mitral regurgitation, pulmonary hypertension and chronic dizziness and syncope of unknown origin.  She states that she was sitting on the side of her bed today and she felt fine then she leaned over to put a sock on it when she started feeling dizzy and falling forward.  She could not stop herself from falling and fell face first on the left side into the floor on her shoulder and left knee.  She denies loss of consciousness.  She is complaining of pain over her face and in her shoulder.  Also feels sore in her hip and kind of everywhere.  She denies any shortness of breath or abdominal pain.  She takes no anticoagulation at this time.  The history is provided by the patient and the EMS personnel.  Fall  This is a new problem. The current episode started less than 1 hour ago. The problem occurs constantly. The problem has not changed since onset.   Past Medical History:  Diagnosis Date  . Ankle fracture   . Anxiety   . Arthritis   . Barrett's esophagus   . Chronic atrial fibrillation    a. Pt previously declined DCCV.  Marland Kitchen Coronary artery disease    a. s/p Cypher DES to LAD in 2006;  b. Last LHC 02/2008:  pLAD 30%, mLAD stent patent, pOM 50%, EF 65%;  c. Lexiscan Myoview 7/14:  Intermediate risk, dist ant and inf-lat ischemia, EF 66% - patient declined cath and elected medical therapy.  Marland Kitchen GERD (gastroesophageal reflux disease)   . Hiatal hernia   . Hx of echocardiogram    Echocardiogram 08/17/12: EF 55-60%, MAC, mild  MR, mild LAE, mild RVE, mild to moderate RAE, moderate TR, mildly increased pulmonary artery systolic pressures  . Hyperlipidemia   . Hypertension   . Mitral regurgitation    a. Mod by echo 10/2014.  . Obesity   . Pulmonary hypertension ([place])    a. Mod-severely elevated PA pressures by echo 10/2014.  Marland Kitchen QT prolongation    a. During 10/2014 admit, QTC 552m.  . Tricuspid regurgitation    a. Mod by echo 10/2014.  .Marland KitchenUTI (lower urinary tract infection) 03/2015    Patient Active Problem List   Diagnosis Date Noted  . Recurrent UTI--E coli/Pseudomonas 01/18/2018  . Orthostasis 01/18/2018  . Benign essential HTN   . Cerebral edema (HCC)   . Hypomagnesemia   . Dysphagia, post-stroke   . Lethargy   . Acute blood loss anemia   . Hypoalbuminemia due to protein-calorie malnutrition ([place]   . History of Barrett's esophagus   . Urinary retention   . Seizure prophylaxis   . E. coli UTI   . Intraventricular hemorrhage ([place]   . Urinary tract infection without hematuria   . Coronary artery disease involving native coronary artery of native heart without angina pectoris   . Pulmonary HTN ([place]   . Leukocytosis   . Intracranial hemorrhage ([place] 12/18/2017  . Syncope 04/10/2015  . Sepsis secondary to  UTI ([place]) 04/10/2015  . URI (upper respiratory infection) 04/10/2015  . UTI (lower urinary tract infection) 04/10/2015  . QT prolongation 10/12/2014  . Headache 09/02/2012  . Tricuspid valve regurgitation, moderate 08/18/2012  . Hypokalemia 08/16/2012  . Hyponatremia 08/16/2012  . Atrial fibrillation ([place])   . Hypertension   . Anxiety   . Mixed hyperlipidemia 09/11/2009  . GERD 08/30/2008  . CHEST PAIN 08/30/2008  . Coronary atherosclerosis 04/04/2008  . Syncope and collapse 04/04/2008    Past Surgical History:  Procedure Laterality Date  . ABDOMINAL HYSTERECTOMY    . ANKLE SURGERY  2001  . BREAST EXCISIONAL BIOPSY Right 1970  . BREAST EXCISIONAL BIOPSY Right   . BREAST EXCISIONAL  BIOPSY Left   . CESAREAN SECTION    . CORONARY ANGIOPLASTY    . ESOPHAGOGASTRODUODENOSCOPY       OB History   No obstetric history on file.      Home Medications    Prior to Admission medications   Medication Sig Start Date End Date Taking? Authorizing Provider  acetaminophen (TYLENOL) 325 MG tablet Take 2 tablets (650 mg total) by mouth every 6 (six) hours. 01/18/18   Love, Ivan Anchors, PA-C  antiseptic oral rinse (BIOTENE) LIQD 15 mLs by Mouth Rinse route 2 (two) times daily.    [provider]  carboxymethylcellulose (REFRESH PLUS) 0.5 % SOLN Place 1 drop into both eyes as needed (dry eyes).    [provider]  cephALEXin (KEFLEX) 500 MG capsule Take 1 capsule (500 mg total) by mouth every 12 (twelve) hours. 02/20/18   Demetrios Loll, MD  Cholecalciferol (VITAMIN D-3) 1000 UNITS CAPS Take 1,000 Units by mouth daily.     [provider]  docusate sodium (COLACE) 100 MG capsule Take 1 capsule (100 mg total) by mouth 2 (two) times daily. 01/18/18   Love, Ivan Anchors, PA-C  irbesartan (AVAPRO) 150 MG tablet Take 1 tablet (150 mg total) by mouth daily. 01/19/18   Love, Ivan Anchors, PA-C  levETIRAcetam (KEPPRA) 500 MG tablet Take 1 tablet (500 mg total) by mouth 2 (two) times daily. 12/23/17   Costella, Vista Mink, PA-C  magnesium oxide (MAG-OX) 400 (241.3 Mg) MG tablet Take 0.5 tablets (200 mg total) by mouth 2 (two) times daily. 01/18/18   Love, Ivan Anchors, PA-C  Menthol-Methyl Salicylate (MUSCLE RUB) 10-15 % CREA Apply 1 application topically every 6 (six) hours. To bilateral knees 01/18/18   Love, Ivan Anchors, PA-C  metoprolol tartrate (LOPRESSOR) 50 MG tablet Take 1 tablet (50 mg total) by mouth 2 (two) times daily. 01/18/18   Love, Ivan Anchors, PA-C  mouth rinse LIQD solution 15 mLs by Mouth Rinse route 2 (two) times daily. Patient not taking: Reported on 02/19/2018 01/18/18   Love, Ivan Anchors, PA-C  Multiple Vitamin (MULTIVITAMIN WITH MINERALS) TABS tablet Take 1 tablet by mouth  daily. Patient not taking: Reported on 02/19/2018 01/19/18   Love, Ivan Anchors, PA-C  nitroGLYCERIN (NITROSTAT) 0.4 MG SL tablet Place 1 tablet (0.4 mg total) under the tongue every 5 (five) minutes as needed for chest pain (3 doses only). 01/18/18   Love, Ivan Anchors, PA-C  omeprazole (PRILOSEC) 20 MG capsule Take 20 mg by mouth daily.    [provider]  polyethylene glycol (MIRALAX / GLYCOLAX) packet Take 17 g by mouth daily. 01/19/18   Love, Ivan Anchors, PA-C  potassium chloride SA (K-DUR,KLOR-CON) 20 MEQ tablet Take 20 mEq by mouth daily.     [provider]  ranitidine (ZANTAC)  150 MG/10ML syrup Take 10 mLs (150 mg total) by mouth 2 (two) times daily. 01/18/18   Love, Ivan Anchors, PA-C  simethicone (MYLICON) 80 MG chewable tablet Chew 1 tablet (80 mg total) by mouth 4 (four) times daily as needed for flatulence. 01/18/18   Love, Ivan Anchors, PA-C  tamsulosin (FLOMAX) 0.4 MG CAPS capsule Take 1 capsule (0.4 mg total) by mouth daily after supper. 01/18/18   Love, Ivan Anchors, PA-C  vitamin B-12 (CYANOCOBALAMIN) 1000 MCG tablet Take 1,000 mcg by mouth daily.    [provider]  vitamin C (ASCORBIC ACID) 500 MG tablet Take 500 mg by mouth daily.    [provider]  vitamin E 400 UNIT capsule Take 400 Units by mouth daily.    [provider]    Family History Family History  Problem Relation Age of Onset  . Diabetes Mother   . Heart attack Mother   . Lung cancer Father   . Thyroid disease Sister   . Thyroid disease Sister   . Hypertension Sister   . Hypertension Sister   . Hypertension Sister   . Hypertension Sister   . Hypertension Brother   . Hypertension Brother   . Breast cancer Other 45  . Colon cancer Neg Hx   . Colon polyps Neg Hx   . Esophageal cancer Neg Hx   . Stroke Neg Hx     Social History Social History   Tobacco Use  . Smoking status: Former Smoker    Types: Cigarettes  . Smokeless tobacco: Never Used  Substance Use Topics  . Alcohol use:  No    Alcohol/week: 0.0 standard drinks  . Drug use: No     Allergies   Amlodipine; Methyldopa; Actonel [risedronate sodium]; Ciprofloxacin hcl; Hctz [hydrochlorothiazide]; Hydralazine hcl; Lactose intolerance (gi); Lisinopril; Milk-related compounds; Oxycodone hcl; Spironolactone; Tape; and Valsartan   Review of Systems Review of Systems  All other systems reviewed and are negative.    Physical Exam Updated Vital Signs BP (!) 163/70   Pulse 74   Resp 12   SpO2 99%   Physical Exam Vitals signs and nursing note reviewed.  Constitutional:      General: She is not in acute distress.    Appearance: She is well-developed.  HENT:     Head: Normocephalic.   Eyes:     Pupils: Pupils are equal, round, and reactive to light.  Neck:     Musculoskeletal: Neck supple. Muscular tenderness present.  Cardiovascular:     Rate and Rhythm: Normal rate and regular rhythm.     Heart sounds: Murmur present. No friction rub.  Pulmonary:     Effort: Pulmonary effort is normal.     Breath sounds: Normal breath sounds. No wheezing or rales.     Comments: Mild anterior chest tenderness but no localized tenderness Chest:     Chest wall: Tenderness present.  Abdominal:     General: Bowel sounds are normal. There is no distension.     Palpations: Abdomen is soft.     Tenderness: There is no abdominal tenderness. There is no guarding or rebound.  Musculoskeletal: Normal range of motion.        General: Tenderness present.     Left hip: She exhibits bony tenderness. She exhibits normal strength.       Arms:       Legs:       Feet:     Comments: No edema  Skin:    General: Skin  is warm and dry.     Capillary Refill: Capillary refill takes 2 to 3 seconds.     Findings: No rash.  Neurological:     Mental Status: She is alert and oriented to person, place, and time.     Cranial Nerves: No cranial nerve deficit.     Comments: Patient is able to lift both arms without difficulty.  She can  lift each leg off the bed independently.  Psychiatric:        Behavior: Behavior normal.      ED Treatments / Results  Labs (all labs ordered are listed, but only abnormal results are displayed) Labs Reviewed  URINALYSIS, ROUTINE W REFLEX MICROSCOPIC - Abnormal; Notable for the following components:      Result Value   Nitrite POSITIVE (*)    Leukocytes, UA TRACE (*)    Bacteria, UA MANY (*)    All other components within normal limits  URINE CULTURE    EKG EKG Interpretation  Date/Time:  Friday March 12 2018 08:55:33 EST Ventricular Rate:  71 PR Interval:    QRS Duration: 100 QT Interval:  392 QTC Calculation: 426 R Axis:   -42 Text Interpretation:  Atrial fibrillation Ventricular premature complex Left axis deviation Posterior infarct, old Probable anteroseptal infarct, old Minimal ST depression, diffuse leads No significant change since last tracing Confirmed by Blanchie Dessert [phone]) on 03/12/2018 9:00:21 AM   Radiology Dg Chest 2 View  Result Date: 03/12/2018 CLINICAL DATA:  Status post fall with chest pain. EXAM: CHEST - 2 VIEW COMPARISON:  December 25, 2017 FINDINGS: The heart size and mediastinal contours are within normal limits. There is no focal infiltrate, pulmonary edema, or pleural effusion. Left lateral mid lung calcified granuloma is unchanged. The visualized skeletal structures are unremarkable. IMPRESSION: No active cardiopulmonary disease. Electronically Signed   By: Abelardo Diesel M.D.   On: 03/12/2018 10:25   Dg Knee 2 Views Left  Result Date: 03/12/2018 CLINICAL DATA:  Status post fall with left knee pain. EXAM: LEFT KNEE - 1-2 VIEW COMPARISON:  None. FINDINGS: No evidence of fracture, dislocation, or joint effusion. There are degenerative joint changes with narrowed joint space and osteophyte formation. Chondrocalcinosis is identified in the femoral tibial joint space. Soft tissues are unremarkable. IMPRESSION: No acute fracture or dislocation  identified. Electronically Signed   By: Abelardo Diesel M.D.   On: 03/12/2018 10:24   Ct Head Wo Contrast  Result Date: 03/12/2018 CLINICAL DATA:  Fall.  Dizziness.  Laceration under left eye. EXAM: CT HEAD WITHOUT CONTRAST CT CERVICAL SPINE WITHOUT CONTRAST TECHNIQUE: Multidetector CT imaging of the head and cervical spine was performed following the standard protocol without intravenous contrast. Multiplanar CT image reconstructions of the cervical spine were also generated. COMPARISON:  02/17/2018 FINDINGS: CT HEAD FINDINGS Brain: Hemorrhage in the temporal horn of the right lateral ventricle is evolving and less prominent. White matter atrophy in the right temporal lobe is stable. No new intraventricular or parenchymal hemorrhage. Global atrophy and chronic ischemic changes in the periventricular white matter are again noted. There is no mass effect or midline shift. Vascular: No hyperdense vessel or unexpected calcification. Skull: Cranium is intact. Sinuses/Orbits: Visualized paranasal sinuses and mastoid air cells are clear. Orbits are within normal limits. Other: Soft tissue injury over the left frontal bone is noted. CT CERVICAL SPINE FINDINGS Alignment: Anatomic Skull base and vertebrae: No acute fracture.  No dislocation. Soft tissues and spinal canal: No obvious spinal hematoma. No evidence of prevertebral edema.  Carotid atherosclerotic calcifications predominately on the left are noted. Thyroid is heterogeneous without focal mass. Disc levels: Degenerative changes at the articulation between the odontoid and C1 anterior arch are noted. There is severe disc space narrowing at C5-6. Moderate narrowing at C4-5 and C6-7. There is posterior osteophytic ridging at these levels. Scattered uncovertebral osteophytes are seen. Upper chest: Negative. Other: Noncontributory IMPRESSION: Right temporal lobe and ventricular hemorrhage are evolving. No new parenchymal or intraventricular hemorrhage. Global atrophy  and chronic ischemic changes are superimposed. No evidence of cervical spine injury. Electronically Signed   By: Marybelle Killings M.D.   On: 03/12/2018 11:25   Ct Cervical Spine Wo Contrast  Result Date: 03/12/2018 CLINICAL DATA:  Fall.  Dizziness.  Laceration under left eye. EXAM: CT HEAD WITHOUT CONTRAST CT CERVICAL SPINE WITHOUT CONTRAST TECHNIQUE: Multidetector CT imaging of the head and cervical spine was performed following the standard protocol without intravenous contrast. Multiplanar CT image reconstructions of the cervical spine were also generated. COMPARISON:  02/17/2018 FINDINGS: CT HEAD FINDINGS Brain: Hemorrhage in the temporal horn of the right lateral ventricle is evolving and less prominent. White matter atrophy in the right temporal lobe is stable. No new intraventricular or parenchymal hemorrhage. Global atrophy and chronic ischemic changes in the periventricular white matter are again noted. There is no mass effect or midline shift. Vascular: No hyperdense vessel or unexpected calcification. Skull: Cranium is intact. Sinuses/Orbits: Visualized paranasal sinuses and mastoid air cells are clear. Orbits are within normal limits. Other: Soft tissue injury over the left frontal bone is noted. CT CERVICAL SPINE FINDINGS Alignment: Anatomic Skull base and vertebrae: No acute fracture.  No dislocation. Soft tissues and spinal canal: No obvious spinal hematoma. No evidence of prevertebral edema. Carotid atherosclerotic calcifications predominately on the left are noted. Thyroid is heterogeneous without focal mass. Disc levels: Degenerative changes at the articulation between the odontoid and C1 anterior arch are noted. There is severe disc space narrowing at C5-6. Moderate narrowing at C4-5 and C6-7. There is posterior osteophytic ridging at these levels. Scattered uncovertebral osteophytes are seen. Upper chest: Negative. Other: Noncontributory IMPRESSION: Right temporal lobe and ventricular hemorrhage  are evolving. No new parenchymal or intraventricular hemorrhage. Global atrophy and chronic ischemic changes are superimposed. No evidence of cervical spine injury. Electronically Signed   By: Marybelle Killings M.D.   On: 03/12/2018 11:25   Dg Shoulder Left  Result Date: 03/12/2018 CLINICAL DATA:  Fall from bed. EXAM: LEFT SHOULDER - 2+ VIEW COMPARISON:  None. FINDINGS: Glenohumeral joint is intact. No evidence of scapular fracture or humeral fracture. The acromioclavicular joint is intact. IMPRESSION: No fracture or dislocation. Electronically Signed   By: Suzy Bouchard M.D.   On: 03/12/2018 10:27   Dg Hip Unilat W Or Wo Pelvis 2-3 Views Left  Result Date: 03/12/2018 CLINICAL DATA:  Status post fall with left hip pain. EXAM: DG HIP (WITH OR WITHOUT PELVIS) 2-3V LEFT COMPARISON:  None. FINDINGS: There is no evidence of hip fracture or dislocation. Degenerative joint changes of the lower lumbar spine are noted. There are degenerative joint changes of bilateral hips with narrowed joint space and osteophyte formation. IMPRESSION: No acute fracture or dislocation of the left hip. Electronically Signed   By: Abelardo Diesel M.D.   On: 03/12/2018 10:23    Procedures Procedures (including critical care time)  LACERATION REPAIR Performed by: [employer] Authorized by: Blanchie Dessert Consent: Verbal consent obtained. Risks and benefits: risks, benefits and alternatives were discussed Consent given by: patient Patient  identity confirmed: provided demographic data Prepped and Draped in normal sterile fashion Wound explored  Laceration Location: left eyebrow  Laceration Length: 3cm  No Foreign Bodies seen or palpated  Anesthesia: local infiltration  Local anesthetic: lidocaine 2% with epinephrine  Anesthetic total: 3 ml  Irrigation method: syringe Amount of cleaning: standard  Skin closure: 5.0 fast absorbing gut  Number of sutures: 7  Technique: simple interrupted  Patient  tolerance: Patient tolerated the procedure well with no immediate complications.   Medications Ordered in ED Medications - No data to display   Initial Impression / Assessment and Plan / ED Course  I have reviewed the triage vital signs and the nursing notes.  Pertinent labs & imaging results that were available during my care of the patient were reviewed by me and considered in my medical decision making (see chart for details).     Patient from a fall today from her bed with injury to the left side of the face, left shoulder and left leg.  She said when she leaned over she felt slightly dizzy which is not unusual for her.  She takes no anticoagulation at this time.  Head CT and cervical films are pending as she did have a headache and neck pain.  Plain films of the shoulder knee and hip also pending.  She has no shortening or deformity of her hip concerning for femur fracture.  Patient was given Tylenol for pain.  Wound repair as above.  Tetanus was updated.   12:22 PM X-ray without acute injury at this time.  Wound repair as above.  Patient did tell the nurse she was having dysuria and was concerned she may be developing urinary tract infection.  She was last treated the beginning of January for pseudomonal UTI sensitive to Keflex.  Patient states she is starting to have the burning again.  Nitrite and leukocyte positive with 6-10 white cells and many bacteria.  Urine culture was done but since patient is feeling symptomatic we will restart Keflex. Final Clinical Impressions(s) / ED Diagnoses   Final diagnoses:  Fall, initial encounter  Facial laceration, initial encounter  Multiple contusions  Avulsion of skin of left elbow, initial encounter  Acute cystitis without hematuria    ED Discharge Orders         Ordered    cephALEXin (KEFLEX) 500 MG capsule  2 times daily     03/12/18 1239           Blanchie Dessert, MD 03/12/18 1239

## 2018-03-12 NOTE — ED Triage Notes (Signed)
Pt arrives by [facility] from [place] after an unwitnessed fall. Pt suffered lacs to face; bleeding controlled by EMS. Pt was sitting on side of bed and became dizzy and fell. Pt c/o bilateral hip pain and left eye pain. Pt a/o to baseline per EMS. VSS at this time.  EMS vitals:  BP 140/82 HR 80 (known hx of afib)

## 2018-03-12 NOTE — ED Notes (Signed)
[facility] called  

## 2018-03-15 DIAGNOSIS — S01112A Laceration without foreign body of left eyelid and periocular area, initial encounter: Secondary | ICD-10-CM | POA: Diagnosis not present

## 2018-03-15 DIAGNOSIS — N390 Urinary tract infection, site not specified: Secondary | ICD-10-CM | POA: Diagnosis not present

## 2018-03-15 DIAGNOSIS — R300 Dysuria: Secondary | ICD-10-CM | POA: Diagnosis not present

## 2018-03-15 DIAGNOSIS — R109 Unspecified abdominal pain: Secondary | ICD-10-CM | POA: Diagnosis not present

## 2018-03-15 LAB — URINE CULTURE: Culture: >=100000 — AB

## 2018-03-16 ENCOUNTER — Telehealth: Payer: Self-pay | Admitting: Emergency Medicine

## 2018-03-16 NOTE — Telephone Encounter (Signed)
Post ED Visit - Positive Culture Follow-up  Culture report reviewed by antimicrobial stewardship pharmacist:  []  Elenor Quinones, Pharm.D. []  Heide Guile, Pharm.D., BCPS AQ-ID []  Parks Neptune, Pharm.D., BCPS []  Alycia Rossetti, Pharm.D., BCPS []  [place], Pharm.D., BCPS, AAHIVP []  Legrand Como, Pharm.D., BCPS, AAHIVP [x]  Salome Arnt, PharmD, BCPS []  Johnnette Gourd, PharmD, BCPS []  Hughes Better, PharmD, BCPS []  Leeroy Cha, PharmD  Positive urine culture Treated with cephalexin, organism sensitive to the same and no further patient follow-up is required at this time.  Hazle Nordmann 03/16/2018, 11:56 AM

## 2018-03-19 ENCOUNTER — Other Ambulatory Visit: Payer: Self-pay | Admitting: *Deleted

## 2018-03-19 NOTE — Patient Outreach (Signed)
[facility]) Care Management  03/19/2018  HAANI BAKULA 1926-03-26 [id number]   Attended interdisciplinary team meeting at [facility] and [place] with discharge [place] to discuss patient's progress and plan for transitioning to home. Made aware by d/c planners this patient is a long term care resident at [facility].   [facility] CM services not appropriate at this time.   Rutherford Limerick RN, BSN [address] Acute Care Coordinator [phone]) Business Mobile [phone]) Toll free office

## 2018-04-13 DIAGNOSIS — F321 Major depressive disorder, single episode, moderate: Secondary | ICD-10-CM | POA: Diagnosis not present

## 2018-04-13 DIAGNOSIS — G4700 Insomnia, unspecified: Secondary | ICD-10-CM | POA: Diagnosis not present

## 2018-04-13 DIAGNOSIS — G3184 Mild cognitive impairment, so stated: Secondary | ICD-10-CM | POA: Diagnosis not present

## 2018-04-22 DIAGNOSIS — R55 Syncope and collapse: Secondary | ICD-10-CM | POA: Diagnosis not present

## 2018-04-22 DIAGNOSIS — R1310 Dysphagia, unspecified: Secondary | ICD-10-CM | POA: Diagnosis not present

## 2018-04-22 DIAGNOSIS — I2510 Atherosclerotic heart disease of native coronary artery without angina pectoris: Secondary | ICD-10-CM | POA: Diagnosis not present

## 2018-04-22 DIAGNOSIS — I69191 Dysphagia following nontraumatic intracerebral hemorrhage: Secondary | ICD-10-CM | POA: Diagnosis not present

## 2018-04-22 DIAGNOSIS — I482 Chronic atrial fibrillation, unspecified: Secondary | ICD-10-CM | POA: Diagnosis not present

## 2018-04-22 DIAGNOSIS — I272 Pulmonary hypertension, unspecified: Secondary | ICD-10-CM | POA: Diagnosis not present

## 2018-04-22 DIAGNOSIS — I10 Essential (primary) hypertension: Secondary | ICD-10-CM | POA: Diagnosis not present

## 2018-04-22 DIAGNOSIS — Z4800 Encounter for change or removal of nonsurgical wound dressing: Secondary | ICD-10-CM | POA: Diagnosis not present

## 2018-04-22 DIAGNOSIS — Z87440 Personal history of urinary (tract) infections: Secondary | ICD-10-CM | POA: Diagnosis not present

## 2018-04-22 DIAGNOSIS — I69115 Cognitive social or emotional deficit following nontraumatic intracerebral hemorrhage: Secondary | ICD-10-CM | POA: Diagnosis not present

## 2018-04-22 DIAGNOSIS — R600 Localized edema: Secondary | ICD-10-CM | POA: Diagnosis not present

## 2018-04-22 DIAGNOSIS — I69154 Hemiplegia and hemiparesis following nontraumatic intracerebral hemorrhage affecting left non-dominant side: Secondary | ICD-10-CM | POA: Diagnosis not present

## 2018-04-22 DIAGNOSIS — S51802D Unspecified open wound of left forearm, subsequent encounter: Secondary | ICD-10-CM | POA: Diagnosis not present

## 2018-04-23 ENCOUNTER — Other Ambulatory Visit: Payer: Self-pay

## 2018-04-23 DIAGNOSIS — I69191 Dysphagia following nontraumatic intracerebral hemorrhage: Secondary | ICD-10-CM | POA: Diagnosis not present

## 2018-04-23 DIAGNOSIS — R55 Syncope and collapse: Secondary | ICD-10-CM | POA: Diagnosis not present

## 2018-04-23 DIAGNOSIS — I69115 Cognitive social or emotional deficit following nontraumatic intracerebral hemorrhage: Secondary | ICD-10-CM | POA: Diagnosis not present

## 2018-04-23 DIAGNOSIS — I69154 Hemiplegia and hemiparesis following nontraumatic intracerebral hemorrhage affecting left non-dominant side: Secondary | ICD-10-CM | POA: Diagnosis not present

## 2018-04-23 DIAGNOSIS — R600 Localized edema: Secondary | ICD-10-CM | POA: Diagnosis not present

## 2018-04-23 DIAGNOSIS — R1310 Dysphagia, unspecified: Secondary | ICD-10-CM | POA: Diagnosis not present

## 2018-04-23 NOTE — Patient Outreach (Signed)
[facility]) Care Management  04/23/2018  TEDDY PENA 05/19/1926 [id number]   EMMI- General Discharge RED ON EMMI ALERT Day # 1 Date: 04/22/2018 Red Alert Reason:  New prescriptions? I don't know  Outreach attempt: Spoke with daughter [name] who is primary caregiver and on [place].  She is able to verify HIPAA.  Discussed red alerts.  She states she and the home health nurse had some questions about patient medications but has since got things cleared up.  She states that patient is doing well.  She declines any other questions, concerns, or needs at this time.   Plan: RN CM will close case.    Jone Baseman, RN, MSN [facility] Care Management Care Management Coordinator Direct Line [phone] Toll Free: [phone]  Fax: [phone]

## 2018-04-27 DIAGNOSIS — I629 Nontraumatic intracranial hemorrhage, unspecified: Secondary | ICD-10-CM | POA: Diagnosis not present

## 2018-04-27 DIAGNOSIS — R1310 Dysphagia, unspecified: Secondary | ICD-10-CM | POA: Diagnosis not present

## 2018-04-27 DIAGNOSIS — I69191 Dysphagia following nontraumatic intracerebral hemorrhage: Secondary | ICD-10-CM | POA: Diagnosis not present

## 2018-04-27 DIAGNOSIS — I2510 Atherosclerotic heart disease of native coronary artery without angina pectoris: Secondary | ICD-10-CM | POA: Diagnosis not present

## 2018-04-27 DIAGNOSIS — R55 Syncope and collapse: Secondary | ICD-10-CM | POA: Diagnosis not present

## 2018-04-27 DIAGNOSIS — R600 Localized edema: Secondary | ICD-10-CM | POA: Diagnosis not present

## 2018-04-27 DIAGNOSIS — I4821 Permanent atrial fibrillation: Secondary | ICD-10-CM | POA: Diagnosis not present

## 2018-04-27 DIAGNOSIS — I69115 Cognitive social or emotional deficit following nontraumatic intracerebral hemorrhage: Secondary | ICD-10-CM | POA: Diagnosis not present

## 2018-04-27 DIAGNOSIS — E876 Hypokalemia: Secondary | ICD-10-CM | POA: Diagnosis not present

## 2018-04-27 DIAGNOSIS — I10 Essential (primary) hypertension: Secondary | ICD-10-CM | POA: Diagnosis not present

## 2018-04-27 DIAGNOSIS — I69154 Hemiplegia and hemiparesis following nontraumatic intracerebral hemorrhage affecting left non-dominant side: Secondary | ICD-10-CM | POA: Diagnosis not present

## 2018-04-28 DIAGNOSIS — I69115 Cognitive social or emotional deficit following nontraumatic intracerebral hemorrhage: Secondary | ICD-10-CM | POA: Diagnosis not present

## 2018-04-28 DIAGNOSIS — R1310 Dysphagia, unspecified: Secondary | ICD-10-CM | POA: Diagnosis not present

## 2018-04-28 DIAGNOSIS — I69154 Hemiplegia and hemiparesis following nontraumatic intracerebral hemorrhage affecting left non-dominant side: Secondary | ICD-10-CM | POA: Diagnosis not present

## 2018-04-28 DIAGNOSIS — R600 Localized edema: Secondary | ICD-10-CM | POA: Diagnosis not present

## 2018-04-28 DIAGNOSIS — I69191 Dysphagia following nontraumatic intracerebral hemorrhage: Secondary | ICD-10-CM | POA: Diagnosis not present

## 2018-04-28 DIAGNOSIS — R55 Syncope and collapse: Secondary | ICD-10-CM | POA: Diagnosis not present

## 2018-04-30 DIAGNOSIS — R55 Syncope and collapse: Secondary | ICD-10-CM | POA: Diagnosis not present

## 2018-04-30 DIAGNOSIS — R1310 Dysphagia, unspecified: Secondary | ICD-10-CM | POA: Diagnosis not present

## 2018-04-30 DIAGNOSIS — I69154 Hemiplegia and hemiparesis following nontraumatic intracerebral hemorrhage affecting left non-dominant side: Secondary | ICD-10-CM | POA: Diagnosis not present

## 2018-04-30 DIAGNOSIS — R600 Localized edema: Secondary | ICD-10-CM | POA: Diagnosis not present

## 2018-04-30 DIAGNOSIS — I69115 Cognitive social or emotional deficit following nontraumatic intracerebral hemorrhage: Secondary | ICD-10-CM | POA: Diagnosis not present

## 2018-04-30 DIAGNOSIS — I69191 Dysphagia following nontraumatic intracerebral hemorrhage: Secondary | ICD-10-CM | POA: Diagnosis not present

## 2018-05-03 DIAGNOSIS — R600 Localized edema: Secondary | ICD-10-CM | POA: Diagnosis not present

## 2018-05-03 DIAGNOSIS — R55 Syncope and collapse: Secondary | ICD-10-CM | POA: Diagnosis not present

## 2018-05-03 DIAGNOSIS — I69154 Hemiplegia and hemiparesis following nontraumatic intracerebral hemorrhage affecting left non-dominant side: Secondary | ICD-10-CM | POA: Diagnosis not present

## 2018-05-03 DIAGNOSIS — R1310 Dysphagia, unspecified: Secondary | ICD-10-CM | POA: Diagnosis not present

## 2018-05-03 DIAGNOSIS — I69191 Dysphagia following nontraumatic intracerebral hemorrhage: Secondary | ICD-10-CM | POA: Diagnosis not present

## 2018-05-03 DIAGNOSIS — I69115 Cognitive social or emotional deficit following nontraumatic intracerebral hemorrhage: Secondary | ICD-10-CM | POA: Diagnosis not present

## 2018-05-04 DIAGNOSIS — I69154 Hemiplegia and hemiparesis following nontraumatic intracerebral hemorrhage affecting left non-dominant side: Secondary | ICD-10-CM | POA: Diagnosis not present

## 2018-05-04 DIAGNOSIS — R1310 Dysphagia, unspecified: Secondary | ICD-10-CM | POA: Diagnosis not present

## 2018-05-04 DIAGNOSIS — R55 Syncope and collapse: Secondary | ICD-10-CM | POA: Diagnosis not present

## 2018-05-04 DIAGNOSIS — R600 Localized edema: Secondary | ICD-10-CM | POA: Diagnosis not present

## 2018-05-04 DIAGNOSIS — I69115 Cognitive social or emotional deficit following nontraumatic intracerebral hemorrhage: Secondary | ICD-10-CM | POA: Diagnosis not present

## 2018-05-04 DIAGNOSIS — I69191 Dysphagia following nontraumatic intracerebral hemorrhage: Secondary | ICD-10-CM | POA: Diagnosis not present

## 2018-05-05 DIAGNOSIS — I69115 Cognitive social or emotional deficit following nontraumatic intracerebral hemorrhage: Secondary | ICD-10-CM | POA: Diagnosis not present

## 2018-05-05 DIAGNOSIS — R55 Syncope and collapse: Secondary | ICD-10-CM | POA: Diagnosis not present

## 2018-05-05 DIAGNOSIS — I69154 Hemiplegia and hemiparesis following nontraumatic intracerebral hemorrhage affecting left non-dominant side: Secondary | ICD-10-CM | POA: Diagnosis not present

## 2018-05-05 DIAGNOSIS — R600 Localized edema: Secondary | ICD-10-CM | POA: Diagnosis not present

## 2018-05-05 DIAGNOSIS — I69191 Dysphagia following nontraumatic intracerebral hemorrhage: Secondary | ICD-10-CM | POA: Diagnosis not present

## 2018-05-05 DIAGNOSIS — R1310 Dysphagia, unspecified: Secondary | ICD-10-CM | POA: Diagnosis not present

## 2018-05-06 DIAGNOSIS — I69115 Cognitive social or emotional deficit following nontraumatic intracerebral hemorrhage: Secondary | ICD-10-CM | POA: Diagnosis not present

## 2018-05-06 DIAGNOSIS — I69154 Hemiplegia and hemiparesis following nontraumatic intracerebral hemorrhage affecting left non-dominant side: Secondary | ICD-10-CM | POA: Diagnosis not present

## 2018-05-06 DIAGNOSIS — I69191 Dysphagia following nontraumatic intracerebral hemorrhage: Secondary | ICD-10-CM | POA: Diagnosis not present

## 2018-05-06 DIAGNOSIS — R1310 Dysphagia, unspecified: Secondary | ICD-10-CM | POA: Diagnosis not present

## 2018-05-06 DIAGNOSIS — R600 Localized edema: Secondary | ICD-10-CM | POA: Diagnosis not present

## 2018-05-06 DIAGNOSIS — R55 Syncope and collapse: Secondary | ICD-10-CM | POA: Diagnosis not present

## 2018-05-11 DIAGNOSIS — I69191 Dysphagia following nontraumatic intracerebral hemorrhage: Secondary | ICD-10-CM | POA: Diagnosis not present

## 2018-05-11 DIAGNOSIS — R1310 Dysphagia, unspecified: Secondary | ICD-10-CM | POA: Diagnosis not present

## 2018-05-11 DIAGNOSIS — R55 Syncope and collapse: Secondary | ICD-10-CM | POA: Diagnosis not present

## 2018-05-11 DIAGNOSIS — I69115 Cognitive social or emotional deficit following nontraumatic intracerebral hemorrhage: Secondary | ICD-10-CM | POA: Diagnosis not present

## 2018-05-11 DIAGNOSIS — R600 Localized edema: Secondary | ICD-10-CM | POA: Diagnosis not present

## 2018-05-11 DIAGNOSIS — I69154 Hemiplegia and hemiparesis following nontraumatic intracerebral hemorrhage affecting left non-dominant side: Secondary | ICD-10-CM | POA: Diagnosis not present

## 2018-05-13 DIAGNOSIS — I69115 Cognitive social or emotional deficit following nontraumatic intracerebral hemorrhage: Secondary | ICD-10-CM | POA: Diagnosis not present

## 2018-05-13 DIAGNOSIS — I69154 Hemiplegia and hemiparesis following nontraumatic intracerebral hemorrhage affecting left non-dominant side: Secondary | ICD-10-CM | POA: Diagnosis not present

## 2018-05-13 DIAGNOSIS — I69191 Dysphagia following nontraumatic intracerebral hemorrhage: Secondary | ICD-10-CM | POA: Diagnosis not present

## 2018-05-13 DIAGNOSIS — R600 Localized edema: Secondary | ICD-10-CM | POA: Diagnosis not present

## 2018-05-13 DIAGNOSIS — R1310 Dysphagia, unspecified: Secondary | ICD-10-CM | POA: Diagnosis not present

## 2018-05-13 DIAGNOSIS — R55 Syncope and collapse: Secondary | ICD-10-CM | POA: Diagnosis not present

## 2018-07-20 DIAGNOSIS — I10 Essential (primary) hypertension: Secondary | ICD-10-CM | POA: Diagnosis not present

## 2018-07-20 DIAGNOSIS — E876 Hypokalemia: Secondary | ICD-10-CM | POA: Diagnosis not present

## 2018-07-20 DIAGNOSIS — I2510 Atherosclerotic heart disease of native coronary artery without angina pectoris: Secondary | ICD-10-CM | POA: Diagnosis not present

## 2018-07-20 DIAGNOSIS — Z7189 Other specified counseling: Secondary | ICD-10-CM | POA: Diagnosis not present

## 2018-07-20 DIAGNOSIS — Z0000 Encounter for general adult medical examination without abnormal findings: Secondary | ICD-10-CM | POA: Diagnosis not present

## 2018-07-27 DIAGNOSIS — N390 Urinary tract infection, site not specified: Secondary | ICD-10-CM | POA: Diagnosis not present

## 2018-07-29 DIAGNOSIS — M810 Age-related osteoporosis without current pathological fracture: Secondary | ICD-10-CM | POA: Diagnosis not present

## 2018-07-29 DIAGNOSIS — E876 Hypokalemia: Secondary | ICD-10-CM | POA: Diagnosis not present

## 2018-07-29 DIAGNOSIS — I10 Essential (primary) hypertension: Secondary | ICD-10-CM | POA: Diagnosis not present

## 2018-07-29 DIAGNOSIS — K4090 Unilateral inguinal hernia, without obstruction or gangrene, not specified as recurrent: Secondary | ICD-10-CM | POA: Diagnosis not present

## 2018-07-29 DIAGNOSIS — I2510 Atherosclerotic heart disease of native coronary artery without angina pectoris: Secondary | ICD-10-CM | POA: Diagnosis not present

## 2018-07-29 DIAGNOSIS — M159 Polyosteoarthritis, unspecified: Secondary | ICD-10-CM | POA: Diagnosis not present

## 2018-07-29 DIAGNOSIS — I4821 Permanent atrial fibrillation: Secondary | ICD-10-CM | POA: Diagnosis not present

## 2018-07-29 DIAGNOSIS — I629 Nontraumatic intracranial hemorrhage, unspecified: Secondary | ICD-10-CM | POA: Diagnosis not present

## 2018-07-29 DIAGNOSIS — R42 Dizziness and giddiness: Secondary | ICD-10-CM | POA: Diagnosis not present

## 2018-08-09 ENCOUNTER — Other Ambulatory Visit: Payer: Self-pay | Admitting: Internal Medicine

## 2018-08-09 DIAGNOSIS — Z1231 Encounter for screening mammogram for malignant neoplasm of breast: Secondary | ICD-10-CM

## 2018-09-23 ENCOUNTER — Ambulatory Visit
Admission: RE | Admit: 2018-09-23 | Discharge: 2018-09-23 | Disposition: A | Discharge status: Home / Self Care | Payer: Medicare Other | Source: Ambulatory Visit | Attending: Internal Medicine | Admitting: Internal Medicine

## 2018-09-23 ENCOUNTER — Other Ambulatory Visit: Payer: Self-pay

## 2018-09-23 DIAGNOSIS — Z1231 Encounter for screening mammogram for malignant neoplasm of breast: Secondary | ICD-10-CM

## 2019-01-27 DIAGNOSIS — I10 Essential (primary) hypertension: Secondary | ICD-10-CM | POA: Diagnosis not present

## 2019-03-28 DIAGNOSIS — C4442 Squamous cell carcinoma of skin of scalp and neck: Secondary | ICD-10-CM | POA: Diagnosis not present

## 2019-03-28 DIAGNOSIS — D485 Neoplasm of uncertain behavior of skin: Secondary | ICD-10-CM | POA: Diagnosis not present

## 2019-03-28 DIAGNOSIS — L570 Actinic keratosis: Secondary | ICD-10-CM | POA: Diagnosis not present

## 2019-03-28 DIAGNOSIS — Z85828 Personal history of other malignant neoplasm of skin: Secondary | ICD-10-CM | POA: Diagnosis not present

## 2019-03-28 DIAGNOSIS — C44629 Squamous cell carcinoma of skin of left upper limb, including shoulder: Secondary | ICD-10-CM | POA: Diagnosis not present

## 2019-03-28 DIAGNOSIS — L905 Scar conditions and fibrosis of skin: Secondary | ICD-10-CM | POA: Diagnosis not present

## 2019-03-28 DIAGNOSIS — L821 Other seborrheic keratosis: Secondary | ICD-10-CM | POA: Diagnosis not present

## 2019-04-09 ENCOUNTER — Ambulatory Visit: Payer: Medicare Other | Attending: Internal Medicine

## 2019-04-09 DIAGNOSIS — Z23 Encounter for immunization: Secondary | ICD-10-CM | POA: Insufficient documentation

## 2019-04-09 NOTE — Progress Notes (Signed)
   Covid-19 Vaccination Clinic  Name:  DANNAPAOLA HOLLOMAN    MRN: [id number] DOB: [birth year]  04/09/2019  Ms. Marquis was observed post Covid-19 immunization for 15 minutes without incidence. She was provided with Vaccine Information Sheet and instruction to access the V-Safe system.   Ms. Mauger was instructed to call 911 with any severe reactions post vaccine: Marland Kitchen Difficulty breathing  . Swelling of your face and throat  . A fast heartbeat  . A bad rash all over your body  . Dizziness and weakness    Immunizations Administered    Name Date Dose VIS Date Route   Pfizer COVID-19 Vaccine 04/09/2019 12:35 PM 0.3 mL 01/28/2019 Intramuscular   Manufacturer: [place]   Lot: [id number]   [place]: [id number]

## 2019-04-27 DIAGNOSIS — L820 Inflamed seborrheic keratosis: Secondary | ICD-10-CM | POA: Diagnosis not present

## 2019-04-27 DIAGNOSIS — D044 Carcinoma in situ of skin of scalp and neck: Secondary | ICD-10-CM | POA: Diagnosis not present

## 2019-05-02 ENCOUNTER — Ambulatory Visit: Payer: Medicare Other | Attending: Internal Medicine

## 2019-05-02 DIAGNOSIS — Z23 Encounter for immunization: Secondary | ICD-10-CM

## 2019-05-02 NOTE — Progress Notes (Signed)
   Covid-19 Vaccination Clinic  Name:  [name]    MRN: [id number] DOB: 06/12/1926  05/02/2019  [name] was observed post Covid-19 immunization for 15 minutes without incident. She was provided with Vaccine Information Sheet and instruction to access the V-Safe system.   [name] was instructed to call 911 with any severe reactions post vaccine: Marland Kitchen Difficulty breathing  . Swelling of face and throat  . A fast heartbeat  . A bad rash all over body  . Dizziness and weakness   Immunizations Administered    Name Date Dose VIS Date Route   Pfizer COVID-19 Vaccine 05/02/2019  9:42 AM 0.3 mL 01/28/2019 Intramuscular   Manufacturer: [place]   Lot: [id number]   [place]: [id number]

## 2019-05-04 DIAGNOSIS — C44629 Squamous cell carcinoma of skin of left upper limb, including shoulder: Secondary | ICD-10-CM | POA: Diagnosis not present

## 2019-05-15 ENCOUNTER — Other Ambulatory Visit: Payer: Self-pay

## 2019-05-15 ENCOUNTER — Inpatient Hospital Stay (HOSPITAL_COMMUNITY)
Admission: EM | Admit: 2019-05-15 | Discharge: 2019-05-18 | DRG: 057 | Disposition: A | Discharge status: Home / Self Care | Payer: Medicare Other | Attending: Family Medicine | Admitting: Family Medicine

## 2019-05-15 ENCOUNTER — Emergency Department (HOSPITAL_COMMUNITY): Payer: Medicare Other

## 2019-05-15 DIAGNOSIS — Z03818 Encounter for observation for suspected exposure to other biological agents ruled out: Secondary | ICD-10-CM | POA: Diagnosis not present

## 2019-05-15 DIAGNOSIS — R0902 Hypoxemia: Secondary | ICD-10-CM | POA: Diagnosis present

## 2019-05-15 DIAGNOSIS — R001 Bradycardia, unspecified: Secondary | ICD-10-CM | POA: Diagnosis present

## 2019-05-15 DIAGNOSIS — F0280 Dementia in other diseases classified elsewhere without behavioral disturbance: Secondary | ICD-10-CM | POA: Diagnosis present

## 2019-05-15 DIAGNOSIS — Z66 Do not resuscitate: Secondary | ICD-10-CM | POA: Diagnosis present

## 2019-05-15 DIAGNOSIS — R55 Syncope and collapse: Secondary | ICD-10-CM | POA: Diagnosis not present

## 2019-05-15 DIAGNOSIS — Z8673 Personal history of transient ischemic attack (TIA), and cerebral infarction without residual deficits: Secondary | ICD-10-CM

## 2019-05-15 DIAGNOSIS — R9431 Abnormal electrocardiogram [ECG] [EKG]: Secondary | ICD-10-CM | POA: Diagnosis not present

## 2019-05-15 DIAGNOSIS — Z20822 Contact with and (suspected) exposure to covid-19: Secondary | ICD-10-CM | POA: Diagnosis present

## 2019-05-15 DIAGNOSIS — I4821 Permanent atrial fibrillation: Secondary | ICD-10-CM | POA: Diagnosis present

## 2019-05-15 DIAGNOSIS — G3183 Dementia with Lewy bodies: Secondary | ICD-10-CM | POA: Diagnosis not present

## 2019-05-15 DIAGNOSIS — I272 Pulmonary hypertension, unspecified: Secondary | ICD-10-CM | POA: Diagnosis not present

## 2019-05-15 DIAGNOSIS — I2510 Atherosclerotic heart disease of native coronary artery without angina pectoris: Secondary | ICD-10-CM | POA: Diagnosis present

## 2019-05-15 DIAGNOSIS — Z87891 Personal history of nicotine dependence: Secondary | ICD-10-CM

## 2019-05-15 DIAGNOSIS — I4891 Unspecified atrial fibrillation: Secondary | ICD-10-CM | POA: Diagnosis present

## 2019-05-15 DIAGNOSIS — Z79899 Other long term (current) drug therapy: Secondary | ICD-10-CM

## 2019-05-15 DIAGNOSIS — R404 Transient alteration of awareness: Secondary | ICD-10-CM | POA: Diagnosis not present

## 2019-05-15 DIAGNOSIS — Z9861 Coronary angioplasty status: Secondary | ICD-10-CM

## 2019-05-15 DIAGNOSIS — E782 Mixed hyperlipidemia: Secondary | ICD-10-CM | POA: Diagnosis present

## 2019-05-15 DIAGNOSIS — Z90710 Acquired absence of both cervix and uterus: Secondary | ICD-10-CM

## 2019-05-15 DIAGNOSIS — R410 Disorientation, unspecified: Secondary | ICD-10-CM | POA: Diagnosis not present

## 2019-05-15 DIAGNOSIS — R4020 Unspecified coma: Secondary | ICD-10-CM | POA: Diagnosis not present

## 2019-05-15 DIAGNOSIS — I10 Essential (primary) hypertension: Secondary | ICD-10-CM | POA: Diagnosis present

## 2019-05-15 DIAGNOSIS — K219 Gastro-esophageal reflux disease without esophagitis: Secondary | ICD-10-CM | POA: Diagnosis present

## 2019-05-15 LAB — URINALYSIS, ROUTINE W REFLEX MICROSCOPIC
Bilirubin Urine: NEGATIVE
Glucose, UA: NEGATIVE mg/dL
Hgb urine dipstick: NEGATIVE
Ketones, ur: NEGATIVE mg/dL
Leukocytes,Ua: NEGATIVE
Nitrite: NEGATIVE
Protein, ur: NEGATIVE mg/dL
Specific Gravity, Urine: 1.015 (ref 1.005–1.030)
pH: 7 (ref 5.0–8.0)

## 2019-05-15 LAB — COMPREHENSIVE METABOLIC PANEL
ALT: 14 U/L (ref 0–44)
AST: 25 U/L (ref 15–41)
Albumin: 3.6 g/dL (ref 3.5–5.0)
Alkaline Phosphatase: 43 U/L (ref 38–126)
Anion gap: 12 (ref 5–15)
BUN: 18 mg/dL (ref 8–23)
CO2: 24 mmol/L (ref 22–32)
Calcium: 9 mg/dL (ref 8.9–10.3)
Chloride: 103 mmol/L (ref 98–111)
Creatinine, Ser: 0.86 mg/dL (ref 0.44–1.00)
GFR calc Af Amer: >60 mL/min (ref >60)
GFR calc non Af Amer: 59 mL/min — ABNORMAL LOW (ref >60)
Glucose, Bld: 129 mg/dL — ABNORMAL HIGH (ref 70–99)
Potassium: 4.1 mmol/L (ref 3.5–5.1)
Sodium: 139 mmol/L (ref 135–145)
Total Bilirubin: 0.9 mg/dL (ref 0.3–1.2)
Total Protein: 6.1 g/dL — ABNORMAL LOW (ref 6.5–8.1)

## 2019-05-15 LAB — CBC WITH DIFFERENTIAL/PLATELET
Abs Immature Granulocytes: 0.06 10*3/uL (ref 0.00–0.07)
Basophils Absolute: 0.1 10*3/uL (ref 0.0–0.1)
Basophils Relative: 1 %
Eosinophils Absolute: 0.3 10*3/uL (ref 0.0–0.5)
Eosinophils Relative: 4 %
HCT: 37.4 % (ref 36.0–46.0)
Hemoglobin: 12.3 g/dL (ref 12.0–15.0)
Immature Granulocytes: 1 %
Lymphocytes Relative: 29 %
Lymphs Abs: 2.1 10*3/uL (ref 0.7–4.0)
MCH: 30.8 pg (ref 26.0–34.0)
MCHC: 32.9 g/dL (ref 30.0–36.0)
MCV: 93.7 fL (ref 80.0–100.0)
Monocytes Absolute: 0.9 10*3/uL (ref 0.1–1.0)
Monocytes Relative: 12 %
Neutro Abs: 4 10*3/uL (ref 1.7–7.7)
Neutrophils Relative %: 53 %
Platelets: 150 10*3/uL (ref 150–400)
RBC: 3.99 MIL/uL (ref 3.87–5.11)
RDW: 13.2 % (ref 11.5–15.5)
WBC: 7.4 10*3/uL (ref 4.0–10.5)
nRBC: 0 % (ref 0.0–0.2)

## 2019-05-15 LAB — CBG MONITORING, ED: Glucose-Capillary: 135 mg/dL — ABNORMAL HIGH (ref 70–99)

## 2019-05-15 MED ORDER — METOPROLOL TARTRATE 50 MG PO TABS
50.0000 mg | ORAL_TABLET | Freq: Every day | ORAL | Status: DC
  Administered 2019-05-16: 50 mg via ORAL
  Filled 2019-05-15: qty 1

## 2019-05-15 MED ORDER — IRBESARTAN 75 MG PO TABS
150.0000 mg | ORAL_TABLET | Freq: Every day | ORAL | Status: DC
  Administered 2019-05-16 – 2019-05-18 (×2): 150 mg via ORAL
  Filled 2019-05-15 (×3): qty 2

## 2019-05-15 MED ORDER — MELATONIN 3 MG PO TABS
6.0000 mg | ORAL_TABLET | Freq: Every evening | ORAL | Status: DC | PRN
  Administered 2019-05-16: 6 mg via ORAL
  Filled 2019-05-15 (×2): qty 2

## 2019-05-15 MED ORDER — ENOXAPARIN SODIUM 40 MG/0.4ML ~~LOC~~ SOLN
40.0000 mg | SUBCUTANEOUS | Status: DC
  Administered 2019-05-15: 40 mg via SUBCUTANEOUS
  Filled 2019-05-15: qty 0.4

## 2019-05-15 MED ORDER — PANTOPRAZOLE SODIUM 40 MG PO TBEC
40.0000 mg | DELAYED_RELEASE_TABLET | Freq: Every day | ORAL | Status: DC
  Administered 2019-05-16 – 2019-05-18 (×2): 40 mg via ORAL
  Filled 2019-05-15 (×3): qty 1

## 2019-05-15 MED ORDER — LEVETIRACETAM 500 MG PO TABS
500.0000 mg | ORAL_TABLET | Freq: Every day | ORAL | Status: DC
  Administered 2019-05-16 – 2019-05-18 (×3): 500 mg via ORAL
  Filled 2019-05-15 (×3): qty 1

## 2019-05-15 MED ORDER — SERTRALINE HCL 50 MG PO TABS
50.0000 mg | ORAL_TABLET | Freq: Every day | ORAL | Status: DC
  Administered 2019-05-16 – 2019-05-18 (×3): 50 mg via ORAL
  Filled 2019-05-15 (×3): qty 1

## 2019-05-15 MED ORDER — SODIUM CHLORIDE 0.9 % IV SOLN: INTRAVENOUS | Status: DC

## 2019-05-15 NOTE — ED Notes (Signed)
Daughter Brenda at bedside

## 2019-05-15 NOTE — H&P (Signed)
History and Physical   [name]:[id number] DOB: 03-25-1926 DOA: 05/15/2019  Referring MD/NP/PA: Dr. Wilson Singer  PCP: Merrilee Seashore, MD   Patient coming from: Home  Chief Complaint: Passing out  HPI: [name] is a 86 y.o. female with medical history significant of coronary artery disease, atrial fibrillation, GERD, previous syncope, pulmonary hypertension, previous intracranial hemorrhage, recurrent UTIs who is a DNR and lives at home.  Patient came in to the ER after prolonged syncopal episode today at home.  Daughter reported patient being out almost 30 minutes.  She was reportedly sitting in a chair getting ready to shower when she told her daughter she was about to pass out.  She felt very hot and then slumped to the right side.  Patient's eyes were open the entire time.  They were waiting for her for more than 10 minutes but was still out.  She finally came around 30 minutes later and was fully conscious.  Patient appears to be back to her baseline since then.  She denied hitting her head.  She has had previous intracranial bleed but no evidence now.  Patient has been on prophylaxis for seizure.  Her telemetry showed mild bradycardia with pulse in the 60s.  No obvious cause of her symptoms.  No obvious orthostasis.  Patient is being admitted for observation and work-up of syncope.  She had extensive cardiac history and arrhythmias.  Blood sugar appears well controlled.  Patient being admitted for evaluation of syncope versus seizure..  ED Course: Temperature 98 for blood pressure 174/80 pulse 55 respiratory rate of 21 oxygen sat 81% on room air currently 98% on oxygen.  Chemistry largely within normal except for glucose of 129.  CBC also within normal.  COVID-19 screen currently pending.  Head CT without contrast showed no acute findings but no significant findings  Review of Systems: As per HPI otherwise 10 point review of systems negative.    Past Medical History:    Diagnosis Date   Ankle fracture    Anxiety    Arthritis    Barrett's esophagus    Chronic atrial fibrillation    a. Pt previously declined DCCV.   Coronary artery disease    a. s/p Cypher DES to LAD in 2006;  b. Last LHC 02/2008:  pLAD 30%, mLAD stent patent, pOM 50%, EF 65%;  c. Lexiscan Myoview 7/14:  Intermediate risk, dist ant and inf-lat ischemia, EF 66% - patient declined cath and elected medical therapy.   GERD (gastroesophageal reflux disease)    Hiatal hernia    Hx of echocardiogram    Echocardiogram 08/17/12: EF 55-60%, MAC, mild MR, mild LAE, mild RVE, mild to moderate RAE, moderate TR, mildly increased pulmonary artery systolic pressures   Hyperlipidemia    Hypertension    Mitral regurgitation    a. Mod by echo 10/2014.   Obesity    Pulmonary hypertension ([place])    a. Mod-severely elevated PA pressures by echo 10/2014.   QT prolongation    a. During 10/2014 admit, QTC 586m.   Tricuspid regurgitation    a. Mod by echo 10/2014.   UTI (lower urinary tract infection) 03/2015    Past Surgical History:  Procedure Laterality Date   ABDOMINAL HYSTERECTOMY     ANKLE SURGERY  2001   BREAST EXCISIONAL BIOPSY Right 1970   BREAST EXCISIONAL BIOPSY Right    BREAST EXCISIONAL BIOPSY Left    CESAREAN SECTION     CORONARY ANGIOPLASTY     ESOPHAGOGASTRODUODENOSCOPY  reports that she has quit smoking. Her smoking use included cigarettes. She has never used smokeless tobacco. She reports that she does not drink alcohol or use drugs.  Allergies  Allergen Reactions   Amlodipine Shortness Of Breath, Swelling and Other (See Comments)    Edema in legs   Methyldopa Shortness Of Breath and Swelling   Shellfish-Derived Products Other (See Comments)    Syncopal episode   Tape Itching and Other (See Comments)    Also TEARS THE SKIN, SO PLEASE USE AN ALTERNATIVE!!   Actonel [Risedronate Sodium] Other (See Comments)    Caused a lump to come up in throat and  caused heartburn   Ciprofloxacin Hcl Other (See Comments)    Confusion    Gas-X [Simethicone] Itching   Hydralazine Hcl Other (See Comments)    Unknown reaction, per patient    Lactose Intolerance (Gi) Diarrhea   Lisinopril Cough   Milk-Related Compounds Diarrhea   Oxycodone Hcl Other (See Comments)    Hallucinations    Pneumococcal Vaccines Swelling and Other (See Comments)    Arm swells   Valsartan Other (See Comments)    Unknown reaction, per pt   Cozaar [Losartan] Rash   Digitek [Digoxin] Rash   Felodipine Rash   Hctz [Hydrochlorothiazide] Itching and Rash    Pt reports causing "itching under the skin"   Simvastatin Rash   Spironolactone Itching and Rash    Family History  Problem Relation Age of Onset   Diabetes Mother    Heart attack Mother    Lung cancer Father    Thyroid disease Sister    Thyroid disease Sister    Hypertension Sister    Hypertension Sister    Hypertension Sister    Hypertension Sister    Hypertension Brother    Hypertension Brother    Breast cancer Other 32   Colon cancer Neg Hx    Colon polyps Neg Hx    Esophageal cancer Neg Hx    Stroke Neg Hx      Prior to Admission medications   Medication Sig Start Date End Date Taking? Authorizing Provider  acetaminophen (TYLENOL) 500 MG tablet Take 500 mg by mouth at bedtime.   Yes [provider]  irbesartan (AVAPRO) 150 MG tablet Take 1 tablet (150 mg total) by mouth daily. Patient taking differently: Take 150 mg by mouth in the morning.  01/19/18  Yes Love, Ivan Anchors, PA-C  levETIRAcetam (KEPPRA) 500 MG tablet Take 1 tablet (500 mg total) by mouth 2 (two) times daily. Patient taking differently: Take 500 mg by mouth in the morning.  12/23/17  Yes Costella, Vista Mink, PA-C  Melatonin 5 MG TABS Take 5 mg by mouth at bedtime as needed (for sleep/anxiety).    Yes [provider]  metoprolol tartrate (LOPRESSOR) 50 MG tablet Take 1 tablet (50 mg total)  by mouth 2 (two) times daily. Patient taking differently: Take 50 mg by mouth in the morning.  01/18/18  Yes Love, Ivan Anchors, PA-C  nitroGLYCERIN (NITROSTAT) 0.4 MG SL tablet Place 1 tablet (0.4 mg total) under the tongue every 5 (five) minutes as needed for chest pain (3 doses only). Patient taking differently: Place 0.4 mg under the tongue every 5 (five) minutes x 3 doses as needed for chest pain.  01/18/18  Yes Love, Ivan Anchors, PA-C  omeprazole (PRILOSEC OTC) 20 MG tablet Take 20 mg by mouth daily before breakfast.   Yes [provider]  potassium chloride SA (K-DUR,KLOR-CON) 20 MEQ tablet  Take 20 mEq by mouth daily.    Yes [provider]  sertraline (ZOLOFT) 50 MG tablet Take 50 mg by mouth daily.   Yes [provider]  acetaminophen (TYLENOL) 325 MG tablet Take 2 tablets (650 mg total) by mouth every 6 (six) hours. Patient not taking: Reported on 05/15/2019 01/18/18   Love, Ivan Anchors, PA-C  cephALEXin (KEFLEX) 500 MG capsule Take 1 capsule (500 mg total) by mouth 2 (two) times daily. Patient not taking: Reported on 05/15/2019 03/12/18   Blanchie Dessert, MD  docusate sodium (COLACE) 100 MG capsule Take 1 capsule (100 mg total) by mouth 2 (two) times daily. Patient not taking: Reported on 05/15/2019 01/18/18   Love, Ivan Anchors, PA-C  magnesium oxide (MAG-OX) 400 (241.3 Mg) MG tablet Take 0.5 tablets (200 mg total) by mouth 2 (two) times daily. Patient not taking: Reported on 05/15/2019 01/18/18   Love, Ivan Anchors, PA-C  Menthol-Methyl Salicylate (MUSCLE RUB) 10-15 % CREA Apply 1 application topically every 6 (six) hours. To bilateral knees Patient not taking: Reported on 05/15/2019 01/18/18   Bary Leriche, PA-C  mouth rinse LIQD solution 15 mLs by Mouth Rinse route 2 (two) times daily. Patient not taking: Reported on 05/15/2019 01/18/18   Bary Leriche, PA-C  Multiple Vitamin (MULTIVITAMIN WITH MINERALS) TABS tablet Take 1 tablet by mouth daily. Patient not taking: Reported on  05/15/2019 01/19/18   Love, Ivan Anchors, PA-C  polyethylene glycol [facility] / GLYCOLAX) packet Take 17 g by mouth daily. Patient not taking: Reported on 05/15/2019 01/19/18   Love, Ivan Anchors, PA-C  ranitidine (ZANTAC) 150 MG/10ML syrup Take 10 mLs (150 mg total) by mouth 2 (two) times daily. Patient not taking: Reported on 05/15/2019 01/18/18   Love, Ivan Anchors, PA-C  simethicone (MYLICON) 80 MG chewable tablet Chew 1 tablet (80 mg total) by mouth 4 (four) times daily as needed for flatulence. Patient not taking: Reported on 05/15/2019 01/18/18   Bary Leriche, PA-C  tamsulosin (FLOMAX) 0.4 MG CAPS capsule Take 1 capsule (0.4 mg total) by mouth daily after supper. Patient not taking: Reported on 05/15/2019 01/18/18   Flora Lipps    Physical Exam: Vitals:   05/15/19 2030 05/15/19 2137 05/15/19 2140 05/15/19 2142  BP: (!) 159/92 (!) 157/96 (!) 140/111 (!) 159/83  Pulse: 61 98 76 99  Resp: 16     Temp: 98.4 F (36.9 C)     SpO2: 100% 94% 100% 98%  Weight: 63 kg         Constitutional: NAD, calm, comfortable Vitals:   05/15/19 2030 05/15/19 2137 05/15/19 2140 05/15/19 2142  BP: (!) 159/92 (!) 157/96 (!) 140/111 (!) 159/83  Pulse: 61 98 76 99  Resp: 16     Temp: 98.4 F (36.9 C)     SpO2: 100% 94% 100% 98%  Weight: 63 kg      Eyes: PERRL, lids and conjunctivae normal ENMT: Mucous membranes are dry posterior pharynx clear of any exudate or lesions.Normal dentition.  Neck: normal, supple, no masses, no thyromegaly Respiratory: clear to auscultation bilaterally, no wheezing, no crackles. Normal respiratory effort. No accessory muscle use.  Cardiovascular: Regular rate and rhythm, no murmurs / rubs / gallops. No extremity edema. 2+ pedal pulses. No carotid bruits.  Abdomen: no tenderness, no masses palpated. No hepatosplenomegaly. Bowel sounds positive.  Musculoskeletal: no clubbing / cyanosis. No joint deformity upper and lower extremities. Good ROM, no contractures. Normal muscle tone.    Skin: no rashes, lesions,  ulcers. No induration Neurologic: CN 2-12 grossly intact. Sensation intact, DTR normal. Strength 5/5 in all 4.  Psychiatric: Slightly confused but largely alert and oriented x 3. Normal mood.     Labs on Admission: I have personally reviewed following labs and imaging studies  CBC: Recent Labs  Lab 05/15/19 1615  WBC 7.4  NEUTROABS 4.0  HGB 12.3  HCT 37.4  MCV 93.7  PLT 962   Basic Metabolic Panel: Recent Labs  Lab 05/15/19 1615  NA 139  K 4.1  CL 103  CO2 24  GLUCOSE 129*  BUN 18  CREATININE 0.86  CALCIUM 9.0   GFR: CrCl cannot be calculated (Unknown ideal weight.). Liver Function Tests: Recent Labs  Lab 05/15/19 1615  AST 25  ALT 14  ALKPHOS 43  BILITOT 0.9  PROT 6.1*  ALBUMIN 3.6   No results for input(s): LIPASE, AMYLASE in the last 168 hours. No results for input(s): AMMONIA in the last 168 hours. Coagulation Profile: No results for input(s): INR, PROTIME in the last 168 hours. Cardiac Enzymes: No results for input(s): CKTOTAL, CKMB, CKMBINDEX, TROPONINI in the last 168 hours. BNP (last 3 results) No results for input(s): PROBNP in the last 8760 hours. HbA1C: No results for input(s): HGBA1C in the last 72 hours. CBG: Recent Labs  Lab 05/15/19 1341  GLUCAP 135*   Lipid Profile: No results for input(s): CHOL, HDL, LDLCALC, TRIG, CHOLHDL, LDLDIRECT in the last 72 hours. Thyroid Function Tests: No results for input(s): TSH, T4TOTAL, FREET4, T3FREE, THYROIDAB in the last 72 hours. Anemia Panel: No results for input(s): VITAMINB12, FOLATE, FERRITIN, TIBC, IRON, RETICCTPCT in the last 72 hours. Urine analysis:    Component Value Date/Time   COLORURINE YELLOW 05/15/2019 [place] 05/15/2019 1355   LABSPEC 1.015 05/15/2019 1355   PHURINE 7.0 05/15/2019 1355   GLUCOSEU NEGATIVE 05/15/2019 1355   HGBUR NEGATIVE 05/15/2019 1355   [place] 05/15/2019 1355   KETONESUR NEGATIVE 05/15/2019 1355    PROTEINUR NEGATIVE 05/15/2019 1355   UROBILINOGEN 0.2 08/17/2012 1829   NITRITE NEGATIVE 05/15/2019 1355   LEUKOCYTESUR NEGATIVE 05/15/2019 1355   Sepsis Labs: _0 (procalcitonin:4,lacticidven:4) )No results found for this or any previous visit (from the past 240 hour(s)).   Radiological Exams on Admission: CT Head Wo Contrast  Result Date: 05/15/2019 CLINICAL DATA:  Syncope. Confusion. EXAM: CT HEAD WITHOUT CONTRAST TECHNIQUE: Contiguous axial images were obtained from the base of the skull through the vertex without intravenous contrast. COMPARISON:  Brain CT 03/12/2018 FINDINGS: Brain: Ventricles and sulci are prominent compatible with atrophy. Periventricular and subcortical white matter hypodensities compatible with chronic microvascular ischemic changes. No evidence for acute cortically based infarct, intracranial hemorrhage, mass lesion or mass-effect. Vascular: Unremarkable Skull: Intact. Sinuses/Orbits: Paranasal sinuses are well aerated. Mastoid air cells are unremarkable. Other: None. IMPRESSION: No acute intracranial process. Atrophy and chronic microvascular ischemic changes. Electronically Signed   By: Lovey Newcomer M.D.   On: 05/15/2019 15:22    EKG: Independently reviewed.  It showed normal sinus rhythm with a rate of 72, normal intervals.  No significant ST changes.  Assessment/Plan Principal Problem:   Syncope and collapse Active Problems:   Mixed hyperlipidemia   GERD   Atrial fibrillation (HCC)   Coronary artery disease involving native coronary artery of native heart without angina pectoris   Benign essential HTN     #1 syncope versus seizure: Patient will be admitted to the telemetry floor.  We will work her up for possible syncope.  She could also have had a seizure disorder especially with history of previous intracranial bleed.  Patient's heart rate is in the bradycardic range currently 60 but was 55 on arrival.  She was also notably hypoxic with oxygen of  81%.  This could all be possibly responsible for patient passing out.  She could also have had vasovagal syncope.  We will admit the patient and check echocardiogram.  Gentle hydration.  Monitor her response on telemetry.  PT and OT consultation.  #2 atrial fibrillation: Currently rate is low to normal.  Continue treatment.  She is in sinus rhythm  #3 mixed hyperlipidemia: Confirm and resume home regimen.  #4 GERD: Continue with PPI  #5 hypertension: Continue blood pressure control using home regimen  #6 coronary artery disease: Stable at baseline   DVT prophylaxis: Lovenox Code Status: DNR Family Communication: Daughter at bedside Disposition Plan: To be determined Consults called: None Admission status: Observation  Severity of Illness: The appropriate patient status for this patient is OBSERVATION. Observation status is judged to be reasonable and necessary in order to provide the required intensity of service to ensure the patient's safety. The patient's presenting symptoms, physical exam findings, and initial radiographic and laboratory data in the context of their medical condition is felt to place them at decreased risk for further clinical deterioration. Furthermore, it is anticipated that the patient will be medically stable for discharge from the hospital within 2 midnights of admission. The following factors support the patient status of observation.   " The patient's presenting symptoms include syncope. " The physical exam findings include no significant findings on exam. " The initial radiographic and laboratory data are bradycardia only.     Barbette Merino MD [facility] Pager [phone]  If 7PM-7AM, please contact night-coverage [email or web address] Password [facility]  05/15/2019, 11:07 PM

## 2019-05-15 NOTE — ED Provider Notes (Signed)
[facility] EMERGENCY DEPARTMENT Provider Note   CSN: [id number] Arrival date & time: 05/15/19  1332     History No chief complaint on file.   [name] is a 86 y.o. female with a past medical history of dementia, previous intracranial hemorrhage, history of pulmonary hypertension, history of recurrent syncope.  There is a level 5 caveat due to dementia and confusion.  History is gathered from the patient's daughter who is at bedside.  She states in November 2019 the patient was complaining of headache and had an intracranial hemorrhage.  Her hemorrhage started with an episode of syncope.  She says that she has been living in her home and has had accelerated dementia since that time with normal mentation prior to her stroke.  Daughter states that over the past 24 hours her confusion has been increased.  Yesterday the patient was complaining of headache which is abnormal for her and she has not had any headaches since the [place].  Today the patient was sitting in a chair getting ready to shower and told her daughter that she felt like she was getting ready to pass out.  The patient stated "I am very hot."  Her daughter relates that the patient then slumped to the right side.  She kept her eyes open.  The daughter waited and try to shake her to see if she would wake up.  She then called her 2 daughters who live close by to come.  They waited for approximately 10 minutes patient still did not regain consciousness at that point she and her daughters called emergency medical services.  Patient was let on the ground by fire in case she needed CPR.  There is a cystic skin tear on the right upper arm due to the movement.  Patient is up-to-date on her tetanus vaccination.  Her daughter states that it took approximately 30 minutes for her to regain consciousness.  She did not have any rhythmic or myoclonic jerking activities.  She has no history of seizures.  Patient has been more confused  since the time of this loss of consciousness.  She has no recent medication changes.  Daughter denies any melanotic stools. HPI     Past Medical History:  Diagnosis Date  . Ankle fracture   . Anxiety   . Arthritis   . Barrett's esophagus   . Chronic atrial fibrillation    a. Pt previously declined DCCV.  Marland Kitchen Coronary artery disease    a. s/p Cypher DES to LAD in 2006;  b. Last LHC 02/2008:  pLAD 30%, mLAD stent patent, pOM 50%, EF 65%;  c. Lexiscan Myoview 7/14:  Intermediate risk, dist ant and inf-lat ischemia, EF 66% - patient declined cath and elected medical therapy.  Marland Kitchen GERD (gastroesophageal reflux disease)   . Hiatal hernia   . Hx of echocardiogram    Echocardiogram 08/17/12: EF 55-60%, MAC, mild MR, mild LAE, mild RVE, mild to moderate RAE, moderate TR, mildly increased pulmonary artery systolic pressures  . Hyperlipidemia   . Hypertension   . Mitral regurgitation    a. Mod by echo 10/2014.  . Obesity   . Pulmonary hypertension ([place])    a. Mod-severely elevated PA pressures by echo 10/2014.  Marland Kitchen QT prolongation    a. During 10/2014 admit, QTC 562m.  . Tricuspid regurgitation    a. Mod by echo 10/2014.  .Marland KitchenUTI (lower urinary tract infection) 03/2015    Patient Active Problem List   Diagnosis Date  Noted  . Recurrent UTI--E coli/Pseudomonas 01/18/2018  . Orthostasis 01/18/2018  . Benign essential HTN   . Cerebral edema (HCC)   . Hypomagnesemia   . Dysphagia, post-stroke   . Lethargy   . Acute blood loss anemia   . Hypoalbuminemia due to protein-calorie malnutrition ([place])   . History of Barrett's esophagus   . Urinary retention   . Seizure prophylaxis   . E. coli UTI   . Intraventricular hemorrhage ([place])   . Urinary tract infection without hematuria   . Coronary artery disease involving native coronary artery of native heart without angina pectoris   . Pulmonary HTN ([place])   . Leukocytosis   . Intracranial hemorrhage ([place]) 12/18/2017  . Syncope 04/10/2015  . Sepsis  secondary to UTI ([place]) 04/10/2015  . URI (upper respiratory infection) 04/10/2015  . UTI (lower urinary tract infection) 04/10/2015  . QT prolongation 10/12/2014  . Headache 09/02/2012  . Tricuspid valve regurgitation, moderate 08/18/2012  . Hypokalemia 08/16/2012  . Hyponatremia 08/16/2012  . Atrial fibrillation ([place])   . Hypertension   . Anxiety   . Mixed hyperlipidemia 09/11/2009  . GERD 08/30/2008  . CHEST PAIN 08/30/2008  . Coronary atherosclerosis 04/04/2008  . Syncope and collapse 04/04/2008    Past Surgical History:  Procedure Laterality Date  . ABDOMINAL HYSTERECTOMY    . ANKLE SURGERY  2001  . BREAST EXCISIONAL BIOPSY Right 1970  . BREAST EXCISIONAL BIOPSY Right   . BREAST EXCISIONAL BIOPSY Left   . CESAREAN SECTION    . CORONARY ANGIOPLASTY    . ESOPHAGOGASTRODUODENOSCOPY       OB History   No obstetric history on file.     Family History  Problem Relation Age of Onset  . Diabetes Mother   . Heart attack Mother   . Lung cancer Father   . Thyroid disease Sister   . Thyroid disease Sister   . Hypertension Sister   . Hypertension Sister   . Hypertension Sister   . Hypertension Sister   . Hypertension Brother   . Hypertension Brother   . Breast cancer Other 45  . Colon cancer Neg Hx   . Colon polyps Neg Hx   . Esophageal cancer Neg Hx   . Stroke Neg Hx     Social History   Tobacco Use  . Smoking status: Former Smoker    Types: Cigarettes  . Smokeless tobacco: Never Used  Substance Use Topics  . Alcohol use: No    Alcohol/week: 0.0 standard drinks  . Drug use: No    Home Medications Prior to Admission medications   Medication Sig Start Date End Date Taking? Authorizing Provider  acetaminophen (TYLENOL) 325 MG tablet Take 2 tablets (650 mg total) by mouth every 6 (six) hours. 01/18/18   Love, Ivan Anchors, PA-C  antiseptic oral rinse (BIOTENE) LIQD 15 mLs by Mouth Rinse route 2 (two) times daily.    [provider]    carboxymethylcellulose (REFRESH PLUS) 0.5 % SOLN Place 1 drop into both eyes as needed (dry eyes).    [provider]  cephALEXin (KEFLEX) 500 MG capsule Take 1 capsule (500 mg total) by mouth 2 (two) times daily. 03/12/18   Blanchie Dessert, MD  docusate sodium (COLACE) 100 MG capsule Take 1 capsule (100 mg total) by mouth 2 (two) times daily. 01/18/18   Love, Ivan Anchors, PA-C  irbesartan (AVAPRO) 150 MG tablet Take 1 tablet (150 mg total) by mouth daily. 01/19/18   Bary Leriche, PA-C  levETIRAcetam (KEPPRA) 500 MG tablet Take 1 tablet (500 mg total) by mouth 2 (two) times daily. 12/23/17   Costella, Vista Mink, PA-C  magnesium oxide (MAG-OX) 400 (241.3 Mg) MG tablet Take 0.5 tablets (200 mg total) by mouth 2 (two) times daily. 01/18/18   Love, Ivan Anchors, PA-C  Melatonin 5 MG TABS Take 5 mg by mouth daily.    [provider]  Menthol-Methyl Salicylate (MUSCLE RUB) 10-15 % CREA Apply 1 application topically every 6 (six) hours. To bilateral knees Patient taking differently: Apply 1 application topically 4 (four) times daily. To bilateral knees 01/18/18   Love, Ivan Anchors, PA-C  metoprolol tartrate (LOPRESSOR) 50 MG tablet Take 1 tablet (50 mg total) by mouth 2 (two) times daily. 01/18/18   Love, Ivan Anchors, PA-C  mouth rinse LIQD solution 15 mLs by Mouth Rinse route 2 (two) times daily. 01/18/18   Love, Ivan Anchors, PA-C  Multiple Vitamin (MULTIVITAMIN WITH MINERALS) TABS tablet Take 1 tablet by mouth daily. Patient not taking: Reported on 02/19/2018 01/19/18   Love, Ivan Anchors, PA-C  nitroGLYCERIN (NITROSTAT) 0.4 MG SL tablet Place 1 tablet (0.4 mg total) under the tongue every 5 (five) minutes as needed for chest pain (3 doses only). 01/18/18   Love, Ivan Anchors, PA-C  omeprazole (PRILOSEC) 20 MG capsule Take 20 mg by mouth daily.    [provider]  polyethylene glycol (MIRALAX / GLYCOLAX) packet Take 17 g by mouth daily. 01/19/18   Love, Ivan Anchors, PA-C  potassium chloride SA (K-DUR,KLOR-CON)  20 MEQ tablet Take 20 mEq by mouth daily.     [provider]  ranitidine (ZANTAC) 150 MG/10ML syrup Take 10 mLs (150 mg total) by mouth 2 (two) times daily. Patient not taking: Reported on 03/12/2018 01/18/18   Love, Ivan Anchors, PA-C  sertraline (ZOLOFT) 50 MG tablet Take 50 mg by mouth daily.    [provider]  simethicone (MYLICON) 80 MG chewable tablet Chew 1 tablet (80 mg total) by mouth 4 (four) times daily as needed for flatulence. 01/18/18   Love, Ivan Anchors, PA-C  tamsulosin (FLOMAX) 0.4 MG CAPS capsule Take 1 capsule (0.4 mg total) by mouth daily after supper. 01/18/18   Love, Ivan Anchors, PA-C  vitamin B-12 (CYANOCOBALAMIN) 1000 MCG tablet Take 1,000 mcg by mouth daily.    [provider]    Allergies    Amlodipine, Methyldopa, Actonel [risedronate sodium], Ciprofloxacin hcl, Hctz [hydrochlorothiazide], Hydralazine hcl, Lactose intolerance (gi), Lisinopril, Milk-related compounds, Oxycodone hcl, Spironolactone, Tape, and Valsartan  Review of Systems   Review of Systems Unable to review systems due to dementia and confusion Physical Exam Updated Vital Signs BP 133/74   Pulse 64   Resp 17   SpO2 97%   Physical Exam Vitals and nursing note reviewed.  Constitutional:      General: She is not in acute distress.    Appearance: She is well-developed. She is not diaphoretic.  HENT:     Head: Normocephalic and atraumatic.  Eyes:     General: No scleral icterus.    Conjunctiva/sclera: Conjunctivae normal.  Cardiovascular:     Rate and Rhythm: Normal rate and regular rhythm.     Heart sounds: Normal heart sounds. No murmur. No friction rub. No gallop.   Pulmonary:     Effort: Pulmonary effort is normal. No respiratory distress.     Breath sounds: Normal breath sounds.  Abdominal:     General: Bowel sounds are normal. There is no distension.  Palpations: Abdomen is soft. There is no mass.     Tenderness: There is no abdominal tenderness. There is no  guarding.  Musculoskeletal:     Cervical back: Normal range of motion.  Skin:    General: Skin is warm and dry.     Comments: Skin tear to the right upper extremity  Neurological:     Mental Status: She is alert. She is disoriented and confused.  Psychiatric:        Behavior: Behavior normal.     ED Results / Procedures / Treatments   Labs (all labs ordered are listed, but only abnormal results are displayed) Labs Reviewed  CBG MONITORING, ED - Abnormal; Notable for the following components:      Result Value   Glucose-Capillary 135 (*)    All other components within normal limits  URINALYSIS, ROUTINE W REFLEX MICROSCOPIC  CBC WITH DIFFERENTIAL/PLATELET  COMPREHENSIVE METABOLIC PANEL    EKG EKG Interpretation  Date/Time:  Sunday May 15 2019 13:55:46 EDT Ventricular Rate:  69 PR Interval:    QRS Duration: 110 QT Interval:  425 QTC Calculation: 456 R Axis:   -54 Text Interpretation: Sinus or ectopic atrial rhythm Atrial premature complex LAD, consider left anterior fascicular block Abnormal R-wave progression, early transition Confirmed by Quintella Reichert [phone]) on 05/15/2019 2:46:28 PM   Radiology No results found.  Procedures Procedures (including critical care time)  Medications Ordered in ED Medications - No data to display  ED Course  I have reviewed the triage vital signs and the nursing notes.  Pertinent labs & imaging results that were available during my care of the patient were reviewed by me and considered in my medical decision making (see chart for details).    MDM Rules/Calculators/A&P                      There is a 86 year old female here with episode of loss of consciousness and unresponsiveness lasting approximately 30 minutes.  CBC without abnormality.  CMP pending.  Urine without infection.  CBG is 135 and of insignificant value.  Patient's head CT shows no acute bleeds.  EKG shows normal sinus rhythm at a rate of 69.  She does not have any  evidence of arrhythmia.  Question if this may have been seizure-like activity like an absence seizure.  The patient's daughter is at bedside and very concerned about her.  Feel the patient will need admission.  Have given signout to PA [place] who will assume care of the patient  Final Clinical Impression(s) / ED Diagnoses Final diagnoses:  None    Rx / DC Orders ED Discharge Orders    None       Margarita Mail, PA-C 05/15/19 1646    Virgel Manifold, MD 05/16/19 [phone]

## 2019-05-15 NOTE — ED Provider Notes (Signed)
Care assumed from [place], PA-C at shift change with labs and imaging pending.   In brief, this patient is a 86 year old female past medical history of intracranial hemorrhage, pulmonary hypertension, recurrent syncope.  Patient's daughter provides most of the history.  She reports that today, the patient was sitting in a chair getting ready to shower and told her daughter that she felt like she was going to pass out.  The patient told her daughter that she was very hot and then the patient slumped to the right side.  Daughter reports that she kept her eyes open the entire time.  She reports that they waited for about 10 minutes and she still did not regain consciousness.  She states that she took a total of 30 minutes before he became more conscious again.   Physical Exam  BP (!) 159/83   Pulse 99   Temp 98.4 F (36.9 C)   Resp 16   Wt 63 kg   SpO2 98%   BMI 21.12 kg/m   Physical Exam  ED Course/Procedures     Procedures  MDM    PLAN: Patient pending lab work and imaging.  Given recurrent syncope episodes, recommends admission for observation.  MDM:  CBC without any significant leukocytosis or anemia.  CMP is unremarkable.  UA negative for any acute abnormalities.  CT head shows no acute intracranial abnormalities.  Given prolonged syncope without obvious cause, will plan for admission.  Discussed patient with Dr. Jonelle Sidle ([facility] who accepts patient for admission.   1. Syncope, unspecified syncope type      Portions of this note were generated with Dragon dictation software. Dictation errors may occur despite best attempts at proofreading.     Volanda Napoleon, PA-C 05/16/19 [place]    Virgel Manifold, MD 05/16/19 [phone]

## 2019-05-15 NOTE — ED Triage Notes (Signed)
Pt  Here from home with c/o syncopal episode while sitting in a chair , pt is confused at baseline according to family , pt has a skin tear to the right arm , cbg 135

## 2019-05-16 ENCOUNTER — Encounter (HOSPITAL_COMMUNITY): Payer: Self-pay | Admitting: Internal Medicine

## 2019-05-16 DIAGNOSIS — I2510 Atherosclerotic heart disease of native coronary artery without angina pectoris: Secondary | ICD-10-CM | POA: Diagnosis not present

## 2019-05-16 DIAGNOSIS — R55 Syncope and collapse: Secondary | ICD-10-CM

## 2019-05-16 DIAGNOSIS — Z90710 Acquired absence of both cervix and uterus: Secondary | ICD-10-CM | POA: Diagnosis not present

## 2019-05-16 DIAGNOSIS — F0280 Dementia in other diseases classified elsewhere without behavioral disturbance: Secondary | ICD-10-CM | POA: Diagnosis not present

## 2019-05-16 DIAGNOSIS — R569 Unspecified convulsions: Secondary | ICD-10-CM | POA: Diagnosis not present

## 2019-05-16 DIAGNOSIS — R001 Bradycardia, unspecified: Secondary | ICD-10-CM | POA: Diagnosis present

## 2019-05-16 DIAGNOSIS — Z9861 Coronary angioplasty status: Secondary | ICD-10-CM | POA: Diagnosis not present

## 2019-05-16 DIAGNOSIS — I4821 Permanent atrial fibrillation: Secondary | ICD-10-CM | POA: Diagnosis present

## 2019-05-16 DIAGNOSIS — Z8673 Personal history of transient ischemic attack (TIA), and cerebral infarction without residual deficits: Secondary | ICD-10-CM | POA: Diagnosis not present

## 2019-05-16 DIAGNOSIS — K219 Gastro-esophageal reflux disease without esophagitis: Secondary | ICD-10-CM | POA: Diagnosis present

## 2019-05-16 DIAGNOSIS — Z20822 Contact with and (suspected) exposure to covid-19: Secondary | ICD-10-CM | POA: Diagnosis present

## 2019-05-16 DIAGNOSIS — I10 Essential (primary) hypertension: Secondary | ICD-10-CM | POA: Diagnosis present

## 2019-05-16 DIAGNOSIS — E782 Mixed hyperlipidemia: Secondary | ICD-10-CM | POA: Diagnosis not present

## 2019-05-16 DIAGNOSIS — R0902 Hypoxemia: Secondary | ICD-10-CM | POA: Diagnosis present

## 2019-05-16 DIAGNOSIS — I272 Pulmonary hypertension, unspecified: Secondary | ICD-10-CM | POA: Diagnosis present

## 2019-05-16 DIAGNOSIS — F0281 Dementia in other diseases classified elsewhere with behavioral disturbance: Secondary | ICD-10-CM | POA: Diagnosis not present

## 2019-05-16 DIAGNOSIS — Z79899 Other long term (current) drug therapy: Secondary | ICD-10-CM | POA: Diagnosis not present

## 2019-05-16 DIAGNOSIS — Z66 Do not resuscitate: Secondary | ICD-10-CM | POA: Diagnosis present

## 2019-05-16 DIAGNOSIS — Z87891 Personal history of nicotine dependence: Secondary | ICD-10-CM | POA: Diagnosis not present

## 2019-05-16 DIAGNOSIS — G3183 Dementia with Lewy bodies: Secondary | ICD-10-CM | POA: Diagnosis not present

## 2019-05-16 DIAGNOSIS — I4891 Unspecified atrial fibrillation: Secondary | ICD-10-CM | POA: Diagnosis not present

## 2019-05-16 LAB — SARS CORONAVIRUS 2 (TAT 6-24 HRS): SARS Coronavirus 2: NEGATIVE

## 2019-05-16 LAB — CBC
HCT: 37.1 % (ref 36.0–46.0)
Hemoglobin: 12.4 g/dL (ref 12.0–15.0)
MCH: 30.9 pg (ref 26.0–34.0)
MCHC: 33.4 g/dL (ref 30.0–36.0)
MCV: 92.5 fL (ref 80.0–100.0)
Platelets: 178 10*3/uL (ref 150–400)
RBC: 4.01 MIL/uL (ref 3.87–5.11)
RDW: 13.1 % (ref 11.5–15.5)
WBC: 8.3 10*3/uL (ref 4.0–10.5)
nRBC: 0 % (ref 0.0–0.2)

## 2019-05-16 LAB — COMPREHENSIVE METABOLIC PANEL
ALT: 13 U/L (ref 0–44)
AST: 21 U/L (ref 15–41)
Albumin: 3.5 g/dL (ref 3.5–5.0)
Alkaline Phosphatase: 48 U/L (ref 38–126)
Anion gap: 10 (ref 5–15)
BUN: 11 mg/dL (ref 8–23)
CO2: 27 mmol/L (ref 22–32)
Calcium: 9.2 mg/dL (ref 8.9–10.3)
Chloride: 102 mmol/L (ref 98–111)
Creatinine, Ser: 0.73 mg/dL (ref 0.44–1.00)
GFR calc Af Amer: >60 mL/min (ref >60)
GFR calc non Af Amer: >60 mL/min (ref >60)
Glucose, Bld: 95 mg/dL (ref 70–99)
Potassium: 3.5 mmol/L (ref 3.5–5.1)
Sodium: 139 mmol/L (ref 135–145)
Total Bilirubin: 1.1 mg/dL (ref 0.3–1.2)
Total Protein: 6.3 g/dL — ABNORMAL LOW (ref 6.5–8.1)

## 2019-05-16 MED ORDER — OLANZAPINE 5 MG PO TBDP
2.5000 mg | ORAL_TABLET | Freq: Two times a day (BID) | ORAL | Status: DC | PRN
  Filled 2019-05-16 (×2): qty 0.5

## 2019-05-16 MED ORDER — METOPROLOL TARTRATE 25 MG PO TABS
25.0000 mg | ORAL_TABLET | Freq: Two times a day (BID) | ORAL | Status: DC
  Administered 2019-05-16: 25 mg via ORAL
  Filled 2019-05-16 (×2): qty 1

## 2019-05-16 NOTE — Evaluation (Signed)
Physical Therapy Evaluation Patient Details Name: [name] MRN: [id number] DOB: 03-18-1926 Today's Date: 05/16/2019   History of Present Illness  86 y.o. female with medical history significant of coronary artery disease, atrial fibrillation, GERD, previous syncope, pulmonary hypertension, previous intracranial hemorrhage, recurrent UTIs, dementia. Pt admitted to ED on 3/28 after prolonged syncopal episode today at home, AMS.  Clinical Impression   Pt presents with generalized weakness, impaired cognition vs baseline, poor sitting and standing balance with heavy posterior leaning, difficulty mobilizing, and decreased activity tolerance. Pt to benefit from acute PT to address deficits. Pt ambulated room distance with min+2 assist, pt with heavy posterior leaning that she was unable to correct. Per pt's daughter, at baseline pt is not this confused and mobilizes without physical assistance. PT recommending SNF level of care post-acutely. PT to progress mobility as tolerated, and will continue to follow acutely.      Follow Up Recommendations SNF;Supervision/Assistance - 24 hour    Equipment Recommendations  None recommended by PT    Recommendations for Other Services       Precautions / Restrictions Precautions Precautions: Fall Restrictions Weight Bearing Restrictions: No      Mobility  Bed Mobility Overal bed mobility: Needs Assistance Bed Mobility: Supine to Sit;Sit to Supine     Supine to sit: Mod assist;HOB elevated Sit to supine: Mod assist;HOB elevated   General bed mobility comments: mod assist for supine<>sit for trunk and LE management, scooting to and from EOB, positioning in bed with use of bed pads upon return to supine.  Transfers Overall transfer level: Needs assistance Equipment used: Rolling walker (2 wheeled) Transfers: Sit to/from Stand Sit to Stand: From elevated surface;Mod assist         General transfer comment: Mod assist for power up,  steadying, correcting posterior leaning. Sit to stand x2, from EOB and from [facility]. Extremely difficult to cue pt to sit on [facility] when  Ambulation/Gait Ambulation/Gait assistance: Min assist;+2 safety/equipment Gait Distance (Feet): 15 Feet Assistive device: Rolling walker (2 wheeled) Gait Pattern/deviations: Step-through pattern;Decreased stride length;Leaning posteriorly;Drifts right/left Gait velocity: decr   General Gait Details: Min +2 for steadying, guiding pt and RW, managing lines/leads, correcting heavy posterior leaning. Pt difficult to redirect.  Stairs            Wheelchair Mobility    Modified Rankin (Stroke Patients Only)       Balance Overall balance assessment: Needs assistance;History of Falls Sitting-balance support: No upper extremity supported Sitting balance-Leahy Scale: Poor Sitting balance - Comments: posterior leaning with feet off ground, unable to correct Postural control: Posterior lean Standing balance support: Bilateral upper extremity supported;During functional activity Standing balance-Leahy Scale: Poor Standing balance comment: heavy posterior leaning                             Pertinent Vitals/Pain Pain Assessment: No/denies pain    Home Living Family/patient expects to be discharged to:: Skilled nursing facility Living Arrangements: Children Available Help at Discharge: Family;Available 24 hours/day Type of Home: House Home Access: Stairs to enter;Ramped entrance     Home Layout: Bed/bath upstairs;Two level Home Equipment: Walker - 2 wheels;Cane - single point;Bedside commode;Shower seat;Other (comment);Wheelchair - manual Additional Comments: chair lift    Prior Function Level of Independence: Needs assistance   Gait / Transfers Assistance Needed: pt walking with RW PTA, ambulated to and from bathroom without assist  ADL's / Homemaking Assistance Needed: Pt receiving assist for washing  back when showering, dressing.  daughter cooks and [employer]   Dominant Hand: Right    Extremity/Trunk Assessment   Upper Extremity Assessment Upper Extremity Assessment: Generalized weakness    Lower Extremity Assessment Lower Extremity Assessment: Generalized weakness    Cervical / Trunk Assessment Cervical / Trunk Assessment: Kyphotic  Communication   Communication: HOH  Cognition Arousal/Alertness: Awake/alert Behavior During Therapy: WFL for tasks assessed/performed Overall Cognitive Status: History of cognitive impairments - at baseline Area of Impairment: Orientation;Attention;Memory;Following commands;Safety/judgement;Awareness;Problem solving                 Orientation Level: Disoriented to;Place;Time;Situation Current Attention Level: Focused Memory: Decreased short-term memory Following Commands: Follows one step commands inconsistently Safety/Judgement: Decreased awareness of safety;Decreased awareness of deficits Awareness: Intellectual Problem Solving: Slow processing;Decreased initiation;Difficulty sequencing;Requires verbal cues;Requires tactile cues General Comments: Pt with baseline of dementia, but per pt's daughter pt with significantly worse command following, safety at present vs baseline. Pt follows commands ~50% of the time, responding best to short, 3-word commands during mobility. Pt difficult to redirect when she is set on doing something, for example PT attempting to get pt to exit bed towards R but pt set on exiting bed toward L even though bedrails on that side were up. Multimodal cuing needed for sequencing tasks.      General Comments      Exercises     Assessment/Plan    PT Assessment Patient needs continued PT services  PT Problem List Decreased strength;Decreased mobility;Decreased safety awareness;Decreased cognition;Decreased balance;Decreased knowledge of use of DME;Decreased activity tolerance       PT Treatment Interventions Gait  training;DME instruction;Therapeutic activities;Therapeutic exercise;Patient/family education;Balance training;Stair training;Functional mobility training;Neuromuscular re-education    PT Goals (Current goals can be found in the Care Plan section)  Acute Rehab PT Goals Patient Stated Goal: keep pt safe PT Goal Formulation: With family Time For Goal Achievement: 05/30/19 Potential to Achieve Goals: Good    Frequency Min 2X/week   Barriers to discharge        Co-evaluation               AM-PAC PT "6 Clicks" Mobility  Outcome Measure Help needed turning from your back to your side while in a flat bed without using bedrails?: A Lot Help needed moving from lying on your back to sitting on the side of a flat bed without using bedrails?: A Lot Help needed moving to and from a bed to a chair (including a wheelchair)?: A Lot Help needed standing up from a chair using your arms (e.g., wheelchair or bedside chair)?: A Lot Help needed to walk in hospital room?: A Lot Help needed climbing 3-5 steps with a railing? : Total 6 Click Score: 11    End of Session Equipment Utilized During Treatment: Gait belt Activity Tolerance: Patient limited by fatigue Patient left: in bed;with call bell/phone within reach;with family/visitor present(bed alarm not set because daughter in room, told daughter to contact RN when she is leaving for bed alarm to be set) Nurse Communication: Mobility status PT Visit Diagnosis: Muscle weakness (generalized) (M62.81);Difficulty in walking, not elsewhere classified (R26.2)    Time: [id number] PT Time Calculation (min) (ACUTE ONLY): 20 min   Charges:   PT Evaluation $PT Eval Low Complexity: 1 Low          Makiah Clauson E, PT Acute Rehabilitation Services Pager [phone]  Office [phone]   Feliciana Narayan D Elonda Husky 05/16/2019, 5:17 PM

## 2019-05-16 NOTE — Consult Note (Addendum)
Cardiology Consultation:   Patient ID: [name]; [id number]; [birth year]   Admit date: 05/15/2019 Date of Consult: 05/16/2019  Primary Care Provider: Merrilee Seashore, MD Primary Cardiologist: No primary care provider on file.  Primary Electrophysiologist:  None   Patient Profile:   [name] is a 86 y.o. female with a hx of DES LAD 2006 w/ patent stent 2010, int risk MV 2014>>med rx, HTN, HLD, chronic Afib not anticoag 2nd hx spont ICH 12/18/2017, PAH & MR on echo 2016, GERD, syncope 02/2018, who is being seen today for the evaluation of syncope w/ tachybrady at the request of Dr Eliseo Squires.  History of Present Illness:   [name] was admitted [facility] 1/1-02/20/2018 with syncope.  It was brief and she did not fall.  She was seen by the cardiology team. It happened while she was on the toilet. It was suspected that the syncope was secondary to hypotension in the setting of a UTI and dehydration.  Vasovagal episode following bowel movement may have been contributory as well. She also had a recent episode of near syncope.    Her daughter is with her today, and provides most of the information.  [name] was in her usual state of health yesterday morning.  She lives with her daughter, who is her caregiver.  She was in the shower chair with a belt on.  All of a sudden, she slumped over.  She did not fall because of the belt.  The daughter could not wake her up.  Her eyes were open and she was breathing.  The daughter did not check her heart rate or blood pressure.  The daughter called her daughter, who came and was trying to wake her up with a sternal rub but was unsuccessful.  That is when they called EMS.  By EMS arrival, the patient was becoming responsive but seemed groggy and not really aware of her surroundings, not recognizing family.  In the ER, she was more confused than usual and asking repetitive questions, doing repetitive things.  However, she did not have any  specific complaints of pain.  She did not complain of any pain, including chest pain.  There was no shortness of breath.  There was no headache.  She did not complain of being dizzy or lightheaded when she fully woke.  She has never had an episode like this before.  She has had episodes where she was "out" for a few seconds, but always came around very quickly.  The frequency and chronicity of these episodes is unclear.  Since admission to the hospital, she has been more fidgety than usual, and keeps trying to get out of bed, take her socks on or off, etc. she does recognize her daughter and knows she is not at home, because she wants to go home.   Past Medical History:  Diagnosis Date  . Ankle fracture   . Anxiety   . Arthritis   . Barrett's esophagus   . Chronic atrial fibrillation    a. Pt previously declined DCCV.  Marland Kitchen Coronary artery disease    a. s/p Cypher DES to LAD in 2006;  b. Last LHC 02/2008:  pLAD 30%, mLAD stent patent, pOM 50%, EF 65%;  c. Lexiscan Myoview 7/14:  Intermediate risk, dist ant and inf-lat ischemia, EF 66% - patient declined cath and elected medical therapy.  Marland Kitchen GERD (gastroesophageal reflux disease)   . Hiatal hernia   . Hx of echocardiogram    Echocardiogram 08/17/12: EF 55-60%,  MAC, mild MR, mild LAE, mild RVE, mild to moderate RAE, moderate TR, mildly increased pulmonary artery systolic pressures  . Hyperlipidemia   . Hypertension   . Mitral regurgitation    a. Mod by echo 10/2014.  . Obesity   . Pulmonary hypertension ([place])    a. Mod-severely elevated PA pressures by echo 10/2014.  Marland Kitchen QT prolongation    a. During 10/2014 admit, QTC 529m.  . Tricuspid regurgitation    a. Mod by echo 10/2014.  .Marland KitchenUTI (lower urinary tract infection) 03/2015    Past Surgical History:  Procedure Laterality Date  . ABDOMINAL HYSTERECTOMY    . ANKLE SURGERY  2001  . BREAST EXCISIONAL BIOPSY Right 1970  . BREAST EXCISIONAL BIOPSY Right   . BREAST EXCISIONAL BIOPSY Left   .  CESAREAN SECTION    . CORONARY ANGIOPLASTY    . ESOPHAGOGASTRODUODENOSCOPY       Prior to Admission medications   Medication Sig Start Date End Date Taking? Authorizing Provider  acetaminophen (TYLENOL) 500 MG tablet Take 500 mg by mouth at bedtime.   Yes [provider]  irbesartan (AVAPRO) 150 MG tablet Take 1 tablet (150 mg total) by mouth daily. Patient taking differently: Take 150 mg by mouth in the morning.  01/19/18  Yes Love, PIvan Anchors PA-C  levETIRAcetam (KEPPRA) 500 MG tablet Take 1 tablet (500 mg total) by mouth 2 (two) times daily. Patient taking differently: Take 500 mg by mouth in the morning.  12/23/17  Yes Costella, VVista Mink PA-C  Melatonin 5 MG TABS Take 5 mg by mouth at bedtime as needed (for sleep/anxiety).    Yes [provider]  metoprolol tartrate (LOPRESSOR) 50 MG tablet Take 1 tablet (50 mg total) by mouth 2 (two) times daily. Patient taking differently: Take 50 mg by mouth in the morning.  01/18/18  Yes Love, PIvan Anchors PA-C  nitroGLYCERIN (NITROSTAT) 0.4 MG SL tablet Place 1 tablet (0.4 mg total) under the tongue every 5 (five) minutes as needed for chest pain (3 doses only). Patient taking differently: Place 0.4 mg under the tongue every 5 (five) minutes x 3 doses as needed for chest pain.  01/18/18  Yes Love, PIvan Anchors PA-C  omeprazole (PRILOSEC OTC) 20 MG tablet Take 20 mg by mouth daily before breakfast.   Yes [provider]  potassium chloride SA (K-DUR,KLOR-CON) 20 MEQ tablet Take 20 mEq by mouth daily.    Yes [provider]  sertraline (ZOLOFT) 50 MG tablet Take 50 mg by mouth daily.   Yes [provider]  acetaminophen (TYLENOL) 325 MG tablet Take 2 tablets (650 mg total) by mouth every 6 (six) hours. Patient not taking: Reported on 05/15/2019 01/18/18   Love, PIvan Anchors PA-C  cephALEXin (KEFLEX) 500 MG capsule Take 1 capsule (500 mg total) by mouth 2 (two) times daily. Patient not taking: Reported on 05/15/2019 03/12/18    PBlanchie Dessert MD  docusate sodium (COLACE) 100 MG capsule Take 1 capsule (100 mg total) by mouth 2 (two) times daily. Patient not taking: Reported on 05/15/2019 01/18/18   Love, PIvan Anchors PA-C  magnesium oxide (MAG-OX) 400 (241.3 Mg) MG tablet Take 0.5 tablets (200 mg total) by mouth 2 (two) times daily. Patient not taking: Reported on 05/15/2019 01/18/18   Love, PIvan Anchors PA-C  Menthol-Methyl Salicylate (MUSCLE RUB) 10-15 % CREA Apply 1 application topically every 6 (six) hours. To bilateral knees Patient not taking: Reported on 05/15/2019 01/18/18   LBary Leriche PA-C  mouth rinse LIQD solution 15 mLs by Mouth Rinse route 2 (two) times daily. Patient not taking: Reported on 05/15/2019 01/18/18   Bary Leriche, PA-C  Multiple Vitamin (MULTIVITAMIN WITH MINERALS) TABS tablet Take 1 tablet by mouth daily. Patient not taking: Reported on 05/15/2019 01/19/18   Love, Ivan Anchors, PA-C  polyethylene glycol [facility] / GLYCOLAX) packet Take 17 g by mouth daily. Patient not taking: Reported on 05/15/2019 01/19/18   Love, Ivan Anchors, PA-C  ranitidine (ZANTAC) 150 MG/10ML syrup Take 10 mLs (150 mg total) by mouth 2 (two) times daily. Patient not taking: Reported on 05/15/2019 01/18/18   Love, Ivan Anchors, PA-C  simethicone (MYLICON) 80 MG chewable tablet Chew 1 tablet (80 mg total) by mouth 4 (four) times daily as needed for flatulence. Patient not taking: Reported on 05/15/2019 01/18/18   Bary Leriche, PA-C  tamsulosin (FLOMAX) 0.4 MG CAPS capsule Take 1 capsule (0.4 mg total) by mouth daily after supper. Patient not taking: Reported on 05/15/2019 01/18/18   Bary Leriche, PA-C    Inpatient Medications: Scheduled Meds: . irbesartan  150 mg Oral Daily  . levETIRAcetam  500 mg Oral Daily  . metoprolol tartrate  50 mg Oral Daily  . pantoprazole  40 mg Oral QAC breakfast  . sertraline  50 mg Oral Daily   Continuous Infusions: . sodium chloride 75 mL/hr at 05/15/19 2134   PRN Meds: melatonin  Allergies:      Allergies  Allergen Reactions  . Amlodipine Shortness Of Breath, Swelling and Other (See Comments)    Edema in legs  . Methyldopa Shortness Of Breath and Swelling  . Shellfish-Derived Products Other (See Comments)    Syncopal episode  . Tape Itching and Other (See Comments)    Also TEARS THE SKIN, SO PLEASE USE AN ALTERNATIVE!!  Marland Kitchen Actonel [Risedronate Sodium] Other (See Comments)    Caused a lump to come up in throat and caused heartburn  . Ciprofloxacin Hcl Other (See Comments)    Confusion   . Gas-X [Simethicone] Itching  . Hydralazine Hcl Other (See Comments)    Unknown reaction, per patient   . Lactose Intolerance (Gi) Diarrhea  . Lisinopril Cough  . Milk-Related Compounds Diarrhea  . Oxycodone Hcl Other (See Comments)    Hallucinations   . Pneumococcal Vaccines Swelling and Other (See Comments)    Arm swells  . Valsartan Other (See Comments)    Unknown reaction, per pt  . Cozaar [Losartan] Rash  . Digitek [Digoxin] Rash  . Felodipine Rash  . Hctz [Hydrochlorothiazide] Itching and Rash    Pt reports causing "itching under the skin"  . Simvastatin Rash  . Spironolactone Itching and Rash    Social History:   Social History   Socioeconomic History  . Marital status: Widowed    Spouse name: Not on file  . Number of children: 1  . Years of education: Not on file  . Highest education level: Not on file  Occupational History  . Occupation: Retired  Tobacco Use  . Smoking status: Former Smoker    Types: Cigarettes  . Smokeless tobacco: Never Used  Substance and Sexual Activity  . Alcohol use: No    Alcohol/week: 0.0 standard drinks  . Drug use: No  . Sexual activity: Never  Other Topics Concern  . Not on file  Social History Narrative  . Not on file   Social Determinants of Health   Financial Resource Strain:   . Difficulty of Paying Living Expenses:  Food Insecurity:   . Worried About Charity fundraiser in the Last Year:   . Arboriculturist in the  Last Year:   Transportation Needs:   . Film/video editor (Medical):   Marland Kitchen Lack of Transportation (Non-Medical):   Physical Activity:   . Days of Exercise per Week:   . Minutes of Exercise per Session:   Stress:   . Feeling of Stress :   Social Connections:   . Frequency of Communication with Friends and Family:   . Frequency of Social Gatherings with Friends and Family:   . Attends Religious Services:   . Active Member of Clubs or Organizations:   . Attends Archivist Meetings:   Marland Kitchen Marital Status:   Intimate Partner Violence:   . Fear of Current or Ex-Partner:   . Emotionally Abused:   Marland Kitchen Physically Abused:   . Sexually Abused:     Family History:   Family History  Problem Relation Age of Onset  . Diabetes Mother   . Heart attack Mother   . Lung cancer Father   . Thyroid disease Sister   . Thyroid disease Sister   . Hypertension Sister   . Hypertension Sister   . Hypertension Sister   . Hypertension Sister   . Hypertension Brother   . Hypertension Brother   . Breast cancer Other 45  . Colon cancer Neg Hx   . Colon polyps Neg Hx   . Esophageal cancer Neg Hx   . Stroke Neg Hx    Family Status:  Family Status  Relation Name Status  . Mother  Deceased  . Father  Deceased  . Sister  Alive  . Sister  Alive  . Sister  Alive  . Sister  Alive  . Sister  Alive  . Sister  Alive  . Brother  Alive  . Brother  Alive  . MGM  Deceased  . MGF  Deceased  . PGM  Deceased  . PGF  Deceased  . Sister  (Not Specified)  . Sister  (Not Specified)  . Sister  (Not Specified)  . Sister  (Not Specified)  . Sister  (Not Specified)  . Sister  (Not Specified)  . Brother  (Not Specified)  . Brother  (Not Specified)  . Other Niece (Not Specified)  . Neg Hx  (Not Specified)    ROS:  Please see the history of present illness.  All other ROS reviewed and negative.     Physical Exam/Data:   Vitals:   05/15/19 2140 05/15/19 2142 05/16/19 0435 05/16/19 1026  BP: (!)  140/111 (!) 159/83 (!) 152/87   Pulse: 76 99 88   Resp:   19   Temp:   98.5 F (36.9 C) 98 F (36.7 C)  SpO2: 100% 98% 98%   Weight:        Intake/Output Summary (Last 24 hours) at 05/16/2019 1448 Last data filed at 05/16/2019 0920 Gross per 24 hour  Intake 931.7 ml  Output 800 ml  Net 131.7 ml    Last 3 Weights 05/15/2019 02/17/2018 02/17/2018  Weight (lbs) 138 lb 14.2 oz 157 lb 3.2 oz 170 lb  Weight (kg) 63 kg 71.305 kg 77.111 kg     Body mass index is 21.12 kg/m.   General:  Well nourished, well developed, female in no acute distress HEENT: normal Lymph: no adenopathy Neck: JVD -not elevated Endocrine:  No thryomegaly Vascular: No carotid bruits; 4/4 extremity pulses 2+  Cardiac:  normal S1, S2; irregular rate and rhythm; no murmur Lungs:  clear bilaterally, no wheezing, rhonchi or rales  Abd: soft, nontender, no hepatomegaly  Ext: no edema Musculoskeletal:  No deformities, BUE and BLE strength normal and equal Skin: warm and dry  Neuro:  CNs 2-12 intact, no focal abnormalities noted, awake and oriented x2 Psych:  Normal affect, but gets anxious easily  EKG:  The EKG was personally reviewed and demonstrates: 3/28 ECG is atrial fib, heart rate 69, no acute ischemic changes Telemetry:  Telemetry was personally reviewed and demonstrates: Atrial fib, heart rate generally controlled now, her heart rate was elevated at times and bradycardic at times, lowest heart rate 44, highest 142   CV studies:   ECHO: 10/31/2014 - Left ventricle: The cavity size was normal. Wall thickness was  increased in a pattern of mild LVH. Systolic function was normal.  The estimated ejection fraction was in the range of 55% to 60%.  Wall motion was normal; there were no regional wall motion  abnormalities.  - Mitral valve: There was moderate regurgitation.  - Left atrium: The atrium was mildly dilated.  - Right atrium: The atrium was mildly dilated.  - Tricuspid valve: There was moderate  regurgitation.  - Pulmonary arteries: Systolic pressure was moderately to severely  increased. PA peak pressure: 56 mm Hg (S).   Impressions:   - Normal LV function; biatrial enlargement; moderate MR; moderate  TR; moderate to severe elevation in pulmonary pressure.    Myoview: 2014 Stress test abnormal with ant and inf-lat ischemia   Laboratory Data:   Chemistry Recent Labs  Lab 05/15/19 1615 05/16/19 0558  NA 139 139  K 4.1 3.5  CL 103 102  CO2 24 27  GLUCOSE 129* 95  BUN 18 11  CREATININE 0.86 0.73  CALCIUM 9.0 9.2  GFRNONAA 59* >60  GFRAA >60 >60  ANIONGAP 12 10    Lab Results  Component Value Date   ALT 13 05/16/2019   AST 21 05/16/2019   ALKPHOS 48 05/16/2019   BILITOT 1.1 05/16/2019   Hematology Recent Labs  Lab 05/15/19 1615 05/16/19 0558  WBC 7.4 8.3  RBC 3.99 4.01  HGB 12.3 12.4  HCT 37.4 37.1  MCV 93.7 92.5  MCH 30.8 30.9  MCHC 32.9 33.4  RDW 13.2 13.1  PLT 150 178   Cardiac Enzymes High Sensitivity Troponin:  No results for input(s): TROPONINIHS in the last 720 hours.    BNPNo results for input(s): BNP, PROBNP in the last 168 hours.  DDimer No results for input(s): DDIMER in the last 168 hours. TSH:  Lab Results  Component Value Date   TSH 2.265 12/26/2017   Lipids: Lab Results  Component Value Date   CHOL 170 02/19/2018   HDL 48 02/19/2018   LDLCALC 104 (H) 02/19/2018   TRIG 91 02/19/2018   CHOLHDL 3.5 02/19/2018   HgbA1c:No results found for: HGBA1C Magnesium:  Magnesium  Date Value Ref Range Status  01/06/2018 1.9 1.7 - 2.4 mg/dL Final    Comment:    Performed at [facility], [place] [address], [place], [place] [address]     Radiology/Studies:  CT Head Wo Contrast  Result Date: 05/15/2019 CLINICAL DATA:  Syncope. Confusion. EXAM: CT HEAD WITHOUT CONTRAST TECHNIQUE: Contiguous axial images were obtained from the base of the skull through the vertex without intravenous contrast. COMPARISON:  Brain CT  03/12/2018 FINDINGS: Brain: Ventricles and sulci are prominent compatible with atrophy. Periventricular and subcortical white matter  hypodensities compatible with chronic microvascular ischemic changes. No evidence for acute cortically based infarct, intracranial hemorrhage, mass lesion or mass-effect. Vascular: Unremarkable Skull: Intact. Sinuses/Orbits: Paranasal sinuses are well aerated. Mastoid air cells are unremarkable. Other: None. IMPRESSION: No acute intracranial process. Atrophy and chronic microvascular ischemic changes. Electronically Signed   By: Lovey Newcomer M.D.   On: 05/15/2019 15:22    Assessment and Plan:   1.  Syncope: -No clear cause based on telemetry review -As she takes her metoprolol 50 mg in the morning, she may have had a low heart rate -Discuss with MD and with the daughter if the patient would be able to keep a monitor on -At this point, no indication for pacemaker -Consider changing beta-blocker dosage -Consider neuro consult  2.  CAD: -No history of ischemic symptoms -Continue beta-blocker -She is not on ASA because of the history of ICH. -No med changes or further testing is indicated  3.  Hypertension: -Prior to admission she was taking metoprolol 50 mg daily and irbesartan 150 mg daily -Currently, she is on her home medications and her systolic blood pressure range has been 133-174 -Because of her history of intracranial hemorrhage, permissive hypertension may be appropriate  4.  Permanent atrial fibrillation -Although her heart rate range has been 44-142, the bradycardia was not sustained. -There have been no heart rates low enough to explain her symptoms -Consider monitor, but I am not sure the patient would tolerate it  Otherwise, per IM Principal Problem:   Syncope and collapse Active Problems:   Mixed hyperlipidemia   GERD   Atrial fibrillation (HCC)   Coronary artery disease involving native coronary artery of native heart without angina  pectoris   Benign essential HTN     For questions or updates, please contact [facility] Please consult [email or web address] for contact info under Cardiology/STEMI.   Jonetta Speak, PA-C  05/16/2019 2:48 PM  Attending Note:   The patient was seen and examined.  Agree with assessment and plan as noted above.  Changes made to the above note as needed.  Patient seen and independently examined with Rosaria Ferries,  PA .   We discussed all aspects of the encounter. I agree with the assessment and plan as stated above.  1.  Episode of syncope: It is quite likely that Mr. Shattuck had profound bradycardia as an explanation to her episode of syncope.  She is on metoprolol 50 mg just once a day.  She has had some bradycardia seen on the monitor with a heart rate reported at 44.  It is possible that she could have had a heart rate in the 20s or 30s for 10 or 20 minutes to explain this episode of unresponsiveness.  Her daughter confirms that she was fairly cyanotic during the episode.  She also has episodes of tachycardia so I do not think that we can stop the beta-blocker completely.  We will try cutting the beta-blocker in half and giving it to her twice daily instead of just daily.  CT of the head was negative for acute stroke.  I discussed the possibility of repeating the CT scan with Dr. Lucianne Lei but we both agree that while it is likely that she may have had a TIA, the only remedy for that would be to start her on anticoagulation and the patient is not a candidate for anticoagulation because of her history of a spontaneous intracranial bleed.  We also discussed placing a monitor on her.  The patient is fairly  agitated so not sure that she will would leave the monitor on adequately enough to get a good tracing.  We will continue to monitor her for several days and see how she does.  2.  Coronary artery disease: Fortunately she is not having any episodes of chest discomfort.  Continue current  medications.  3.  Hypertension: Blood pressure seems fairly well controlled.   I have spent a total of 40 minutes with patient reviewing hospital  notes , telemetry, EKGs, labs and examining patient as well as establishing an assessment and plan that was discussed with the patient. > 50% of time was spent in direct patient care.    Thayer Headings, Brooke Bonito., MD, [facility] 05/16/2019, 4:20 PM [address]  [place] Pager [phone]

## 2019-05-16 NOTE — Progress Notes (Signed)
Progress Note    [name]  [id number] DOB: [birth year]  DOA: 05/15/2019 PCP: Merrilee Seashore, MD    Brief Narrative:     Medical records reviewed and are as summarized below:  [name] is an 86 y.o. female with medical history significant of coronary artery disease, atrial fibrillation, GERD, previous syncope, pulmonary hypertension, previous intracranial hemorrhage, recurrent UTIs who is a DNR and lives at home.  Patient came in to the ER after prolonged syncopal episode today at home.  Daughter reported patient being out almost 30 minutes.  She was reportedly sitting in a chair getting ready to shower when she told her daughter she was about to pass out.  She felt very hot and then slumped to the right side.  Patient's eyes were open the entire time.  They were waiting for her for more than 10 minutes but was still out.  She finally came around 30 minutes later and was fully conscious.  Patient appears to be back to her baseline since then.  Assessment/Plan:   Principal Problem:   Syncope and collapse Active Problems:   Mixed hyperlipidemia   GERD   Atrial fibrillation (HCC)   Coronary artery disease involving native coronary artery of native heart without angina pectoris   Benign essential HTN   Syncope/collapse -Possibilities include TIA/CVAs as patient has atrial fibrillation and is not on an anticoagulant due to intracranial bleed vs tacky/brady syndrome (patient's heart rate between 44 and 142 here) -Daughter reports patient has several episodes of this including times when she is unable to speak or get her words out correctly  Paroxysmal A. fib with episodes of RVR and episodes of bradycardia -Resume home meds -Patient is not on blood thinner due to previous intracranial bleed -Per chart review patient cannot have blood thinners going forward  Hypertension -Resume home meds  Dementia -Review shows that family states patient is confused at  baseline  Family Communication/Anticipated D/C date and plan/Code Status   DVT prophylaxis: CDs Code Status: DNR Family Communication: Spoke with daughter Disposition Plan: Patient lives at home with her daughter.  Will get PT consult.  Have also consulted cardiology and await recommendations if any for medication changes.  Not sure she would qualify based on functional status for pacemaker if indicated.  If unable to place patient on blood thinner spoke with daughter about these episodes could continue to occur and could lead to further TIA/embolic strokes   Medical Consultants:    Cardiology     Subjective:   Patient with no current complaints.  Trying to get out of bed  Objective:    Vitals:   05/15/19 2137 05/15/19 2140 05/15/19 2142 05/16/19 0435  BP: (!) 157/96 (!) 140/111 (!) 159/83 (!) 152/87  Pulse: 98 76 99 88  Resp:    19  Temp:    98.5 F (36.9 C)  SpO2: 94% 100% 98% 98%  Weight:        Intake/Output Summary (Last 24 hours) at 05/16/2019 0856 Last data filed at 05/16/2019 0600 Gross per 24 hour  Intake 691.7 ml  Output 600 ml  Net 91.7 ml   Filed Weights   05/15/19 2030  Weight: 63 kg    Exam: In bed, no acute distress Irregular rhythm and fast Patient pleasant but confused and impulsive No increased work of breathing, no wheezing  Data Reviewed:   I have personally reviewed following labs and imaging studies:  Labs: Labs show the following:   Basic  Metabolic Panel: Recent Labs  Lab 05/15/19 1615 05/16/19 0558  NA 139 139  K 4.1 3.5  CL 103 102  CO2 24 27  GLUCOSE 129* 95  BUN 18 11  CREATININE 0.86 0.73  CALCIUM 9.0 9.2   GFR CrCl cannot be calculated (Unknown ideal weight.). Liver Function Tests: Recent Labs  Lab 05/15/19 1615 05/16/19 0558  AST 25 21  ALT 14 13  ALKPHOS 43 48  BILITOT 0.9 1.1  PROT 6.1* 6.3*  ALBUMIN 3.6 3.5   No results for input(s): LIPASE, AMYLASE in the last 168 hours. No results for input(s):  AMMONIA in the last 168 hours. Coagulation profile No results for input(s): INR, PROTIME in the last 168 hours.  CBC: Recent Labs  Lab 05/15/19 1615 05/16/19 0558  WBC 7.4 8.3  NEUTROABS 4.0  --   HGB 12.3 12.4  HCT 37.4 37.1  MCV 93.7 92.5  PLT 150 178   Cardiac Enzymes: No results for input(s): CKTOTAL, CKMB, CKMBINDEX, TROPONINI in the last 168 hours. BNP (last 3 results) No results for input(s): PROBNP in the last 8760 hours. CBG: Recent Labs  Lab 05/15/19 1341  GLUCAP 135*   D-Dimer: No results for input(s): DDIMER in the last 72 hours. Hgb A1c: No results for input(s): HGBA1C in the last 72 hours. Lipid Profile: No results for input(s): CHOL, HDL, LDLCALC, TRIG, CHOLHDL, LDLDIRECT in the last 72 hours. Thyroid function studies: No results for input(s): TSH, T4TOTAL, T3FREE, THYROIDAB in the last 72 hours.  Invalid input(s): FREET3 Anemia work up: No results for input(s): VITAMINB12, FOLATE, FERRITIN, TIBC, IRON, RETICCTPCT in the last 72 hours. Sepsis Labs: Recent Labs  Lab 05/15/19 1615 05/16/19 0558  WBC 7.4 8.3    Microbiology Recent Results (from the past 240 hour(s))  SARS CORONAVIRUS 2 (TAT 6-24 HRS) Nasopharyngeal Nasopharyngeal Swab     Status: None   Collection Time: 05/15/19  7:48 PM   Specimen: Nasopharyngeal Swab  Result Value Ref Range Status   SARS Coronavirus 2 NEGATIVE NEGATIVE Final    Comment: (NOTE) SARS-CoV-2 target nucleic acids are NOT DETECTED. The SARS-CoV-2 RNA is generally detectable in upper and lower respiratory specimens during the acute phase of infection. Negative results do not preclude SARS-CoV-2 infection, do not rule out co-infections with other pathogens, and should not be used as the sole basis for treatment or other patient management decisions. Negative results must be combined with clinical observations, patient history, and epidemiological information. The expected result is Negative. Fact Sheet for  Patients: SugarRoll.be Fact Sheet for Healthcare Providers: https://www.woods-mathews.com/ This test is not yet approved or cleared by the [place] FDA and  has been authorized for detection and/or diagnosis of SARS-CoV-2 by FDA under an Emergency Use Authorization (EUA). This EUA will remain  in effect (meaning this test can be used) for the duration of the COVID-19 declaration under Section 56 4(b)(1) of the Act, 21 U.S.C. section 360bbb-3(b)(1), unless the authorization is terminated or revoked sooner. Performed at [facility], [place] [address], [place], [place] [address]     Procedures and diagnostic studies:  CT Head Wo Contrast  Result Date: 05/15/2019 CLINICAL DATA:  Syncope. Confusion. EXAM: CT HEAD WITHOUT CONTRAST TECHNIQUE: Contiguous axial images were obtained from the base of the skull through the vertex without intravenous contrast. COMPARISON:  Brain CT 03/12/2018 FINDINGS: Brain: Ventricles and sulci are prominent compatible with atrophy. Periventricular and subcortical white matter hypodensities compatible with chronic microvascular ischemic changes. No evidence for acute  cortically based infarct, intracranial hemorrhage, mass lesion or mass-effect. Vascular: Unremarkable Skull: Intact. Sinuses/Orbits: Paranasal sinuses are well aerated. Mastoid air cells are unremarkable. Other: None. IMPRESSION: No acute intracranial process. Atrophy and chronic microvascular ischemic changes. Electronically Signed   By: Lovey Newcomer M.D.   On: 05/15/2019 15:22    Medications:   . enoxaparin (LOVENOX) injection  40 mg Subcutaneous Q24H  . irbesartan  150 mg Oral Daily  . levETIRAcetam  500 mg Oral Daily  . metoprolol tartrate  50 mg Oral Daily  . pantoprazole  40 mg Oral QAC breakfast  . sertraline  50 mg Oral Daily   Continuous Infusions: . sodium chloride 75 mL/hr at 05/15/19 2134     LOS: 0 days   Geradine Girt  [facility]   How to contact the [facility] Attending or Consulting provider [place] or covering provider during after hours [place], for this patient?  1. Check the care team in [facility] and look for a) attending/consulting [facility] provider listed and b) the [facility] team listed 2. Log into [email or web address] and use [place]'s universal password to access. If you do not have the password, please contact the hospital operator. 3. Locate the [facility] provider you are looking for under [facility] and page to a number that you can be directly reached. 4. If you still have difficulty reaching the provider, please page the [facility] (Director on Call) for the Hospitalists listed on amion for assistance.  05/16/2019, 8:56 AM

## 2019-05-17 ENCOUNTER — Inpatient Hospital Stay (HOSPITAL_COMMUNITY)
Admit: 2019-05-17 | Discharge: 2019-05-17 | Disposition: A | Discharge status: Home / Self Care | Payer: Medicare Other | Attending: Internal Medicine | Admitting: Internal Medicine

## 2019-05-17 DIAGNOSIS — I2510 Atherosclerotic heart disease of native coronary artery without angina pectoris: Secondary | ICD-10-CM

## 2019-05-17 DIAGNOSIS — I4891 Unspecified atrial fibrillation: Secondary | ICD-10-CM

## 2019-05-17 DIAGNOSIS — F0281 Dementia in other diseases classified elsewhere with behavioral disturbance: Secondary | ICD-10-CM

## 2019-05-17 DIAGNOSIS — E782 Mixed hyperlipidemia: Secondary | ICD-10-CM

## 2019-05-17 DIAGNOSIS — G3183 Dementia with Lewy bodies: Principal | ICD-10-CM

## 2019-05-17 MED ORDER — METOPROLOL SUCCINATE ER 25 MG PO TB24
25.0000 mg | ORAL_TABLET | Freq: Every day | ORAL | Status: DC
  Administered 2019-05-17: 25 mg via ORAL
  Filled 2019-05-17: qty 1

## 2019-05-17 NOTE — Progress Notes (Signed)
Progress Note  Patient Name: [name] Date of Encounter: 05/17/2019  Primary Cardiologist:    [name]   Subjective   86 y.o. female with a hx of DES LAD 2006 w/ patent stent 2010, int risk MV 2014>>med rx, HTN, HLD, chronic Afib not anticoag 2nd hx spont ICH 12/18/2017, PAH & MR on echo 2016, GERD, syncope 02/2018, who is being seen today for the evaluation of syncope w/ tachybrady at the request of Dr Eliseo Squires.   Inpatient Medications    Scheduled Meds: . irbesartan  150 mg Oral Daily  . levETIRAcetam  500 mg Oral Daily  . metoprolol tartrate  25 mg Oral BID  . pantoprazole  40 mg Oral QAC breakfast  . sertraline  50 mg Oral Daily   Continuous Infusions: . sodium chloride 75 mL/hr at 05/17/19 0128   PRN Meds: melatonin, OLANZapine zydis   Vital Signs    Vitals:   05/16/19 1651 05/16/19 2115 05/16/19 2115 05/17/19 0607  BP: (!) 168/71 (!) 126/91 (!) 126/91 (!) 143/68  Pulse: 68 85 85 74  Resp: 18 19 19 14   Temp: 97.7 F (36.5 C) (!) 97.4 F (36.3 C) (!) 97.4 F (36.3 C) 97.6 F (36.4 C)  TempSrc: Oral Oral Oral Oral  SpO2: 98% 98% 98% 99%  Weight:        Intake/Output Summary (Last 24 hours) at 05/17/2019 0949 Last data filed at 05/17/2019 0557 Gross per 24 hour  Intake 712.27 ml  Output 0 ml  Net 712.27 ml   Last 3 Weights 05/15/2019 02/17/2018 02/17/2018  Weight (lbs) 138 lb 14.2 oz 157 lb 3.2 oz 170 lb  Weight (kg) 63 kg 71.305 kg 77.111 kg      Telemetry    Afib.  HR is well controlled.  - Personally Reviewed  ECG     - Personally Reviewed  Physical Exam   GEN:  elderly female,   Aggitated. Non-communicative   Neck: No JVD Cardiac:  irreg. Irreg.  Respiratory: Clear to auscultation bilaterally. GI: Soft, nontender, non-distended  MS: No edema; No deformity. Neuro:  Nonfocal  Psych: Normal affect   Labs    High Sensitivity Troponin:  No results for input(s): TROPONINIHS in the last 720 hours.    Chemistry Recent Labs  Lab  05/15/19 1615 05/16/19 0558  NA 139 139  K 4.1 3.5  CL 103 102  CO2 24 27  GLUCOSE 129* 95  BUN 18 11  CREATININE 0.86 0.73  CALCIUM 9.0 9.2  PROT 6.1* 6.3*  ALBUMIN 3.6 3.5  AST 25 21  ALT 14 13  ALKPHOS 43 48  BILITOT 0.9 1.1  GFRNONAA 59* >60  GFRAA >60 >60  ANIONGAP 12 10     Hematology Recent Labs  Lab 05/15/19 1615 05/16/19 0558  WBC 7.4 8.3  RBC 3.99 4.01  HGB 12.3 12.4  HCT 37.4 37.1  MCV 93.7 92.5  MCH 30.8 30.9  MCHC 32.9 33.4  RDW 13.2 13.1  PLT 150 178    BNPNo results for input(s): BNP, PROBNP in the last 168 hours.   DDimer No results for input(s): DDIMER in the last 168 hours.   Radiology    CT Head Wo Contrast  Result Date: 05/15/2019 CLINICAL DATA:  Syncope. Confusion. EXAM: CT HEAD WITHOUT CONTRAST TECHNIQUE: Contiguous axial images were obtained from the base of the skull through the vertex without intravenous contrast. COMPARISON:  Brain CT 03/12/2018 FINDINGS: Brain: Ventricles and sulci are prominent compatible with atrophy. Periventricular  and subcortical white matter hypodensities compatible with chronic microvascular ischemic changes. No evidence for acute cortically based infarct, intracranial hemorrhage, mass lesion or mass-effect. Vascular: Unremarkable Skull: Intact. Sinuses/Orbits: Paranasal sinuses are well aerated. Mastoid air cells are unremarkable. Other: None. IMPRESSION: No acute intracranial process. Atrophy and chronic microvascular ischemic changes. Electronically Signed   By: Lovey Newcomer M.D.   On: 05/15/2019 15:22    Cardiac Studies      Patient Profile     86 y.o. female with severe dementia ,   Admitted with episode of syncope   Assessment & Plan    1.  Syncope: It is quite likely that [name] had an episode of prolonged bradycardia.  She seems to be better on metoprolol 25 mg twice a day rather than 50 mg once a day.  I think we could even lower her dose further and give her Toprol-XL 25 mg once a day.  She  has severe dementia.  I do not think that we will be successful in keeping an event monitor on her.  She becomes quite agitated and tends to pick quite a bit. I do not think any further work-up is necessary at this time.  2.  Hypertension: Blood pressure is fairly well controlled.  [facility] will sign off.   Medication Recommendations:  Change metoprolol to Toprol XL 25 mg daily  Other recommendations (labs, testing, etc):   Follow up as an outpatient:  With Dr. [name]   For questions or updates, please contact [place] Please consult [email or web address] for contact info under        Signed, Mertie Moores, MD  05/17/2019, 9:49 AM

## 2019-05-17 NOTE — Consult Note (Addendum)
Neurology Consultation  Reason for Consult: Syncope versus seizure Referring Physician: Dr. Wyline Copas  CC: Spell of prolonged unresponsiveness   History is obtained from: Daughter  HPI: [name] is a 86 y.o. female with history of pulmonary hypertension, essential HTN, hyperlipidemia, CAD and chronic atrial fibrillation (not on anticoagulation secondary to intracranial hemorrhage 12/18/2017).  She presented with what was initially thought to be an extended syncopal spell lasting 30 minutes. Cardiology does not feel that the etiology is cardiogenic. Neurology was consulted for possible seizure versus syncope.  The patient's daughter provides the history, as patient is non-cooperative. She states that since a prior stroke, the patient walks with a walker or cane.  On the day of admission they were about to take a bath.  As patient sat down in the chair of the bathtub, she looked over at her daughter and stated "I think I am going to faint" and also stated that she felt hot.  It was at that moment that the patient developed a blank stare, became verbally responsive and then slowly slumped to the right but did not fall out of her chair. She then appeared to be sleeping and was not arousable to sternal rub. 911 was called and EMS was dispatched.  Daughter states it took approximately 10 minutes for them to get there.  During that time, the patient had no clinical seizure activity.  When EMS arrived the patient was placed on gurney and still had not had any resolution of symptoms.  By the time the patient was brought to the hospital and daughter was able to see her, the patient was confused - has history of dementia at baseline - but was talking.  While in the hospital daughter states that patient has become very agitated and confused, more than is normal for her.  The patient has had one other episode of sudden onset unresponsiveness with similar symptoms as the current episode, but which was of much  shorter duration.   The patient has a prior history of hemorrhagic stroke. At that time she was started on Keppra. However, the daughter does not recall her mother ever having a seizure.   Daughter on further interview denies any abnormal movements during the patient's sleep. However, she has had formed visual hallucinations recently, waxing and waning mentation and also has had delusions that a copy of her daughter also makes appearances in her home - this "copy" is described as the "bad" version of her daughter.   Past Medical History:  Diagnosis Date  . Ankle fracture   . Anxiety   . Arthritis   . Barrett's esophagus   . Chronic atrial fibrillation ([place])    a. Pt previously declined DCCV.  Marland Kitchen Coronary artery disease    a. s/p Cypher DES to LAD in 2006;  b. Last LHC 02/2008:  pLAD 30%, mLAD stent patent, pOM 50%, EF 65%;  c. Lexiscan Myoview 7/14:  Intermediate risk, dist ant and inf-lat ischemia, EF 66% - patient declined cath and elected medical therapy.  Marland Kitchen GERD (gastroesophageal reflux disease)   . Hiatal hernia   . Hx of echocardiogram    Echocardiogram 08/17/12: EF 55-60%, MAC, mild MR, mild LAE, mild RVE, mild to moderate RAE, moderate TR, mildly increased pulmonary artery systolic pressures  . Hyperlipidemia   . Hypertension   . Mitral regurgitation    a. Mod by echo 10/2014.  . Obesity   . Pulmonary hypertension ([place])    a. Mod-severely elevated PA pressures by echo  10/2014.  Marland Kitchen QT prolongation    a. During 10/2014 admit, QTC 576m.  . Tricuspid regurgitation    a. Mod by echo 10/2014.  .Marland KitchenUTI (lower urinary tract infection) 03/2015    Family History  Problem Relation Age of Onset  . Diabetes Mother   . Heart attack Mother   . Lung cancer Father   . Thyroid disease Sister   . Thyroid disease Sister   . Hypertension Sister   . Hypertension Sister   . Hypertension Sister   . Hypertension Sister   . Hypertension Brother   . Hypertension Brother   . Breast cancer Other 45   . Colon cancer Neg Hx   . Colon polyps Neg Hx   . Esophageal cancer Neg Hx   . Stroke Neg Hx    Social History:   reports that she has quit smoking. Her smoking use included cigarettes. She has never used smokeless tobacco. She reports that she does not drink alcohol or use drugs.  Medications  Current Facility-Administered Medications:  .  0.9 %  sodium chloride infusion, , Intravenous, Continuous, Garba, Mohammad L, MD, Last Rate: 75 mL/hr at 05/17/19 1145, New Bag at 05/17/19 1145 .  irbesartan (AVAPRO) tablet 150 mg, 150 mg, Oral, Daily, GJonelle Sidle Mohammad L, MD, 150 mg at 05/16/19 1003 .  levETIRAcetam (KEPPRA) tablet 500 mg, 500 mg, Oral, Daily, GJonelle Sidle Mohammad L, MD, 500 mg at 05/17/19 1127 .  melatonin tablet 6 mg, 6 mg, Oral, QHS PRN, GElwyn Reach MD, 6 mg at 05/16/19 2129 .  metoprolol succinate (TOPROL-XL) 24 hr tablet 25 mg, 25 mg, Oral, Daily, Nahser, PWonda Cheng MD, 25 mg at 05/17/19 1127 .  OLANZapine zydis (ZYPREXA) disintegrating tablet 2.5 mg, 2.5 mg, Oral, BID PRN, Vann, Jessica U, DO .  pantoprazole (PROTONIX) EC tablet 40 mg, 40 mg, Oral, QAC breakfast, GJonelle Sidle Mohammad L, MD, 40 mg at 05/16/19 1003 .  sertraline (ZOLOFT) tablet 50 mg, 50 mg, Oral, Daily, Garba, Mohammad L, MD, 50 mg at 05/17/19 1128  ROS: Unable to obtain due to altered mental status.   Exam: Current vital signs: BP (!) 143/68 (BP Location: Left Arm)   Pulse 74   Temp 97.6 F (36.4 C) (Oral)   Resp 14   Wt 63 kg   SpO2 99%   BMI 21.12 kg/m  Vital signs in last 24 hours: Temp:  [97.4 F (36.3 C)-97.7 F (36.5 C)] 97.6 F (36.4 C) (03/30 0607) Pulse Rate:  [68-85] 74 (03/30 0607) Resp:  [14-19] 14 (03/30 0607) BP: (126-168)/(68-91) 143/68 (03/30 0607) SpO2:  [98 %-99 %] 99 % (03/30 09935  Constitutional: Cachectic Psych:  agitated Eyes: No scleral injection HENT: No OP obstrucion Head: Normocephalic.  Cardiovascular: Irregular irregular Respiratory: Effort normal, non-labored  breathing GI: Soft.  No distension. There is no tenderness.  Skin: WDI, significantly fragile and thin, translucent  Neuro: Mental Status: Patient is at this time curled in a ball and does not like to be touched.  She becomes very aggravated if touched. Speech-briefly replies to examiner that she does not want to move her limbs or take her blanket off for neurological exam. Her speech was fluent and nondysarthric.  Cranial Nerves: II: Eyes clenched tightly shut III,IV, VI: Unable to visualize secondary to above  V: Facial sensation is symmetric to temperature VII: Unable to test secondary to patient not taking part in exam  Motor: Briefly moved BLE a few inches when requested. Resists examiner with equal strength in  BUE when attempt is made by examiner to move them.  Sensory: Withdraws from noxious stimuli in all extremities Deep Tendon Reflexes: 2+ upper extremities  Plantars: Toes are downgoing bilaterally. Cerebellar/Gait: Unable to assess  Labs I have reviewed labs in epic and the results pertinent to this consultation are:   CBC    Component Value Date/Time   WBC 8.3 05/16/2019 0558   RBC 4.01 05/16/2019 0558   HGB 12.4 05/16/2019 0558   HCT 37.1 05/16/2019 0558   HCT 29.2 (L) 12/26/2017 0610   PLT 178 05/16/2019 0558   MCV 92.5 05/16/2019 0558   MCH 30.9 05/16/2019 0558   MCHC 33.4 05/16/2019 0558   RDW 13.1 05/16/2019 0558   LYMPHSABS 2.1 05/15/2019 1615   MONOABS 0.9 05/15/2019 1615   EOSABS 0.3 05/15/2019 1615   BASOSABS 0.1 05/15/2019 1615    CMP     Component Value Date/Time   NA 139 05/16/2019 0558   NA 138 09/08/2016 1233   K 3.5 05/16/2019 0558   CL 102 05/16/2019 0558   CO2 27 05/16/2019 0558   GLUCOSE 95 05/16/2019 0558   BUN 11 05/16/2019 0558   BUN 13 09/08/2016 1233   CREATININE 0.73 05/16/2019 0558   CALCIUM 9.2 05/16/2019 0558   PROT 6.3 (L) 05/16/2019 0558   ALBUMIN 3.5 05/16/2019 0558   AST 21 05/16/2019 0558   ALT 13 05/16/2019 0558    ALKPHOS 48 05/16/2019 0558   BILITOT 1.1 05/16/2019 0558   GFRNONAA >60 05/16/2019 0558   GFRAA >60 05/16/2019 0558    Lipid Panel     Component Value Date/Time   CHOL 170 02/19/2018 0837   TRIG 91 02/19/2018 0837   HDL 48 02/19/2018 0837   CHOLHDL 3.5 02/19/2018 0837   VLDL 18 02/19/2018 0837   LDLCALC 104 (H) 02/19/2018 0837     Imaging I have reviewed the images obtained:  CT-scan of the brain-no acute intracranial process.   Etta Quill PA-C [facility] [phone] 05/17/2019, 1:52 PM     Assessment: 86 year old female with episode of prolonged unresponsiveness. Cardiology does not feel that it is cardiogenic.  Initial differential diagnosis prior to the consult included dysautonomia versus seizure versus worsening of dementia with staring spells.  1. Has symptoms of reduplicative paramnesia (Capgras syndrome), visual hallucinations and waxing/waning mental status. No abnormal movements during sleep and gait is not shuffling, per daughter. Overall symptoms in the context of cognitive decline are suggestive of Lewy body dementia.  2. The spell described by the daughter is most likely a cognitive fluctuation secondary to LBD. No jerking or twitching to suggest a seizure. Syncope unlikely given the duration of her LOC.   Recommendations: -EEG -MRI brain without contrast -Will likely need placement after hospital visit --Avoid antipsychotic medications, which can worsen symptoms of LBD. Consider discontinuation of olanzapine. If an antipsychotic with sedating properties is needed, Seroquel would be a better alternative.  --Consider a trial of Aricept, 5 mg po qhs --Will need outpatient Neurology follow up for dementia evaluation  I have seen and examined the patient. I have formulated the assessment and recommendations. 86 year old female with episode of prolonged unresponsiveness. Overall clinical picture most consistent with LBD. Exam not informative due to  patient being uncooperative. Recommendations as above.  Electronically signed: Dr. Kerney Elbe

## 2019-05-17 NOTE — Progress Notes (Signed)
Patient's heart rate has being dropping in the 30s at times. Patient has history of Afib. MD was notify. Will continue to monitor patient

## 2019-05-17 NOTE — Progress Notes (Signed)
PROGRESS NOTE    [name]  [id number] DOB: 1927/02/07 DOA: 05/15/2019 PCP: Merrilee Seashore, MD    Brief Narrative:  86 y.o. female with medical history significant ofcoronary artery disease, atrial fibrillation, GERD, previous syncope, pulmonary hypertension, previous intracranial hemorrhage, recurrent UTIs who is a DNR and lives at home. Patient came in to the ER after prolonged syncopal episode today at home. Daughter reported patient being out almost 30 minutes. She was reportedly sitting in a chair getting ready to shower when she told her daughter she was about to pass out. She felt very hot and then slumped to the right side. Patient's eyes were open the entire time. They were waiting for her for more than 10 minutes but was still out. She finally came around 30 minutes later and was fully conscious. Patient appears to be back to her baseline since then.  Assessment & Plan:   Principal Problem:   Syncope and collapse Active Problems:   Mixed hyperlipidemia   GERD   Atrial fibrillation (HCC)   Coronary artery disease involving native coronary artery of native heart without angina pectoris   Benign essential HTN  Syncope/collapse -Suspicion for tachy-brady with HR noted in the 30's up to the 140's -Daughter reports patient has several episodes of this including times when she is unable to speak or get her words out correctly -Cardiology was consulted, appreciate input. Recommendation for decreasing Toprol-XL to 25mg  from 50mg  and signed off -Have also consulted Neurology, appreciate inpt. Recs for MRI, EEG -On the afternoon of 3/30, pt noted to become bradycardic with HR in the 30's. EKG ordered, pending -Discussed case with Cardiology who recommends continuing to hold further metoprolol, would follow up tomorrow  Paroxysmal A. fib with episodes of RVR and episodes of bradycardia -Stopped metoprolol per above given bradycardia, per Cardiology  recs -Patient is not on blood thinner due to previous intracranial bleed -Per chart review patient cannot have blood thinners going forward  Hypertension -Resume home meds as tolerated  Dementia -Review shows that family states patient is confused at baseline   DVT prophylaxis: SCD's Code Status: DNR Family Communication: Pt in room, family at bedside Disposition Plan: From home, possible SNF when HR stabilizes and when cleared by Neurology and Cardiology  Consultants:   Neurology  Cardiology  Procedures:     Antimicrobials: Anti-infectives (From admission, onward)   None       Subjective: Confused, unable to assess  Objective: Vitals:   05/16/19 1651 05/16/19 2115 05/16/19 2115 05/17/19 0607  BP: (!) 168/71 (!) 126/91 (!) 126/91 (!) 143/68  Pulse: 68 85 85 74  Resp: 18 19 19 14   Temp: 97.7 F (36.5 C) (!) 97.4 F (36.3 C) (!) 97.4 F (36.3 C) 97.6 F (36.4 C)  TempSrc: Oral Oral Oral Oral  SpO2: 98% 98% 98% 99%  Weight:        Intake/Output Summary (Last 24 hours) at 05/17/2019 1539 Last data filed at 05/17/2019 1020 Gross per 24 hour  Intake 712.27 ml  Output 0 ml  Net 712.27 ml   Filed Weights   05/15/19 2030  Weight: 63 kg    Examination:  General exam: Appears calm and comfortable  Respiratory system: Clear to auscultation. Respiratory effort normal. Cardiovascular system: S1 & S2 heard, Regular Gastrointestinal system: Abdomen is nondistended, decreased BS Central nervous system:lethargic. No focal neurological deficits. Extremities: Symmetric 5 x 5 power. Skin: No rashes, lesions Psychiatry: Unable to assess given current mentation  Data Reviewed: I have  personally reviewed following labs and imaging studies  CBC: Recent Labs  Lab 05/15/19 1615 05/16/19 0558  WBC 7.4 8.3  NEUTROABS 4.0  --   HGB 12.3 12.4  HCT 37.4 37.1  MCV 93.7 92.5  PLT 150 0000000   Basic Metabolic Panel: Recent Labs  Lab 05/15/19 1615 05/16/19 0558   NA 139 139  K 4.1 3.5  CL 103 102  CO2 24 27  GLUCOSE 129* 95  BUN 18 11  CREATININE 0.86 0.73  CALCIUM 9.0 9.2   GFR: CrCl cannot be calculated (Unknown ideal weight.). Liver Function Tests: Recent Labs  Lab 05/15/19 1615 05/16/19 0558  AST 25 21  ALT 14 13  ALKPHOS 43 48  BILITOT 0.9 1.1  PROT 6.1* 6.3*  ALBUMIN 3.6 3.5   No results for input(s): LIPASE, AMYLASE in the last 168 hours. No results for input(s): AMMONIA in the last 168 hours. Coagulation Profile: No results for input(s): INR, PROTIME in the last 168 hours. Cardiac Enzymes: No results for input(s): CKTOTAL, CKMB, CKMBINDEX, TROPONINI in the last 168 hours. BNP (last 3 results) No results for input(s): PROBNP in the last 8760 hours. HbA1C: No results for input(s): HGBA1C in the last 72 hours. CBG: Recent Labs  Lab 05/15/19 1341  GLUCAP 135*   Lipid Profile: No results for input(s): CHOL, HDL, LDLCALC, TRIG, CHOLHDL, LDLDIRECT in the last 72 hours. Thyroid Function Tests: No results for input(s): TSH, T4TOTAL, FREET4, T3FREE, THYROIDAB in the last 72 hours. Anemia Panel: No results for input(s): VITAMINB12, FOLATE, FERRITIN, TIBC, IRON, RETICCTPCT in the last 72 hours. Sepsis Labs: No results for input(s): PROCALCITON, LATICACIDVEN in the last 168 hours.  Recent Results (from the past 240 hour(s))  SARS CORONAVIRUS 2 (TAT 6-24 HRS) Nasopharyngeal Nasopharyngeal Swab     Status: None   Collection Time: 05/15/19  7:48 PM   Specimen: Nasopharyngeal Swab  Result Value Ref Range Status   SARS Coronavirus 2 NEGATIVE NEGATIVE Final    Comment: (NOTE) SARS-CoV-2 target nucleic acids are NOT DETECTED. The SARS-CoV-2 RNA is generally detectable in upper and lower respiratory specimens during the acute phase of infection. Negative results do not preclude SARS-CoV-2 infection, do not rule out co-infections with other pathogens, and should not be used as the sole basis for treatment or other patient  management decisions. Negative results must be combined with clinical observations, patient history, and epidemiological information. The expected result is Negative. Fact Sheet for Patients: SugarRoll.be Fact Sheet for Healthcare Providers: https://www.woods-mathews.com/ This test is not yet approved or cleared by the [place] FDA and  has been authorized for detection and/or diagnosis of SARS-CoV-2 by FDA under an Emergency Use Authorization (EUA). This EUA will remain  in effect (meaning this test can be used) for the duration of the COVID-19 declaration under Section 56 4(b)(1) of the Act, 21 U.S.C. section 360bbb-3(b)(1), unless the authorization is terminated or revoked sooner. Performed at [facility], [place] [address], [place], [place] [address]      Radiology Studies: No results found.  Scheduled Meds: . irbesartan  150 mg Oral Daily  . levETIRAcetam  500 mg Oral Daily  . metoprolol succinate  25 mg Oral Daily  . pantoprazole  40 mg Oral QAC breakfast  . sertraline  50 mg Oral Daily   Continuous Infusions: . sodium chloride 75 mL/hr at 05/17/19 1145     LOS: 1 day   Marylu Lund, MD [facility] Pager On Amion  If 7PM-7AM, please contact  night-coverage 05/17/2019, 3:39 PM

## 2019-05-17 NOTE — Progress Notes (Signed)
EEG complete - results pending 

## 2019-05-17 NOTE — Plan of Care (Signed)
  Problem: Pain Managment: Goal: General experience of comfort will improve Outcome: Progressing   

## 2019-05-18 ENCOUNTER — Inpatient Hospital Stay (HOSPITAL_COMMUNITY): Payer: Medicare Other

## 2019-05-18 ENCOUNTER — Encounter (HOSPITAL_COMMUNITY): Payer: Self-pay | Admitting: *Deleted

## 2019-05-18 DIAGNOSIS — G3183 Dementia with Lewy bodies: Secondary | ICD-10-CM

## 2019-05-18 DIAGNOSIS — F0280 Dementia in other diseases classified elsewhere without behavioral disturbance: Secondary | ICD-10-CM

## 2019-05-18 MED ORDER — POLYETHYLENE GLYCOL 3350 17 G PO PACK
17.0000 g | PACK | Freq: Every day | ORAL | Status: DC
  Filled 2019-05-18: qty 1

## 2019-05-18 MED ORDER — DOCUSATE SODIUM 100 MG PO CAPS
100.0000 mg | ORAL_CAPSULE | Freq: Two times a day (BID) | ORAL | Status: DC
  Filled 2019-05-18: qty 1

## 2019-05-18 MED ORDER — ADULT MULTIVITAMIN W/MINERALS CH
1.0000 | ORAL_TABLET | Freq: Every day | ORAL | Status: DC
  Filled 2019-05-18: qty 1

## 2019-05-18 MED ORDER — DONEPEZIL HCL 5 MG PO TABS: 5.0000 mg | ORAL_TABLET | Freq: Every day | ORAL | 0 refills | Status: DC

## 2019-05-18 MED ORDER — ORAL CARE MOUTH RINSE: 15.0000 mL | Freq: Two times a day (BID) | OROMUCOSAL | Status: DC

## 2019-05-18 MED ORDER — SIMETHICONE 80 MG PO CHEW: 80.0000 mg | CHEWABLE_TABLET | Freq: Four times a day (QID) | ORAL | Status: DC | PRN

## 2019-05-18 MED ORDER — TAMSULOSIN HCL 0.4 MG PO CAPS: 0.4000 mg | ORAL_CAPSULE | Freq: Every day | ORAL | Status: DC

## 2019-05-18 MED ORDER — MAGNESIUM OXIDE 400 (241.3 MG) MG PO TABS
200.0000 mg | ORAL_TABLET | Freq: Two times a day (BID) | ORAL | Status: DC
  Filled 2019-05-18: qty 1

## 2019-05-18 NOTE — Discharge Summary (Signed)
Physician Discharge Summary  [name] [id number] DOB: December 21, 1926 DOA: 05/15/2019  PCP: Merrilee Seashore, MD  Admit date: 05/15/2019 Discharge date: 05/18/2019  Admitted From: Home Disposition: Home  Recommendations for Outpatient Follow-up:  1. Follow up with PCP in 1-2 weeks 2. Please obtain BMP/CBC in one week 3. Please follow up on the following pending results:  Home Health: None Equipment/Devices: None  Discharge Condition: Able CODE STATUS: DNR Diet recommendation: Cardiac  Subjective: Seen and examined.  No complaints.  Brief/Interim Summary: 86 y.o.femalewith medical history significant ofcoronary artery disease, atrial fibrillation, GERD, previous syncope, pulmonary hypertension, previous intracranial hemorrhage, recurrent UTIs who is a DNR and lives at home. Patient came in to the ER after prolonged syncopal episode at home. Daughter reported patient being out almost 30 minutes. She was reportedly sitting in a chair getting ready to shower when she told her daughter she was about to pass out. She felt very hot and then slumped to the right side. Patient's eyes were open the entire time. They were waiting for her for more than 10 minutes but was still out. She finally came around 30 minutes later and was fully conscious.  There was no evidence of tongue biting, urine or bladder incontinence.  Patient was admitted to hospital service for further work-up of syncope.  Neurology was consulted.  CT head followed by MRI brain was done which did not show any acute pathology or any stroke.  Patient did not have any focal neurological deficit.  She was back to her baseline when she was seen by hospitalist and neurologist.  EEG was done which did not indicate any seizure activity.  Patient's Keppra was continued from home.  In short, patient was thought to be having Lewy body dementia per neurology work-up and clinical impression and recommendation was to start her on  Aricept 5 mg p.o. nightly and avoid antipsychotic medications.  Will need outpatient neurology follow-up for evaluation of possible Lewy body dementia.  Neurology signed off and cleared the patient for discharge.  I personally discussed all of this with the daughter [name] over the phone.  She is agreeable with the plan.  Following is the exact copy paste of assessment, impression and plan per neurology.:  Assessment:86 year old female presenting to the hospital after anepisode ofprolongedunresponsiveness. Overall presentation is most consistent with a cognitive fluctuation in the setting of undiagnosed Lewy body dementia.  1. Has symptoms of reduplicative paramnesia (Capgras syndrome), visual hallucinations and waxing/waning mental status. No abnormal movements during sleep and gait is not shuffling, per daughter. Overall symptoms in the context of cognitive decline are suggestive of Lewy body dementia.  2. The spell in the shower at home as described by the daughter is most likely a cognitive fluctuation secondary to LBD. No jerking or twitching to suggest a seizure. Syncope unlikely given the duration of her LOC. 3. Today, the patient appears to be cognitively back to baseline. This is further evidence for cognitive fluctuations exhibiting a pattern that is consistent with that seen in LBD.  4. EEG report (3/31): Findings: Intermittent slow, generalized. Background slow. IMPRESSION: This study issuggestive of mild diffuse encephalopathy, non specific to etiology.No seizures or epileptiform discharges were seen throughout the recording. 5. MRI shows no acute intracranial abnormality. There is encephalomalacia and gliosis in the anterior right temporal lobe with associated right hippocampus volume loss and superficial siderosis. 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 Extensive cerebral white matter disease, likely related to advanced chronic small vessel ischemic changes, significantly progressed when compared to prior MRI performed on December 18, 2017. Diffuse cerebral volume loss, particularly in the bilateral parietal regions.    Recommendations: --Avoid antipsychotic medications, which can worsen symptoms of LBD. Consider discontinuation of olanzapine. If an antipsychotic with sedating properties is needed, Seroquel would be a better alternative.  --Consider a trial of Aricept, 5 mg po qhs --Will need outpatient Neurology follow up for evaluation of possible Lewy body dementia --Neurology will sign off. Please call if there are additional questions  Discharge Diagnoses:  Principal Problem:   Syncope and collapse Active Problems:   Mixed hyperlipidemia   GERD   Atrial fibrillation (HCC)   Coronary artery disease involving native coronary artery of native heart without angina pectoris   Benign essential HTN   Lewy body dementia [facility])    Discharge Instructions  Discharge Instructions    Discharge patient   Complete by: As directed    Discharge disposition: 01-Home or Self Care   Discharge patient date: 05/18/2019     Allergies as of 05/18/2019      Reactions   Amlodipine Shortness Of Breath, Swelling, Other (See Comments)   Edema in legs   Methyldopa Shortness Of Breath, Swelling   Shellfish-derived Products Other (See Comments)   Syncopal episode   Tape Itching, Other (See Comments)   Also TEARS THE SKIN, SO PLEASE USE AN ALTERNATIVE!!   Actonel [risedronate Sodium] Other (See Comments)   Caused a lump to come up in throat and caused heartburn   Ciprofloxacin Hcl Other (See Comments)   Confusion   Gas-x [simethicone] Itching   Hydralazine Hcl Other (See Comments)   Unknown reaction, per patient   Lactose Intolerance (gi) Diarrhea   Lisinopril Cough   Milk-related  Compounds Diarrhea   Oxycodone Hcl Other (See Comments)   Hallucinations   Pneumococcal Vaccines Swelling, Other (See Comments)   Arm swells   Valsartan Other (See Comments)   Unknown reaction, per pt   Cozaar [losartan] Rash   Digitek [digoxin] Rash   Felodipine Rash   Hctz [hydrochlorothiazide] Itching, Rash   Pt reports causing "itching under the skin"   Simvastatin Rash   Spironolactone Itching, Rash      Medication List    STOP taking these medications   cephALEXin 500 MG capsule Commonly known as: KEFLEX     TAKE these medications   acetaminophen 500 MG tablet Commonly known as: TYLENOL Take 500 mg by mouth at bedtime. What changed: Another medication with the same name was removed. Continue taking this medication, and follow the directions you see here.   docusate sodium 100 MG capsule Commonly known as: COLACE Take 1 capsule (100 mg total) by mouth 2 (two) times daily.   donepezil 5 MG tablet Commonly known as: Aricept Take 1 tablet (5 mg total) by mouth at bedtime.   irbesartan 150 MG tablet Commonly known as: AVAPRO Take 1 tablet (150 mg total) by mouth daily. What changed: when to take this   levETIRAcetam 500 MG tablet Commonly known as: Keppra Take 1 tablet (500 mg total) by mouth 2 (two) times daily. What changed: when to take this   magnesium oxide 400 (241.3 Mg) MG tablet Commonly known as: MAG-OX Take 0.5 tablets (200 mg total) by mouth 2 (two) times daily.   melatonin 5 MG Tabs Take 5 mg by mouth at  bedtime as needed (for sleep/anxiety).   metoprolol tartrate 50 MG tablet Commonly known as: LOPRESSOR Take 1 tablet (50 mg total) by mouth 2 (two) times daily. What changed: when to take this   mouth rinse Liqd solution 15 mLs by Mouth Rinse route 2 (two) times daily.   multivitamin with minerals Tabs tablet Take 1 tablet by mouth daily.   Muscle Rub 10-15 % Crea Apply 1 application topically every 6 (six) hours. To bilateral knees    nitroGLYCERIN 0.4 MG SL tablet Commonly known as: NITROSTAT Place 1 tablet (0.4 mg total) under the tongue every 5 (five) minutes as needed for chest pain (3 doses only). What changed:   when to take this  reasons to take this   omeprazole 20 MG tablet Commonly known as: PRILOSEC OTC Take 20 mg by mouth daily before breakfast.   polyethylene glycol 17 g packet Commonly known as: MIRALAX / GLYCOLAX Take 17 g by mouth daily.   potassium chloride SA 20 MEQ tablet Commonly known as: KLOR-CON Take 20 mEq by mouth daily.   ranitidine 150 MG/10ML syrup Commonly known as: ZANTAC Take 10 mLs (150 mg total) by mouth 2 (two) times daily.   sertraline 50 MG tablet Commonly known as: ZOLOFT Take 50 mg by mouth daily.   simethicone 80 MG chewable tablet Commonly known as: MYLICON Chew 1 tablet (80 mg total) by mouth 4 (four) times daily as needed for flatulence.   tamsulosin 0.4 MG Caps capsule Commonly known as: FLOMAX Take 1 capsule (0.4 mg total) by mouth daily after supper.      Follow-up Information    Merrilee Seashore, MD Follow up in 1 week(s).   Specialty: Internal Medicine Contact information: [place] [address] [phone]          Allergies  Allergen Reactions  . Amlodipine Shortness Of Breath, Swelling and Other (See Comments)    Edema in legs  . Methyldopa Shortness Of Breath and Swelling  . Shellfish-Derived Products Other (See Comments)    Syncopal episode  . Tape Itching and Other (See Comments)    Also TEARS THE SKIN, SO PLEASE USE AN ALTERNATIVE!!  Marland Kitchen Actonel [Risedronate Sodium] Other (See Comments)    Caused a lump to come up in throat and caused heartburn  . Ciprofloxacin Hcl Other (See Comments)    Confusion   . Gas-X [Simethicone] Itching  . Hydralazine Hcl Other (See Comments)    Unknown reaction, per patient   . Lactose Intolerance (Gi) Diarrhea  . Lisinopril Cough  . Milk-Related Compounds  Diarrhea  . Oxycodone Hcl Other (See Comments)    Hallucinations   . Pneumococcal Vaccines Swelling and Other (See Comments)    Arm swells  . Valsartan Other (See Comments)    Unknown reaction, per pt  . Cozaar [Losartan] Rash  . Digitek [Digoxin] Rash  . Felodipine Rash  . Hctz [Hydrochlorothiazide] Itching and Rash    Pt reports causing "itching under the skin"  . Simvastatin Rash  . Spironolactone Itching and Rash    Consultations: Neurology   Procedures/Studies: CT Head Wo Contrast  Result Date: 05/15/2019 CLINICAL DATA:  Syncope. Confusion. EXAM: CT HEAD WITHOUT CONTRAST TECHNIQUE: Contiguous axial images were obtained from the base of the skull through the vertex without intravenous contrast. COMPARISON:  Brain CT 03/12/2018 FINDINGS: Brain: Ventricles and sulci are prominent compatible with atrophy. Periventricular and subcortical white matter hypodensities compatible with chronic microvascular ischemic changes. No evidence for acute cortically  based infarct, intracranial hemorrhage, mass lesion or mass-effect. Vascular: Unremarkable Skull: Intact. Sinuses/Orbits: Paranasal sinuses are well aerated. Mastoid air cells are unremarkable. Other: None. IMPRESSION: No acute intracranial process. Atrophy and chronic microvascular ischemic changes. Electronically Signed   By: Lovey Newcomer M.D.   On: 05/15/2019 15:22   MR BRAIN WO CONTRAST  Result Date: 05/18/2019 CLINICAL DATA:  Seizure. Abnormal neuro exam. EXAM: MRI HEAD WITHOUT CONTRAST TECHNIQUE: Multiplanar, multiecho pulse sequences of the brain and surrounding structures were obtained without intravenous contrast. COMPARISON:  Head CT May 15 2019; MRI of the brain April 10, 2015 FINDINGS: Brain: No acute infarction, hemorrhage, hydrocephalus, extra-axial collection or mass lesion. Susceptibility artifact is noted along the ventricular system, predominantly in the temporal and occipital horns of the right lateral ventricle with  associated superficial siderosis along the cerebral and most prominently along the the cerebellar and brainstem surface. These findings are secondary to prior intraventricular hemorrhage documented on CT of the head performed on December 18, 2017. There is associated encephalomalacia and gliosis in the anterior right temporal lobe with decreased volume and superficial siderosis of the right hippocampus. He punctate susceptibility artifacts are also seen in the bilateral basal ganglia, left occipital lobe and pons, likely corresponding to small hemosiderin deposit. Extensive scattered and confluent T2 hyperintensities within the white matter of the cerebral hemispheres, nonspecific, significantly progressed when compared to prior MRI performed 2017. There is diffuse prominence of the cerebral sulci, particularly in the bilateral parietal regions with prominence of the cerebellar sulci and ventricular system, reflecting parenchymal volume loss. Vascular: Normal flow voids. Skull and upper cervical spine: Normal marrow signal. Sinuses/Orbits: Negative. Other: None. IMPRESSION: 1. No acute intracranial abnormality. 2. Encephalomalacia and gliosis in the anterior right temporal lobe with associated right hippocampus volume loss and superficial siderosis. 3. 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 4. Extensive cerebral white matter disease, likely related to advance chronic small vessel ischemic changes, significantly progressed when compared to prior MRI performed on December 18, 2017. 5. Diffuse cerebral volume loss, particularly in the bilateral parietal regions. Electronically Signed   By: Pedro Earls M.D.   On: 05/18/2019 10:18   EEG adult  Result Date: 05/18/2019 Lora Havens, MD     05/18/2019  8:58 AM Patient Name:  DIELLA HERMANNS MRN: [id number] Epilepsy Attending: Lora Havens Referring Physician/Provider: Dr Marylu Lund Date: 05/17/2019 Duration: 23.53 mins Patient history: 86 year old female with episode of prolonged unresponsiveness. EEG to evaluate for seizure. Level of alertness: awake AEDs during EEG study: LEV Technical aspects: This EEG study was done with scalp electrodes positioned according to the 10-20 International system of electrode placement. Electrical activity was acquired at a sampling rate of 500Hz  and reviewed with a high frequency filter of 70Hz  and a low frequency filter of 1Hz . EEG data were recorded continuously and digitally stored. DESCRIPTION: The posterior dominant rhythm consists of 7Hz  activity of moderate voltage (25-35 uV) seen predominantly in posterior head regions, symmetric and reactive to eye opening and eye closing. EEG showed intermittent generalized 3-5hz  theta-delta slowing.  Hyperventilation and photic stimulation were not performed. ABNORMALITY - Intermittent slow, generalized - Background slow IMPRESSION: This study is suggestive of mild diffuse encephalopathy, non specific to etiology. No seizures or epileptiform discharges were seen throughout the recording. Lora Havens      Discharge Exam: Vitals:  05/18/19 0231 05/18/19 0441  BP:  (!) 150/68  Pulse: (!) 57 (!) 55  Resp:  16  Temp:  98.5 F (36.9 C)  SpO2:  96%   Vitals:   05/17/19 0800 05/17/19 2046 05/18/19 0231 05/18/19 0441  BP: (!) 149/69 (!) 141/69  (!) 150/68  Pulse: 79 68 (!) 57 (!) 55  Resp: 16 17  16   Temp:  98.4 F (36.9 C)  98.5 F (36.9 C)  TempSrc:      SpO2: 98% 95%  96%  Weight:  63.2 kg      General: Pt is alert, awake, not in acute distress Cardiovascular: RRR, S1/S2 +, no rubs, no gallops Respiratory: CTA bilaterally, no wheezing, no rhonchi Abdominal: Soft, NT, ND, bowel sounds + Extremities: no edema, no cyanosis    The results of significant diagnostics from  this hospitalization (including imaging, microbiology, ancillary and laboratory) are listed below for reference.     Microbiology: Recent Results (from the past 240 hour(s))  SARS CORONAVIRUS 2 (TAT 6-24 HRS) Nasopharyngeal Nasopharyngeal Swab     Status: None   Collection Time: 05/15/19  7:48 PM   Specimen: Nasopharyngeal Swab  Result Value Ref Range Status   SARS Coronavirus 2 NEGATIVE NEGATIVE Final    Comment: (NOTE) SARS-CoV-2 target nucleic acids are NOT DETECTED. The SARS-CoV-2 RNA is generally detectable in upper and lower respiratory specimens during the acute phase of infection. Negative results do not preclude SARS-CoV-2 infection, do not rule out co-infections with other pathogens, and should not be used as the sole basis for treatment or other patient management decisions. Negative results must be combined with clinical observations, patient history, and epidemiological information. The expected result is Negative. Fact Sheet for Patients: SugarRoll.be Fact Sheet for Healthcare Providers: https://www.woods-mathews.com/ This test is not yet approved or cleared by the [place] FDA and  has been authorized for detection and/or diagnosis of SARS-CoV-2 by FDA under an Emergency Use Authorization (EUA). This EUA will remain  in effect (meaning this test can be used) for the duration of the COVID-19 declaration under Section 56 4(b)(1) of the Act, 21 U.S.C. section 360bbb-3(b)(1), unless the authorization is terminated or revoked sooner. Performed at [facility], [place] [address], [place], [place] [address]      Labs: BNP (last 3 results) No results for input(s): BNP in the last 8760 hours. Basic Metabolic Panel: Recent Labs  Lab 05/15/19 1615 05/16/19 0558  NA 139 139  K 4.1 3.5  CL 103 102  CO2 24 27  GLUCOSE 129* 95  BUN 18 11  CREATININE 0.86 0.73  CALCIUM 9.0 9.2   Liver Function Tests: Recent Labs   Lab 05/15/19 1615 05/16/19 0558  AST 25 21  ALT 14 13  ALKPHOS 43 48  BILITOT 0.9 1.1  PROT 6.1* 6.3*  ALBUMIN 3.6 3.5   No results for input(s): LIPASE, AMYLASE in the last 168 hours. No results for input(s): AMMONIA in the last 168 hours. CBC: Recent Labs  Lab 05/15/19 1615 05/16/19 0558  WBC 7.4 8.3  NEUTROABS 4.0  --   HGB 12.3 12.4  HCT 37.4 37.1  MCV 93.7 92.5  PLT 150 178   Cardiac Enzymes: No results for input(s): CKTOTAL, CKMB, CKMBINDEX, TROPONINI in the last 168 hours. BNP: Invalid input(s): POCBNP CBG: Recent Labs  Lab 05/15/19 1341  GLUCAP 135*   D-Dimer No results for input(s): DDIMER in the last 72 hours. Hgb A1c No results for input(s): HGBA1C in the  last 72 hours. Lipid Profile No results for input(s): CHOL, HDL, LDLCALC, TRIG, CHOLHDL, LDLDIRECT in the last 72 hours. Thyroid function studies No results for input(s): TSH, T4TOTAL, T3FREE, THYROIDAB in the last 72 hours.  Invalid input(s): FREET3 Anemia work up No results for input(s): VITAMINB12, FOLATE, FERRITIN, TIBC, IRON, RETICCTPCT in the last 72 hours. Urinalysis    Component Value Date/Time   COLORURINE YELLOW 05/15/2019 1355   APPEARANCEUR CLEAR 05/15/2019 1355   LABSPEC 1.015 05/15/2019 1355   PHURINE 7.0 05/15/2019 1355   GLUCOSEU NEGATIVE 05/15/2019 1355   HGBUR NEGATIVE 05/15/2019 1355   BILIRUBINUR NEGATIVE 05/15/2019 1355   KETONESUR NEGATIVE 05/15/2019 1355   PROTEINUR NEGATIVE 05/15/2019 1355   UROBILINOGEN 0.2 08/17/2012 1829   NITRITE NEGATIVE 05/15/2019 1355   LEUKOCYTESUR NEGATIVE 05/15/2019 1355   Sepsis Labs Invalid input(s): PROCALCITONIN,  WBC,  LACTICIDVEN Microbiology Recent Results (from the past 240 hour(s))  SARS CORONAVIRUS 2 (TAT 6-24 HRS) Nasopharyngeal Nasopharyngeal Swab     Status: None   Collection Time: 05/15/19  7:48 PM   Specimen: Nasopharyngeal Swab  Result Value Ref Range Status   SARS Coronavirus 2 NEGATIVE NEGATIVE Final    Comment:  (NOTE) SARS-CoV-2 target nucleic acids are NOT DETECTED. The SARS-CoV-2 RNA is generally detectable in upper and lower respiratory specimens during the acute phase of infection. Negative results do not preclude SARS-CoV-2 infection, do not rule out co-infections with other pathogens, and should not be used as the sole basis for treatment or other patient management decisions. Negative results must be combined with clinical observations, patient history, and epidemiological information. The expected result is Negative. Fact Sheet for Patients: SugarRoll.be Fact Sheet for Healthcare Providers: https://www.woods-mathews.com/ This test is not yet approved or cleared by the [place] FDA and  has been authorized for detection and/or diagnosis of SARS-CoV-2 by FDA under an Emergency Use Authorization (EUA). This EUA will remain  in effect (meaning this test can be used) for the duration of the COVID-19 declaration under Section 56 4(b)(1) of the Act, 21 U.S.C. section 360bbb-3(b)(1), unless the authorization is terminated or revoked sooner. Performed at [facility], [place] [address], [place], [place] [address]      Time coordinating discharge: Over 30 minutes  SIGNED:   Darliss Cheney, MD  [facility] 05/18/2019, 1:44 PM  If 7PM-7AM, please contact night-coverage [email or web address]

## 2019-05-18 NOTE — Plan of Care (Signed)
°  Problem: Coping: °Goal: Level of anxiety will decrease °Outcome: Progressing °  °

## 2019-05-18 NOTE — Progress Notes (Signed)
DISCHARGE NOTE HOME [name] to be discharged Home per MD order. Discussed prescriptions and follow up appointments with the patient. Prescriptions given to patient; medication list explained in detail. Patient verbalized understanding.  Skin clean, dry and intact without evidence of skin break down, no evidence of skin tears noted. IV catheter discontinued intact. Site without signs and symptoms of complications. Dressing and pressure applied. Pt denies pain at the site currently. No complaints noted.  Patient free of lines, drains, and wounds.   An After Visit Summary (AVS) was printed and given to the patient. Patient escorted via wheelchair, and discharged home via private auto.  Arlyss Repress, RN

## 2019-05-18 NOTE — Discharge Instructions (Signed)
Lewy Body Dementia Lewy body dementia, also called dementia with Lewy bodies, is a condition that affects the way the brain functions. It is one form of dementia. In this condition, proteins called Lewy bodies build up in certain areas of the brain. This causes problems with:  Memory.  Decision making.  Behavior.  Speaking.  Thinking.  Movements and balance.  Problem solving. This condition is progressive, which means that it gets worse with time (is degenerative) and cannot be reversed. What are the causes? This condition is caused by the buildup of Lewy bodies in brain cells in areas of the brain that control memory, thinking, and movement. It is not known what causes the Lewy bodies to build up. What increases the risk? You are more likely to develop this condition if you:  Have a family history of Lewy body dementia or Parkinson's disease.  Are 65 years old or older.  Are female. What are the signs or symptoms? Symptoms of this condition may include:  Symptoms of dementia, such as: ? Trouble with memory. ? Trouble paying attention. ? Problems with planning and organizing. ? Problems with judgment. ? Behavioral problems.  Symptoms of Parkinson's disease, such as: ? Shaking movements that you cannot control (tremor). Tremors usually start in a hand or foot when you are resting (resting tremor). ? Stooped posture. ? Slowing of movement. ? Stiff muscles (rigidity). ? Loss of balance and stability when standing.  Seeing things that are not there (hallucinating).  Changes in memory, attention, and concentration that come and go (fluctuation).  Sleep problems, such as acting out dreams while you are asleep. How is this diagnosed? This condition is diagnosed by a specialist who diagnoses and treats this condition (neurologist). Your health care provider will talk with you and your family, friends, or caregivers about your history and symptoms. A thorough medical history  will be taken, and you will have a physical exam and tests. Tests may include:  Lab tests, such as blood or urine tests.  Imaging tests, such as a CT scan, a PET scan, or an MRI.  A test that involves removing and testing a small amount of the fluid that surrounds the brain and spinal cord (lumbar puncture).  A test where small metal discs are used to measure electrical activity in the brain (electroencephalogram or EEG).  Tests that evaluate brain function, such as memory tests, cognitive tests, and neuropsychological tests. How is this treated? There is no cure for this condition. Treatment focuses on managing your symptoms. Treatment may include:  Medicines. Everyone responds to medicines differently. Your response may change over time. Work with your health care provider to find the best medicines for you.  Speech, occupational, and physical therapy. Your health care provider can help direct you to support groups, organizations, and other health care providers who can help with decisions about your care. Follow these instructions at home: Medicine  Take over-the-counter and prescription medicines only as told by your health care provider.  To help you manage your medicines, use a pill organizer or pill reminder.  Avoid taking medicines that can affect thinking, such as pain medicines or sleeping medicines. Lifestyle  Make healthy lifestyle choices: ? Be physically active as told by your health care provider. ? Do not use any products that contain nicotine or tobacco, such as cigarettes, e-cigarettes, and chewing tobacco. If you need help quitting, ask your health care provider. ? Try to practice stress-management techniques when you experience stress, such as mindfulness, yoga,   or deep breathing. ? Stay socially connected. Talk regularly with other people, such as family, friends, and neighbors.  Make sure you sleep well. These tips can help you get a good night's rest: ? Avoid  napping during the day. ? Keep your sleeping area dark and cool. ? Avoid exercising a few hours before you go to bed. ? Avoid caffeine products in the evening. Eating and drinking  Do not drink alcohol.  Drink enough fluid to keep your urine pale yellow.  Eat a healthy diet. Safety      Work with your health care provider to determine what you need help with and what your safety needs are.  If you have trouble moving around, use a cane or walker as told by your health care provider.  Make sure your home environment is safe. To do this: ? Remove things that can be a tripping hazard, such as throw rugs or clutter. ? Install grab bars and railings in your home to prevent falls.  Talk with your health care provider about if it is safe for you to drive.  If you were given a bracelet that identifies you as a person with memory loss or tracks your location, make sure to wear it at all times. General instructions  Work with your family to make important decisions, such as advance directives, medical power of attorney, or a living will.  Keep all follow-up visits as told by your health care provider. This is important. Contact a health care provider if you have:  A fever.  Problems with choking or swallowing.  Any symptoms of a new or different illness.  New or worsening trouble with sleeping or increased daytime sleepiness.  New or worsening confusion. Get help right away if:  You feel depressed, sad, or feel that you want to harm yourself.  Your family members become concerned for your safety. If you ever feel like you may hurt yourself or others, or have thoughts about taking your own life, get help right away. You can go to your nearest emergency department or call:  Your local emergency services (911 in the [place]  A suicide crisis helpline, such as the National Suicide Prevention Lifeline at 1-800-273-8255. This is open 24 hours a day. Summary  Dementia is a  condition that affects the way the brain functions. It often affects memory and thinking.  Lewy body dementia is a degenerative dementia.  This condition is caused by the buildup of proteins called Lewy bodies in brain cells. It is not known what causes the Lewy bodies to build up.  Work with your health care provider to determine what you need help with and what your safety needs are.  Your health care provider can help direct you to support groups, organizations, and other health care providers who can help with decisions about your care. This information is not intended to replace advice given to you by your health care provider. Make sure you discuss any questions you have with your health care provider. Document Revised: 04/01/2018 Document Reviewed: 04/01/2018 Elsevier Patient Education  2020 Elsevier Inc.  

## 2019-05-18 NOTE — Procedures (Signed)
Patient Name: LEVENIA GAINOUS  MRN: [id number]  Epilepsy Attending: Lora Havens  Referring Physician/Provider: Dr Marylu Lund Date: 05/17/2019 Duration: 23.53 mins  Patient history: 86 year old female with episode of prolonged unresponsiveness. EEG to evaluate for seizure.  Level of alertness: awake  AEDs during EEG study: LEV  Technical aspects: This EEG study was done with scalp electrodes positioned according to the 10-20 International system of electrode placement. Electrical activity was acquired at a sampling rate of 500Hz  and reviewed with a high frequency filter of 70Hz  and a low frequency filter of 1Hz . EEG data were recorded continuously and digitally stored.   DESCRIPTION: The posterior dominant rhythm consists of 7Hz  activity of moderate voltage (25-35 uV) seen predominantly in posterior head regions, symmetric and reactive to eye opening and eye closing. EEG showed intermittent generalized 3-5hz  theta-delta slowing.  Hyperventilation and photic stimulation were not performed.  ABNORMALITY - Intermittent slow, generalized - Background slow  IMPRESSION: This study is suggestive of mild diffuse encephalopathy, non specific to etiology. No seizures or epileptiform discharges were seen throughout the recording.  Arjay Jaskiewicz Barbra Sarks

## 2019-05-18 NOTE — Progress Notes (Addendum)
Subjective: Significantly improved today. Although her daughter is not in the room to give her opinion, the patient appears to be back to baseline.   Objective: Current vital signs: BP (!) 150/68 (BP Location: Right Arm)   Pulse (!) 55   Temp 98.5 F (36.9 C)   Resp 16   Wt 63.2 kg   SpO2 96%   BMI 21.19 kg/m  Vital signs in last 24 hours: Temp:  [98.4 F (36.9 C)-98.5 F (36.9 C)] 98.5 F (36.9 C) (03/31 0441) Pulse Rate:  [55-68] 55 (03/31 0441) Resp:  [16-17] 16 (03/31 0441) BP: (141-150)/(68-69) 150/68 (03/31 0441) SpO2:  [95 %-96 %] 96 % (03/31 0441) Weight:  [63.2 kg] 63.2 kg (03/30 2046)  Intake/Output from previous day: 03/30 0701 - 03/31 0700 In: 1762 [P.O.:120; I.V.:1642] Out: 350 [Urine:350] Intake/Output this shift: No intake/output data recorded. Nutritional status:  Diet Order            Diet Heart Room service appropriate? Yes; Fluid consistency: Thin  Diet effective now             HEENT: [place]/AT Lungs: Respirations unlabored  Neurologic Exam: Ment: Awake and alert. Oriented to city, state, year and month but not to the day. Speech is fluent. Able to carry on a conversation. Pleasant and cooperative. Can remember the events directly preceding the time point when she had slumped in her shower-chair at home.  CN: EOMI. Face symmetric.  Motor: Moves all 4 extremities without asymmetry.   Lab Results: No results found for this or any previous visit (from the past 48 hour(s)).  Recent Results (from the past 240 hour(s))  SARS CORONAVIRUS 2 (TAT 6-24 HRS) Nasopharyngeal Nasopharyngeal Swab     Status: None   Collection Time: 05/15/19  7:48 PM   Specimen: Nasopharyngeal Swab  Result Value Ref Range Status   SARS Coronavirus 2 NEGATIVE NEGATIVE Final    Comment: (NOTE) SARS-CoV-2 target nucleic acids are NOT DETECTED. The SARS-CoV-2 RNA is generally detectable in upper and lower respiratory specimens during the acute phase of infection.  Negative results do not preclude SARS-CoV-2 infection, do not rule out co-infections with other pathogens, and should not be used as the sole basis for treatment or other patient management decisions. Negative results must be combined with clinical observations, patient history, and epidemiological information. The expected result is Negative. Fact Sheet for Patients: SugarRoll.be Fact Sheet for Healthcare Providers: https://www.woods-mathews.com/ This test is not yet approved or cleared by the [place] FDA and  has been authorized for detection and/or diagnosis of SARS-CoV-2 by FDA under an Emergency Use Authorization (EUA). This EUA will remain  in effect (meaning this test can be used) for the duration of the COVID-19 declaration under Section 56 4(b)(1) of the Act, 21 U.S.C. section 360bbb-3(b)(1), unless the authorization is terminated or revoked sooner. Performed at [facility], [place] [address] [place], [place] [address]     Lipid Panel No results for input(s): CHOL, TRIG, HDL, CHOLHDL, VLDL, LDLCALC in the last 72 hours.  Studies/Results: MR BRAIN WO CONTRAST  Result Date: 05/18/2019 CLINICAL DATA:  Seizure. Abnormal neuro exam. EXAM: MRI HEAD WITHOUT CONTRAST TECHNIQUE: Multiplanar, multiecho pulse sequences of the brain and surrounding structures were obtained without intravenous contrast. COMPARISON:  Head CT May 15 2019; MRI of the brain April 10, 2015 FINDINGS: Brain: No acute infarction, hemorrhage, hydrocephalus, extra-axial collection or mass lesion. Susceptibility artifact is noted along the ventricular system, predominantly in the temporal and occipital horns  of the right lateral ventricle with associated superficial siderosis along the cerebral and most prominently along the the cerebellar and brainstem surface. These findings are secondary to prior intraventricular hemorrhage documented on CT of the head  performed on December 18, 2017. There is associated encephalomalacia and gliosis in the anterior right temporal lobe with decreased volume and superficial siderosis of the right hippocampus. He punctate susceptibility artifacts are also seen in the bilateral basal ganglia, left occipital lobe and pons, likely corresponding to small hemosiderin deposit. Extensive scattered and confluent T2 hyperintensities within the white matter of the cerebral hemispheres, nonspecific, significantly progressed when compared to prior MRI performed 2017. There is diffuse prominence of the cerebral sulci, particularly in the bilateral parietal regions with prominence of the cerebellar sulci and ventricular system, reflecting parenchymal volume loss. Vascular: Normal flow voids. Skull and upper cervical spine: Normal marrow signal. Sinuses/Orbits: Negative. Other: None. IMPRESSION: 1. No acute intracranial abnormality. 2. Encephalomalacia and gliosis in the anterior right temporal lobe with associated right hippocampus volume loss and superficial siderosis. 3. 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 4. Extensive cerebral white matter disease, likely related to advance chronic small vessel ischemic changes, significantly progressed when compared to prior MRI performed on December 18, 2017. 5. Diffuse cerebral volume loss, particularly in the bilateral parietal regions. Electronically Signed   By: [name] M.D.   On: 05/18/2019 10:18   EEG adult  Result Date: 05/18/2019 Sonya Havens, MD     05/18/2019  8:58 AM Patient Name: [name] MRN: [id number] Epilepsy Attending: [name] Referring Physician/Provider: Dr [name] Date: 05/17/2019 Duration: 23.53 mins Patient history: 86 year old female with episode of prolonged  unresponsiveness. EEG to evaluate for seizure. Level of alertness: awake AEDs during EEG study: LEV Technical aspects: This EEG study was done with scalp electrodes positioned according to the 10-20 International system of electrode placement. Electrical activity was acquired at a sampling rate of 500Hz  and reviewed with a high frequency filter of 70Hz  and a low frequency filter of 1Hz . EEG data were recorded continuously and digitally stored. DESCRIPTION: The posterior dominant rhythm consists of 7Hz  activity of moderate voltage (25-35 uV) seen predominantly in posterior head regions, symmetric and reactive to eye opening and eye closing. EEG showed intermittent generalized 3-5hz  theta-delta slowing.  Hyperventilation and photic stimulation were not performed. ABNORMALITY - Intermittent slow, generalized - Background slow IMPRESSION: This study is suggestive of mild diffuse encephalopathy, non specific to etiology. No seizures or epileptiform discharges were seen throughout the recording. Priyanka Barbra Sarks    Medications:  Scheduled: . irbesartan  150 mg Oral Daily  . levETIRAcetam  500 mg Oral Daily  . pantoprazole  40 mg Oral QAC breakfast  . sertraline  50 mg Oral Daily   Continuous: . sodium chloride 75 mL/hr at 05/18/19 0153   Assessment: 86 year old female presenting to the hospital after an episode of prolonged unresponsiveness. Overall presentation is most consistent with a cognitive fluctuation in the setting of undiagnosed Lewy body dementia.  1. Has symptoms of reduplicative paramnesia (Capgras syndrome), visual hallucinations and waxing/waning mental status. No abnormal movements during sleep and gait is not shuffling, per daughter. Overall symptoms in the context of cognitive decline are suggestive of Lewy body dementia.  2. The spell in the shower at home as described by the daughter is most likely  a cognitive fluctuation secondary to LBD. No jerking or twitching to suggest a seizure.  Syncope unlikely given the duration of her LOC.  3. Today, the patient appears to be cognitively back to baseline. This is further evidence for cognitive fluctuations exhibiting a pattern that is consistent with that seen in LBD.  4. EEG report (3/31): Findings: Intermittent slow, generalized. Background slow. IMPRESSION: This study is suggestive of mild diffuse encephalopathy, non specific to etiology. No seizures or epileptiform discharges were seen throughout the recording. 5. MRI shows no acute intracranial abnormality. There is encephalomalacia and gliosis in the anterior right temporal lobe with associated right hippocampus volume loss and superficial siderosis. 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 Extensive cerebral white matter disease, likely related to advanced chronic small vessel ischemic changes, significantly progressed when compared to prior MRI performed on December 18, 2017. Diffuse cerebral volume loss, particularly in the bilateral parietal regions.    Recommendations: --Avoid antipsychotic medications, which can worsen symptoms of LBD. Consider discontinuation of olanzapine. If an antipsychotic with sedating properties is needed, Seroquel would be a better alternative.  --Consider a trial of Aricept, 5 mg po qhs --Will need outpatient Neurology follow up for evaluation of possible Lewy body dementia --Neurology will sign off. Please call if there are additional questions.     LOS: 2 days   @Electronically  signed: Dr. Kerney Elbe 05/18/2019  12:30 PM

## 2019-05-23 ENCOUNTER — Other Ambulatory Visit: Payer: Self-pay | Admitting: *Deleted

## 2019-05-23 ENCOUNTER — Encounter: Payer: Self-pay | Admitting: *Deleted

## 2019-05-23 NOTE — Patient Outreach (Signed)
[facility]) Care Management [place] Telephone Outreach- EMMI Red-Alert notification/ General Discharge PCP office completes Transition of Care follow up post-hospital discharge Post-hospital discharge day # 5  05/23/2019  AINSLEY RUNSER 02/15/1927 [id number]  EMMI Red-Alert notification/ General discharge EMMI call date/ day # 1 Red-Alert reason(s):  "no scheduled follow up"  Successful telephone outreach to [facility], daughter/ caregiver of Norma Zakarian, 86 y/o female referred to [place] by [facility] CMA this morning after [place] notification as above; patient has recent hospitalization for observation March 28-31, 2021 for syncope; patient was ruled out for CVA/ acute pathology and discharged home to self-care without home health services in place.  Patient has history including, but not limited to, CAD; A-Fib; HTN/ HLD; previous CVA; GERD; and anxiety.  Daughter reports that patient has had dementia since her CVA in 2019.  Reports patient has been living with her and she is primary caregiver of patient.  Noted that caregiver is included in [place] in EHR.  HIPAA/ identity verified and purpose of call was discussed with caregiver; caregiver reports patient "doing well," post-hospital discharge and she denies that patient is in pain/ had new/ recent falls; reports patient is back to baseline after recent hospitalization.  Caregiver denies concerns/ issues/ problems post-hospital discharge.  She agrees to complete EMMI screening call, but she declines ongoing [facility] RN CM outreach stating no needs; caregiver reports she has been caring for patient for several years and states "eveything is going well and is in place."  Caregiver reports that she has not yet scheduled post-hospital discharge provider appointments due to it "being a holiday weekend;" states that she understands and plans to schedule these appointments tomorrow.  Caregiver further  reports:  Medications: -- Has all medicationsand takes as prescribed;denies questions/ concerns about current medications -- Verbalizes good general understanding of the purpose, dosing, and scheduling of medications   -- caregiver manages patient's medications and provides to patient daily -- denies issues with swallowing medications, but states that she uses applesauce for larger pills, which patient tolerates well -- post-hospital discharge medication changes were reviewed with caregiver who reports she has obtained all newly prescribed medications and has discontinued medications that were to be stopped; she declines full medication review but is able to accurately verbalize changes to medications post-hospital discharge  Safety/ Mobility/ Falls: -- denies new/ recent falls; states patient has been using walker for several years and she denies falls over last 3 months; states that recent syncopal episode occurred while patient was seated -- assistive devices: uses walker "all the time;" confirms has a wheelchair if needed -- general fall risks/ prevention education discussed with caregiver today  [place] needs: -- currently denies community resource needs, stating supportive local family members that are able to assist with care needs as indicated; reports patient is essentially independent with ADL's and requires only occasional minimal assistance -- caregiver provides transportation for patient to all provider appointments, errands, etc -- SDOH completed for: depression/ transportation/ food insecurity- no concerns identified -- caregiver verbalizes ability to safely care for patient at home; states that she is considering hiring intermittent private duty sitters once the corona virus pandemic is under better control; declines caregiver stress and reports that she is not overwhelmed; states she can not think of anything that patient needs that she does not already have  in place at home  Advanced Directive (AD) Planning:   --reports does have exisisting AD in place for HCPOA/ Living  Will; reports patient is a DNR; declines desire to make changes  Caregiver denies further issues, concerns, or problems today.  I provided/ confirmed that patient has my direct phone number, the main [facility] CM office phone number, and the [facility] CM 24-hour nurse advice phone number should issues arise and patient/ caregiver decide they would like to participate in [place] program; caregiver appreciative of today's screening call.  Explained to caregiver that if patient receives another EMMI Red-alert notification, they would receive another call; she verbalizes understanding  Plan:  Will make patient inactive with [facility] CM as caregiver declines ongoing [facility] CM follow up, stating no care management/ coordination needs and screening call did not identify any areas of concern; will make patient's PCP aware of same  Oneta Rack, RN, BSN, [place] Coordinator [facility] Care Management  [phone]

## 2019-05-24 ENCOUNTER — Encounter: Payer: Self-pay | Admitting: Neurology

## 2019-06-01 DIAGNOSIS — G3183 Dementia with Lewy bodies: Secondary | ICD-10-CM | POA: Diagnosis not present

## 2019-06-01 DIAGNOSIS — R410 Disorientation, unspecified: Secondary | ICD-10-CM | POA: Diagnosis not present

## 2019-06-01 DIAGNOSIS — I10 Essential (primary) hypertension: Secondary | ICD-10-CM | POA: Diagnosis not present

## 2019-06-01 DIAGNOSIS — R531 Weakness: Secondary | ICD-10-CM | POA: Diagnosis not present

## 2019-06-01 DIAGNOSIS — F0280 Dementia in other diseases classified elsewhere without behavioral disturbance: Secondary | ICD-10-CM | POA: Diagnosis not present

## 2019-06-02 DIAGNOSIS — I10 Essential (primary) hypertension: Secondary | ICD-10-CM | POA: Diagnosis not present

## 2019-06-02 DIAGNOSIS — N390 Urinary tract infection, site not specified: Secondary | ICD-10-CM | POA: Diagnosis not present

## 2019-06-07 DIAGNOSIS — G3183 Dementia with Lewy bodies: Secondary | ICD-10-CM | POA: Diagnosis not present

## 2019-06-07 DIAGNOSIS — R42 Dizziness and giddiness: Secondary | ICD-10-CM | POA: Diagnosis not present

## 2019-06-07 DIAGNOSIS — I4891 Unspecified atrial fibrillation: Secondary | ICD-10-CM | POA: Diagnosis not present

## 2019-06-07 DIAGNOSIS — I10 Essential (primary) hypertension: Secondary | ICD-10-CM | POA: Diagnosis not present

## 2019-06-07 DIAGNOSIS — M150 Primary generalized (osteo)arthritis: Secondary | ICD-10-CM | POA: Diagnosis not present

## 2019-06-07 DIAGNOSIS — I2510 Atherosclerotic heart disease of native coronary artery without angina pectoris: Secondary | ICD-10-CM | POA: Diagnosis not present

## 2019-06-07 DIAGNOSIS — Z9181 History of falling: Secondary | ICD-10-CM | POA: Diagnosis not present

## 2019-06-07 DIAGNOSIS — F0281 Dementia in other diseases classified elsewhere with behavioral disturbance: Secondary | ICD-10-CM | POA: Diagnosis not present

## 2019-06-08 DIAGNOSIS — F0281 Dementia in other diseases classified elsewhere with behavioral disturbance: Secondary | ICD-10-CM | POA: Diagnosis not present

## 2019-06-08 DIAGNOSIS — I4891 Unspecified atrial fibrillation: Secondary | ICD-10-CM | POA: Diagnosis not present

## 2019-06-08 DIAGNOSIS — I2510 Atherosclerotic heart disease of native coronary artery without angina pectoris: Secondary | ICD-10-CM | POA: Diagnosis not present

## 2019-06-08 DIAGNOSIS — G3183 Dementia with Lewy bodies: Secondary | ICD-10-CM | POA: Diagnosis not present

## 2019-06-08 DIAGNOSIS — I10 Essential (primary) hypertension: Secondary | ICD-10-CM | POA: Diagnosis not present

## 2019-06-08 DIAGNOSIS — M150 Primary generalized (osteo)arthritis: Secondary | ICD-10-CM | POA: Diagnosis not present

## 2019-06-09 DIAGNOSIS — I2510 Atherosclerotic heart disease of native coronary artery without angina pectoris: Secondary | ICD-10-CM | POA: Diagnosis not present

## 2019-06-09 DIAGNOSIS — M150 Primary generalized (osteo)arthritis: Secondary | ICD-10-CM | POA: Diagnosis not present

## 2019-06-09 DIAGNOSIS — I4891 Unspecified atrial fibrillation: Secondary | ICD-10-CM | POA: Diagnosis not present

## 2019-06-09 DIAGNOSIS — I10 Essential (primary) hypertension: Secondary | ICD-10-CM | POA: Diagnosis not present

## 2019-06-09 DIAGNOSIS — G3183 Dementia with Lewy bodies: Secondary | ICD-10-CM | POA: Diagnosis not present

## 2019-06-09 DIAGNOSIS — F0281 Dementia in other diseases classified elsewhere with behavioral disturbance: Secondary | ICD-10-CM | POA: Diagnosis not present

## 2019-06-13 DIAGNOSIS — I10 Essential (primary) hypertension: Secondary | ICD-10-CM | POA: Diagnosis not present

## 2019-06-13 DIAGNOSIS — M150 Primary generalized (osteo)arthritis: Secondary | ICD-10-CM | POA: Diagnosis not present

## 2019-06-13 DIAGNOSIS — G3183 Dementia with Lewy bodies: Secondary | ICD-10-CM | POA: Diagnosis not present

## 2019-06-13 DIAGNOSIS — F0281 Dementia in other diseases classified elsewhere with behavioral disturbance: Secondary | ICD-10-CM | POA: Diagnosis not present

## 2019-06-13 DIAGNOSIS — I4891 Unspecified atrial fibrillation: Secondary | ICD-10-CM | POA: Diagnosis not present

## 2019-06-13 DIAGNOSIS — I2510 Atherosclerotic heart disease of native coronary artery without angina pectoris: Secondary | ICD-10-CM | POA: Diagnosis not present

## 2019-06-15 DIAGNOSIS — I4891 Unspecified atrial fibrillation: Secondary | ICD-10-CM | POA: Diagnosis not present

## 2019-06-15 DIAGNOSIS — F0281 Dementia in other diseases classified elsewhere with behavioral disturbance: Secondary | ICD-10-CM | POA: Diagnosis not present

## 2019-06-15 DIAGNOSIS — M150 Primary generalized (osteo)arthritis: Secondary | ICD-10-CM | POA: Diagnosis not present

## 2019-06-15 DIAGNOSIS — I10 Essential (primary) hypertension: Secondary | ICD-10-CM | POA: Diagnosis not present

## 2019-06-15 DIAGNOSIS — I2510 Atherosclerotic heart disease of native coronary artery without angina pectoris: Secondary | ICD-10-CM | POA: Diagnosis not present

## 2019-06-15 DIAGNOSIS — G3183 Dementia with Lewy bodies: Secondary | ICD-10-CM | POA: Diagnosis not present

## 2019-06-16 DIAGNOSIS — I10 Essential (primary) hypertension: Secondary | ICD-10-CM | POA: Diagnosis not present

## 2019-06-16 DIAGNOSIS — F0281 Dementia in other diseases classified elsewhere with behavioral disturbance: Secondary | ICD-10-CM | POA: Diagnosis not present

## 2019-06-16 DIAGNOSIS — I2510 Atherosclerotic heart disease of native coronary artery without angina pectoris: Secondary | ICD-10-CM | POA: Diagnosis not present

## 2019-06-16 DIAGNOSIS — I4891 Unspecified atrial fibrillation: Secondary | ICD-10-CM | POA: Diagnosis not present

## 2019-06-16 DIAGNOSIS — M150 Primary generalized (osteo)arthritis: Secondary | ICD-10-CM | POA: Diagnosis not present

## 2019-06-16 DIAGNOSIS — G3183 Dementia with Lewy bodies: Secondary | ICD-10-CM | POA: Diagnosis not present

## 2019-06-17 DIAGNOSIS — G3183 Dementia with Lewy bodies: Secondary | ICD-10-CM | POA: Diagnosis not present

## 2019-06-17 DIAGNOSIS — F0281 Dementia in other diseases classified elsewhere with behavioral disturbance: Secondary | ICD-10-CM | POA: Diagnosis not present

## 2019-06-17 DIAGNOSIS — I10 Essential (primary) hypertension: Secondary | ICD-10-CM | POA: Diagnosis not present

## 2019-06-17 DIAGNOSIS — M150 Primary generalized (osteo)arthritis: Secondary | ICD-10-CM | POA: Diagnosis not present

## 2019-06-17 DIAGNOSIS — I4891 Unspecified atrial fibrillation: Secondary | ICD-10-CM | POA: Diagnosis not present

## 2019-06-17 DIAGNOSIS — I2510 Atherosclerotic heart disease of native coronary artery without angina pectoris: Secondary | ICD-10-CM | POA: Diagnosis not present

## 2019-06-20 DIAGNOSIS — I10 Essential (primary) hypertension: Secondary | ICD-10-CM | POA: Diagnosis not present

## 2019-06-20 DIAGNOSIS — I2510 Atherosclerotic heart disease of native coronary artery without angina pectoris: Secondary | ICD-10-CM | POA: Diagnosis not present

## 2019-06-20 DIAGNOSIS — M150 Primary generalized (osteo)arthritis: Secondary | ICD-10-CM | POA: Diagnosis not present

## 2019-06-20 DIAGNOSIS — F0281 Dementia in other diseases classified elsewhere with behavioral disturbance: Secondary | ICD-10-CM | POA: Diagnosis not present

## 2019-06-20 DIAGNOSIS — I4891 Unspecified atrial fibrillation: Secondary | ICD-10-CM | POA: Diagnosis not present

## 2019-06-20 DIAGNOSIS — G3183 Dementia with Lewy bodies: Secondary | ICD-10-CM | POA: Diagnosis not present

## 2019-06-22 DIAGNOSIS — I4891 Unspecified atrial fibrillation: Secondary | ICD-10-CM | POA: Diagnosis not present

## 2019-06-22 DIAGNOSIS — F0281 Dementia in other diseases classified elsewhere with behavioral disturbance: Secondary | ICD-10-CM | POA: Diagnosis not present

## 2019-06-22 DIAGNOSIS — M150 Primary generalized (osteo)arthritis: Secondary | ICD-10-CM | POA: Diagnosis not present

## 2019-06-22 DIAGNOSIS — I2510 Atherosclerotic heart disease of native coronary artery without angina pectoris: Secondary | ICD-10-CM | POA: Diagnosis not present

## 2019-06-22 DIAGNOSIS — G3183 Dementia with Lewy bodies: Secondary | ICD-10-CM | POA: Diagnosis not present

## 2019-06-22 DIAGNOSIS — I10 Essential (primary) hypertension: Secondary | ICD-10-CM | POA: Diagnosis not present

## 2019-06-23 DIAGNOSIS — I4891 Unspecified atrial fibrillation: Secondary | ICD-10-CM | POA: Diagnosis not present

## 2019-06-23 DIAGNOSIS — M150 Primary generalized (osteo)arthritis: Secondary | ICD-10-CM | POA: Diagnosis not present

## 2019-06-23 DIAGNOSIS — F0281 Dementia in other diseases classified elsewhere with behavioral disturbance: Secondary | ICD-10-CM | POA: Diagnosis not present

## 2019-06-23 DIAGNOSIS — G3183 Dementia with Lewy bodies: Secondary | ICD-10-CM | POA: Diagnosis not present

## 2019-06-23 DIAGNOSIS — I2510 Atherosclerotic heart disease of native coronary artery without angina pectoris: Secondary | ICD-10-CM | POA: Diagnosis not present

## 2019-06-23 DIAGNOSIS — I10 Essential (primary) hypertension: Secondary | ICD-10-CM | POA: Diagnosis not present

## 2019-06-28 DIAGNOSIS — G3183 Dementia with Lewy bodies: Secondary | ICD-10-CM | POA: Diagnosis not present

## 2019-06-28 DIAGNOSIS — I2510 Atherosclerotic heart disease of native coronary artery without angina pectoris: Secondary | ICD-10-CM | POA: Diagnosis not present

## 2019-06-28 DIAGNOSIS — I10 Essential (primary) hypertension: Secondary | ICD-10-CM | POA: Diagnosis not present

## 2019-06-28 DIAGNOSIS — M150 Primary generalized (osteo)arthritis: Secondary | ICD-10-CM | POA: Diagnosis not present

## 2019-06-28 DIAGNOSIS — F0281 Dementia in other diseases classified elsewhere with behavioral disturbance: Secondary | ICD-10-CM | POA: Diagnosis not present

## 2019-06-28 DIAGNOSIS — I4891 Unspecified atrial fibrillation: Secondary | ICD-10-CM | POA: Diagnosis not present

## 2019-06-30 ENCOUNTER — Other Ambulatory Visit: Payer: Self-pay

## 2019-06-30 ENCOUNTER — Inpatient Hospital Stay (HOSPITAL_COMMUNITY)
Admission: EM | Admit: 2019-06-30 | Discharge: 2019-07-03 | DRG: 200 | Disposition: A | Discharge status: Home Health | Payer: Medicare Other | Attending: Physician Assistant | Admitting: Physician Assistant

## 2019-06-30 ENCOUNTER — Emergency Department (HOSPITAL_COMMUNITY): Payer: Medicare Other

## 2019-06-30 DIAGNOSIS — I959 Hypotension, unspecified: Secondary | ICD-10-CM | POA: Diagnosis not present

## 2019-06-30 DIAGNOSIS — E739 Lactose intolerance, unspecified: Secondary | ICD-10-CM | POA: Diagnosis present

## 2019-06-30 DIAGNOSIS — I482 Chronic atrial fibrillation, unspecified: Secondary | ICD-10-CM | POA: Diagnosis present

## 2019-06-30 DIAGNOSIS — W010XXA Fall on same level from slipping, tripping and stumbling without subsequent striking against object, initial encounter: Secondary | ICD-10-CM | POA: Diagnosis present

## 2019-06-30 DIAGNOSIS — I4821 Permanent atrial fibrillation: Secondary | ICD-10-CM | POA: Diagnosis not present

## 2019-06-30 DIAGNOSIS — S270XXA Traumatic pneumothorax, initial encounter: Secondary | ICD-10-CM

## 2019-06-30 DIAGNOSIS — Z79899 Other long term (current) drug therapy: Secondary | ICD-10-CM

## 2019-06-30 DIAGNOSIS — Z90710 Acquired absence of both cervix and uterus: Secondary | ICD-10-CM

## 2019-06-30 DIAGNOSIS — R0902 Hypoxemia: Secondary | ICD-10-CM | POA: Diagnosis not present

## 2019-06-30 DIAGNOSIS — Z87891 Personal history of nicotine dependence: Secondary | ICD-10-CM | POA: Diagnosis not present

## 2019-06-30 DIAGNOSIS — I272 Pulmonary hypertension, unspecified: Secondary | ICD-10-CM | POA: Diagnosis present

## 2019-06-30 DIAGNOSIS — G3183 Dementia with Lewy bodies: Secondary | ICD-10-CM | POA: Diagnosis not present

## 2019-06-30 DIAGNOSIS — S2242XA Multiple fractures of ribs, left side, initial encounter for closed fracture: Secondary | ICD-10-CM | POA: Diagnosis not present

## 2019-06-30 DIAGNOSIS — M5489 Other dorsalgia: Secondary | ICD-10-CM | POA: Diagnosis not present

## 2019-06-30 DIAGNOSIS — S272XXA Traumatic hemopneumothorax, initial encounter: Secondary | ICD-10-CM | POA: Diagnosis not present

## 2019-06-30 DIAGNOSIS — Z03818 Encounter for observation for suspected exposure to other biological agents ruled out: Secondary | ICD-10-CM | POA: Diagnosis not present

## 2019-06-30 DIAGNOSIS — R52 Pain, unspecified: Secondary | ICD-10-CM | POA: Diagnosis not present

## 2019-06-30 DIAGNOSIS — K219 Gastro-esophageal reflux disease without esophagitis: Secondary | ICD-10-CM | POA: Diagnosis present

## 2019-06-30 DIAGNOSIS — R0781 Pleurodynia: Secondary | ICD-10-CM | POA: Diagnosis not present

## 2019-06-30 DIAGNOSIS — Z20822 Contact with and (suspected) exposure to covid-19: Secondary | ICD-10-CM | POA: Diagnosis present

## 2019-06-30 DIAGNOSIS — Z955 Presence of coronary angioplasty implant and graft: Secondary | ICD-10-CM

## 2019-06-30 DIAGNOSIS — M150 Primary generalized (osteo)arthritis: Secondary | ICD-10-CM | POA: Diagnosis not present

## 2019-06-30 DIAGNOSIS — I4891 Unspecified atrial fibrillation: Secondary | ICD-10-CM | POA: Diagnosis not present

## 2019-06-30 DIAGNOSIS — F0280 Dementia in other diseases classified elsewhere without behavioral disturbance: Secondary | ICD-10-CM | POA: Diagnosis present

## 2019-06-30 DIAGNOSIS — J939 Pneumothorax, unspecified: Secondary | ICD-10-CM | POA: Diagnosis present

## 2019-06-30 DIAGNOSIS — Y92002 Bathroom of unspecified non-institutional (private) residence single-family (private) house as the place of occurrence of the external cause: Secondary | ICD-10-CM

## 2019-06-30 DIAGNOSIS — I2510 Atherosclerotic heart disease of native coronary artery without angina pectoris: Secondary | ICD-10-CM | POA: Diagnosis present

## 2019-06-30 DIAGNOSIS — I10 Essential (primary) hypertension: Secondary | ICD-10-CM | POA: Diagnosis not present

## 2019-06-30 DIAGNOSIS — N390 Urinary tract infection, site not specified: Secondary | ICD-10-CM | POA: Diagnosis not present

## 2019-06-30 DIAGNOSIS — F0281 Dementia in other diseases classified elsewhere with behavioral disturbance: Secondary | ICD-10-CM | POA: Diagnosis not present

## 2019-06-30 DIAGNOSIS — S3991XA Unspecified injury of abdomen, initial encounter: Secondary | ICD-10-CM | POA: Diagnosis not present

## 2019-06-30 DIAGNOSIS — E782 Mixed hyperlipidemia: Secondary | ICD-10-CM | POA: Diagnosis present

## 2019-06-30 LAB — COMPREHENSIVE METABOLIC PANEL
ALT: 13 U/L (ref 0–44)
AST: 21 U/L (ref 15–41)
Albumin: 3.9 g/dL (ref 3.5–5.0)
Alkaline Phosphatase: 64 U/L (ref 38–126)
Anion gap: 10 (ref 5–15)
BUN: 21 mg/dL (ref 8–23)
CO2: 27 mmol/L (ref 22–32)
Calcium: 8.8 mg/dL — ABNORMAL LOW (ref 8.9–10.3)
Chloride: 104 mmol/L (ref 98–111)
Creatinine, Ser: 0.77 mg/dL (ref 0.44–1.00)
GFR calc Af Amer: >60 mL/min (ref >60)
GFR calc non Af Amer: >60 mL/min (ref >60)
Glucose, Bld: 127 mg/dL — ABNORMAL HIGH (ref 70–99)
Potassium: 3.8 mmol/L (ref 3.5–5.1)
Sodium: 141 mmol/L (ref 135–145)
Total Bilirubin: 0.7 mg/dL (ref 0.3–1.2)
Total Protein: 6.8 g/dL (ref 6.5–8.1)

## 2019-06-30 LAB — CBC WITH DIFFERENTIAL/PLATELET
Abs Immature Granulocytes: 0.12 10*3/uL — ABNORMAL HIGH (ref 0.00–0.07)
Basophils Absolute: 0.1 10*3/uL (ref 0.0–0.1)
Basophils Relative: 1 %
Eosinophils Absolute: 0.1 10*3/uL (ref 0.0–0.5)
Eosinophils Relative: 1 %
HCT: 36 % (ref 36.0–46.0)
Hemoglobin: 11.8 g/dL — ABNORMAL LOW (ref 12.0–15.0)
Immature Granulocytes: 1 %
Lymphocytes Relative: 9 %
Lymphs Abs: 1.4 10*3/uL (ref 0.7–4.0)
MCH: 31.2 pg (ref 26.0–34.0)
MCHC: 32.8 g/dL (ref 30.0–36.0)
MCV: 95.2 fL (ref 80.0–100.0)
Monocytes Absolute: 1.3 10*3/uL — ABNORMAL HIGH (ref 0.1–1.0)
Monocytes Relative: 8 %
Neutro Abs: 12 10*3/uL — ABNORMAL HIGH (ref 1.7–7.7)
Neutrophils Relative %: 80 %
Platelets: 213 10*3/uL (ref 150–400)
RBC: 3.78 MIL/uL — ABNORMAL LOW (ref 3.87–5.11)
RDW: 13.2 % (ref 11.5–15.5)
WBC: 15 10*3/uL — ABNORMAL HIGH (ref 4.0–10.5)
nRBC: 0 % (ref 0.0–0.2)

## 2019-06-30 LAB — PROTIME-INR
INR: 1 (ref 0.8–1.2)
Prothrombin Time: 13 seconds (ref 11.4–15.2)

## 2019-06-30 LAB — LIPASE, BLOOD: Lipase: 17 U/L (ref 11–51)

## 2019-06-30 MED ORDER — ACETAMINOPHEN 500 MG PO TABS
1000.0000 mg | ORAL_TABLET | Freq: Once | ORAL | Status: AC
  Administered 2019-06-30: 1000 mg via ORAL
  Filled 2019-06-30: qty 2

## 2019-06-30 MED ORDER — SODIUM CHLORIDE 0.9 % IV SOLN: Freq: Once | INTRAVENOUS | Status: AC

## 2019-06-30 NOTE — H&P (Signed)
[name] is an 86 y.o. female.   Chief Complaint: Fall HPI: The patient is a 86 year old white female who fell this evening in the bathroom and struck her left side on the tub and toilet.  She initially did have some left-sided chest pain but this seems to have resolved with a couple Tylenol.  She denies any shortness of breath.  She has had several falls in the last few months.  She lives at home with her daughter.  She does have some underlying dementia and atrial fibrillation.  she is not on blood thinners.  Past Medical History:  Diagnosis Date  . Ankle fracture   . Anxiety   . Arthritis   . Barrett's esophagus   . Chronic atrial fibrillation ([place])    a. Pt previously declined DCCV.  Marland Kitchen Coronary artery disease    a. s/p Cypher DES to LAD in 2006;  b. Last LHC 02/2008:  pLAD 30%, mLAD stent patent, pOM 50%, EF 65%;  c. Lexiscan Myoview 7/14:  Intermediate risk, dist ant and inf-lat ischemia, EF 66% - patient declined cath and elected medical therapy.  Marland Kitchen GERD (gastroesophageal reflux disease)   . Hiatal hernia   . Hx of echocardiogram    Echocardiogram 08/17/12: EF 55-60%, MAC, mild MR, mild LAE, mild RVE, mild to moderate RAE, moderate TR, mildly increased pulmonary artery systolic pressures  . Hyperlipidemia   . Hypertension   . Mitral regurgitation    a. Mod by echo 10/2014.  . Obesity   . Pulmonary hypertension ([place])    a. Mod-severely elevated PA pressures by echo 10/2014.  Marland Kitchen QT prolongation    a. During 10/2014 admit, QTC 535m.  . Tricuspid regurgitation    a. Mod by echo 10/2014.  .Marland KitchenUTI (lower urinary tract infection) 03/2015    Past Surgical History:  Procedure Laterality Date  . ABDOMINAL HYSTERECTOMY    . ANKLE SURGERY  2001  . BREAST EXCISIONAL BIOPSY Right 1970  . BREAST EXCISIONAL BIOPSY Right   . BREAST EXCISIONAL BIOPSY Left   . CESAREAN SECTION    . CORONARY ANGIOPLASTY    . ESOPHAGOGASTRODUODENOSCOPY      Family History  Problem Relation Age of Onset   . Diabetes Mother   . Heart attack Mother   . Lung cancer Father   . Thyroid disease Sister   . Thyroid disease Sister   . Hypertension Sister   . Hypertension Sister   . Hypertension Sister   . Hypertension Sister   . Hypertension Brother   . Hypertension Brother   . Breast cancer Other 45  . Colon cancer Neg Hx   . Colon polyps Neg Hx   . Esophageal cancer Neg Hx   . Stroke Neg Hx    Social History:  reports that she has quit smoking. Her smoking use included cigarettes. She has never used smokeless tobacco. She reports that she does not drink alcohol or use drugs.  Allergies:  Allergies  Allergen Reactions  . Amlodipine Shortness Of Breath, Swelling and Other (See Comments)    Edema in legs  . Methyldopa Shortness Of Breath and Swelling  . Shellfish-Derived Products Other (See Comments)    Syncopal episode  . Tape Itching and Other (See Comments)    Also TEARS THE SKIN, SO PLEASE USE AN ALTERNATIVE!!  .Marland KitchenActonel [Risedronate Sodium] Other (See Comments)    Caused a lump to come up in throat and caused heartburn  . Bactrim [Sulfamethoxazole-Trimethoprim] Itching  . Ciprofloxacin  Hcl Other (See Comments)    Confusion   . Gas-X [Simethicone] Itching  . Hydralazine Hcl Other (See Comments)    Unknown reaction, per patient   . Lactose Intolerance (Gi) Diarrhea  . Lisinopril Cough  . Milk-Related Compounds Diarrhea  . Other Other (See Comments)    Orange juice : itching  . Oxycodone Hcl Other (See Comments)    Hallucinations   . Pneumococcal Vaccines Swelling and Other (See Comments)    Arm swells  . Valsartan Other (See Comments)    Unknown reaction, per pt  . Cozaar [Losartan] Rash  . Digitek [Digoxin] Rash  . Felodipine Rash  . Hctz [Hydrochlorothiazide] Itching and Rash    Pt reports causing "itching under the skin"  . Simvastatin Rash  . Spironolactone Itching and Rash    (Not in a hospital admission)   Results for orders placed or performed during  the hospital encounter of 06/30/19 (from the past 48 hour(s))  Comprehensive metabolic panel     Status: Abnormal   Collection Time: 06/30/19  8:27 PM  Result Value Ref Range   Sodium 141 135 - 145 mmol/L   Potassium 3.8 3.5 - 5.1 mmol/L   Chloride 104 98 - 111 mmol/L   CO2 27 22 - 32 mmol/L   Glucose, Bld 127 (H) 70 - 99 mg/dL    Comment: Glucose reference range applies only to samples taken after fasting for at least 8 hours.   BUN 21 8 - 23 mg/dL   Creatinine, Ser 0.77 0.44 - 1.00 mg/dL   Calcium 8.8 (L) 8.9 - 10.3 mg/dL   Total Protein 6.8 6.5 - 8.1 g/dL   Albumin 3.9 3.5 - 5.0 g/dL   AST 21 15 - 41 U/L   ALT 13 0 - 44 U/L   Alkaline Phosphatase 64 38 - 126 U/L   Total Bilirubin 0.7 0.3 - 1.2 mg/dL   GFR calc non Af Amer >60 >60 mL/min   GFR calc Af Amer >60 >60 mL/min   Anion gap 10 5 - 15    Comment: Performed at [facility], [place] [address] [place], [place] [address]  Lipase, blood     Status: None   Collection Time: 06/30/19  8:27 PM  Result Value Ref Range   Lipase 17 11 - 51 U/L    Comment: Performed at [facility], [place] [address] [place], [place] [address]  CBC with Differential     Status: Abnormal   Collection Time: 06/30/19  8:27 PM  Result Value Ref Range   WBC 15.0 (H) 4.0 - 10.5 K/uL   RBC 3.78 (L) 3.87 - 5.11 MIL/uL   Hemoglobin 11.8 (L) 12.0 - 15.0 g/dL   HCT 36.0 36.0 - 46.0 %   MCV 95.2 80.0 - 100.0 fL   MCH 31.2 26.0 - 34.0 pg   MCHC 32.8 30.0 - 36.0 g/dL   RDW 13.2 11.5 - 15.5 %   Platelets 213 150 - 400 K/uL   nRBC 0.0 0.0 - 0.2 %   Neutrophils Relative % 80 %   Neutro Abs 12.0 (H) 1.7 - 7.7 K/uL   Lymphocytes Relative 9 %   Lymphs Abs 1.4 0.7 - 4.0 K/uL   Monocytes Relative 8 %   Monocytes Absolute 1.3 (H) 0.1 - 1.0 K/uL   Eosinophils Relative 1 %   Eosinophils Absolute 0.1 0.0 - 0.5 K/uL   Basophils Relative 1 %   Basophils Absolute 0.1 0.0 - 0.1  K/uL   Immature Granulocytes 1 %   Abs Immature  Granulocytes 0.12 (H) 0.00 - 0.07 K/uL    Comment: Performed at [facility], [place] [address], [place], [place] [address]  Protime-INR     Status: None   Collection Time: 06/30/19  8:27 PM  Result Value Ref Range   Prothrombin Time 13.0 11.4 - 15.2 seconds   INR 1.0 0.8 - 1.2    Comment: (NOTE) INR goal varies based on device and disease states. Performed at [facility], [place] [address], [place], [place] [address]    CT Abdomen Pelvis Wo Contrast  Result Date: 06/30/2019 CLINICAL DATA:  Mechanical fall .  Chest pain. EXAM: CT CHEST, ABDOMEN AND PELVIS WITHOUT CONTRAST TECHNIQUE: Multidetector CT imaging of the chest, abdomen and pelvis was performed following the standard protocol without IV contrast. COMPARISON:  None. FINDINGS: CT CHEST FINDINGS Cardiovascular: No mediastinal hematoma. Coronary artery calcification and aortic atherosclerotic calcification. Mediastinum/Nodes: mediastinum is normal. Esophagus and trachea normal. Lungs/Pleura: There is small volume pneumothorax at the medial LEFT apex (image 10/2) as well as anteromedial anterior to the lingula (image 35/2 in 45/2). Pneumothorax associated with posterior LEFT rib fractures. Musculoskeletal: Fracture of the posterior LEFT seventh, eighth and ninth ribs (image 32 and 39 of series 2). Anterior LEFT sixth and seventh rib fractures (image 45/2) There is small amount gas within the chest wall superficial to the ribs and beneath the shoulder musculature (image 47/2. Small pleural thickening in the LEFT lower lobe. CT ABDOMEN AND PELVIS FINDINGS Hepatobiliary: No focal hepatic lesion.  No IV contrast Pancreas: Pancreas is normal. No ductal dilatation. No pancreatic inflammation. Spleen: Spleen is intact.  No IV contrast Adrenals/urinary tract: Adrenal glands normal. Kidneys normal. Bladder intact Stomach/Bowel: Stomach, small bowel, appendix, and cecum are normal. The colon and rectosigmoid colon are  normal. Vascular/Lymphatic: Abdominal aorta is normal caliber with atherosclerotic calcification. There is no retroperitoneal or periportal lymphadenopathy. No pelvic lymphadenopathy. Reproductive: Post hysterectomy Other: No free fluid in the mesentery. Musculoskeletal: No pelvic fracture. No spine fracture. Chronic compression fracture at T11. IMPRESSION: 1. Small volume anterior LEFT pneumothorax along the upper lobe and lingula lobe related to posterior LEFT rib fractures. 2. Displaced posterolateral LEFT rib fractures (seventh, eighth and ninth) 3. Anterior LEFT sixth and seventh rib fractures. 4. Gas in the LEFT chest wall musculature related to the rib fractures. 5. No evidence of blunt trauma in the abdomen pelvis. 6. No evidence of spine fracture. Findings conveyed toMARCY PFEIFFER on 06/30/2019  at20:29. Electronically Signed   By: Suzy Bouchard M.D.   On: 06/30/2019 20:34   CT Chest Wo Contrast  Result Date: 06/30/2019 CLINICAL DATA:  Mechanical fall .  Chest pain. EXAM: CT CHEST, ABDOMEN AND PELVIS WITHOUT CONTRAST TECHNIQUE: Multidetector CT imaging of the chest, abdomen and pelvis was performed following the standard protocol without IV contrast. COMPARISON:  None. FINDINGS: CT CHEST FINDINGS Cardiovascular: No mediastinal hematoma. Coronary artery calcification and aortic atherosclerotic calcification. Mediastinum/Nodes: mediastinum is normal. Esophagus and trachea normal. Lungs/Pleura: There is small volume pneumothorax at the medial LEFT apex (image 10/2) as well as anteromedial anterior to the lingula (image 35/2 in 45/2). Pneumothorax associated with posterior LEFT rib fractures. Musculoskeletal: Fracture of the posterior LEFT seventh, eighth and ninth ribs (image 32 and 39 of series 2). Anterior LEFT sixth and seventh rib fractures (image 45/2) There is small amount gas within the chest wall superficial to the ribs and beneath the shoulder musculature (image 47/2.  Small pleural thickening  in the LEFT lower lobe. CT ABDOMEN AND PELVIS FINDINGS Hepatobiliary: No focal hepatic lesion.  No IV contrast Pancreas: Pancreas is normal. No ductal dilatation. No pancreatic inflammation. Spleen: Spleen is intact.  No IV contrast Adrenals/urinary tract: Adrenal glands normal. Kidneys normal. Bladder intact Stomach/Bowel: Stomach, small bowel, appendix, and cecum are normal. The colon and rectosigmoid colon are normal. Vascular/Lymphatic: Abdominal aorta is normal caliber with atherosclerotic calcification. There is no retroperitoneal or periportal lymphadenopathy. No pelvic lymphadenopathy. Reproductive: Post hysterectomy Other: No free fluid in the mesentery. Musculoskeletal: No pelvic fracture. No spine fracture. Chronic compression fracture at T11. IMPRESSION: 1. Small volume anterior LEFT pneumothorax along the upper lobe and lingula lobe related to posterior LEFT rib fractures. 2. Displaced posterolateral LEFT rib fractures (seventh, eighth and ninth) 3. Anterior LEFT sixth and seventh rib fractures. 4. Gas in the LEFT chest wall musculature related to the rib fractures. 5. No evidence of blunt trauma in the abdomen pelvis. 6. No evidence of spine fracture. Findings conveyed toMARCY PFEIFFER on 06/30/2019  at20:29. Electronically Signed   By: Suzy Bouchard M.D.   On: 06/30/2019 20:34    Review of Systems  Constitutional: Negative.   HENT: Negative.   Eyes: Negative.   Respiratory: Negative.   Cardiovascular: Positive for chest pain.  Gastrointestinal: Negative.   Endocrine: Negative.   Genitourinary: Negative.   Musculoskeletal: Negative.   Skin: Negative.   Allergic/Immunologic: Negative.   Neurological: Positive for weakness.  Hematological: Negative.   Psychiatric/Behavioral: Positive for confusion.    Blood pressure (!) 167/67, pulse (!) 56, temperature 98 F (36.7 C), temperature source Oral, resp. rate 17, SpO2 96 %. Physical Exam  Constitutional:  Elderly frail white female  in no acute distress  HENT:  Head: Normocephalic and atraumatic.  Right Ear: External ear normal.  Left Ear: External ear normal.  Mouth/Throat: Oropharynx is clear and moist.  Eyes: Pupils are equal, round, and reactive to light. Conjunctivae and EOM are normal. No scleral icterus.  Cardiovascular: Normal rate, regular rhythm, normal heart sounds and intact distal pulses.  No pitting edema of the lower extremities  Respiratory: Effort normal and breath sounds normal. No respiratory distress.  No use of accessory respiratory muscles  GI: Soft. Bowel sounds are normal. There is no abdominal tenderness.  Musculoskeletal:        General: No tenderness or deformity. Normal range of motion.     Cervical back: Normal range of motion and neck supple.  Lymphadenopathy:    She has no cervical adenopathy.  No palpable groin or cervical lymphadenopathy  Neurological: She is alert.  Not oriented to place.  She is oriented to the recent events  Skin: Skin is warm and dry. No rash noted.  Psychiatric: She has a normal mood and affect. Her behavior is normal. Thought content normal.     Assessment/Plan The patient fell from level ground in the bathroom.  She has multiple left-sided rib fractures with a very tiny pneumothorax.  She is stable hemodynamically and from a respiratory standpoint.  At this point she will need to be admitted to the hospital for observation to make sure her lung injury does not worsen.  We will transfer her to the trauma service and monitor her closely.  Autumn Messing III, MD 06/30/2019, 10:35 PM

## 2019-06-30 NOTE — ED Notes (Signed)
Pt back from CT lying in bed awake. Family at bedside. Ice pack placed to her left back. Will continue to monitor.

## 2019-06-30 NOTE — ED Triage Notes (Signed)
Pt arrives [facility] from home with complaints of a mechanical fall. Pt reports that she tripped over the threshold of the door. Pt reports low back pain and left flank pain. Pt denies head injury or LOC. Pt denies blood thinners. EMS reports bruising to left flank.

## 2019-06-30 NOTE — ED Notes (Signed)
Pt lying in bed. Family at bedside. Denies any  Needs. Will continue to monitor.

## 2019-06-30 NOTE — ED Notes (Signed)
Pt lying in bed awake. Family at bedside. Pt denies any needs. Will continue to monitor.

## 2019-06-30 NOTE — ED Provider Notes (Signed)
[facility] DEPT Provider Note   CSN: [id number] Arrival date & time: 06/30/19  1802     History Chief Complaint  Patient presents with   Lytle Michaels    [name] is a 86 y.o. female.  HPI Patient fell in her bathroom at about 10 AM this morning.  She reports that she has very poor balance and she was getting up and lost her balance.  She thought she was going to land back on the toilet but she ended up falling to the side and somehow hitting the left side of her chest very hard.  Her daughter reports that she was able to get her up although it was difficult and assist her back to lying down.  She reports she thought initially that was not too severe but the pain continued to get much worse over the ensuing hour and at that time they called EMS for transport.  The patient denies she has any headache.  She has not had any confusion.  She does not take any anticoagulants.  No neck pain.  No weakness numbness or tingling to extremities.  She reports she has an area that is swollen and very painful on the left side of the posterior thorax.  She denies she feels short of breath but it hurts a lot to take a deep breath.  No weakness or numbness to the legs.  Patient had taken a fall about 2 weeks earlier and gotten a small laceration to her scalp.  She has not been having any ongoing problems with headaches or confusion.    Past Medical History:  Diagnosis Date   Ankle fracture    Anxiety    Arthritis    Barrett's esophagus    Chronic atrial fibrillation ([place])    a. Pt previously declined DCCV.   Coronary artery disease    a. s/p Cypher DES to LAD in 2006;  b. Last LHC 02/2008:  pLAD 30%, mLAD stent patent, pOM 50%, EF 65%;  c. Lexiscan Myoview 7/14:  Intermediate risk, dist ant and inf-lat ischemia, EF 66% - patient declined cath and elected medical therapy.   GERD (gastroesophageal reflux disease)    Hiatal hernia    Hx of echocardiogram    Echocardiogram 08/17/12: EF 55-60%, MAC, mild MR, mild LAE, mild RVE, mild to moderate RAE, moderate TR, mildly increased pulmonary artery systolic pressures   Hyperlipidemia    Hypertension    Mitral regurgitation    a. Mod by echo 10/2014.   Obesity    Pulmonary hypertension ([place])    a. Mod-severely elevated PA pressures by echo 10/2014.   QT prolongation    a. During 10/2014 admit, QTC 546m.   Tricuspid regurgitation    a. Mod by echo 10/2014.   UTI (lower urinary tract infection) 03/2015    Patient Active Problem List   Diagnosis Date Noted   Lewy body dementia ([place] 05/18/2019   Recurrent UTI--E coli/Pseudomonas 01/18/2018   Orthostasis 01/18/2018   Benign essential HTN    Cerebral edema (HCC)    Hypomagnesemia    Dysphagia, post-stroke    Lethargy    Acute blood loss anemia    Hypoalbuminemia due to protein-calorie malnutrition (HCC)    History of Barrett's esophagus    Urinary retention    Seizure prophylaxis    E. coli UTI    Intraventricular hemorrhage (HCC)    Urinary tract infection without hematuria    Coronary artery disease involving native coronary artery of  native heart without angina pectoris    Pulmonary HTN (HCC)    Leukocytosis    Intracranial hemorrhage ([place]) 12/18/2017   Syncope 04/10/2015   Sepsis secondary to UTI ([place]) 04/10/2015   URI (upper respiratory infection) 04/10/2015   UTI (lower urinary tract infection) 04/10/2015   QT prolongation 10/12/2014   Headache 09/02/2012   Tricuspid valve regurgitation, moderate 08/18/2012   Hypokalemia 08/16/2012   Hyponatremia 08/16/2012   Atrial fibrillation ([place])    Hypertension    Anxiety    Mixed hyperlipidemia 09/11/2009   GERD 08/30/2008   CHEST PAIN 08/30/2008   Coronary atherosclerosis 04/04/2008   Syncope and collapse 04/04/2008    Past Surgical History:  Procedure Laterality Date   ABDOMINAL HYSTERECTOMY     ANKLE SURGERY  2001   BREAST  EXCISIONAL BIOPSY Right 1970   BREAST EXCISIONAL BIOPSY Right    BREAST EXCISIONAL BIOPSY Left    CESAREAN SECTION     CORONARY ANGIOPLASTY     ESOPHAGOGASTRODUODENOSCOPY       OB History   No obstetric history on file.     Family History  Problem Relation Age of Onset   Diabetes Mother    Heart attack Mother    Lung cancer Father    Thyroid disease Sister    Thyroid disease Sister    Hypertension Sister    Hypertension Sister    Hypertension Sister    Hypertension Sister    Hypertension Brother    Hypertension Brother    Breast cancer Other 47   Colon cancer Neg Hx    Colon polyps Neg Hx    Esophageal cancer Neg Hx    Stroke Neg Hx     Social History   Tobacco Use   Smoking status: Former Smoker    Types: Cigarettes   Smokeless tobacco: Never Used  Substance Use Topics   Alcohol use: No    Alcohol/week: 0.0 standard drinks   Drug use: No    Home Medications Prior to Admission medications   Medication Sig Start Date End Date Taking? Authorizing Provider  irbesartan (AVAPRO) 75 MG tablet Take 75 mg by mouth daily.   Yes [provider]  levETIRAcetam (KEPPRA) 500 MG tablet Take 1 tablet (500 mg total) by mouth 2 (two) times daily. Patient taking differently: Take 500 mg by mouth in the morning.  12/23/17  Yes Costella, Vista Mink, PA-C  Melatonin 5 MG TABS Take 5 mg by mouth at bedtime as needed (for sleep/anxiety).    Yes [provider]  metoprolol tartrate (LOPRESSOR) 50 MG tablet Take 1 tablet (50 mg total) by mouth 2 (two) times daily. Patient taking differently: Take 50 mg by mouth daily.  01/18/18  Yes Love, Ivan Anchors, PA-C  nitroGLYCERIN (NITROSTAT) 0.4 MG SL tablet Place 1 tablet (0.4 mg total) under the tongue every 5 (five) minutes as needed for chest pain (3 doses only). Patient taking differently: Place 0.4 mg under the tongue every 5 (five) minutes x 3 doses as needed for chest pain.  01/18/18  Yes Love, Ivan Anchors, PA-C  omeprazole (PRILOSEC OTC) 20 MG tablet Take 20 mg by mouth daily before breakfast.   Yes [provider]  polyethylene glycol (MIRALAX / GLYCOLAX) packet Take 17 g by mouth daily. 01/19/18  Yes Love, Ivan Anchors, PA-C  potassium chloride SA (K-DUR,KLOR-CON) 20 MEQ tablet Take 20 mEq by mouth daily.    Yes [provider]  sertraline (ZOLOFT) 50 MG tablet Take 50 mg  by mouth daily.   Yes [provider]  docusate sodium (COLACE) 100 MG capsule Take 1 capsule (100 mg total) by mouth 2 (two) times daily. Patient not taking: Reported on 05/15/2019 01/18/18   Love, Ivan Anchors, PA-C  donepezil (ARICEPT) 5 MG tablet Take 1 tablet (5 mg total) by mouth at bedtime. Patient not taking: Reported on 06/30/2019 05/18/19 06/17/19  Darliss Cheney, MD  irbesartan (AVAPRO) 150 MG tablet Take 1 tablet (150 mg total) by mouth daily. Patient not taking: Reported on 06/30/2019 01/19/18   Love, Ivan Anchors, PA-C  magnesium oxide (MAG-OX) 400 (241.3 Mg) MG tablet Take 0.5 tablets (200 mg total) by mouth 2 (two) times daily. Patient not taking: Reported on 05/15/2019 01/18/18   Love, Ivan Anchors, PA-C  Menthol-Methyl Salicylate (MUSCLE RUB) 10-15 % CREA Apply 1 application topically every 6 (six) hours. To bilateral knees Patient not taking: Reported on 05/15/2019 01/18/18   Bary Leriche, PA-C  mouth rinse LIQD solution 15 mLs by Mouth Rinse route 2 (two) times daily. Patient not taking: Reported on 05/15/2019 01/18/18   Bary Leriche, PA-C  Multiple Vitamin (MULTIVITAMIN WITH MINERALS) TABS tablet Take 1 tablet by mouth daily. Patient not taking: Reported on 05/15/2019 01/19/18   Love, Ivan Anchors, PA-C  ranitidine (ZANTAC) 150 MG/10ML syrup Take 10 mLs (150 mg total) by mouth 2 (two) times daily. Patient not taking: Reported on 05/15/2019 01/18/18   Love, Ivan Anchors, PA-C  simethicone (MYLICON) 80 MG chewable tablet Chew 1 tablet (80 mg total) by mouth 4 (four) times daily as needed for flatulence. Patient not  taking: Reported on 05/15/2019 01/18/18   Bary Leriche, PA-C  tamsulosin (FLOMAX) 0.4 MG CAPS capsule Take 1 capsule (0.4 mg total) by mouth daily after supper. Patient not taking: Reported on 05/15/2019 01/18/18   Bary Leriche, PA-C    Allergies    Amlodipine, Methyldopa, Shellfish-derived products, Tape, Actonel [risedronate sodium], Bactrim [sulfamethoxazole-trimethoprim], Ciprofloxacin hcl, Gas-x [simethicone], Hydralazine hcl, Lactose intolerance (gi), Lisinopril, Milk-related compounds, Other, Oxycodone hcl, Pneumococcal vaccines, Valsartan, Cozaar [losartan], Digitek [digoxin], Felodipine, Hctz [hydrochlorothiazide], Simvastatin, and Spironolactone  Review of Systems   Review of Systems 10 Systems reviewed and are negative for acute change except as noted in the HPI.  Physical Exam Updated Vital Signs BP (!) 165/59    Pulse (!) 55    Temp 98 F (36.7 C) (Oral)    Resp 20    SpO2 98%   Physical Exam Constitutional:      Comments: Patient is alert and appropriate.  No confusion.  She is answering questions appropriately.  No respiratory distress at rest.  HENT:     Head:     Comments: Patient has a healing small eschar on the vertex of the scalp.  No associated hematoma.  Otherwise normocephalic atraumatic.    Nose: Nose normal.     Mouth/Throat:     Mouth: Mucous membranes are moist.     Pharynx: Oropharynx is clear.  Eyes:     Extraocular Movements: Extraocular movements intact.  Neck:     Comments: No C-spine tenderness to palpation. Cardiovascular:     Pulses: Normal pulses.     Comments: Distant heart sounds.  Occasional ectopy. Pulmonary:     Comments: Breath sounds are very soft on the left.  Patient is splinting and difficulty with deep inspiration.  Right is clear.  Patient has moderately large, soft swelling of the soft tissues on the left posterior thoracic chest wall.  Entire  area is very tender. Abdominal:     Comments: Abdomen is soft.  Some reproducible left  upper quadrant pain.  No guarding.  Musculoskeletal:        General: No deformity. Normal range of motion.     Cervical back: Neck supple.  Skin:    General: Skin is warm and dry.  Neurological:     General: No focal deficit present.     Coordination: Coordination normal.  Psychiatric:        Mood and Affect: Mood normal.     ED Results / Procedures / Treatments   Labs (all labs ordered are listed, but only abnormal results are displayed) Labs Reviewed  COMPREHENSIVE METABOLIC PANEL - Abnormal; Notable for the following components:      Result Value   Glucose, Bld 127 (*)    Calcium 8.8 (*)    All other components within normal limits  CBC WITH DIFFERENTIAL/PLATELET - Abnormal; Notable for the following components:   WBC 15.0 (*)    RBC 3.78 (*)    Hemoglobin 11.8 (*)    Neutro Abs 12.0 (*)    Monocytes Absolute 1.3 (*)    Abs Immature Granulocytes 0.12 (*)    All other components within normal limits  LIPASE, BLOOD  PROTIME-INR  URINALYSIS, ROUTINE W REFLEX MICROSCOPIC  TYPE AND SCREEN    EKG None  Radiology CT Abdomen Pelvis Wo Contrast  Result Date: 06/30/2019 CLINICAL DATA:  Mechanical fall .  Chest pain. EXAM: CT CHEST, ABDOMEN AND PELVIS WITHOUT CONTRAST TECHNIQUE: Multidetector CT imaging of the chest, abdomen and pelvis was performed following the standard protocol without IV contrast. COMPARISON:  None. FINDINGS: CT CHEST FINDINGS Cardiovascular: No mediastinal hematoma. Coronary artery calcification and aortic atherosclerotic calcification. Mediastinum/Nodes: mediastinum is normal. Esophagus and trachea normal. Lungs/Pleura: There is small volume pneumothorax at the medial LEFT apex (image 10/2) as well as anteromedial anterior to the lingula (image 35/2 in 45/2). Pneumothorax associated with posterior LEFT rib fractures. Musculoskeletal: Fracture of the posterior LEFT seventh, eighth and ninth ribs (image 32 and 39 of series 2). Anterior LEFT sixth and seventh  rib fractures (image 45/2) There is small amount gas within the chest wall superficial to the ribs and beneath the shoulder musculature (image 47/2. Small pleural thickening in the LEFT lower lobe. CT ABDOMEN AND PELVIS FINDINGS Hepatobiliary: No focal hepatic lesion.  No IV contrast Pancreas: Pancreas is normal. No ductal dilatation. No pancreatic inflammation. Spleen: Spleen is intact.  No IV contrast Adrenals/urinary tract: Adrenal glands normal. Kidneys normal. Bladder intact Stomach/Bowel: Stomach, small bowel, appendix, and cecum are normal. The colon and rectosigmoid colon are normal. Vascular/Lymphatic: Abdominal aorta is normal caliber with atherosclerotic calcification. There is no retroperitoneal or periportal lymphadenopathy. No pelvic lymphadenopathy. Reproductive: Post hysterectomy Other: No free fluid in the mesentery. Musculoskeletal: No pelvic fracture. No spine fracture. Chronic compression fracture at T11. IMPRESSION: 1. Small volume anterior LEFT pneumothorax along the upper lobe and lingula lobe related to posterior LEFT rib fractures. 2. Displaced posterolateral LEFT rib fractures (seventh, eighth and ninth) 3. Anterior LEFT sixth and seventh rib fractures. 4. Gas in the LEFT chest wall musculature related to the rib fractures. 5. No evidence of blunt trauma in the abdomen pelvis. 6. No evidence of spine fracture. Findings conveyed toMARCY Henleigh Robello on 06/30/2019  at20:29. Electronically Signed   By: Suzy Bouchard M.D.   On: 06/30/2019 20:34   CT Chest Wo Contrast  Result Date: 06/30/2019 CLINICAL DATA:  Mechanical fall .  Chest pain. EXAM: CT CHEST, ABDOMEN AND PELVIS WITHOUT CONTRAST TECHNIQUE: Multidetector CT imaging of the chest, abdomen and pelvis was performed following the standard protocol without IV contrast. COMPARISON:  None. FINDINGS: CT CHEST FINDINGS Cardiovascular: No mediastinal hematoma. Coronary artery calcification and aortic atherosclerotic calcification.  Mediastinum/Nodes: mediastinum is normal. Esophagus and trachea normal. Lungs/Pleura: There is small volume pneumothorax at the medial LEFT apex (image 10/2) as well as anteromedial anterior to the lingula (image 35/2 in 45/2). Pneumothorax associated with posterior LEFT rib fractures. Musculoskeletal: Fracture of the posterior LEFT seventh, eighth and ninth ribs (image 32 and 39 of series 2). Anterior LEFT sixth and seventh rib fractures (image 45/2) There is small amount gas within the chest wall superficial to the ribs and beneath the shoulder musculature (image 47/2. Small pleural thickening in the LEFT lower lobe. CT ABDOMEN AND PELVIS FINDINGS Hepatobiliary: No focal hepatic lesion.  No IV contrast Pancreas: Pancreas is normal. No ductal dilatation. No pancreatic inflammation. Spleen: Spleen is intact.  No IV contrast Adrenals/urinary tract: Adrenal glands normal. Kidneys normal. Bladder intact Stomach/Bowel: Stomach, small bowel, appendix, and cecum are normal. The colon and rectosigmoid colon are normal. Vascular/Lymphatic: Abdominal aorta is normal caliber with atherosclerotic calcification. There is no retroperitoneal or periportal lymphadenopathy. No pelvic lymphadenopathy. Reproductive: Post hysterectomy Other: No free fluid in the mesentery. Musculoskeletal: No pelvic fracture. No spine fracture. Chronic compression fracture at T11. IMPRESSION: 1. Small volume anterior LEFT pneumothorax along the upper lobe and lingula lobe related to posterior LEFT rib fractures. 2. Displaced posterolateral LEFT rib fractures (seventh, eighth and ninth) 3. Anterior LEFT sixth and seventh rib fractures. 4. Gas in the LEFT chest wall musculature related to the rib fractures. 5. No evidence of blunt trauma in the abdomen pelvis. 6. No evidence of spine fracture. Findings conveyed toMARCY Conlan Miceli on 06/30/2019  at20:29. Electronically Signed   By: Suzy Bouchard M.D.   On: 06/30/2019 20:34    Procedures Procedures  (including critical care time) CRITICAL CARE Performed by: Charlesetta Shanks   Total critical care time: 30 minutes  Critical care time was exclusive of separately billable procedures and treating other patients.  Critical care was necessary to treat or prevent imminent or life-threatening deterioration.  Critical care was time spent personally by me on the following activities: development of treatment plan with patient and/or surrogate as well as nursing, discussions with consultants, evaluation of patient's response to treatment, examination of patient, obtaining history from patient or surrogate, ordering and performing treatments and interventions, ordering and review of laboratory studies, ordering and review of radiographic studies, pulse oximetry and re-evaluation of patient's condition. Medications Ordered in ED Medications  acetaminophen (TYLENOL) tablet 1,000 mg (1,000 mg Oral Given 06/30/19 2007)  0.9 %  sodium chloride infusion ( Intravenous New Bag/Given 06/30/19 2113)    ED Course  I have reviewed the triage vital signs and the nursing notes.  Pertinent labs & imaging results that were available during my care of the patient were reviewed by me and considered in my medical decision making (see chart for details).  Clinical Course as of Jun 30 2139  Thu Jun 30, 2019  2126 Consult: Reviewed with Dr. Marlou Starks will evaluate in the emergency department for transfer to [facility].   [MP]    Clinical Course User Index [MP] Charlesetta Shanks, MD   MDM Rules/Calculators/A&P                     Patient presents with a fall  in the bathroom.  Patient struck her chest wall and had significant pain and swelling after the event.  She is not anticoagulated.  No associated headache or confusion to suggest head injury.  CT does show multiple rib fractures and 2 rib fractures with anterior and posterior fractures.  There is small pneumothorax present.  Patient is not hypoxic or exhibiting clinical  signs of respiratory distress.  She does however extensive chest wall injury.  Surgery consulted for admission. Final Clinical Impression(s) / ED Diagnoses Final diagnoses:  Closed fracture of multiple ribs of left side, initial encounter  Traumatic pneumothorax, initial encounter    Rx / DC Orders ED Discharge Orders    None       Charlesetta Shanks, MD 06/30/19 2147

## 2019-07-01 ENCOUNTER — Inpatient Hospital Stay (HOSPITAL_COMMUNITY): Payer: Medicare Other

## 2019-07-01 ENCOUNTER — Encounter (HOSPITAL_COMMUNITY): Payer: Self-pay | Admitting: General Surgery

## 2019-07-01 LAB — ABO/RH: ABO/RH(D): O POS

## 2019-07-01 LAB — CBC
HCT: 31.6 % — ABNORMAL LOW (ref 36.0–46.0)
Hemoglobin: 10.5 g/dL — ABNORMAL LOW (ref 12.0–15.0)
MCH: 30.8 pg (ref 26.0–34.0)
MCHC: 33.2 g/dL (ref 30.0–36.0)
MCV: 92.7 fL (ref 80.0–100.0)
Platelets: 181 10*3/uL (ref 150–400)
RBC: 3.41 MIL/uL — ABNORMAL LOW (ref 3.87–5.11)
RDW: 13.2 % (ref 11.5–15.5)
WBC: 9.6 10*3/uL (ref 4.0–10.5)
nRBC: 0 % (ref 0.0–0.2)

## 2019-07-01 LAB — BASIC METABOLIC PANEL
Anion gap: 7 (ref 5–15)
BUN: 16 mg/dL (ref 8–23)
CO2: 25 mmol/L (ref 22–32)
Calcium: 8.8 mg/dL — ABNORMAL LOW (ref 8.9–10.3)
Chloride: 107 mmol/L (ref 98–111)
Creatinine, Ser: 0.61 mg/dL (ref 0.44–1.00)
GFR calc Af Amer: >60 mL/min (ref >60)
GFR calc non Af Amer: >60 mL/min (ref >60)
Glucose, Bld: 106 mg/dL — ABNORMAL HIGH (ref 70–99)
Potassium: 3.4 mmol/L — ABNORMAL LOW (ref 3.5–5.1)
Sodium: 139 mmol/L (ref 135–145)

## 2019-07-01 LAB — URINALYSIS, ROUTINE W REFLEX MICROSCOPIC
Bilirubin Urine: NEGATIVE
Glucose, UA: NEGATIVE mg/dL
Ketones, ur: NEGATIVE mg/dL
Nitrite: NEGATIVE
Protein, ur: NEGATIVE mg/dL
Specific Gravity, Urine: 1.024 (ref 1.005–1.030)
pH: 5 (ref 5.0–8.0)

## 2019-07-01 LAB — SARS CORONAVIRUS 2 BY RT PCR (HOSPITAL ORDER, PERFORMED IN ~~LOC~~ HOSPITAL LAB): SARS Coronavirus 2: NEGATIVE

## 2019-07-01 LAB — TYPE AND SCREEN
ABO/RH(D): O POS
Antibody Screen: NEGATIVE

## 2019-07-01 MED ORDER — POTASSIUM CHLORIDE CRYS ER 20 MEQ PO TBCR
20.0000 meq | EXTENDED_RELEASE_TABLET | Freq: Every day | ORAL | Status: DC
  Administered 2019-07-01 – 2019-07-03 (×3): 20 meq via ORAL
  Filled 2019-07-01 (×3): qty 1

## 2019-07-01 MED ORDER — PROCHLORPERAZINE MALEATE 10 MG PO TABS
10.0000 mg | ORAL_TABLET | Freq: Four times a day (QID) | ORAL | Status: DC | PRN
  Filled 2019-07-01: qty 1

## 2019-07-01 MED ORDER — PANTOPRAZOLE SODIUM 40 MG PO TBEC
40.0000 mg | DELAYED_RELEASE_TABLET | Freq: Every day | ORAL | Status: DC
  Administered 2019-07-02 – 2019-07-03 (×2): 40 mg via ORAL
  Filled 2019-07-01 (×2): qty 1

## 2019-07-01 MED ORDER — IRBESARTAN 150 MG PO TABS
75.0000 mg | ORAL_TABLET | Freq: Every day | ORAL | Status: DC
  Administered 2019-07-01 – 2019-07-03 (×3): 75 mg via ORAL
  Filled 2019-07-01 (×3): qty 1

## 2019-07-01 MED ORDER — DOCUSATE SODIUM 100 MG PO CAPS
100.0000 mg | ORAL_CAPSULE | Freq: Two times a day (BID) | ORAL | Status: DC
  Administered 2019-07-01 – 2019-07-03 (×3): 100 mg via ORAL
  Filled 2019-07-01 (×3): qty 1

## 2019-07-01 MED ORDER — POLYETHYLENE GLYCOL 3350 17 G PO PACK: 17.0000 g | PACK | Freq: Every day | ORAL | Status: DC

## 2019-07-01 MED ORDER — METOPROLOL TARTRATE 50 MG PO TABS
50.0000 mg | ORAL_TABLET | Freq: Every day | ORAL | Status: DC
  Filled 2019-07-01: qty 2

## 2019-07-01 MED ORDER — MORPHINE SULFATE (PF) 2 MG/ML IV SOLN: 1.0000 mg | INTRAVENOUS | Status: DC | PRN

## 2019-07-01 MED ORDER — NITROGLYCERIN 0.4 MG SL SUBL: 0.4000 mg | SUBLINGUAL_TABLET | SUBLINGUAL | Status: DC | PRN

## 2019-07-01 MED ORDER — OMEPRAZOLE MAGNESIUM 20 MG PO TBEC: 20.0000 mg | DELAYED_RELEASE_TABLET | Freq: Every day | ORAL | Status: DC

## 2019-07-01 MED ORDER — ENOXAPARIN SODIUM 40 MG/0.4ML ~~LOC~~ SOLN
40.0000 mg | SUBCUTANEOUS | Status: DC
  Administered 2019-07-02: 40 mg via SUBCUTANEOUS
  Filled 2019-07-01 (×2): qty 0.4

## 2019-07-01 MED ORDER — DOCUSATE SODIUM 100 MG PO CAPS: 100.0000 mg | ORAL_CAPSULE | Freq: Two times a day (BID) | ORAL | Status: DC

## 2019-07-01 MED ORDER — PROCHLORPERAZINE EDISYLATE 10 MG/2ML IJ SOLN
5.0000 mg | Freq: Four times a day (QID) | INTRAMUSCULAR | Status: DC | PRN
  Filled 2019-07-01: qty 2

## 2019-07-01 MED ORDER — METOPROLOL TARTRATE 5 MG/5ML IV SOLN: 5.0000 mg | Freq: Three times a day (TID) | INTRAVENOUS | Status: DC | PRN

## 2019-07-01 MED ORDER — SODIUM CHLORIDE 0.9 % IV SOLN: INTRAVENOUS | Status: DC

## 2019-07-01 MED ORDER — ACETAMINOPHEN 325 MG PO TABS
650.0000 mg | ORAL_TABLET | ORAL | Status: DC | PRN
  Administered 2019-07-01 – 2019-07-02 (×4): 650 mg via ORAL
  Filled 2019-07-01 (×5): qty 2

## 2019-07-01 MED ORDER — PANTOPRAZOLE SODIUM 40 MG IV SOLR
40.0000 mg | Freq: Every day | INTRAVENOUS | Status: DC
  Administered 2019-07-01: 40 mg via INTRAVENOUS
  Filled 2019-07-01: qty 40

## 2019-07-01 MED ORDER — LIDOCAINE 5 % EX PTCH
1.0000 | MEDICATED_PATCH | CUTANEOUS | Status: DC
  Administered 2019-07-01 – 2019-07-03 (×3): 1 via TRANSDERMAL
  Filled 2019-07-01 (×3): qty 1

## 2019-07-01 NOTE — Discharge Instructions (Signed)
Rib Fracture  A rib fracture is a break or crack in one of the bones of the ribs. The ribs are like a cage that goes around your upper chest. A broken or cracked rib is often painful, but most do not cause other problems. Most rib fractures usually heal on their own in 1-3 months. Follow these instructions at home: Managing pain, stiffness, and swelling  If directed, apply ice to the injured area. ? Put ice in a plastic bag. ? Place a towel between your skin and the bag. ? Leave the ice on for 20 minutes, 2-3 times a day.  Take over-the-counter and prescription medicines only as told by your doctor. Activity  Avoid activities that cause pain to the injured area. Protect your injured area.  Slowly increase activity as told by your doctor. General instructions  Do deep breathing as told by your doctor. You may be told to: ? Take deep breaths many times a day. ? Cough many times a day while hugging a pillow. ? Use a device (incentive spirometer) to do deep breathing many times a day.  Drink enough fluid to keep your pee (urine) clear or pale yellow.  Do not wear a rib belt or binder. These do not allow you to breathe deeply.  Keep all follow-up visits as told by your doctor. This is important. Contact a doctor if:  You have a fever. Get help right away if:  You have trouble breathing.  You are short of breath.  You cannot stop coughing.  You cough up thick or bloody spit (sputum).  You feel sick to your stomach (nauseous), throw up (vomit), or have belly (abdominal) pain.  Your pain gets worse and medicine does not help. Summary  A rib fracture is a break or crack in one of the bones of the ribs.  Apply ice to the injured area and take medicines for pain as told by your doctor.  Take deep breaths and cough many times a day. Hug a pillow every time you cough. This information is not intended to replace advice given to you by your health care provider. Make sure you  discuss any questions you have with your health care provider. Document Revised: 01/16/2017 Document Reviewed: 05/06/2016 Elsevier Patient Education  2020 [employer]  

## 2019-07-01 NOTE — ED Notes (Signed)
Offered patient morphine before [facility] arrives, daughter and patient refused.

## 2019-07-01 NOTE — ED Notes (Signed)
Pt lying in bed awake. NAD noted. Family and pt updated on plan of care. Will continue to monitor.

## 2019-07-01 NOTE — ED Notes (Signed)
Pt lying in bed, eye's closed, chest rising and falling. Family at bedside. Will continue to monitor.

## 2019-07-01 NOTE — Progress Notes (Signed)
Pt refusing any pain medication other than her lidoderm patch and is refusing IVF.

## 2019-07-01 NOTE — ED Notes (Signed)
Pt lying in bed. Family at bedside. Pt updated on plan of care. Will continue to monitor.

## 2019-07-01 NOTE — ED Notes (Signed)
Attempted to call report to 2W at [facility].  Nurse just went on break and will have to call me back.  Will attempt again in 30 minutes.

## 2019-07-01 NOTE — Progress Notes (Signed)
PT Cancellation Note  Patient Details Name: [name] MRN: [id number] DOB: 11/23/1926   Cancelled Treatment:    Reason Eval/Treat Not Completed: Other (comment)(transfering to [place]). Per chart review and speaking with RN, pt transferring to [facility] so will defer evaluation to [facility] once transferred.   Talbot Grumbling PT, DPT 07/01/19, 2:58 PM

## 2019-07-01 NOTE — Progress Notes (Addendum)
Subjective: CC: Left ribcage pain Patient's daughter is at bedside and helps provide history.  She reports that yesterday there is no head trauma or loss of consciousness with the fall.  Patient denies any headache, neck pain, back pain, abdominal pain or extremity pain.  She does complain of some left-sided rib cage pain.  No shortness of breath.  She is currently on room air.  Pain is mild and controlled with Tylenol.  She is tolerating clear liquid diet without any nausea or emesis.  Asking for solid food.  She has not gotten out of bed since admission.  PT/OT written for.  Patient lives at home with her daughter and has been dependent on walker for ambulation.  She notes that she has home health coming out twice a week for PT/OT currently. Daughter notes that she has chronic visual changes of the left eye since 2019. No changes in vision from baseline. Patient is orientated to self, time and situation. Not place (thinks she is in [place] at her daughters house). Daughter reports this is her baseline.   Objective: Vital signs in last 24 hours: Temp:  [98 F (36.7 C)] 98 F (36.7 C) (05/13 1829) Pulse Rate:  [44-104] 52 (05/14 1030) Resp:  [11-25] 11 (05/14 1030) BP: (149-178)/(57-96) 152/66 (05/14 1030) SpO2:  [95 %-100 %] 100 % (05/14 1030)    Intake/Output from previous day: 05/13 0701 - 05/14 0700 In: 608.3 [I.V.:608.3] Out: -  Intake/Output this shift: No intake/output data recorded.  PE: Gen:  Alert, NAD, pleasant Neck: No C-Spine tenderness HEENT: EOMI Card:  Irregular Pulm:  CTA b/l. Rate and effort normal. On room air. Left ribcage tenderness. Pulling 1125 on IS Abd: Soft, NT/ND, +BS Ext:  Moves all extremities passively and actively without pain Psych: A&Ox3  Skin: no rashes noted, warm and dry  Lab Results:  Recent Labs    06/30/19 2027 07/01/19 0450  WBC 15.0* 9.6  HGB 11.8* 10.5*  HCT 36.0 31.6*  PLT 213 181   BMET Recent Labs     06/30/19 2027 07/01/19 0450  NA 141 139  K 3.8 3.4*  CL 104 107  CO2 27 25  GLUCOSE 127* 106*  BUN 21 16  CREATININE 0.77 0.61  CALCIUM 8.8* 8.8*   PT/INR Recent Labs    06/30/19 2027  LABPROT 13.0  INR 1.0   CMP     Component Value Date/Time   NA 139 07/01/2019 0450   NA 138 09/08/2016 1233   K 3.4 (L) 07/01/2019 0450   CL 107 07/01/2019 0450   CO2 25 07/01/2019 0450   GLUCOSE 106 (H) 07/01/2019 0450   BUN 16 07/01/2019 0450   BUN 13 09/08/2016 1233   CREATININE 0.61 07/01/2019 0450   CALCIUM 8.8 (L) 07/01/2019 0450   PROT 6.8 06/30/2019 2027   ALBUMIN 3.9 06/30/2019 2027   AST 21 06/30/2019 2027   ALT 13 06/30/2019 2027   ALKPHOS 64 06/30/2019 2027   BILITOT 0.7 06/30/2019 2027   GFRNONAA >60 07/01/2019 0450   GFRAA >60 07/01/2019 0450   Lipase     Component Value Date/Time   LIPASE 17 06/30/2019 2027       Studies/Results: CT Abdomen Pelvis Wo Contrast  Result Date: 06/30/2019 CLINICAL DATA:  Mechanical fall .  Chest pain. EXAM: CT CHEST, ABDOMEN AND PELVIS WITHOUT CONTRAST TECHNIQUE: Multidetector CT imaging of the chest, abdomen and pelvis was performed following the standard protocol without IV contrast. COMPARISON:  None. FINDINGS: CT CHEST FINDINGS Cardiovascular: No mediastinal hematoma. Coronary artery calcification and aortic atherosclerotic calcification. Mediastinum/Nodes: mediastinum is normal. Esophagus and trachea normal. Lungs/Pleura: There is small volume pneumothorax at the medial LEFT apex (image 10/2) as well as anteromedial anterior to the lingula (image 35/2 in 45/2). Pneumothorax associated with posterior LEFT rib fractures. Musculoskeletal: Fracture of the posterior LEFT seventh, eighth and ninth ribs (image 32 and 39 of series 2). Anterior LEFT sixth and seventh rib fractures (image 45/2) There is small amount gas within the chest wall superficial to the ribs and beneath the shoulder musculature (image 47/2. Small pleural thickening in  the LEFT lower lobe. CT ABDOMEN AND PELVIS FINDINGS Hepatobiliary: No focal hepatic lesion.  No IV contrast Pancreas: Pancreas is normal. No ductal dilatation. No pancreatic inflammation. Spleen: Spleen is intact.  No IV contrast Adrenals/urinary tract: Adrenal glands normal. Kidneys normal. Bladder intact Stomach/Bowel: Stomach, small bowel, appendix, and cecum are normal. The colon and rectosigmoid colon are normal. Vascular/Lymphatic: Abdominal aorta is normal caliber with atherosclerotic calcification. There is no retroperitoneal or periportal lymphadenopathy. No pelvic lymphadenopathy. Reproductive: Post hysterectomy Other: No free fluid in the mesentery. Musculoskeletal: No pelvic fracture. No spine fracture. Chronic compression fracture at T11. IMPRESSION: 1. Small volume anterior LEFT pneumothorax along the upper lobe and lingula lobe related to posterior LEFT rib fractures. 2. Displaced posterolateral LEFT rib fractures (seventh, eighth and ninth) 3. Anterior LEFT sixth and seventh rib fractures. 4. Gas in the LEFT chest wall musculature related to the rib fractures. 5. No evidence of blunt trauma in the abdomen pelvis. 6. No evidence of spine fracture. Findings conveyed toMARCY PFEIFFER on 06/30/2019  at20:29. Electronically Signed   By: Suzy Bouchard M.D.   On: 06/30/2019 20:34   CT Chest Wo Contrast  Result Date: 06/30/2019 CLINICAL DATA:  Mechanical fall .  Chest pain. EXAM: CT CHEST, ABDOMEN AND PELVIS WITHOUT CONTRAST TECHNIQUE: Multidetector CT imaging of the chest, abdomen and pelvis was performed following the standard protocol without IV contrast. COMPARISON:  None. FINDINGS: CT CHEST FINDINGS Cardiovascular: No mediastinal hematoma. Coronary artery calcification and aortic atherosclerotic calcification. Mediastinum/Nodes: mediastinum is normal. Esophagus and trachea normal. Lungs/Pleura: There is small volume pneumothorax at the medial LEFT apex (image 10/2) as well as anteromedial anterior  to the lingula (image 35/2 in 45/2). Pneumothorax associated with posterior LEFT rib fractures. Musculoskeletal: Fracture of the posterior LEFT seventh, eighth and ninth ribs (image 32 and 39 of series 2). Anterior LEFT sixth and seventh rib fractures (image 45/2) There is small amount gas within the chest wall superficial to the ribs and beneath the shoulder musculature (image 47/2. Small pleural thickening in the LEFT lower lobe. CT ABDOMEN AND PELVIS FINDINGS Hepatobiliary: No focal hepatic lesion.  No IV contrast Pancreas: Pancreas is normal. No ductal dilatation. No pancreatic inflammation. Spleen: Spleen is intact.  No IV contrast Adrenals/urinary tract: Adrenal glands normal. Kidneys normal. Bladder intact Stomach/Bowel: Stomach, small bowel, appendix, and cecum are normal. The colon and rectosigmoid colon are normal. Vascular/Lymphatic: Abdominal aorta is normal caliber with atherosclerotic calcification. There is no retroperitoneal or periportal lymphadenopathy. No pelvic lymphadenopathy. Reproductive: Post hysterectomy Other: No free fluid in the mesentery. Musculoskeletal: No pelvic fracture. No spine fracture. Chronic compression fracture at T11. IMPRESSION: 1. Small volume anterior LEFT pneumothorax along the upper lobe and lingula lobe related to posterior LEFT rib fractures. 2. Displaced posterolateral LEFT rib fractures (seventh, eighth and ninth) 3. Anterior LEFT sixth and seventh rib fractures. 4. Gas in the LEFT  chest wall musculature related to the rib fractures. 5. No evidence of blunt trauma in the abdomen pelvis. 6. No evidence of spine fracture. Findings conveyed toMARCY PFEIFFER on 06/30/2019  at20:29. Electronically Signed   By: Suzy Bouchard M.D.   On: 06/30/2019 20:34   DG Chest Port 1 View  Result Date: 07/01/2019 CLINICAL DATA:  Left pneumothorax EXAM: PORTABLE CHEST 1 VIEW COMPARISON:  Chest CT from yesterday. FINDINGS: Trace left apical pneumothorax.  Displaced left rib  fractures. Low volume chest with haziness attributed atelectasis. Prominent heart size but distorted by rotation. IMPRESSION: 1. Trace left apical pneumothorax. 2. Low volume chest with atelectasis. Electronically Signed   By: Monte Fantasia M.D.   On: 07/01/2019 05:37    Anti-infectives: Anti-infectives (From admission, onward)   None       Assessment/Plan GLF Left sided rib fractures with apical PTX - CXR this AM with trace left apical PTX. Stable. On RA. No indication for chest tube at this time. Pulm toilet. PT/OT. Multimodal pain control.  HTN - home meds HLD CAD Hx A. fib - not on oral anticoagulation at home FEN - Adv to soft diet VTE - SCDs, start Lovenox ID - None indicated Plan: Pulm toilet, PT/OT. If mobilizes well, hopefully home with family later today. She already has PT/OT arranged at home.   LOS: 1 day    Jillyn Ledger , [facility] 07/01/2019, 10:35 AM Please see Amion for pager number during day hours 7:00am-4:30pm

## 2019-07-02 MED ORDER — LIDOCAINE 5 % EX PTCH: 1.0000 | MEDICATED_PATCH | CUTANEOUS | 0 refills | Status: DC

## 2019-07-02 MED ORDER — ACETAMINOPHEN 325 MG PO TABS: 650.0000 mg | ORAL_TABLET | ORAL | Status: DC | PRN

## 2019-07-02 MED ORDER — ACETAMINOPHEN 500 MG PO TABS
1000.0000 mg | ORAL_TABLET | Freq: Four times a day (QID) | ORAL | Status: DC
  Administered 2019-07-02 – 2019-07-03 (×5): 1000 mg via ORAL
  Filled 2019-07-02 (×5): qty 2

## 2019-07-02 MED ORDER — METOPROLOL TARTRATE 25 MG PO TABS
25.0000 mg | ORAL_TABLET | Freq: Two times a day (BID) | ORAL | Status: DC
  Administered 2019-07-02 – 2019-07-03 (×2): 25 mg via ORAL
  Filled 2019-07-02 (×2): qty 1

## 2019-07-02 MED ORDER — METHOCARBAMOL 500 MG PO TABS
500.0000 mg | ORAL_TABLET | Freq: Three times a day (TID) | ORAL | Status: DC | PRN
  Administered 2019-07-03: 500 mg via ORAL
  Filled 2019-07-02: qty 1

## 2019-07-02 NOTE — Plan of Care (Signed)

## 2019-07-02 NOTE — TOC Transition Note (Signed)
Transition of Care [facility]) - CM/SW Discharge Note   Patient Details  Name: [name] MRN: [id number] Date of Birth: 03/10/1926  Transition of Care [facility]) CM/SW Contact:  Carles Collet, RN Phone Number: 07/02/2019, 9:19 AM   Clinical Narrative:   Damaris Schooner w patient's daughter. She states they are active w [facility] for [facility] services and would like to continue. She denies need for additional DME in the home.  Notified [place] that patient will DC today and will resume [place] [facility] OT. No other CM needs identified.     Final next level of care: [place] Barriers to Discharge: No Barriers Identified   Patient Goals and CMS Choice Patient states their goals for this hospitalization and ongoing recovery are:: to go home CMS Medicare.gov Compare Post Acute Care list provided to:: Other (Comment Required) Choice offered to / list presented to : Adult Children  Discharge Placement                       Discharge Plan and Services                          HH Arranged: PT, OT [place] Agency: [place] ([facility]) Date [place]: 07/02/19 Time [place]: [phone] Representative spoke with at New Home: [place] ([place]) Interventions     Readmission Risk Interventions No flowsheet data found.

## 2019-07-02 NOTE — Progress Notes (Signed)
[facility] Progress Note     Subjective: Patient reports some soreness in left ribs but on room air, denies SOB. Spoke with daughter who reports that they do not want SNF, they will plan to take her home and continue with [facility] therapies.   Objective: Vital signs in last 24 hours: Temp:  [97.7 F (36.5 C)-98.8 F (37.1 C)] 98.8 F (37.1 C) (05/15 0738) Pulse Rate:  [38-69] 69 (05/15 0738) Resp:  [11-21] 20 (05/15 0738) BP: (110-183)/(53-97) 110/92 (05/15 0738) SpO2:  [95 %-100 %] 95 % (05/15 0738) Weight:  [63 kg] 63 kg (05/14 1640)    Intake/Output from previous day: 05/14 0701 - 05/15 0700 In: 758.7 [P.O.:120; I.V.:638.7] Out: -  Intake/Output this shift: No intake/output data recorded.  PE: Gen:  Alert, NAD, pleasant Neck: No C-Spine tenderness HEENT: EOMI Card:  Irregular Pulm:  CTA b/l. Rate and effort normal. On room air. Left ribcage tenderness. Pulling 750 on IS Abd: Soft, NT/ND, +BS Ext:  Moves all extremities passively and actively without pain Psych: A&Ox3  Skin: no rashes noted, warm and dry   Lab Results:  Recent Labs    06/30/19 2027 07/01/19 0450  WBC 15.0* 9.6  HGB 11.8* 10.5*  HCT 36.0 31.6*  PLT 213 181   BMET Recent Labs    06/30/19 2027 07/01/19 0450  NA 141 139  K 3.8 3.4*  CL 104 107  CO2 27 25  GLUCOSE 127* 106*  BUN 21 16  CREATININE 0.77 0.61  CALCIUM 8.8* 8.8*   PT/INR Recent Labs    06/30/19 2027  LABPROT 13.0  INR 1.0   CMP     Component Value Date/Time   NA 139 07/01/2019 0450   NA 138 09/08/2016 1233   K 3.4 (L) 07/01/2019 0450   CL 107 07/01/2019 0450   CO2 25 07/01/2019 0450   GLUCOSE 106 (H) 07/01/2019 0450   BUN 16 07/01/2019 0450   BUN 13 09/08/2016 1233   CREATININE 0.61 07/01/2019 0450   CALCIUM 8.8 (L) 07/01/2019 0450   PROT 6.8 06/30/2019 2027   ALBUMIN 3.9 06/30/2019 2027   AST 21 06/30/2019 2027   ALT 13 06/30/2019 2027   ALKPHOS 64 06/30/2019 2027   BILITOT 0.7 06/30/2019 2027    GFRNONAA >60 07/01/2019 0450   GFRAA >60 07/01/2019 0450   Lipase     Component Value Date/Time   LIPASE 17 06/30/2019 2027       Studies/Results: CT Abdomen Pelvis Wo Contrast  Result Date: 06/30/2019 CLINICAL DATA:  Mechanical fall .  Chest pain. EXAM: CT CHEST, ABDOMEN AND PELVIS WITHOUT CONTRAST TECHNIQUE: Multidetector CT imaging of the chest, abdomen and pelvis was performed following the standard protocol without IV contrast. COMPARISON:  None. FINDINGS: CT CHEST FINDINGS Cardiovascular: No mediastinal hematoma. Coronary artery calcification and aortic atherosclerotic calcification. Mediastinum/Nodes: mediastinum is normal. Esophagus and trachea normal. Lungs/Pleura: There is small volume pneumothorax at the medial LEFT apex (image 10/2) as well as anteromedial anterior to the lingula (image 35/2 in 45/2). Pneumothorax associated with posterior LEFT rib fractures. Musculoskeletal: Fracture of the posterior LEFT seventh, eighth and ninth ribs (image 32 and 39 of series 2). Anterior LEFT sixth and seventh rib fractures (image 45/2) There is small amount gas within the chest wall superficial to the ribs and beneath the shoulder musculature (image 47/2. Small pleural thickening in the LEFT lower lobe. CT ABDOMEN AND PELVIS FINDINGS Hepatobiliary: No focal hepatic lesion.  No IV contrast Pancreas: Pancreas is normal.  No ductal dilatation. No pancreatic inflammation. Spleen: Spleen is intact.  No IV contrast Adrenals/urinary tract: Adrenal glands normal. Kidneys normal. Bladder intact Stomach/Bowel: Stomach, small bowel, appendix, and cecum are normal. The colon and rectosigmoid colon are normal. Vascular/Lymphatic: Abdominal aorta is normal caliber with atherosclerotic calcification. There is no retroperitoneal or periportal lymphadenopathy. No pelvic lymphadenopathy. Reproductive: Post hysterectomy Other: No free fluid in the mesentery. Musculoskeletal: No pelvic fracture. No spine fracture.  Chronic compression fracture at T11. IMPRESSION: 1. Small volume anterior LEFT pneumothorax along the upper lobe and lingula lobe related to posterior LEFT rib fractures. 2. Displaced posterolateral LEFT rib fractures (seventh, eighth and ninth) 3. Anterior LEFT sixth and seventh rib fractures. 4. Gas in the LEFT chest wall musculature related to the rib fractures. 5. No evidence of blunt trauma in the abdomen pelvis. 6. No evidence of spine fracture. Findings conveyed toMARCY PFEIFFER on 06/30/2019  at20:29. Electronically Signed   By: Suzy Bouchard M.D.   On: 06/30/2019 20:34   CT Chest Wo Contrast  Result Date: 06/30/2019 CLINICAL DATA:  Mechanical fall .  Chest pain. EXAM: CT CHEST, ABDOMEN AND PELVIS WITHOUT CONTRAST TECHNIQUE: Multidetector CT imaging of the chest, abdomen and pelvis was performed following the standard protocol without IV contrast. COMPARISON:  None. FINDINGS: CT CHEST FINDINGS Cardiovascular: No mediastinal hematoma. Coronary artery calcification and aortic atherosclerotic calcification. Mediastinum/Nodes: mediastinum is normal. Esophagus and trachea normal. Lungs/Pleura: There is small volume pneumothorax at the medial LEFT apex (image 10/2) as well as anteromedial anterior to the lingula (image 35/2 in 45/2). Pneumothorax associated with posterior LEFT rib fractures. Musculoskeletal: Fracture of the posterior LEFT seventh, eighth and ninth ribs (image 32 and 39 of series 2). Anterior LEFT sixth and seventh rib fractures (image 45/2) There is small amount gas within the chest wall superficial to the ribs and beneath the shoulder musculature (image 47/2. Small pleural thickening in the LEFT lower lobe. CT ABDOMEN AND PELVIS FINDINGS Hepatobiliary: No focal hepatic lesion.  No IV contrast Pancreas: Pancreas is normal. No ductal dilatation. No pancreatic inflammation. Spleen: Spleen is intact.  No IV contrast Adrenals/urinary tract: Adrenal glands normal. Kidneys normal. Bladder intact  Stomach/Bowel: Stomach, small bowel, appendix, and cecum are normal. The colon and rectosigmoid colon are normal. Vascular/Lymphatic: Abdominal aorta is normal caliber with atherosclerotic calcification. There is no retroperitoneal or periportal lymphadenopathy. No pelvic lymphadenopathy. Reproductive: Post hysterectomy Other: No free fluid in the mesentery. Musculoskeletal: No pelvic fracture. No spine fracture. Chronic compression fracture at T11. IMPRESSION: 1. Small volume anterior LEFT pneumothorax along the upper lobe and lingula lobe related to posterior LEFT rib fractures. 2. Displaced posterolateral LEFT rib fractures (seventh, eighth and ninth) 3. Anterior LEFT sixth and seventh rib fractures. 4. Gas in the LEFT chest wall musculature related to the rib fractures. 5. No evidence of blunt trauma in the abdomen pelvis. 6. No evidence of spine fracture. Findings conveyed toMARCY PFEIFFER on 06/30/2019  at20:29. Electronically Signed   By: Suzy Bouchard M.D.   On: 06/30/2019 20:34   DG Chest Port 1 View  Result Date: 07/01/2019 CLINICAL DATA:  Left pneumothorax EXAM: PORTABLE CHEST 1 VIEW COMPARISON:  Chest CT from yesterday. FINDINGS: Trace left apical pneumothorax.  Displaced left rib fractures. Low volume chest with haziness attributed atelectasis. Prominent heart size but distorted by rotation. IMPRESSION: 1. Trace left apical pneumothorax. 2. Low volume chest with atelectasis. Electronically Signed   By: Monte Fantasia M.D.   On: 07/01/2019 05:37    Anti-infectives: Anti-infectives (From  admission, onward)   None       Assessment/Plan GLF Left sided rib fractures with apical PTX - CXR this AM with trace left apical PTX. Stable. On RA. No indication for chest tube at this time. Pulm toilet. PT/OT. Multimodal pain control.  HTN - home meds HLD CAD Hx A. fib - not on oral anticoagulation at home FEN - Adv to soft diet VTE - SCDs, start Lovenox ID - None indicated Plan: Pulm toilet,  PT/OT. If mobilizes well, hopefully home with family later today. She already has PT/OT arranged at home.  LOS: 2 days    Norm Parcel , [facility] 07/02/2019, 9:03 AM Please see Amion for pager number during day hours 7:00am-4:30pm

## 2019-07-02 NOTE — Evaluation (Addendum)
Physical Therapy Evaluation Patient Details Name: [name] MRN: [id number] DOB: 1926-05-05 Today's Date: 07/02/2019   History of Present Illness  Patient is a 86 y/o female with PMH CAD, A-fib (not on AC,) GERD, previous syncope, ICH, UTI, Alzheimer's dementia admitted with fall in bathroom at home with L side rib fx's and small pneumothorax.  Clinical Impression  Patient presents with mobility limited due to pain, generalized weakness and decreased activity tolerance.  Likely has not been up since admission.  Today able to get to [facility] with significantly increased time, mod A and max cues to persist despite pain.  She would likely do better with a little pain medication first, but I would argue to keep her another day to continue to mobilize rather than send her home today.  PT to follow acutely. Family wants pt home and to resume HHPT.   Vitals while up on BSC HR 81, SpO2 96% on RA, BP 114/53    Follow Up Recommendations Home health PT;Supervision/Assistance - 24 hour    Equipment Recommendations  Wheelchair (measurements PT);Wheelchair cushion (measurements PT)    Recommendations for Other Services       Precautions / Restrictions Precautions Precautions: Fall Restrictions Weight Bearing Restrictions: No      Mobility  Bed Mobility Overal bed mobility: Needs Assistance Bed Mobility: Supine to Sit     Supine to sit: HOB elevated;Min assist;Mod assist     General bed mobility comments: for scooting hips, cues for pt to pull up on rail; assisted some to persist despite pain  Transfers Overall transfer level: Needs assistance Equipment used: Rolling walker (2 wheeled) Transfers: Sit to/from [facility] Sit to Stand: Mod assist;From elevated surface Stand pivot transfers: Min assist       General transfer comment: assist to stand from EOB with some lifting help and cues for hand placement and to persist as pain limiting and needing multiple  attempts,  stand step to [facility] with min A with RW  Ambulation/Gait             General Gait Details: attempted for pt to walk around bed to recliner, but she reported unable due to pain  Stairs            Wheelchair Mobility    Modified Rankin (Stroke Patients Only)       Balance Overall balance assessment: Needs assistance Sitting-balance support: Feet supported Sitting balance-Leahy Scale: Poor Sitting balance - Comments: can sit with S, but when pain hits her she leans over needing assist to maintain upright   Standing balance support: During functional activity;Bilateral upper extremity supported Standing balance-Leahy Scale: Poor Standing balance comment: UE support and A for safety, history of multiple falls                             Pertinent Vitals/Pain Pain Assessment: Faces Faces Pain Scale: Hurts whole lot Pain Location: L side Pain Descriptors / Indicators: Guarding;Grimacing;Moaning Pain Intervention(s): Repositioned;Monitored during session;Limited activity within patient's tolerance    Home Living Family/patient expects to be discharged to:: Private residence Living Arrangements: Children Available Help at Discharge: Family;Available 24 hours/day Type of Home: House Home Access: Stairs to enter;Ramped entrance Entrance Stairs-Rails: Right;Left Entrance Stairs-Number of Steps: 5-6 Home Layout: Two level;Able to live on main level with bedroom/bathroom Home Equipment: Gilford Rile - 2 wheels;Cane - single point;Bedside commode;Shower seat;Other (comment);Wheelchair - manual      Prior Function Level of Independence: Needs  assistance   Gait / Transfers Assistance Needed: wawlks with RW  ADL's / Homemaking Assistance Needed: daughter assist with showering        Hand Dominance   Dominant Hand: Right    Extremity/Trunk Assessment   Upper Extremity Assessment Upper Extremity Assessment: LUE deficits/detail LUE Deficits / Details:  pain when elevating L UE, but generally WFL for self feeding and moving to use for transfers    Lower Extremity Assessment Lower Extremity Assessment: Generalized weakness    Cervical / Trunk Assessment Cervical / Trunk Assessment: Kyphotic  Communication   Communication: HOH  Cognition Arousal/Alertness: Awake/alert Behavior During Therapy: WFL for tasks assessed/performed Overall Cognitive Status: History of cognitive impairments - at baseline                                 General Comments: likely at baseline      General Comments      Exercises     Assessment/Plan    PT Assessment Patient needs continued PT services  PT Problem List Decreased strength;Decreased mobility;Decreased safety awareness;Decreased activity tolerance;Decreased balance;Decreased knowledge of use of DME;Pain       PT Treatment Interventions DME instruction;Therapeutic activities;Gait training;Therapeutic exercise;Balance training;Functional mobility training;Stair training;Patient/family education    PT Goals (Current goals can be found in the Care Plan section)  Acute Rehab PT Goals Patient Stated Goal: improve pain, go home PT Goal Formulation: With patient Time For Goal Achievement: 07/16/19 Potential to Achieve Goals: Fair    Frequency Min 3X/week   Barriers to discharge        Co-evaluation               AM-PAC PT "6 Clicks" Mobility  Outcome Measure Help needed turning from your back to your side while in a flat bed without using bedrails?: A Lot Help needed moving from lying on your back to sitting on the side of a flat bed without using bedrails?: A Lot Help needed moving to and from a bed to a chair (including a wheelchair)?: Total Help needed standing up from a chair using your arms (e.g., wheelchair or bedside chair)?: A Lot Help needed to walk in hospital room?: A Lot Help needed climbing 3-5 steps with a railing? : Total 6 Click Score: 10    End  of Session Equipment Utilized During Treatment: Gait belt Activity Tolerance: Patient limited by pain Patient left: in bed;with call bell/phone within reach;with bed alarm set   PT Visit Diagnosis: Other abnormalities of gait and mobility (R26.89)    Time: [id number] PT Time Calculation (min) (ACUTE ONLY): 40 min   Charges:   PT Evaluation $PT Eval Moderate Complexity: 1 Mod PT Treatments $Therapeutic Activity: 23-37 mins        Magda Kiel, [place] Acute Rehabilitation Services [phone] 07/02/2019   Reginia Naas 07/02/2019, 10:46 AM

## 2019-07-03 MED ORDER — ACETAMINOPHEN 500 MG PO TABS: 1000.0000 mg | ORAL_TABLET | Freq: Four times a day (QID) | ORAL | 0 refills | Status: DC

## 2019-07-03 NOTE — Progress Notes (Signed)
Physical Therapy Treatment Patient Details Name: [name] MRN: [id number] DOB: 08-11-1926 Today's Date: 07/03/2019    History of Present Illness Patient is a 86 y/o female with PMH CAD, A-fib (not on AC,) GERD, previous syncope, ICH, UTI, Alzheimer's dementia admitted with fall in bathroom at home with L side rib fx's and small pneumothorax.    PT Comments    Pt presents today with somewhat improved pain control and is able to demonstrate necessary level of independence with basic tasks in order to return home with daughter's assistance.  See also OT note and below for details of this session. Continue to recommend HHPT at d/c.      Follow Up Recommendations  Home health PT;Supervision/Assistance - 24 hour     Equipment Recommendations  Wheelchair (measurements PT);Wheelchair cushion (measurements PT)    Recommendations for Other Services       Precautions / Restrictions Precautions Precautions: Fall Restrictions Weight Bearing Restrictions: No    Mobility  Bed Mobility Overal bed mobility: Needs Assistance Bed Mobility: Supine to Sit     Supine to sit: Min assist     General bed mobility comments: not observed, see OT notes of this date  Transfers Overall transfer level: Needs assistance Equipment used: Rolling walker (2 wheeled) Transfers: Sit to/from [facility] Sit to Stand: Min assist;+2 safety/equipment Stand pivot transfers: Min assist;+2 safety/equipment       General transfer comment: verbal cues with demonstration for safest hand placement during transition, otherwise pt scoots out independently  Ambulation/Gait Ambulation/Gait assistance: Min assist Gait Distance (Feet): 5 Feet Assistive device: Rolling walker (2 wheeled) Gait Pattern/deviations: Decreased stride length     General Gait Details: minimal tolerance for longer distance due to pain, but pt is able to advance RW for basic tasks   Stairs Stairs: Yes Stairs  assistance: Mod assist Stair Management: Two rails;Alternating pattern;Sideways;Forwards Number of Stairs: 5 General stair comments: pt reports steps to enter, but daughter later confirms ramp; pt slow and deliberate on steps, but becomes anxious, hot, 'feels like I might pass out" but returns to rolling recliner without incident   Wheelchair Mobility    Modified Rankin (Stroke Patients Only)       Balance Overall balance assessment: Needs assistance Sitting-balance support: Feet supported Sitting balance-Leahy Scale: Fair Sitting balance - Comments: demonstrates trunk fatigue unable to maintain unsupported upright >1-2 minutes, but no indication of overt neurological imbalance   Standing balance support: Bilateral upper extremity supported;During functional activity Standing balance-Leahy Scale: Poor Standing balance comment: dependent on external support for standing tasks, lacks confidence without device/with only human assist                            Cognition Arousal/Alertness: Awake/alert Behavior During Therapy: WFL for tasks assessed/performed Overall Cognitive Status: No family/caregiver present to determine baseline cognitive functioning                                 General Comments: Pt with h/o cognitive deficits       Exercises      General Comments General comments (skin integrity, edema, etc.): Pt reports STE front of home, wheeled to stairwell for practice, see STAIRS for details, then later learned from daughter home has a ramp.        Pertinent Vitals/Pain Pain Assessment: Faces Faces Pain Scale: Hurts even more Pain Location: L  side Pain Descriptors / Indicators: Guarding;Grimacing;Moaning Pain Intervention(s): Limited activity within patient's tolerance;Monitored during session;Premedicated before session;Repositioned    Home Living Family/patient expects to be discharged to:: Private residence Living Arrangements:  Children Available Help at Discharge: Family;Available 24 hours/day Type of Home: House Home Access: Stairs to enter;Ramped entrance Entrance Stairs-Rails: Right;Left Home Layout: Two level;Able to live on main level with bedroom/bathroom Home Equipment: Gilford Rile - 2 wheels;Cane - single point;Bedside commode;Shower seat;Other (comment);Wheelchair - manual      Prior Function Level of Independence: Needs assistance  Gait / Transfers Assistance Needed: ambulates with RW.  H/o falls  ADL's / Homemaking Assistance Needed: daughter assists with shower transfers, hair care and IADLs per pt report      PT Goals (current goals can now be found in the care plan section) Acute Rehab PT Goals Patient Stated Goal: to go home  PT Goal Formulation: With patient Time For Goal Achievement: 07/16/19 Potential to Achieve Goals: Fair Progress towards PT goals: Progressing toward goals    Frequency    Min 3X/week      PT Plan Current plan remains appropriate    Co-evaluation PT/OT/SLP Co-Evaluation/Treatment: Yes Reason for Co-Treatment: For patient/therapist safety;To address functional/ADL transfers;Necessary to address cognition/behavior during functional activity PT goals addressed during session: Mobility/safety with mobility;Proper use of DME OT goals addressed during session: ADL's and self-care      AM-PAC PT "6 Clicks" Mobility   Outcome Measure  Help needed turning from your back to your side while in a flat bed without using bedrails?: A Lot Help needed moving from lying on your back to sitting on the side of a flat bed without using bedrails?: A Lot Help needed moving to and from a bed to a chair (including a wheelchair)?: A Lot Help needed standing up from a chair using your arms (e.g., wheelchair or bedside chair)?: A Lot Help needed to walk in hospital room?: A Lot Help needed climbing 3-5 steps with a railing? : A Lot 6 Click Score: 12    End of Session Equipment  Utilized During Treatment: Gait belt Activity Tolerance: Patient limited by pain Patient left: in chair;with call bell/phone within reach;with chair alarm set;with nursing/sitter in room Nurse Communication: Mobility status;Precautions PT Visit Diagnosis: Other abnormalities of gait and mobility (R26.89)     Time: [id number] PT Time Calculation (min) (ACUTE ONLY): 45 min  Charges:  $Gait Training: 8-22 mins $Therapeutic Activity: 8-22 mins                     Kearney Hard, PT, DPT, MS Board Certified Geriatric Clinical Specialist    Herbie Drape 07/03/2019, 12:59 PM

## 2019-07-03 NOTE — Evaluation (Signed)
Occupational Therapy Evaluation Patient Details Name: [name] MRN: [id number] DOB: June 09, 1926 Today's Date: 07/03/2019    History of Present Illness Patient is a 86 y/o female with PMH CAD, A-fib (not on AC,) GERD, previous syncope, ICH, UTI, Alzheimer's dementia admitted with fall in bathroom at home with L side rib fx's and small pneumothorax.   Clinical Impression   Pt admitted with above. She demonstrates the below listed deficits and will benefit from continued OT to maximize safety and independence with BADLs.  Pt presents to OT with impaired cognition (likely baseline), impaired balance, increased pain, decreased activity tolerance.  She currently requires min - max A for ADLs and heavy mod A for functional transfers. She demonstrates heavy posterior and Lt lean.  She lives with her daughter, who has been assisting her with ADLs.   Plan is for pt to discharge home, but need to touch base with daughter to ensure she is able to provide current level of care.       Follow Up Recommendations  Home health OT;Supervision/Assistance - 24 hour    Equipment Recommendations  Wheelchair cushion (measurements OT);Wheelchair (measurements OT);3 in 1 bedside commode    Recommendations for Other Services       Precautions / Restrictions Precautions Precautions: Fall Restrictions Weight Bearing Restrictions: (P) No      Mobility Bed Mobility Overal bed mobility: Needs Assistance Bed Mobility: Supine to Sit     Supine to sit: Min assist     General bed mobility comments: assist to move feet off the bed and to lift trunk    Transfers Overall transfer level: Needs assistance Equipment used: Rolling walker (2 wheeled) Transfers: Sit to/from [facility] Sit to Stand: Mod assist Stand pivot transfers: Mod assist       General transfer comment: Pt with heavy posterior and left lean     Balance Overall balance assessment: Needs assistance Sitting-balance  support: Feet supported Sitting balance-Leahy Scale: Fair Sitting balance - Comments: maintained static sitting with min guard assist.  Looses balance posteriorly with dynamic tasks    Standing balance support: Bilateral upper extremity supported Standing balance-Leahy Scale: Poor Standing balance comment: requires UE support and mod A                            ADL either performed or assessed with clinical judgement   ADL Overall ADL's : Needs assistance/impaired Eating/Feeding: Independent   Grooming: Wash/dry hands;Wash/dry face;Oral care;Brushing hair;Minimal assistance;Sitting Grooming Details (indicate cue type and reason): min A for hair care due to pain  Upper Body Bathing: Minimal assistance;Sitting   Lower Body Bathing: Moderate assistance;Sit to/from stand Lower Body Bathing Details (indicate cue type and reason): Pt requires assist to to access feet  Upper Body Dressing : Minimal assistance;Sitting   Lower Body Dressing: Maximal assistance;Sit to/from stand Lower Body Dressing Details (indicate cue type and reason): difficulty accessing feet due to pain  Toilet Transfer: Stand-pivot;BSC;RW;Moderate assistance Toilet Transfer Details (indicate cue type and reason): Pt initially with difficulty moving sit to stand, but this improved during session  Toileting- Clothing Manipulation and Hygiene: Maximal assistance;Sit to/from stand Toileting - Clothing Manipulation Details (indicate cue type and reason): Pt required max A for hygiene due to pain and difficulty accessing peri area      Functional mobility during ADLs: Moderate assistance;Minimal assistance;+2 for safety/equipment;Rolling walker General ADL Comments: Pt with heavy posterior and Lt lean      Vision  Perception     Praxis      Pertinent Vitals/Pain Pain Assessment: Faces Faces Pain Scale: Hurts even more Pain Location: L side Pain Descriptors / Indicators:  Guarding;Grimacing;Moaning Pain Intervention(s): Repositioned;Limited activity within patient's tolerance     Hand Dominance Right   Extremity/Trunk Assessment Upper Extremity Assessment Upper Extremity Assessment: LUE deficits/detail LUE Deficits / Details: mild residual weakness and pain due to rib fxs    Lower Extremity Assessment Lower Extremity Assessment: Defer to PT evaluation   Cervical / Trunk Assessment Cervical / Trunk Assessment: Kyphotic   Communication Communication Communication: HOH   Cognition Arousal/Alertness: Awake/alert Behavior During Therapy: WFL for tasks assessed/performed Overall Cognitive Status: No family/caregiver present to determine baseline cognitive functioning                                 General Comments: Pt with h/o cognitive deficits    General Comments       Exercises     Shoulder Instructions      Home Living Family/patient expects to be discharged to:: Private residence Living Arrangements: Children Available Help at Discharge: Family;Available 24 hours/day Type of Home: House Home Access: Stairs to enter;Ramped entrance Entrance Stairs-Number of Steps: 5-6 Entrance Stairs-Rails: Right;Left Home Layout: Two level;Able to live on main level with bedroom/bathroom Alternate Level Stairs-Number of Steps: has chair lift Alternate Level Stairs-Rails: Right Bathroom Shower/Tub: Walk-in shower   Bathroom Toilet: Handicapped height     Home Equipment: Environmental consultant - 2 wheels;Cane - single point;Bedside commode;Shower seat;Other (comment);Wheelchair - manual          Prior Functioning/Environment Level of Independence: Needs assistance  Gait / Transfers Assistance Needed: ambulates with RW.  H/o falls  ADL's / Homemaking Assistance Needed: daughter assists with shower transfers, hair care and IADLs per pt report             OT Problem List: Decreased strength;Impaired balance (sitting and/or standing);Decreased  activity tolerance;Decreased safety awareness;Decreased knowledge of use of DME or AE;Pain      OT Treatment/Interventions: Self-care/ADL training;Neuromuscular education;DME and/or AE instruction;Manual therapy;Therapeutic activities;Cognitive remediation/compensation;Patient/family education;Balance training    OT Goals(Current goals can be found in the care plan section) Acute Rehab OT Goals Patient Stated Goal: to go home  OT Goal Formulation: With patient Time For Goal Achievement: 07/17/19 Potential to Achieve Goals: Good ADL Goals Pt Will Perform Grooming: with min assist;standing Pt Will Transfer to Toilet: with min assist;ambulating;regular height toilet;bedside commode;grab bars Pt Will Perform Toileting - Clothing Manipulation and hygiene: with min assist;sit to/from stand Additional ADL Goal #1: daughter will safely be able to assist pt with ADLs  OT Frequency: Min 2X/week   Barriers to D/C:            Co-evaluation              AM-PAC OT "6 Clicks" Daily Activity     Outcome Measure Help from another person eating meals?: None Help from another person taking care of personal grooming?: A Little Help from another person toileting, which includes using toliet, bedpan, or urinal?: A Lot Help from another person bathing (including washing, rinsing, drying)?: A Lot Help from another person to put on and taking off regular upper body clothing?: A Lot Help from another person to put on and taking off regular lower body clothing?: A Lot 6 Click Score: 15   End of Session Equipment Utilized During Treatment: Gait belt;Rolling walker Nurse  Communication: Mobility status  Activity Tolerance: Patient limited by pain Patient left: in chair;with call bell/phone within reach;with chair alarm set  OT Visit Diagnosis: Unsteadiness on feet (R26.81);Pain;Cognitive communication deficit (R41.841)                [id number] OT Time Calculation (min): 22 min Charges:  OT  General Charges $OT Visit: 1 Visit OT Evaluation $OT Eval Moderate Complexity: 1 Mod  Nilsa Nutting., OTR/L Acute Rehabilitation Services Pager [phone] Office [phone]   Lucille Passy M 07/03/2019, 11:17 AM

## 2019-07-03 NOTE — Progress Notes (Signed)
Occupational Therapy Progress Note  Pt seen in conjunction with PT based on performance earlier.  She moved much better on second visit and was able to perform toilet transfers with min A, and second person for safety.  She requires mod - max A for LB ADLs and peri care during toileting.  Spoke with daughter who reports she feels comfortable with pt's current level of care needs.   They have w/c and BSC.   Recommend HHOT.     07/03/19 1100  OT Visit Information  Last OT Received On 07/03/19  Assistance Needed +2 (safety )  PT/OT/SLP Co-Evaluation/Treatment Yes  Reason for Co-Treatment For patient/therapist safety;Necessary to address cognition/behavior during functional activity;To address functional/ADL transfers  OT goals addressed during session ADL's and self-care  History of Present Illness Patient is a 86 y/o female with PMH CAD, A-fib (not on AC,) GERD, previous syncope, ICH, UTI, Alzheimer's dementia admitted with fall in bathroom at home with L side rib fx's and small pneumothorax.  Precautions  Precautions Fall  Pain Assessment  Pain Assessment Faces  Faces Pain Scale 6  Pain Location L side  Pain Descriptors / Indicators Guarding;Grimacing;Moaning  Pain Intervention(s) Repositioned  Cognition  Arousal/Alertness Awake/alert  Behavior During Therapy WFL for tasks assessed/performed  Overall Cognitive Status No family/caregiver present to determine baseline cognitive functioning  General Comments Pt with h/o cognitive deficits   Upper Extremity Assessment  Upper Extremity Assessment LUE deficits/detail  LUE Deficits / Details mild residual weakness and pain due to rib fxs   Lower Extremity Assessment  Lower Extremity Assessment Defer to PT evaluation  ADL  Overall ADL's  Needs assistance/impaired  Toilet Transfer Stand-pivot;BSC;RW;Minimal assistance;+2 for safety/equipment  Toileting- Clothing Manipulation and Hygiene Maximal assistance;Sit to/from stand  Toileting -  Clothing Manipulation Details (indicate cue type and reason) Pt required max A for hygiene due to pain and difficulty accessing peri area   Functional mobility during ADLs Minimal assistance;+2 for safety/equipment;Rolling walker  General ADL Comments Pt with heavy posterior and Lt lean   Bed Mobility  Bed Mobility Supine to Sit  Balance  Overall balance assessment Needs assistance  Sitting-balance support Feet supported  Sitting balance-Leahy Scale Fair  Sitting balance - Comments maintains static sitting the min guard assist   Standing balance support Bilateral upper extremity supported  Standing balance-Leahy Scale Poor  Standing balance comment requires min A for balance and bil. UE support   Transfers  Overall transfer level Needs assistance  Equipment used Rolling walker (2 wheeled)  Transfers Sit to/from [employer] Transfers  Sit to [employer] safety/equipment  Stand pivot transfers Min assist;+2 safety/equipment  General transfer comment pt requires min - mod verbal cues for hand placement   General Comments  General comments (skin integrity, edema, etc.) practiced stairs with pt - see PT note.  Spoke with daughter to provide update on pt status and performance.  She reports she feels comfortable with pt's current level of care. She reports that they have a ramp at home as well as a w/c and all needed DME.  daughter inquiring about a lift chair   OT - End of Session  Equipment Utilized During Treatment Gait belt;Rolling walker  Activity Tolerance Patient tolerated treatment well  Patient left in chair;with call bell/phone within reach;with chair alarm set  Nurse Communication Mobility status  OT Assessment/Plan  OT Plan Discharge plan remains appropriate  OT Visit Diagnosis Unsteadiness on feet (R26.81);Pain;Cognitive communication deficit (R41.841)  OT Frequency (ACUTE ONLY) Min 2X/week  Follow Up Recommendations Home health OT;Supervision/Assistance - 24 hour   OT Equipment Wheelchair cushion (measurements OT);Wheelchair (measurements OT);3 in 1 bedside commode  AM-PAC OT "6 Clicks" Daily Activity Outcome Measure (Version 2)  Help from another person eating meals? 4  Help from another person taking care of personal grooming? 3  Help from another person toileting, which includes using toliet, bedpan, or urinal? 2  Help from another person bathing (including washing, rinsing, drying)? 2  Help from another person to put on and taking off regular upper body clothing? 2  Help from another person to put on and taking off regular lower body clothing? 2  6 Click Score 15  Acute Rehab OT Goals  Patient Stated Goal to go home   OT Goal Formulation With patient  Time For Goal Achievement 07/17/19  Potential to Achieve Goals Good  OT Time Calculation  OT Start Time (ACUTE ONLY) 0937  OT Stop Time (ACUTE ONLY) 1014  OT Time Calculation (min) 37 min  OT General Charges  $OT Visit 1 Visit  OT Treatments  $Self Care/Home Management  8-22 mins  Nilsa Nutting., OTR/L Acute Rehabilitation Services Pager [phone] Office [phone]

## 2019-07-03 NOTE — Discharge Summary (Signed)
Physician Discharge Summary  Patient ID: [name] MRN: [id number] DOB/AGE: Jul 04, 1926 86 y.o.  Admit date: 06/30/2019 Discharge date: 07/03/2019  Discharge Diagnoses Ground level fall Left sided rib fractures with apical pneumothorax  Consultants None  Procedures None  HPI: The patient is a 86 year old white female who fell this evening in the bathroom and struck her left side on the tub and toilet.  She initially did have some left-sided chest pain but this seems to have resolved with a couple Tylenol.  She denies any shortness of breath.  She has had several falls in the last few months.  She lives at home with her daughter.  She does have some underlying dementia and atrial fibrillation. She is not on blood thinners. Workup revealed left sided rib fractures with apical pneumothorax.  Hospital Course: Patient was admitted to the trauma service. Follow up CXR was stable. Pain was well controlled on tylenol and lidocaine patches. Patient and daughter both stated that they did not want patient to go to SNF on discharge. Patient worked with therapies who recommended 24 hr supervision with [facility] therapies. Patient already enrolled in home health and should be able to continue with current agency. Patient and daughter have needed equipment at home already. On 07/03/19 patient felt stable for discharge home and should follow up with PCP in 1-2 weeks.   PE: Gen: Alert, NAD, pleasant Neck: No C-Spine tenderness HEENT:EOMI Card:Irregular Pulm: CTA b/l. Rate and effort normal. On room air. Left ribcage tenderness. Pulling 750 on IS Abd: Soft, NT/ND, +BS [id number] all extremities passively and actively without pain Psych: A&Ox3  Skin: no rashes noted, warm and dry  Allergies as of 07/03/2019      Reactions   Amlodipine Shortness Of Breath, Swelling, Other (See Comments)   Edema in legs   Methyldopa Shortness Of Breath, Swelling   Shellfish-derived Products Other (See Comments)    Syncopal episode   Tape Itching, Other (See Comments)   Also TEARS THE SKIN, SO PLEASE USE AN ALTERNATIVE!!   Actonel [risedronate Sodium] Other (See Comments)   Caused a lump to come up in throat and caused heartburn   Bactrim [sulfamethoxazole-trimethoprim] Itching   Ciprofloxacin Hcl Other (See Comments)   Confusion   Gas-x [simethicone] Itching   Hydralazine Hcl Other (See Comments)   Unknown reaction, per patient   Lactose Intolerance (gi) Diarrhea   Lisinopril Cough   Milk-related Compounds Diarrhea   Other Other (See Comments)   Orange juice : itching   Oxycodone Hcl Other (See Comments)   Hallucinations   Pneumococcal Vaccines Swelling, Other (See Comments)   Arm swells   Valsartan Other (See Comments)   Unknown reaction, per pt   Cozaar [losartan] Rash   Digitek [digoxin] Rash   Felodipine Rash   Hctz [hydrochlorothiazide] Itching, Rash   Pt reports causing "itching under the skin"   Simvastatin Rash   Spironolactone Itching, Rash      Medication List    TAKE these medications   acetaminophen 500 MG tablet Commonly known as: TYLENOL Take 2 tablets (1,000 mg total) by mouth every 6 (six) hours.   docusate sodium 100 MG capsule Commonly known as: COLACE Take 1 capsule (100 mg total) by mouth 2 (two) times daily.   donepezil 5 MG tablet Commonly known as: Aricept Take 1 tablet (5 mg total) by mouth at bedtime.   irbesartan 75 MG tablet Commonly known as: AVAPRO Take 75 mg by mouth daily. What changed: Another medication with the same  name was removed. Continue taking this medication, and follow the directions you see here.   levETIRAcetam 500 MG tablet Commonly known as: Keppra Take 1 tablet (500 mg total) by mouth 2 (two) times daily. What changed: when to take this   lidocaine 5 % Commonly known as: LIDODERM Place 1 patch onto the skin daily. Remove & Discard patch within 12 hours or as directed by MD   magnesium oxide 400 (241.3 Mg) MG  tablet Commonly known as: MAG-OX Take 0.5 tablets (200 mg total) by mouth 2 (two) times daily.   melatonin 5 MG Tabs Take 5 mg by mouth at bedtime as needed (for sleep/anxiety).   metoprolol tartrate 50 MG tablet Commonly known as: LOPRESSOR Take 1 tablet (50 mg total) by mouth 2 (two) times daily. What changed: when to take this   mouth rinse Liqd solution 15 mLs by Mouth Rinse route 2 (two) times daily.   multivitamin with minerals Tabs tablet Take 1 tablet by mouth daily.   Muscle Rub 10-15 % Crea Apply 1 application topically every 6 (six) hours. To bilateral knees   nitroGLYCERIN 0.4 MG SL tablet Commonly known as: NITROSTAT Place 1 tablet (0.4 mg total) under the tongue every 5 (five) minutes as needed for chest pain (3 doses only). What changed:   when to take this  reasons to take this   omeprazole 20 MG tablet Commonly known as: PRILOSEC OTC Take 20 mg by mouth daily before breakfast.   polyethylene glycol 17 g packet Commonly known as: MIRALAX / GLYCOLAX Take 17 g by mouth daily.   potassium chloride SA 20 MEQ tablet Commonly known as: KLOR-CON Take 20 mEq by mouth daily.   ranitidine 150 MG/10ML syrup Commonly known as: ZANTAC Take 10 mLs (150 mg total) by mouth 2 (two) times daily.   sertraline 50 MG tablet Commonly known as: ZOLOFT Take 50 mg by mouth daily.   simethicone 80 MG chewable tablet Commonly known as: MYLICON Chew 1 tablet (80 mg total) by mouth 4 (four) times daily as needed for flatulence.   tamsulosin 0.4 MG Caps capsule Commonly known as: FLOMAX Take 1 capsule (0.4 mg total) by mouth daily after supper.        Follow-up Information    Merrilee Seashore, MD. Call.   Specialty: Internal Medicine Why: Call and follow up regarding pain control for rib fractures in 1 week  Contact information: [place] [address] [place], [place] Follow up.    Contact information: [place] [address] [phone]           Signed: Norm Parcel , [facility] 07/03/2019, 9:23 AM Please see Amion for pager number during day hours 7:00am-4:30pm

## 2019-07-05 DIAGNOSIS — I10 Essential (primary) hypertension: Secondary | ICD-10-CM | POA: Diagnosis not present

## 2019-07-05 DIAGNOSIS — G3183 Dementia with Lewy bodies: Secondary | ICD-10-CM | POA: Diagnosis not present

## 2019-07-05 DIAGNOSIS — I2510 Atherosclerotic heart disease of native coronary artery without angina pectoris: Secondary | ICD-10-CM | POA: Diagnosis not present

## 2019-07-05 DIAGNOSIS — F0281 Dementia in other diseases classified elsewhere with behavioral disturbance: Secondary | ICD-10-CM | POA: Diagnosis not present

## 2019-07-05 DIAGNOSIS — I4891 Unspecified atrial fibrillation: Secondary | ICD-10-CM | POA: Diagnosis not present

## 2019-07-05 DIAGNOSIS — M150 Primary generalized (osteo)arthritis: Secondary | ICD-10-CM | POA: Diagnosis not present

## 2019-07-06 ENCOUNTER — Other Ambulatory Visit: Payer: Self-pay

## 2019-07-06 ENCOUNTER — Ambulatory Visit (INDEPENDENT_AMBULATORY_CARE_PROVIDER_SITE_OTHER): Payer: Medicare Other | Admitting: Neurology

## 2019-07-06 ENCOUNTER — Encounter: Payer: Self-pay | Admitting: Neurology

## 2019-07-06 VITALS — BP 130/86 | HR 75 | Resp 20 | Ht 64.0 in | Wt 137.0 lb

## 2019-07-06 DIAGNOSIS — I2510 Atherosclerotic heart disease of native coronary artery without angina pectoris: Secondary | ICD-10-CM | POA: Diagnosis not present

## 2019-07-06 DIAGNOSIS — G3183 Dementia with Lewy bodies: Secondary | ICD-10-CM | POA: Diagnosis not present

## 2019-07-06 DIAGNOSIS — I10 Essential (primary) hypertension: Secondary | ICD-10-CM | POA: Diagnosis not present

## 2019-07-06 DIAGNOSIS — F0281 Dementia in other diseases classified elsewhere with behavioral disturbance: Secondary | ICD-10-CM | POA: Diagnosis not present

## 2019-07-06 DIAGNOSIS — F0280 Dementia in other diseases classified elsewhere without behavioral disturbance: Secondary | ICD-10-CM | POA: Diagnosis not present

## 2019-07-06 NOTE — Patient Instructions (Addendum)
Good to meet you!  1. Continue all your medications  2. Follow-up in 6 months, call for any changes  Seizure Precautions: 1. If medication has been prescribed for you to prevent seizures, take it exactly as directed.  Do not stop taking the medicine without talking to your doctor first, even if you have not had a seizure in a long time.   2. Avoid activities in which a seizure would cause danger to yourself or to others.  Don't operate dangerous machinery, swim alone, or climb in high or dangerous places, such as on ladders, roofs, or girders.  Do not drive unless your doctor says you may.  3. If you have any warning that you may have a seizure, lay down in a safe place where you can't hurt yourself.    4.  No driving for 6 months from last seizure, as per [facility].   Please refer to the following link on the [employer] website for more information: http://[email or web address]/[email or web address]/drivingu.cfm   5.  Maintain good sleep hygiene.  6.  Contact your doctor if you have any problems that may be related to the medicine you are taking.  7.  Call 911 and bring the patient back to the ED if:        A.  The seizure lasts longer than 5 minutes.       B.  The patient doesn't awaken shortly after the seizure  C.  The patient has new problems such as difficulty seeing, speaking or moving  D.  The patient was injured during the seizure  E.  The patient has a temperature over 102 F (39C)  F.  The patient vomited and now is having trouble breathing

## 2019-07-06 NOTE — Progress Notes (Signed)
NEUROLOGY CONSULTATION NOTE  [name] MRN: [id number] DOB: 01-13-1927  Referring provider: Dr. Darliss Cheney Primary care provider: Dr. Merrilee Seashore  Reason for consult:  Syncope, dementia  Dear Dr Doristine Bosworth:  Thank you for your kind referral of [name] for consultation of the above symptoms. Although her history is well known to you, please allow me to reiterate it for the purpose of our medical record. The patient was accompanied to the clinic by her daughter who also provides collateral information. Records and images were personally reviewed where available.   HISTORY OF PRESENT ILLNESS: This is a 86 year old right-handed woman with a history of mild dementia, atrial fibrillation, pulmonary hypertension, intraventricular hemorrhage in 2019 felt due to amyloid angiopathy versus hypertension, presenting for evaluation of syncope and dementia. Her daughter is present during the visit to provide additional information. Her daughter reports that prior to the IVH in 2019, she was quite independent, doing everything herself. She has been living with her daughter since then. She did very well with post-stroke rehab, with mild left-sided weakness. She was admitted to [facility] in March 2021 after an episode of loss of consciousness that lasted 30 minutes. Cardiology did not feel etiology was cardiogenic. She was evaluated by Neurology, notes reviewed. She was about to take a bath when she looked at her daughter and told her she thought she was going to faint, feeling hot. She then had a blank stare and was unresponsive, slumping slowly to the right. She appeared to be sleeping and was not arousable to sternal rub. Her daughter denied any convulsive activity. In the ER she started talking but was even more confused and very agitated. On further questioning, her daughter reported formed visual hallucinations, waxing and waning mentation, and delusions that a copy of her daughter is at  home ("bad version" of her daughter). Overall symptoms in context of cognitive decline felt suggestive of Lewey body dementia. Her MRI brain did not show any acute changes, there was encephalomalacia and gliosis in the anterior right temporal lobe with associated right hippocampus volume loss and superficial siderosis, extensive chronic microvascular disease, significantly progressed since 2019, diffuse cerebral volume loss, particularly in the bilateral parietal regions. EEG showed intermittent generalized 3-5 Hz theta-delta slowing. It appears she was started on Keppra after the IVH and was continued during her hospital stay, her daughter has been giving Kepppra 544m daily, no side effects.   Her daughter reports that for over 10 years, she has had "tiny spells." One time they were sitting in a restaurant, she was getting into her purse then stopped and asked if they could sit a minute. She was fiddling with her hands then her head went down, she came to when EMS arrived. No jerking/shaking. These had quieted down for a long time until she had the episode in March 2021. Her daughter feels she was not having these episodes when she had the IVH in 2019. She reports episodes of a hot flash with right hand flexion/weakness. After her hospitalization in March 2021, she had another episode coming out of the bathroom where her right hand was flexed on top of her walker, she felt hot and slowly went down. They got her to lay down, she did not pass out but her eyes were open and she was not coherent enough to say anything. Episode lasted 5-10 minutes. She had another episode 1-84 years ago where she again felt hot and was not holding things with her right hand  like she usually would. Another time last month, her right hand seemed weaker when she used the bathroom, it appeared flexed and she was not holding the walker like she usually dose. PT has been helping, she still leans to the left and her left side of the mouth  has been different since the stroke. She has occasional left hand numbness. No neck pain. She was in the ER after a fall last 06/30/19, sustaining multiple rib fractures, small pneumothorax. She continues to report rib pain that affects her sleep. She was previously using a walker, but since the fall uses a wheelchair/walker for transfers. They had noticed her balance has been worsening with several falls recently. She feels lightheaded when active. She needs assistance with dressing and bathing. She is able to feed herself. She wears pull ups for incontinence and has only had 1 accident since coming home. She is usually able to alert them for bathroom needs. She denies any headaches. She was started on Donepezil in March but they feel it made hallucinations worse. Sometimes she says she sees things, one day she was a road sign while sitting on the porch, another time a cardinal was outside the window. When she was on Donepezil, she reported seeing a neighbor walking outside, a big spider on the wall and turtle on the ceiling. These stopped off Donepezil.    PAST MEDICAL HISTORY: Past Medical History:  Diagnosis Date  . Ankle fracture   . Anxiety   . Arthritis   . Barrett's esophagus   . Chronic atrial fibrillation ([place])    a. Pt previously declined DCCV.  Marland Kitchen Coronary artery disease    a. s/p Cypher DES to LAD in 2006;  b. Last LHC 02/2008:  pLAD 30%, mLAD stent patent, pOM 50%, EF 65%;  c. Lexiscan Myoview 7/14:  Intermediate risk, dist ant and inf-lat ischemia, EF 66% - patient declined cath and elected medical therapy.  Marland Kitchen GERD (gastroesophageal reflux disease)   . Hiatal hernia   . Hx of echocardiogram    Echocardiogram 08/17/12: EF 55-60%, MAC, mild MR, mild LAE, mild RVE, mild to moderate RAE, moderate TR, mildly increased pulmonary artery systolic pressures  . Hyperlipidemia   . Hypertension   . Mitral regurgitation    a. Mod by echo 10/2014.  . Obesity   . Pulmonary hypertension ([place])    a.  Mod-severely elevated PA pressures by echo 10/2014.  Marland Kitchen QT prolongation    a. During 10/2014 admit, QTC 547m.  . Tricuspid regurgitation    a. Mod by echo 10/2014.  .Marland KitchenUTI (lower urinary tract infection) 03/2015    PAST SURGICAL HISTORY: Past Surgical History:  Procedure Laterality Date  . ABDOMINAL HYSTERECTOMY    . ANKLE SURGERY  2001  . BREAST EXCISIONAL BIOPSY Right 1970  . BREAST EXCISIONAL BIOPSY Right   . BREAST EXCISIONAL BIOPSY Left   . CESAREAN SECTION    . CORONARY ANGIOPLASTY    . ESOPHAGOGASTRODUODENOSCOPY      MEDICATIONS: Current Outpatient Medications on File Prior to Visit  Medication Sig Dispense Refill  . acetaminophen (TYLENOL) 500 MG tablet Take 2 tablets (1,000 mg total) by mouth every 6 (six) hours. 30 tablet 0  . docusate sodium (COLACE) 100 MG capsule Take 1 capsule (100 mg total) by mouth 2 (two) times daily. 10 capsule 0  . irbesartan (AVAPRO) 75 MG tablet Take 75 mg by mouth daily.    .Marland KitchenlevETIRAcetam (KEPPRA) 500 MG tablet Take 1 tablet (500 mg total)  by mouth 2 (two) times daily. (Patient taking differently: Take 500 mg by mouth in the morning. ) 4 tablet 0  . lidocaine (LIDODERM) 5 % Place 1 patch onto the skin daily. Remove & Discard patch within 12 hours or as directed by MD 30 patch 0  . magnesium oxide (MAG-OX) 400 (241.3 Mg) MG tablet Take 0.5 tablets (200 mg total) by mouth 2 (two) times daily.    . Melatonin 5 MG TABS Take 5 mg by mouth at bedtime as needed (for sleep/anxiety).     . Menthol-Methyl Salicylate (MUSCLE RUB) 10-15 % CREA Apply 1 application topically every 6 (six) hours. To bilateral knees  0  . metoprolol tartrate (LOPRESSOR) 50 MG tablet Take 1 tablet (50 mg total) by mouth 2 (two) times daily. (Patient taking differently: Take 50 mg by mouth daily. )    . mouth rinse LIQD solution 15 mLs by Mouth Rinse route 2 (two) times daily.  0  . Multiple Vitamin (MULTIVITAMIN WITH MINERALS) TABS tablet Take 1 tablet by mouth daily.    .  nitroGLYCERIN (NITROSTAT) 0.4 MG SL tablet Place 1 tablet (0.4 mg total) under the tongue every 5 (five) minutes as needed for chest pain (3 doses only). (Patient taking differently: Place 0.4 mg under the tongue every 5 (five) minutes x 3 doses as needed for chest pain. )  12  . omeprazole (PRILOSEC OTC) 20 MG tablet Take 20 mg by mouth daily before breakfast.    . polyethylene glycol (MIRALAX / GLYCOLAX) packet Take 17 g by mouth daily. 14 each 0  . potassium chloride SA (K-DUR,KLOR-CON) 20 MEQ tablet Take 20 mEq by mouth daily.     . ranitidine (ZANTAC) 150 MG/10ML syrup Take 10 mLs (150 mg total) by mouth 2 (two) times daily. 300 mL 0  . sertraline (ZOLOFT) 50 MG tablet Take 50 mg by mouth daily.    . simethicone (MYLICON) 80 MG chewable tablet Chew 1 tablet (80 mg total) by mouth 4 (four) times daily as needed for flatulence. 30 tablet 0  . tamsulosin (FLOMAX) 0.4 MG CAPS capsule Take 1 capsule (0.4 mg total) by mouth daily after supper. 30 capsule   . donepezil (ARICEPT) 5 MG tablet Take 1 tablet (5 mg total) by mouth at bedtime. (Patient not taking: Reported on 06/30/2019) 30 tablet 0   No current facility-administered medications on file prior to visit.    ALLERGIES: Allergies  Allergen Reactions  . Amlodipine Shortness Of Breath, Swelling and Other (See Comments)    Edema in legs  . Methyldopa Shortness Of Breath and Swelling  . Shellfish-Derived Products Other (See Comments)    Syncopal episode  . Tape Itching and Other (See Comments)    Also TEARS THE SKIN, SO PLEASE USE AN ALTERNATIVE!!  Marland Kitchen Actonel [Risedronate Sodium] Other (See Comments)    Caused a lump to come up in throat and caused heartburn  . Bactrim [Sulfamethoxazole-Trimethoprim] Itching  . Ciprofloxacin Hcl Other (See Comments)    Confusion   . Gas-X [Simethicone] Itching  . Hydralazine Hcl Other (See Comments)    Unknown reaction, per patient   . Lactose Intolerance (Gi) Diarrhea  . Lisinopril Cough  .  Milk-Related Compounds Diarrhea  . Other Other (See Comments)    Orange juice : itching  . Oxycodone Hcl Other (See Comments)    Hallucinations   . Pneumococcal Vaccines Swelling and Other (See Comments)    Arm swells  . Valsartan Other (See Comments)  Unknown reaction, per pt  . Cozaar [Losartan] Rash  . Digitek [Digoxin] Rash  . Felodipine Rash  . Hctz [Hydrochlorothiazide] Itching and Rash    Pt reports causing "itching under the skin"  . Simvastatin Rash  . Spironolactone Itching and Rash    FAMILY HISTORY: Family History  Problem Relation Age of Onset  . Diabetes Mother   . Heart attack Mother   . Lung cancer Father   . Thyroid disease Sister   . Thyroid disease Sister   . Hypertension Sister   . Hypertension Sister   . Hypertension Sister   . Hypertension Sister   . Hypertension Brother   . Hypertension Brother   . Breast cancer Other 45  . Colon cancer Neg Hx   . Colon polyps Neg Hx   . Esophageal cancer Neg Hx   . Stroke Neg Hx     SOCIAL HISTORY: Social History   Socioeconomic History  . Marital status: Widowed    Spouse name: Not on file  . Number of children: 1  . Years of education: Not on file  . Highest education level: Not on file  Occupational History  . Occupation: Retired  Tobacco Use  . Smoking status: Former Smoker    Types: Cigarettes  . Smokeless tobacco: Never Used  Substance and Sexual Activity  . Alcohol use: No    Alcohol/week: 0.0 standard drinks  . Drug use: No  . Sexual activity: Never  Other Topics Concern  . Not on file  Social History Narrative  . Not on file   Social Determinants of Health   Financial Resource Strain:   . Difficulty of Paying Living Expenses:   Food Insecurity: No Food Insecurity  . Worried About Charity fundraiser in the Last Year: Never true  . Ran Out of Food in the Last Year: Never true  Transportation Needs: No Transportation Needs  . Lack of Transportation (Medical): No  . Lack of  Transportation (Non-Medical): No  Physical Activity:   . Days of Exercise per Week:   . Minutes of Exercise per Session:   Stress:   . Feeling of Stress :   Social Connections:   . Frequency of Communication with Friends and Family:   . Frequency of Social Gatherings with Friends and Family:   . Attends Religious Services:   . Active Member of Clubs or Organizations:   . Attends Archivist Meetings:   Marland Kitchen Marital Status:   Intimate Partner Violence:   . Fear of Current or Ex-Partner:   . Emotionally Abused:   Marland Kitchen Physically Abused:   . Sexually Abused:     PHYSICAL EXAM: Vitals:   07/06/19 1037  BP: 130/86  Pulse: 75  Resp: 20  SpO2: 95%   General: No acute distress Head:  Normocephalic/atraumatic Skin/Extremities: No rash, no edema Neurological Exam: Mental status: alert and oriented to person, state. Year is 2019. No dysarthria or aphasia, Fund of knowledge is reduced. Recent and remote memory are impaired.  Attention and concentration are reduced. SLUMS score 7/30.  [employer] Mental Exam 07/06/2019  Weekday Correct 0  Current year 0  What state are we in? 1  Amount spent 1  Amount left 0  # of Animals 0  5 objects recall 1  Number series 0  Hour markers 0  Time correct 0  Placed X in triangle correctly 1  Largest Figure 1  Name of female 0  Date back to work 0  Type  of work 2  State she lived in 0  Total score 7    Cranial nerves: CN I: not tested CN II: pupils equal, round and reactive to light, visual fields intact CN III, IV, VI:  full range of motion, no nystagmus, no ptosis CN V: facial sensation intact CN VII: upper and lower face symmetric CN VIII: hearing intact to conversation Bulk & Tone: no cogwheeling, no fasciculations. Motor: 5/5 throughout with no pronator drift. Sensation: intact to light touch, cold, pin, vibration sense.  No extinction to double simultaneous stimulation.  Romberg test negative Deep Tendon Reflexes: +1  throughout, no ankle clonus Cerebellar: no incoordination on finger to nose testing Gait: slow and cautious Tremor: none  IMPRESSION: This is a 86 year old right-handed woman with a history of mild dementia, atrial fibrillation, pulmonary hypertension, intraventricular hemorrhage in 2019 felt due to amyloid angiopathy versus hypertension, presenting for evaluation of syncope and dementia. Her neurological exam is non-focal, SLUMS score today 7/30. Concern has been raised for Lewy body dementia due to constellation of symptoms including visual hallucinations, fluctuating cognition (waxing and waning mentation), and delusions. No other parkinsonian signs today. MRI brain showed encephalomalacia and gliosis in the anterior right temporal lobe with associated right hippocampus volume loss and superficial siderosis, extensive chronic microvascular disease, diffuse cerebral volume loss, particularly in the bilateral parietal regions. We discussed the diagnosis of dementia and agreed with supportive care, she had side effects on Donepezil. Her daughter also reports episodes where she feels hot and would appear to have right hand flexion, seizures are still a possibility. EEG had shown generalized 3-5 Hz theta-delta slowing. Continue Levetiracetam 566m daily for now. Prolonged EEG may be helpful in the future, however we agreed on minimizing medications and procedures at this time. She will have home PT and caregivers coming in to help once a week, continue close supervision. Follow-up in 6 months, they know to call for any changes.   Thank you for allowing me to participate in the care of this patient. Please do not hesitate to call for any questions or concerns.   KEllouise Newer M.D.  CC: Dr. RAshby Dawes

## 2019-07-07 DIAGNOSIS — Z9181 History of falling: Secondary | ICD-10-CM | POA: Diagnosis not present

## 2019-07-07 DIAGNOSIS — S2242XD Multiple fractures of ribs, left side, subsequent encounter for fracture with routine healing: Secondary | ICD-10-CM | POA: Diagnosis not present

## 2019-07-07 DIAGNOSIS — I2510 Atherosclerotic heart disease of native coronary artery without angina pectoris: Secondary | ICD-10-CM | POA: Diagnosis not present

## 2019-07-07 DIAGNOSIS — I272 Pulmonary hypertension, unspecified: Secondary | ICD-10-CM | POA: Diagnosis not present

## 2019-07-07 DIAGNOSIS — F419 Anxiety disorder, unspecified: Secondary | ICD-10-CM | POA: Diagnosis not present

## 2019-07-07 DIAGNOSIS — G3183 Dementia with Lewy bodies: Secondary | ICD-10-CM | POA: Diagnosis not present

## 2019-07-07 DIAGNOSIS — I10 Essential (primary) hypertension: Secondary | ICD-10-CM | POA: Diagnosis not present

## 2019-07-07 DIAGNOSIS — W19XXXD Unspecified fall, subsequent encounter: Secondary | ICD-10-CM | POA: Diagnosis not present

## 2019-07-07 DIAGNOSIS — I482 Chronic atrial fibrillation, unspecified: Secondary | ICD-10-CM | POA: Diagnosis not present

## 2019-07-07 DIAGNOSIS — R42 Dizziness and giddiness: Secondary | ICD-10-CM | POA: Diagnosis not present

## 2019-07-07 DIAGNOSIS — M150 Primary generalized (osteo)arthritis: Secondary | ICD-10-CM | POA: Diagnosis not present

## 2019-07-07 DIAGNOSIS — F0281 Dementia in other diseases classified elsewhere with behavioral disturbance: Secondary | ICD-10-CM | POA: Diagnosis not present

## 2019-07-07 DIAGNOSIS — I081 Rheumatic disorders of both mitral and tricuspid valves: Secondary | ICD-10-CM | POA: Diagnosis not present

## 2019-07-07 DIAGNOSIS — E785 Hyperlipidemia, unspecified: Secondary | ICD-10-CM | POA: Diagnosis not present

## 2019-07-07 DIAGNOSIS — K2270 Barrett's esophagus without dysplasia: Secondary | ICD-10-CM | POA: Diagnosis not present

## 2019-07-25 ENCOUNTER — Encounter: Payer: Self-pay | Admitting: Neurology

## 2020-02-02 ENCOUNTER — Ambulatory Visit: Payer: Medicare Other | Admitting: Neurology

## 2020-02-23 DIAGNOSIS — I2510 Atherosclerotic heart disease of native coronary artery without angina pectoris: Secondary | ICD-10-CM | POA: Diagnosis not present

## 2020-02-23 DIAGNOSIS — M810 Age-related osteoporosis without current pathological fracture: Secondary | ICD-10-CM | POA: Diagnosis not present

## 2020-02-23 DIAGNOSIS — G3183 Dementia with Lewy bodies: Secondary | ICD-10-CM | POA: Diagnosis not present

## 2020-02-23 DIAGNOSIS — I4821 Permanent atrial fibrillation: Secondary | ICD-10-CM | POA: Diagnosis not present

## 2020-02-23 DIAGNOSIS — M159 Polyosteoarthritis, unspecified: Secondary | ICD-10-CM | POA: Diagnosis not present

## 2020-02-23 DIAGNOSIS — F0280 Dementia in other diseases classified elsewhere without behavioral disturbance: Secondary | ICD-10-CM | POA: Diagnosis not present

## 2020-02-23 DIAGNOSIS — I629 Nontraumatic intracranial hemorrhage, unspecified: Secondary | ICD-10-CM | POA: Diagnosis not present

## 2020-02-23 DIAGNOSIS — F5101 Primary insomnia: Secondary | ICD-10-CM | POA: Diagnosis not present

## 2020-02-23 DIAGNOSIS — I10 Essential (primary) hypertension: Secondary | ICD-10-CM | POA: Diagnosis not present

## 2020-06-11 ENCOUNTER — Other Ambulatory Visit: Payer: Self-pay

## 2020-06-11 ENCOUNTER — Emergency Department (HOSPITAL_COMMUNITY)
Admission: EM | Admit: 2020-06-11 | Discharge: 2020-06-12 | Disposition: A | Discharge status: Home / Self Care | Payer: Medicare Other | Attending: Emergency Medicine | Admitting: Emergency Medicine

## 2020-06-11 ENCOUNTER — Encounter (HOSPITAL_COMMUNITY): Payer: Self-pay

## 2020-06-11 DIAGNOSIS — I2510 Atherosclerotic heart disease of native coronary artery without angina pectoris: Secondary | ICD-10-CM | POA: Diagnosis not present

## 2020-06-11 DIAGNOSIS — I10 Essential (primary) hypertension: Secondary | ICD-10-CM | POA: Insufficient documentation

## 2020-06-11 DIAGNOSIS — Z8673 Personal history of transient ischemic attack (TIA), and cerebral infarction without residual deficits: Secondary | ICD-10-CM | POA: Diagnosis not present

## 2020-06-11 DIAGNOSIS — I959 Hypotension, unspecified: Secondary | ICD-10-CM | POA: Diagnosis not present

## 2020-06-11 DIAGNOSIS — F0390 Unspecified dementia without behavioral disturbance: Secondary | ICD-10-CM | POA: Insufficient documentation

## 2020-06-11 DIAGNOSIS — Z87891 Personal history of nicotine dependence: Secondary | ICD-10-CM | POA: Diagnosis not present

## 2020-06-11 DIAGNOSIS — Z79899 Other long term (current) drug therapy: Secondary | ICD-10-CM | POA: Insufficient documentation

## 2020-06-11 DIAGNOSIS — R5383 Other fatigue: Secondary | ICD-10-CM | POA: Diagnosis present

## 2020-06-11 DIAGNOSIS — E860 Dehydration: Secondary | ICD-10-CM

## 2020-06-11 DIAGNOSIS — N390 Urinary tract infection, site not specified: Secondary | ICD-10-CM | POA: Diagnosis not present

## 2020-06-11 DIAGNOSIS — R0902 Hypoxemia: Secondary | ICD-10-CM | POA: Diagnosis not present

## 2020-06-11 DIAGNOSIS — E1165 Type 2 diabetes mellitus with hyperglycemia: Secondary | ICD-10-CM | POA: Diagnosis not present

## 2020-06-11 DIAGNOSIS — R531 Weakness: Secondary | ICD-10-CM | POA: Diagnosis not present

## 2020-06-11 HISTORY — DX: Unspecified dementia, unspecified severity, without behavioral disturbance, psychotic disturbance, mood disturbance, and anxiety: F03.90

## 2020-06-11 LAB — URINALYSIS, ROUTINE W REFLEX MICROSCOPIC
Bilirubin Urine: NEGATIVE
Glucose, UA: NEGATIVE mg/dL
Ketones, ur: NEGATIVE mg/dL
Nitrite: POSITIVE — AB
Protein, ur: NEGATIVE mg/dL
Specific Gravity, Urine: 1.017 (ref 1.005–1.030)
WBC, UA: >50 WBC/hpf — ABNORMAL HIGH (ref 0–5)
pH: 5 (ref 5.0–8.0)

## 2020-06-11 LAB — BASIC METABOLIC PANEL
Anion gap: 14 (ref 5–15)
BUN: 22 mg/dL (ref 8–23)
CO2: 26 mmol/L (ref 22–32)
Calcium: 9.7 mg/dL (ref 8.9–10.3)
Chloride: 95 mmol/L — ABNORMAL LOW (ref 98–111)
Creatinine, Ser: 1.25 mg/dL — ABNORMAL HIGH (ref 0.44–1.00)
GFR, Estimated: 41 mL/min — ABNORMAL LOW (ref >60)
Glucose, Bld: 214 mg/dL — ABNORMAL HIGH (ref 70–99)
Potassium: 3.9 mmol/L (ref 3.5–5.1)
Sodium: 135 mmol/L (ref 135–145)

## 2020-06-11 LAB — CBC
HCT: 39.2 % (ref 36.0–46.0)
Hemoglobin: 13.2 g/dL (ref 12.0–15.0)
MCH: 31.1 pg (ref 26.0–34.0)
MCHC: 33.7 g/dL (ref 30.0–36.0)
MCV: 92.2 fL (ref 80.0–100.0)
Platelets: 283 10*3/uL (ref 150–400)
RBC: 4.25 MIL/uL (ref 3.87–5.11)
RDW: 13.2 % (ref 11.5–15.5)
WBC: 11.4 10*3/uL — ABNORMAL HIGH (ref 4.0–10.5)
nRBC: 0 % (ref 0.0–0.2)

## 2020-06-11 LAB — CBG MONITORING, ED: Glucose-Capillary: 184 mg/dL — ABNORMAL HIGH (ref 70–99)

## 2020-06-11 MED ORDER — CEPHALEXIN 500 MG PO CAPS
500.0000 mg | ORAL_CAPSULE | Freq: Three times a day (TID) | ORAL | 0 refills | Status: DC
Start: 2020-06-11 — End: 2020-07-13

## 2020-06-11 MED ORDER — SODIUM CHLORIDE 0.9 % IV SOLN
1.0000 g | Freq: Once | INTRAVENOUS | Status: AC
  Administered 2020-06-11: 1 g via INTRAVENOUS
  Filled 2020-06-11: qty 10

## 2020-06-11 MED ORDER — SODIUM CHLORIDE 0.9 % IV BOLUS
500.0000 mL | Freq: Once | INTRAVENOUS | Status: AC
  Administered 2020-06-11: 500 mL via INTRAVENOUS

## 2020-06-11 MED ORDER — ACETAMINOPHEN 325 MG PO TABS
650.0000 mg | ORAL_TABLET | Freq: Once | ORAL | Status: AC
  Administered 2020-06-11: 650 mg via ORAL
  Filled 2020-06-11: qty 2

## 2020-06-11 NOTE — ED Notes (Signed)
Provider messaged re: temp.

## 2020-06-11 NOTE — Social Work (Signed)
CSW met with Pt and daughter, [name] at bedside.  CSW provided list for [facility] memory care ASLs and SNFs and discussed possible future needs.  CSW also provided [facility] resources to assist family with locating in-home care that may be more suited to their needs.  Daughter verbalized thanks.

## 2020-06-11 NOTE — ED Provider Notes (Signed)
[facility] EMERGENCY DEPARTMENT Provider Note   CSN: [id number] Arrival date & time: 06/11/20  1557     History Chief Complaint  Patient presents with  . Weakness  . Fatigue    [name] is a 87 y.o. female.  Patient with history of dementia presents from home chief complaint of decreased energy decreased intake ongoing for the past week.  Family otherwise denies any recent fall or trauma.  No reports of fevers or cough or vomiting or diarrhea.  Family is concerned that her dementia is worsening and that she may have a urine infection.         Past Medical History:  Diagnosis Date  . Ankle fracture   . Anxiety   . Arthritis   . Barrett's esophagus   . Chronic atrial fibrillation ([place])    a. Pt previously declined DCCV.  Marland Kitchen Coronary artery disease    a. s/p Cypher DES to LAD in 2006;  b. Last LHC 02/2008:  pLAD 30%, mLAD stent patent, pOM 50%, EF 65%;  c. Lexiscan Myoview 7/14:  Intermediate risk, dist ant and inf-lat ischemia, EF 66% - patient declined cath and elected medical therapy.  . Dementia ([place])   . GERD (gastroesophageal reflux disease)   . Hiatal hernia   . Hx of echocardiogram    Echocardiogram 08/17/12: EF 55-60%, MAC, mild MR, mild LAE, mild RVE, mild to moderate RAE, moderate TR, mildly increased pulmonary artery systolic pressures  . Hyperlipidemia   . Hypertension   . Mitral regurgitation    a. Mod by echo 10/2014.  . Obesity   . Pulmonary hypertension ([place])    a. Mod-severely elevated PA pressures by echo 10/2014.  Marland Kitchen QT prolongation    a. During 10/2014 admit, QTC 563m.  . Tricuspid regurgitation    a. Mod by echo 10/2014.  .Marland KitchenUTI (lower urinary tract infection) 03/2015    Patient Active Problem List   Diagnosis Date Noted  . Pneumothorax on left 06/30/2019  . Lewy body dementia ([place] 05/18/2019  . Recurrent UTI--E coli/Pseudomonas 01/18/2018  . Orthostasis 01/18/2018  . Benign essential HTN   . Cerebral edema ([facility])   .  Hypomagnesemia   . Dysphagia, post-stroke   . Lethargy   . Acute blood loss anemia   . Hypoalbuminemia due to protein-calorie malnutrition ([place]   . History of Barrett's esophagus   . Urinary retention   . Seizure prophylaxis   . E. coli UTI   . [place]ventricular hemorrhage ([place]   . Urinary tract infection without hematuria   . Coronary artery disease involving native coronary artery of native heart without angina pectoris   . Pulmonary HTN ([place]   . Leukocytosis   . [place]acranial hemorrhage ([place] 12/18/2017  . Syncope 04/10/2015  . Sepsis secondary to UTI ([facility] 04/10/2015  . URI (upper respiratory infection) 04/10/2015  . UTI (lower urinary tract infection) 04/10/2015  . QT prolongation 10/12/2014  . Headache 09/02/2012  . Tricuspid valve regurgitation, moderate 08/18/2012  . Hypokalemia 08/16/2012  . Hyponatremia 08/16/2012  . Atrial fibrillation ([place]   . Hypertension   . Anxiety   . Mixed hyperlipidemia 09/11/2009  . GERD 08/30/2008  . CHEST PAIN 08/30/2008  . Coronary atherosclerosis 04/04/2008  . Syncope and collapse 04/04/2008    Past Surgical History:  Procedure Laterality Date  . ABDOMINAL HYSTERECTOMY    . ANKLE SURGERY  2001  . BREAST EXCISIONAL BIOPSY Right 1970  . BREAST EXCISIONAL BIOPSY Right   .  BREAST EXCISIONAL BIOPSY Left   . CESAREAN SECTION    . CORONARY ANGIOPLASTY    . ESOPHAGOGASTRODUODENOSCOPY       OB History   No obstetric history on file.     Family History  Problem Relation Age of Onset  . Diabetes Mother   . Heart attack Mother   . Lung cancer Father   . Thyroid disease Sister   . Thyroid disease Sister   . Hypertension Sister   . Hypertension Sister   . Hypertension Sister   . Hypertension Sister   . Hypertension Brother   . Hypertension Brother   . Breast cancer Other 45  . Colon cancer Neg Hx   . Colon polyps Neg Hx   . Esophageal cancer Neg Hx   . Stroke Neg Hx     Social History   Tobacco Use  . Smoking  status: Former Smoker    Types: Cigarettes  . Smokeless tobacco: Never Used  Vaping Use  . Vaping Use: Never used  Substance Use Topics  . Alcohol use: No    Alcohol/week: 0.0 standard drinks  . Drug use: No    Home Medications Prior to Admission medications   Medication Sig Start Date End Date Taking? Authorizing Provider  cephALEXin (KEFLEX) 500 MG capsule Take 1 capsule (500 mg total) by mouth 3 (three) times daily. 06/11/20  Yes Luna Fuse, MD  acetaminophen (TYLENOL) 500 MG tablet Take 2 tablets (1,000 mg total) by mouth every 6 (six) hours. 07/03/19   Norm Parcel, PA-C  docusate sodium (COLACE) 100 MG capsule Take 1 capsule (100 mg total) by mouth 2 (two) times daily. 01/18/18   Love, Ivan Anchors, PA-C  irbesartan (AVAPRO) 75 MG tablet Take 75 mg by mouth daily.    [provider]  levETIRAcetam (KEPPRA) 500 MG tablet Take 1 tablet (500 mg total) by mouth 2 (two) times daily. Patient taking differently: Take 500 mg by mouth in the morning.  12/23/17   Costella, Vista Mink, PA-C  lidocaine (LIDODERM) 5 % Place 1 patch onto the skin daily. Remove & Discard patch within 12 hours or as directed by MD 07/02/19   Norm Parcel, PA-C  magnesium oxide (MAG-OX) 400 (241.3 Mg) MG tablet Take 0.5 tablets (200 mg total) by mouth 2 (two) times daily. 01/18/18   Love, Ivan Anchors, PA-C  Melatonin 5 MG TABS Take 5 mg by mouth at bedtime as needed (for sleep/anxiety).     [provider]  Menthol-Methyl Salicylate (MUSCLE RUB) 10-15 % CREA Apply 1 application topically every 6 (six) hours. To bilateral knees 01/18/18   Love, Ivan Anchors, PA-C  metoprolol tartrate (LOPRESSOR) 50 MG tablet Take 1 tablet (50 mg total) by mouth 2 (two) times daily. Patient taking differently: Take 50 mg by mouth daily.  01/18/18   Love, Ivan Anchors, PA-C  mouth rinse LIQD solution 15 mLs by Mouth Rinse route 2 (two) times daily. 01/18/18   Love, Ivan Anchors, PA-C  Multiple Vitamin (MULTIVITAMIN WITH MINERALS) TABS  tablet Take 1 tablet by mouth daily. 01/19/18   Love, Ivan Anchors, PA-C  nitroGLYCERIN (NITROSTAT) 0.4 MG SL tablet Place 1 tablet (0.4 mg total) under the tongue every 5 (five) minutes as needed for chest pain (3 doses only). Patient taking differently: Place 0.4 mg under the tongue every 5 (five) minutes x 3 doses as needed for chest pain.  01/18/18   Love, Ivan Anchors, PA-C  omeprazole (PRILOSEC OTC) 20 MG tablet Take  20 mg by mouth daily before breakfast.    [provider]  polyethylene glycol (MIRALAX / GLYCOLAX) packet Take 17 g by mouth daily. 01/19/18   Love, Ivan Anchors, PA-C  potassium chloride SA (K-DUR,KLOR-CON) 20 MEQ tablet Take 20 mEq by mouth daily.     [provider]  ranitidine (ZANTAC) 150 MG/10ML syrup Take 10 mLs (150 mg total) by mouth 2 (two) times daily. 01/18/18   Love, Ivan Anchors, PA-C  sertraline (ZOLOFT) 50 MG tablet Take 50 mg by mouth daily.    [provider]  simethicone (MYLICON) 80 MG chewable tablet Chew 1 tablet (80 mg total) by mouth 4 (four) times daily as needed for flatulence. 01/18/18   Love, Ivan Anchors, PA-C  tamsulosin (FLOMAX) 0.4 MG CAPS capsule Take 1 capsule (0.4 mg total) by mouth daily after supper. 01/18/18   Love, Ivan Anchors, PA-C    Allergies    Amlodipine, Methyldopa, Shellfish-derived products, Tape, Actonel [risedronate sodium], Bactrim [sulfamethoxazole-trimethoprim], Ciprofloxacin hcl, Gas-x [simethicone], Hydralazine hcl, Lactose intolerance (gi), Lisinopril, Milk-related compounds, Other, Oxycodone hcl, Pneumococcal vaccines, Valsartan, Cozaar [losartan], Digitek [digoxin], Felodipine, Hctz [hydrochlorothiazide], Simvastatin, and Spironolactone  Review of Systems   Review of Systems  Unable to perform ROS: Dementia    Physical Exam Updated Vital Signs BP (!) 149/53   Pulse 74   Temp (!) 97.5 F (36.4 C) (Axillary)   Resp 17   Ht _0  (1.626 m)   Wt 67.6 kg   SpO2 94%   BMI 25.58 kg/m   Physical Exam Constitutional:       General: She is not in acute distress.    Appearance: Normal appearance.  HENT:     Head: Normocephalic.     Nose: Nose normal.  Eyes:     Extraocular Movements: Extraocular movements intact.  Cardiovascular:     Rate and Rhythm: Normal rate.  Pulmonary:     Effort: Pulmonary effort is normal.  Musculoskeletal:        General: Normal range of motion.     Cervical back: Normal range of motion.  Neurological:     General: No focal deficit present.     Mental Status: She is alert. Mental status is at baseline.     Comments: Patient awake alert.  Answers questions but appears confused.  Mentally at her baseline per family.     ED Results / Procedures / Treatments   Labs (all labs ordered are listed, but only abnormal results are displayed) Labs Reviewed  BASIC METABOLIC PANEL - Abnormal; Notable for the following components:      Result Value   Chloride 95 (*)    Glucose, Bld 214 (*)    Creatinine, Ser 1.25 (*)    GFR, Estimated 41 (*)    All other components within normal limits  CBC - Abnormal; Notable for the following components:   WBC 11.4 (*)    All other components within normal limits  URINALYSIS, ROUTINE W REFLEX MICROSCOPIC - Abnormal; Notable for the following components:   APPearance CLOUDY (*)    Hgb urine dipstick SMALL (*)    Nitrite POSITIVE (*)    Leukocytes,Ua LARGE (*)    WBC, UA >50 (*)    Bacteria, UA FEW (*)    All other components within normal limits  CBG MONITORING, ED - Abnormal; Notable for the following components:   Glucose-Capillary 184 (*)    All other components within normal limits    EKG EKG Interpretation  Date/Time:  Monday June 11 2020 16:04:26 EDT Ventricular Rate:  60 PR Interval:  227 QRS Duration: 107 QT Interval:  455 QTC Calculation: 455 R Axis:   -41 Text Interpretation: Sinus or ectopic atrial rhythm Prolonged PR interval Left ventricular hypertrophy Confirmed by Thamas Jaegers ([id number]) on 06/11/2020 4:09:37  PM   Radiology No results found.  Procedures Procedures   Medications Ordered in ED Medications  cefTRIAXone (ROCEPHIN) 1 g in sodium chloride 0.9 % 100 mL IVPB (has no administration in time range)  acetaminophen (TYLENOL) tablet 650 mg (650 mg Oral Given 06/11/20 1919)  sodium chloride 0.9 % bolus 500 mL (0 mLs Intravenous Stopped 06/11/20 2046)    ED Course  I have reviewed the triage vital signs and the nursing notes.  Pertinent labs & imaging results that were available during my care of the patient were reviewed by me and considered in my medical decision making (see chart for details).    MDM Rules/Calculators/A&P                          Labs show white count of 11 hemoglobin 13 chemistry shows creatinine of 1.2 which appears elevated from her baseline.  Bicarb normal at 26.  Urinalysis positive for nitrites and leukocytes concerning for urinary tract infection.  Patient given a gram of Rocephin here in the ER.  Prescription of Keflex to go home with.  I discussed with her family the patient's end-of-life goals.  They wish to keep her at home as long as they can but are interested in looking into memory units.  Social worker consultation requested to give the patient's and family more information and resources regarding memory care units.  Patient otherwise discharged home in stable condition.  Final Clinical Impression(s) / ED Diagnoses Final diagnoses:  Dehydration  Lower urinary tract infectious disease    Rx / DC Orders ED Discharge Orders         Ordered    cephALEXin (KEFLEX) 500 MG capsule  3 times daily        06/11/20 2147           Luna Fuse, MD 06/11/20 2149

## 2020-06-11 NOTE — ED Notes (Signed)
Ptar was called @[phone] 

## 2020-06-11 NOTE — ED Triage Notes (Signed)
Pt arrived to ED via EMS from home w/ c/o weakness and lethargy x 8 days since Easter Sunday. Per family, pt hasn't been acting right, reporting pt more weak and falling asleep at the dinner table. Family reports pt has had decreased appetite. Per family pt usually ambulated with assistance, but hasn't been doing that and has been staying in the bed. Pt has hx of dementia and is oriented x 1 at baseline. EMS VS: BP 124/90, HR 70, RR 20, 94% RA, CBG 331

## 2020-06-11 NOTE — Discharge Instructions (Signed)
Call your primary care doctor or specialist as discussed in the next 2-3 days.   Return immediately back to the ER if:  Your symptoms worsen within the next 12-24 hours. You develop new symptoms such as new fevers, persistent vomiting, new pain, shortness of breath, or new weakness or numbness, or if you have any other concerns.  

## 2020-06-12 DIAGNOSIS — R0902 Hypoxemia: Secondary | ICD-10-CM | POA: Diagnosis not present

## 2020-06-12 DIAGNOSIS — R279 Unspecified lack of coordination: Secondary | ICD-10-CM | POA: Diagnosis not present

## 2020-06-12 DIAGNOSIS — Z743 Need for continuous supervision: Secondary | ICD-10-CM | POA: Diagnosis not present

## 2020-06-12 DIAGNOSIS — R4182 Altered mental status, unspecified: Secondary | ICD-10-CM | POA: Diagnosis not present

## 2020-06-12 DIAGNOSIS — R410 Disorientation, unspecified: Secondary | ICD-10-CM | POA: Diagnosis not present

## 2020-06-12 NOTE — ED Notes (Signed)
PTAR at bedside 

## 2020-06-21 DIAGNOSIS — Z79899 Other long term (current) drug therapy: Secondary | ICD-10-CM | POA: Diagnosis not present

## 2020-06-21 DIAGNOSIS — R531 Weakness: Secondary | ICD-10-CM | POA: Diagnosis not present

## 2020-06-21 DIAGNOSIS — E876 Hypokalemia: Secondary | ICD-10-CM | POA: Diagnosis not present

## 2020-06-21 DIAGNOSIS — Z8679 Personal history of other diseases of the circulatory system: Secondary | ICD-10-CM | POA: Diagnosis not present

## 2020-06-21 DIAGNOSIS — R2681 Unsteadiness on feet: Secondary | ICD-10-CM | POA: Diagnosis not present

## 2020-06-21 DIAGNOSIS — S81801A Unspecified open wound, right lower leg, initial encounter: Secondary | ICD-10-CM | POA: Diagnosis not present

## 2020-06-21 DIAGNOSIS — G479 Sleep disorder, unspecified: Secondary | ICD-10-CM | POA: Diagnosis not present

## 2020-06-21 DIAGNOSIS — I480 Paroxysmal atrial fibrillation: Secondary | ICD-10-CM | POA: Diagnosis not present

## 2020-06-21 DIAGNOSIS — I10 Essential (primary) hypertension: Secondary | ICD-10-CM | POA: Diagnosis not present

## 2020-06-21 DIAGNOSIS — W19XXXA Unspecified fall, initial encounter: Secondary | ICD-10-CM | POA: Diagnosis not present

## 2020-06-21 DIAGNOSIS — I2510 Atherosclerotic heart disease of native coronary artery without angina pectoris: Secondary | ICD-10-CM | POA: Diagnosis not present

## 2020-06-26 DIAGNOSIS — R531 Weakness: Secondary | ICD-10-CM | POA: Diagnosis not present

## 2020-06-26 DIAGNOSIS — M6281 Muscle weakness (generalized): Secondary | ICD-10-CM | POA: Diagnosis not present

## 2020-06-26 DIAGNOSIS — R2681 Unsteadiness on feet: Secondary | ICD-10-CM | POA: Diagnosis not present

## 2020-06-26 DIAGNOSIS — I10 Essential (primary) hypertension: Secondary | ICD-10-CM | POA: Diagnosis not present

## 2020-06-26 DIAGNOSIS — R41841 Cognitive communication deficit: Secondary | ICD-10-CM | POA: Diagnosis not present

## 2020-06-27 DIAGNOSIS — M6281 Muscle weakness (generalized): Secondary | ICD-10-CM | POA: Diagnosis not present

## 2020-06-27 DIAGNOSIS — F0390 Unspecified dementia without behavioral disturbance: Secondary | ICD-10-CM | POA: Diagnosis not present

## 2020-06-27 DIAGNOSIS — S51011D Laceration without foreign body of right elbow, subsequent encounter: Secondary | ICD-10-CM | POA: Diagnosis not present

## 2020-06-27 DIAGNOSIS — G4700 Insomnia, unspecified: Secondary | ICD-10-CM | POA: Diagnosis not present

## 2020-06-27 DIAGNOSIS — I10 Essential (primary) hypertension: Secondary | ICD-10-CM | POA: Diagnosis not present

## 2020-06-27 DIAGNOSIS — R41841 Cognitive communication deficit: Secondary | ICD-10-CM | POA: Diagnosis not present

## 2020-06-27 DIAGNOSIS — E860 Dehydration: Secondary | ICD-10-CM | POA: Diagnosis not present

## 2020-06-27 DIAGNOSIS — Z9181 History of falling: Secondary | ICD-10-CM | POA: Diagnosis not present

## 2020-06-27 DIAGNOSIS — N390 Urinary tract infection, site not specified: Secondary | ICD-10-CM | POA: Diagnosis not present

## 2020-06-27 DIAGNOSIS — I2510 Atherosclerotic heart disease of native coronary artery without angina pectoris: Secondary | ICD-10-CM | POA: Diagnosis not present

## 2020-06-27 DIAGNOSIS — R531 Weakness: Secondary | ICD-10-CM | POA: Diagnosis not present

## 2020-06-27 DIAGNOSIS — S51811D Laceration without foreign body of right forearm, subsequent encounter: Secondary | ICD-10-CM | POA: Diagnosis not present

## 2020-06-27 DIAGNOSIS — I4891 Unspecified atrial fibrillation: Secondary | ICD-10-CM | POA: Diagnosis not present

## 2020-06-27 DIAGNOSIS — R2681 Unsteadiness on feet: Secondary | ICD-10-CM | POA: Diagnosis not present

## 2020-06-29 DIAGNOSIS — R41841 Cognitive communication deficit: Secondary | ICD-10-CM | POA: Diagnosis not present

## 2020-06-29 DIAGNOSIS — R2681 Unsteadiness on feet: Secondary | ICD-10-CM | POA: Diagnosis not present

## 2020-06-29 DIAGNOSIS — R531 Weakness: Secondary | ICD-10-CM | POA: Diagnosis not present

## 2020-06-29 DIAGNOSIS — I10 Essential (primary) hypertension: Secondary | ICD-10-CM | POA: Diagnosis not present

## 2020-06-29 DIAGNOSIS — M6281 Muscle weakness (generalized): Secondary | ICD-10-CM | POA: Diagnosis not present

## 2020-07-02 DIAGNOSIS — R2681 Unsteadiness on feet: Secondary | ICD-10-CM | POA: Diagnosis not present

## 2020-07-02 DIAGNOSIS — R531 Weakness: Secondary | ICD-10-CM | POA: Diagnosis not present

## 2020-07-02 DIAGNOSIS — R41841 Cognitive communication deficit: Secondary | ICD-10-CM | POA: Diagnosis not present

## 2020-07-02 DIAGNOSIS — M6281 Muscle weakness (generalized): Secondary | ICD-10-CM | POA: Diagnosis not present

## 2020-07-02 DIAGNOSIS — I10 Essential (primary) hypertension: Secondary | ICD-10-CM | POA: Diagnosis not present

## 2020-07-03 DIAGNOSIS — R531 Weakness: Secondary | ICD-10-CM | POA: Diagnosis not present

## 2020-07-03 DIAGNOSIS — I10 Essential (primary) hypertension: Secondary | ICD-10-CM | POA: Diagnosis not present

## 2020-07-03 DIAGNOSIS — M6281 Muscle weakness (generalized): Secondary | ICD-10-CM | POA: Diagnosis not present

## 2020-07-03 DIAGNOSIS — R2681 Unsteadiness on feet: Secondary | ICD-10-CM | POA: Diagnosis not present

## 2020-07-03 DIAGNOSIS — R41841 Cognitive communication deficit: Secondary | ICD-10-CM | POA: Diagnosis not present

## 2020-07-04 DIAGNOSIS — R531 Weakness: Secondary | ICD-10-CM | POA: Diagnosis not present

## 2020-07-04 DIAGNOSIS — R41841 Cognitive communication deficit: Secondary | ICD-10-CM | POA: Diagnosis not present

## 2020-07-04 DIAGNOSIS — M6281 Muscle weakness (generalized): Secondary | ICD-10-CM | POA: Diagnosis not present

## 2020-07-04 DIAGNOSIS — S51811D Laceration without foreign body of right forearm, subsequent encounter: Secondary | ICD-10-CM | POA: Diagnosis not present

## 2020-07-04 DIAGNOSIS — N390 Urinary tract infection, site not specified: Secondary | ICD-10-CM | POA: Diagnosis not present

## 2020-07-04 DIAGNOSIS — S51011D Laceration without foreign body of right elbow, subsequent encounter: Secondary | ICD-10-CM | POA: Diagnosis not present

## 2020-07-04 DIAGNOSIS — R2681 Unsteadiness on feet: Secondary | ICD-10-CM | POA: Diagnosis not present

## 2020-07-04 DIAGNOSIS — E860 Dehydration: Secondary | ICD-10-CM | POA: Diagnosis not present

## 2020-07-04 DIAGNOSIS — I10 Essential (primary) hypertension: Secondary | ICD-10-CM | POA: Diagnosis not present

## 2020-07-04 DIAGNOSIS — I2510 Atherosclerotic heart disease of native coronary artery without angina pectoris: Secondary | ICD-10-CM | POA: Diagnosis not present

## 2020-07-05 DIAGNOSIS — N390 Urinary tract infection, site not specified: Secondary | ICD-10-CM | POA: Diagnosis not present

## 2020-07-05 DIAGNOSIS — S51011D Laceration without foreign body of right elbow, subsequent encounter: Secondary | ICD-10-CM | POA: Diagnosis not present

## 2020-07-05 DIAGNOSIS — I10 Essential (primary) hypertension: Secondary | ICD-10-CM | POA: Diagnosis not present

## 2020-07-05 DIAGNOSIS — S51811D Laceration without foreign body of right forearm, subsequent encounter: Secondary | ICD-10-CM | POA: Diagnosis not present

## 2020-07-05 DIAGNOSIS — I2510 Atherosclerotic heart disease of native coronary artery without angina pectoris: Secondary | ICD-10-CM | POA: Diagnosis not present

## 2020-07-05 DIAGNOSIS — E860 Dehydration: Secondary | ICD-10-CM | POA: Diagnosis not present

## 2020-07-06 DIAGNOSIS — I2510 Atherosclerotic heart disease of native coronary artery without angina pectoris: Secondary | ICD-10-CM | POA: Diagnosis not present

## 2020-07-06 DIAGNOSIS — M6281 Muscle weakness (generalized): Secondary | ICD-10-CM | POA: Diagnosis not present

## 2020-07-06 DIAGNOSIS — I10 Essential (primary) hypertension: Secondary | ICD-10-CM | POA: Diagnosis not present

## 2020-07-06 DIAGNOSIS — R531 Weakness: Secondary | ICD-10-CM | POA: Diagnosis not present

## 2020-07-06 DIAGNOSIS — S51011D Laceration without foreign body of right elbow, subsequent encounter: Secondary | ICD-10-CM | POA: Diagnosis not present

## 2020-07-06 DIAGNOSIS — R2681 Unsteadiness on feet: Secondary | ICD-10-CM | POA: Diagnosis not present

## 2020-07-06 DIAGNOSIS — E860 Dehydration: Secondary | ICD-10-CM | POA: Diagnosis not present

## 2020-07-06 DIAGNOSIS — N390 Urinary tract infection, site not specified: Secondary | ICD-10-CM | POA: Diagnosis not present

## 2020-07-06 DIAGNOSIS — S51811D Laceration without foreign body of right forearm, subsequent encounter: Secondary | ICD-10-CM | POA: Diagnosis not present

## 2020-07-06 DIAGNOSIS — R41841 Cognitive communication deficit: Secondary | ICD-10-CM | POA: Diagnosis not present

## 2020-07-09 DIAGNOSIS — R41841 Cognitive communication deficit: Secondary | ICD-10-CM | POA: Diagnosis not present

## 2020-07-09 DIAGNOSIS — M6281 Muscle weakness (generalized): Secondary | ICD-10-CM | POA: Diagnosis not present

## 2020-07-09 DIAGNOSIS — R531 Weakness: Secondary | ICD-10-CM | POA: Diagnosis not present

## 2020-07-09 DIAGNOSIS — I10 Essential (primary) hypertension: Secondary | ICD-10-CM | POA: Diagnosis not present

## 2020-07-09 DIAGNOSIS — R2681 Unsteadiness on feet: Secondary | ICD-10-CM | POA: Diagnosis not present

## 2020-07-10 DIAGNOSIS — M6281 Muscle weakness (generalized): Secondary | ICD-10-CM | POA: Diagnosis not present

## 2020-07-10 DIAGNOSIS — R531 Weakness: Secondary | ICD-10-CM | POA: Diagnosis not present

## 2020-07-10 DIAGNOSIS — R2681 Unsteadiness on feet: Secondary | ICD-10-CM | POA: Diagnosis not present

## 2020-07-10 DIAGNOSIS — R41841 Cognitive communication deficit: Secondary | ICD-10-CM | POA: Diagnosis not present

## 2020-07-10 DIAGNOSIS — I10 Essential (primary) hypertension: Secondary | ICD-10-CM | POA: Diagnosis not present

## 2020-07-11 DIAGNOSIS — R531 Weakness: Secondary | ICD-10-CM | POA: Diagnosis not present

## 2020-07-11 DIAGNOSIS — R41841 Cognitive communication deficit: Secondary | ICD-10-CM | POA: Diagnosis not present

## 2020-07-11 DIAGNOSIS — S51011D Laceration without foreign body of right elbow, subsequent encounter: Secondary | ICD-10-CM | POA: Diagnosis not present

## 2020-07-11 DIAGNOSIS — R2681 Unsteadiness on feet: Secondary | ICD-10-CM | POA: Diagnosis not present

## 2020-07-11 DIAGNOSIS — I2510 Atherosclerotic heart disease of native coronary artery without angina pectoris: Secondary | ICD-10-CM | POA: Diagnosis not present

## 2020-07-11 DIAGNOSIS — E860 Dehydration: Secondary | ICD-10-CM | POA: Diagnosis not present

## 2020-07-11 DIAGNOSIS — I10 Essential (primary) hypertension: Secondary | ICD-10-CM | POA: Diagnosis not present

## 2020-07-11 DIAGNOSIS — M6281 Muscle weakness (generalized): Secondary | ICD-10-CM | POA: Diagnosis not present

## 2020-07-11 DIAGNOSIS — N390 Urinary tract infection, site not specified: Secondary | ICD-10-CM | POA: Diagnosis not present

## 2020-07-11 DIAGNOSIS — S51811D Laceration without foreign body of right forearm, subsequent encounter: Secondary | ICD-10-CM | POA: Diagnosis not present

## 2020-07-12 DIAGNOSIS — I10 Essential (primary) hypertension: Secondary | ICD-10-CM | POA: Diagnosis not present

## 2020-07-12 DIAGNOSIS — M6281 Muscle weakness (generalized): Secondary | ICD-10-CM | POA: Diagnosis not present

## 2020-07-12 DIAGNOSIS — R2681 Unsteadiness on feet: Secondary | ICD-10-CM | POA: Diagnosis not present

## 2020-07-12 DIAGNOSIS — R531 Weakness: Secondary | ICD-10-CM | POA: Diagnosis not present

## 2020-07-12 DIAGNOSIS — R41841 Cognitive communication deficit: Secondary | ICD-10-CM | POA: Diagnosis not present

## 2020-07-13 ENCOUNTER — Inpatient Hospital Stay (HOSPITAL_COMMUNITY): Payer: Medicare Other | Admitting: Anesthesiology

## 2020-07-13 ENCOUNTER — Other Ambulatory Visit: Payer: Self-pay

## 2020-07-13 ENCOUNTER — Inpatient Hospital Stay (HOSPITAL_COMMUNITY): Payer: Medicare Other

## 2020-07-13 ENCOUNTER — Emergency Department (HOSPITAL_COMMUNITY): Payer: Medicare Other

## 2020-07-13 ENCOUNTER — Inpatient Hospital Stay (HOSPITAL_COMMUNITY)
Admission: EM | Admit: 2020-07-13 | Discharge: 2020-07-19 | DRG: 521 | Disposition: A | Discharge status: Skilled Nursing Facility | Payer: Medicare Other | Source: Skilled Nursing Facility | Attending: Internal Medicine | Admitting: Internal Medicine

## 2020-07-13 ENCOUNTER — Encounter (HOSPITAL_COMMUNITY)
Admission: EM | Disposition: A | Discharge status: Skilled Nursing Facility | Payer: Self-pay | Source: Skilled Nursing Facility | Attending: Internal Medicine

## 2020-07-13 ENCOUNTER — Encounter (HOSPITAL_COMMUNITY): Payer: Self-pay | Admitting: Internal Medicine

## 2020-07-13 DIAGNOSIS — R0902 Hypoxemia: Secondary | ICD-10-CM | POA: Diagnosis not present

## 2020-07-13 DIAGNOSIS — Z20822 Contact with and (suspected) exposure to covid-19: Secondary | ICD-10-CM | POA: Diagnosis present

## 2020-07-13 DIAGNOSIS — R2681 Unsteadiness on feet: Secondary | ICD-10-CM | POA: Diagnosis not present

## 2020-07-13 DIAGNOSIS — S0990XA Unspecified injury of head, initial encounter: Secondary | ICD-10-CM | POA: Diagnosis not present

## 2020-07-13 DIAGNOSIS — W19XXXA Unspecified fall, initial encounter: Secondary | ICD-10-CM | POA: Diagnosis present

## 2020-07-13 DIAGNOSIS — M25551 Pain in right hip: Secondary | ICD-10-CM | POA: Diagnosis not present

## 2020-07-13 DIAGNOSIS — I10 Essential (primary) hypertension: Secondary | ICD-10-CM | POA: Diagnosis not present

## 2020-07-13 DIAGNOSIS — Z87891 Personal history of nicotine dependence: Secondary | ICD-10-CM | POA: Diagnosis not present

## 2020-07-13 DIAGNOSIS — Y92128 Other place in nursing home as the place of occurrence of the external cause: Secondary | ICD-10-CM

## 2020-07-13 DIAGNOSIS — Z90710 Acquired absence of both cervix and uterus: Secondary | ICD-10-CM

## 2020-07-13 DIAGNOSIS — G319 Degenerative disease of nervous system, unspecified: Secondary | ICD-10-CM | POA: Diagnosis not present

## 2020-07-13 DIAGNOSIS — F419 Anxiety disorder, unspecified: Secondary | ICD-10-CM | POA: Diagnosis present

## 2020-07-13 DIAGNOSIS — D62 Acute posthemorrhagic anemia: Secondary | ICD-10-CM | POA: Diagnosis present

## 2020-07-13 DIAGNOSIS — N390 Urinary tract infection, site not specified: Secondary | ICD-10-CM | POA: Diagnosis present

## 2020-07-13 DIAGNOSIS — Z66 Do not resuscitate: Secondary | ICD-10-CM | POA: Diagnosis not present

## 2020-07-13 DIAGNOSIS — S72041A Displaced fracture of base of neck of right femur, initial encounter for closed fracture: Secondary | ICD-10-CM | POA: Diagnosis not present

## 2020-07-13 DIAGNOSIS — R41841 Cognitive communication deficit: Secondary | ICD-10-CM | POA: Diagnosis not present

## 2020-07-13 DIAGNOSIS — I69254 Hemiplegia and hemiparesis following other nontraumatic intracranial hemorrhage affecting left non-dominant side: Secondary | ICD-10-CM

## 2020-07-13 DIAGNOSIS — R296 Repeated falls: Secondary | ICD-10-CM

## 2020-07-13 DIAGNOSIS — E785 Hyperlipidemia, unspecified: Secondary | ICD-10-CM | POA: Diagnosis present

## 2020-07-13 DIAGNOSIS — I272 Pulmonary hypertension, unspecified: Secondary | ICD-10-CM | POA: Diagnosis present

## 2020-07-13 DIAGNOSIS — R5381 Other malaise: Secondary | ICD-10-CM | POA: Diagnosis present

## 2020-07-13 DIAGNOSIS — S0101XA Laceration without foreign body of scalp, initial encounter: Secondary | ICD-10-CM | POA: Diagnosis not present

## 2020-07-13 DIAGNOSIS — E782 Mixed hyperlipidemia: Secondary | ICD-10-CM | POA: Diagnosis not present

## 2020-07-13 DIAGNOSIS — Z955 Presence of coronary angioplasty implant and graft: Secondary | ICD-10-CM

## 2020-07-13 DIAGNOSIS — I629 Nontraumatic intracranial hemorrhage, unspecified: Secondary | ICD-10-CM | POA: Diagnosis not present

## 2020-07-13 DIAGNOSIS — Z23 Encounter for immunization: Secondary | ICD-10-CM

## 2020-07-13 DIAGNOSIS — M6281 Muscle weakness (generalized): Secondary | ICD-10-CM | POA: Diagnosis not present

## 2020-07-13 DIAGNOSIS — F0390 Unspecified dementia without behavioral disturbance: Secondary | ICD-10-CM

## 2020-07-13 DIAGNOSIS — E860 Dehydration: Secondary | ICD-10-CM | POA: Diagnosis not present

## 2020-07-13 DIAGNOSIS — I4891 Unspecified atrial fibrillation: Secondary | ICD-10-CM | POA: Diagnosis not present

## 2020-07-13 DIAGNOSIS — D72828 Other elevated white blood cell count: Secondary | ICD-10-CM | POA: Diagnosis present

## 2020-07-13 DIAGNOSIS — I9589 Other hypotension: Secondary | ICD-10-CM | POA: Diagnosis present

## 2020-07-13 DIAGNOSIS — C719 Malignant neoplasm of brain, unspecified: Secondary | ICD-10-CM | POA: Diagnosis not present

## 2020-07-13 DIAGNOSIS — I482 Chronic atrial fibrillation, unspecified: Secondary | ICD-10-CM | POA: Diagnosis not present

## 2020-07-13 DIAGNOSIS — Z79899 Other long term (current) drug therapy: Secondary | ICD-10-CM | POA: Diagnosis not present

## 2020-07-13 DIAGNOSIS — S51811D Laceration without foreign body of right forearm, subsequent encounter: Secondary | ICD-10-CM | POA: Diagnosis not present

## 2020-07-13 DIAGNOSIS — J9601 Acute respiratory failure with hypoxia: Secondary | ICD-10-CM | POA: Diagnosis not present

## 2020-07-13 DIAGNOSIS — E739 Lactose intolerance, unspecified: Secondary | ICD-10-CM | POA: Diagnosis present

## 2020-07-13 DIAGNOSIS — R54 Age-related physical debility: Secondary | ICD-10-CM | POA: Diagnosis present

## 2020-07-13 DIAGNOSIS — I2510 Atherosclerotic heart disease of native coronary artery without angina pectoris: Secondary | ICD-10-CM | POA: Diagnosis present

## 2020-07-13 DIAGNOSIS — W1830XA Fall on same level, unspecified, initial encounter: Secondary | ICD-10-CM | POA: Diagnosis present

## 2020-07-13 DIAGNOSIS — K219 Gastro-esophageal reflux disease without esophagitis: Secondary | ICD-10-CM | POA: Diagnosis present

## 2020-07-13 DIAGNOSIS — R278 Other lack of coordination: Secondary | ICD-10-CM | POA: Diagnosis not present

## 2020-07-13 DIAGNOSIS — I959 Hypotension, unspecified: Secondary | ICD-10-CM | POA: Diagnosis not present

## 2020-07-13 DIAGNOSIS — S72011A Unspecified intracapsular fracture of right femur, initial encounter for closed fracture: Principal | ICD-10-CM

## 2020-07-13 DIAGNOSIS — S199XXA Unspecified injury of neck, initial encounter: Secondary | ICD-10-CM | POA: Diagnosis not present

## 2020-07-13 DIAGNOSIS — F0391 Unspecified dementia with behavioral disturbance: Secondary | ICD-10-CM | POA: Diagnosis present

## 2020-07-13 DIAGNOSIS — I700 Atherosclerosis of aorta: Secondary | ICD-10-CM | POA: Diagnosis not present

## 2020-07-13 DIAGNOSIS — I6381 Other cerebral infarction due to occlusion or stenosis of small artery: Secondary | ICD-10-CM | POA: Diagnosis not present

## 2020-07-13 DIAGNOSIS — Z471 Aftercare following joint replacement surgery: Secondary | ICD-10-CM | POA: Diagnosis not present

## 2020-07-13 DIAGNOSIS — R2689 Other abnormalities of gait and mobility: Secondary | ICD-10-CM | POA: Diagnosis not present

## 2020-07-13 DIAGNOSIS — S72001S Fracture of unspecified part of neck of right femur, sequela: Secondary | ICD-10-CM | POA: Diagnosis not present

## 2020-07-13 DIAGNOSIS — Z419 Encounter for procedure for purposes other than remedying health state, unspecified: Secondary | ICD-10-CM

## 2020-07-13 DIAGNOSIS — I480 Paroxysmal atrial fibrillation: Secondary | ICD-10-CM | POA: Diagnosis not present

## 2020-07-13 DIAGNOSIS — R918 Other nonspecific abnormal finding of lung field: Secondary | ICD-10-CM | POA: Diagnosis not present

## 2020-07-13 DIAGNOSIS — D72829 Elevated white blood cell count, unspecified: Secondary | ICD-10-CM | POA: Diagnosis not present

## 2020-07-13 DIAGNOSIS — S72001A Fracture of unspecified part of neck of right femur, initial encounter for closed fracture: Secondary | ICD-10-CM | POA: Diagnosis not present

## 2020-07-13 DIAGNOSIS — Z96641 Presence of right artificial hip joint: Secondary | ICD-10-CM | POA: Diagnosis not present

## 2020-07-13 DIAGNOSIS — R1312 Dysphagia, oropharyngeal phase: Secondary | ICD-10-CM | POA: Diagnosis not present

## 2020-07-13 HISTORY — PX: HIP ARTHROPLASTY: SHX981

## 2020-07-13 HISTORY — DX: Cardiac arrhythmia, unspecified: I49.9

## 2020-07-13 LAB — CBC WITH DIFFERENTIAL/PLATELET
Abs Immature Granulocytes: 0.23 10*3/uL — ABNORMAL HIGH (ref 0.00–0.07)
Basophils Absolute: 0.1 10*3/uL (ref 0.0–0.1)
Basophils Relative: 0 %
Eosinophils Absolute: 0.3 10*3/uL (ref 0.0–0.5)
Eosinophils Relative: 2 %
HCT: 32.5 % — ABNORMAL LOW (ref 36.0–46.0)
Hemoglobin: 10.4 g/dL — ABNORMAL LOW (ref 12.0–15.0)
Immature Granulocytes: 1 %
Lymphocytes Relative: 12 %
Lymphs Abs: 2.1 10*3/uL (ref 0.7–4.0)
MCH: 30.6 pg (ref 26.0–34.0)
MCHC: 32 g/dL (ref 30.0–36.0)
MCV: 95.6 fL (ref 80.0–100.0)
Monocytes Absolute: 1.7 10*3/uL — ABNORMAL HIGH (ref 0.1–1.0)
Monocytes Relative: 10 %
Neutro Abs: 12.8 10*3/uL — ABNORMAL HIGH (ref 1.7–7.7)
Neutrophils Relative %: 75 %
Platelets: 255 10*3/uL (ref 150–400)
RBC: 3.4 MIL/uL — ABNORMAL LOW (ref 3.87–5.11)
RDW: 13.6 % (ref 11.5–15.5)
WBC: 17.2 10*3/uL — ABNORMAL HIGH (ref 4.0–10.5)
nRBC: 0 % (ref 0.0–0.2)

## 2020-07-13 LAB — BASIC METABOLIC PANEL
Anion gap: 5 (ref 5–15)
BUN: 17 mg/dL (ref 8–23)
CO2: 24 mmol/L (ref 22–32)
Calcium: 8.2 mg/dL — ABNORMAL LOW (ref 8.9–10.3)
Chloride: 109 mmol/L (ref 98–111)
Creatinine, Ser: 0.96 mg/dL (ref 0.44–1.00)
GFR, Estimated: 56 mL/min — ABNORMAL LOW (ref >60)
Glucose, Bld: 160 mg/dL — ABNORMAL HIGH (ref 70–99)
Potassium: 4.7 mmol/L (ref 3.5–5.1)
Sodium: 138 mmol/L (ref 135–145)

## 2020-07-13 LAB — HEMOGLOBIN AND HEMATOCRIT, BLOOD
HCT: 29.4 % — ABNORMAL LOW (ref 36.0–46.0)
Hemoglobin: 9.4 g/dL — ABNORMAL LOW (ref 12.0–15.0)

## 2020-07-13 LAB — RESP PANEL BY RT-PCR (FLU A&B, COVID) ARPGX2
Influenza A by PCR: NEGATIVE
Influenza B by PCR: NEGATIVE
SARS Coronavirus 2 by RT PCR: NEGATIVE

## 2020-07-13 LAB — PROTIME-INR
INR: 1.1 (ref 0.8–1.2)
Prothrombin Time: 14.6 seconds (ref 11.4–15.2)

## 2020-07-13 SURGERY — HEMIARTHROPLASTY, HIP, DIRECT ANTERIOR APPROACH, FOR FRACTURE
Anesthesia: General | Site: Hip | Laterality: Right

## 2020-07-13 MED ORDER — LACTATED RINGERS IV SOLN: INTRAVENOUS | Status: DC | PRN

## 2020-07-13 MED ORDER — LACTATED RINGERS IV SOLN: INTRAVENOUS | Status: DC

## 2020-07-13 MED ORDER — ROCURONIUM BROMIDE 10 MG/ML (PF) SYRINGE
PREFILLED_SYRINGE | INTRAVENOUS | Status: AC
  Filled 2020-07-13: qty 30

## 2020-07-13 MED ORDER — FENTANYL CITRATE (PF) 100 MCG/2ML IJ SOLN: 25.0000 ug | INTRAMUSCULAR | Status: DC | PRN

## 2020-07-13 MED ORDER — LEVETIRACETAM 500 MG PO TABS
500.0000 mg | ORAL_TABLET | Freq: Every morning | ORAL | Status: DC
  Administered 2020-07-14: 500 mg via ORAL
  Filled 2020-07-13: qty 1

## 2020-07-13 MED ORDER — KETOROLAC TROMETHAMINE 30 MG/ML IJ SOLN
INTRAMUSCULAR | Status: AC
  Filled 2020-07-13: qty 1

## 2020-07-13 MED ORDER — ROCURONIUM BROMIDE 100 MG/10ML IV SOLN
INTRAVENOUS | Status: DC | PRN
  Administered 2020-07-13: 40 mg via INTRAVENOUS

## 2020-07-13 MED ORDER — MORPHINE SULFATE (PF) 4 MG/ML IV SOLN
4.0000 mg | INTRAVENOUS | Status: DC | PRN
  Administered 2020-07-13: 4 mg via INTRAVENOUS
  Filled 2020-07-13: qty 1

## 2020-07-13 MED ORDER — PHENYLEPHRINE 40 MCG/ML (10ML) SYRINGE FOR IV PUSH (FOR BLOOD PRESSURE SUPPORT)
PREFILLED_SYRINGE | INTRAVENOUS | Status: AC
  Filled 2020-07-13: qty 20

## 2020-07-13 MED ORDER — MORPHINE SULFATE (PF) 2 MG/ML IV SOLN
0.5000 mg | INTRAVENOUS | Status: DC | PRN
  Administered 2020-07-16: 0.5 mg via INTRAVENOUS
  Filled 2020-07-13: qty 1

## 2020-07-13 MED ORDER — FENTANYL CITRATE (PF) 250 MCG/5ML IJ SOLN
INTRAMUSCULAR | Status: DC | PRN
  Administered 2020-07-13 (×2): 50 ug via INTRAVENOUS

## 2020-07-13 MED ORDER — CEFAZOLIN SODIUM-DEXTROSE 2-3 GM-%(50ML) IV SOLR
INTRAVENOUS | Status: DC | PRN
  Administered 2020-07-13: 2 g via INTRAVENOUS

## 2020-07-13 MED ORDER — BUPIVACAINE-EPINEPHRINE (PF) 0.5% -1:200000 IJ SOLN
INTRAMUSCULAR | Status: DC | PRN
  Administered 2020-07-13: 30 mL via PERINEURAL

## 2020-07-13 MED ORDER — CHLORHEXIDINE GLUCONATE 4 % EX LIQD: 60.0000 mL | Freq: Once | CUTANEOUS | Status: DC

## 2020-07-13 MED ORDER — ORAL CARE MOUTH RINSE: 15.0000 mL | Freq: Once | OROMUCOSAL | Status: AC

## 2020-07-13 MED ORDER — SUGAMMADEX SODIUM 200 MG/2ML IV SOLN
INTRAVENOUS | Status: DC | PRN
  Administered 2020-07-13: 200 mg via INTRAVENOUS

## 2020-07-13 MED ORDER — POVIDONE-IODINE 10 % EX SWAB: 2.0000 "application " | Freq: Once | CUTANEOUS | Status: DC

## 2020-07-13 MED ORDER — SERTRALINE HCL 50 MG PO TABS
50.0000 mg | ORAL_TABLET | Freq: Every day | ORAL | Status: DC
  Administered 2020-07-14 – 2020-07-19 (×6): 50 mg via ORAL
  Filled 2020-07-13 (×6): qty 1

## 2020-07-13 MED ORDER — IRBESARTAN 75 MG PO TABS: 75.0000 mg | ORAL_TABLET | Freq: Every day | ORAL | Status: DC

## 2020-07-13 MED ORDER — PROPOFOL 10 MG/ML IV BOLUS
INTRAVENOUS | Status: AC
  Filled 2020-07-13: qty 20

## 2020-07-13 MED ORDER — LIDOCAINE HCL (CARDIAC) PF 100 MG/5ML IV SOSY
PREFILLED_SYRINGE | INTRAVENOUS | Status: DC | PRN
  Administered 2020-07-13: 60 mg via INTRATRACHEAL

## 2020-07-13 MED ORDER — PANTOPRAZOLE SODIUM 40 MG PO TBEC
40.0000 mg | DELAYED_RELEASE_TABLET | Freq: Every day | ORAL | Status: DC
  Administered 2020-07-14 – 2020-07-19 (×6): 40 mg via ORAL
  Filled 2020-07-13 (×6): qty 1

## 2020-07-13 MED ORDER — SODIUM CHLORIDE 0.9 % IR SOLN
Status: DC | PRN
  Administered 2020-07-13: 200 mL

## 2020-07-13 MED ORDER — ONDANSETRON HCL 4 MG/2ML IJ SOLN
4.0000 mg | Freq: Once | INTRAMUSCULAR | Status: AC
  Administered 2020-07-13: 4 mg via INTRAVENOUS
  Filled 2020-07-13: qty 2

## 2020-07-13 MED ORDER — ENOXAPARIN SODIUM 40 MG/0.4ML IJ SOSY: 40.0000 mg | PREFILLED_SYRINGE | INTRAMUSCULAR | Status: DC

## 2020-07-13 MED ORDER — TETANUS-DIPHTH-ACELL PERTUSSIS 5-2.5-18.5 LF-MCG/0.5 IM SUSY
0.5000 mL | PREFILLED_SYRINGE | Freq: Once | INTRAMUSCULAR | Status: AC
  Administered 2020-07-13: 0.5 mL via INTRAMUSCULAR
  Filled 2020-07-13: qty 0.5

## 2020-07-13 MED ORDER — ONDANSETRON HCL 4 MG/2ML IJ SOLN
INTRAMUSCULAR | Status: DC | PRN
  Administered 2020-07-13: 4 mg via INTRAVENOUS

## 2020-07-13 MED ORDER — BUPIVACAINE-EPINEPHRINE 0.5% -1:200000 IJ SOLN
INTRAMUSCULAR | Status: AC
  Filled 2020-07-13: qty 1

## 2020-07-13 MED ORDER — PHENYLEPHRINE HCL (PRESSORS) 10 MG/ML IV SOLN
INTRAVENOUS | Status: DC | PRN
  Administered 2020-07-13: 80 ug via INTRAVENOUS

## 2020-07-13 MED ORDER — SODIUM CHLORIDE 0.9 % IV BOLUS
1000.0000 mL | Freq: Once | INTRAVENOUS | Status: AC
  Administered 2020-07-13: 1000 mL via INTRAVENOUS

## 2020-07-13 MED ORDER — MELATONIN 5 MG PO TABS
10.0000 mg | ORAL_TABLET | Freq: Every day | ORAL | Status: DC
  Administered 2020-07-17 – 2020-07-18 (×2): 10 mg via ORAL
  Filled 2020-07-13 (×4): qty 2

## 2020-07-13 MED ORDER — FENTANYL CITRATE (PF) 250 MCG/5ML IJ SOLN
INTRAMUSCULAR | Status: AC
  Filled 2020-07-13: qty 5

## 2020-07-13 MED ORDER — SENNOSIDES-DOCUSATE SODIUM 8.6-50 MG PO TABS: 1.0000 | ORAL_TABLET | Freq: Two times a day (BID) | ORAL | Status: DC

## 2020-07-13 MED ORDER — 0.9 % SODIUM CHLORIDE (POUR BTL) OPTIME
TOPICAL | Status: DC | PRN
  Administered 2020-07-13: 1000 mL

## 2020-07-13 MED ORDER — CEFAZOLIN SODIUM-DEXTROSE 2-4 GM/100ML-% IV SOLN: 2.0000 g | INTRAVENOUS | Status: DC

## 2020-07-13 MED ORDER — PHENYLEPHRINE HCL-NACL 10-0.9 MG/250ML-% IV SOLN
INTRAVENOUS | Status: DC | PRN
  Administered 2020-07-13: 40 ug/min via INTRAVENOUS

## 2020-07-13 MED ORDER — OMEPRAZOLE MAGNESIUM 20 MG PO TBEC: 20.0000 mg | DELAYED_RELEASE_TABLET | Freq: Every day | ORAL | Status: DC

## 2020-07-13 MED ORDER — SODIUM CHLORIDE (PF) 0.9 % IJ SOLN
INTRAMUSCULAR | Status: DC | PRN
  Administered 2020-07-13: 30 mL via INTRAVENOUS

## 2020-07-13 MED ORDER — SUCCINYLCHOLINE CHLORIDE 200 MG/10ML IV SOSY
PREFILLED_SYRINGE | INTRAVENOUS | Status: AC
  Filled 2020-07-13: qty 10

## 2020-07-13 MED ORDER — CHLORHEXIDINE GLUCONATE 0.12 % MT SOLN: 15.0000 mL | Freq: Once | OROMUCOSAL | Status: AC

## 2020-07-13 MED ORDER — METOPROLOL TARTRATE 25 MG PO TABS: 25.0000 mg | ORAL_TABLET | Freq: Every day | ORAL | Status: DC

## 2020-07-13 MED ORDER — PROPOFOL 10 MG/ML IV BOLUS
INTRAVENOUS | Status: DC | PRN
  Administered 2020-07-13: 60 mg via INTRAVENOUS

## 2020-07-13 MED ORDER — LIDOCAINE-EPINEPHRINE (PF) 2 %-1:200000 IJ SOLN
20.0000 mL | Freq: Once | INTRAMUSCULAR | Status: AC
  Administered 2020-07-13: 20 mL
  Filled 2020-07-13: qty 20

## 2020-07-13 MED ORDER — CHLORHEXIDINE GLUCONATE 0.12 % MT SOLN
OROMUCOSAL | Status: AC
  Administered 2020-07-13: 15 mL via OROMUCOSAL
  Filled 2020-07-13: qty 15

## 2020-07-13 MED ORDER — HYDROCODONE-ACETAMINOPHEN 5-325 MG PO TABS: 1.0000 | ORAL_TABLET | Freq: Four times a day (QID) | ORAL | Status: DC | PRN

## 2020-07-13 MED ORDER — KETOROLAC TROMETHAMINE 30 MG/ML IJ SOLN
INTRAMUSCULAR | Status: DC | PRN
  Administered 2020-07-13: 30 mg via INTRAVENOUS

## 2020-07-13 MED ORDER — IRRISEPT - 450ML BOTTLE WITH 0.05% CHG IN STERILE WATER, USP 99.95% OPTIME
TOPICAL | Status: DC | PRN
  Administered 2020-07-13: 450 mL

## 2020-07-13 MED ORDER — CEFAZOLIN SODIUM-DEXTROSE 2-4 GM/100ML-% IV SOLN
INTRAVENOUS | Status: AC
  Filled 2020-07-13: qty 100

## 2020-07-13 SURGICAL SUPPLY — 61 items
BLADE CLIPPER SURG (BLADE) IMPLANT
BLADE SAW SGTL 18X1.27X75 (BLADE) ×2 IMPLANT
CHLORAPREP W/TINT 26 (MISCELLANEOUS) ×2 IMPLANT
COVER SURGICAL LIGHT HANDLE (MISCELLANEOUS) ×2 IMPLANT
COVER WAND RF STERILE (DRAPES) ×2 IMPLANT
DERMABOND ADVANCED (GAUZE/BANDAGES/DRESSINGS) ×2
DERMABOND ADVANCED .7 DNX12 (GAUZE/BANDAGES/DRESSINGS) ×2 IMPLANT
DRAPE C-ARM 42X72 X-RAY (DRAPES) ×2 IMPLANT
DRAPE IMP U-DRAPE 54X76 (DRAPES) ×4 IMPLANT
DRAPE STERI IOBAN 125X83 (DRAPES) ×2 IMPLANT
DRAPE U-SHAPE 47X51 STRL (DRAPES) ×6 IMPLANT
DRSG AQUACEL AG ADV 3.5X10 (GAUZE/BANDAGES/DRESSINGS) ×2 IMPLANT
ELECT BLADE 4.0 EZ CLEAN MEGAD (MISCELLANEOUS) ×2
ELECT REM PT RETURN 9FT ADLT (ELECTROSURGICAL) ×2
ELECTRODE BLDE 4.0 EZ CLN MEGD (MISCELLANEOUS) ×1 IMPLANT
ELECTRODE REM PT RTRN 9FT ADLT (ELECTROSURGICAL) ×1 IMPLANT
EVACUATOR 1/8 PVC DRAIN (DRAIN) IMPLANT
GLOVE BIO SURGEON STRL SZ8.5 (GLOVE) ×4 IMPLANT
GLOVE BIOGEL M 7.0 STRL (GLOVE) ×2 IMPLANT
GLOVE BIOGEL PI IND STRL 7.5 (GLOVE) ×1 IMPLANT
GLOVE BIOGEL PI IND STRL 8.5 (GLOVE) ×1 IMPLANT
GLOVE BIOGEL PI INDICATOR 7.5 (GLOVE) ×1
GLOVE BIOGEL PI INDICATOR 8.5 (GLOVE) ×1
GOWN STRL REUS W/ TWL LRG LVL3 (GOWN DISPOSABLE) ×2 IMPLANT
GOWN STRL REUS W/ TWL XL LVL3 (GOWN DISPOSABLE) ×1 IMPLANT
GOWN STRL REUS W/TWL 2XL LVL3 (GOWN DISPOSABLE) ×2 IMPLANT
GOWN STRL REUS W/TWL LRG LVL3 (GOWN DISPOSABLE) ×4
GOWN STRL REUS W/TWL XL LVL3 (GOWN DISPOSABLE) ×2
HANDPIECE INTERPULSE COAX TIP (DISPOSABLE) ×2
HEAD FEM UNIPOLAR 49 OD STRL (Hips) ×2 IMPLANT
HOOD PEEL AWAY FACE SHEILD DIS (HOOD) ×4 IMPLANT
KIT BASIN OR (CUSTOM PROCEDURE TRAY) ×2 IMPLANT
KIT TURNOVER KIT B (KITS) ×2 IMPLANT
MANIFOLD NEPTUNE II (INSTRUMENTS) ×2 IMPLANT
MARKER SKIN DUAL TIP RULER LAB (MISCELLANEOUS) ×2 IMPLANT
NEEDLE SPNL 18GX3.5 QUINCKE PK (NEEDLE) ×2 IMPLANT
NS IRRIG 1000ML POUR BTL (IV SOLUTION) ×2 IMPLANT
PACK TOTAL JOINT (CUSTOM PROCEDURE TRAY) ×2 IMPLANT
PACK UNIVERSAL I (CUSTOM PROCEDURE TRAY) ×2 IMPLANT
PAD ARMBOARD 7.5X6 YLW CONV (MISCELLANEOUS) ×4 IMPLANT
SEALER BIPOLAR AQUA 6.0 (INSTRUMENTS) IMPLANT
SET HNDPC FAN SPRY TIP SCT (DISPOSABLE) ×1 IMPLANT
SPACER FEM TAPERED +0 12/14 (Hips) ×2 IMPLANT
STEM TRI LOC BPS SZ8 W GRIPTON ×1 IMPLANT
SUCTION FRAZIER HANDLE 10FR (MISCELLANEOUS) ×2
SUCTION TUBE FRAZIER 10FR DISP (MISCELLANEOUS) ×1 IMPLANT
SUT ETHIBOND NAB CT1 #1 30IN (SUTURE) ×4 IMPLANT
SUT MNCRL AB 3-0 PS2 18 (SUTURE) ×2 IMPLANT
SUT MON AB 2-0 CT1 36 (SUTURE) ×2 IMPLANT
SUT VIC AB 1 CT1 27 (SUTURE) ×2
SUT VIC AB 1 CT1 27XBRD ANBCTR (SUTURE) ×1 IMPLANT
SUT VIC AB 2-0 CT1 27 (SUTURE) ×2
SUT VIC AB 2-0 CT1 TAPERPNT 27 (SUTURE) ×1 IMPLANT
SUT VLOC 180 0 24IN GS25 (SUTURE) ×2 IMPLANT
SYR 50ML LL SCALE MARK (SYRINGE) ×2 IMPLANT
TOWEL GREEN STERILE (TOWEL DISPOSABLE) ×2 IMPLANT
TOWEL GREEN STERILE FF (TOWEL DISPOSABLE) ×2 IMPLANT
TRAY FOLEY W/BAG SLVR 16FR (SET/KITS/TRAYS/PACK)
TRAY FOLEY W/BAG SLVR 16FR ST (SET/KITS/TRAYS/PACK) IMPLANT
TRI LOC BPS SZ8 W GRIPTON ×2 IMPLANT
WATER STERILE IRR 1000ML POUR (IV SOLUTION) ×6 IMPLANT

## 2020-07-13 NOTE — ED Provider Notes (Signed)
[facility] EMERGENCY DEPARTMENT Provider Note   CSN: [id number] Arrival date & time: 07/13/20  0556     History Chief Complaint  Patient presents with  . Fall  . Head Injury    [name] is a 87 y.o. female.  With history of dementia presents the emergency department after having an unwitnessed fall.  [facility]  No anticoagulation reported, but there is a history of A. fib.  Level 5 caveat due to dementia.  She does complain of headache.        Past Medical History:  Diagnosis Date  . Ankle fracture   . Anxiety   . Arthritis   . Barrett's esophagus   . Chronic atrial fibrillation ([place])    a. Pt previously declined DCCV.  Marland Kitchen Coronary artery disease    a. s/p Cypher DES to LAD in 2006;  b. Last LHC 02/2008:  pLAD 30%, mLAD stent patent, pOM 50%, EF 65%;  c. Lexiscan Myoview 7/14:  Intermediate risk, dist ant and inf-lat ischemia, EF 66% - patient declined cath and elected medical therapy.  . Dementia ([place])   . GERD (gastroesophageal reflux disease)   . Hiatal hernia   . Hx of echocardiogram    Echocardiogram 08/17/12: EF 55-60%, MAC, mild MR, mild LAE, mild RVE, mild to moderate RAE, moderate TR, mildly increased pulmonary artery systolic pressures  . Hyperlipidemia   . Hypertension   . Mitral regurgitation    a. Mod by echo 10/2014.  . Obesity   . Pulmonary hypertension ([place])    a. Mod-severely elevated PA pressures by echo 10/2014.  Marland Kitchen QT prolongation    a. During 10/2014 admit, QTC 539m.  . Tricuspid regurgitation    a. Mod by echo 10/2014.  .Marland KitchenUTI (lower urinary tract infection) 03/2015    Patient Active Problem List   Diagnosis Date Noted  . Pneumothorax on left 06/30/2019  . Lewy body dementia ([place] 05/18/2019  . Recurrent UTI--E coli/Pseudomonas 01/18/2018  . Orthostasis 01/18/2018  . Benign essential HTN   . Cerebral edema ([facility])   . Hypomagnesemia   . Dysphagia, post-stroke   . Lethargy   . Acute  blood loss anemia   . Hypoalbuminemia due to protein-calorie malnutrition ([place]   . History of Barrett's esophagus   . Urinary retention   . Seizure prophylaxis   . E. coli UTI   . [place]ventricular hemorrhage ([place]   . Urinary tract infection without hematuria   . Coronary artery disease involving native coronary artery of native heart without angina pectoris   . Pulmonary HTN ([place]   . Leukocytosis   . Intracranial hemorrhage ([place] 12/18/2017  . Syncope 04/10/2015  . Sepsis secondary to UTI ([place] 04/10/2015  . URI (upper respiratory infection) 04/10/2015  . UTI (lower urinary tract infection) 04/10/2015  . QT prolongation 10/12/2014  . Headache 09/02/2012  . Tricuspid valve regurgitation, moderate 08/18/2012  . Hypokalemia 08/16/2012  . Hyponatremia 08/16/2012  . Atrial fibrillation ([place]   . Hypertension   . Anxiety   . Mixed hyperlipidemia 09/11/2009  . GERD 08/30/2008  . CHEST PAIN 08/30/2008  . Coronary atherosclerosis 04/04/2008  . Syncope and collapse 04/04/2008    Past Surgical History:  Procedure Laterality Date  . ABDOMINAL HYSTERECTOMY    . ANKLE SURGERY  2001  . BREAST EXCISIONAL BIOPSY Right 1970  . BREAST EXCISIONAL BIOPSY Right   . BREAST EXCISIONAL BIOPSY Left   . CESAREAN SECTION    .  CORONARY ANGIOPLASTY    . ESOPHAGOGASTRODUODENOSCOPY       OB History   No obstetric history on file.     Family History  Problem Relation Age of Onset  . Diabetes Mother   . Heart attack Mother   . Lung cancer Father   . Thyroid disease Sister   . Thyroid disease Sister   . Hypertension Sister   . Hypertension Sister   . Hypertension Sister   . Hypertension Sister   . Hypertension Brother   . Hypertension Brother   . Breast cancer Other 45  . Colon cancer Neg Hx   . Colon polyps Neg Hx   . Esophageal cancer Neg Hx   . Stroke Neg Hx     Social History   Tobacco Use  . Smoking status: Former Smoker    Types: Cigarettes  . Smokeless tobacco: Never  Used  Vaping Use  . Vaping Use: Never used  Substance Use Topics  . Alcohol use: No    Alcohol/week: 0.0 standard drinks  . Drug use: No    Home Medications Prior to Admission medications   Medication Sig Start Date End Date Taking? Authorizing Provider  acetaminophen (TYLENOL) 500 MG tablet Take 2 tablets (1,000 mg total) by mouth every 6 (six) hours. 07/03/19   Norm Parcel, PA-C  cephALEXin (KEFLEX) 500 MG capsule Take 1 capsule (500 mg total) by mouth 3 (three) times daily. 06/11/20   Luna Fuse, MD  docusate sodium (COLACE) 100 MG capsule Take 1 capsule (100 mg total) by mouth 2 (two) times daily. 01/18/18   Love, Ivan Anchors, PA-C  irbesartan (AVAPRO) 75 MG tablet Take 75 mg by mouth daily.    [provider]  levETIRAcetam (KEPPRA) 500 MG tablet Take 1 tablet (500 mg total) by mouth 2 (two) times daily. Patient taking differently: Take 500 mg by mouth in the morning.  12/23/17   Costella, Vista Mink, PA-C  lidocaine (LIDODERM) 5 % Place 1 patch onto the skin daily. Remove & Discard patch within 12 hours or as directed by MD 07/02/19   Norm Parcel, PA-C  magnesium oxide (MAG-OX) 400 (241.3 Mg) MG tablet Take 0.5 tablets (200 mg total) by mouth 2 (two) times daily. 01/18/18   Love, Ivan Anchors, PA-C  Melatonin 5 MG TABS Take 5 mg by mouth at bedtime as needed (for sleep/anxiety).     [provider]  Menthol-Methyl Salicylate (MUSCLE RUB) 10-15 % CREA Apply 1 application topically every 6 (six) hours. To bilateral knees 01/18/18   Love, Ivan Anchors, PA-C  metoprolol tartrate (LOPRESSOR) 50 MG tablet Take 1 tablet (50 mg total) by mouth 2 (two) times daily. Patient taking differently: Take 50 mg by mouth daily.  01/18/18   Love, Ivan Anchors, PA-C  mouth rinse LIQD solution 15 mLs by Mouth Rinse route 2 (two) times daily. 01/18/18   Love, Ivan Anchors, PA-C  Multiple Vitamin (MULTIVITAMIN WITH MINERALS) TABS tablet Take 1 tablet by mouth daily. 01/19/18   Love, Ivan Anchors, PA-C   nitroGLYCERIN (NITROSTAT) 0.4 MG SL tablet Place 1 tablet (0.4 mg total) under the tongue every 5 (five) minutes as needed for chest pain (3 doses only). Patient taking differently: Place 0.4 mg under the tongue every 5 (five) minutes x 3 doses as needed for chest pain.  01/18/18   Love, Ivan Anchors, PA-C  omeprazole (PRILOSEC OTC) 20 MG tablet Take 20 mg by mouth daily before breakfast.    [provider]  polyethylene glycol (MIRALAX / GLYCOLAX) packet Take 17 g by mouth daily. 01/19/18   Love, Ivan Anchors, PA-C  potassium chloride SA (K-DUR,KLOR-CON) 20 MEQ tablet Take 20 mEq by mouth daily.     [provider]  ranitidine (ZANTAC) 150 MG/10ML syrup Take 10 mLs (150 mg total) by mouth 2 (two) times daily. 01/18/18   Love, Ivan Anchors, PA-C  sertraline (ZOLOFT) 50 MG tablet Take 50 mg by mouth daily.    [provider]  simethicone (MYLICON) 80 MG chewable tablet Chew 1 tablet (80 mg total) by mouth 4 (four) times daily as needed for flatulence. 01/18/18   Love, Ivan Anchors, PA-C  tamsulosin (FLOMAX) 0.4 MG CAPS capsule Take 1 capsule (0.4 mg total) by mouth daily after supper. 01/18/18   Love, Ivan Anchors, PA-C    Allergies    Amlodipine, Methyldopa, Shellfish-derived products, Tape, Actonel [risedronate sodium], Bactrim [sulfamethoxazole-trimethoprim], Ciprofloxacin hcl, Gas-x [simethicone], Hydralazine hcl, Lactose intolerance (gi), Lisinopril, Milk-related compounds, Other, Oxycodone hcl, Pneumococcal vaccines, Valsartan, Cozaar [losartan], Digitek [digoxin], Felodipine, Hctz [hydrochlorothiazide], Simvastatin, and Spironolactone  Review of Systems   Review of Systems  Unable to perform ROS: Dementia    Physical Exam Updated Vital Signs BP (!) 112/46   Pulse (!) 59   Temp (!) 96.5 F (35.8 C) (Axillary)   Resp 14   SpO2 94%   Physical Exam Vitals and nursing note reviewed.  Constitutional:      Appearance: She is well-developed.  HENT:     Head: Normocephalic. No  raccoon eyes or Battle's sign.     Comments: Large pressure dressing over top of scalp and posterior scalp.  Reportedly large amount of bleeding prior to bandaging.  4cm R posterior scalp laceration.  Moderate oozing.     Right Ear: Tympanic membrane, ear canal and external ear normal. No hemotympanum.     Left Ear: Tympanic membrane, ear canal and external ear normal. No hemotympanum.     Nose: Nose normal.     Mouth/Throat:     Pharynx: Uvula midline.     Comments: Posterior pharynx appears clear. Eyes:     General: Lids are normal.     Extraocular Movements:     Right eye: No nystagmus.     Left eye: No nystagmus.     Conjunctiva/sclera: Conjunctivae normal.     Pupils: Pupils are equal, round, and reactive to light.     Comments: No visible hyphema noted  Neck:     Comments: Immobilized in rigid cervical collar. Cardiovascular:     Rate and Rhythm: Normal rate and regular rhythm.  Pulmonary:     Effort: Pulmonary effort is normal.     Breath sounds: Normal breath sounds.  Abdominal:     Palpations: Abdomen is soft.     Tenderness: There is no abdominal tenderness.  Musculoskeletal:        General: No tenderness.     Cervical back: Neck supple. No bony tenderness.     Thoracic back: No tenderness or bony tenderness.     Lumbar back: No tenderness or bony tenderness.     Comments: There are 1-2 scattered abrasions on bilateral lower extremities.  There are a couple chronic wounds that have bandages.  No obvious deformities.  I am able to actively flex the patient's hips.  She looks generally uncomfortable.  Skin:    General: Skin is warm and dry.  Neurological:     Mental Status: She is alert.     GCS: GCS  eye subscore is 4. GCS verbal subscore is 5. GCS motor subscore is 6.     Cranial Nerves: No cranial nerve deficit.     Sensory: No sensory deficit.     Coordination: Coordination normal.     Deep Tendon Reflexes: Reflexes are normal and symmetric.     Comments: Patient  can follow basic commands such as when I asked her to open her mouth.  She does not give details as to what happened.  She is mainly moaning in bed with her eyes closed.  Occasionally she will say that she has pain but does not vocally localize what hurts.     ED Results / Procedures / Treatments   Labs (all labs ordered are listed, but only abnormal results are displayed) Labs Reviewed  CBC WITH DIFFERENTIAL/PLATELET - Abnormal; Notable for the following components:      Result Value   WBC 17.2 (*)    RBC 3.40 (*)    Hemoglobin 10.4 (*)    HCT 32.5 (*)    Neutro Abs 12.8 (*)    Monocytes Absolute 1.7 (*)    Abs Immature Granulocytes 0.23 (*)    All other components within normal limits  BASIC METABOLIC PANEL  URINALYSIS, ROUTINE W REFLEX MICROSCOPIC  PROTIME-INR  TYPE AND SCREEN    EKG None  Radiology CT Head Wo Contrast  Result Date: 07/13/2020 CLINICAL DATA:  Un witnessed fall.  Head trauma.  Neck trauma. EXAM: CT HEAD WITHOUT CONTRAST CT CERVICAL SPINE WITHOUT CONTRAST TECHNIQUE: Multidetector CT imaging of the head and cervical spine was performed following the standard protocol without intravenous contrast. Multiplanar CT image reconstructions of the cervical spine were also generated. COMPARISON:  03/12/2018 FINDINGS: CT HEAD FINDINGS Brain: No evidence of acute infarction, hemorrhage, hydrocephalus, extra-axial collection or mass lesion/mass effect. Marked prominence of sulci and ventricles compatible with brain atrophy. There is moderate to marked diffuse low-attenuation within the subcortical and periventricular white matter compatible with chronic microvascular disease. Vascular: No hyperdense vessel or unexpected calcification. Skull: Normal. Negative for fracture or focal lesion. Sinuses/Orbits: No acute finding. Other: Large right posterior scalp laceration and small subgaleal hematoma identified. CT CERVICAL SPINE FINDINGS Alignment: Normal alignment of the cervical  spine. Skull base and vertebrae: The vertebral body heights are well preserved. The facet joints all appear well aligned. There is no fracture or dislocation. Soft tissues and spinal canal: No prevertebral fluid or swelling. No visible canal hematoma. Disc levels: There is multilevel disc space narrowing and endplate spurring noted. This is most notable at C2-3 C4-5 and C5-6. Upper chest: Negative. Other: None IMPRESSION: 1. No acute intracranial abnormalities. 2. Chronic small vessel ischemic change and brain atrophy. 3. Large right posterior scalp laceration and small subgaleal hematoma. 4. No evidence for cervical spine fracture. Electronically Signed   By: Kerby Moors M.D.   On: 07/13/2020 07:49   CT Cervical Spine Wo Contrast  Result Date: 07/13/2020 CLINICAL DATA:  Un witnessed fall.  Head trauma.  Neck trauma. EXAM: CT HEAD WITHOUT CONTRAST CT CERVICAL SPINE WITHOUT CONTRAST TECHNIQUE: Multidetector CT imaging of the head and cervical spine was performed following the standard protocol without intravenous contrast. Multiplanar CT image reconstructions of the cervical spine were also generated. COMPARISON:  03/12/2018 FINDINGS: CT HEAD FINDINGS Brain: No evidence of acute infarction, hemorrhage, hydrocephalus, extra-axial collection or mass lesion/mass effect. Marked prominence of sulci and ventricles compatible with brain atrophy. There is moderate to marked diffuse low-attenuation within the subcortical and periventricular white matter  compatible with chronic microvascular disease. Vascular: No hyperdense vessel or unexpected calcification. Skull: Normal. Negative for fracture or focal lesion. Sinuses/Orbits: No acute finding. Other: Large right posterior scalp laceration and small subgaleal hematoma identified. CT CERVICAL SPINE FINDINGS Alignment: Normal alignment of the cervical spine. Skull base and vertebrae: The vertebral body heights are well preserved. The facet joints all appear well aligned.  There is no fracture or dislocation. Soft tissues and spinal canal: No prevertebral fluid or swelling. No visible canal hematoma. Disc levels: There is multilevel disc space narrowing and endplate spurring noted. This is most notable at C2-3 C4-5 and C5-6. Upper chest: Negative. Other: None IMPRESSION: 1. No acute intracranial abnormalities. 2. Chronic small vessel ischemic change and brain atrophy. 3. Large right posterior scalp laceration and small subgaleal hematoma. 4. No evidence for cervical spine fracture. Electronically Signed   By: Kerby Moors M.D.   On: 07/13/2020 07:49    Procedures .Marland KitchenLaceration Repair  Date/Time: 07/13/2020 8:55 AM Performed by: Carlisle Cater, PA-C Authorized by: Carlisle Cater, PA-C   Consent:    Consent obtained:  Verbal   Consent given by:  Healthcare agent   Risks, benefits, and alternatives were discussed: yes     Risks discussed:  Pain   Alternatives discussed:  No treatment Universal protocol:    Patient identity confirmed:  Verbally with patient, provided demographic data and arm band Anesthesia:    Anesthesia method:  Local infiltration   Local anesthetic:  Lidocaine 2% WITH epi Laceration details:    Location:  Scalp   Length (cm):  4 Pre-procedure details:    Preparation:  Patient was prepped and draped in usual sterile fashion and imaging obtained to evaluate for foreign bodies Exploration:    Imaging outcome: foreign body not noted     Wound exploration: wound explored through full range of motion     Contaminated: no   Treatment:    Area cleansed with:  Saline   Amount of cleaning:  Extensive   Debridement:  Minimal   Undermining:  None   Scar revision: no   Skin repair:    Repair method:  Staples   Number of staples:  9 Approximation:    Approximation:  Close Repair type:    Repair type:  Intermediate Post-procedure details:    Dressing:  Open (no dressing)   Procedure completion:  Tolerated well, no immediate  complications Comments:     Approximately 20 minutes needed to clean blood and clear away matted hair and clots, some of which were cut out, to visualize wound and repair.      Medications Ordered in ED Medications  sodium chloride 0.9 % bolus 1,000 mL (1,000 mLs Intravenous New Bag/Given 07/13/20 3149)    ED Course  I have reviewed the triage vital signs and the nursing notes.  Pertinent labs & imaging results that were available during my care of the patient were reviewed by me and considered in my medical decision making (see chart for details).  Patient seen and examined. Upon my arrival to shift --patient reported hypotensive.  IV fluids ordered.  Patient was seen briefly afterwards.  She does not appear to be in shock but does look uncomfortable.  She has a large scalp hematoma which is bandaged prior to my arrival.  Imaging of the head, neck, chest and pelvis ordered.  Will obtain lab work.  No anticoagulants listed on her medication list.  Vital signs reviewed and are as follows: BP 126/73   Pulse Marland Kitchen)  55   Temp (!) 96.5 F (35.8 C) (Axillary)   Resp 12   SpO2 98%   8:05 AM R hip fracture. Normal QT on EKG today. Will need admission. Remainder of labs ordered.   10:44 AM Spoke with Lloyd Huger who has consulted on patient. Plan repair in OR today if cleared by medicine.   Awaiting callback from hospitalist at this time.   10:53 AM Spoke with Dr. Tamala Julian who will admit.     MDM Rules/Calculators/A&P                          Admit.   Final Clinical Impression(s) / ED Diagnoses Final diagnoses:  Closed subcapital fracture of right femur, initial encounter ([place])  Laceration of scalp, initial encounter  Closed head injury, initial encounter  Fall, initial encounter    Rx / DC Orders ED Discharge Orders    None       Carlisle Cater, PA-C 07/13/20 1053    Long, W[facility], MD 07/14/20 [phone]

## 2020-07-13 NOTE — H&P (Addendum)
History and Physical    [name] QVZ:[id number] DOB: December 01, 1926 DOA: 07/13/2020  Referring MD/NP/PA: Carlisle Cater, PA-C PCP: Merrilee Seashore, MD  Patient coming from: Assisted living via EMS  Chief Complaint: Fall  I have personally briefly reviewed patient's old medical records in [place]   HPI: [name] is a 87 y.o. 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  And dementia presents after having a unwitnessed fall.  History is obtained from daughter who is present at bedside as the patient has significant dementia.  Apparently the patient had been cared for at home by her daughter up until this month when it became too difficult for her to care for her at home.  At baseline patient is supposed to use a walker and needs assistance with ambulation, but forgets and tries get up on her own from time to time.  No known history of seizures and is on Keppra for prophylaxis.  Daughter was called around 5 AM and told that mother had fallen and was bleeding from her head.  It is not clear how long patient has been on the floor prior to being found by staff.  She is not on any blood thinners.  Prior to this patient had fallen 3 times May 4, but did not sustain any significant injuries and was not taken to the hospital.  Daughter confirms that the patient is to keep a DO NOT RESUSCITATE CODE STATUS in place.  ED Course: Upon admission to the emergency department patient was seen to be afebrile, pulse 55-64, respirations 12-25, blood pressures lowest 62/42 144/70, O2 saturation maintained on room air.  CT scan of the brain noted a large right posterior scalp laceration with small hematoma and without any intercranial abnormality appreciated.  X-rays revealed an acute right subcapital femoral neck fracture.  Labs significant for WBC 17.2, hemoglobin  10.4, glucose 160, and all other labs relatively within normal limits.  Influenza and COVID-19 screening were negative.  Orthopedics was consulted and plan on taking the patient to surgery if medically clear.  Patient had been given a Tdap booster and he laceration of her scalp was repaired with 9 staples.  Patient also received 1 L normal saline IV fluids and morphine for pain.   Review of Systems  Unable to perform ROS: Dementia  Musculoskeletal: Positive for falls.    Past Medical History:  Diagnosis Date  . Ankle fracture   . Anxiety   . Arthritis   . Barrett's esophagus   . Chronic atrial fibrillation ([place])    a. Pt previously declined DCCV.  Marland Kitchen Coronary artery disease    a. s/p Cypher DES to LAD in 2006;  b. Last LHC 02/2008:  pLAD 30%, mLAD stent patent, pOM 50%, EF 65%;  c. Lexiscan Myoview 7/14:  Intermediate risk, dist ant and inf-lat ischemia, EF 66% - patient declined cath and elected medical therapy.  . Dementia ([place])   . GERD (gastroesophageal reflux disease)   . Hiatal hernia   . Hx of echocardiogram    Echocardiogram 08/17/12: EF 55-60%, MAC, mild MR, mild LAE, mild RVE, mild to moderate RAE, moderate TR, mildly increased pulmonary artery systolic pressures  . Hyperlipidemia   . Hypertension   . Mitral regurgitation    a. Mod by echo 10/2014.  . Obesity   . Pulmonary hypertension ([place])    a. Mod-severely elevated PA pressures by echo 10/2014.  Marland Kitchen  QT prolongation    a. During 10/2014 admit, QTC 543m.  . Tricuspid regurgitation    a. Mod by echo 10/2014.  .Marland KitchenUTI (lower urinary tract infection) 03/2015    Past Surgical History:  Procedure Laterality Date  . ABDOMINAL HYSTERECTOMY    . ANKLE SURGERY  2001  . BREAST EXCISIONAL BIOPSY Right 1970  . BREAST EXCISIONAL BIOPSY Right   . BREAST EXCISIONAL BIOPSY Left   . CESAREAN SECTION    . CORONARY ANGIOPLASTY    . ESOPHAGOGASTRODUODENOSCOPY       reports that she has quit smoking. Her smoking use included cigarettes.  She has never used smokeless tobacco. She reports that she does not drink alcohol and does not use drugs.  Allergies  Allergen Reactions  . Amlodipine Shortness Of Breath, Swelling and Other (See Comments)    Edema in legs  . Methyldopa Shortness Of Breath and Swelling  . Shellfish-Derived Products Other (See Comments)    Syncopal episode  . Tape Itching and Other (See Comments)    Also TEARS THE SKIN, SO PLEASE USE AN ALTERNATIVE!!  .Marland KitchenActonel [Risedronate Sodium] Other (See Comments)    Caused a lump to come up in throat and caused heartburn  . Bactrim [Sulfamethoxazole-Trimethoprim] Itching  . Ciprofloxacin Hcl Other (See Comments)    Confusion   . Gas-X [Simethicone] Itching  . Hydralazine Hcl Other (See Comments)    Unknown reaction, per patient   . Lactose Intolerance (Gi) Diarrhea  . Lisinopril Cough  . Milk-Related Compounds Diarrhea  . Other Other (See Comments)    Orange juice : itching  . Oxycodone Hcl Other (See Comments)    Hallucinations   . Pneumococcal Vaccines Swelling and Other (See Comments)    Arm swells  . Valsartan Other (See Comments)    Unknown reaction, per pt  . Cozaar [Losartan] Rash  . Digitek [Digoxin] Rash  . Felodipine Rash  . Hctz [Hydrochlorothiazide] Itching and Rash    Pt reports causing "itching under the skin"  . Simvastatin Rash  . Spironolactone Itching and Rash    Family History  Problem Relation Age of Onset  . Diabetes Mother   . Heart attack Mother   . Lung cancer Father   . Thyroid disease Sister   . Thyroid disease Sister   . Hypertension Sister   . Hypertension Sister   . Hypertension Sister   . Hypertension Sister   . Hypertension Brother   . Hypertension Brother   . Breast cancer Other 45  . Colon cancer Neg Hx   . Colon polyps Neg Hx   . Esophageal cancer Neg Hx   . Stroke Neg Hx     Prior to Admission medications   Medication Sig Start Date End Date Taking? Authorizing Provider  irbesartan (AVAPRO) 75 MG  tablet Take 75 mg by mouth daily.   Yes [provider]  levETIRAcetam (KEPPRA) 500 MG tablet Take 1 tablet (500 mg total) by mouth 2 (two) times daily. Patient taking differently: Take 500 mg by mouth in the morning. 12/23/17  Yes Costella, VVista Mink PA-C  MAGNESIUM CHLORIDE-CALCIUM PO Take 1 tablet by mouth daily.   Yes [provider]  Melatonin 10 MG TABS Take 10 mg by mouth at bedtime.   Yes [provider]  metoprolol tartrate (LOPRESSOR) 25 MG tablet Take 25 mg by mouth daily.   Yes [provider]  omeprazole (PRILOSEC OTC) 20 MG tablet Take 20 mg by mouth daily.   Yes  [provider]  potassium chloride SA (K-DUR,KLOR-CON) 20 MEQ tablet Take 20 mEq by mouth daily.    Yes [provider]  sertraline (ZOLOFT) 50 MG tablet Take 50 mg by mouth daily.   Yes [provider]  acetaminophen (TYLENOL) 500 MG tablet Take 2 tablets (1,000 mg total) by mouth every 6 (six) hours. Patient not taking: No sig reported 07/03/19   Norm Parcel, PA-C  cephALEXin (KEFLEX) 500 MG capsule Take 1 capsule (500 mg total) by mouth 3 (three) times daily. Patient not taking: No sig reported 06/11/20   Luna Fuse, MD  docusate sodium (COLACE) 100 MG capsule Take 1 capsule (100 mg total) by mouth 2 (two) times daily. Patient not taking: No sig reported 01/18/18   Love, Ivan Anchors, PA-C  lidocaine (LIDODERM) 5 % Place 1 patch onto the skin daily. Remove & Discard patch within 12 hours or as directed by MD Patient not taking: No sig reported 07/02/19   Norm Parcel, PA-C  magnesium oxide (MAG-OX) 400 (241.3 Mg) MG tablet Take 0.5 tablets (200 mg total) by mouth 2 (two) times daily. Patient not taking: No sig reported 01/18/18   Love, Ivan Anchors, PA-C  Menthol-Methyl Salicylate (MUSCLE RUB) 10-15 % CREA Apply 1 application topically every 6 (six) hours. To bilateral knees Patient not taking: No sig reported 01/18/18   Love, Ivan Anchors, PA-C  metoprolol  tartrate (LOPRESSOR) 50 MG tablet Take 1 tablet (50 mg total) by mouth 2 (two) times daily. Patient not taking: No sig reported 01/18/18   Love, Ivan Anchors, PA-C  mouth rinse LIQD solution 15 mLs by Mouth Rinse route 2 (two) times daily. Patient not taking: No sig reported 01/18/18   Love, Ivan Anchors, PA-C  Multiple Vitamin (MULTIVITAMIN WITH MINERALS) TABS tablet Take 1 tablet by mouth daily. Patient not taking: No sig reported 01/19/18   Love, Ivan Anchors, PA-C  nitroGLYCERIN (NITROSTAT) 0.4 MG SL tablet Place 1 tablet (0.4 mg total) under the tongue every 5 (five) minutes as needed for chest pain (3 doses only). Patient not taking: No sig reported 01/18/18   Love, Ivan Anchors, PA-C  polyethylene glycol (MIRALAX / GLYCOLAX) packet Take 17 g by mouth daily. Patient not taking: No sig reported 01/19/18   Love, Ivan Anchors, PA-C  ranitidine (ZANTAC) 150 MG/10ML syrup Take 10 mLs (150 mg total) by mouth 2 (two) times daily. Patient not taking: No sig reported 01/18/18   Love, Ivan Anchors, PA-C  simethicone (MYLICON) 80 MG chewable tablet Chew 1 tablet (80 mg total) by mouth 4 (four) times daily as needed for flatulence. Patient not taking: No sig reported 01/18/18   Love, Ivan Anchors, PA-C  tamsulosin (FLOMAX) 0.4 MG CAPS capsule Take 1 capsule (0.4 mg total) by mouth daily after supper. Patient not taking: No sig reported 01/18/18   Bary Leriche, PA-C    Physical Exam:  Constitutional: Frail elderly female currently in no acute distress Vitals:   07/13/20 0930 07/13/20 0945 07/13/20 1000 07/13/20 1015  BP: (!) 116/55 (!) 115/52 (!) 130/48 (!) 115/44  Pulse: 71 73 69 69  Resp: _0 Temp:      TempSrc:      SpO2: 96% 93% 91%   Weight:      Height:       Eyes: PERRL, lids and conjunctivae normal ENMT: Mucous membranes are dry. Posterior pharynx clear of any exudate or lesions.  Neck: normal, supple, no masses, no thyromegaly Respiratory:  clear to auscultation bilaterally, no wheezing, no crackles.  Normal respiratory effort. No accessory muscle use.  Currently on 4 L nasal cannula oxygen with O2 saturations maintained Cardiovascular: Irregular, no murmurs / rubs / gallops. No extremity edema. 2+ pedal pulses. No carotid bruits.  Abdomen: no tenderness, no masses palpated. No hepatosplenomegaly. Bowel sounds positive.  Musculoskeletal: no clubbing / cyanosis.  Right leg externally rotated and shortened. Skin: laceration to the right posterior scalp currently bandaged. Neurologic: CN 2-12 grossly intact.   Left hemiparesis Psychiatric: Poor memory alert and oriented to self.    Labs on Admission: I have personally reviewed following labs and imaging studies  CBC: Recent Labs  Lab 07/13/20 0616  WBC 17.2*  NEUTROABS 12.8*  HGB 10.4*  HCT 32.5*  MCV 95.6  PLT 563   Basic Metabolic Panel: Recent Labs  Lab 07/13/20 0915  NA 138  K 4.7  CL 109  CO2 24  GLUCOSE 160*  BUN 17  CREATININE 0.96  CALCIUM 8.2*   GFR: Estimated Creatinine Clearance: 35 mL/min (by C-G formula based on SCr of 0.96 mg/dL). Liver Function Tests: No results for input(s): AST, ALT, ALKPHOS, BILITOT, PROT, ALBUMIN in the last 168 hours. No results for input(s): LIPASE, AMYLASE in the last 168 hours. No results for input(s): AMMONIA in the last 168 hours. Coagulation Profile: Recent Labs  Lab 07/13/20 0820  INR 1.1   Cardiac Enzymes: No results for input(s): CKTOTAL, CKMB, CKMBINDEX, TROPONINI in the last 168 hours. BNP (last 3 results) No results for input(s): PROBNP in the last 8760 hours. HbA1C: No results for input(s): HGBA1C in the last 72 hours. CBG: No results for input(s): GLUCAP in the last 168 hours. Lipid Profile: No results for input(s): CHOL, HDL, LDLCALC, TRIG, CHOLHDL, LDLDIRECT in the last 72 hours. Thyroid Function Tests: No results for input(s): TSH, T4TOTAL, FREET4, T3FREE, THYROIDAB in the last 72 hours. Anemia Panel: No results for input(s): VITAMINB12, FOLATE,  FERRITIN, TIBC, IRON, RETICCTPCT in the last 72 hours. Urine analysis:    Component Value Date/Time   COLORURINE YELLOW 06/11/2020 2033   APPEARANCEUR CLOUDY (A) 06/11/2020 2033   LABSPEC 1.017 06/11/2020 2033   PHURINE 5.0 06/11/2020 2033   GLUCOSEU NEGATIVE 06/11/2020 2033   HGBUR SMALL (A) 06/11/2020 2033   BILIRUBINUR NEGATIVE 06/11/2020 2033   KETONESUR NEGATIVE 06/11/2020 2033   PROTEINUR NEGATIVE 06/11/2020 2033   UROBILINOGEN 0.2 08/17/2012 1829   NITRITE POSITIVE (A) 06/11/2020 2033   LEUKOCYTESUR LARGE (A) 06/11/2020 2033   Sepsis Labs: Recent Results (from the past 240 hour(s))  Resp Panel by RT-PCR (Flu A&B, Covid) Nasopharyngeal Swab     Status: None   Collection Time: 07/13/20  8:25 AM   Specimen: Nasopharyngeal Swab; Nasopharyngeal(NP) swabs in vial transport medium  Result Value Ref Range Status   SARS Coronavirus 2 by RT PCR NEGATIVE NEGATIVE Final    Comment: (NOTE) SARS-CoV-2 target nucleic acids are NOT DETECTED.  The SARS-CoV-2 RNA is generally detectable in upper respiratory specimens during the acute phase of infection. The lowest concentration of SARS-CoV-2 viral copies this assay can detect is 138 copies/mL. A negative result does not preclude SARS-Cov-2 infection and should not be used as the sole basis for treatment or other patient management decisions. A negative result may occur with  improper specimen collection/handling, submission of specimen other than nasopharyngeal swab, presence of viral mutation(s) within the areas targeted by this assay, and inadequate number of viral copies(<138 copies/mL). A negative result must be combined with  clinical observations, patient history, and epidemiological information. The expected result is Negative.  Fact Sheet for Patients:  EntrepreneurPulse.com.au  Fact Sheet for Healthcare Providers:  IncredibleEmployment.be  This test is no t yet approved or cleared by the  [place] FDA and  has been authorized for detection and/or diagnosis of SARS-CoV-2 by FDA under an Emergency Use Authorization (EUA). This EUA will remain  in effect (meaning this test can be used) for the duration of the COVID-19 declaration under Section 564(b)(1) of the Act, 21 U.S.C.section 360bbb-3(b)(1), unless the authorization is terminated  or revoked sooner.       Influenza A by PCR NEGATIVE NEGATIVE Final   Influenza B by PCR NEGATIVE NEGATIVE Final    Comment: (NOTE) The Xpert Xpress SARS-CoV-2/FLU/RSV plus assay is intended as an aid in the diagnosis of influenza from Nasopharyngeal swab specimens and should not be used as a sole basis for treatment. Nasal washings and aspirates are unacceptable for Xpert Xpress SARS-CoV-2/FLU/RSV testing.  Fact Sheet for Patients: EntrepreneurPulse.com.au  Fact Sheet for Healthcare Providers: IncredibleEmployment.be  This test is not yet approved or cleared by the [place] FDA and has been authorized for detection and/or diagnosis of SARS-CoV-2 by FDA under an Emergency Use Authorization (EUA). This EUA will remain in effect (meaning this test can be used) for the duration of the COVID-19 declaration under Section 564(b)(1) of the Act, 21 U.S.C. section 360bbb-3(b)(1), unless the authorization is terminated or revoked.  Performed at [facility], [place] [address] [place], [place] [address]      Radiological Exams on Admission: DG Chest 1 View  Result Date: 07/13/2020 CLINICAL DATA:  Unwitnessed fall, dementia. EXAM: CHEST  1 VIEW COMPARISON:  Chest radiograph 07/01/2019 and chest CT from 06/30/2019 FINDINGS: Stable calcified granuloma in the left lung. Old left posterior rib fractures. Lungs are clear without airspace disease or pulmonary edema. Heart size is stable. No large pneumothorax. Atherosclerotic calcifications at the aortic arch. IMPRESSION: No active disease.  Electronically Signed   By: Markus Daft M.D.   On: 07/13/2020 08:22   CT Head Wo Contrast  Result Date: 07/13/2020 CLINICAL DATA:  Un witnessed fall.  Head trauma.  Neck trauma. EXAM: CT HEAD WITHOUT CONTRAST CT CERVICAL SPINE WITHOUT CONTRAST TECHNIQUE: Multidetector CT imaging of the head and cervical spine was performed following the standard protocol without intravenous contrast. Multiplanar CT image reconstructions of the cervical spine were also generated. COMPARISON:  03/12/2018 FINDINGS: CT HEAD FINDINGS Brain: No evidence of acute infarction, hemorrhage, hydrocephalus, extra-axial collection or mass lesion/mass effect. Marked prominence of sulci and ventricles compatible with brain atrophy. There is moderate to marked diffuse low-attenuation within the subcortical and periventricular white matter compatible with chronic microvascular disease. Vascular: No hyperdense vessel or unexpected calcification. Skull: Normal. Negative for fracture or focal lesion. Sinuses/Orbits: No acute finding. Other: Large right posterior scalp laceration and small subgaleal hematoma identified. CT CERVICAL SPINE FINDINGS Alignment: Normal alignment of the cervical spine. Skull base and vertebrae: The vertebral body heights are well preserved. The facet joints all appear well aligned. There is no fracture or dislocation. Soft tissues and spinal canal: No prevertebral fluid or swelling. No visible canal hematoma. Disc levels: There is multilevel disc space narrowing and endplate spurring noted. This is most notable at C2-3 C4-5 and C5-6. Upper chest: Negative. Other: None IMPRESSION: 1. No acute intracranial abnormalities. 2. Chronic small vessel ischemic change and brain atrophy. 3. Large right posterior scalp laceration and small subgaleal hematoma. 4. No  evidence for cervical spine fracture. Electronically Signed   By: Kerby Moors M.D.   On: 07/13/2020 07:49   CT Cervical Spine Wo Contrast  Result Date:  07/13/2020 CLINICAL DATA:  Un witnessed fall.  Head trauma.  Neck trauma. EXAM: CT HEAD WITHOUT CONTRAST CT CERVICAL SPINE WITHOUT CONTRAST TECHNIQUE: Multidetector CT imaging of the head and cervical spine was performed following the standard protocol without intravenous contrast. Multiplanar CT image reconstructions of the cervical spine were also generated. COMPARISON:  03/12/2018 FINDINGS: CT HEAD FINDINGS Brain: No evidence of acute infarction, hemorrhage, hydrocephalus, extra-axial collection or mass lesion/mass effect. Marked prominence of sulci and ventricles compatible with brain atrophy. There is moderate to marked diffuse low-attenuation within the subcortical and periventricular white matter compatible with chronic microvascular disease. Vascular: No hyperdense vessel or unexpected calcification. Skull: Normal. Negative for fracture or focal lesion. Sinuses/Orbits: No acute finding. Other: Large right posterior scalp laceration and small subgaleal hematoma identified. CT CERVICAL SPINE FINDINGS Alignment: Normal alignment of the cervical spine. Skull base and vertebrae: The vertebral body heights are well preserved. The facet joints all appear well aligned. There is no fracture or dislocation. Soft tissues and spinal canal: No prevertebral fluid or swelling. No visible canal hematoma. Disc levels: There is multilevel disc space narrowing and endplate spurring noted. This is most notable at C2-3 C4-5 and C5-6. Upper chest: Negative. Other: None IMPRESSION: 1. No acute intracranial abnormalities. 2. Chronic small vessel ischemic change and brain atrophy. 3. Large right posterior scalp laceration and small subgaleal hematoma. 4. No evidence for cervical spine fracture. Electronically Signed   By: Kerby Moors M.D.   On: 07/13/2020 07:49   DG HIP UNILAT WITH PELVIS 2-3 VIEWS RIGHT  Result Date: 07/13/2020 CLINICAL DATA:  Fall, hip pain, dementia EXAM: DG HIP (WITH OR WITHOUT PELVIS) 2-3V RIGHT  COMPARISON:  None. FINDINGS: There is an acute displaced and angulated right hip subcapital femoral neck fracture. Femoral head remains located. Bones are osteopenic. Degenerative changes and scoliosis of lower lumbar spine. Nonobstructive bowel gas pattern. IMPRESSION: Acute right hip subcapital femoral neck fracture. Osteopenia Electronically Signed   By: Jerilynn Mages.  Shick M.D.   On: 07/13/2020 08:23    EKG: Independently reviewed.  Sinus arrhythmia at 57 bpm with first-degree heart block  Assessment/Plan Right hip fracture and right posterior scalp laceration secondary to fall: Acute.  Patient had an unwitnessed fall at the assisted living facility.  Imaging studies noted a right subcapital femoral neck fracture and right scalp laceration with hematoma.  Laceration was repaired with staples by the ED provider.  Orthopedic was consulted.  Geriatric sensitive perioperative cardiac risk index probability of perioperative myocardial infarction or cardiac arrest is 10.1%.  Daughter is aware of the risk and would like to proceed with surgery.  Orthopedics plan for surgery today around 5 PM. -Admit to a surgical telemetry bed -Hip fracture order set utilized -N.p.o. -Hydrocodone/morphine IV as needed pain -Appreciate orthopedic surgery consultative services, we will follow-up for any further recommendation  Leukocytosis: WBC elevated at 17.2.  Suspect secondary to acute fracture. -Follow-up urinalysis  Transient hypotension: On admission patient's initial blood pressures were as low as 62/40, but improved after IV fluids.  Home blood pressure medications include metoprolol tartrate 25 mg daily and irbesartan 75 mg daily. -Held home blood pressure medication -Goal MAP greater than 65 -Reassess in a.m. and determine if needing to restart  Acute blood loss anemia: Acute.  Hemoglobin 10.4 g/dL which is lower than previous of 13.2 g/dL  on 4/25.  Suspect acute blood loss secondary to scalp laceration. -Continue  to monitor and transfuse blood products if needed  Hypoxia: Acute.  Patient O2 saturation dropped down to 84% after getting pain medications.  Suspect iatrogenic in nature. -Continuous pulse oximetry with nasal cannula oxygen maintain O2 saturations greater than 92% -Incentive spirometry  Paroxysmal atrial fibrillation: Patient appears to be in atrial fibrillation, but is rate controlled.  Not on anticoagulation due to history of intracranial hemorrhage. 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 -Continue Keppra for seizure prophylaxis   GERD -Continue p.o. Pepcid  Dementia with behavioral disturbance -Set bed alarm on -Delirium precaution  DNR: Present on admission DVT prophylaxis: Lovenox Code Status: DNR Family Communication: Daughter updated at bedside Disposition Plan: Patient would likely need skilled nursing facility placement Consults called: Orthopedic surgery Admission status: Inpatient, require more than 2 midnight stay  Norval Morton MD [facility]   If 7PM-7AM, please contact night-coverage   07/13/2020, 10:52 AM

## 2020-07-13 NOTE — Anesthesia Procedure Notes (Signed)
Procedure Name: Intubation Date/Time: 07/13/2020 5:54 PM Performed by: Clovis Cao, CRNA Pre-anesthesia Checklist: Patient identified, Suction available, Emergency Drugs available, Patient being monitored and Timeout performed Patient Re-evaluated:Patient Re-evaluated prior to induction Oxygen Delivery Method: Circle system utilized Induction Type: IV induction Ventilation: Mask ventilation without difficulty Laryngoscope Size: Miller Grade View: Grade I Tube type: Oral Tube size: 7.0 mm Number of attempts: 1 Airway Equipment and Method: Stylet Placement Confirmation: ETT inserted through vocal cords under direct vision,  positive ETCO2 and breath sounds checked- equal and bilateral Secured at: 22 cm Tube secured with: Tape Dental Injury: Teeth and Oropharynx as per pre-operative assessment

## 2020-07-13 NOTE — ED Notes (Addendum)
Placed pt on 6L 02 per [place] 

## 2020-07-13 NOTE — ED Notes (Signed)
Pt Sa02 100% so I decreased pt to 4L 02 per [place]

## 2020-07-13 NOTE — Op Note (Signed)
OPERATIVE REPORT  SURGEON: Rod Can, MD   ASSISTANT: Wandra Arthurs, RNFA  PREOPERATIVE DIAGNOSIS: Displaced Right femoral neck fracture.   POSTOPERATIVE DIAGNOSIS: Displaced Right femoral neck fracture.   PROCEDURE: Right hip hemiarthroplasty, anterior approach.   IMPLANTS: DePuy Tri Lock stem, size 8, hi offset, with a +0 mm spacer and a 49 mm monopolar head ball.  ANESTHESIA:  General  ANTIBIOTICS: 2g ancef.  ESTIMATED BLOOD LOSS:-100 mL    DRAINS: None.  COMPLICATIONS: None   CONDITION: PACU - hemodynamically stable.   BRIEF CLINICAL NOTE: [name] is a 87 y.o. female with a displaced Right femoral neck fracture. The patient was admitted to the hospitalist service and underwent perioperative risk stratification and medical optimization. The risks, benefits, and alternatives to hemiarthroplasty were explained, and the patient elected to proceed.  PROCEDURE IN DETAIL: The patient was taken to the operating room and general anesthesia was induced on the hospital bed.  The patient was then positioned on the Hana table.  All bony prominences were well padded.  The hip was prepped and draped in the normal sterile surgical fashion.  A time-out was called verifying side and site of surgery. Antibiotics were given within 60 minutes of beginning the procedure.   Bikini incision was made, and te direct anterior approach to the hip was performed through the Hueter interval.  Lateral femoral circumflex vessels were treated with the Auqumantys. The anterior capsule was exposed and an inverted T capsulotomy was made.  Fracture hematoma was encountered and evacuated. The patient was found to have a comminuted Right subcapital femoral neck fracture.  I freshened the femoral neck cut with a saw.  I removed the femoral neck fragment.  A corkscrew was placed into the head and the head was removed.  This was passed to the back table and was measured. The pubofemoral ligament was released  subperiosteally to the lesser trochanter.   Acetabular exposure was achieved.  I examined the articular cartilage which was intact.  The labrum was intact. A 49 mm trial head was placed and found to have excellent fit.   I then gained femoral exposure taking care to protect the abductors and greater trochanter.  This was performed using standard external rotation, extension, and adduction.  The superior capsule was incised, taking care to stay lateral to the posterior border of the femoral neck. A cookie cutter was used to enter the femoral canal, and then the femoral canal finder was used to confirm location.  I then sequentially broached up to a size 8.  Calcar planer was used on the femoral neck remnant.  I paced a hi neck and a 36 + 1.5 head ball. The hip was reduced.  Leg lengths were checked fluoroscopically.  The hip was dislocated and trial components were removed.  I placed the real stem followed by the real spacer and head ball.  A single reduction maneuver was performed and the hip was reduced.  Fluoroscopy was used to confirm component position and leg lengths.  At 90 degrees of external rotation and extension, the hip was stable to an anterior directed force.   The wound was copiously irrigated with Irrisept solution and normal saline using pule lavage.  Marcaine solution was injected into the periarticular soft tissue.  The wound was closed in layers using #1 Vicryl and V-Loc for the fascia, 2-0 Vicryl for the subcutaneous fat, 2-0 Monocryl for the deep dermal layer, and staples + glue for the skin.  Once the glue was  fully dried, an Aquacell Ag dressing was applied.  The patient was then awakened from anesthesia and transported to the recovery room in stable condition.  Sponge, needle, and instrument counts were correct at the end of the case x2.  The patient tolerated the procedure well and there were no known complications.  Please note that a surgical assistant was a medical necessity for  this procedure to perform it in a safe and expeditious manner. Assistant was necessary to provide appropriate retraction of vital neurovascular structures, to prevent femoral fracture, and to allow for anatomic placement of the prosthesis.

## 2020-07-13 NOTE — Consult Note (Addendum)
Reason for Consult:Right hip fx Referring Physician: J Long Time called: 9407 Time at bedside: Cozad is an 87 y.o. female.  HPI: [name] fell at the facility where she resides. It was unwitnessed. She was brought to the ED where workup revealed a right hip fx and orthopedic surgery was consulted. She is severely demented and cannot contribute to history. Daughter at bedside and reports pt walks but very little. 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  Past Medical History:  Diagnosis Date  . Ankle fracture   . Anxiety   . Arthritis   . Barrett's esophagus   . Chronic atrial fibrillation ([place])    a. Pt previously declined DCCV.  Marland Kitchen Coronary artery disease    a. s/p Cypher DES to LAD in 2006;  b. Last LHC 02/2008:  pLAD 30%, mLAD stent patent, pOM 50%, EF 65%;  c. Lexiscan Myoview 7/14:  Intermediate risk, dist ant and inf-lat ischemia, EF 66% - patient declined cath and elected medical therapy.  . Dementia ([place])   . GERD (gastroesophageal reflux disease)   . Hiatal hernia   . Hx of echocardiogram    Echocardiogram 08/17/12: EF 55-60%, MAC, mild MR, mild LAE, mild RVE, mild to moderate RAE, moderate TR, mildly increased pulmonary artery systolic pressures  . Hyperlipidemia   . Hypertension   . Mitral regurgitation    a. Mod by echo 10/2014.  . Obesity   . Pulmonary hypertension ([place])    a. Mod-severely elevated PA pressures by echo 10/2014.  Marland Kitchen QT prolongation    a. During 10/2014 admit, QTC 550m.  . Tricuspid regurgitation    a. Mod by echo 10/2014.  .Marland KitchenUTI (lower urinary tract infection) 03/2015    Past Surgical History:  Procedure Laterality Date  . ABDOMINAL HYSTERECTOMY    . ANKLE SURGERY  2001  . BREAST EXCISIONAL BIOPSY Right 1970  . BREAST EXCISIONAL BIOPSY Right   . BREAST EXCISIONAL BIOPSY Left   . CESAREAN SECTION    . CORONARY ANGIOPLASTY    . ESOPHAGOGASTRODUODENOSCOPY      Family History  Problem Relation Age of Onset  . Diabetes Mother    . Heart attack Mother   . Lung cancer Father   . Thyroid disease Sister   . Thyroid disease Sister   . Hypertension Sister   . Hypertension Sister   . Hypertension Sister   . Hypertension Sister   . Hypertension Brother   . Hypertension Brother   . Breast cancer Other 45  . Colon cancer Neg Hx   . Colon polyps Neg Hx   . Esophageal cancer Neg Hx   . Stroke Neg Hx     Social History:  reports that she has quit smoking. Her smoking use included cigarettes. She has never used smokeless tobacco. She reports that she does not drink alcohol and does not use drugs.  Allergies:  Allergies  Allergen Reactions  . Amlodipine Shortness Of Breath, Swelling and Other (See Comments)    Edema in legs  . Methyldopa Shortness Of Breath and Swelling  . Shellfish-Derived Products Other (See Comments)    Syncopal episode  . Tape Itching and Other (See Comments)    Also TEARS THE SKIN, SO PLEASE USE AN ALTERNATIVE!!  .Marland KitchenActonel [Risedronate Sodium] Other (See Comments)    Caused a lump to come up in throat and caused heartburn  . Bactrim [Sulfamethoxazole-Trimethoprim] Itching  . Ciprofloxacin Hcl Other (See Comments)  Confusion   . Gas-X [Simethicone] Itching  . Hydralazine Hcl Other (See Comments)    Unknown reaction, per patient   . Lactose Intolerance (Gi) Diarrhea  . Lisinopril Cough  . Milk-Related Compounds Diarrhea  . Other Other (See Comments)    Orange juice : itching  . Oxycodone Hcl Other (See Comments)    Hallucinations   . Pneumococcal Vaccines Swelling and Other (See Comments)    Arm swells  . Valsartan Other (See Comments)    Unknown reaction, per pt  . Cozaar [Losartan] Rash  . Digitek [Digoxin] Rash  . Felodipine Rash  . Hctz [Hydrochlorothiazide] Itching and Rash    Pt reports causing "itching under the skin"  . Simvastatin Rash  . Spironolactone Itching and Rash    Medications: I have reviewed the patient's current medications.  Results for orders placed  or performed during the hospital encounter of 07/13/20 (from the past 48 hour(s))  CBC with Differential     Status: Abnormal   Collection Time: 07/13/20  6:16 AM  Result Value Ref Range   WBC 17.2 (H) 4.0 - 10.5 K/uL   RBC 3.40 (L) 3.87 - 5.11 MIL/uL   Hemoglobin 10.4 (L) 12.0 - 15.0 g/dL   HCT 32.5 (L) 36.0 - 46.0 %   MCV 95.6 80.0 - 100.0 fL   MCH 30.6 26.0 - 34.0 pg   MCHC 32.0 30.0 - 36.0 g/dL   RDW 13.6 11.5 - 15.5 %   Platelets 255 150 - 400 K/uL   nRBC 0.0 0.0 - 0.2 %   Neutrophils Relative % 75 %   Neutro Abs 12.8 (H) 1.7 - 7.7 K/uL   Lymphocytes Relative 12 %   Lymphs Abs 2.1 0.7 - 4.0 K/uL   Monocytes Relative 10 %   Monocytes Absolute 1.7 (H) 0.1 - 1.0 K/uL   Eosinophils Relative 2 %   Eosinophils Absolute 0.3 0.0 - 0.5 K/uL   Basophils Relative 0 %   Basophils Absolute 0.1 0.0 - 0.1 K/uL   Immature Granulocytes 1 %   Abs Immature Granulocytes 0.23 (H) 0.00 - 0.07 K/uL    Comment: Performed at [facility], [address] [place], [place] [address]  Type and screen [place]     Status: None (Preliminary result)   Collection Time: 07/13/20  8:09 AM  Result Value Ref Range   ABO/RH(D) PENDING    Antibody Screen PENDING    Sample Expiration      07/16/2020,2359 Performed at [facility], [place] [address], [place], [place] [address]   Protime-INR     Status: None   Collection Time: 07/13/20  8:20 AM  Result Value Ref Range   Prothrombin Time 14.6 11.4 - 15.2 seconds   INR 1.1 0.8 - 1.2    Comment: (NOTE) INR goal varies based on device and disease states. Performed at [facility], [place] [address] [place], [place] [address]     DG Chest 1 View  Result Date: 07/13/2020 CLINICAL DATA:  Unwitnessed fall, dementia. EXAM: CHEST  1 VIEW COMPARISON:  Chest radiograph 07/01/2019 and chest CT from 06/30/2019 FINDINGS: Stable calcified granuloma in the left lung. Old left posterior rib fractures. Lungs are clear without airspace  disease or pulmonary edema. Heart size is stable. No large pneumothorax. Atherosclerotic calcifications at the aortic arch. IMPRESSION: No active disease. Electronically Signed   By: Markus Daft M.D.   On: 07/13/2020 08:22   CT Head Wo Contrast  Result Date: 07/13/2020 CLINICAL DATA:  Un witnessed fall.  Head trauma.  Neck trauma. EXAM: CT HEAD WITHOUT CONTRAST CT CERVICAL SPINE WITHOUT CONTRAST TECHNIQUE: Multidetector CT imaging of the head and cervical spine was performed following the standard protocol without intravenous contrast. Multiplanar CT image reconstructions of the cervical spine were also generated. COMPARISON:  03/12/2018 FINDINGS: CT HEAD FINDINGS Brain: No evidence of acute infarction, hemorrhage, hydrocephalus, extra-axial collection or mass lesion/mass effect. Marked prominence of sulci and ventricles compatible with brain atrophy. There is moderate to marked diffuse low-attenuation within the subcortical and periventricular white matter compatible with chronic microvascular disease. Vascular: No hyperdense vessel or unexpected calcification. Skull: Normal. Negative for fracture or focal lesion. Sinuses/Orbits: No acute finding. Other: Large right posterior scalp laceration and small subgaleal hematoma identified. CT CERVICAL SPINE FINDINGS Alignment: Normal alignment of the cervical spine. Skull base and vertebrae: The vertebral body heights are well preserved. The facet joints all appear well aligned. There is no fracture or dislocation. Soft tissues and spinal canal: No prevertebral fluid or swelling. No visible canal hematoma. Disc levels: There is multilevel disc space narrowing and endplate spurring noted. This is most notable at C2-3 C4-5 and C5-6. Upper chest: Negative. Other: None IMPRESSION: 1. No acute intracranial abnormalities. 2. Chronic small vessel ischemic change and brain atrophy. 3. Large right posterior scalp laceration and small subgaleal hematoma. 4. No evidence for  cervical spine fracture. Electronically Signed   By: Kerby Moors M.D.   On: 07/13/2020 07:49   CT Cervical Spine Wo Contrast  Result Date: 07/13/2020 CLINICAL DATA:  Un witnessed fall.  Head trauma.  Neck trauma. EXAM: CT HEAD WITHOUT CONTRAST CT CERVICAL SPINE WITHOUT CONTRAST TECHNIQUE: Multidetector CT imaging of the head and cervical spine was performed following the standard protocol without intravenous contrast. Multiplanar CT image reconstructions of the cervical spine were also generated. COMPARISON:  03/12/2018 FINDINGS: CT HEAD FINDINGS Brain: No evidence of acute infarction, hemorrhage, hydrocephalus, extra-axial collection or mass lesion/mass effect. Marked prominence of sulci and ventricles compatible with brain atrophy. There is moderate to marked diffuse low-attenuation within the subcortical and periventricular white matter compatible with chronic microvascular disease. Vascular: No hyperdense vessel or unexpected calcification. Skull: Normal. Negative for fracture or focal lesion. Sinuses/Orbits: No acute finding. Other: Large right posterior scalp laceration and small subgaleal hematoma identified. CT CERVICAL SPINE FINDINGS Alignment: Normal alignment of the cervical spine. Skull base and vertebrae: The vertebral body heights are well preserved. The facet joints all appear well aligned. There is no fracture or dislocation. Soft tissues and spinal canal: No prevertebral fluid or swelling. No visible canal hematoma. Disc levels: There is multilevel disc space narrowing and endplate spurring noted. This is most notable at C2-3 C4-5 and C5-6. Upper chest: Negative. Other: None IMPRESSION: 1. No acute intracranial abnormalities. 2. Chronic small vessel ischemic change and brain atrophy. 3. Large right posterior scalp laceration and small subgaleal hematoma. 4. No evidence for cervical spine fracture. Electronically Signed   By: Kerby Moors M.D.   On: 07/13/2020 07:49   DG HIP UNILAT WITH  PELVIS 2-3 VIEWS RIGHT  Result Date: 07/13/2020 CLINICAL DATA:  Fall, hip pain, dementia EXAM: DG HIP (WITH OR WITHOUT PELVIS) 2-3V RIGHT COMPARISON:  None. FINDINGS: There is an acute displaced and angulated right hip subcapital femoral neck fracture. Femoral head remains located. Bones are osteopenic. Degenerative changes and scoliosis of lower lumbar spine. Nonobstructive bowel gas pattern. IMPRESSION: Acute right hip subcapital femoral neck fracture. Osteopenia Electronically Signed  By: Eugenie Filler M.D.   On: 07/13/2020 08:23    Review of Systems  Unable to perform ROS: Dementia   Blood pressure (!) 114/56, pulse 76, temperature (!) 96.5 F (35.8 C), temperature source Axillary, resp. rate 19, height _0  (1.676 m), weight 61.2 kg, SpO2 95 %. Physical Exam Constitutional:      General: She is not in acute distress.    Appearance: She is well-developed. She is not diaphoretic.  HENT:     Head: Normocephalic and atraumatic.  Eyes:     General: No scleral icterus.       Right eye: No discharge.        Left eye: No discharge.     Conjunctiva/sclera: Conjunctivae normal.  Cardiovascular:     Rate and Rhythm: Normal rate and regular rhythm.  Pulmonary:     Effort: Pulmonary effort is normal. No respiratory distress.  Musculoskeletal:     Cervical back: Normal range of motion.     Comments: RLE No traumatic wounds, ecchymosis, or rash  TTP hip  No knee or ankle effusion  Knee stable to varus/ valgus and anterior/posterior stress  Sens DPN, SPN, TN could not assess  Motor EHL, ext, flex, evers could not assess  DP 1+, PT 0, No significant edema  Skin:    General: Skin is warm and dry.  Neurological:     Mental Status: She is alert.  Psychiatric:        Behavior: Behavior normal.     Assessment/Plan: Left hip fx -- Plan hip hemi today at 5 if cleared by medicine. Please keep NPO.    Lisette Abu, PA-C Orthopedic Surgery [phone] 07/13/2020, 9:21 AM

## 2020-07-13 NOTE — Anesthesia Postprocedure Evaluation (Signed)
Anesthesia Post Note  Patient: [name]  Procedure(s) Performed: ARTHROPLASTY BIPOLAR HIP (HEMIARTHROPLASTY) (Right Hip)     Patient location during evaluation: PACU Anesthesia Type: General Level of consciousness: awake and alert Pain management: pain level controlled Vital Signs Assessment: post-procedure vital signs reviewed and stable Respiratory status: spontaneous breathing, nonlabored ventilation and respiratory function stable Cardiovascular status: blood pressure returned to baseline and stable Postop Assessment: no apparent nausea or vomiting Anesthetic complications: no   No complications documented.  Last Vitals:  Vitals:   07/13/20 1930 07/13/20 1945  BP: (!) 113/46   Pulse: 74 73  Resp: 15 12  Temp: (!) 36.4 C   SpO2: 94% 99%    Last Pain:  Vitals:   07/13/20 1544  TempSrc: Oral  PainSc:                  Audry Pili

## 2020-07-13 NOTE — Anesthesia Preprocedure Evaluation (Addendum)
Anesthesia Evaluation  Patient identified by MRN, date of birth, ID band Patient confused    Reviewed: Allergy & Precautions, NPO status , Patient's Chart, lab work & pertinent test results  Airway Mallampati: II  TM Distance: <3 FB Neck ROM: Limited    Dental  (+) Edentulous Upper, Edentulous Lower   Pulmonary neg pulmonary ROS, former smoker,    Pulmonary exam normal        Cardiovascular hypertension, Pt. on medications and Pt. on home beta blockers + CAD and + Cardiac Stents  + dysrhythmias (not on anticoagulation 2/2 intracranial hemorrhages and falls) Atrial Fibrillation  Rhythm:Irregular Rate:Normal     Neuro/Psych  Headaches, Anxiety Depression Dementia CVA (left sided weakness), Residual Symptoms    GI/Hepatic Neg liver ROS, hiatal hernia, GERD  Medicated,  Endo/Other  negative endocrine ROS  Renal/GU negative Renal ROS     Musculoskeletal  (+) Arthritis , Osteoarthritis,  Right hip fx   Abdominal (+)  Abdomen: soft.    Peds  Hematology  (+) anemia ,   Anesthesia Other Findings   Reproductive/Obstetrics                          Anesthesia Physical Anesthesia Plan  ASA: III  Anesthesia Plan: General   Post-op Pain Management:    Induction: Intravenous  PONV Risk Score and Plan: 3 and Ondansetron and Treatment may vary due to age or medical condition  Airway Management Planned: Mask and Oral ETT  Additional Equipment: None  Intra-op Plan:   Post-operative Plan: Possible Post-op intubation/ventilation  Informed Consent: I have reviewed the patients History and Physical, chart, labs and discussed the procedure including the risks, benefits and alternatives for the proposed anesthesia with the patient or authorized representative who has indicated his/her understanding and acceptance.   Patient has DNR.  Discussed DNR with power of attorney and Suspend DNR.   Dental  advisory given  Plan Discussed with: CRNA  Anesthesia Plan Comments: ( Lab Results      Component                Value               Date                      WBC                      17.2 (H)            07/13/2020                HGB                      9.4 (L)             07/13/2020                HCT                      29.4 (L)            07/13/2020                MCV                      95.6                07/13/2020  PLT                      255                 07/13/2020           Lab Results      Component                Value               Date                      NA                       138                 07/13/2020                K                        4.7                 07/13/2020                CO2                      24                  07/13/2020                GLUCOSE                  160 (H)             07/13/2020                BUN                      17                  07/13/2020                CREATININE               0.96                07/13/2020                CALCIUM                  8.2 (L)             07/13/2020                GFRNONAA                 56 (L)              07/13/2020                GFRAA                    >60                 07/01/2019          )       Anesthesia Quick Evaluation

## 2020-07-13 NOTE — ED Triage Notes (Signed)
Pt arrived via ems due to a unwitnessed fall at her [employer]. EMS reports pt is not on blood thinners, Pt has a obvious head injury with bleeding through gauze. Pt is at baseline which is axox1 (alert to self). EMS reports vitals have been stable. Pt has a hx of dementia. EMS reports pt had 50 ccs of blood next to her.

## 2020-07-13 NOTE — ED Notes (Signed)
Laceration on right side of head was cleaned and new dressing applied with chuck underneath head.

## 2020-07-13 NOTE — ED Notes (Signed)
Suture cart at bedside 

## 2020-07-13 NOTE — Discharge Instructions (Signed)
 Dr. Capitola Ladson Joint Replacement Specialist [facility] [address] [place], [place] [address] [phone]   TOTAL HIP REPLACEMENT POSTOPERATIVE DIRECTIONS    Hip Rehabilitation, Guidelines Following Surgery   WEIGHT BEARING Weight bearing as tolerated with assist device (walker, cane, etc) as directed, use it as long as suggested by your surgeon or therapist, typically at least 4-6 weeks.  The results of a hip operation are greatly improved after range of motion and muscle strengthening exercises. Follow all safety measures which are given to protect your hip. If any of these exercises cause increased pain or swelling in your joint, decrease the amount until you are comfortable again. Then slowly increase the exercises. Call your caregiver if you have problems or questions.   HOME CARE INSTRUCTIONS  Most of the following instructions are designed to prevent the dislocation of your new hip.  . Remove items at home which could result in a fall. This includes throw rugs or furniture in walking pathways.  . Continue medications as instructed at time of discharge.  You may have some home medications which will be placed on hold until you complete the course of blood thinner medication.  You may start showering once you are discharged home. Do not remove your dressing. . Do not put on socks or shoes without following the instructions of your caregivers.   . Sit on chairs with arms. Use the chair arms to help push yourself up when arising.  . Arrange for the use of a toilet seat elevator so you are not sitting low.   Walk with walker as instructed.  . You may resume a sexual relationship in one month or when given the OK by your caregiver.  . Use walker as long as suggested by your caregivers.  . You may put full weight on your legs and walk as much as is comfortable. . Avoid periods of inactivity such as sitting longer than an hour when not asleep.  This helps prevent blood clots.  . You may return to work once you are cleared by your surgeon.  . Do not drive a car for 6 weeks or until released by your surgeon.  . Do not drive while taking narcotics.  . Wear elastic stockings for two weeks following surgery during the day but you may remove then at night.  . Make sure you keep all of your appointments after your operation with all of your doctors and caregivers. You should call the office at the above phone number and make an appointment for approximately two weeks after the date of your surgery. . Please pick up a stool softener and laxative for home use as long as you are requiring pain medications.  ICE to the affected hip every three hours for 30 minutes at a time and then as needed for pain and swelling. Continue to use ice on the hip for pain and swelling from surgery. You may notice swelling that will progress down to the foot and ankle.  This is normal after surgery.  Elevate the leg when you are not up walking on it.   . It is important for you to complete the blood thinner medication as prescribed by your doctor.  Continue to use the breathing machine which will help keep your temperature down.  It is common for your temperature to cycle up and down following surgery, especially at night when you are not up moving around and exerting yourself.  The breathing machine keeps your   lungs expanded and your temperature down.  RANGE OF MOTION AND STRENGTHENING EXERCISES  These exercises are designed to help you keep full movement of your hip joint. Follow your caregiver's or physical therapist's instructions. Perform all exercises about fifteen times, three times per day or as directed. Exercise both hips, even if you have had only one joint replacement. These exercises can be done on a training (exercise) mat, on the floor, on a table or on a bed. Use whatever works the best and is most comfortable for you. Use music or television while you are  exercising so that the exercises are a pleasant break in your day. This will make your life better with the exercises acting as a break in routine you can look forward to.  . Lying on your back, slowly slide your foot toward your buttocks, raising your knee up off the floor. Then slowly slide your foot back down until your leg is straight again.  . Lying on your back spread your legs as far apart as you can without causing discomfort.  . Lying on your side, raise your upper leg and foot straight up from the floor as far as is comfortable. Slowly lower the leg and repeat.  . Lying on your back, tighten up the muscle in the front of your thigh (quadriceps muscles). You can do this by keeping your leg straight and trying to raise your heel off the floor. This helps strengthen the largest muscle supporting your knee.  . Lying on your back, tighten up the muscles of your buttocks both with the legs straight and with the knee bent at a comfortable angle while keeping your heel on the floor.   SKILLED REHAB INSTRUCTIONS: If the patient is transferred to a skilled rehab facility following release from the hospital, a list of the current medications will be sent to the facility for the patient to continue.  When discharged from the skilled rehab facility, please have the facility set up the patient's Home Health Physical Therapy prior to being released. Also, the skilled facility will be responsible for providing the patient with their medications at time of release from the facility to include their pain medication and their blood thinner medication. If the patient is still at the rehab facility at time of the two week follow up appointment, the skilled rehab facility will also need to assist the patient in arranging follow up appointment in our office and any transportation needs.  POST-OPERATIVE OPIOID TAPER INSTRUCTIONS: . It is important to wean off of your opioid medication as soon as possible. If you do not  need pain medication after your surgery it is ok to stop day one. . Opioids include: o Codeine, Hydrocodone(Norco, Vicodin), Oxycodone(Percocet, oxycontin) and hydromorphone amongst others.  . Long term and even short term use of opiods can cause: o Increased pain response o Dependence o Constipation o Depression o Respiratory depression o And more.  . Withdrawal symptoms can include o Flu like symptoms o Nausea, vomiting o And more . Techniques to manage these symptoms o Hydrate well o Eat regular healthy meals o Stay active o Use relaxation techniques(deep breathing, meditating, yoga) . Do Not substitute Alcohol to help with tapering . If you have been on opioids for less than two weeks and do not have pain than it is ok to stop all together.  . Plan to wean off of opioids o This plan should start within one week post op of your joint replacement. o Maintain   the same interval or time between taking each dose and first decrease the dose.  o Cut the total daily intake of opioids by one tablet each day o Next start to increase the time between doses. o The last dose that should be eliminated is the evening dose.      MAKE SURE YOU:  . Understand these instructions.  . Will watch your condition.  . Will get help right away if you are not doing well or get worse.  Pick up stool softner and laxative for home use following surgery while on pain medications. Do not remove your dressing. The dressing is waterproof--it is OK to take showers. Continue to use ice for pain and swelling after surgery. Do not use any lotions or creams on the incision until instructed by your surgeon. Total Hip Protocol.   

## 2020-07-13 NOTE — ED Notes (Signed)
Pt reports trying to walk to her walker and feel. Pt denies being on the ground for a long time

## 2020-07-13 NOTE — Transfer of Care (Signed)
Immediate Anesthesia Transfer of Care Note  Patient: [name]  Procedure(s) Performed: ARTHROPLASTY BIPOLAR HIP (HEMIARTHROPLASTY) (Right Hip)  Patient Location: PACU  Anesthesia Type:General  Level of Consciousness: drowsy  Airway & Oxygen Therapy: Patient Spontanous Breathing and Patient connected to face mask oxygen  Post-op Assessment: Report given to RN and Post -op Vital signs reviewed and stable  Post vital signs: Reviewed and stable  Last Vitals:  Vitals Value Taken Time  BP 127/40 07/13/20 1904  Temp    Pulse 64 07/13/20 1906  Resp 14 07/13/20 1906  SpO2 100 % 07/13/20 1906  Vitals shown include unvalidated device data.  Last Pain:  Vitals:   07/13/20 1544  TempSrc: Oral  PainSc:          Complications: No complications documented.

## 2020-07-14 ENCOUNTER — Inpatient Hospital Stay (HOSPITAL_COMMUNITY): Payer: Medicare Other

## 2020-07-14 DIAGNOSIS — D72829 Elevated white blood cell count, unspecified: Secondary | ICD-10-CM | POA: Diagnosis not present

## 2020-07-14 DIAGNOSIS — I4891 Unspecified atrial fibrillation: Secondary | ICD-10-CM | POA: Diagnosis not present

## 2020-07-14 DIAGNOSIS — S72001A Fracture of unspecified part of neck of right femur, initial encounter for closed fracture: Secondary | ICD-10-CM | POA: Diagnosis not present

## 2020-07-14 DIAGNOSIS — I629 Nontraumatic intracranial hemorrhage, unspecified: Secondary | ICD-10-CM | POA: Diagnosis not present

## 2020-07-14 LAB — CBC
HCT: 25.5 % — ABNORMAL LOW (ref 36.0–46.0)
Hemoglobin: 8 g/dL — ABNORMAL LOW (ref 12.0–15.0)
MCH: 31.1 pg (ref 26.0–34.0)
MCHC: 31.4 g/dL (ref 30.0–36.0)
MCV: 99.2 fL (ref 80.0–100.0)
Platelets: 182 10*3/uL (ref 150–400)
RBC: 2.57 MIL/uL — ABNORMAL LOW (ref 3.87–5.11)
RDW: 13.7 % (ref 11.5–15.5)
WBC: 17.2 10*3/uL — ABNORMAL HIGH (ref 4.0–10.5)
nRBC: 0 % (ref 0.0–0.2)

## 2020-07-14 LAB — BASIC METABOLIC PANEL
Anion gap: 15 (ref 5–15)
BUN: 23 mg/dL (ref 8–23)
CO2: 17 mmol/L — ABNORMAL LOW (ref 22–32)
Calcium: 8.1 mg/dL — ABNORMAL LOW (ref 8.9–10.3)
Chloride: 107 mmol/L (ref 98–111)
Creatinine, Ser: 1.32 mg/dL — ABNORMAL HIGH (ref 0.44–1.00)
GFR, Estimated: 38 mL/min — ABNORMAL LOW (ref >60)
Glucose, Bld: 134 mg/dL — ABNORMAL HIGH (ref 70–99)
Potassium: 6.1 mmol/L — ABNORMAL HIGH (ref 3.5–5.1)
Sodium: 139 mmol/L (ref 135–145)

## 2020-07-14 MED ORDER — METOCLOPRAMIDE HCL 5 MG/ML IJ SOLN
5.0000 mg | Freq: Three times a day (TID) | INTRAMUSCULAR | Status: DC | PRN
Start: 2020-07-13 — End: 2020-07-16

## 2020-07-14 MED ORDER — SENNA 8.6 MG PO TABS
1.0000 | ORAL_TABLET | Freq: Two times a day (BID) | ORAL | Status: DC
  Administered 2020-07-14 – 2020-07-19 (×7): 8.6 mg via ORAL
  Filled 2020-07-14 (×10): qty 1

## 2020-07-14 MED ORDER — ASPIRIN EC 325 MG PO TBEC
325.0000 mg | DELAYED_RELEASE_TABLET | Freq: Every day | ORAL | Status: DC
  Administered 2020-07-14 – 2020-07-19 (×6): 325 mg via ORAL
  Filled 2020-07-14 (×6): qty 1

## 2020-07-14 MED ORDER — METOCLOPRAMIDE HCL 5 MG PO TABS: 5.0000 mg | ORAL_TABLET | Freq: Three times a day (TID) | ORAL | Status: DC | PRN

## 2020-07-14 MED ORDER — CEFAZOLIN SODIUM-DEXTROSE 2-4 GM/100ML-% IV SOLN
2.0000 g | Freq: Four times a day (QID) | INTRAVENOUS | Status: AC
  Administered 2020-07-14 (×2): 2 g via INTRAVENOUS
  Filled 2020-07-14 (×2): qty 100

## 2020-07-14 MED ORDER — ONDANSETRON HCL 4 MG PO TABS: 4.0000 mg | ORAL_TABLET | Freq: Four times a day (QID) | ORAL | Status: DC | PRN

## 2020-07-14 MED ORDER — LEVETIRACETAM 100 MG/ML PO SOLN
500.0000 mg | Freq: Every morning | ORAL | Status: DC
  Filled 2020-07-14 (×2): qty 5

## 2020-07-14 MED ORDER — DOCUSATE SODIUM 100 MG PO CAPS
100.0000 mg | ORAL_CAPSULE | Freq: Two times a day (BID) | ORAL | Status: DC
  Administered 2020-07-14: 100 mg via ORAL
  Filled 2020-07-14: qty 1

## 2020-07-14 MED ORDER — DOCUSATE SODIUM 50 MG/5ML PO LIQD
100.0000 mg | Freq: Two times a day (BID) | ORAL | Status: DC
  Administered 2020-07-14 – 2020-07-19 (×8): 100 mg via ORAL
  Filled 2020-07-14 (×9): qty 10

## 2020-07-14 MED ORDER — PHENOL 1.4 % MT LIQD: 1.0000 | OROMUCOSAL | Status: DC | PRN

## 2020-07-14 MED ORDER — ONDANSETRON HCL 4 MG/2ML IJ SOLN: 4.0000 mg | Freq: Four times a day (QID) | INTRAMUSCULAR | Status: DC | PRN

## 2020-07-14 MED ORDER — MENTHOL 3 MG MT LOZG: 1.0000 | LOZENGE | OROMUCOSAL | Status: DC | PRN

## 2020-07-14 NOTE — Evaluation (Signed)
Physical Therapy Evaluation Patient Details Name: [name] MRN: [id number] DOB: [birth year] Today's Date: 07/14/2020   History of Present Illness  The pt is a 87 yo female presenting from assisted living on 5/27 after unwitnessed fall. This is the patient's 4th fall this month. Imaging revealed: no acute intercranial abnormality, and R femoral neck fx. Pt is now s/p R THA on 5/27. PMH includes: dementia, HTN, HLD, afib, CAD, and intraventricular hemorrhage with residual L-sided deficits.    Clinical Impression  Pt in bed upon arrival of PT, agreeable to evaluation at this time. Prior to admission the pt was receiving assist for mobility and ADLs from staff of ALF. The pt now presents with limitations in functional mobility, strength, stability, and activity tolerance that are further impacted by lethargy, baseline cognitive deficits, and impaired command following at this time. The pt required totalA of 2 to complete all bed mobility, and was unsafe to attempt transfers due to continued lethargy despite all attempts to stimulate through verbal, tactile, auditory cues, and changes in position. The pt was able to answer ~5 simple, direct questions with mostly yes/no answers, but was unable to answer open-ended questions such as "what is your name" at time of eval. The pt will continue to benefit from skilled PT acutely as well as following d/c. If [place] or OP therapies and 24/7 assistance are available at her ALF, that would be safe place to d/c as it is a familiar environment and may be best place for her cognition. If not available, may need short term SNF.     Follow Up Recommendations Supervision/Assistance - 24 hour (HH therapies at ALF)    Equipment Recommendations  None recommended by PT    Recommendations for Other Services       Precautions / Restrictions Precautions Precautions: Fall Precaution Comments: dementia and L sided residual weakness at baseline Restrictions Weight  Bearing Restrictions: Yes RLE Weight Bearing: Weight bearing as tolerated      Mobility  Bed Mobility Overal bed mobility: Needs Assistance Bed Mobility: Supine to Sit;Sit to Supine     Supine to sit: Total assist;+2 for physical assistance Sit to supine: Total assist;+2 for physical assistance   General bed mobility comments: totalA of 2 to complete helicopter trasnfer. no active movement or attempt to initiate movement from pt. no grimacing or other reaction to pain with movement    Transfers                 General transfer comment: deferred due to lethargy and lack of command following        Balance Overall balance assessment: Needs assistance Sitting-balance support: No upper extremity supported Sitting balance-Leahy Scale: Zero Sitting balance - Comments: totalA posterior support, pt with posterior LOB with any reduction in posterior support Postural control: Posterior lean                                   Pertinent Vitals/Pain Pain Assessment: Faces Faces Pain Scale: Hurts a little bit Pain Location: slight grimacing/resistance with PROM. no overt moaning, pt denied pain x1 Pain Descriptors / Indicators: Grimacing Pain Intervention(s): Limited activity within patient's tolerance;Monitored during session;Repositioned    Home Living Family/patient expects to be discharged to:: Assisted living (vs SNF depending on level of assist available)                 Additional Comments: pt unable to contribute  to history    Prior Function Level of Independence: Needs assistance         Comments: pt unable to contribute to history taking at time of eval, per chart, pt had been receiving assist from daugther for gait and ADLs until ~1 month prior to admission due to significant assist needed by pt which prompted transition to ALF. presume pt receiving assist for all mobility and ADLs     Hand Dominance        Extremity/Trunk Assessment    Upper Extremity Assessment Upper Extremity Assessment: Defer to OT evaluation    Lower Extremity Assessment Lower Extremity Assessment: Generalized weakness;RLE deficits/detail;LLE deficits/detail RLE Deficits / Details: trace activation at quads, movement at toes. Not consistently to command. pt reports "I dont know" when asked if she could feel LT to R toes. RLE Sensation: decreased light touch;decreased proprioception LLE Deficits / Details: pt did not move to command through session, but tolerated full PROM at ankle, knee, and hip.    Cervical / Trunk Assessment Cervical / Trunk Assessment: Kyphotic (frail)  Communication      Cognition Arousal/Alertness: Lethargic Behavior During Therapy: Flat affect Overall Cognitive Status: History of cognitive impairments - at baseline                                 General Comments: Pt lethargic through session, did not open eyes despite attempts to stimulate with verbal, tactile, auditory stim, or changes in position. pt answered x4 simple yes/no questions with increased time. unable to state name or answer other open-ended questions.      General Comments General comments (skin integrity, edema, etc.): multiple wounds, RN called in to provide covering to wounds on R knee, L upper arm, and L thigh. incision on head C/D/I. VSS on 1L O2 with movement.    Exercises General Exercises - Lower Extremity Ankle Circles/Pumps: PROM;Both;10 reps Long Arc Quad: PROM;Both;10 reps;Seated   Assessment/Plan    PT Assessment Patient needs continued PT services  PT Problem List Decreased range of motion;Decreased strength;Decreased activity tolerance;Decreased balance;Decreased mobility;Decreased cognition;Decreased safety awareness;Decreased knowledge of precautions;Pain       PT Treatment Interventions DME instruction;Gait training;Functional mobility training;Therapeutic activities;Therapeutic exercise;Balance  training;Patient/family education    PT Goals (Current goals can be found in the Care Plan section)  Acute Rehab PT Goals Patient Stated Goal: none stated PT Goal Formulation: With patient Time For Goal Achievement: 07/28/20 Potential to Achieve Goals: Fair    Frequency Min 2X/week   Barriers to discharge        Co-evaluation PT/OT/SLP Co-Evaluation/Treatment: Yes Reason for Co-Treatment: Necessary to address cognition/behavior during functional activity;For patient/therapist safety;To address functional/ADL transfers PT goals addressed during session: Mobility/safety with mobility;Balance;Strengthening/ROM OT goals addressed during session: ADL's and self-care       AM-PAC PT "6 Clicks" Mobility  Outcome Measure Help needed turning from your back to your side while in a flat bed without using bedrails?: Total Help needed moving from lying on your back to sitting on the side of a flat bed without using bedrails?: Total Help needed moving to and from a bed to a chair (including a wheelchair)?: Total Help needed standing up from a chair using your arms (e.g., wheelchair or bedside chair)?: Total Help needed to walk in hospital room?: Total Help needed climbing 3-5 steps with a railing? : Total 6 Click Score: 6    End of Session Equipment Utilized During  Treatment: Oxygen Activity Tolerance: Patient limited by lethargy Patient left: in bed;with call bell/phone within reach;with SCD's reapplied;with bed alarm set Nurse Communication: Mobility status PT Visit Diagnosis: Other abnormalities of gait and mobility (R26.89);Muscle weakness (generalized) (M62.81)    Time: 5462-7035 PT Time Calculation (min) (ACUTE ONLY): 32 min   Charges:   PT Evaluation $PT Eval Moderate Complexity: 1 Mod          Karma Ganja, PT, DPT   Acute Rehabilitation Department Pager #: [phone]  Otho Bellows 07/14/2020, 10:52 AM

## 2020-07-14 NOTE — Plan of Care (Signed)

## 2020-07-14 NOTE — TOC CAGE-AID Note (Signed)
Transition of Care [facility]) - CAGE-AID Screening   Patient Details  Name: LENISHA LACAP MRN: [id number] Date of Birth: [birth year]  [facility] CM/SW Contact:    Clovis Cao, RN Phone Number: [phone] 07/14/2020, 10:29 AM   Clinical Narrative: Pt unable to participate in screening due to dementia/confusion.   CAGE-AID Screening: Substance Abuse Screening unable to be completed due to: : Patient unable to participate

## 2020-07-14 NOTE — Progress Notes (Addendum)
PROGRESS NOTE    [name]  VZD:[id number] DOB: 10-28-1926 DOA: 07/13/2020 PCP: Merrilee Seashore, MD  Outpatient Specialists:   Brief Narrative:  As per H&P done on admission:" [name] is a 87 y.o. 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  And dementia presents after having a unwitnessed fall.  History is obtained from daughter who is present at bedside as the patient has significant dementia.  Apparently the patient had been cared for at home by her daughter up until this month when it became too difficult for her to care for her at home.  At baseline patient is supposed to use a walker and needs assistance with ambulation, but forgets and tries get up on her own from time to time.  No known history of seizures and is on Keppra for prophylaxis.  Daughter was called around 5 AM and told that mother had fallen and was bleeding from her head.  It is not clear how long patient has been on the floor prior to being found by staff.  She is not on any blood thinners.  Prior to this patient had fallen 3 times May 4, but did not sustain any significant injuries and was not taken to the hospital.  Daughter confirms that the patient is to keep a DO NOT RESUSCITATE CODE STATUS in place.  ED Course: Upon admission to the emergency department patient was seen to be afebrile, pulse 55-64, respirations 12-25, blood pressures lowest 62/42 144/70, O2 saturation maintained on room air.  CT scan of the brain noted a large right posterior scalp laceration with small hematoma and without any intercranial abnormality appreciated.  X-rays revealed an acute right subcapital femoral neck fracture.  Labs significant for WBC 17.2, hemoglobin 10.4, glucose 160, and all other labs relatively within normal limits.  Influenza and COVID-19 screening were negative.  Orthopedics was  consulted and plan on taking the patient to surgery if medically clear.  Patient had been given a Tdap booster and he laceration of her scalp was repaired with 9 staples.  Patient also received 1 L normal saline IV fluids and morphine for pain".  07/14/2020: Patient underwent right hip hemiarthroplasty on 07/13/2020.  Patient is unable to give any history.  Patient seems quite sleepy.   Assessment & Plan:   Principal Problem:   Closed right hip fracture (HCC) Active Problems:   GERD   Atrial fibrillation (HCC)   Intracranial hemorrhage (HCC)   Leukocytosis   ABLA (acute blood loss anemia)   Scalp laceration   DNR (do not resuscitate)   Fall  Right hip fracture and right posterior scalp laceration secondary to fall:  Acute.  Patient had an unwitnessed fall at the assisted living facility.  Imaging studies noted a right subcapital femoral neck fracture and right scalp laceration with hematoma.  Laceration was repaired with staples by the ED provider.  Orthopedic was consulted.  Geriatric sensitive perioperative cardiac risk index probability of perioperative myocardial infarction or cardiac arrest is 10.1%.  Daughter is aware of the risk and would like to proceed with surgery.  Orthopedics plan for surgery today around 5 PM. -Admit to a surgical telemetry bed -Hip fracture order set utilized -N.p.o. -Hydrocodone/morphine IV as needed pain -Appreciate orthopedic surgery consultative services, we will follow-up for any further recommendation 07/14/2020: Patient underwent right hip hemiarthroplasty.  Orthopedic team is directing postop management.  Stat CT head to rule out  intracranial bleed.  Results of CT head done yesterday is noted.  CT head and cervical spine without contrast done yesterday revealed: 1. No acute intracranial abnormalities. 2. Chronic small vessel ischemic change and brain atrophy. 3. Large right posterior scalp laceration and small subgaleal hematoma. 4. No evidence for  cervical spine fracture  Leukocytosis:  WBC elevated at 17.2.  Suspect secondary to acute fracture. -No recent urinalysis.  Will reorder.    Transient hypotension: On admission patient's initial blood pressures were as low as 62/40, but improved after IV fluids.  Home blood pressure medications include metoprolol tartrate 25 mg daily and irbesartan 75 mg daily. -Held home blood pressure medication -Goal MAP greater than 65 -Reassess in a.m. and determine if needing to restart 07/14/2020: Blood pressure today is 110/52 mmHg.  Acute blood loss anemia:  Acute.  Hemoglobin 10.4 g/dL which is lower than previous of 13.2 g/dL on 4/25.  Suspect acute blood loss secondary to scalp laceration. -Continue to monitor and transfuse blood products if needed 07/06/2020: Hemoglobin today is 8 g/dL.  Continue to monitor closely.  Hypoxia:  Acute.  Patient O2 saturation dropped down to 84% after getting pain medications.  Suspect iatrogenic in nature. -Continuous pulse oximetry with nasal cannula oxygen maintain O2 saturations greater than 92% -Incentive spirometry 07/14/2020: Seems to have resolved.  Paroxysmal atrial fibrillation: Patient appears to be in atrial fibrillation, but is rate controlled.  Not on anticoagulation due to history of intracranial hemorrhage. 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 -Continue Keppra for seizure prophylaxis   GERD -Continue p.o. Pepcid  Dementia with behavioral disturbance -Set bed alarm on -Delirium precaution   DVT prophylaxis: SCD. Code Status: DNR Family Communication:  Disposition Plan: Awaiting skilled nursing facility placement   Consultants:   Orthopedic surgery  Procedures:   Right hip hemiarthroplasty  Antimicrobials:   None   Subjective: Patient is  still able to give any history. Patient is very sleepy.  Objective: Vitals:   07/14/20 0017 07/14/20 0309 07/14/20 0725 07/14/20 1500  BP: (!) 124/35 (!) 105/44 (!) 110/52 (!) 113/42  Pulse: 76 79 82 78  Resp: 15 18 18 18   Temp: 98.6 F (37 C) 98.9 F (37.2 C) 98.1 F (36.7 C) 98.5 F (36.9 C)  TempSrc: Oral Oral Oral Oral  SpO2: 94% 95% 97% 96%  Weight:      Height:        Intake/Output Summary (Last 24 hours) at 07/14/2020 1633 Last data filed at 07/14/2020 1500 Gross per 24 hour  Intake 655 ml  Output 400 ml  Net 255 ml   Filed Weights   07/13/20 0852  Weight: 61.2 kg    Examination:  General exam: Appears quite sleepy. Respiratory system: Clear to auscultation.  Cardiovascular system: S1 & S2 heard Gastrointestinal system: Abdomen is nondistended, soft and nontender. No organomegaly or masses felt. Normal bowel sounds heard. Central nervous system: Patient is sleepy. Extremities: No leg edema  Data Reviewed: I have personally reviewed following labs and imaging studies  CBC: Recent Labs  Lab 07/13/20 0616 07/13/20 1305 07/14/20 0226  WBC 17.2*  --  17.2*  NEUTROABS 12.8*  --   --   HGB 10.4* 9.4* 8.0*  HCT 32.5* 29.4* 25.5*  MCV 95.6  --  99.2  PLT 255  --  983   Basic Metabolic Panel: Recent Labs  Lab 07/13/20  0915 07/14/20 0226  NA 138 139  K 4.7 6.1*  CL 109 107  CO2 24 17*  GLUCOSE 160* 134*  BUN 17 23  CREATININE 0.96 1.32*  CALCIUM 8.2* 8.1*   GFR: Estimated Creatinine Clearance: 25.5 mL/min (A) (by C-G formula based on SCr of 1.32 mg/dL (H)). Liver Function Tests: No results for input(s): AST, ALT, ALKPHOS, BILITOT, PROT, ALBUMIN in the last 168 hours. No results for input(s): LIPASE, AMYLASE in the last 168 hours. No results for input(s): AMMONIA in the last 168 hours. Coagulation Profile: Recent Labs  Lab 07/13/20 0820  INR 1.1   Cardiac Enzymes: No results for input(s): CKTOTAL, CKMB, CKMBINDEX, TROPONINI in the last 168  hours. BNP (last 3 results) No results for input(s): PROBNP in the last 8760 hours. HbA1C: No results for input(s): HGBA1C in the last 72 hours. CBG: No results for input(s): GLUCAP in the last 168 hours. Lipid Profile: No results for input(s): CHOL, HDL, LDLCALC, TRIG, CHOLHDL, LDLDIRECT in the last 72 hours. Thyroid Function Tests: No results for input(s): TSH, T4TOTAL, FREET4, T3FREE, THYROIDAB in the last 72 hours. Anemia Panel: No results for input(s): VITAMINB12, FOLATE, FERRITIN, TIBC, IRON, RETICCTPCT in the last 72 hours. Urine analysis:    Component Value Date/Time   COLORURINE YELLOW 06/11/2020 2033   APPEARANCEUR CLOUDY (A) 06/11/2020 2033   LABSPEC 1.017 06/11/2020 2033   PHURINE 5.0 06/11/2020 2033   GLUCOSEU NEGATIVE 06/11/2020 2033   HGBUR SMALL (A) 06/11/2020 2033   BILIRUBINUR NEGATIVE 06/11/2020 2033   KETONESUR NEGATIVE 06/11/2020 2033   PROTEINUR NEGATIVE 06/11/2020 2033   UROBILINOGEN 0.2 08/17/2012 1829   NITRITE POSITIVE (A) 06/11/2020 2033   LEUKOCYTESUR LARGE (A) 06/11/2020 2033   Sepsis Labs: @LABRCNTIP (procalcitonin:4,lacticidven:4)  ) Recent Results (from the past 240 hour(s))  Resp Panel by RT-PCR (Flu A&B, Covid) Nasopharyngeal Swab     Status: None   Collection Time: 07/13/20  8:25 AM   Specimen: Nasopharyngeal Swab; Nasopharyngeal(NP) swabs in vial transport medium  Result Value Ref Range Status   SARS Coronavirus 2 by RT PCR NEGATIVE NEGATIVE Final    Comment: (NOTE) SARS-CoV-2 target nucleic acids are NOT DETECTED.  The SARS-CoV-2 RNA is generally detectable in upper respiratory specimens during the acute phase of infection. The lowest concentration of SARS-CoV-2 viral copies this assay can detect is 138 copies/mL. A negative result does not preclude SARS-Cov-2 infection and should not be used as the sole basis for treatment or other patient management decisions. A negative result may occur with  improper specimen collection/handling,  submission of specimen other than nasopharyngeal swab, presence of viral mutation(s) within the areas targeted by this assay, and inadequate number of viral copies(<138 copies/mL). A negative result must be combined with clinical observations, patient history, and epidemiological information. The expected result is Negative.  Fact Sheet for Patients:  EntrepreneurPulse.com.au  Fact Sheet for Healthcare Providers:  IncredibleEmployment.be  This test is no t yet approved or cleared by the [place] FDA and  has been authorized for detection and/or diagnosis of SARS-CoV-2 by FDA under an Emergency Use Authorization (EUA). This EUA will remain  in effect (meaning this test can be used) for the duration of the COVID-19 declaration under Section 564(b)(1) of the Act, 21 U.S.C.section 360bbb-3(b)(1), unless the authorization is terminated  or revoked sooner.       Influenza A by PCR NEGATIVE NEGATIVE Final   Influenza B by PCR NEGATIVE NEGATIVE Final    Comment: (NOTE) The Xpert Xpress SARS-CoV-2/FLU/RSV plus  assay is intended as an aid in the diagnosis of influenza from Nasopharyngeal swab specimens and should not be used as a sole basis for treatment. Nasal washings and aspirates are unacceptable for Xpert Xpress SARS-CoV-2/FLU/RSV testing.  Fact Sheet for Patients: EntrepreneurPulse.com.au  Fact Sheet for Healthcare Providers: IncredibleEmployment.be  This test is not yet approved or cleared by the [place] FDA and has been authorized for detection and/or diagnosis of SARS-CoV-2 by FDA under an Emergency Use Authorization (EUA). This EUA will remain in effect (meaning this test can be used) for the duration of the COVID-19 declaration under Section 564(b)(1) of the Act, 21 U.S.C. section 360bbb-3(b)(1), unless the authorization is terminated or revoked.  Performed at [facility], [place] [address], [place], [place] [address]          Radiology Studies: DG Chest 1 View  Result Date: 07/13/2020 CLINICAL DATA:  Unwitnessed fall, dementia. EXAM: CHEST  1 VIEW COMPARISON:  Chest radiograph 07/01/2019 and chest CT from 06/30/2019 FINDINGS: Stable calcified granuloma in the left lung. Old left posterior rib fractures. Lungs are clear without airspace disease or pulmonary edema. Heart size is stable. No large pneumothorax. Atherosclerotic calcifications at the aortic arch. IMPRESSION: No active disease. Electronically Signed   By: Markus Daft M.D.   On: 07/13/2020 08:22   CT Head Wo Contrast  Result Date: 07/13/2020 CLINICAL DATA:  Un witnessed fall.  Head trauma.  Neck trauma. EXAM: CT HEAD WITHOUT CONTRAST CT CERVICAL SPINE WITHOUT CONTRAST TECHNIQUE: Multidetector CT imaging of the head and cervical spine was performed following the standard protocol without intravenous contrast. Multiplanar CT image reconstructions of the cervical spine were also generated. COMPARISON:  03/12/2018 FINDINGS: CT HEAD FINDINGS Brain: No evidence of acute infarction, hemorrhage, hydrocephalus, extra-axial collection or mass lesion/mass effect. Marked prominence of sulci and ventricles compatible with brain atrophy. There is moderate to marked diffuse low-attenuation within the subcortical and periventricular white matter compatible with chronic microvascular disease. Vascular: No hyperdense vessel or unexpected calcification. Skull: Normal. Negative for fracture or focal lesion. Sinuses/Orbits: No acute finding. Other: Large right posterior scalp laceration and small subgaleal hematoma identified. CT CERVICAL SPINE FINDINGS Alignment: Normal alignment of the cervical spine. Skull base and vertebrae: The vertebral body heights are well preserved. The facet joints all appear well aligned. There is no fracture or dislocation. Soft tissues and spinal canal: No prevertebral fluid or swelling. No visible canal hematoma.  Disc levels: There is multilevel disc space narrowing and endplate spurring noted. This is most notable at C2-3 C4-5 and C5-6. Upper chest: Negative. Other: None IMPRESSION: 1. No acute intracranial abnormalities. 2. Chronic small vessel ischemic change and brain atrophy. 3. Large right posterior scalp laceration and small subgaleal hematoma. 4. No evidence for cervical spine fracture. Electronically Signed   By: Kerby Moors M.D.   On: 07/13/2020 07:49   CT Cervical Spine Wo Contrast  Result Date: 07/13/2020 CLINICAL DATA:  Un witnessed fall.  Head trauma.  Neck trauma. EXAM: CT HEAD WITHOUT CONTRAST CT CERVICAL SPINE WITHOUT CONTRAST TECHNIQUE: Multidetector CT imaging of the head and cervical spine was performed following the standard protocol without intravenous contrast. Multiplanar CT image reconstructions of the cervical spine were also generated. COMPARISON:  03/12/2018 FINDINGS: CT HEAD FINDINGS Brain: No evidence of acute infarction, hemorrhage, hydrocephalus, extra-axial collection or mass lesion/mass effect. Marked prominence of sulci and ventricles compatible with brain atrophy. There is moderate to marked diffuse low-attenuation within the subcortical and periventricular white matter compatible with  chronic microvascular disease. Vascular: No hyperdense vessel or unexpected calcification. Skull: Normal. Negative for fracture or focal lesion. Sinuses/Orbits: No acute finding. Other: Large right posterior scalp laceration and small subgaleal hematoma identified. CT CERVICAL SPINE FINDINGS Alignment: Normal alignment of the cervical spine. Skull base and vertebrae: The vertebral body heights are well preserved. The facet joints all appear well aligned. There is no fracture or dislocation. Soft tissues and spinal canal: No prevertebral fluid or swelling. No visible canal hematoma. Disc levels: There is multilevel disc space narrowing and endplate spurring noted. This is most notable at C2-3 C4-5 and  C5-6. Upper chest: Negative. Other: None IMPRESSION: 1. No acute intracranial abnormalities. 2. Chronic small vessel ischemic change and brain atrophy. 3. Large right posterior scalp laceration and small subgaleal hematoma. 4. No evidence for cervical spine fracture. Electronically Signed   By: Kerby Moors M.D.   On: 07/13/2020 07:49   DG C-Arm 1-60 Min  Result Date: 07/13/2020 CLINICAL DATA:  Right hip arthroplasty. EXAM: DG C-ARM 1-60 MIN; OPERATIVE RIGHT HIP WITH PELVIS FLUOROSCOPY TIME:  Fluoroscopy Time:  15 seconds Radiation Exposure Index (if provided by the fluoroscopic device): 1.71 mGy Number of Acquired Spot Images: 4 COMPARISON:  Preoperative radiograph 07/13/2020 FINDINGS: Four fluoroscopic spot views obtained in the operating room of the right hip and pelvis during right hip arthroplasty. IMPRESSION: Fluoroscopic spot views during right hip arthroplasty. Electronically Signed   By: Keith Rake M.D.   On: 07/13/2020 18:48   DG HIP OPERATIVE UNILAT WITH PELVIS RIGHT  Result Date: 07/13/2020 CLINICAL DATA:  Right hip arthroplasty. EXAM: DG C-ARM 1-60 MIN; OPERATIVE RIGHT HIP WITH PELVIS FLUOROSCOPY TIME:  Fluoroscopy Time:  15 seconds Radiation Exposure Index (if provided by the fluoroscopic device): 1.71 mGy Number of Acquired Spot Images: 4 COMPARISON:  Preoperative radiograph 07/13/2020 FINDINGS: Four fluoroscopic spot views obtained in the operating room of the right hip and pelvis during right hip arthroplasty. IMPRESSION: Fluoroscopic spot views during right hip arthroplasty. Electronically Signed   By: Keith Rake M.D.   On: 07/13/2020 18:48   DG HIP UNILAT WITH PELVIS 2-3 VIEWS RIGHT  Result Date: 07/13/2020 CLINICAL DATA:  Fall, hip pain, dementia EXAM: DG HIP (WITH OR WITHOUT PELVIS) 2-3V RIGHT COMPARISON:  None. FINDINGS: There is an acute displaced and angulated right hip subcapital femoral neck fracture. Femoral head remains located. Bones are osteopenic. Degenerative  changes and scoliosis of lower lumbar spine. Nonobstructive bowel gas pattern. IMPRESSION: Acute right hip subcapital femoral neck fracture. Osteopenia Electronically Signed   By: Jerilynn Mages.  Shick M.D.   On: 07/13/2020 08:23        Scheduled Meds: . aspirin EC  325 mg Oral Q breakfast  . docusate  100 mg Oral BID  . [START ON 07/15/2020] levETIRAcetam  500 mg Oral q AM  . melatonin  10 mg Oral QHS  . pantoprazole  40 mg Oral Daily  . senna  1 tablet Oral BID  . sertraline  50 mg Oral Daily   Continuous Infusions:   LOS: 1 day    Time spent: 35 minutes    Dana Allan, MD  [facility] Pager #: [phone] 7PM-7AM contact night coverage as above

## 2020-07-14 NOTE — Progress Notes (Signed)
Orthopedic Tech Progress Note Patient Details:  [name] [birth year] [id number]  Patient ID: [name], female   DOB: [birth year], 87 y.o.   MRN: [id number] Pt does not meet criteria for OHF.  Karolee Stamps 07/14/2020, 8:36 PM

## 2020-07-14 NOTE — Evaluation (Signed)
Occupational Therapy Evaluation Patient Details Name: [name] MRN: [id number] DOB: 12/02/1926 Today's Date: 07/14/2020    History of Present Illness The pt is a 87 yo female presenting from assisted living on 5/27 after unwitnessed fall. This is the patient's 4th fall this month. Imaging revealed: no acute intercranial abnormality, and R femoral neck fx. Pt is now s/p R THA on 5/27. PMH includes: dementia, HTN, HLD, afib, CAD, and intraventricular hemorrhage with residual L-sided deficits. (Simultaneous filing. User may not have seen previous data.)   Clinical Impression   Pt admitted to the ED for concerns and procedure listed above. PTA pt's chart reports that she was receiving assistance with ADL's and ambulating minimally with a RW. At the time of the evaluation, pt was minimally responsive to yes/no questions, did not respond to any movement of painful stimulus. Pt required total assist +2 for bed mobility and no OOB transfers were attempted this session due to lethargy and not being able to respond to commands. Acutely pt will benefit from continued OT to address concerns listed below, as well as at d/c. Pt will require 24/7 supervision and therapy once discharged, if available at current residence, that would be the best option for familiarity, however if not a SNF placement may be required.     Follow Up Recommendations  SNF;Supervision/Assistance - 24 hour    Equipment Recommendations  Other (comment) (TBD)    Recommendations for Other Services       Precautions / Restrictions Precautions Precautions: Posterior Hip;Fall (Simultaneous filing. User may not have seen previous data.) Precaution Comments: dementia and L sided residual weakness at baseline Restrictions Weight Bearing Restrictions: Yes RLE Weight Bearing: Weight bearing as tolerated      Mobility Bed Mobility Overal bed mobility: Needs Assistance (Simultaneous filing. User may not have seen previous  data.) Bed Mobility: Supine to Sit;Sit to Supine     Supine to sit: Total assist;+2 for physical assistance Sit to supine: Total assist;+2 for physical assistance   General bed mobility comments: totalA of 2 to complete helicopter transfer. no active movement or attempt to initiate movement from pt. no grimacing or other reaction to pain with movement (Simultaneous filing. User may not have seen previous data.)    Transfers                 General transfer comment: deferred due to lethargy and lack of command following (Simultaneous filing. User may not have seen previous data.)    Balance Overall balance assessment: Needs assistance (Simultaneous filing. User may not have seen previous data.) Sitting-balance support: No upper extremity supported Sitting balance-Leahy Scale: Zero Sitting balance - Comments: totalA posterior support, pt with posterior LOB with any reduction in posterior support Postural control: Posterior lean     Standing balance comment: deferred due to lethargy and lack of command following                           ADL either performed or assessed with clinical judgement   ADL Overall ADL's : Needs assistance/impaired Eating/Feeding: Moderate assistance;Bed level Eating/Feeding Details (indicate cue type and reason): pt requires a feeder Grooming: Wash/dry face;Maximal assistance;Total assistance;Sitting Grooming Details (indicate cue type and reason): Pt sitting EOB, OT placed washcloth in pt hand and brought hand to face, pt held washcloth on face with OT support elbow, she did not attempt to wipe her face. Upper Body Bathing: Total assistance;Bed level   Lower Body Bathing:  Total assistance;Bed level   Upper Body Dressing : Total assistance;Sitting   Lower Body Dressing: Total assistance;Bed level   Toilet Transfer: Total assistance;+2 for physical assistance;+2 for safety/equipment Toilet Transfer Details (indicate cue type and  reason): Unable to attempt this session due to pt lethargy and resistive behaviors. Toileting- Clothing Manipulation and Hygiene: Total assistance;+2 for physical assistance;+2 for safety/equipment   Tub/ Shower Transfer: Total assistance;+2 for physical assistance;+2 for safety/equipment Tub/Shower Transfer Details (indicate cue type and reason): Unable to attempt this session due to pt lethargy and resistive behaviors.   General ADL Comments: Pt required total assist to come to EOB, max posterior support while sitting EOB     Vision Patient Visual Report: Other (comment) (Pt won't open her eyes) Additional Comments: Pt won't open her eyes, keeps them sealed shut the entire session. RN reported she has only seen her slightly open her eye's 1 time this morning     Perception Perception Perception Tested?: No   Praxis Praxis Praxis tested?: Not tested    Pertinent Vitals/Pain Pain Assessment: Faces (Simultaneous filing. User may not have seen previous data.) Faces Pain Scale: Hurts a little bit (Simultaneous filing. User may not have seen previous data.) Pain Location: generalized (Simultaneous filing. User may not have seen previous data.) Pain Descriptors / Indicators: Grimacing;Moaning (Simultaneous filing. User may not have seen previous data.) Pain Intervention(s): Monitored during session;Repositioned;Ice applied (Simultaneous filing. User may not have seen previous data.)     Hand Dominance Right   Extremity/Trunk Assessment Upper Extremity Assessment Upper Extremity Assessment: Difficult to assess due to impaired cognition (Simultaneous filing. User may not have seen previous data.)   Lower Extremity Assessment Lower Extremity Assessment: Defer to PT evaluation (Simultaneous filing. User may not have seen previous data.) RLE Deficits / Details: trace activation at quads, movement at toes. Not consistently to command. pt reports "I dont know" when asked if she could feel LT  to R toes. RLE Sensation: decreased light touch;decreased proprioception LLE Deficits / Details: pt did not move to command through session, but tolerated full PROM at ankle, knee, and hip.   Cervical / Trunk Assessment Cervical / Trunk Assessment: Kyphotic (frail  Simultaneous filing. User may not have seen previous data.)   Communication Communication Communication: Other (comment) (Pt only responding to 25% of yes no questions)   Cognition Arousal/Alertness: Lethargic (Simultaneous filing. User may not have seen previous data.) Behavior During Therapy: Flat affect (Simultaneous filing. User may not have seen previous data.) Overall Cognitive Status: No family/caregiver present to determine baseline cognitive functioning (Simultaneous filing. User may not have seen previous data.)                                 General Comments: Pt did not open her eyes for the entire session, only responding to some yes/no questions, resistive when OT/PT attempts to assist movement in any extremity. (Simultaneous filing. User may not have seen previous data.)   General Comments  multiple wounds, RN called in to provide covering to wounds on R knee, L upper arm, and L thigh. incision on head C/D/I. VSS on 1L O2 with movement. (Simultaneous filing. User may not have seen previous data.)    Exercises Exercises: General Lower Extremity General Exercises - Lower Extremity Ankle Circles/Pumps: PROM;Both;10 reps Long Arc Quad: PROM;Both;10 reps;Seated   Shoulder Instructions      Home Living Family/patient expects to be discharged to:: Skilled nursing  facility (Simultaneous filing. User may not have seen previous data.)                                 Additional Comments: pt unable to contribute to history      Prior Functioning/Environment Level of Independence: Needs assistance (Simultaneous filing. User may not have seen previous data.)  Gait / Transfers Assistance  Needed: Used a RW and per daughter, pt walked very little ADL's / Homemaking Assistance Needed: Pt requires assist with all ADL's due to cognitive impairments.   Comments: pt unable to contribute to history taking at time of eval, per chart, pt had been receiving assist from daugther for gait and ADLs until ~1 month prior to admission due to significant assist needed by pt which prompted transition to ALF. presume pt receiving assist for all mobility and ADLs        OT Problem List: Decreased strength;Decreased range of motion;Decreased activity tolerance;Impaired balance (sitting and/or standing);Decreased coordination;Decreased cognition;Decreased safety awareness;Decreased knowledge of use of DME or AE;Decreased knowledge of precautions;Impaired UE functional use;Pain      OT Treatment/Interventions: Self-care/ADL training;Therapeutic exercise;Energy conservation;DME and/or AE instruction;Therapeutic activities;Cognitive remediation/compensation;Patient/family education;Balance training    OT Goals(Current goals can be found in the care plan section) Acute Rehab OT Goals Patient Stated Goal: none stated (Simultaneous filing. User may not have seen previous data.) OT Goal Formulation: Patient unable to participate in goal setting Time For Goal Achievement: 07/28/20 Potential to Achieve Goals: Fair ADL Goals Pt Will Perform Eating: with set-up;with supervision;bed level Pt Will Perform Grooming: with set-up;with supervision;bed level Additional ADL Goal #1: Pt will sit EOB with min support for 3+ mins to prepare for seated ADL's.  OT Frequency: Min 2X/week   Barriers to D/C:            Co-evaluation PT/OT/SLP Co-Evaluation/Treatment: Yes Reason for Co-Treatment: Necessary to address cognition/behavior during functional activity;For patient/therapist safety;To address functional/ADL transfers PT goals addressed during session: Mobility/safety with  mobility;Balance;Strengthening/ROM OT goals addressed during session: ADL's and self-care      AM-PAC OT "6 Clicks" Daily Activity     Outcome Measure Help from another person eating meals?: A Lot Help from another person taking care of personal grooming?: A Lot Help from another person toileting, which includes using toliet, bedpan, or urinal?: Total Help from another person bathing (including washing, rinsing, drying)?: Total Help from another person to put on and taking off regular upper body clothing?: A Lot Help from another person to put on and taking off regular lower body clothing?: Total 6 Click Score: 9   End of Session Equipment Utilized During Treatment: Oxygen Nurse Communication: Mobility status;Other (comment) (Pt wounds on RLE and LUE)  Activity Tolerance: Patient limited by lethargy Patient left: in bed;with call bell/phone within reach;with bed alarm set  OT Visit Diagnosis: Unsteadiness on feet (R26.81);Muscle weakness (generalized) (M62.81);History of falling (Z91.81);Repeated falls (R29.6)                Time: 5329-9242 OT Time Calculation (min): 34 min Charges:  OT General Charges $OT Visit: 1 Visit OT Evaluation $OT Eval Moderate Complexity: [place]., OTR/L Acute Rehabilitation  Truett Mcfarlan Elane Yolanda Bonine 07/14/2020, 11:10 AM

## 2020-07-15 LAB — CBC WITH DIFFERENTIAL/PLATELET
Abs Immature Granulocytes: 0.15 10*3/uL — ABNORMAL HIGH (ref 0.00–0.07)
Basophils Absolute: 0 10*3/uL (ref 0.0–0.1)
Basophils Relative: 0 %
Eosinophils Absolute: 0.2 10*3/uL (ref 0.0–0.5)
Eosinophils Relative: 2 %
HCT: 20.6 % — ABNORMAL LOW (ref 36.0–46.0)
Hemoglobin: 6.7 g/dL — CL (ref 12.0–15.0)
Immature Granulocytes: 1 %
Lymphocytes Relative: 13 %
Lymphs Abs: 1.4 10*3/uL (ref 0.7–4.0)
MCH: 31 pg (ref 26.0–34.0)
MCHC: 32.5 g/dL (ref 30.0–36.0)
MCV: 95.4 fL (ref 80.0–100.0)
Monocytes Absolute: 2.2 10*3/uL — ABNORMAL HIGH (ref 0.1–1.0)
Monocytes Relative: 20 %
Neutro Abs: 6.7 10*3/uL (ref 1.7–7.7)
Neutrophils Relative %: 64 %
Platelets: 144 10*3/uL — ABNORMAL LOW (ref 150–400)
RBC: 2.16 MIL/uL — ABNORMAL LOW (ref 3.87–5.11)
RDW: 13.9 % (ref 11.5–15.5)
WBC: 10.6 10*3/uL — ABNORMAL HIGH (ref 4.0–10.5)
nRBC: 0 % (ref 0.0–0.2)

## 2020-07-15 LAB — HEMOGLOBIN AND HEMATOCRIT, BLOOD
HCT: 25.2 % — ABNORMAL LOW (ref 36.0–46.0)
Hemoglobin: 8.2 g/dL — ABNORMAL LOW (ref 12.0–15.0)

## 2020-07-15 LAB — RENAL FUNCTION PANEL
Albumin: 2.4 g/dL — ABNORMAL LOW (ref 3.5–5.0)
Anion gap: 5 (ref 5–15)
BUN: 31 mg/dL — ABNORMAL HIGH (ref 8–23)
CO2: 23 mmol/L (ref 22–32)
Calcium: 7.8 mg/dL — ABNORMAL LOW (ref 8.9–10.3)
Chloride: 107 mmol/L (ref 98–111)
Creatinine, Ser: 1.27 mg/dL — ABNORMAL HIGH (ref 0.44–1.00)
GFR, Estimated: 40 mL/min — ABNORMAL LOW (ref >60)
Glucose, Bld: 127 mg/dL — ABNORMAL HIGH (ref 70–99)
Phosphorus: 2.9 mg/dL (ref 2.5–4.6)
Potassium: 4.2 mmol/L (ref 3.5–5.1)
Sodium: 135 mmol/L (ref 135–145)

## 2020-07-15 LAB — GLUCOSE, CAPILLARY: Glucose-Capillary: 109 mg/dL — ABNORMAL HIGH (ref 70–99)

## 2020-07-15 LAB — PREPARE RBC (CROSSMATCH)

## 2020-07-15 LAB — MAGNESIUM: Magnesium: 1.6 mg/dL — ABNORMAL LOW (ref 1.7–2.4)

## 2020-07-15 MED ORDER — SODIUM CHLORIDE 0.9% IV SOLUTION: Freq: Once | INTRAVENOUS | Status: AC

## 2020-07-15 MED ORDER — MAGNESIUM SULFATE 2 GM/50ML IV SOLN
2.0000 g | Freq: Once | INTRAVENOUS | Status: AC
  Administered 2020-07-15: 2 g via INTRAVENOUS
  Filled 2020-07-15: qty 50

## 2020-07-15 NOTE — Progress Notes (Signed)
PROGRESS NOTE    [name]  KAJ:[id number] DOB: 1926/11/03 DOA: 07/13/2020 PCP: Merrilee Seashore, MD  Outpatient Specialists:   Brief Narrative:  As per H&P done on admission:" [name] is a 87 y.o. 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  And dementia presents after having a unwitnessed fall.  History is obtained from daughter who is present at bedside as the patient has significant dementia.  Apparently the patient had been cared for at home by her daughter up until this month when it became too difficult for her to care for her at home.  At baseline patient is supposed to use a walker and needs assistance with ambulation, but forgets and tries get up on her own from time to time.  No known history of seizures and is on Keppra for prophylaxis.  Daughter was called around 5 AM and told that mother had fallen and was bleeding from her head.  It is not clear how long patient has been on the floor prior to being found by staff.  She is not on any blood thinners.  Prior to this patient had fallen 3 times May 4, but did not sustain any significant injuries and was not taken to the hospital.  Daughter confirms that the patient is to keep a DO NOT RESUSCITATE CODE STATUS in place.  ED Course: Upon admission to the emergency department patient was seen to be afebrile, pulse 55-64, respirations 12-25, blood pressures lowest 62/42 144/70, O2 saturation maintained on room air.  CT scan of the brain noted a large right posterior scalp laceration with small hematoma and without any intercranial abnormality appreciated.  X-rays revealed an acute right subcapital femoral neck fracture.  Labs significant for WBC 17.2, hemoglobin 10.4, glucose 160, and all other labs relatively within normal limits.  Influenza and COVID-19 screening were negative.  Orthopedics was  consulted and plan on taking the patient to surgery if medically clear.  Patient had been given a Tdap booster and he laceration of her scalp was repaired with 9 staples.  Patient also received 1 L normal saline IV fluids and morphine for pain".  07/14/2020: Patient underwent right hip hemiarthroplasty on 07/13/2020.  Patient is unable to give any history.  Patient seems quite sleepy.  07/15/2020: Patient seen.  Repeat CT head was nonrevealing.  Drop in hemoglobin noted.  Hemoglobin this morning was 6.7 g/dL.  Repeat H/H is pending.  Likely, patient will need blood transfusion.    Assessment & Plan:   Principal Problem:   Closed right hip fracture (HCC) Active Problems:   GERD   Atrial fibrillation (HCC)   Intracranial hemorrhage (HCC)   Leukocytosis   ABLA (acute blood loss anemia)   Scalp laceration   DNR (do not resuscitate)   Fall  Right hip fracture and right posterior scalp laceration secondary to fall:  Acute.  Patient had an unwitnessed fall at the assisted living facility.  Imaging studies noted a right subcapital femoral neck fracture and right scalp laceration with hematoma.  Laceration was repaired with staples by the ED provider.  Orthopedic was consulted.  Geriatric sensitive perioperative cardiac risk index probability of perioperative myocardial infarction or cardiac arrest is 10.1%.  Daughter is aware of the risk and would like to proceed with surgery.  Orthopedics plan for surgery today around 5 PM. -Admit to a surgical telemetry bed -Hip fracture order set utilized -N.p.o. -Hydrocodone/morphine IV  as needed pain -Appreciate orthopedic surgery consultative services, we will follow-up for any further recommendation 07/14/2020: Patient underwent right hip hemiarthroplasty.  Orthopedic team is directing postop management.  Stat CT head to rule out intracranial bleed.  Results of CT head done yesterday is noted.  CT head and cervical spine without contrast done yesterday  revealed: 1. No acute intracranial abnormalities. 2. Chronic small vessel ischemic change and brain atrophy. 3. Large right posterior scalp laceration and small subgaleal hematoma. 4. No evidence for cervical spine fracture  07/15/2020: Orthopedic team is directing postop care.  Leukocytosis:  WBC elevated at 17.2.  Suspect secondary to acute fracture. -No recent urinalysis.  Will reorder.   07/15/2020: Resolved.  Transient hypotension: On admission patient's initial blood pressures were as low as 62/40, but improved after IV fluids.  Home blood pressure medications include metoprolol tartrate 25 mg daily and irbesartan 75 mg daily. -Held home blood pressure medication -Goal MAP greater than 65 -Reassess in a.m. and determine if needing to restart 07/15/2020: Blood pressure has remained stable.  Acute blood loss anemia:  Acute.  Hemoglobin 10.4 g/dL which is lower than previous of 13.2 g/dL on 4/25.  Suspect acute blood loss secondary to scalp laceration. -Continue to monitor and transfuse blood products if needed 07/06/2020: Hemoglobin today is 8 g/dL.  Continue to monitor closely. 07/15/2020: Hemoglobin is down to 6.7 g/dL today.  Awaiting repeat H/H.  Likely, patient may need blood transfusion.  Hypoxia:  Acute.  Patient O2 saturation dropped down to 84% after getting pain medications.  Suspect iatrogenic in nature. -Continuous pulse oximetry with nasal cannula oxygen maintain O2 saturations greater than 92% -Incentive spirometry 07/14/2020: Seems to have resolved.  Paroxysmal atrial fibrillation: Patient appears to be in atrial fibrillation, but is rate controlled.  Not on anticoagulation due to history of intracranial hemorrhage. 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 -Continue Keppra for seizure  prophylaxis   GERD -Continue p.o. Pepcid  Dementia with behavioral disturbance -Set bed alarm on -Delirium precaution  Hypomagnesemia: -IV magnesium 2 g x 1 dose.   DVT prophylaxis: SCD. Code Status: DNR Family Communication:  Disposition Plan: Awaiting skilled nursing facility placement   Consultants:   Orthopedic surgery  Procedures:   Right hip hemiarthroplasty  Antimicrobials:   None   Subjective: No history from patient (Patient closes her eyes and wouldn't barely speak).  Objective: Vitals:   07/15/20 0939 07/15/20 0944 07/15/20 1200 07/15/20 1551  BP: (!) 121/39 (!) 119/42 (!) 132/42 (!) 122/40  Pulse: 80  68 77  Resp: 18  16 17   Temp: 98.8 F (37.1 C) 98.8 F (37.1 C) 98.1 F (36.7 C) 98.3 F (36.8 C)  TempSrc: Axillary Axillary Axillary Oral  SpO2: 100% 100% 100% 100%  Weight:      Height:        Intake/Output Summary (Last 24 hours) at 07/15/2020 1600 Last data filed at 07/15/2020 1500 Gross per 24 hour  Intake 391 ml  Output 1050 ml  Net -659 ml   Filed Weights   07/13/20 0852  Weight: 61.2 kg    Examination:  General exam: Stable.  Not in any distress..  Patient keeps her eyes closed.   Respiratory system: Clear to auscultation.  Cardiovascular system: S1 & S2 heard Gastrointestinal system: Abdomen is nondistended, soft and nontender. No organomegaly or masses felt. Normal bowel sounds heard. Central  nervous system: Patient will not cooperate.. Extremities: No leg edema  Data Reviewed: I have personally reviewed following labs and imaging studies  CBC: Recent Labs  Lab 07/13/20 0616 07/13/20 1305 07/14/20 0226 07/15/20 0507  WBC 17.2*  --  17.2* 10.6*  NEUTROABS 12.8*  --   --  6.7  HGB 10.4* 9.4* 8.0* 6.7*  HCT 32.5* 29.4* 25.5* 20.6*  MCV 95.6  --  99.2 95.4  PLT 255  --  182 378*   Basic Metabolic Panel: Recent Labs  Lab 07/13/20 0915 07/14/20 0226 07/15/20 0507  NA 138 139 135  K 4.7 6.1* 4.2  CL 109 107  107  CO2 24 17* 23  GLUCOSE 160* 134* 127*  BUN 17 23 31*  CREATININE 0.96 1.32* 1.27*  CALCIUM 8.2* 8.1* 7.8*  MG  --   --  1.6*  PHOS  --   --  2.9   GFR: Estimated Creatinine Clearance: 26.5 mL/min (A) (by C-G formula based on SCr of 1.27 mg/dL (H)). Liver Function Tests: Recent Labs  Lab 07/15/20 0507  ALBUMIN 2.4*   No results for input(s): LIPASE, AMYLASE in the last 168 hours. No results for input(s): AMMONIA in the last 168 hours. Coagulation Profile: Recent Labs  Lab 07/13/20 0820  INR 1.1   Cardiac Enzymes: No results for input(s): CKTOTAL, CKMB, CKMBINDEX, TROPONINI in the last 168 hours. BNP (last 3 results) No results for input(s): PROBNP in the last 8760 hours. HbA1C: No results for input(s): HGBA1C in the last 72 hours. CBG: No results for input(s): GLUCAP in the last 168 hours. Lipid Profile: No results for input(s): CHOL, HDL, LDLCALC, TRIG, CHOLHDL, LDLDIRECT in the last 72 hours. Thyroid Function Tests: No results for input(s): TSH, T4TOTAL, FREET4, T3FREE, THYROIDAB in the last 72 hours. Anemia Panel: No results for input(s): VITAMINB12, FOLATE, FERRITIN, TIBC, IRON, RETICCTPCT in the last 72 hours. Urine analysis:    Component Value Date/Time   COLORURINE YELLOW 06/11/2020 2033   APPEARANCEUR CLOUDY (A) 06/11/2020 2033   LABSPEC 1.017 06/11/2020 2033   PHURINE 5.0 06/11/2020 2033   GLUCOSEU NEGATIVE 06/11/2020 2033   HGBUR SMALL (A) 06/11/2020 2033   BILIRUBINUR NEGATIVE 06/11/2020 2033   KETONESUR NEGATIVE 06/11/2020 2033   PROTEINUR NEGATIVE 06/11/2020 2033   UROBILINOGEN 0.2 08/17/2012 1829   NITRITE POSITIVE (A) 06/11/2020 2033   LEUKOCYTESUR LARGE (A) 06/11/2020 2033   Sepsis Labs: @LABRCNTIP (procalcitonin:4,lacticidven:4)  ) Recent Results (from the past 240 hour(s))  Resp Panel by RT-PCR (Flu A&B, Covid) Nasopharyngeal Swab     Status: None   Collection Time: 07/13/20  8:25 AM   Specimen: Nasopharyngeal Swab; Nasopharyngeal(NP)  swabs in vial transport medium  Result Value Ref Range Status   SARS Coronavirus 2 by RT PCR NEGATIVE NEGATIVE Final    Comment: (NOTE) SARS-CoV-2 target nucleic acids are NOT DETECTED.  The SARS-CoV-2 RNA is generally detectable in upper respiratory specimens during the acute phase of infection. The lowest concentration of SARS-CoV-2 viral copies this assay can detect is 138 copies/mL. A negative result does not preclude SARS-Cov-2 infection and should not be used as the sole basis for treatment or other patient management decisions. A negative result may occur with  improper specimen collection/handling, submission of specimen other than nasopharyngeal swab, presence of viral mutation(s) within the areas targeted by this assay, and inadequate number of viral copies(<138 copies/mL). A negative result must be combined with clinical observations, patient history, and epidemiological information. The expected result is Negative.  Fact Sheet  for Patients:  EntrepreneurPulse.com.au  Fact Sheet for Healthcare Providers:  IncredibleEmployment.be  This test is no t yet approved or cleared by the [place] FDA and  has been authorized for detection and/or diagnosis of SARS-CoV-2 by FDA under an Emergency Use Authorization (EUA). This EUA will remain  in effect (meaning this test can be used) for the duration of the COVID-19 declaration under Section 564(b)(1) of the Act, 21 U.S.C.section 360bbb-3(b)(1), unless the authorization is terminated  or revoked sooner.       Influenza A by PCR NEGATIVE NEGATIVE Final   Influenza B by PCR NEGATIVE NEGATIVE Final    Comment: (NOTE) The Xpert Xpress SARS-CoV-2/FLU/RSV plus assay is intended as an aid in the diagnosis of influenza from Nasopharyngeal swab specimens and should not be used as a sole basis for treatment. Nasal washings and aspirates are unacceptable for Xpert Xpress  SARS-CoV-2/FLU/RSV testing.  Fact Sheet for Patients: EntrepreneurPulse.com.au  Fact Sheet for Healthcare Providers: IncredibleEmployment.be  This test is not yet approved or cleared by the [place] FDA and has been authorized for detection and/or diagnosis of SARS-CoV-2 by FDA under an Emergency Use Authorization (EUA). This EUA will remain in effect (meaning this test can be used) for the duration of the COVID-19 declaration under Section 564(b)(1) of the Act, 21 U.S.C. section 360bbb-3(b)(1), unless the authorization is terminated or revoked.  Performed at [facility], [place] [address] [place], [place] [address]          Radiology Studies: CT HEAD WO CONTRAST  Result Date: 07/14/2020 CLINICAL DATA:  Follow-up EXAM: CT HEAD WITHOUT CONTRAST TECHNIQUE: Contiguous axial images were obtained from the base of the skull through the vertex without intravenous contrast. COMPARISON:  Jul 13, 2020, March 12, 2018 FINDINGS: Brain: No evidence of acute infarction, hemorrhage, hydrocephalus, extra-axial collection or mass lesion/mass effect. Periventricular white matter hypodensities consistent with sequela of chronic microvascular ischemic disease. Global parenchymal volume loss. Remote lacunar infarctions the RIGHT basal ganglia. Vascular: Vascular calcifications. Skull: No acute fracture. Sinuses/Orbits: No acute finding. Other: Staples overlie the RIGHT parietal scalp. Decreased conspicuity of previously described scalp hematoma. IMPRESSION: No acute intracranial abnormality. Electronically Signed   By: Valentino Saxon MD   On: 07/14/2020 18:22   DG C-Arm 1-60 Min  Result Date: 07/13/2020 CLINICAL DATA:  Right hip arthroplasty. EXAM: DG C-ARM 1-60 MIN; OPERATIVE RIGHT HIP WITH PELVIS FLUOROSCOPY TIME:  Fluoroscopy Time:  15 seconds Radiation Exposure Index (if provided by the fluoroscopic device): 1.71 mGy Number of Acquired Spot Images: 4  COMPARISON:  Preoperative radiograph 07/13/2020 FINDINGS: Four fluoroscopic spot views obtained in the operating room of the right hip and pelvis during right hip arthroplasty. IMPRESSION: Fluoroscopic spot views during right hip arthroplasty. Electronically Signed   By: Keith Rake M.D.   On: 07/13/2020 18:48   DG HIP OPERATIVE UNILAT WITH PELVIS RIGHT  Result Date: 07/13/2020 CLINICAL DATA:  Right hip arthroplasty. EXAM: DG C-ARM 1-60 MIN; OPERATIVE RIGHT HIP WITH PELVIS FLUOROSCOPY TIME:  Fluoroscopy Time:  15 seconds Radiation Exposure Index (if provided by the fluoroscopic device): 1.71 mGy Number of Acquired Spot Images: 4 COMPARISON:  Preoperative radiograph 07/13/2020 FINDINGS: Four fluoroscopic spot views obtained in the operating room of the right hip and pelvis during right hip arthroplasty. IMPRESSION: Fluoroscopic spot views during right hip arthroplasty. Electronically Signed   By: Keith Rake M.D.   On: 07/13/2020 18:48        Scheduled Meds: . aspirin EC  325 mg  Oral Q breakfast  . docusate  100 mg Oral BID  . levETIRAcetam  500 mg Oral q AM  . melatonin  10 mg Oral QHS  . pantoprazole  40 mg Oral Daily  . senna  1 tablet Oral BID  . sertraline  50 mg Oral Daily   Continuous Infusions:   LOS: 2 days    Time spent: 25 minutes    Dana Allan, MD  [facility] Pager #: [phone] 7PM-7AM contact night coverage as above

## 2020-07-15 NOTE — Progress Notes (Signed)
Subjective: 2 Days Post-Op Procedure(s) (LRB): ARTHROPLASTY BIPOLAR HIP (HEMIARTHROPLASTY) (Right) Patient reports pain as moderate.   Pt not alert but per nursing is in pain. About to receive meds.  Objective: Vital signs in last 24 hours: Temp:  [97.6 F (36.4 C)-99.6 F (37.6 C)] 99.6 F (37.6 C) (05/29 0406) Pulse Rate:  [78-95] 95 (05/29 0406) Resp:  [15-18] 15 (05/29 0406) BP: (103-113)/(40-57) 107/57 (05/29 0406) SpO2:  [96 %-100 %] 99 % (05/29 0406)  Intake/Output from previous day: 05/28 0701 - 05/29 0700 In: 105 [P.O.:105] Out: 400 [Urine:400] Intake/Output this shift: No intake/output data recorded.  Recent Labs    07/13/20 0616 07/13/20 1305 07/14/20 0226 07/15/20 0507  HGB 10.4* 9.4* 8.0* 6.7*   Recent Labs    07/14/20 0226 07/15/20 0507  WBC 17.2* 10.6*  RBC 2.57* 2.16*  HCT 25.5* 20.6*  PLT 182 144*   Recent Labs    07/14/20 0226 07/15/20 0507  NA 139 135  K 6.1* 4.2  CL 107 107  CO2 17* 23  BUN 23 31*  CREATININE 1.32* 1.27*  GLUCOSE 134* 127*  CALCIUM 8.1* 7.8*   Recent Labs    07/13/20 0820  INR 1.1    Neurologically intact ABD soft Neurovascular intact Sensation intact distally Intact pulses distally Dorsiflexion/Plantar flexion intact Incision: dressing C/D/I and no drainage No cellulitis present Compartment soft No sign of DVT  Assessment/Plan: 2 Days Post-Op Procedure(s) (LRB): ARTHROPLASTY BIPOLAR HIP (HEMIARTHROPLASTY) (Right) ASA for DVT ppx Stable from ortho standpoint  Cecilie Kicks 07/15/2020, 8:27 AM

## 2020-07-15 NOTE — Plan of Care (Addendum)
Date and time results received: 07/15/20 @0550  Critical Value: Hgb 6.7 Name of Provider Notified: Lennox Grumbles Orders Received?  Or Actions Taken?:  Patient not taking POs well. Patient extremely sleepy. Patient does respond with no to pain but not other communication. Will continue to monitor patient.   Problem: Education: Goal: Knowledge of General Education information will improve Description: Including pain rating scale, medication(s)/side effects and non-pharmacologic comfort measures Outcome: Progressing   Problem: Activity: Goal: Risk for activity intolerance will decrease Outcome: Progressing   Problem: Pain Managment: Goal: General experience of comfort will improve Outcome: Progressing   Problem: Safety: Goal: Ability to remain free from injury will improve Outcome: Progressing   Problem: Skin Integrity: Goal: Risk for impaired skin integrity will decrease Outcome: Progressing

## 2020-07-15 NOTE — Progress Notes (Signed)
[facility] notified that patient is lethargic and not safe for PO intake. New orders received will continue to monitor. Arthor Captain LPN

## 2020-07-15 NOTE — Plan of Care (Signed)
  Problem: Education: Goal: Knowledge of General Education information will improve Description: Including pain rating scale, medication(s)/side effects and non-pharmacologic comfort measures Outcome: Not Applicable   Problem: Health Behavior/Discharge Planning: Goal: Ability to manage health-related needs will improve Outcome: Progressing   Problem: Clinical Measurements: Goal: Ability to maintain clinical measurements within normal limits will improve Outcome: Progressing Goal: Will remain free from infection Outcome: Progressing Goal: Diagnostic test results will improve Outcome: Progressing Goal: Respiratory complications will improve Outcome: Progressing Goal: Cardiovascular complication will be avoided Outcome: Progressing   Problem: Activity: Goal: Risk for activity intolerance will decrease Outcome: Not Progressing   Problem: Nutrition: Goal: Adequate nutrition will be maintained Outcome: Not Progressing   Problem: Coping: Goal: Level of anxiety will decrease Outcome: Progressing   Problem: Elimination: Goal: Will not experience complications related to bowel motility Outcome: Progressing Goal: Will not experience complications related to urinary retention Outcome: Progressing   Problem: Pain Managment: Goal: General experience of comfort will improve Outcome: Progressing   Problem: Safety: Goal: Ability to remain free from injury will improve Outcome: Progressing   Problem: Skin Integrity: Goal: Risk for impaired skin integrity will decrease Outcome: Progressing

## 2020-07-16 DIAGNOSIS — N390 Urinary tract infection, site not specified: Secondary | ICD-10-CM | POA: Diagnosis not present

## 2020-07-16 DIAGNOSIS — S72001A Fracture of unspecified part of neck of right femur, initial encounter for closed fracture: Secondary | ICD-10-CM | POA: Diagnosis not present

## 2020-07-16 LAB — URINALYSIS, ROUTINE W REFLEX MICROSCOPIC
Bilirubin Urine: NEGATIVE
Glucose, UA: NEGATIVE mg/dL
Ketones, ur: NEGATIVE mg/dL
Nitrite: NEGATIVE
Protein, ur: 30 mg/dL — AB
Specific Gravity, Urine: 1.02 (ref 1.005–1.030)
WBC, UA: >50 WBC/hpf — ABNORMAL HIGH (ref 0–5)
pH: 5 (ref 5.0–8.0)

## 2020-07-16 LAB — TYPE AND SCREEN
ABO/RH(D): O POS
Antibody Screen: NEGATIVE
Unit division: 0

## 2020-07-16 LAB — BLOOD GAS, ARTERIAL
Acid-Base Excess: 0.3 mmol/L (ref 0.0–2.0)
Bicarbonate: 24.4 mmol/L (ref 20.0–28.0)
Drawn by: 236041
FIO2: 32
O2 Saturation: 98.7 %
Patient temperature: 36.4
pCO2 arterial: 37.7 mmHg (ref 32.0–48.0)
pH, Arterial: 7.423 (ref 7.350–7.450)
pO2, Arterial: 115 mmHg — ABNORMAL HIGH (ref 83.0–108.0)

## 2020-07-16 LAB — CBC
HCT: 23.5 % — ABNORMAL LOW (ref 36.0–46.0)
Hemoglobin: 7.6 g/dL — ABNORMAL LOW (ref 12.0–15.0)
MCH: 29.9 pg (ref 26.0–34.0)
MCHC: 32.3 g/dL (ref 30.0–36.0)
MCV: 92.5 fL (ref 80.0–100.0)
Platelets: 144 10*3/uL — ABNORMAL LOW (ref 150–400)
RBC: 2.54 MIL/uL — ABNORMAL LOW (ref 3.87–5.11)
RDW: 14.8 % (ref 11.5–15.5)
WBC: 9.8 10*3/uL (ref 4.0–10.5)
nRBC: 0.2 % (ref 0.0–0.2)

## 2020-07-16 LAB — BPAM RBC
Blood Product Expiration Date: 202206272359
ISSUE DATE / TIME: 202205290852
Unit Type and Rh: 5100

## 2020-07-16 MED ORDER — LEVETIRACETAM IN NACL 500 MG/100ML IV SOLN
500.0000 mg | INTRAVENOUS | Status: DC
  Administered 2020-07-16 – 2020-07-17 (×2): 500 mg via INTRAVENOUS
  Filled 2020-07-16 (×2): qty 100

## 2020-07-16 MED ORDER — SODIUM CHLORIDE 0.9 % IV SOLN
1.0000 g | INTRAVENOUS | Status: DC
  Administered 2020-07-16 – 2020-07-17 (×2): 1 g via INTRAVENOUS
  Filled 2020-07-16 (×2): qty 10

## 2020-07-16 NOTE — Plan of Care (Signed)
  Problem: Clinical Measurements: Goal: Will remain free from infection Outcome: Progressing Goal: Diagnostic test results will improve Outcome: Progressing   

## 2020-07-16 NOTE — Plan of Care (Signed)

## 2020-07-16 NOTE — Progress Notes (Signed)
PROGRESS NOTE    [name]  ERX:[id number] DOB: [birth year] DOA: 07/13/2020 PCP: Merrilee Seashore, MD  Outpatient Specialists:   Brief Narrative:  As per H&P done on admission:" [name] is a 87 y.o. 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  And dementia presents after having a unwitnessed fall.  History is obtained from daughter who is present at bedside as the patient has significant dementia.  Apparently the patient had been cared for at home by her daughter up until this month when it became too difficult for her to care for her at home.  At baseline patient is supposed to use a walker and needs assistance with ambulation, but forgets and tries get up on her own from time to time.  No known history of seizures and is on Keppra for prophylaxis.  Daughter was called around 5 AM and told that mother had fallen and was bleeding from her head.  It is not clear how long patient has been on the floor prior to being found by staff.  She is not on any blood thinners.  Prior to this patient had fallen 3 times May 4, but did not sustain any significant injuries and was not taken to the hospital.  Daughter confirms that the patient is to keep a DO NOT RESUSCITATE CODE STATUS in place.  ED Course: Upon admission to the emergency department patient was seen to be afebrile, pulse 55-64, respirations 12-25, blood pressures lowest 62/42 144/70, O2 saturation maintained on room air.  CT scan of the brain noted a large right posterior scalp laceration with small hematoma and without any intercranial abnormality appreciated.  X-rays revealed an acute right subcapital femoral neck fracture.  Labs significant for WBC 17.2, hemoglobin 10.4, glucose 160, and all other labs relatively within normal limits.  Influenza and COVID-19 screening were negative.  Orthopedics was  consulted and plan on taking the patient to surgery if medically clear.  Patient had been given a Tdap booster and he laceration of her scalp was repaired with 9 staples.  Patient also received 1 L normal saline IV fluids and morphine for pain".  07/14/2020: Patient underwent right hip hemiarthroplasty on 07/13/2020.  Patient is unable to give any history.  Patient seems quite sleepy.  07/15/2020: Patient seen.  Repeat CT head was nonrevealing.  Drop in hemoglobin noted.  Hemoglobin this morning was 6.7 g/dL.  Repeat H/H is pending.  Likely, patient will need blood transfusion.  07/16/2020: Patient seen.  No new changes.  UA suggestive of likely UTI.  Urine culture is pending.  We will start patient on IV Rocephin.  Hemoglobin today 7.6 g/dL.  Assessment & Plan:   Principal Problem:   Closed right hip fracture (HCC) Active Problems:   GERD   Atrial fibrillation (HCC)   Intracranial hemorrhage (HCC)   Leukocytosis   ABLA (acute blood loss anemia)   Scalp laceration   DNR (do not resuscitate)   Fall  Right hip fracture and right posterior scalp laceration secondary to fall:  Acute.  Patient had an unwitnessed fall at the assisted living facility.  Imaging studies noted a right subcapital femoral neck fracture and right scalp laceration with hematoma.  Laceration was repaired with staples by the ED provider.  Orthopedic was consulted.  Geriatric sensitive perioperative cardiac risk index probability of perioperative myocardial infarction or cardiac arrest is 10.1%.  Daughter is aware of the risk  and would like to proceed with surgery.  Orthopedics plan for surgery today around 5 PM. -Admit to a surgical telemetry bed -Hip fracture order set utilized -N.p.o. -Hydrocodone/morphine IV as needed pain -Appreciate orthopedic surgery consultative services, we will follow-up for any further recommendation 07/14/2020: Patient underwent right hip hemiarthroplasty.  Orthopedic team is directing postop  management.  Stat CT head to rule out intracranial bleed.  Results of CT head done yesterday is noted.  CT head and cervical spine without contrast done yesterday revealed: 1. No acute intracranial abnormalities. 2. Chronic small vessel ischemic change and brain atrophy. 3. Large right posterior scalp laceration and small subgaleal hematoma. 4. No evidence for cervical spine fracture  07/15/2020: Orthopedic team is directing postop care. 07/16/2020: For skilled nursing facility placement when stable.  Possible UTI: -UA suggestive of UTI. -Final urine cultures are still pending. -IV Rocephin 1 g every 24 hours.  Leukocytosis:  WBC elevated at 17.2.  Suspect secondary to acute fracture. -No recent urinalysis.  Will reorder.   07/15/2020: Resolved.  Transient hypotension: On admission patient's initial blood pressures were as low as 62/40, but improved after IV fluids.  Home blood pressure medications include metoprolol tartrate 25 mg daily and irbesartan 75 mg daily. -Held home blood pressure medication -Goal MAP greater than 65 -Reassess in a.m. and determine if needing to restart 07/15/2020: Blood pressure has remained stable.  Acute blood loss anemia:  Acute.  Hemoglobin 10.4 g/dL which is lower than previous of 13.2 g/dL on 4/25.  Suspect acute blood loss secondary to scalp laceration. -Continue to monitor and transfuse blood products if needed 07/06/2020: Hemoglobin today is 8 g/dL.  Continue to monitor closely. 07/15/2020: Hemoglobin is down to 6.7 g/dL today.  Awaiting repeat H/H.  Likely, patient may need blood transfusion. 07/16/2020: Hemoglobin today is 7.6 g/dL.  Continue to monitor closely.  Hypoxia:  Acute.  Patient O2 saturation dropped down to 84% after getting pain medications.  Suspect iatrogenic in nature. -Continuous pulse oximetry with nasal cannula oxygen maintain O2 saturations greater than 92% -Incentive spirometry 07/14/2020: Seems to have  resolved.  Paroxysmal atrial fibrillation: Patient appears to be in atrial fibrillation, but is rate controlled.  Not on anticoagulation due to history of intracranial hemorrhage. 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 -Continue Keppra for seizure prophylaxis   GERD -Continue p.o. Pepcid  Dementia with behavioral disturbance -Set bed alarm on -Delirium precaution  Hypomagnesemia: -Repeat magnesium level in the morning.   DVT prophylaxis: SCD. Code Status: DNR Family Communication:  Disposition Plan: Awaiting skilled nursing facility placement   Consultants:   Orthopedic surgery  Procedures:   Right hip hemiarthroplasty  Antimicrobials:   None   Subjective: No history from patient  Not in any distress.  Objective: Vitals:   07/15/20 2016 07/16/20 0516 07/16/20 0756 07/16/20 1500  BP: (!) 129/42 (!) 139/51 (!) 154/49 (!) 123/46  Pulse:  70 64 (!) 58  Resp: 16 16 17 17   Temp: (!) 97.5 F (36.4 C) (!) 97.5 F (36.4 C) 98.1 F (36.7 C) 98.8 F (37.1 C)  TempSrc: Axillary Axillary Oral Oral  SpO2: 100% 99% 100% 100%  Weight:      Height:        Intake/Output Summary (Last 24 hours) at 07/16/2020 1649 Last data filed at 07/16/2020 1537 Gross per 24 hour  Intake 244.29 ml  Output 200  ml  Net 44.29 ml   Filed Weights   07/13/20 0852  Weight: 61.2 kg    Examination:  General exam: Stable.  Not in any distress..  Patient keeps her eyes closed.   Respiratory system: Clear to auscultation.  Cardiovascular system: S1 & S2 heard Gastrointestinal system: Abdomen is nondistended, soft and nontender. No organomegaly or masses felt. Normal bowel sounds heard. Central nervous system: Patient will not cooperate.. Extremities: No leg edema  Data Reviewed: I have personally reviewed  following labs and imaging studies  CBC: Recent Labs  Lab 07/13/20 0616 07/13/20 1305 07/14/20 0226 07/15/20 0507 07/15/20 1450 07/16/20 0044  WBC 17.2*  --  17.2* 10.6*  --  9.8  NEUTROABS 12.8*  --   --  6.7  --   --   HGB 10.4* 9.4* 8.0* 6.7* 8.2* 7.6*  HCT 32.5* 29.4* 25.5* 20.6* 25.2* 23.5*  MCV 95.6  --  99.2 95.4  --  92.5  PLT 255  --  182 144*  --  357*   Basic Metabolic Panel: Recent Labs  Lab 07/13/20 0915 07/14/20 0226 07/15/20 0507  NA 138 139 135  K 4.7 6.1* 4.2  CL 109 107 107  CO2 24 17* 23  GLUCOSE 160* 134* 127*  BUN 17 23 31*  CREATININE 0.96 1.32* 1.27*  CALCIUM 8.2* 8.1* 7.8*  MG  --   --  1.6*  PHOS  --   --  2.9   GFR: Estimated Creatinine Clearance: 26.5 mL/min (A) (by C-G formula based on SCr of 1.27 mg/dL (H)). Liver Function Tests: Recent Labs  Lab 07/15/20 0507  ALBUMIN 2.4*   No results for input(s): LIPASE, AMYLASE in the last 168 hours. No results for input(s): AMMONIA in the last 168 hours. Coagulation Profile: Recent Labs  Lab 07/13/20 0820  INR 1.1   Cardiac Enzymes: No results for input(s): CKTOTAL, CKMB, CKMBINDEX, TROPONINI in the last 168 hours. BNP (last 3 results) No results for input(s): PROBNP in the last 8760 hours. HbA1C: No results for input(s): HGBA1C in the last 72 hours. CBG: Recent Labs  Lab 07/15/20 2256  GLUCAP 109*   Lipid Profile: No results for input(s): CHOL, HDL, LDLCALC, TRIG, CHOLHDL, LDLDIRECT in the last 72 hours. Thyroid Function Tests: No results for input(s): TSH, T4TOTAL, FREET4, T3FREE, THYROIDAB in the last 72 hours. Anemia Panel: No results for input(s): VITAMINB12, FOLATE, FERRITIN, TIBC, IRON, RETICCTPCT in the last 72 hours. Urine analysis:    Component Value Date/Time   COLORURINE AMBER (A) 07/16/2020 1323   APPEARANCEUR CLOUDY (A) 07/16/2020 1323   LABSPEC 1.020 07/16/2020 1323   PHURINE 5.0 07/16/2020 1323   GLUCOSEU NEGATIVE 07/16/2020 1323   HGBUR SMALL (A)  07/16/2020 1323   BILIRUBINUR NEGATIVE 07/16/2020 1323   KETONESUR NEGATIVE 07/16/2020 1323   PROTEINUR 30 (A) 07/16/2020 1323   UROBILINOGEN 0.2 08/17/2012 1829   NITRITE NEGATIVE 07/16/2020 1323   LEUKOCYTESUR LARGE (A) 07/16/2020 1323   Sepsis Labs: @LABRCNTIP (procalcitonin:4,lacticidven:4)  ) Recent Results (from the past 240 hour(s))  Resp Panel by RT-PCR (Flu A&B, Covid) Nasopharyngeal Swab     Status: None   Collection Time: 07/13/20  8:25 AM   Specimen: Nasopharyngeal Swab; Nasopharyngeal(NP) swabs in vial transport medium  Result Value Ref Range Status   SARS Coronavirus 2 by RT PCR NEGATIVE NEGATIVE Final    Comment: (NOTE) SARS-CoV-2 target nucleic acids are NOT DETECTED.  The SARS-CoV-2 RNA is generally detectable in upper respiratory specimens during the acute phase of  infection. The lowest concentration of SARS-CoV-2 viral copies this assay can detect is 138 copies/mL. A negative result does not preclude SARS-Cov-2 infection and should not be used as the sole basis for treatment or other patient management decisions. A negative result may occur with  improper specimen collection/handling, submission of specimen other than nasopharyngeal swab, presence of viral mutation(s) within the areas targeted by this assay, and inadequate number of viral copies(<138 copies/mL). A negative result must be combined with clinical observations, patient history, and epidemiological information. The expected result is Negative.  Fact Sheet for Patients:  EntrepreneurPulse.com.au  Fact Sheet for Healthcare Providers:  IncredibleEmployment.be  This test is no t yet approved or cleared by the [place] FDA and  has been authorized for detection and/or diagnosis of SARS-CoV-2 by FDA under an Emergency Use Authorization (EUA). This EUA will remain  in effect (meaning this test can be used) for the duration of the COVID-19 declaration under  Section 564(b)(1) of the Act, 21 U.S.C.section 360bbb-3(b)(1), unless the authorization is terminated  or revoked sooner.       Influenza A by PCR NEGATIVE NEGATIVE Final   Influenza B by PCR NEGATIVE NEGATIVE Final    Comment: (NOTE) The Xpert Xpress SARS-CoV-2/FLU/RSV plus assay is intended as an aid in the diagnosis of influenza from Nasopharyngeal swab specimens and should not be used as a sole basis for treatment. Nasal washings and aspirates are unacceptable for Xpert Xpress SARS-CoV-2/FLU/RSV testing.  Fact Sheet for Patients: EntrepreneurPulse.com.au  Fact Sheet for Healthcare Providers: IncredibleEmployment.be  This test is not yet approved or cleared by the [place] FDA and has been authorized for detection and/or diagnosis of SARS-CoV-2 by FDA under an Emergency Use Authorization (EUA). This EUA will remain in effect (meaning this test can be used) for the duration of the COVID-19 declaration under Section 564(b)(1) of the Act, 21 U.S.C. section 360bbb-3(b)(1), unless the authorization is terminated or revoked.  Performed at [facility], [place] [address] [place], [place] [address]          Radiology Studies: CT HEAD WO CONTRAST  Result Date: 07/14/2020 CLINICAL DATA:  Follow-up EXAM: CT HEAD WITHOUT CONTRAST TECHNIQUE: Contiguous axial images were obtained from the base of the skull through the vertex without intravenous contrast. COMPARISON:  Jul 13, 2020, March 12, 2018 FINDINGS: Brain: No evidence of acute infarction, hemorrhage, hydrocephalus, extra-axial collection or mass lesion/mass effect. Periventricular white matter hypodensities consistent with sequela of chronic microvascular ischemic disease. Global parenchymal volume loss. Remote lacunar infarctions the RIGHT basal ganglia. Vascular: Vascular calcifications. Skull: No acute fracture. Sinuses/Orbits: No acute finding. Other: Staples overlie the RIGHT  parietal scalp. Decreased conspicuity of previously described scalp hematoma. IMPRESSION: No acute intracranial abnormality. Electronically Signed   By: Valentino Saxon MD   On: 07/14/2020 18:22        Scheduled Meds: . aspirin EC  325 mg Oral Q breakfast  . docusate  100 mg Oral BID  . melatonin  10 mg Oral QHS  . pantoprazole  40 mg Oral Daily  . senna  1 tablet Oral BID  . sertraline  50 mg Oral Daily   Continuous Infusions: . cefTRIAXone (ROCEPHIN)  IV 200 mL/hr at 07/16/20 1537  . levETIRAcetam Stopped (07/16/20 0702)     LOS: 3 days    Time spent: 25 minutes    Dana Allan, MD  [facility] Pager #: [phone] 7PM-7AM contact night coverage as above

## 2020-07-16 NOTE — Evaluation (Signed)
Clinical/Bedside Swallow Evaluation Patient Details  Name: [name] MRN: [id number] Date of Birth: 14-Aug-1926  Today's Date: 07/16/2020 Time: SLP Start Time (ACUTE ONLY): 1320 SLP Stop Time (ACUTE ONLY): 1350 SLP Time Calculation (min) (ACUTE ONLY): 30 min  Past Medical History:  Past Medical History:  Diagnosis Date  . Ankle fracture   . Anxiety   . Arthritis   . Barrett's esophagus   . Chronic atrial fibrillation ([place])    a. Pt previously declined DCCV.  Marland Kitchen Coronary artery disease    a. s/p Cypher DES to LAD in 2006;  b. Last LHC 02/2008:  pLAD 30%, mLAD stent patent, pOM 50%, EF 65%;  c. Lexiscan Myoview 7/14:  Intermediate risk, dist ant and inf-lat ischemia, EF 66% - patient declined cath and elected medical therapy.  . Dementia ([place])   . Depression 2020  . Dysrhythmia   . GERD (gastroesophageal reflux disease)   . Hiatal hernia   . Hx of echocardiogram    Echocardiogram 08/17/12: EF 55-60%, MAC, mild MR, mild LAE, mild RVE, mild to moderate RAE, moderate TR, mildly increased pulmonary artery systolic pressures  . Hyperlipidemia   . Hypertension   . Mitral regurgitation    a. Mod by echo 10/2014.  . Obesity   . Pulmonary hypertension ([place])    a. Mod-severely elevated PA pressures by echo 10/2014.  Marland Kitchen QT prolongation    a. During 10/2014 admit, QTC 537m.  . Stroke [place] 12/18/2017  . Tricuspid regurgitation    a. Mod by echo 10/2014.  .Marland KitchenUTI (lower urinary tract infection) 03/2015   Past Surgical History:  Past Surgical History:  Procedure Laterality Date  . ABDOMINAL HYSTERECTOMY    . ANKLE SURGERY  2001  . BREAST EXCISIONAL BIOPSY Right 1970  . BREAST EXCISIONAL BIOPSY Right   . BREAST EXCISIONAL BIOPSY Left   . BREAST SURGERY  1970  . CESAREAN SECTION    . CORONARY ANGIOPLASTY    . ESOPHAGOGASTRODUODENOSCOPY     HPI:  87yo female presenting from assisted living on 5/27 after unwitnessed fall. This is the patient's 4th fall this month. Imaging revealed: no  acute intercranial abnormality, and R femoral neck fx. Pt is now s/p R THA on 5/27. PMH includes: dementia, HTN, HLD, afib, CAD, and intraventricular hemorrhage with residual L-sided deficits.   Assessment / Plan / Recommendation Clinical Impression  Pt participated in limited clinical swallow assessment.  Sleepy, but could be roused to answer basic questions. She required total assist for feeding.  Her daughter was at bedside, reporting minimal intake the last several days.  Dentures in container at bedside due to poor fit.  Pt accepted sips of water from a straw/cup with occasional cough after multiple sips; no cough after smaller more controlled boluses.  Applesauce was consumed with active attention/manipulation and no obvious deficits. Recommend downgrading diet to dysphagia 2, thin liquids for now - pt will need full assistance with feeding.  Hopefully as mentation improves to baseline, she can return to regular solids and resume independence with feeding.  SLP will follow briefly for safety/diet advancement. SLP Visit Diagnosis: Dysphagia, oropharyngeal phase (R13.12)    Aspiration Risk  Mild aspiration risk    Diet Recommendation   dysphagia 2, thin liquids  Medication Administration: Whole meds with puree    Other  Recommendations Oral Care Recommendations: Oral care BID   Follow up Recommendations Skilled Nursing facility      Frequency and Duration min 2x/week  1 week  Prognosis Prognosis for Safe Diet Advancement: Fair      Swallow Study   General Date of Onset: 07/13/20 HPI: 87 yo female presenting from assisted living on 5/27 after unwitnessed fall. This is the patient's 4th fall this month. Imaging revealed: no acute intercranial abnormality, and R femoral neck fx. Pt is now s/p R THA on 5/27. PMH includes: dementia, HTN, HLD, afib, CAD, and intraventricular hemorrhage with residual L-sided deficits. Type of Study: Bedside Swallow Evaluation Previous Swallow  Assessment: Nov 2019 - cognitive-based oral dysphagia at that time Diet Prior to this Study: Regular;Thin liquids Temperature Spikes Noted: No Respiratory Status: Nasal cannula History of Recent Intubation: No Behavior/Cognition: Alert;Confused Oral Cavity Assessment: Within Functional Limits Oral Care Completed by SLP: No Oral Cavity - Dentition: Edentulous;Other (Comment) (dentures ill-fitting) Self-Feeding Abilities: Needs assist Patient Positioning: Upright in bed Baseline Vocal Quality: Low vocal intensity Volitional Cough: Congested Volitional Swallow: Able to elicit    Oral/Motor/Sensory Function Overall Oral Motor/Sensory Function: Within functional limits   Ice Chips Ice chips: Within functional limits   Thin Liquid Thin Liquid: Within functional limits    Nectar Thick Nectar Thick Liquid: Not tested   Honey Thick Honey Thick Liquid: Not tested   Puree Puree: Within functional limits   Solid     Solid: Not tested      [name] 07/16/2020,1:56 PM   Estill Bamberg L. Tivis Ringer, [place] Office number [phone] Pager [phone]

## 2020-07-17 ENCOUNTER — Encounter (HOSPITAL_COMMUNITY): Payer: Self-pay | Admitting: Orthopedic Surgery

## 2020-07-17 DIAGNOSIS — I629 Nontraumatic intracranial hemorrhage, unspecified: Secondary | ICD-10-CM | POA: Diagnosis not present

## 2020-07-17 DIAGNOSIS — S72001A Fracture of unspecified part of neck of right femur, initial encounter for closed fracture: Secondary | ICD-10-CM | POA: Diagnosis not present

## 2020-07-17 DIAGNOSIS — I4891 Unspecified atrial fibrillation: Secondary | ICD-10-CM | POA: Diagnosis not present

## 2020-07-17 LAB — RENAL FUNCTION PANEL
Albumin: 2.1 g/dL — ABNORMAL LOW (ref 3.5–5.0)
Anion gap: 7 (ref 5–15)
BUN: 32 mg/dL — ABNORMAL HIGH (ref 8–23)
CO2: 23 mmol/L (ref 22–32)
Calcium: 8.2 mg/dL — ABNORMAL LOW (ref 8.9–10.3)
Chloride: 109 mmol/L (ref 98–111)
Creatinine, Ser: 0.85 mg/dL (ref 0.44–1.00)
GFR, Estimated: >60 mL/min (ref >60)
Glucose, Bld: 92 mg/dL (ref 70–99)
Phosphorus: 2.4 mg/dL — ABNORMAL LOW (ref 2.5–4.6)
Potassium: 4.3 mmol/L (ref 3.5–5.1)
Sodium: 139 mmol/L (ref 135–145)

## 2020-07-17 LAB — CBC WITH DIFFERENTIAL/PLATELET
Abs Immature Granulocytes: 0.38 10*3/uL — ABNORMAL HIGH (ref 0.00–0.07)
Basophils Absolute: 0.1 10*3/uL (ref 0.0–0.1)
Basophils Relative: 1 %
Eosinophils Absolute: 0.4 10*3/uL (ref 0.0–0.5)
Eosinophils Relative: 5 %
HCT: 22.6 % — ABNORMAL LOW (ref 36.0–46.0)
Hemoglobin: 7.3 g/dL — ABNORMAL LOW (ref 12.0–15.0)
Immature Granulocytes: 4 %
Lymphocytes Relative: 15 %
Lymphs Abs: 1.5 10*3/uL (ref 0.7–4.0)
MCH: 30.2 pg (ref 26.0–34.0)
MCHC: 32.3 g/dL (ref 30.0–36.0)
MCV: 93.4 fL (ref 80.0–100.0)
Monocytes Absolute: 1.6 10*3/uL — ABNORMAL HIGH (ref 0.1–1.0)
Monocytes Relative: 16 %
Neutro Abs: 5.9 10*3/uL (ref 1.7–7.7)
Neutrophils Relative %: 59 %
Platelets: 189 10*3/uL (ref 150–400)
RBC: 2.42 MIL/uL — ABNORMAL LOW (ref 3.87–5.11)
RDW: 14.4 % (ref 11.5–15.5)
WBC: 9.9 10*3/uL (ref 4.0–10.5)
nRBC: 0 % (ref 0.0–0.2)

## 2020-07-17 LAB — URINE CULTURE

## 2020-07-17 LAB — MAGNESIUM: Magnesium: 2 mg/dL (ref 1.7–2.4)

## 2020-07-17 MED ORDER — LEVETIRACETAM 500 MG PO TABS
500.0000 mg | ORAL_TABLET | Freq: Every day | ORAL | Status: DC
  Administered 2020-07-18 – 2020-07-19 (×2): 500 mg via ORAL
  Filled 2020-07-17 (×2): qty 1

## 2020-07-17 NOTE — Progress Notes (Signed)
Physical Therapy Treatment Patient Details Name: HELYNE Pope MRN: [id number] DOB: November 13, 1926 Today's Date: 07/17/2020    History of Present Illness The pt is a 87 yo female presenting from assisted living on 5/27 after unwitnessed fall. This is the patient's 4th fall this month. Imaging revealed: no acute intercranial abnormality, and R femoral neck fx. Pt is now s/p R THA on 5/27. PMH includes: dementia, HTN, HLD, afib, CAD, and intraventricular hemorrhage with residual L-sided deficits.    PT Comments    Pt supine in bed on arrival this session.  Pt continues to require total +2 assistance but able to tolerate movement OOB to chair this session.  Continue to recommend venue listed below.     Follow Up Recommendations  Supervision/Assistance - 24 hour (HHPT at ALF)     Equipment Recommendations  None recommended by PT    Recommendations for Other Services       Precautions / Restrictions Precautions Precautions: Fall Precaution Comments: dementia and L sided residual weakness at baseline Restrictions Weight Bearing Restrictions: Yes RLE Weight Bearing: Weight bearing as tolerated    Mobility  Bed Mobility Overal bed mobility: Needs Assistance Bed Mobility: Supine to Sit;Sit to Supine     Supine to sit: Total assist;+2 for physical assistance     General bed mobility comments: TOTAL +2  to move LEs and elevate trunk into sitting via helicopter transfer.  Pt required cues for weight shifting forward and gazing  forward in seated position.    Transfers Overall transfer level: Needs assistance Equipment used: None Transfers: Squat Pivot Transfers     Squat pivot transfers: From elevated surface;Total assist;+2 safety/equipment     General transfer comment: Performed face to face transfer moving hips from high surface to a lower surface.  Pt with little to no effort to assist.  Ambulation/Gait                 Stairs             Wheelchair  Mobility    Modified Rankin (Stroke Patients Only)       Balance Overall balance assessment: Needs assistance   Sitting balance-Leahy Scale: Poor   Postural control: Posterior lean   Standing balance-Leahy Scale: Zero                              Cognition Arousal/Alertness: Lethargic Behavior During Therapy: Flat affect Overall Cognitive Status: No family/caregiver present to determine baseline cognitive functioning                                 General Comments: Pt able to open her eyes after PTA removed crusted residue from her eyes that was causing her eyes to stay sealed shut.  Dementia at baseline.      Exercises      General Comments        Pertinent Vitals/Pain Pain Assessment: Faces Faces Pain Scale: Hurts little more Pain Location: she said "I don't know" Pain Descriptors / Indicators: Grimacing;Guarding Pain Intervention(s): Monitored during session;Repositioned    Home Living                      Prior Function            PT Goals (current goals can now be found in the care plan section) Acute Rehab PT Goals Patient  Stated Goal: none stated Potential to Achieve Goals: Fair Progress towards PT goals: Progressing toward goals    Frequency    Min 2X/week      PT Plan Current plan remains appropriate    Co-evaluation   Reason for Co-Treatment: Complexity of the patient's impairments (multi-system involvement) PT goals addressed during session: Mobility/safety with mobility OT goals addressed during session: ADL's and self-care      AM-PAC PT "6 Clicks" Mobility   Outcome Measure  Help needed turning from your back to your side while in a flat bed without using bedrails?: Total Help needed moving from lying on your back to sitting on the side of a flat bed without using bedrails?: Total Help needed moving to and from a bed to a chair (including a wheelchair)?: Total Help needed standing up from a  chair using your arms (e.g., wheelchair or bedside chair)?: Total Help needed to walk in hospital room?: Total Help needed climbing 3-5 steps with a railing? : Total 6 Click Score: 6    End of Session Equipment Utilized During Treatment: Oxygen Activity Tolerance: Patient limited by lethargy Patient left: in chair;with call bell/phone within reach;with chair alarm set Nurse Communication: Mobility status PT Visit Diagnosis: Other abnormalities of gait and mobility (R26.89);Muscle weakness (generalized) (M62.81)     Time: 2233-6122 PT Time Calculation (min) (ACUTE ONLY): 32 min  Charges:  $Therapeutic Activity: 8-22 mins                     Erasmo Leventhal , PTA Acute Rehabilitation Services Pager [phone] Office [phone]     Martavis Gurney [name] 07/17/2020, 1:52 PM

## 2020-07-17 NOTE — Progress Notes (Signed)
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   Patient was admitted with unwitnessed fall and CT scan of the brain revealed a large right posterior scalp laceration with small hematoma, without any intercranial abnormality. X-rays revealed acute right subcapital femoral neck fracture.  Patient underwent right hip hemiarthroplasty on 07/13/2020.  UA was suggestive of likely UTI, however, urine culture grew multiple organisms.  Urine culture will be repeated.  Patient is currently on IV Rocephin.  Patient be discharged to skilled nursing facility.  Patient is stable for discharge.  Assessment & Plan:   Principal Problem:   Closed right hip fracture (HCC) Active Problems:   GERD   Atrial fibrillation (HCC)   Intracranial hemorrhage (HCC)   Leukocytosis   ABLA (acute blood loss anemia)   Scalp laceration   DNR (do not resuscitate)   Fall  Right hip fracture and right posterior scalp laceration secondary to fall:  -Acute.   -Patient had an unwitnessed fall at the assisted living facility.   -Imaging studies results are as documented above.   -Laceration was repaired with staples by the ED provider.   -Patient has undergone right hip hemiarthroplasty. -Orthopedic team is directing care. -Patient is stable for discharge to skilled nursing facility.    Possible UTI: -UA was suggestive of UTI. -Urine culture grew multiple organisms. -We will repeat urine culture. -Continue IV Rocephin 1 g every 24 hours.   Leukocytosis:  WBC was elevated at 17.2.   Leukocytosis has resolved.    Transient hypotension: -On admission  patient's initial blood pressures were as low as 62/40, but improved after IV fluids.  -Home blood pressure medications have been on hold (metoprolol tartrate 25 mg daily and irbesartan 75 mg daily). -BP has improved, however, diastolic blood pressure remains low.  Acute blood loss anemia:  -Acute.   -On presentation, hemoglobin was 10.4 g/dL (down from 13.2 g/dL on 06/10/2020).  -Hemoglobin continues to drop and, dropped as low as 6.7 g/dL. -Patient has been transfused with packed red blood cells. -Hemoglobin today was 7.3 g/dL. -Continue to monitor H/H closely.  Hypoxia:  -Acute.   -Continuous pulse oximetry with nasal cannula oxygen maintain O2 saturations greater than 92% -Incentive spirometry  Paroxysmal atrial fibrillation: -Patient is not on anticoagulation. -Heart rate is controlled.   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 -Anticoagulation has been on hold.   -Continue Keppra for seizure prophylaxis   GERD -Continue p.o. Pepcid  Dementia with behavioral disturbance -Set bed alarm on -Delirium precaution  Hypomagnesemia: -Resolved.  Magnesium today was 2.    DVT prophylaxis: SCD. Code Status: DNR Family Communication:  Disposition Plan: Awaiting skilled nursing facility placement   Consultants:   Orthopedic surgery  Procedures:   Right hip hemiarthroplasty  Antimicrobials:   None   Subjective: No history from patient  Not in any distress.  Objective: Vitals:   07/16/20 1900 07/17/20 0419 07/17/20 0735 07/17/20 1453  BP: (!) 134/47 (!) 142/47 (!) 147/50 (!) 130/40  Pulse: 64 (!) 58 61 (!) 59  Resp:  14 17 16   Temp: 99.1 F (37.3 C) 98.4 F (36.9 C)  98.3 F (36.8 C) (!) 97.2 F (36.2 C)  TempSrc: Axillary Oral Oral Oral  SpO2: 99% 100% 100% 100%  Weight:      Height:         Intake/Output Summary (Last 24 hours) at 07/17/2020 1736 Last data filed at 07/17/2020 1248 Gross per 24 hour  Intake 240 ml  Output --  Net 240 ml   Filed Weights   07/13/20 0852  Weight: 61.2 kg    Examination:  General exam: Stable.  Not in any distress..  Patient keeps her eyes closed.   Respiratory system: Clear to auscultation.  Cardiovascular system: S1 & S2 heard Gastrointestinal system: Abdomen is nondistended, soft and nontender. No organomegaly or masses felt. Normal bowel sounds heard. Central nervous system: Patient will not cooperate.. Extremities: No leg edema  Data Reviewed: I have personally reviewed following labs and imaging studies  CBC: Recent Labs  Lab 07/13/20 0616 07/13/20 1305 07/14/20 0226 07/15/20 0507 07/15/20 1450 07/16/20 0044 07/17/20 0123  WBC 17.2*  --  17.2* 10.6*  --  9.8 9.9  NEUTROABS 12.8*  --   --  6.7  --   --  5.9  HGB 10.4*   < > 8.0* 6.7* 8.2* 7.6* 7.3*  HCT 32.5*   < > 25.5* 20.6* 25.2* 23.5* 22.6*  MCV 95.6  --  99.2 95.4  --  92.5 93.4  PLT 255  --  182 144*  --  144* 189   < > = values in this interval not displayed.   Basic Metabolic Panel: Recent Labs  Lab 07/13/20 0915 07/14/20 0226 07/15/20 0507 07/17/20 0123  NA 138 139 135 139  K 4.7 6.1* 4.2 4.3  CL 109 107 107 109  CO2 24 17* 23 23  GLUCOSE 160* 134* 127* 92  BUN 17 23 31* 32*  CREATININE 0.96 1.32* 1.27* 0.85  CALCIUM 8.2* 8.1* 7.8* 8.2*  MG  --   --  1.6* 2.0  PHOS  --   --  2.9 2.4*   GFR: Estimated Creatinine Clearance: 39.5 mL/min (by C-G formula based on SCr of 0.85 mg/dL). Liver Function Tests: Recent Labs  Lab 07/15/20 0507 07/17/20 0123  ALBUMIN 2.4* 2.1*   No results for input(s): LIPASE, AMYLASE in the last 168 hours. No results for input(s): AMMONIA in the last 168 hours. Coagulation Profile: Recent Labs  Lab 07/13/20 0820  INR 1.1   Cardiac Enzymes: No results for input(s): CKTOTAL, CKMB, CKMBINDEX, TROPONINI in the  last 168 hours. BNP (last 3 results) No results for input(s): PROBNP in the last 8760 hours. HbA1C: No results for input(s): HGBA1C in the last 72 hours. CBG: Recent Labs  Lab 07/15/20 2256  GLUCAP 109*   Lipid Profile: No results for input(s): CHOL, HDL, LDLCALC, TRIG, CHOLHDL, LDLDIRECT in the last 72 hours. Thyroid Function Tests: No results for input(s): TSH, T4TOTAL, FREET4, T3FREE, THYROIDAB in the last 72 hours. Anemia Panel: No results for input(s): VITAMINB12, FOLATE, FERRITIN, TIBC, IRON, RETICCTPCT in the last 72 hours. Urine analysis:    Component Value Date/Time   COLORURINE AMBER (A) 07/16/2020 1323   APPEARANCEUR CLOUDY (A) 07/16/2020 1323   LABSPEC 1.020 07/16/2020 1323   PHURINE 5.0 07/16/2020 1323   GLUCOSEU NEGATIVE 07/16/2020 1323   HGBUR SMALL (A) 07/16/2020 1323   [place] 07/16/2020 [place] 07/16/2020 1323   PROTEINUR 30 (A) 07/16/2020 1323   UROBILINOGEN 0.2 08/17/2012 1829   NITRITE NEGATIVE 07/16/2020 1323   LEUKOCYTESUR  LARGE (A) 07/16/2020 1323   Sepsis Labs: @LABRCNTIP (procalcitonin:4,lacticidven:4)  ) Recent Results (from the past 240 hour(s))  Resp Panel by RT-PCR (Flu A&B, Covid) Nasopharyngeal Swab     Status: None   Collection Time: 07/13/20  8:25 AM   Specimen: Nasopharyngeal Swab; Nasopharyngeal(NP) swabs in vial transport medium  Result Value Ref Range Status   SARS Coronavirus 2 by RT PCR NEGATIVE NEGATIVE Final    Comment: (NOTE) SARS-CoV-2 target nucleic acids are NOT DETECTED.  The SARS-CoV-2 RNA is generally detectable in upper respiratory specimens during the acute phase of infection. The lowest concentration of SARS-CoV-2 viral copies this assay can detect is 138 copies/mL. A negative result does not preclude SARS-Cov-2 infection and should not be used as the sole basis for treatment or other patient management decisions. A negative result may occur with  improper specimen  collection/handling, submission of specimen other than nasopharyngeal swab, presence of viral mutation(s) within the areas targeted by this assay, and inadequate number of viral copies(<138 copies/mL). A negative result must be combined with clinical observations, patient history, and epidemiological information. The expected result is Negative.  Fact Sheet for Patients:  EntrepreneurPulse.com.au  Fact Sheet for Healthcare Providers:  IncredibleEmployment.be  This test is no t yet approved or cleared by the [place] FDA and  has been authorized for detection and/or diagnosis of SARS-CoV-2 by FDA under an Emergency Use Authorization (EUA). This EUA will remain  in effect (meaning this test can be used) for the duration of the COVID-19 declaration under Section 564(b)(1) of the Act, 21 U.S.C.section 360bbb-3(b)(1), unless the authorization is terminated  or revoked sooner.       Influenza A by PCR NEGATIVE NEGATIVE Final   Influenza B by PCR NEGATIVE NEGATIVE Final    Comment: (NOTE) The Xpert Xpress SARS-CoV-2/FLU/RSV plus assay is intended as an aid in the diagnosis of influenza from Nasopharyngeal swab specimens and should not be used as a sole basis for treatment. Nasal washings and aspirates are unacceptable for Xpert Xpress SARS-CoV-2/FLU/RSV testing.  Fact Sheet for Patients: EntrepreneurPulse.com.au  Fact Sheet for Healthcare Providers: IncredibleEmployment.be  This test is not yet approved or cleared by the [place] FDA and has been authorized for detection and/or diagnosis of SARS-CoV-2 by FDA under an Emergency Use Authorization (EUA). This EUA will remain in effect (meaning this test can be used) for the duration of the COVID-19 declaration under Section 564(b)(1) of the Act, 21 U.S.C. section 360bbb-3(b)(1), unless the authorization is terminated or revoked.  Performed at [facility], [place] [address] [place], [place] [address]   Culture, Urine     Status: Abnormal   Collection Time: 07/14/20  4:59 PM   Specimen: Urine, Random  Result Value Ref Range Status   Specimen Description URINE, RANDOM  Final   Special Requests   Final    NONE Performed at [facility], [place] [address], [place], [place] [address]    Culture MULTIPLE SPECIES PRESENT, SUGGEST RECOLLECTION (A)  Final   Report Status 07/17/2020 FINAL  Final         Radiology Studies: No results found.      Scheduled Meds: . aspirin EC  325 mg Oral Q breakfast  . docusate  100 mg Oral BID  . [START ON 07/18/2020] levETIRAcetam  500 mg Oral Daily  . melatonin  10 mg Oral QHS  . pantoprazole  40 mg Oral Daily  . senna  1 tablet Oral BID  . sertraline  50 mg Oral Daily   Continuous Infusions: . cefTRIAXone (ROCEPHIN)  IV 1 g (07/17/20 1604)     LOS: 4 days    Time spent: 25 minutes    Dana Allan, MD  [facility] Pager #: [phone] 7PM-7AM contact night coverage as above

## 2020-07-17 NOTE — Progress Notes (Signed)
Occupational Therapy Treatment Patient Details Name: [name] MRN: [id number] DOB: 1926/12/14 Today's Date: 07/17/2020    History of present illness The pt is a 87 yo female presenting from assisted living on 5/27 after unwitnessed fall. This is the patient's 4th fall this month. Imaging revealed: no acute intercranial abnormality, and R femoral neck fx. Pt is now s/p R THA on 5/27. PMH includes: dementia, HTN, HLD, afib, CAD, and intraventricular hemorrhage with residual L-sided deficits.   OT comments  [name] is doing well, with dementia at baseline and pain in L hip with any movement. Pt continues to be max/total A +2 for all bed mobility and functional tranfers (squat pivot bed>chair). She participates very little and requires simple step by step commands for any movement of BUE. Therapists dependently completed bathing at bed level, pt able to assist in rear pericare by balancing in sidelying position. Pt continues to benefit from acute OT to progress functional mobility and participation in ADLs. D/c recommendation remains appropriate.   Follow Up Recommendations  SNF;Supervision/Assistance - 24 hour    Equipment Recommendations  Other (comment)       Precautions / Restrictions Precautions Precautions: Fall Precaution Comments: dementia and L sided residual weakness at baseline Restrictions Weight Bearing Restrictions: Yes RLE Weight Bearing: Weight bearing as tolerated       Mobility Bed Mobility Overal bed mobility: Needs Assistance Bed Mobility: Rolling;Supine to Sit Rolling: Max assist;+2 for physical assistance;+2 for safety/equipment   Supine to sit: Max assist;+2 for physical assistance;+2 for safety/equipment     General bed mobility comments: +2  to move LEs and elevate trunk into sitting via helicopter transfer.  Pt required cues for weight shifting forward and gazing  forward in seated position.    Transfers Overall transfer level: Needs  assistance Equipment used: None Transfers: Squat Pivot Transfers     Squat pivot transfers: From elevated surface;Total assist;+2 safety/equipment     General transfer comment: Performed face to face transfer moving hips from high surface to a lower surface.  Pt with little to no effort to assist.    Balance Overall balance assessment: Needs assistance Sitting-balance support:  (trunk supported) Sitting balance-Leahy Scale: Poor   Postural control: Posterior lean;Left lateral lean   Standing balance-Leahy Scale: Zero                             ADL either performed or assessed with clinical judgement   ADL Overall ADL's : Needs assistance/impaired             Lower Body Bathing: Total assistance;Bed level;+2 for physical assistance;+2 for safety/equipment Lower Body Bathing Details (indicate cue type and reason): pt able to assist in bed level by balancing in a sidelying position             Toileting- Clothing Manipulation and Hygiene: Total assistance;+2 for physical assistance;+2 for safety/equipment Toileting - Clothing Manipulation Details (indicate cue type and reason): therapist dependently completed pericare after uriniating at bed level     Functional mobility during ADLs: Total assistance General ADL Comments: Pt was max A +2 for bed mobility, once sitting EOB pt required trunk support for posterior and L lateral lean               Cognition Arousal/Alertness: Lethargic Behavior During Therapy: Flat affect Overall Cognitive Status: No family/caregiver present to determine baseline cognitive functioning          General Comments:  PTA dependently removed gunck from pts eyes, pt with better ability to open eyes and stay alert after eyes were cleaned. Dementia at baseline.              General Comments Pt soiled upon arrival, session focused on ADLs, bed mobility and functional transfer to the chair; pt with little to no participation  in functional mobility and required step by step verbal command to assist in any movement    Pertinent Vitals/ Pain       Pain Assessment: Faces Faces Pain Scale: Hurts little more Pain Location: she said "I don't know" - seemingly L hip with movement Pain Descriptors / Indicators: Grimacing;Guarding Pain Intervention(s): Limited activity within patient's tolerance;Monitored during session         Frequency  Min 2X/week        Progress Toward Goals  OT Goals(current goals can now be found in the care plan section)  Progress towards OT goals: Progressing toward goals  Acute Rehab OT Goals Patient Stated Goal: none stated OT Goal Formulation: Patient unable to participate in goal setting Time For Goal Achievement: 07/28/20 Potential to Achieve Goals: Fair ADL Goals Pt Will Perform Eating: with set-up;with supervision;bed level Pt Will Perform Grooming: with set-up;with supervision;bed level Additional ADL Goal #1: Pt will sit EOB with min support for 3+ mins to prepare for seated ADL's.  Plan Discharge plan remains appropriate    Co-evaluation    PT/OT/SLP Co-Evaluation/Treatment: Yes Reason for Co-Treatment: Complexity of the patient's impairments (multi-system involvement);Necessary to address cognition/behavior during functional activity;For patient/therapist safety;To address functional/ADL transfers PT goals addressed during session: Mobility/safety with mobility OT goals addressed during session: ADL's and self-care;Other (comment) (funcational transfers)      AM-PAC OT "6 Clicks" Daily Activity     Outcome Measure   Help from another person eating meals?: A Lot Help from another person taking care of personal grooming?: A Lot Help from another person toileting, which includes using toliet, bedpan, or urinal?: Total Help from another person bathing (including washing, rinsing, drying)?: Total Help from another person to put on and taking off regular upper body  clothing?: A Lot Help from another person to put on and taking off regular lower body clothing?: Total 6 Click Score: 9    End of Session    OT Visit Diagnosis: Unsteadiness on feet (R26.81);Muscle weakness (generalized) (M62.81);History of falling (Z91.81);Repeated falls (R29.6)   Activity Tolerance Patient limited by lethargy   Patient Left in chair;with call bell/phone within reach;with chair alarm set   Nurse Communication Mobility status;Other (comment)        Time: 1307 [phone] OT Time Calculation (min): 30 min  Charges: OT General Charges $OT Visit: 1 Visit OT Treatments $Self Care/Home Management : 8-22 mins    Jhania Etherington A Taishaun Levels 07/17/2020, 2:12 PM

## 2020-07-17 NOTE — NC FL2 (Signed)
[place] LEVEL OF CARE SCREENING TOOL     IDENTIFICATION  Patient Name: [name] Birthdate: 10-May-1926 Sex: female Admission Date (Current Location): 07/13/2020  [facility] and [place] Number:  Herbalist and Address:  [facility], [place] [address], [place], [place] [address]      Provider Number: [id number]  Attending Physician Name and Address:  Bonnell Public, MD  Relative Name and Phone Number:       Current Level of Care: Hospital Recommended Level of Care: [place] Prior Approval Number:    Date Approved/Denied:   PASRR Number: [id number]  Discharge Plan: SNF    Current Diagnoses: Patient Active Problem List   Diagnosis Date Noted  . Closed right hip fracture ([place]) 07/13/2020  . Scalp laceration 07/13/2020  . DNR (do not resuscitate) 07/13/2020  . Fall 07/13/2020  . Pneumothorax on left 06/30/2019  . Lewy body dementia ([place]) 05/18/2019  . Recurrent UTI--E coli/Pseudomonas 01/18/2018  . Orthostasis 01/18/2018  . Benign essential HTN   . Cerebral edema (HCC)   . Hypomagnesemia   . Dysphagia, post-stroke   . Lethargy   . ABLA (acute blood loss anemia)   . Hypoalbuminemia due to protein-calorie malnutrition ([place])   . History of Barrett's esophagus   . Urinary retention   . Seizure prophylaxis   . E. coli UTI   . Intraventricular hemorrhage ([place])   . Urinary tract infection without hematuria   . Coronary artery disease involving native coronary artery of native heart without angina pectoris   . Pulmonary HTN ([place])   . Leukocytosis   . Intracranial hemorrhage ([place]) 12/18/2017  . Syncope 04/10/2015  . Sepsis secondary to UTI ([place]) 04/10/2015  . URI (upper respiratory infection) 04/10/2015  . UTI (lower urinary tract infection) 04/10/2015  . QT prolongation 10/12/2014  . Headache 09/02/2012  . Tricuspid valve regurgitation, moderate 08/18/2012  . Hypokalemia 08/16/2012  .  Hyponatremia 08/16/2012  . Atrial fibrillation ([place])   . Hypertension   . Anxiety   . Mixed hyperlipidemia 09/11/2009  . GERD 08/30/2008  . CHEST PAIN 08/30/2008  . Coronary atherosclerosis 04/04/2008  . Syncope and collapse 04/04/2008    Orientation RESPIRATION BLADDER Height & Weight     Self (memory deficits)  Normal External catheter Weight: 61.2 kg Height:  5\' 6"  (167.6 cm)  BEHAVIORAL SYMPTOMS/MOOD NEUROLOGICAL BOWEL NUTRITION STATUS      Continent Diet (refer to d/c summary)  AMBULATORY STATUS COMMUNICATION OF NEEDS Skin   Extensive Assist Verbally Surgical wounds (s/p R THA on 5/27)                       Personal Care Assistance Level of Assistance  Bathing,Dressing,Feeding Bathing Assistance: Maximum assistance Feeding assistance: Limited assistance Dressing Assistance: Maximum assistance     Functional Limitations Info  Sight,Hearing,Speech Sight Info: Impaired Hearing Info: Impaired (HOH) Speech Info: Adequate    SPECIAL CARE FACTORS FREQUENCY  PT (By licensed PT),OT (By licensed OT)     PT Frequency: 5x/week, evaluate and treat OT Frequency: 5x/week, evaluate and treat            Contractures Contractures Info: Not present    Additional Factors Info  Code Status,Allergies Code Status Info: full code Allergies Info: Amlodipine, Methyldopa, Shellfish-derived Products, Tape, Actonel (Risedronate Sodium), Bactrim (Sulfamethoxazole-trimethoprim), Ciprofloxacin Hcl, Gas-x (Simethicone), Hydralazine Hcl, Lactose Intolerance (Gi), Lisinopril, Milk-related Compounds, Other, Oxycodone Hcl, Pneumococcal Vaccines, Valsartan, Cozaar (Losartan), Digitek (Digoxin),  Felodipine, Hctz (Hydrochlorothiazide), Simvastatin, Spironolactone           Current Medications (07/17/2020):  This is the current hospital active medication list Current Facility-Administered Medications  Medication Dose Route Frequency Provider Last Rate Last Admin  . aspirin EC tablet 325  mg  325 mg Oral Q breakfast Rod Can, MD   325 mg at 07/17/20 0826  . cefTRIAXone (ROCEPHIN) 1 g in sodium chloride 0.9 % 100 mL IVPB  1 g Intravenous Q24H Dana Allan I, MD 200 mL/hr at 07/17/20 1604 1 g at 07/17/20 1604  . docusate (COLACE) 50 MG/5ML liquid 100 mg  100 mg Oral BID Dana Allan I, MD   100 mg at 07/17/20 0826  . HYDROcodone-acetaminophen (NORCO/VICODIN) 5-325 MG per tablet 1-2 tablet  1-2 tablet Oral Q6H PRN Rod Can, MD      . Derrill Memo ON 07/18/2020] levETIRAcetam (KEPPRA) tablet 500 mg  500 mg Oral Daily Dana Allan I, MD      . melatonin tablet 10 mg  10 mg Oral QHS Swinteck, Aaron Edelman, MD      . menthol-cetylpyridinium (CEPACOL) lozenge 3 mg  1 lozenge Oral PRN Swinteck, Aaron Edelman, MD       Or  . phenol (CHLORASEPTIC) mouth spray 1 spray  1 spray Mouth/Throat PRN Swinteck, Aaron Edelman, MD      . morphine 2 MG/ML injection 0.5 mg  0.5 mg Intravenous Q2H PRN Rod Can, MD   0.5 mg at 07/16/20 0007  . ondansetron (ZOFRAN) tablet 4 mg  4 mg Oral Q6H PRN Swinteck, Aaron Edelman, MD       Or  . ondansetron (ZOFRAN) injection 4 mg  4 mg Intravenous Q6H PRN Swinteck, Aaron Edelman, MD      . pantoprazole (PROTONIX) EC tablet 40 mg  40 mg Oral Daily Pham, Minh Q, RPH-CPP   40 mg at 07/17/20 0826  . senna (SENOKOT) tablet 8.6 mg  1 tablet Oral BID Rod Can, MD   8.6 mg at 07/17/20 0825  . sertraline (ZOLOFT) tablet 50 mg  50 mg Oral Daily Rod Can, MD   50 mg at 07/17/20 0825     Discharge Medications: Please see discharge summary for a list of discharge medications.  Relevant Imaging Results:  Relevant Lab Results:   Additional Information [phone]  Sharin Mons, RN

## 2020-07-17 NOTE — Plan of Care (Signed)
  Problem: Clinical Measurements: Goal: Ability to maintain clinical measurements within normal limits will improve Outcome: Progressing   Problem: Pain Managment: Goal: General experience of comfort will improve Outcome: Progressing   Problem: Health Behavior/Discharge Planning: Goal: Ability to manage health-related needs will improve Outcome: Not Progressing   Problem: Activity: Goal: Risk for activity intolerance will decrease Outcome: Not Progressing   Problem: Nutrition: Goal: Adequate nutrition will be maintained Outcome: Not Progressing

## 2020-07-17 NOTE — Plan of Care (Signed)
  Problem: Clinical Measurements: Goal: Will remain free from infection Outcome: Progressing   Problem: Activity: Goal: Risk for activity intolerance will decrease Outcome: Progressing   

## 2020-07-17 NOTE — TOC Initial Note (Addendum)
Transition of Care [facility]) - Initial/Assessment Note    Patient Details  Name: [name] MRN: [id number] Date of Birth: 11-08-1926  Transition of Care [facility]) CM/SW Contact:    Sonya Mons, RN Phone Number: 07/17/2020, 5:43 PM  Clinical Narrative:                 - s/p R THA on 5/27. PMH includes: dementia, HTN, HLD, afib, CAD, and intraventricular hemorrhage with residual L-sided deficits. From home with daughter [name].  RNCM received consult for possible SNF placement at time of discharge.  Pt alert and oriented x1. RNCM spoke with patient's daughter [name]  regarding PT recommendation of SNF placement at time of discharge. [name]  reported that she is currently unable to care for patient at their home given patient's current physical needs and fall risk. [name]  expressed understanding of PT recommendation and is agreeable to SNF placement at time of discharge for pt. Reports preference for [facility].RNCM discussed insurance authorization process and will provide Medicare SNF ratings list  No further questions reported at this time. RNCM to continue to follow and assist with discharge planning needs.  Pt fully vaccinated, no booster.  SNF referrals faxed out. Expected Discharge Plan: Skilled Nursing Facility Barriers to Discharge: Continued Medical Work up,No SNF bed   Patient Goals and CMS Choice        Expected Discharge Plan and Services Expected Discharge Plan: [place]                                              Prior Living Arrangements/Services                       Activities of Daily Living Home Assistive Devices/Equipment: Environmental consultant (specify type),Wheelchair,Bedside commode/3-in-1 ADL Screening (condition at time of admission) Patient's cognitive ability adequate to safely complete daily activities?: No Is the patient deaf or have difficulty hearing?: No Does the patient have difficulty seeing, even when wearing  glasses/contacts?: Yes Does the patient have difficulty concentrating, remembering, or making decisions?: Yes Patient able to express need for assistance with ADLs?: No Does the patient have difficulty dressing or bathing?: Yes Independently performs ADLs?: No Communication: Independent Dressing (OT): Dependent Is this a change from baseline?: Pre-admission baseline Grooming: Dependent Is this a change from baseline?: Pre-admission baseline Feeding: Needs assistance Is this a change from baseline?: Pre-admission baseline Bathing: Dependent Is this a change from baseline?: Pre-admission baseline Toileting: Dependent Is this a change from baseline?: Pre-admission baseline In/Out Bed: Dependent Is this a change from baseline?: Pre-admission baseline Walks in Home: Dependent Is this a change from baseline?: Pre-admission baseline Does the patient have difficulty walking or climbing stairs?: Yes Weakness of Legs: Left Weakness of Arms/Hands: Left  Permission Sought/Granted                  Emotional Assessment              Admission diagnosis:  Dementia ([place]) [F03.90] Closed right hip fracture ([place]) [S72.001A] Closed head injury, initial encounter [S09.90XA] Closed subcapital fracture of right femur, initial encounter ([place]) [S72.011A] Fall, initial encounter [W19.XXXA] Laceration of scalp, initial encounter [S01.01XA] Unwitnessed fall [R29.6] Patient Active Problem List   Diagnosis Date Noted  . Closed right hip fracture ([place]) 07/13/2020  . Scalp laceration 07/13/2020  . DNR (do not resuscitate)  07/13/2020  . Fall 07/13/2020  . Pneumothorax on left 06/30/2019  . Lewy body dementia ([place]) 05/18/2019  . Recurrent UTI--E coli/Pseudomonas 01/18/2018  . Orthostasis 01/18/2018  . Benign essential HTN   . Cerebral edema (HCC)   . Hypomagnesemia   . Dysphagia, post-stroke   . Lethargy   . ABLA (acute blood loss anemia)   . Hypoalbuminemia due to protein-calorie  malnutrition ([place])   . History of Barrett's esophagus   . Urinary retention   . Seizure prophylaxis   . E. coli UTI   . Intraventricular hemorrhage ([place])   . Urinary tract infection without hematuria   . Coronary artery disease involving native coronary artery of native heart without angina pectoris   . Pulmonary HTN ([place])   . Leukocytosis   . Intracranial hemorrhage ([place]) 12/18/2017  . Syncope 04/10/2015  . Sepsis secondary to UTI ([place]) 04/10/2015  . URI (upper respiratory infection) 04/10/2015  . UTI (lower urinary tract infection) 04/10/2015  . QT prolongation 10/12/2014  . Headache 09/02/2012  . Tricuspid valve regurgitation, moderate 08/18/2012  . Hypokalemia 08/16/2012  . Hyponatremia 08/16/2012  . Atrial fibrillation ([place])   . Hypertension   . Anxiety   . Mixed hyperlipidemia 09/11/2009  . GERD 08/30/2008  . CHEST PAIN 08/30/2008  . Coronary atherosclerosis 04/04/2008  . Syncope and collapse 04/04/2008   PCP:  Sonya Seashore, MD Pharmacy:   [facility] [address] [place] [address] Phone: [phone] Fax: [phone]     Social Determinants of Health (SDOH) Interventions    Readmission Risk Interventions No flowsheet data found.

## 2020-07-17 NOTE — Progress Notes (Signed)
  Speech Language Pathology Treatment: Dysphagia  Patient Details Name: [name] MRN: [id number] DOB: 1926/11/19 Today's Date: 07/17/2020 Time: 5993-5701 SLP Time Calculation (min) (ACUTE ONLY): 23 min  Assessment / Plan / Recommendation Clinical Impression  Concerns for aspiration were exhibited. Pt appears weak, deconditioned, and in mild discomfort. Smaller, controlled cup sips thin were unremarkable. However consistent coughing when sips followed bites of Dys 2 texture or with multiple straw sips. Fatigue evident after mastication of sausage mixed with eggs. Currently, aspiration could more more menacing with her decreased reserve and ability to protect her airway, therefore, downgrading to nectar thick and puree. Trials of upgraded textures will be initiated when/if appropriate or if pt/family wishes to accept risks for comfort given age and declining health. Small sips, crush pills, check oral cavity and full assist with meals.   HPI        SLP Plan  Continue with current plan of care       Recommendations  Diet recommendations: Dysphagia 1 (puree);Nectar-thick liquid Liquids provided via: Cup;Straw Medication Administration: Crushed with puree Supervision: Staff to assist with self feeding;Full supervision/cueing for compensatory strategies Compensations: Slow rate;Small sips/bites;Lingual sweep for clearance of pocketing Postural Changes and/or Swallow Maneuvers: Seated upright 90 degrees                Oral Care Recommendations: Oral care BID Follow up Recommendations: Skilled Nursing facility SLP Visit Diagnosis: Dysphagia, oropharyngeal phase (R13.12) Plan: Continue with current plan of care       GO                [name] 07/17/2020, 10:42 AM

## 2020-07-18 DIAGNOSIS — S72011A Unspecified intracapsular fracture of right femur, initial encounter for closed fracture: Principal | ICD-10-CM

## 2020-07-18 DIAGNOSIS — I480 Paroxysmal atrial fibrillation: Secondary | ICD-10-CM | POA: Diagnosis not present

## 2020-07-18 DIAGNOSIS — D62 Acute posthemorrhagic anemia: Secondary | ICD-10-CM | POA: Diagnosis not present

## 2020-07-18 DIAGNOSIS — S0990XA Unspecified injury of head, initial encounter: Secondary | ICD-10-CM

## 2020-07-18 DIAGNOSIS — S72001A Fracture of unspecified part of neck of right femur, initial encounter for closed fracture: Secondary | ICD-10-CM | POA: Diagnosis not present

## 2020-07-18 LAB — CBC
HCT: 23.9 % — ABNORMAL LOW (ref 36.0–46.0)
Hemoglobin: 7.7 g/dL — ABNORMAL LOW (ref 12.0–15.0)
MCH: 30 pg (ref 26.0–34.0)
MCHC: 32.2 g/dL (ref 30.0–36.0)
MCV: 93 fL (ref 80.0–100.0)
Platelets: 259 10*3/uL (ref 150–400)
RBC: 2.57 MIL/uL — ABNORMAL LOW (ref 3.87–5.11)
RDW: 13.7 % (ref 11.5–15.5)
WBC: 9.9 10*3/uL (ref 4.0–10.5)
nRBC: 0 % (ref 0.0–0.2)

## 2020-07-18 LAB — BASIC METABOLIC PANEL
Anion gap: 8 (ref 5–15)
BUN: 25 mg/dL — ABNORMAL HIGH (ref 8–23)
CO2: 27 mmol/L (ref 22–32)
Calcium: 8.4 mg/dL — ABNORMAL LOW (ref 8.9–10.3)
Chloride: 105 mmol/L (ref 98–111)
Creatinine, Ser: 0.73 mg/dL (ref 0.44–1.00)
GFR, Estimated: >60 mL/min (ref >60)
Glucose, Bld: 113 mg/dL — ABNORMAL HIGH (ref 70–99)
Potassium: 3.5 mmol/L (ref 3.5–5.1)
Sodium: 140 mmol/L (ref 135–145)

## 2020-07-18 LAB — SARS CORONAVIRUS 2 (TAT 6-24 HRS): SARS Coronavirus 2: NEGATIVE

## 2020-07-18 MED ORDER — SODIUM CHLORIDE 0.9% IV SOLUTION: Freq: Once | INTRAVENOUS | Status: DC

## 2020-07-18 MED ORDER — SODIUM CHLORIDE 0.9 % IV SOLN
1.0000 g | INTRAVENOUS | Status: AC
  Administered 2020-07-18: 1 g via INTRAVENOUS
  Filled 2020-07-18: qty 10

## 2020-07-18 MED ORDER — IRBESARTAN 150 MG PO TABS
75.0000 mg | ORAL_TABLET | Freq: Every day | ORAL | Status: DC
  Administered 2020-07-18 – 2020-07-19 (×2): 75 mg via ORAL
  Filled 2020-07-18 (×2): qty 1

## 2020-07-18 NOTE — TOC Transition Note (Signed)
Transition of Care [facility]) - CM/SW Discharge Note   Patient Details  Name: [name] MRN: [id number] Date of Birth: August 03, 1926  [facility] CM/SW Contact:  Sharin Mons, RN Phone Number: 07/18/2020, 12:08 PM   Clinical Narrative:     Pt/daughterHassan Rowan) accepted SN[facility] bed [facility]. [facility] admission liaison snf bed will be available on tomorrow, 6/2, MD and nurse aware updated COVID needed.  TOC team will continur to monitor and assist with needs....    Barriers to Discharge: No SNF bed   Patient Goals and CMS Choice        Discharge Placement                       Discharge Plan and Services                                     Social Determinants of Health (SDOH) Interventions     Readmission Risk Interventions No flowsheet data found.

## 2020-07-18 NOTE — Progress Notes (Signed)
  Speech Language Pathology Treatment: Dysphagia  Patient Details Name: [name] MRN: [id number] DOB: January 16, 1927 Today's Date: 07/18/2020 Time: 9728-2060 SLP Time Calculation (min) (ACUTE ONLY): 11 min  Assessment / Plan / Recommendation Clinical Impression  She is awake today sustaining throughout dysphagia intervention. Therapist downgraded to nectar thick yesterday following lethargic and deconditioned/fatugued state and s/s aspiration. Her swallow and respirations were better coordinated today. There continues to be question of airway intrusion as she had delayed throat clears and mild vocal wetness x 1. Her risk would be increased with thin liquids. No family present to discuss goals for nutrition. With plan being SNF, would recommend she continue puree and nectar thick liquids. Further assessments with SLP at facility can be done and discuss with family desire to continue with po modifications or allow for quality with known risks.    HPI HPI: 87 yo female presenting from assisted living on 5/27 after unwitnessed fall. This is the patient's 4th fall this month. Imaging revealed: no acute intercranial abnormality, and R femoral neck fx. Pt is now s/p R THA on 5/27. PMH includes: dementia, HTN, HLD, afib, CAD, and intraventricular hemorrhage with residual L-sided deficits.      SLP Plan  Continue with current plan of care       Recommendations  Diet recommendations: Dysphagia 1 (puree);Nectar-thick liquid Liquids provided via: Cup;Straw Medication Administration: Crushed with puree Supervision: Staff to assist with self feeding;Full supervision/cueing for compensatory strategies Compensations: Slow rate;Small sips/bites;Lingual sweep for clearance of pocketing Postural Changes and/or Swallow Maneuvers: Seated upright 90 degrees                Oral Care Recommendations: Oral care BID Follow up Recommendations: Skilled Nursing facility SLP Visit Diagnosis: Dysphagia,  oropharyngeal phase (R13.12) Plan: Continue with current plan of care                       [name] 07/18/2020, 11:30 AM   Orbie Pyo Colvin Caroli.Ed Risk analyst [phone] Office [phone]

## 2020-07-18 NOTE — Progress Notes (Addendum)
[facility]                                                                              Patient Demographics  [name], is a 87 y.o. female, DOB - [birth year], LHT:[id number]  Admit date - 07/13/2020   Admitting Physician Norval Morton, MD  Outpatient Primary MD for the patient is Merrilee Seashore, MD  Outpatient specialists:   LOS - 5  days   Medical records reviewed and are as summarized below:    Chief Complaint  Patient presents with  . Fall  . Head Injury       Brief summary   Patient is a 87 year old female with history of hypertension, hyperlipidemia, chronic A. fib, not on AC, CAD, IVH with residual left-sided hemiparesis, dementia.  Patient was admitted with unwitnessed fall.  CT scan of the brain showed large right posterior scalp laceration with small hematoma, no intracranial abnormality. X-rays revealed acute right subcapital femoral neck fracture. Underwent right hip hemiarthroplasty on 07/13/2020 UA suggestive of likely UTI however urine culture grew multiple organisms Awaiting skilled nursing facility   Assessment & Plan    Principal Problem: Mechanical fall with closed right hip fracture ([place]) -Unwitnessed fall at ALF, x-rays revealed right subcapital femoral neck fracture.  Orthopedics was consulted. -Underwent right hip hemiarthroplasty on 5/27 (Dr Lyla Glassing) -PT recommended skilled nursing facility. -Hemoglobin 7.3, on 5/31, will repeat CBC again  - will transfuse 1 unit packed RBCs if hemoglobin lower.  Was 6.7 on 07/15/2020  Active Problems: Acute blood loss anemia superimposed on chronic anemia -On admission hemoglobin 10.4, had trended down to 6.7 on 5/29, received 1 unit packed RBC transfusion. -Hemoglobin 7.3 on 5/31, follow CBC, will transfuse 1 unit packed RBCs if hemoglobin lower.  Discussed with patient's daughter, Ms Annamaria Boots who agreed with the plan. Addendum 1:25 PM Repeat hemoglobin level 7.7, hold off on  transfusion  Leukocytosis, possible UTI - repeat urine culture negative  -Patient received Ancef on 5/28, 2 doses of IV Rocephin, DC IV Rocephin after today's dose. -Leukocytosis resolved  Transient hypotension - On admission, BP as low as 62/40, improved after IV fluids. -  BP is now stable, metoprolol and irbesartan has been on hold -Heart rate is 50s to 60s, will continue to hold metoprolol,  - resume home dose of irbesartan, 75 mg daily  Transient hypoxia, mild acute respiratory failure -Possibly due to atelectasis, pain. -Currently maintaining O2 sats 97% on room air  Paroxysmal atrial fibrillation -Not on anticoagulation due to prior falls, history of intraventricular hemorrhage -Heart rate in 50s to 60s, currently beta-blocker on hold.  GERD -Continue Pepcid  History of intraventricular hemorrhage, residual left-sided hemiparesis -Patient has a history of IVH while on Xarelto for A. fib in 2019.  Anticoagulation has been discontinued. -Continue Keppra for seizure prophylaxis.  History of dementia -Currently stable, continue delirium precaution  Code Status: DNR DVT Prophylaxis:  SCDs Start: 07/14/20 0023   Level of Care: Level of care: Telemetry Surgical Family Communication: Discussed all imaging results, lab results, explained to the patient's daughter on the phone    Disposition Plan:     Status  is: Inpatient  Remains inpatient appropriate because:Inpatient level of care appropriate due to severity of illness   Dispo: The patient is from: ALF              Anticipated d/c is to: SNF              Patient currently is not medically stable to d/c.  Awaiting CBC, if needs transfusion.  Awaiting skilled facility   Difficult to place patient No      Time Spent in minutes 25 minutes  Procedures:  Right hip hemiarthroplasty 5/27  Consultants:   Orthopedics  Antimicrobials:   Anti-infectives (From admission, onward)   Start     Dose/Rate Route  Frequency Ordered Stop   07/16/20 1600  cefTRIAXone (ROCEPHIN) 1 g in sodium chloride 0.9 % 100 mL IVPB        1 g 200 mL/hr over 30 Minutes Intravenous Every 24 hours 07/16/20 1512     07/14/20 0115  ceFAZolin (ANCEF) IVPB 2g/100 mL premix        2 g 200 mL/hr over 30 Minutes Intravenous Every 6 hours 07/14/20 0026 07/14/20 0850   07/13/20 1630  ceFAZolin (ANCEF) IVPB 2g/100 mL premix  Status:  Discontinued        2 g 200 mL/hr over 30 Minutes Intravenous On call to O.R. 07/13/20 1614 07/13/20 1952   07/13/20 1611  ceFAZolin (ANCEF) 2-4 GM/100ML-% IVPB       Note to Pharmacy: Ladoris Gene   : cabinet override      07/13/20 1611 07/14/20 0414          Medications  Scheduled Meds: . aspirin EC  325 mg Oral Q breakfast  . docusate  100 mg Oral BID  . levETIRAcetam  500 mg Oral Daily  . melatonin  10 mg Oral QHS  . pantoprazole  40 mg Oral Daily  . senna  1 tablet Oral BID  . sertraline  50 mg Oral Daily   Continuous Infusions: . cefTRIAXone (ROCEPHIN)  IV 1 g (07/17/20 1604)   PRN Meds:.HYDROcodone-acetaminophen, menthol-cetylpyridinium **OR** phenol, morphine injection, ondansetron **OR** ondansetron (ZOFRAN) IV      Subjective:   [name] was seen and examined today.  No acute complaints.  Patient denies dizziness, chest pain, shortness of breath, abdominal pain, nausea or vomiting. No acute events overnight.    Objective:   Vitals:   07/18/20 0346 07/18/20 0500 07/18/20 0502 07/18/20 0809  BP:   135/83 (!) 143/56  Pulse: (!) 59  (!) 57 84  Resp: 16  16 16   Temp: 98.3 F (36.8 C) 97.9 F (36.6 C)  97.6 F (36.4 C)  TempSrc: Oral     SpO2:   97% 98%  Weight:      Height:        Intake/Output Summary (Last 24 hours) at 07/18/2020 1121 Last data filed at 07/18/2020 0900 Gross per 24 hour  Intake 360 ml  Output --  Net 360 ml     Wt Readings from Last 3 Encounters:  07/13/20 61.2 kg  06/11/20 67.6 kg  07/06/19 62.1 kg     Exam  General:  Alert, NAD  Cardiovascular: S1 S2 auscultated, no murmurs, RRR  Respiratory: Diminished breath sound at the bases otherwise fairly clear  Gastrointestinal: Soft, nontender, nondistended, + bowel sounds  Ext: no pedal edema bilaterally  Neuro: left-sided hemiparesis, chronic  Musculoskeletal: No digital cyanosis, clubbing  Skin: No rashes  Psych: normal affect and demeanor  Data Reviewed:  I have personally reviewed following labs and imaging studies  Micro Results Recent Results (from the past 240 hour(s))  Resp Panel by RT-PCR (Flu A&B, Covid) Nasopharyngeal Swab     Status: None   Collection Time: 07/13/20  8:25 AM   Specimen: Nasopharyngeal Swab; Nasopharyngeal(NP) swabs in vial transport medium  Result Value Ref Range Status   SARS Coronavirus 2 by RT PCR NEGATIVE NEGATIVE Final    Comment: (NOTE) SARS-CoV-2 target nucleic acids are NOT DETECTED.  The SARS-CoV-2 RNA is generally detectable in upper respiratory specimens during the acute phase of infection. The lowest concentration of SARS-CoV-2 viral copies this assay can detect is 138 copies/mL. A negative result does not preclude SARS-Cov-2 infection and should not be used as the sole basis for treatment or other patient management decisions. A negative result may occur with  improper specimen collection/handling, submission of specimen other than nasopharyngeal swab, presence of viral mutation(s) within the areas targeted by this assay, and inadequate number of viral copies(<138 copies/mL). A negative result must be combined with clinical observations, patient history, and epidemiological information. The expected result is Negative.  Fact Sheet for Patients:  EntrepreneurPulse.com.au  Fact Sheet for Healthcare Providers:  IncredibleEmployment.be  This test is no t yet approved or cleared by the [place] FDA and  has been authorized for detection and/or diagnosis of  SARS-CoV-2 by FDA under an Emergency Use Authorization (EUA). This EUA will remain  in effect (meaning this test can be used) for the duration of the COVID-19 declaration under Section 564(b)(1) of the Act, 21 U.S.C.section 360bbb-3(b)(1), unless the authorization is terminated  or revoked sooner.       Influenza A by PCR NEGATIVE NEGATIVE Final   Influenza B by PCR NEGATIVE NEGATIVE Final    Comment: (NOTE) The Xpert Xpress SARS-CoV-2/FLU/RSV plus assay is intended as an aid in the diagnosis of influenza from Nasopharyngeal swab specimens and should not be used as a sole basis for treatment. Nasal washings and aspirates are unacceptable for Xpert Xpress SARS-CoV-2/FLU/RSV testing.  Fact Sheet for Patients: EntrepreneurPulse.com.au  Fact Sheet for Healthcare Providers: IncredibleEmployment.be  This test is not yet approved or cleared by the [place] FDA and has been authorized for detection and/or diagnosis of SARS-CoV-2 by FDA under an Emergency Use Authorization (EUA). This EUA will remain in effect (meaning this test can be used) for the duration of the COVID-19 declaration under Section 564(b)(1) of the Act, 21 U.S.C. section 360bbb-3(b)(1), unless the authorization is terminated or revoked.  Performed at [facility], [place] [address] [place], [place] [address]   Culture, Urine     Status: Abnormal   Collection Time: 07/14/20  4:59 PM   Specimen: Urine, Random  Result Value Ref Range Status   Specimen Description URINE, RANDOM  Final   Special Requests   Final    NONE Performed at [facility], [place] [address] [place], [place] [address]    Culture MULTIPLE SPECIES PRESENT, SUGGEST RECOLLECTION (A)  Final   Report Status 07/17/2020 FINAL  Final    Radiology Reports DG Chest 1 View  Result Date: 07/13/2020 CLINICAL DATA:  Unwitnessed fall, dementia. EXAM: CHEST  1 VIEW COMPARISON:  Chest radiograph 07/01/2019 and  chest CT from 06/30/2019 FINDINGS: Stable calcified granuloma in the left lung. Old left posterior rib fractures. Lungs are clear without airspace disease or pulmonary edema. Heart size is stable. No large pneumothorax. Atherosclerotic calcifications at the aortic arch. IMPRESSION: No  active disease. Electronically Signed   By: Markus Daft M.D.   On: 07/13/2020 08:22   CT HEAD WO CONTRAST  Result Date: 07/14/2020 CLINICAL DATA:  Follow-up EXAM: CT HEAD WITHOUT CONTRAST TECHNIQUE: Contiguous axial images were obtained from the base of the skull through the vertex without intravenous contrast. COMPARISON:  Jul 13, 2020, March 12, 2018 FINDINGS: Brain: No evidence of acute infarction, hemorrhage, hydrocephalus, extra-axial collection or mass lesion/mass effect. Periventricular white matter hypodensities consistent with sequela of chronic microvascular ischemic disease. Global parenchymal volume loss. Remote lacunar infarctions the RIGHT basal ganglia. Vascular: Vascular calcifications. Skull: No acute fracture. Sinuses/Orbits: No acute finding. Other: Staples overlie the RIGHT parietal scalp. Decreased conspicuity of previously described scalp hematoma. IMPRESSION: No acute intracranial abnormality. Electronically Signed   By: Valentino Saxon MD   On: 07/14/2020 18:22   CT Head Wo Contrast  Result Date: 07/13/2020 CLINICAL DATA:  Un witnessed fall.  Head trauma.  Neck trauma. EXAM: CT HEAD WITHOUT CONTRAST CT CERVICAL SPINE WITHOUT CONTRAST TECHNIQUE: Multidetector CT imaging of the head and cervical spine was performed following the standard protocol without intravenous contrast. Multiplanar CT image reconstructions of the cervical spine were also generated. COMPARISON:  03/12/2018 FINDINGS: CT HEAD FINDINGS Brain: No evidence of acute infarction, hemorrhage, hydrocephalus, extra-axial collection or mass lesion/mass effect. Marked prominence of sulci and ventricles compatible with brain atrophy. There is  moderate to marked diffuse low-attenuation within the subcortical and periventricular white matter compatible with chronic microvascular disease. Vascular: No hyperdense vessel or unexpected calcification. Skull: Normal. Negative for fracture or focal lesion. Sinuses/Orbits: No acute finding. Other: Large right posterior scalp laceration and small subgaleal hematoma identified. CT CERVICAL SPINE FINDINGS Alignment: Normal alignment of the cervical spine. Skull base and vertebrae: The vertebral body heights are well preserved. The facet joints all appear well aligned. There is no fracture or dislocation. Soft tissues and spinal canal: No prevertebral fluid or swelling. No visible canal hematoma. Disc levels: There is multilevel disc space narrowing and endplate spurring noted. This is most notable at C2-3 C4-5 and C5-6. Upper chest: Negative. Other: None IMPRESSION: 1. No acute intracranial abnormalities. 2. Chronic small vessel ischemic change and brain atrophy. 3. Large right posterior scalp laceration and small subgaleal hematoma. 4. No evidence for cervical spine fracture. Electronically Signed   By: Kerby Moors M.D.   On: 07/13/2020 07:49   CT Cervical Spine Wo Contrast  Result Date: 07/13/2020 CLINICAL DATA:  Un witnessed fall.  Head trauma.  Neck trauma. EXAM: CT HEAD WITHOUT CONTRAST CT CERVICAL SPINE WITHOUT CONTRAST TECHNIQUE: Multidetector CT imaging of the head and cervical spine was performed following the standard protocol without intravenous contrast. Multiplanar CT image reconstructions of the cervical spine were also generated. COMPARISON:  03/12/2018 FINDINGS: CT HEAD FINDINGS Brain: No evidence of acute infarction, hemorrhage, hydrocephalus, extra-axial collection or mass lesion/mass effect. Marked prominence of sulci and ventricles compatible with brain atrophy. There is moderate to marked diffuse low-attenuation within the subcortical and periventricular white matter compatible with  chronic microvascular disease. Vascular: No hyperdense vessel or unexpected calcification. Skull: Normal. Negative for fracture or focal lesion. Sinuses/Orbits: No acute finding. Other: Large right posterior scalp laceration and small subgaleal hematoma identified. CT CERVICAL SPINE FINDINGS Alignment: Normal alignment of the cervical spine. Skull base and vertebrae: The vertebral body heights are well preserved. The facet joints all appear well aligned. There is no fracture or dislocation. Soft tissues and spinal canal: No prevertebral fluid or swelling. No visible canal hematoma.  Disc levels: There is multilevel disc space narrowing and endplate spurring noted. This is most notable at C2-3 C4-5 and C5-6. Upper chest: Negative. Other: None IMPRESSION: 1. No acute intracranial abnormalities. 2. Chronic small vessel ischemic change and brain atrophy. 3. Large right posterior scalp laceration and small subgaleal hematoma. 4. No evidence for cervical spine fracture. Electronically Signed   By: Kerby Moors M.D.   On: 07/13/2020 07:49   DG C-Arm 1-60 Min  Result Date: 07/13/2020 CLINICAL DATA:  Right hip arthroplasty. EXAM: DG C-ARM 1-60 MIN; OPERATIVE RIGHT HIP WITH PELVIS FLUOROSCOPY TIME:  Fluoroscopy Time:  15 seconds Radiation Exposure Index (if provided by the fluoroscopic device): 1.71 mGy Number of Acquired Spot Images: 4 COMPARISON:  Preoperative radiograph 07/13/2020 FINDINGS: Four fluoroscopic spot views obtained in the operating room of the right hip and pelvis during right hip arthroplasty. IMPRESSION: Fluoroscopic spot views during right hip arthroplasty. Electronically Signed   By: Keith Rake M.D.   On: 07/13/2020 18:48   DG HIP OPERATIVE UNILAT WITH PELVIS RIGHT  Result Date: 07/13/2020 CLINICAL DATA:  Right hip arthroplasty. EXAM: DG C-ARM 1-60 MIN; OPERATIVE RIGHT HIP WITH PELVIS FLUOROSCOPY TIME:  Fluoroscopy Time:  15 seconds Radiation Exposure Index (if provided by the fluoroscopic  device): 1.71 mGy Number of Acquired Spot Images: 4 COMPARISON:  Preoperative radiograph 07/13/2020 FINDINGS: Four fluoroscopic spot views obtained in the operating room of the right hip and pelvis during right hip arthroplasty. IMPRESSION: Fluoroscopic spot views during right hip arthroplasty. Electronically Signed   By: Keith Rake M.D.   On: 07/13/2020 18:48   DG HIP UNILAT WITH PELVIS 2-3 VIEWS RIGHT  Result Date: 07/13/2020 CLINICAL DATA:  Fall, hip pain, dementia EXAM: DG HIP (WITH OR WITHOUT PELVIS) 2-3V RIGHT COMPARISON:  None. FINDINGS: There is an acute displaced and angulated right hip subcapital femoral neck fracture. Femoral head remains located. Bones are osteopenic. Degenerative changes and scoliosis of lower lumbar spine. Nonobstructive bowel gas pattern. IMPRESSION: Acute right hip subcapital femoral neck fracture. Osteopenia Electronically Signed   By: Jerilynn Mages.  Shick M.D.   On: 07/13/2020 08:23    Lab Data:  CBC: Recent Labs  Lab 07/13/20 0616 07/13/20 1305 07/14/20 0226 07/15/20 0507 07/15/20 1450 07/16/20 0044 07/17/20 0123  WBC 17.2*  --  17.2* 10.6*  --  9.8 9.9  NEUTROABS 12.8*  --   --  6.7  --   --  5.9  HGB 10.4*   < > 8.0* 6.7* 8.2* 7.6* 7.3*  HCT 32.5*   < > 25.5* 20.6* 25.2* 23.5* 22.6*  MCV 95.6  --  99.2 95.4  --  92.5 93.4  PLT 255  --  182 144*  --  144* 189   < > = values in this interval not displayed.   Basic Metabolic Panel: Recent Labs  Lab 07/13/20 0915 07/14/20 0226 07/15/20 0507 07/17/20 0123  NA 138 139 135 139  K 4.7 6.1* 4.2 4.3  CL 109 107 107 109  CO2 24 17* 23 23  GLUCOSE 160* 134* 127* 92  BUN 17 23 31* 32*  CREATININE 0.96 1.32* 1.27* 0.85  CALCIUM 8.2* 8.1* 7.8* 8.2*  MG  --   --  1.6* 2.0  PHOS  --   --  2.9 2.4*   GFR: Estimated Creatinine Clearance: 39.5 mL/min (by C-G formula based on SCr of 0.85 mg/dL). Liver Function Tests: Recent Labs  Lab 07/15/20 0507 07/17/20 0123  ALBUMIN 2.4* 2.1*   No results for  input(s): LIPASE, AMYLASE in the last 168 hours. No results for input(s): AMMONIA in the last 168 hours. Coagulation Profile: Recent Labs  Lab 07/13/20 0820  INR 1.1   Cardiac Enzymes: No results for input(s): CKTOTAL, CKMB, CKMBINDEX, TROPONINI in the last 168 hours. BNP (last 3 results) No results for input(s): PROBNP in the last 8760 hours. HbA1C: No results for input(s): HGBA1C in the last 72 hours. CBG: Recent Labs  Lab 07/15/20 2256  GLUCAP 109*   Lipid Profile: No results for input(s): CHOL, HDL, LDLCALC, TRIG, CHOLHDL, LDLDIRECT in the last 72 hours. Thyroid Function Tests: No results for input(s): TSH, T4TOTAL, FREET4, T3FREE, THYROIDAB in the last 72 hours. Anemia Panel: No results for input(s): VITAMINB12, FOLATE, FERRITIN, TIBC, IRON, RETICCTPCT in the last 72 hours. Urine analysis:    Component Value Date/Time   COLORURINE AMBER (A) 07/16/2020 1323   APPEARANCEUR CLOUDY (A) 07/16/2020 1323   LABSPEC 1.020 07/16/2020 1323   PHURINE 5.0 07/16/2020 1323   GLUCOSEU NEGATIVE 07/16/2020 1323   HGBUR SMALL (A) 07/16/2020 1323   BILIRUBINUR NEGATIVE 07/16/2020 [place] 07/16/2020 1323   PROTEINUR 30 (A) 07/16/2020 1323   UROBILINOGEN 0.2 08/17/2012 1829   NITRITE NEGATIVE 07/16/2020 1323   LEUKOCYTESUR LARGE (A) 07/16/2020 1323     Malic Rosten M.D. [facility] 07/18/2020, 11:21 AM  Available via Epic secure chat 7am-7pm After 7 pm, please refer to night coverage provider listed on amion.

## 2020-07-18 NOTE — Plan of Care (Signed)
  Problem: Clinical Measurements: Goal: Ability to maintain clinical measurements within normal limits will improve Outcome: Progressing   Problem: Pain Managment: Goal: General experience of comfort will improve Outcome: Progressing   Problem: Health Behavior/Discharge Planning: Goal: Ability to manage health-related needs will improve Outcome: Not Progressing   Problem: Activity: Goal: Risk for activity intolerance will decrease Outcome: Not Progressing   Problem: Nutrition: Goal: Adequate nutrition will be maintained Outcome: Not Progressing

## 2020-07-19 DIAGNOSIS — Y92128 Other place in nursing home as the place of occurrence of the external cause: Secondary | ICD-10-CM | POA: Diagnosis not present

## 2020-07-19 DIAGNOSIS — F0280 Dementia in other diseases classified elsewhere without behavioral disturbance: Secondary | ICD-10-CM | POA: Diagnosis not present

## 2020-07-19 DIAGNOSIS — I69354 Hemiplegia and hemiparesis following cerebral infarction affecting left non-dominant side: Secondary | ICD-10-CM | POA: Diagnosis not present

## 2020-07-19 DIAGNOSIS — Z9189 Other specified personal risk factors, not elsewhere classified: Secondary | ICD-10-CM | POA: Diagnosis not present

## 2020-07-19 DIAGNOSIS — F0390 Unspecified dementia without behavioral disturbance: Secondary | ICD-10-CM | POA: Diagnosis not present

## 2020-07-19 DIAGNOSIS — R531 Weakness: Secondary | ICD-10-CM | POA: Diagnosis not present

## 2020-07-19 DIAGNOSIS — N390 Urinary tract infection, site not specified: Secondary | ICD-10-CM | POA: Diagnosis not present

## 2020-07-19 DIAGNOSIS — R55 Syncope and collapse: Secondary | ICD-10-CM | POA: Diagnosis not present

## 2020-07-19 DIAGNOSIS — M6281 Muscle weakness (generalized): Secondary | ICD-10-CM | POA: Diagnosis not present

## 2020-07-19 DIAGNOSIS — S72001S Fracture of unspecified part of neck of right femur, sequela: Secondary | ICD-10-CM | POA: Diagnosis not present

## 2020-07-19 DIAGNOSIS — I2510 Atherosclerotic heart disease of native coronary artery without angina pectoris: Secondary | ICD-10-CM | POA: Diagnosis not present

## 2020-07-19 DIAGNOSIS — G8190 Hemiplegia, unspecified affecting unspecified side: Secondary | ICD-10-CM | POA: Diagnosis not present

## 2020-07-19 DIAGNOSIS — M24549 Contracture, unspecified hand: Secondary | ICD-10-CM | POA: Diagnosis not present

## 2020-07-19 DIAGNOSIS — R1312 Dysphagia, oropharyngeal phase: Secondary | ICD-10-CM | POA: Diagnosis not present

## 2020-07-19 DIAGNOSIS — E860 Dehydration: Secondary | ICD-10-CM | POA: Diagnosis not present

## 2020-07-19 DIAGNOSIS — Z889 Allergy status to unspecified drugs, medicaments and biological substances status: Secondary | ICD-10-CM | POA: Diagnosis not present

## 2020-07-19 DIAGNOSIS — I69391 Dysphagia following cerebral infarction: Secondary | ICD-10-CM | POA: Diagnosis not present

## 2020-07-19 DIAGNOSIS — S51811D Laceration without foreign body of right forearm, subsequent encounter: Secondary | ICD-10-CM | POA: Diagnosis not present

## 2020-07-19 DIAGNOSIS — W1830XA Fall on same level, unspecified, initial encounter: Secondary | ICD-10-CM | POA: Diagnosis not present

## 2020-07-19 DIAGNOSIS — R41841 Cognitive communication deficit: Secondary | ICD-10-CM | POA: Diagnosis not present

## 2020-07-19 DIAGNOSIS — R262 Difficulty in walking, not elsewhere classified: Secondary | ICD-10-CM | POA: Diagnosis not present

## 2020-07-19 DIAGNOSIS — F419 Anxiety disorder, unspecified: Secondary | ICD-10-CM | POA: Diagnosis not present

## 2020-07-19 DIAGNOSIS — S51011D Laceration without foreign body of right elbow, subsequent encounter: Secondary | ICD-10-CM | POA: Diagnosis not present

## 2020-07-19 DIAGNOSIS — I959 Hypotension, unspecified: Secondary | ICD-10-CM | POA: Diagnosis not present

## 2020-07-19 DIAGNOSIS — E782 Mixed hyperlipidemia: Secondary | ICD-10-CM | POA: Diagnosis not present

## 2020-07-19 DIAGNOSIS — R2681 Unsteadiness on feet: Secondary | ICD-10-CM | POA: Diagnosis not present

## 2020-07-19 DIAGNOSIS — I4891 Unspecified atrial fibrillation: Secondary | ICD-10-CM | POA: Diagnosis not present

## 2020-07-19 DIAGNOSIS — D62 Acute posthemorrhagic anemia: Secondary | ICD-10-CM | POA: Diagnosis not present

## 2020-07-19 DIAGNOSIS — K219 Gastro-esophageal reflux disease without esophagitis: Secondary | ICD-10-CM | POA: Diagnosis not present

## 2020-07-19 DIAGNOSIS — S7291XA Unspecified fracture of right femur, initial encounter for closed fracture: Secondary | ICD-10-CM | POA: Diagnosis not present

## 2020-07-19 DIAGNOSIS — Z23 Encounter for immunization: Secondary | ICD-10-CM | POA: Diagnosis not present

## 2020-07-19 DIAGNOSIS — Z515 Encounter for palliative care: Secondary | ICD-10-CM | POA: Diagnosis not present

## 2020-07-19 DIAGNOSIS — S72031D Displaced midcervical fracture of right femur, subsequent encounter for closed fracture with routine healing: Secondary | ICD-10-CM | POA: Diagnosis not present

## 2020-07-19 DIAGNOSIS — D72828 Other elevated white blood cell count: Secondary | ICD-10-CM | POA: Diagnosis not present

## 2020-07-19 DIAGNOSIS — R6889 Other general symptoms and signs: Secondary | ICD-10-CM | POA: Diagnosis not present

## 2020-07-19 DIAGNOSIS — R0989 Other specified symptoms and signs involving the circulatory and respiratory systems: Secondary | ICD-10-CM | POA: Diagnosis not present

## 2020-07-19 DIAGNOSIS — D649 Anemia, unspecified: Secondary | ICD-10-CM | POA: Diagnosis not present

## 2020-07-19 DIAGNOSIS — S72001A Fracture of unspecified part of neck of right femur, initial encounter for closed fracture: Secondary | ICD-10-CM | POA: Diagnosis not present

## 2020-07-19 DIAGNOSIS — D696 Thrombocytopenia, unspecified: Secondary | ICD-10-CM | POA: Diagnosis not present

## 2020-07-19 DIAGNOSIS — I10 Essential (primary) hypertension: Secondary | ICD-10-CM | POA: Diagnosis not present

## 2020-07-19 DIAGNOSIS — S72011A Unspecified intracapsular fracture of right femur, initial encounter for closed fracture: Secondary | ICD-10-CM | POA: Diagnosis not present

## 2020-07-19 DIAGNOSIS — K5901 Slow transit constipation: Secondary | ICD-10-CM | POA: Diagnosis not present

## 2020-07-19 DIAGNOSIS — R2689 Other abnormalities of gait and mobility: Secondary | ICD-10-CM | POA: Diagnosis not present

## 2020-07-19 DIAGNOSIS — Z96641 Presence of right artificial hip joint: Secondary | ICD-10-CM | POA: Diagnosis not present

## 2020-07-19 DIAGNOSIS — S81811A Laceration without foreign body, right lower leg, initial encounter: Secondary | ICD-10-CM | POA: Diagnosis not present

## 2020-07-19 DIAGNOSIS — I480 Paroxysmal atrial fibrillation: Secondary | ICD-10-CM | POA: Diagnosis not present

## 2020-07-19 DIAGNOSIS — R630 Anorexia: Secondary | ICD-10-CM | POA: Diagnosis not present

## 2020-07-19 DIAGNOSIS — R278 Other lack of coordination: Secondary | ICD-10-CM | POA: Diagnosis not present

## 2020-07-19 LAB — URINE CULTURE: Culture: 30000 — AB

## 2020-07-19 MED ORDER — ACETAMINOPHEN 500 MG PO TABS: 500.0000 mg | ORAL_TABLET | Freq: Four times a day (QID) | ORAL | 0 refills | Status: DC | PRN

## 2020-07-19 MED ORDER — ASPIRIN 325 MG PO TBEC: 325.0000 mg | DELAYED_RELEASE_TABLET | Freq: Every day | ORAL | 0 refills | Status: DC

## 2020-07-19 MED ORDER — HYDROCODONE-ACETAMINOPHEN 5-325 MG PO TABS: 1.0000 | ORAL_TABLET | Freq: Four times a day (QID) | ORAL | 0 refills | Status: DC | PRN

## 2020-07-19 MED ORDER — PANTOPRAZOLE SODIUM 40 MG PO TBEC: 40.0000 mg | DELAYED_RELEASE_TABLET | Freq: Every day | ORAL | Status: DC

## 2020-07-19 NOTE — TOC Transition Note (Signed)
Transition of Care [facility]) - CM/SW Discharge Note   Patient Details  Name: BRYLEI PEDLEY MRN: [id number] Date of Birth: December 12, 1926  [facility]) CM/SW Contact:  Sharin Mons, RN Phone Number: 07/19/2020, 12:29 PM   Clinical Narrative:    Patient will DC to: [place] Anticipated DC date: 07/19/2020 Family notified: yes, [name] ( daughter) Transport by:   Per MD patient ready for DC today . RN, patient, patient's family, and facility notified of DC. Discharge Summary and FL2 sent to facility. RN to call report prior to discharge [phone]). DC packet on chart. Ambulance transport requested for patient.   RNCM will sign off for now as intervention is no longer needed. Please consult [place] again if new needs arise.   Final next level of care: [place] ([place]) Barriers to Discharge: No Barriers Identified   Patient Goals and CMS Choice        Discharge Placement                       Discharge Plan and Services                                     Social Determinants of Health (SDOH) Interventions     Readmission Risk Interventions No flowsheet data found.

## 2020-07-19 NOTE — Discharge Summary (Signed)
Physician Discharge Summary   Patient ID: [name] MRN: [id number] DOB/AGE: 85/15/1928 85 y.o.  Admit date: 07/13/2020 Discharge date: 07/19/2020  Primary Care Physician:  Merrilee Seashore, MD   Recommendations for Outpatient Follow-up:  1. Follow up with PCP in 1-2 weeks 2. Continue aspirin for DVT prophylaxis, outpatient follow-up with Dr. Lyla Glassing in 2 weeks  Home Health: Patient going to skilled nursing facility for rehab Equipment/Devices:   Discharge Condition: stable  CODE STATUS:  DNR   Diet recommendation: Dysphagia 1 diet, nectar thick   Discharge Diagnoses:    Marland Kitchen Mechanical fall with closed right hip fracture (HCC) Transient hypoxia, mild acute respiratory failure, resolved Transient hypotension . 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 GERD . ABLA (acute blood loss anemia) superimposed on chronic anemia . Paroxysmal atrial fibrillation (HCC) . Dementia . Leukocytosis . Generalized debility  Consults: Orthopedics    Allergies:   Allergies  Allergen Reactions  . Amlodipine Shortness Of Breath, Swelling and Other (See Comments)    Edema in legs  . Methyldopa Shortness Of Breath and Swelling  . Shellfish-Derived Products Other (See Comments)    Syncopal episode  . Tape Itching and Other (See Comments)    Also TEARS THE SKIN, SO PLEASE USE AN ALTERNATIVE!!  Marland Kitchen Actonel [Risedronate Sodium] Other (See Comments)    Caused a lump to come up in throat and caused heartburn  . Bactrim [Sulfamethoxazole-Trimethoprim] Itching  . Ciprofloxacin Hcl Other (See Comments)    Confusion   . Gas-X [Simethicone] Itching  . Hydralazine Hcl Other (See Comments)    Unknown reaction, per patient   . Lactose Intolerance (Gi) Diarrhea  . Lisinopril Cough  . Milk-Related Compounds Diarrhea  . Other Other (See Comments)    Orange juice : itching  . Oxycodone Hcl Other (See Comments)    Hallucinations   . Pneumococcal  Vaccines Swelling and Other (See Comments)    Arm swells  . Valsartan Other (See Comments)    Unknown reaction, per pt  . Cozaar [Losartan] Rash  . Digitek [Digoxin] Rash  . Felodipine Rash  . Hctz [Hydrochlorothiazide] Itching and Rash    Pt reports causing "itching under the skin"  . Simvastatin Rash  . Spironolactone Itching and Rash     DISCHARGE MEDICATIONS: Allergies as of 07/19/2020      Reactions   Amlodipine Shortness Of Breath, Swelling, Other (See Comments)   Edema in legs   Methyldopa Shortness Of Breath, Swelling   Shellfish-derived Products Other (See Comments)   Syncopal episode   Tape Itching, Other (See Comments)   Also TEARS THE SKIN, SO PLEASE USE AN ALTERNATIVE!!   Actonel [risedronate Sodium] Other (See Comments)   Caused a lump to come up in throat and caused heartburn   Bactrim [sulfamethoxazole-trimethoprim] Itching   Ciprofloxacin Hcl Other (See Comments)   Confusion   Gas-x [simethicone] Itching   Hydralazine Hcl Other (See Comments)   Unknown reaction, per patient   Lactose Intolerance (gi) Diarrhea   Lisinopril Cough   Milk-related Compounds Diarrhea   Other Other (See Comments)   Orange juice : itching   Oxycodone Hcl Other (See Comments)   Hallucinations   Pneumococcal Vaccines Swelling, Other (See Comments)   Arm swells   Valsartan Other (See Comments)   Unknown reaction, per pt   Cozaar [losartan] Rash   Digitek [digoxin] Rash   Felodipine Rash   Hctz [hydrochlorothiazide] Itching, Rash   Pt reports causing "itching under the skin"  Simvastatin Rash   Spironolactone Itching, Rash      Medication List    STOP taking these medications   lidocaine 5 % Commonly known as: LIDODERM   magnesium oxide 400 (241.3 Mg) MG tablet Commonly known as: MAG-OX   mouth rinse Liqd solution   Muscle Rub 10-15 % Crea   potassium chloride SA 20 MEQ tablet Commonly known as: KLOR-CON   ranitidine 150 MG/10ML syrup Commonly known as:  ZANTAC   tamsulosin 0.4 MG Caps capsule Commonly known as: FLOMAX     TAKE these medications   acetaminophen 500 MG tablet Commonly known as: TYLENOL Take 1 tablet (500 mg total) by mouth every 6 (six) hours as needed for mild pain or moderate pain. What changed:   how much to take  when to take this  reasons to take this   aspirin 325 MG EC tablet Take 1 tablet (325 mg total) by mouth daily with breakfast. Start taking on: July 20, 2020   docusate sodium 100 MG capsule Commonly known as: COLACE Take 1 capsule (100 mg total) by mouth 2 (two) times daily.   HYDROcodone-acetaminophen 5-325 MG tablet Commonly known as: NORCO/VICODIN Take 1 tablet by mouth every 6 (six) hours as needed for moderate pain or severe pain.   irbesartan 75 MG tablet Commonly known as: AVAPRO Take 75 mg by mouth daily.   levETIRAcetam 500 MG tablet Commonly known as: Keppra Take 1 tablet (500 mg total) by mouth 2 (two) times daily. What changed: when to take this   MAGNESIUM CHLORIDE-CALCIUM PO Take 1 tablet by mouth daily.   Melatonin 10 MG Tabs Take 10 mg by mouth at bedtime.   metoprolol tartrate 25 MG tablet Commonly known as: LOPRESSOR Take 25 mg by mouth daily. What changed: Another medication with the same name was removed. Continue taking this medication, and follow the directions you see here.   multivitamin with minerals Tabs tablet Take 1 tablet by mouth daily.   nitroGLYCERIN 0.4 MG SL tablet Commonly known as: NITROSTAT Place 1 tablet (0.4 mg total) under the tongue every 5 (five) minutes as needed for chest pain (3 doses only).   omeprazole 20 MG tablet Commonly known as: PRILOSEC OTC Take 20 mg by mouth daily.   pantoprazole 40 MG tablet Commonly known as: PROTONIX Take 1 tablet (40 mg total) by mouth daily. Start taking on: July 20, 2020   polyethylene glycol 17 g packet Commonly known as: MIRALAX / GLYCOLAX Take 17 g by mouth daily.   sertraline 50 MG  tablet Commonly known as: ZOLOFT Take 50 mg by mouth daily.   simethicone 80 MG chewable tablet Commonly known as: MYLICON Chew 1 tablet (80 mg total) by mouth 4 (four) times daily as needed for flatulence.            Discharge Care Instructions  (From admission, onward)         Start     Ordered   07/19/20 0000  If the dressing is still on your incision site when you go home, remove it on the third day after your surgery date. Remove dressing if it begins to fall off, or if it is dirty or damaged before the third day.        07/19/20 1005           Brief H and P: For complete details please refer to admission H and P, but in brief Patient is a 87 year old female with history of hypertension, hyperlipidemia,  chronic A. fib, not on AC, CAD, IVH with residual left-sided hemiparesis, dementia.  Patient was admitted with unwitnessed fall.  CT scan of the brain showed large right posterior scalp laceration with small hematoma, no intracranial abnormality. X-rays revealed acute right subcapital femoral neck fracture. Underwent right hip hemiarthroplasty on 07/13/2020 UA suggestive of likely UTI however urine culture grew multiple organisms   Hospital Course:   Mechanical fall with closed right hip fracture ([place]) -Unwitnessed fall at ALF, x-rays revealed right subcapital femoral neck fracture.  Orthopedics was consulted. -Underwent right hip hemiarthroplasty on 5/27 (Dr Lyla Glassing), continue PT OT -PT recommended skilled nursing facility.    Acute blood loss anemia superimposed on chronic anemia -On admission hemoglobin 10.4, had trended down to 6.7 on 5/29, received 1 unit packed RBC transfusion. - hemoglobin level 7.7 at discharge  Leukocytosis, possible UTI - repeat urine culture negative  -Patient received Ancef on 5/28, 2 doses of IV Rocephin, DC IV Rocephin after today's dose. -Leukocytosis resolved  Transient hypotension - On admission, BP as low as 62/40,  improved after IV fluids.  BP now stable - Continue metoprolol, irbesartan  Transient hypoxia, mild acute respiratory failure -Possibly due to atelectasis, pain. -Currently maintaining O2 sats 97% on room air  Paroxysmal atrial fibrillation -Not on anticoagulation due to prior falls, history of intraventricular hemorrhage -Heart rate controlled, continue beta-blocker  GERD -Continue Pepcid  History of intraventricular hemorrhage, residual left-sided hemiparesis -Patient has a history of IVH while on Xarelto for A. fib in 2019.  Anticoagulation has been discontinued. -Continue Keppra for seizure prophylaxis.  History of dementia - continue delirium precaution    Day of Discharge S: No acute issues overnight, stable for discharge to skilled nursing facility.  No fevers.  BP (!) 142/55 (BP Location: Left Arm)   Pulse 65   Temp 98.5 F (36.9 C) (Axillary)   Resp 16   Ht 5\' 6"  (1.676 m)   Wt 61.2 kg   SpO2 93%   BMI 21.79 kg/m   Physical Exam: General: Alert and awake, appears comfortable.  Dementia CVS: S1-S2 clear no murmur rubs or gallops Chest: clear to auscultation bilaterally, no wheezing rales or rhonchi Abdomen: soft nontender, nondistended, normal bowel sounds Extremities: no cyanosis, clubbing or edema noted bilaterally     Get Medicines reviewed and adjusted: Please take all your medications with you for your next visit with your Primary MD  Please request your Primary MD to go over all hospital tests and procedure/radiological results at the follow up. Please ask your Primary MD to get all Hospital records sent to his/her office.  If you experience worsening of your admission symptoms, develop shortness of breath, life threatening emergency, suicidal or homicidal thoughts you must seek medical attention immediately by calling 911 or calling your MD immediately  if symptoms less severe.  You must read complete instructions/literature along with all  the possible adverse reactions/side effects for all the Medicines you take and that have been prescribed to you. Take any new Medicines after you have completely understood and accept all the possible adverse reactions/side effects.   Do not drive when taking pain medications.   Do not take more than prescribed Pain, Sleep and Anxiety Medications  Special Instructions: If you have smoked or chewed Tobacco  in the last 2 yrs please stop smoking, stop any regular Alcohol  and or any Recreational drug use.  Wear Seat belts while driving.  Please note  You were cared for by a hospitalist during  your hospital stay. Once you are discharged, your primary care physician will handle any further medical issues. Please note that NO REFILLS for any discharge medications will be authorized once you are discharged, as it is imperative that you return to your primary care physician (or establish a relationship with a primary care physician if you do not have one) for your aftercare needs so that they can reassess your need for medications and monitor your lab values.   The results of significant diagnostics from this hospitalization (including imaging, microbiology, ancillary and laboratory) are listed below for reference.      Procedures/Studies:  DG Chest 1 View  Result Date: 07/13/2020 CLINICAL DATA:  Unwitnessed fall, dementia. EXAM: CHEST  1 VIEW COMPARISON:  Chest radiograph 07/01/2019 and chest CT from 06/30/2019 FINDINGS: Stable calcified granuloma in the left lung. Old left posterior rib fractures. Lungs are clear without airspace disease or pulmonary edema. Heart size is stable. No large pneumothorax. Atherosclerotic calcifications at the aortic arch. IMPRESSION: No active disease. Electronically Signed   By: Markus Daft M.D.   On: 07/13/2020 08:22   CT HEAD WO CONTRAST  Result Date: 07/14/2020 CLINICAL DATA:  Follow-up EXAM: CT HEAD WITHOUT CONTRAST TECHNIQUE: Contiguous axial images were  obtained from the base of the skull through the vertex without intravenous contrast. COMPARISON:  Jul 13, 2020, March 12, 2018 FINDINGS: Brain: No evidence of acute infarction, hemorrhage, hydrocephalus, extra-axial collection or mass lesion/mass effect. Periventricular white matter hypodensities consistent with sequela of chronic microvascular ischemic disease. Global parenchymal volume loss. Remote lacunar infarctions the RIGHT basal ganglia. Vascular: Vascular calcifications. Skull: No acute fracture. Sinuses/Orbits: No acute finding. Other: Staples overlie the RIGHT parietal scalp. Decreased conspicuity of previously described scalp hematoma. IMPRESSION: No acute intracranial abnormality. Electronically Signed   By: Valentino Saxon MD   On: 07/14/2020 18:22   CT Head Wo Contrast  Result Date: 07/13/2020 CLINICAL DATA:  Un witnessed fall.  Head trauma.  Neck trauma. EXAM: CT HEAD WITHOUT CONTRAST CT CERVICAL SPINE WITHOUT CONTRAST TECHNIQUE: Multidetector CT imaging of the head and cervical spine was performed following the standard protocol without intravenous contrast. Multiplanar CT image reconstructions of the cervical spine were also generated. COMPARISON:  03/12/2018 FINDINGS: CT HEAD FINDINGS Brain: No evidence of acute infarction, hemorrhage, hydrocephalus, extra-axial collection or mass lesion/mass effect. Marked prominence of sulci and ventricles compatible with brain atrophy. There is moderate to marked diffuse low-attenuation within the subcortical and periventricular white matter compatible with chronic microvascular disease. Vascular: No hyperdense vessel or unexpected calcification. Skull: Normal. Negative for fracture or focal lesion. Sinuses/Orbits: No acute finding. Other: Large right posterior scalp laceration and small subgaleal hematoma identified. CT CERVICAL SPINE FINDINGS Alignment: Normal alignment of the cervical spine. Skull base and vertebrae: The vertebral body heights are  well preserved. The facet joints all appear well aligned. There is no fracture or dislocation. Soft tissues and spinal canal: No prevertebral fluid or swelling. No visible canal hematoma. Disc levels: There is multilevel disc space narrowing and endplate spurring noted. This is most notable at C2-3 C4-5 and C5-6. Upper chest: Negative. Other: None IMPRESSION: 1. No acute intracranial abnormalities. 2. Chronic small vessel ischemic change and brain atrophy. 3. Large right posterior scalp laceration and small subgaleal hematoma. 4. No evidence for cervical spine fracture. Electronically Signed   By: Kerby Moors M.D.   On: 07/13/2020 07:49   CT Cervical Spine Wo Contrast  Result Date: 07/13/2020 CLINICAL DATA:  Un witnessed fall.  Head trauma.  Neck trauma. EXAM: CT HEAD WITHOUT CONTRAST CT CERVICAL SPINE WITHOUT CONTRAST TECHNIQUE: Multidetector CT imaging of the head and cervical spine was performed following the standard protocol without intravenous contrast. Multiplanar CT image reconstructions of the cervical spine were also generated. COMPARISON:  03/12/2018 FINDINGS: CT HEAD FINDINGS Brain: No evidence of acute infarction, hemorrhage, hydrocephalus, extra-axial collection or mass lesion/mass effect. Marked prominence of sulci and ventricles compatible with brain atrophy. There is moderate to marked diffuse low-attenuation within the subcortical and periventricular white matter compatible with chronic microvascular disease. Vascular: No hyperdense vessel or unexpected calcification. Skull: Normal. Negative for fracture or focal lesion. Sinuses/Orbits: No acute finding. Other: Large right posterior scalp laceration and small subgaleal hematoma identified. CT CERVICAL SPINE FINDINGS Alignment: Normal alignment of the cervical spine. Skull base and vertebrae: The vertebral body heights are well preserved. The facet joints all appear well aligned. There is no fracture or dislocation. Soft tissues and spinal  canal: No prevertebral fluid or swelling. No visible canal hematoma. Disc levels: There is multilevel disc space narrowing and endplate spurring noted. This is most notable at C2-3 C4-5 and C5-6. Upper chest: Negative. Other: None IMPRESSION: 1. No acute intracranial abnormalities. 2. Chronic small vessel ischemic change and brain atrophy. 3. Large right posterior scalp laceration and small subgaleal hematoma. 4. No evidence for cervical spine fracture. Electronically Signed   By: Kerby Moors M.D.   On: 07/13/2020 07:49   DG C-Arm 1-60 Min  Result Date: 07/13/2020 CLINICAL DATA:  Right hip arthroplasty. EXAM: DG C-ARM 1-60 MIN; OPERATIVE RIGHT HIP WITH PELVIS FLUOROSCOPY TIME:  Fluoroscopy Time:  15 seconds Radiation Exposure Index (if provided by the fluoroscopic device): 1.71 mGy Number of Acquired Spot Images: 4 COMPARISON:  Preoperative radiograph 07/13/2020 FINDINGS: Four fluoroscopic spot views obtained in the operating room of the right hip and pelvis during right hip arthroplasty. IMPRESSION: Fluoroscopic spot views during right hip arthroplasty. Electronically Signed   By: Keith Rake M.D.   On: 07/13/2020 18:48   DG HIP OPERATIVE UNILAT WITH PELVIS RIGHT  Result Date: 07/13/2020 CLINICAL DATA:  Right hip arthroplasty. EXAM: DG C-ARM 1-60 MIN; OPERATIVE RIGHT HIP WITH PELVIS FLUOROSCOPY TIME:  Fluoroscopy Time:  15 seconds Radiation Exposure Index (if provided by the fluoroscopic device): 1.71 mGy Number of Acquired Spot Images: 4 COMPARISON:  Preoperative radiograph 07/13/2020 FINDINGS: Four fluoroscopic spot views obtained in the operating room of the right hip and pelvis during right hip arthroplasty. IMPRESSION: Fluoroscopic spot views during right hip arthroplasty. Electronically Signed   By: Keith Rake M.D.   On: 07/13/2020 18:48   DG HIP UNILAT WITH PELVIS 2-3 VIEWS RIGHT  Result Date: 07/13/2020 CLINICAL DATA:  Fall, hip pain, dementia EXAM: DG HIP (WITH OR WITHOUT PELVIS)  2-3V RIGHT COMPARISON:  None. FINDINGS: There is an acute displaced and angulated right hip subcapital femoral neck fracture. Femoral head remains located. Bones are osteopenic. Degenerative changes and scoliosis of lower lumbar spine. Nonobstructive bowel gas pattern. IMPRESSION: Acute right hip subcapital femoral neck fracture. Osteopenia Electronically Signed   By: Jerilynn Mages.  Shick M.D.   On: 07/13/2020 08:23       LAB RESULTS: Basic Metabolic Panel: Recent Labs  Lab 07/17/20 0123 07/18/20 1150  NA 139 140  K 4.3 3.5  CL 109 105  CO2 23 27  GLUCOSE 92 113*  BUN 32* 25*  CREATININE 0.85 0.73  CALCIUM 8.2* 8.4*  MG 2.0  --   PHOS 2.4*  --    Liver Function  Tests: Recent Labs  Lab 07/15/20 0507 07/17/20 0123  ALBUMIN 2.4* 2.1*   No results for input(s): LIPASE, AMYLASE in the last 168 hours. No results for input(s): AMMONIA in the last 168 hours. CBC: Recent Labs  Lab 07/17/20 0123 07/18/20 1150  WBC 9.9 9.9  NEUTROABS 5.9  --   HGB 7.3* 7.7*  HCT 22.6* 23.9*  MCV 93.4 93.0  PLT 189 259   Cardiac Enzymes: No results for input(s): CKTOTAL, CKMB, CKMBINDEX, TROPONINI in the last 168 hours. BNP: Invalid input(s): POCBNP CBG: Recent Labs  Lab 07/15/20 2256  GLUCAP 109*       Disposition and Follow-up: Discharge Instructions    If the dressing is still on your incision site when you go home, remove it on the third day after your surgery date. Remove dressing if it begins to fall off, or if it is dirty or damaged before the third day.   Complete by: As directed    Increase activity slowly   Complete by: As directed        DISPOSITION: [facility] information for follow-up providers    [place], Aaron Edelman, MD. Schedule an appointment as soon as possible for a visit in 2 weeks.   Specialty: Orthopedic Surgery Why: For wound re-check, For suture removal Contact information: [address] [place]  [place] [address] [phone]        Merrilee Seashore, MD. Schedule an appointment as soon as possible for a visit in 2 week(s).   Specialty: Internal Medicine Contact information: [address] [place] [address] [phone]            Contact information for after-discharge care    Destination    [facility] .   Service: Skilled Nursing Contact information: [place] [phone]                   Time coordinating discharge:  35 minutes  Signed:   Estill Cotta M.D. [facility] 07/19/2020, 11:46 AM

## 2020-07-19 NOTE — Care Management Important Message (Signed)
Important Message  Patient Details  Name: [name] MRN: [id number] Date of Birth: [birth year]   Medicare Important Message Given:  Yes     Doctor Sheahan P [place] 07/19/2020, 11:46 AM

## 2020-07-19 NOTE — Progress Notes (Signed)
Physical Therapy Treatment Patient Details Name: [name] MRN: [id number] DOB: 1926-05-08 Today's Date: 07/19/2020    History of Present Illness The pt is a 87 yo female presenting from assisted living on 5/27 after unwitnessed fall. This is the patient's 4th fall this month. Imaging revealed: no acute intercranial abnormality, and R femoral neck fx. Pt is now s/p R THA on 5/27. PMH includes: dementia, HTN, HLD, afib, CAD, and intraventricular hemorrhage with residual L-sided deficits.    PT Comments    Pt supine in bed on arrival this session.  Cervical bending to R and trunk to L.  Pt required assistance after transfer to recliner to achieve a more neutral position.  SPO2 remains greater than 90%.  Pt with generalized pain to movement and continues to require total assistance. Plan for d/c today per RN.     Follow Up Recommendations  Supervision/Assistance - 24 hour (HHPT @ ALF.)     Equipment Recommendations  None recommended by PT    Recommendations for Other Services       Precautions / Restrictions Precautions Precautions: Fall Precaution Comments: dementia and L sided residual weakness at baseline Restrictions Weight Bearing Restrictions: Yes RLE Weight Bearing: Weight bearing as tolerated    Mobility  Bed Mobility Overal bed mobility: Needs Assistance Bed Mobility: Supine to Sit     Supine to sit: Total assist     General bed mobility comments: total assistance to move hips and LEs to edge of bed.  Continued assistance to elevate trunk into a seated position.  Pt leaning posterior and to the L with cervical bending to the R.  her neck is very tight today.  Attempted short bout of stretching to cervical region to neutral but patient unable to tolerate due to pain.    Transfers Overall transfer level: Needs assistance Equipment used: None Transfers: Squat Pivot Transfers Sit to Stand: From elevated surface (high to low surface.)   Squat pivot transfers:  Total assist     General transfer comment: Pt required assistance of total +1 to move her hips from high to low surface.  Tactile cues for forward weight shifting.  PTA performed pivot of hips from surface to surface.  pt remains to provide little to no support or effort on her behalf.  Ambulation/Gait                 Stairs             Wheelchair Mobility    Modified Rankin (Stroke Patients Only)       Balance     Sitting balance-Leahy Scale: Poor Sitting balance - Comments: posterior and L lateral lean Postural control: Posterior lean;Left lateral lean   Standing balance-Leahy Scale: Zero                              Cognition Arousal/Alertness: Lethargic Behavior During Therapy: Flat affect;Agitated (Pt very flat at baseline with intermittent bursts of agitation due to dementia.) Overall Cognitive Status: Within Functional Limits for tasks assessed                                 General Comments: Dementia at baseline.  Intermittent agitatiation but able to redirect.      Exercises General Exercises - Lower Extremity Long Arc Quad: AAROM;PROM;Both;5 reps;Seated (intermittent AAROM but this was no consistent so 80% of reps  were PROM.)    General Comments        Pertinent Vitals/Pain Pain Assessment: Faces Faces Pain Scale: Hurts a little bit Pain Location: generalized discomfort with mobility. Pain Descriptors / Indicators: Grimacing;Guarding Pain Intervention(s): Monitored during session;Repositioned    Home Living                      Prior Function            PT Goals (current goals can now be found in the care plan section) Acute Rehab PT Goals Potential to Achieve Goals: Fair Progress towards PT goals: Progressing toward goals    Frequency    Min 2X/week      PT Plan Current plan remains appropriate    Co-evaluation              AM-PAC PT "6 Clicks" Mobility   Outcome Measure   Help needed turning from your back to your side while in a flat bed without using bedrails?: Total Help needed moving from lying on your back to sitting on the side of a flat bed without using bedrails?: Total Help needed moving to and from a bed to a chair (including a wheelchair)?: Total Help needed standing up from a chair using your arms (e.g., wheelchair or bedside chair)?: Total Help needed to walk in hospital room?: Total Help needed climbing 3-5 steps with a railing? : Total 6 Click Score: 6    End of Session Equipment Utilized During Treatment: Oxygen Activity Tolerance: Patient limited by lethargy Patient left: in chair;with call bell/phone within reach;with chair alarm set Nurse Communication: Mobility status PT Visit Diagnosis: Other abnormalities of gait and mobility (R26.89);Muscle weakness (generalized) (M62.81)     Time: 1030-1046 PT Time Calculation (min) (ACUTE ONLY): 16 min  Charges:  $Gait Training: 8-22 mins $Therapeutic Activity: 8-22 mins                     [name] , PTA Acute Rehabilitation Services Pager [phone] Office [phone]     [name] [name] 07/19/2020, 10:58 AM

## 2020-07-19 NOTE — Progress Notes (Signed)
Report was called to RN at [facility]. IV was removed with catheter intact. Waiting on PTAR for transportation to facility.

## 2020-07-19 NOTE — Plan of Care (Signed)
  Problem: Health Behavior/Discharge Planning: Goal: Ability to manage health-related needs will improve Outcome: Adequate for Discharge   Problem: Clinical Measurements: Goal: Ability to maintain clinical measurements within normal limits will improve Outcome: Adequate for Discharge Goal: Will remain free from infection Outcome: Adequate for Discharge Goal: Diagnostic test results will improve Outcome: Adequate for Discharge Goal: Respiratory complications will improve Outcome: Adequate for Discharge Goal: Cardiovascular complication will be avoided Outcome: Adequate for Discharge   Problem: Activity: Goal: Risk for activity intolerance will decrease Outcome: Adequate for Discharge   Problem: Nutrition: Goal: Adequate nutrition will be maintained Outcome: Adequate for Discharge   Problem: Coping: Goal: Level of anxiety will decrease Outcome: Adequate for Discharge   Problem: Elimination: Goal: Will not experience complications related to bowel motility Outcome: Adequate for Discharge Goal: Will not experience complications related to urinary retention Outcome: Adequate for Discharge   Problem: Pain Managment: Goal: General experience of comfort will improve Outcome: Adequate for Discharge   Problem: Safety: Goal: Ability to remain free from injury will improve Outcome: Adequate for Discharge   Problem: Skin Integrity: Goal: Risk for impaired skin integrity will decrease Outcome: Adequate for Discharge   Problem: Acute Rehab PT Goals(only PT should resolve) Goal: Pt Will Go Supine/Side To Sit Outcome: Adequate for Discharge Goal: Patient Will Perform Sitting Balance Outcome: Adequate for Discharge Goal: Patient Will Transfer Sit To/From Stand Outcome: Adequate for Discharge Goal: Pt Will Transfer Bed To Chair/Chair To Bed Outcome: Adequate for Discharge Goal: Pt Will Ambulate Outcome: Adequate for Discharge Goal: Pt/caregiver will Perform Home Exercise  Program Outcome: Adequate for Discharge   Problem: Acute Rehab OT Goals (only OT should resolve) Goal: Pt. Will Perform Eating Outcome: Adequate for Discharge Goal: Pt. Will Perform Grooming Outcome: Adequate for Discharge Goal: OT Additional ADL Goal #1 Outcome: Adequate for Discharge   Problem: SLP Dysphagia Goals Goal: Patient will utilize recommended strategies Description: Patient will utilize recommended strategies during swallow to increase swallowing safety with Outcome: Adequate for Discharge

## 2020-07-23 DIAGNOSIS — I2510 Atherosclerotic heart disease of native coronary artery without angina pectoris: Secondary | ICD-10-CM | POA: Diagnosis not present

## 2020-07-23 DIAGNOSIS — I10 Essential (primary) hypertension: Secondary | ICD-10-CM | POA: Diagnosis not present

## 2020-07-23 DIAGNOSIS — F0390 Unspecified dementia without behavioral disturbance: Secondary | ICD-10-CM | POA: Diagnosis not present

## 2020-07-23 DIAGNOSIS — R2681 Unsteadiness on feet: Secondary | ICD-10-CM | POA: Diagnosis not present

## 2020-07-23 DIAGNOSIS — I69354 Hemiplegia and hemiparesis following cerebral infarction affecting left non-dominant side: Secondary | ICD-10-CM | POA: Diagnosis not present

## 2020-07-23 DIAGNOSIS — D649 Anemia, unspecified: Secondary | ICD-10-CM | POA: Diagnosis not present

## 2020-07-23 DIAGNOSIS — S72001S Fracture of unspecified part of neck of right femur, sequela: Secondary | ICD-10-CM | POA: Diagnosis not present

## 2020-07-23 DIAGNOSIS — M6281 Muscle weakness (generalized): Secondary | ICD-10-CM | POA: Diagnosis not present

## 2020-07-23 DIAGNOSIS — E782 Mixed hyperlipidemia: Secondary | ICD-10-CM | POA: Diagnosis not present

## 2020-07-23 DIAGNOSIS — K219 Gastro-esophageal reflux disease without esophagitis: Secondary | ICD-10-CM | POA: Diagnosis not present

## 2020-07-23 DIAGNOSIS — F419 Anxiety disorder, unspecified: Secondary | ICD-10-CM | POA: Diagnosis not present

## 2020-07-23 DIAGNOSIS — I4891 Unspecified atrial fibrillation: Secondary | ICD-10-CM | POA: Diagnosis not present

## 2020-07-24 DIAGNOSIS — K219 Gastro-esophageal reflux disease without esophagitis: Secondary | ICD-10-CM | POA: Diagnosis not present

## 2020-07-24 DIAGNOSIS — Z9189 Other specified personal risk factors, not elsewhere classified: Secondary | ICD-10-CM | POA: Diagnosis not present

## 2020-07-24 DIAGNOSIS — R262 Difficulty in walking, not elsewhere classified: Secondary | ICD-10-CM | POA: Diagnosis not present

## 2020-07-24 DIAGNOSIS — K5901 Slow transit constipation: Secondary | ICD-10-CM | POA: Diagnosis not present

## 2020-07-24 DIAGNOSIS — I2510 Atherosclerotic heart disease of native coronary artery without angina pectoris: Secondary | ICD-10-CM | POA: Diagnosis not present

## 2020-07-24 DIAGNOSIS — I480 Paroxysmal atrial fibrillation: Secondary | ICD-10-CM | POA: Diagnosis not present

## 2020-07-24 DIAGNOSIS — R0989 Other specified symptoms and signs involving the circulatory and respiratory systems: Secondary | ICD-10-CM | POA: Diagnosis not present

## 2020-07-24 DIAGNOSIS — S7291XA Unspecified fracture of right femur, initial encounter for closed fracture: Secondary | ICD-10-CM | POA: Diagnosis not present

## 2020-07-24 DIAGNOSIS — G8190 Hemiplegia, unspecified affecting unspecified side: Secondary | ICD-10-CM | POA: Diagnosis not present

## 2020-07-24 DIAGNOSIS — D62 Acute posthemorrhagic anemia: Secondary | ICD-10-CM | POA: Diagnosis not present

## 2020-07-24 DIAGNOSIS — Z889 Allergy status to unspecified drugs, medicaments and biological substances status: Secondary | ICD-10-CM | POA: Diagnosis not present

## 2020-07-24 DIAGNOSIS — F0280 Dementia in other diseases classified elsewhere without behavioral disturbance: Secondary | ICD-10-CM | POA: Diagnosis not present

## 2020-07-30 DIAGNOSIS — F419 Anxiety disorder, unspecified: Secondary | ICD-10-CM | POA: Diagnosis not present

## 2020-07-30 DIAGNOSIS — I10 Essential (primary) hypertension: Secondary | ICD-10-CM | POA: Diagnosis not present

## 2020-07-30 DIAGNOSIS — I69354 Hemiplegia and hemiparesis following cerebral infarction affecting left non-dominant side: Secondary | ICD-10-CM | POA: Diagnosis not present

## 2020-07-30 DIAGNOSIS — K219 Gastro-esophageal reflux disease without esophagitis: Secondary | ICD-10-CM | POA: Diagnosis not present

## 2020-07-30 DIAGNOSIS — F0390 Unspecified dementia without behavioral disturbance: Secondary | ICD-10-CM | POA: Diagnosis not present

## 2020-07-30 DIAGNOSIS — D649 Anemia, unspecified: Secondary | ICD-10-CM | POA: Diagnosis not present

## 2020-07-30 DIAGNOSIS — E782 Mixed hyperlipidemia: Secondary | ICD-10-CM | POA: Diagnosis not present

## 2020-07-30 DIAGNOSIS — I2510 Atherosclerotic heart disease of native coronary artery without angina pectoris: Secondary | ICD-10-CM | POA: Diagnosis not present

## 2020-07-30 DIAGNOSIS — R2681 Unsteadiness on feet: Secondary | ICD-10-CM | POA: Diagnosis not present

## 2020-07-30 DIAGNOSIS — M6281 Muscle weakness (generalized): Secondary | ICD-10-CM | POA: Diagnosis not present

## 2020-07-30 DIAGNOSIS — I4891 Unspecified atrial fibrillation: Secondary | ICD-10-CM | POA: Diagnosis not present

## 2020-07-30 DIAGNOSIS — S72001S Fracture of unspecified part of neck of right femur, sequela: Secondary | ICD-10-CM | POA: Diagnosis not present

## 2020-07-31 DIAGNOSIS — D72828 Other elevated white blood cell count: Secondary | ICD-10-CM | POA: Diagnosis not present

## 2020-07-31 DIAGNOSIS — D62 Acute posthemorrhagic anemia: Secondary | ICD-10-CM | POA: Diagnosis not present

## 2020-07-31 DIAGNOSIS — Z96641 Presence of right artificial hip joint: Secondary | ICD-10-CM | POA: Diagnosis not present

## 2020-07-31 DIAGNOSIS — R0989 Other specified symptoms and signs involving the circulatory and respiratory systems: Secondary | ICD-10-CM | POA: Diagnosis not present

## 2020-07-31 DIAGNOSIS — G8190 Hemiplegia, unspecified affecting unspecified side: Secondary | ICD-10-CM | POA: Diagnosis not present

## 2020-07-31 DIAGNOSIS — F0280 Dementia in other diseases classified elsewhere without behavioral disturbance: Secondary | ICD-10-CM | POA: Diagnosis not present

## 2020-07-31 DIAGNOSIS — I480 Paroxysmal atrial fibrillation: Secondary | ICD-10-CM | POA: Diagnosis not present

## 2020-07-31 DIAGNOSIS — S72031D Displaced midcervical fracture of right femur, subsequent encounter for closed fracture with routine healing: Secondary | ICD-10-CM | POA: Diagnosis not present

## 2020-07-31 DIAGNOSIS — R55 Syncope and collapse: Secondary | ICD-10-CM | POA: Diagnosis not present

## 2020-08-03 ENCOUNTER — Other Ambulatory Visit: Payer: Self-pay | Admitting: *Deleted

## 2020-08-03 NOTE — Patient Outreach (Addendum)
Verified in [facility] (Patient [name]) that member resides in [facility]. Member screened for potential [facility] Care Management needs.   Update received from [facility] indicating transition plan is for ALF if member meets criteria.   Will continue to follow.   Marthenia Rolling, MSN, RN,BSN [place] Acute Care Coordinator [phone] [facility]) [phone]  (Toll free office)

## 2020-08-14 DIAGNOSIS — D62 Acute posthemorrhagic anemia: Secondary | ICD-10-CM | POA: Diagnosis not present

## 2020-08-14 DIAGNOSIS — R0989 Other specified symptoms and signs involving the circulatory and respiratory systems: Secondary | ICD-10-CM | POA: Diagnosis not present

## 2020-08-14 DIAGNOSIS — Z96641 Presence of right artificial hip joint: Secondary | ICD-10-CM | POA: Diagnosis not present

## 2020-08-14 DIAGNOSIS — I4891 Unspecified atrial fibrillation: Secondary | ICD-10-CM | POA: Diagnosis not present

## 2020-08-14 DIAGNOSIS — K219 Gastro-esophageal reflux disease without esophagitis: Secondary | ICD-10-CM | POA: Diagnosis not present

## 2020-08-14 DIAGNOSIS — K5901 Slow transit constipation: Secondary | ICD-10-CM | POA: Diagnosis not present

## 2020-08-14 DIAGNOSIS — F0390 Unspecified dementia without behavioral disturbance: Secondary | ICD-10-CM | POA: Diagnosis not present

## 2020-08-16 ENCOUNTER — Other Ambulatory Visit: Payer: Self-pay

## 2020-08-16 ENCOUNTER — Non-Acute Institutional Stay: Payer: Medicare Other | Admitting: Student

## 2020-08-16 DIAGNOSIS — R2681 Unsteadiness on feet: Secondary | ICD-10-CM | POA: Diagnosis not present

## 2020-08-16 DIAGNOSIS — I10 Essential (primary) hypertension: Secondary | ICD-10-CM | POA: Diagnosis not present

## 2020-08-16 DIAGNOSIS — D649 Anemia, unspecified: Secondary | ICD-10-CM | POA: Diagnosis not present

## 2020-08-16 DIAGNOSIS — I69354 Hemiplegia and hemiparesis following cerebral infarction affecting left non-dominant side: Secondary | ICD-10-CM | POA: Diagnosis not present

## 2020-08-16 DIAGNOSIS — M6281 Muscle weakness (generalized): Secondary | ICD-10-CM | POA: Diagnosis not present

## 2020-08-16 DIAGNOSIS — I69391 Dysphagia following cerebral infarction: Secondary | ICD-10-CM | POA: Diagnosis not present

## 2020-08-16 DIAGNOSIS — K219 Gastro-esophageal reflux disease without esophagitis: Secondary | ICD-10-CM | POA: Diagnosis not present

## 2020-08-16 DIAGNOSIS — E782 Mixed hyperlipidemia: Secondary | ICD-10-CM | POA: Diagnosis not present

## 2020-08-16 DIAGNOSIS — R531 Weakness: Secondary | ICD-10-CM | POA: Diagnosis not present

## 2020-08-16 DIAGNOSIS — I4891 Unspecified atrial fibrillation: Secondary | ICD-10-CM | POA: Diagnosis not present

## 2020-08-16 DIAGNOSIS — F0390 Unspecified dementia without behavioral disturbance: Secondary | ICD-10-CM | POA: Diagnosis not present

## 2020-08-16 DIAGNOSIS — I2510 Atherosclerotic heart disease of native coronary artery without angina pectoris: Secondary | ICD-10-CM | POA: Diagnosis not present

## 2020-08-16 DIAGNOSIS — F419 Anxiety disorder, unspecified: Secondary | ICD-10-CM | POA: Diagnosis not present

## 2020-08-16 DIAGNOSIS — Z515 Encounter for palliative care: Secondary | ICD-10-CM | POA: Diagnosis not present

## 2020-08-16 DIAGNOSIS — S72001S Fracture of unspecified part of neck of right femur, sequela: Secondary | ICD-10-CM | POA: Diagnosis not present

## 2020-08-16 DIAGNOSIS — R630 Anorexia: Secondary | ICD-10-CM | POA: Diagnosis not present

## 2020-08-16 NOTE — Progress Notes (Signed)
[facility] Consult Note Telephone: [phone]  Fax: [phone]   Date of encounter: 08/16/20 PATIENT NAME: [name] [address] [place] [address]   [phone] (home)  DOB: 29-Dec-1926 MRN: [id number] PRIMARY CARE PROVIDER:    Dr. [facility].  REFERRING PROVIDER:   Dr. Doree Barthel  RESPONSIBLE PARTY:    Contact Information     Name Relation Home Work [place] Daughter [phone]  [phone]   Sharlot Gowda [phone]  [phone]   Soundra Pilon [phone]  [phone]        I met face to face with patient in the facility. Daughter [name] via telephone. Palliative Care was asked to follow this patient by consultation request of  Dr. Keenan Bachelor to address advance care planning and complex medical decision making. This is the initial visit.                                     ASSESSMENT AND PLAN / RECOMMENDATIONS:   Advance Care Planning/Goals of Care: Goals include to maximize quality of life and symptom management. Continue therapy as directed. Our advance care planning conversation included a discussion about:    The value and importance of advance care planning  Experiences with loved ones who have been seriously ill or have died  Exploration of personal, cultural or spiritual beliefs that might influence medical decisions  Exploration of goals of care in the event of a sudden injury or illness  MOST form in place; reviewed.  CODE STATUS: DNR   Discussed Palliative Medicine vs hospice services. Palliative will continue to provide support. Daughter is unsure of plan once she completes therapy. She expresses that patient would likely not be able to return home and would need additional care/support.   Symptom Management/Plan:  Generalized weakness-secondary to hx of CVA, recent right hip fracture. Recommend continuing  PT/OT as directed. Staff to continue assisting with adl's.   Decline in appetite/weight loss-continue medpass supplement TID, routine weights. Offer foods patient enjoys. Staff to continue assisting with feeding. Continue mirtazapine 7.5 mg QHS.  Dysphagia-secondary to CVA. continue ST as directed. Dysphagia diet per facility; upgrade diet as tolerated. Aspiration precautions.   Follow up Palliative Care Visit: Palliative care will continue to follow for complex medical decision making, advance care planning, and clarification of goals. Return in 4 weeks or prn.  I spent 45 minutes providing this consultation. More than 50% of the time in this consultation was spent in counseling and care coordination.   PPS: 40%  HOSPICE ELIGIBILITY/DIAGNOSIS: TBD  Chief Complaint: Palliative Medicine initial consult.  HISTORY OF PRESENT ILLNESS:  [name] is a 87 y.o. year old female with hx of CVA, left sided hemiplegia, GERD, hypotension, paroxysmal atrial fibrillation, seizures. Hospitalized 5/27-07/19/2020 due to closed right hip fracture.   Patient currently at [facility]. She is receiving OT/PT/ST services. She lived with her daughter for 15 years, then [employer]. Staff report patient with poor appetite. She requires assistance with all adl's. No recent falls reported. Patient denies having pain; receives routine acetaminophen with relief.  Patient received resting in bed. She is able to answer orientation questions correctly although she keeps her eyes closed most of visit. She denies pain/discomfort. Staff report she is being fed, poor appetite overall. She is receiving therapy.    History obtained from  review of EMR, discussion with primary team, and interview with family, facility staff/caregiver and/or Ms. Sieler.  I reviewed available labs, medications, imaging, studies and related documents from the EMR.  Records reviewed and summarized above.   ROS  General:  NAD EYES: denies vision changes ENMT: dysphagia Cardiovascular: denies chest pain, denies DOE Pulmonary: denies cough, denies increased SOB Abdomen: denies constipation GU: denies dysuria MSK: weakness,  no falls reported Skin: fragile skin, superficial skin tears to LE per wound nurse Neurological: denies pain, denies insomnia Psych: Endorses positive mood Heme/lymph/immuno: denies bruises, abnormal bleeding  Physical Exam: Weights: 141.2 pounds 08/07/20, 147.8 pounds 07/20/20 Constitutional: NAD General: frail appearing EYES: anicteric sclera, lids intact, no discharge  ENMT: intact hearing, oral mucous membranes moist CV: S1S2, RRR, no LE edema Pulmonary: LCTA, no increased work of breathing, no cough, room air Abdomen: normo-active BS + 4 quadrants, soft and non tender GU: deferred MSK: moves all extremities, weakness Skin: warm and dry, dressings to upper and lower extremities Neuro: generalized weakness, A & O x 2 Psych: non-anxious affect, pleasant affect Hem/lymph/immuno: no widespread bruising CURRENT PROBLEM LIST:  Patient Active Problem List   Diagnosis Date Noted   Closed right hip fracture ([place]) 07/13/2020   Scalp laceration 07/13/2020   DNR (do not resuscitate) 07/13/2020   Fall 07/13/2020   Pneumothorax on left 06/30/2019   Lewy body dementia ([place]) 05/18/2019   Recurrent UTI--E coli/Pseudomonas 01/18/2018   Orthostasis 01/18/2018   Benign essential HTN    Cerebral edema (HCC)    Hypomagnesemia    Dysphagia, post-stroke    Lethargy    ABLA (acute blood loss anemia)    Hypoalbuminemia due to protein-calorie malnutrition ([place])    History of Barrett's esophagus    Urinary retention    Seizure prophylaxis    E. coli UTI    Intraventricular hemorrhage (HCC)    Urinary tract infection without hematuria    Coronary artery disease involving native coronary artery of native heart without angina pectoris    Pulmonary HTN (HCC)    Leukocytosis    Intracranial  hemorrhage (HCC) 12/18/2017   Syncope 04/10/2015   Sepsis secondary to UTI ([place]) 04/10/2015   URI (upper respiratory infection) 04/10/2015   UTI (lower urinary tract infection) 04/10/2015   QT prolongation 10/12/2014   Headache 09/02/2012   Tricuspid valve regurgitation, moderate 08/18/2012   Hypokalemia 08/16/2012   Hyponatremia 08/16/2012   Atrial fibrillation ([place])    Hypertension    Anxiety    Mixed hyperlipidemia 09/11/2009   GERD 08/30/2008   CHEST PAIN 08/30/2008   Coronary atherosclerosis 04/04/2008   Syncope and collapse 04/04/2008   PAST MEDICAL HISTORY:  Active Ambulatory Problems    Diagnosis Date Noted   Mixed hyperlipidemia 09/11/2009   Coronary atherosclerosis 04/04/2008   GERD 08/30/2008   Syncope and collapse 04/04/2008   CHEST PAIN 08/30/2008   Atrial fibrillation ([place])    Hypertension    Anxiety    Hypokalemia 08/16/2012   Hyponatremia 08/16/2012   Tricuspid valve regurgitation, moderate 08/18/2012   Headache 09/02/2012   QT prolongation 10/12/2014   Syncope 04/10/2015   Sepsis secondary to UTI ([place]) 04/10/2015   URI (upper respiratory infection) 04/10/2015   UTI (lower urinary tract infection) 04/10/2015   Intracranial hemorrhage ([place]) 12/18/2017   Intraventricular hemorrhage (HCC)    Urinary tract infection without hematuria    Coronary artery disease involving native coronary artery of native heart without angina pectoris    Pulmonary HTN ([place])  Leukocytosis    History of Barrett's esophagus    Urinary retention    Seizure prophylaxis    E. coli UTI    ABLA (acute blood loss anemia)    Hypoalbuminemia due to protein-calorie malnutrition (HCC)    Hypomagnesemia    Dysphagia, post-stroke    Lethargy    Cerebral edema (HCC)    Benign essential HTN    Recurrent UTI--E coli/Pseudomonas 01/18/2018   Orthostasis 01/18/2018   Lewy body dementia ([place]) 05/18/2019   Pneumothorax on left 06/30/2019   Closed right hip fracture ([place]) 07/13/2020    Scalp laceration 07/13/2020   DNR (do not resuscitate) 07/13/2020   Fall 07/13/2020   Resolved Ambulatory Problems    Diagnosis Date Noted   Essential hypertension, benign 04/04/2008   Near syncope 08/16/2012   UTI (lower urinary tract infection) 04/10/2015   ICH (intracerebral hemorrhage) ([place]) 12/23/2017   Hypertensive crisis    Labile blood pressure    Steroid-induced hyperglycemia    Past Medical History:  Diagnosis Date   Ankle fracture    Arthritis    Barrett's esophagus    Chronic atrial fibrillation (HCC)    Coronary artery disease    Dementia (HCC)    Depression 2020   Dysrhythmia    GERD (gastroesophageal reflux disease)    Hiatal hernia    Hx of echocardiogram    Hyperlipidemia    Mitral regurgitation    Obesity    Pulmonary hypertension ([place])    Stroke ([place]) 12/18/2017   Tricuspid regurgitation    SOCIAL HX:  Social History   Tobacco Use   Smoking status: Former    Pack years: 0.00    Types: Cigarettes   Smokeless tobacco: Never  Substance Use Topics   Alcohol use: No    Alcohol/week: 0.0 standard drinks   FAMILY HX:  Family History  Problem Relation Age of Onset   Diabetes Mother    Heart attack Mother    Lung cancer Father    Thyroid disease Sister    Thyroid disease Sister    Hypertension Sister    Hypertension Sister    Hypertension Sister    Hypertension Sister    Hypertension Brother    Hypertension Brother    Breast cancer Other 48   Colon cancer Neg Hx    Colon polyps Neg Hx    Esophageal cancer Neg Hx    Stroke Neg Hx     ALLERGIES:  Allergies  Allergen Reactions   Amlodipine Shortness Of Breath, Swelling and Other (See Comments)    Edema in legs   Methyldopa Shortness Of Breath and Swelling   Shellfish-Derived Products Other (See Comments)    Syncopal episode   Tape Itching and Other (See Comments)    Also TEARS THE SKIN, SO PLEASE USE AN ALTERNATIVE!!   Actonel [Risedronate Sodium] Other (See Comments)    Caused a  lump to come up in throat and caused heartburn   Bactrim [Sulfamethoxazole-Trimethoprim] Itching   Ciprofloxacin Hcl Other (See Comments)    Confusion    Gas-X [Simethicone] Itching   Hydralazine Hcl Other (See Comments)    Unknown reaction, per patient    Lactose Intolerance (Gi) Diarrhea   Lisinopril Cough   Milk-Related Compounds Diarrhea   Other Other (See Comments)    Orange juice : itching   Oxycodone Hcl Other (See Comments)    Hallucinations    Pneumococcal Vaccines Swelling and Other (See Comments)    Arm swells   Valsartan Other (  See Comments)    Unknown reaction, per pt   Cozaar [Losartan] Rash   Digitek [Digoxin] Rash   Felodipine Rash   Hctz [Hydrochlorothiazide] Itching and Rash    Pt reports causing "itching under the skin"   Simvastatin Rash   Spironolactone Itching and Rash     PERTINENT MEDICATIONS:  Outpatient Encounter Medications as of 08/16/2020  Medication Sig   acetaminophen (TYLENOL) 500 MG tablet Take 1 tablet (500 mg total) by mouth every 6 (six) hours as needed for mild pain or moderate pain.   aspirin EC 325 MG EC tablet Take 1 tablet (325 mg total) by mouth daily with breakfast.   docusate sodium (COLACE) 100 MG capsule Take 1 capsule (100 mg total) by mouth 2 (two) times daily. (Patient not taking: No sig reported)   HYDROcodone-acetaminophen (NORCO/VICODIN) 5-325 MG tablet Take 1 tablet by mouth every 6 (six) hours as needed for moderate pain or severe pain.   irbesartan (AVAPRO) 75 MG tablet Take 75 mg by mouth daily.   levETIRAcetam (KEPPRA) 500 MG tablet Take 1 tablet (500 mg total) by mouth 2 (two) times daily. (Patient taking differently: Take 500 mg by mouth in the morning.)   MAGNESIUM CHLORIDE-CALCIUM PO Take 1 tablet by mouth daily.   Melatonin 10 MG TABS Take 10 mg by mouth at bedtime.   metoprolol tartrate (LOPRESSOR) 25 MG tablet Take 25 mg by mouth daily.   Multiple Vitamin (MULTIVITAMIN WITH MINERALS) TABS tablet Take 1 tablet by  mouth daily. (Patient not taking: No sig reported)   nitroGLYCERIN (NITROSTAT) 0.4 MG SL tablet Place 1 tablet (0.4 mg total) under the tongue every 5 (five) minutes as needed for chest pain (3 doses only). (Patient not taking: No sig reported)   omeprazole (PRILOSEC OTC) 20 MG tablet Take 20 mg by mouth daily.   pantoprazole (PROTONIX) 40 MG tablet Take 1 tablet (40 mg total) by mouth daily.   polyethylene glycol (MIRALAX / GLYCOLAX) packet Take 17 g by mouth daily. (Patient not taking: No sig reported)   sertraline (ZOLOFT) 50 MG tablet Take 50 mg by mouth daily.   simethicone (MYLICON) 80 MG chewable tablet Chew 1 tablet (80 mg total) by mouth 4 (four) times daily as needed for flatulence. (Patient not taking: No sig reported)   No facility-administered encounter medications on file as of 08/16/2020.   Thank you for the opportunity to participate in the care of Ms. Christiano.  The palliative care team will continue to follow. Please call our office at [phone] if we can be of additional assistance.  Ezekiel Slocumb, NP   COVID-19 PATIENT SCREENING TOOL Asked and negative response unless otherwise noted:  Have you had symptoms of covid, tested positive or been in contact with someone with symptoms/positive test in the past 5-10 days? No

## 2020-08-20 DIAGNOSIS — I10 Essential (primary) hypertension: Secondary | ICD-10-CM | POA: Diagnosis not present

## 2020-08-20 DIAGNOSIS — I69354 Hemiplegia and hemiparesis following cerebral infarction affecting left non-dominant side: Secondary | ICD-10-CM | POA: Diagnosis not present

## 2020-08-20 DIAGNOSIS — I2510 Atherosclerotic heart disease of native coronary artery without angina pectoris: Secondary | ICD-10-CM | POA: Diagnosis not present

## 2020-08-20 DIAGNOSIS — D649 Anemia, unspecified: Secondary | ICD-10-CM | POA: Diagnosis not present

## 2020-08-20 DIAGNOSIS — E782 Mixed hyperlipidemia: Secondary | ICD-10-CM | POA: Diagnosis not present

## 2020-08-20 DIAGNOSIS — I4891 Unspecified atrial fibrillation: Secondary | ICD-10-CM | POA: Diagnosis not present

## 2020-08-20 DIAGNOSIS — M6281 Muscle weakness (generalized): Secondary | ICD-10-CM | POA: Diagnosis not present

## 2020-08-20 DIAGNOSIS — F0390 Unspecified dementia without behavioral disturbance: Secondary | ICD-10-CM | POA: Diagnosis not present

## 2020-08-20 DIAGNOSIS — F419 Anxiety disorder, unspecified: Secondary | ICD-10-CM | POA: Diagnosis not present

## 2020-08-20 DIAGNOSIS — R2681 Unsteadiness on feet: Secondary | ICD-10-CM | POA: Diagnosis not present

## 2020-08-20 DIAGNOSIS — S72001S Fracture of unspecified part of neck of right femur, sequela: Secondary | ICD-10-CM | POA: Diagnosis not present

## 2020-08-20 DIAGNOSIS — K219 Gastro-esophageal reflux disease without esophagitis: Secondary | ICD-10-CM | POA: Diagnosis not present

## 2020-08-27 DIAGNOSIS — S72031D Displaced midcervical fracture of right femur, subsequent encounter for closed fracture with routine healing: Secondary | ICD-10-CM | POA: Diagnosis not present

## 2020-08-28 DIAGNOSIS — K219 Gastro-esophageal reflux disease without esophagitis: Secondary | ICD-10-CM | POA: Diagnosis not present

## 2020-08-28 DIAGNOSIS — I10 Essential (primary) hypertension: Secondary | ICD-10-CM | POA: Diagnosis not present

## 2020-08-28 DIAGNOSIS — R2681 Unsteadiness on feet: Secondary | ICD-10-CM | POA: Diagnosis not present

## 2020-08-28 DIAGNOSIS — S72001S Fracture of unspecified part of neck of right femur, sequela: Secondary | ICD-10-CM | POA: Diagnosis not present

## 2020-08-28 DIAGNOSIS — I69354 Hemiplegia and hemiparesis following cerebral infarction affecting left non-dominant side: Secondary | ICD-10-CM | POA: Diagnosis not present

## 2020-08-28 DIAGNOSIS — F0390 Unspecified dementia without behavioral disturbance: Secondary | ICD-10-CM | POA: Diagnosis not present

## 2020-08-28 DIAGNOSIS — D649 Anemia, unspecified: Secondary | ICD-10-CM | POA: Diagnosis not present

## 2020-08-28 DIAGNOSIS — I2510 Atherosclerotic heart disease of native coronary artery without angina pectoris: Secondary | ICD-10-CM | POA: Diagnosis not present

## 2020-08-28 DIAGNOSIS — F419 Anxiety disorder, unspecified: Secondary | ICD-10-CM | POA: Diagnosis not present

## 2020-08-28 DIAGNOSIS — M6281 Muscle weakness (generalized): Secondary | ICD-10-CM | POA: Diagnosis not present

## 2020-08-28 DIAGNOSIS — I4891 Unspecified atrial fibrillation: Secondary | ICD-10-CM | POA: Diagnosis not present

## 2020-08-28 DIAGNOSIS — E782 Mixed hyperlipidemia: Secondary | ICD-10-CM | POA: Diagnosis not present

## 2020-08-30 DIAGNOSIS — N390 Urinary tract infection, site not specified: Secondary | ICD-10-CM | POA: Diagnosis not present

## 2020-08-30 DIAGNOSIS — S51811D Laceration without foreign body of right forearm, subsequent encounter: Secondary | ICD-10-CM | POA: Diagnosis not present

## 2020-08-30 DIAGNOSIS — S51011D Laceration without foreign body of right elbow, subsequent encounter: Secondary | ICD-10-CM | POA: Diagnosis not present

## 2020-08-30 IMAGING — CT CT HEAD W/O CM
4 series · 16 of 47 positions shown, 18 images · non-contrast
Comparison: Head CT scan 12/18/2017, 12/25/2017 and 12/29/2017.

CLINICAL DATA: The patient suffered an intracranial hemorrhage on
12/18/2017. No known injury. Subsequent encounter.

EXAM:
CT HEAD WITHOUT CONTRAST
TECHNIQUE: Contiguous axial images were obtained from the base of the skull
through the vertex without intravenous contrast.

[Series 3: head without · axial · non-contrast · 0.39mm/px · z∈[+1115,+1235]mm · 7 of 33 slices shown, 9 images]
[im 5/33  brain]
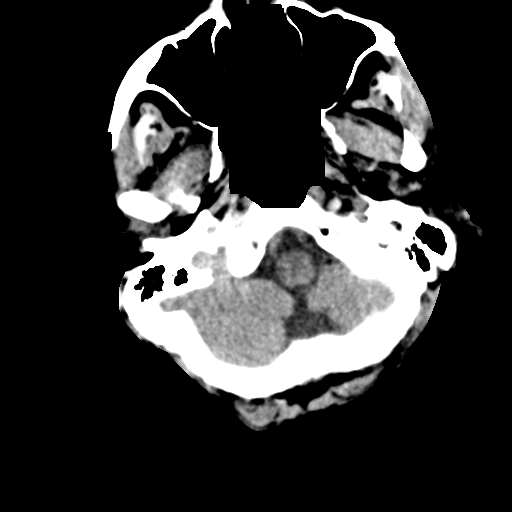
[im 5/33  bone]
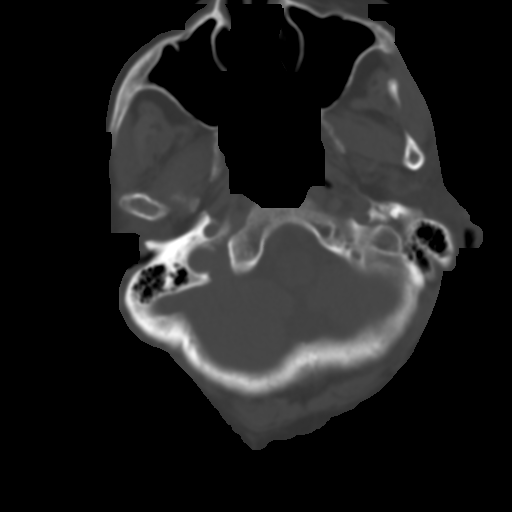
[im 9/33  brain]
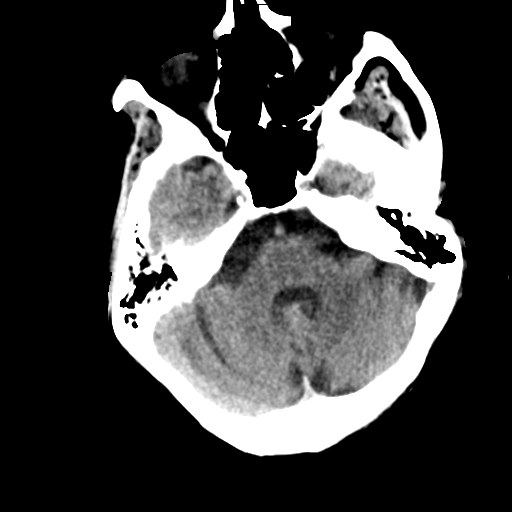
[im 13/33  brain]
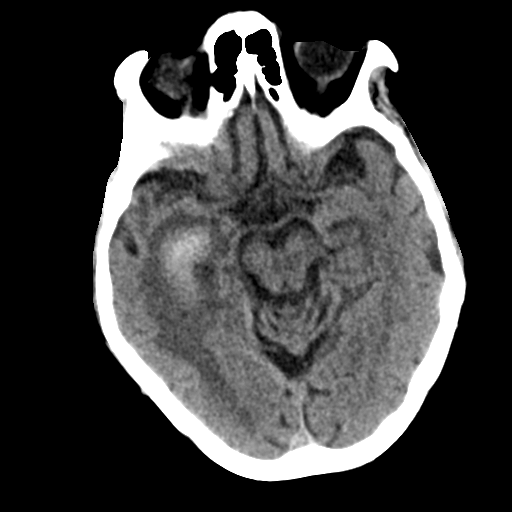
[im 17/33  brain]
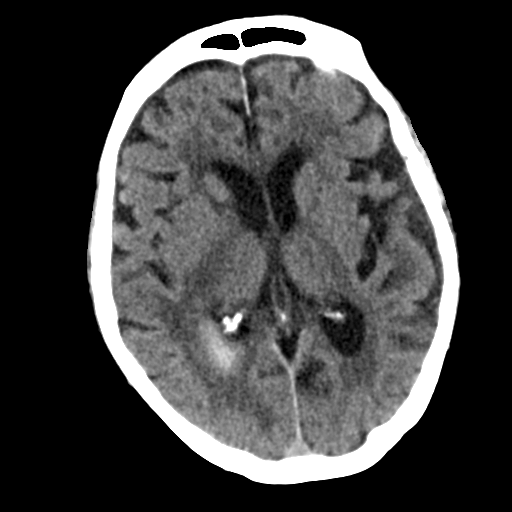
[im 21/33  brain]
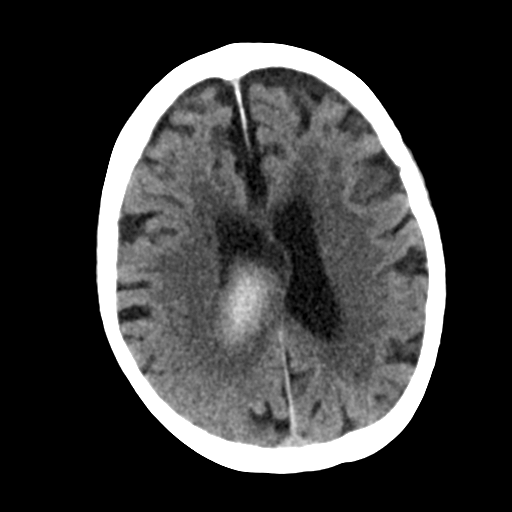
[im 21/33  bone]
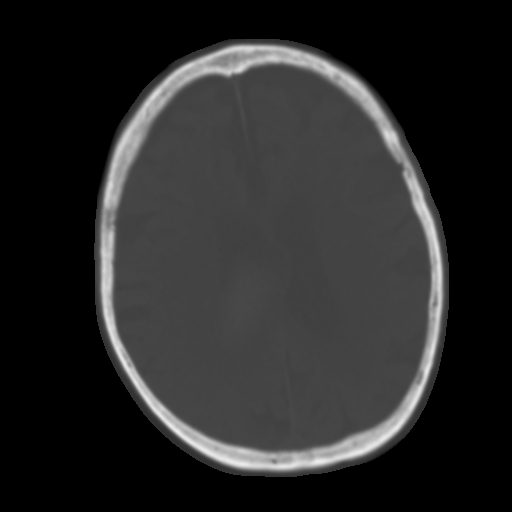
[im 25/33  brain]
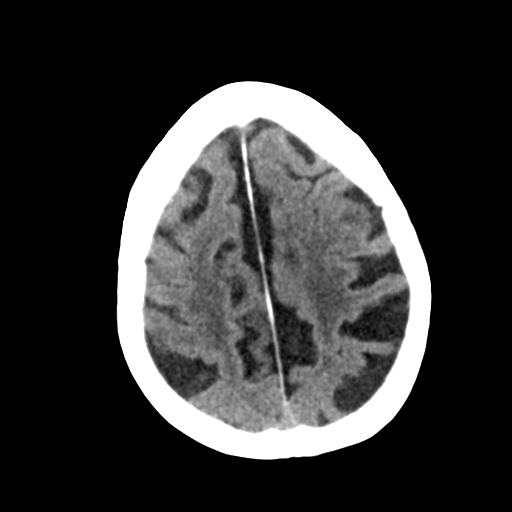
[im 29/33  brain]
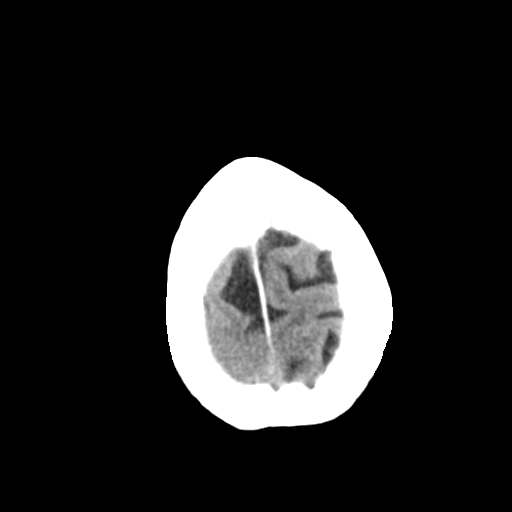

[Series 4: head bone · axial · 0.39mm/px · z∈[+1111,+1143]mm · 3 of 83 slices shown]
[im 9/83  bone]
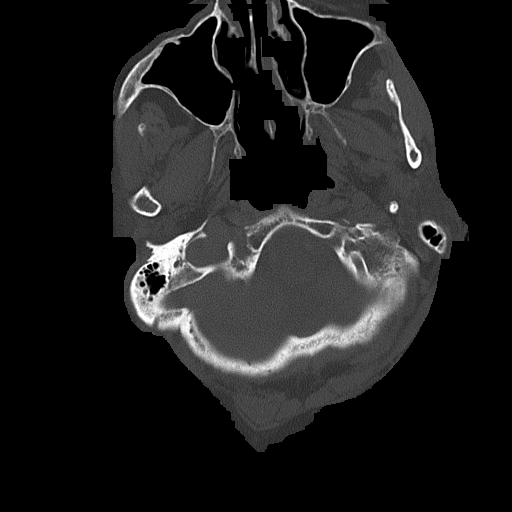
[im 17/83  bone]
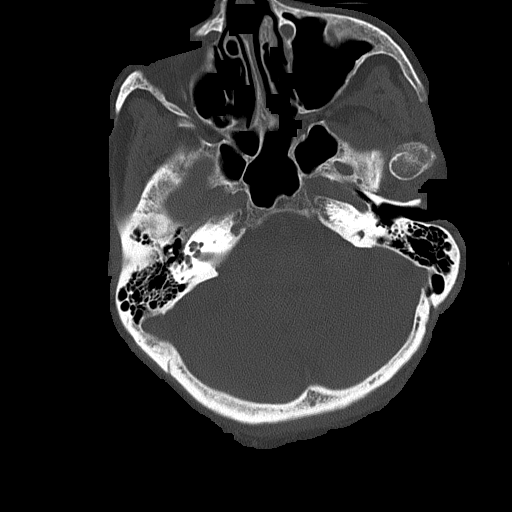
[im 25/83  bone]
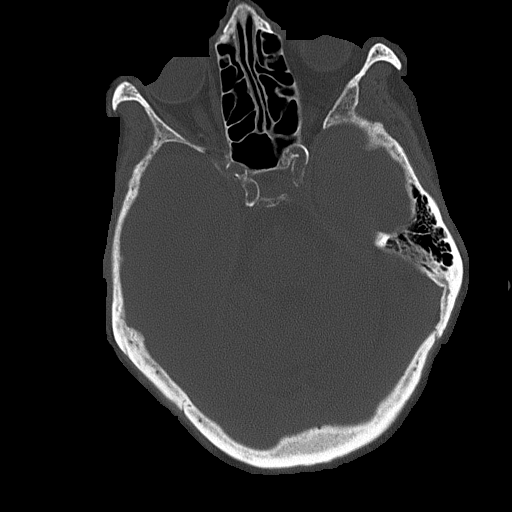

[Series 5: head without cor · coronal · non-contrast · 0.32mm/px · 3 of 67 slices shown]
[im 23/67  brain]
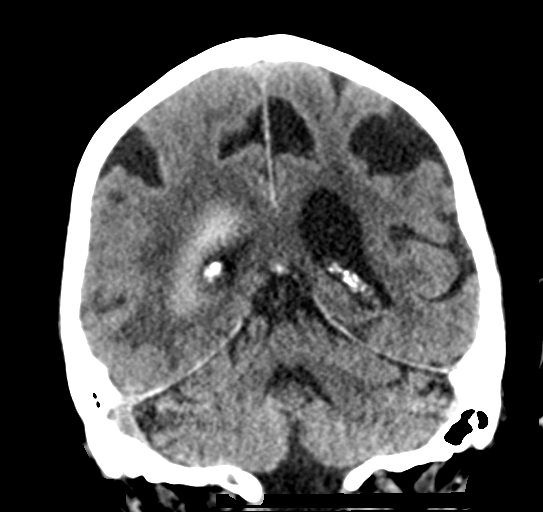
[im 30/67  brain]
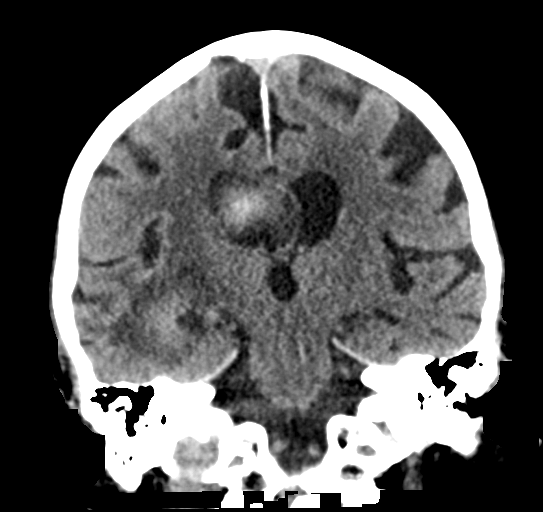
[im 37/67  brain]
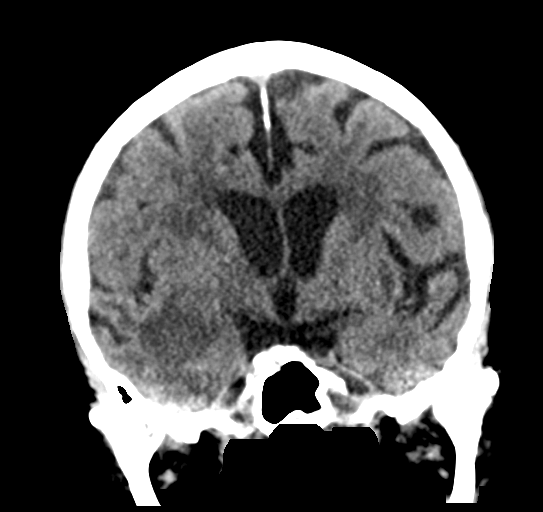

[Series 6: head without sag · sagittal · non-contrast · 0.32mm/px · 3 of 64 slices shown]
[im 22/64  brain]
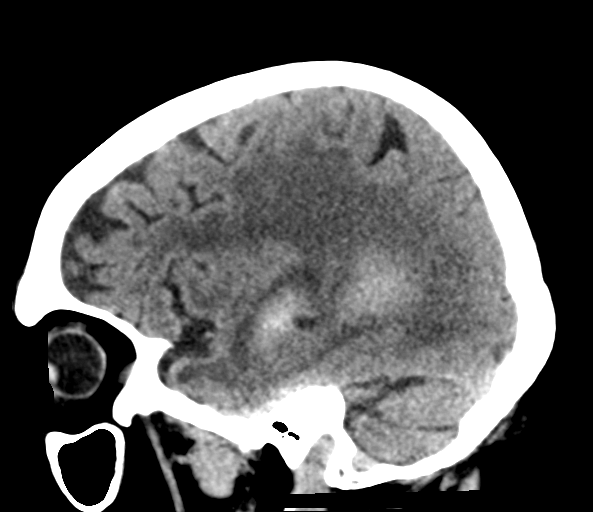
[im 32/64  brain]
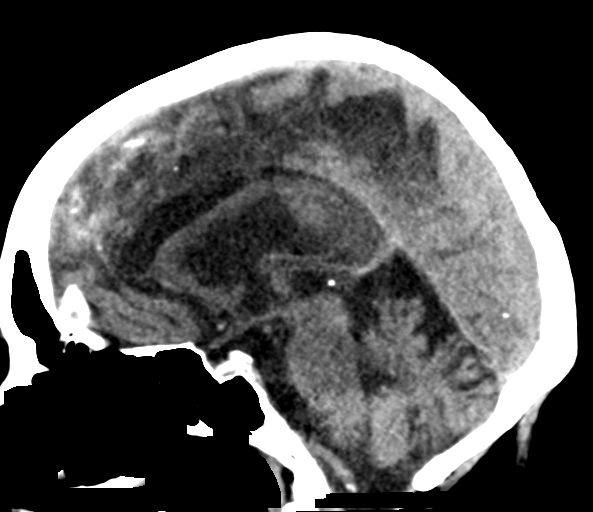
[im 43/64  brain]
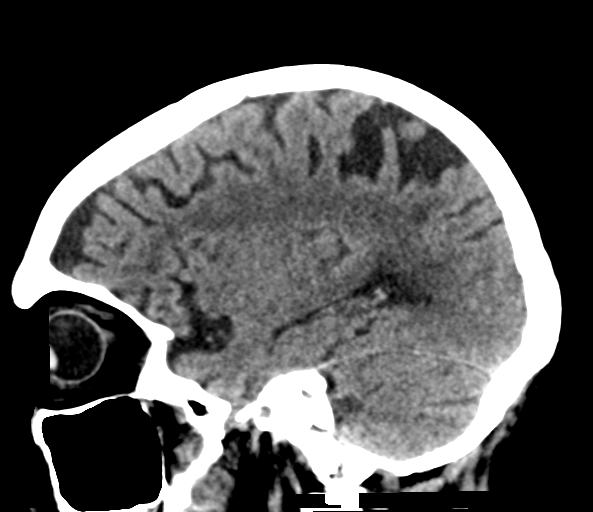

[16 of 47 positions shown; findings below may reference images not displayed]

FINDINGS: Brain: Hemorrhage tracking within and along the right lateral
ventricle is again seen and demonstrates expected evolutionary
change with decrease in attenuation seen. Right to left midline
shift is slightly decreased at 0.3 cm compared to 0.4 cm on the most
recent exam. No new hemorrhage is identified. Small volume of
hemorrhage layering dependently in the left lateral ventricle seen
on the most recent examination is difficult to visualize today. The
brain is atrophic with extensive chronic microvascular ischemic
change. No new hemorrhage or other new abnormality is identified.

Vascular: Atherosclerosis noted.

Skull: Intact.

Sinuses/Orbits: Negative.

Other: None.
IMPRESSION: No new abnormality since the most recent MRI. Expected evolutionary
change of the patient's right temporal lobe and right lateral
ventricle hemorrhage is again seen. Right to left midline shift is
slightly decreased since the most recent exam.

## 2020-09-03 DIAGNOSIS — K219 Gastro-esophageal reflux disease without esophagitis: Secondary | ICD-10-CM | POA: Diagnosis not present

## 2020-09-03 DIAGNOSIS — S72001S Fracture of unspecified part of neck of right femur, sequela: Secondary | ICD-10-CM | POA: Diagnosis not present

## 2020-09-03 DIAGNOSIS — I4891 Unspecified atrial fibrillation: Secondary | ICD-10-CM | POA: Diagnosis not present

## 2020-09-03 DIAGNOSIS — I2510 Atherosclerotic heart disease of native coronary artery without angina pectoris: Secondary | ICD-10-CM | POA: Diagnosis not present

## 2020-09-03 DIAGNOSIS — M6281 Muscle weakness (generalized): Secondary | ICD-10-CM | POA: Diagnosis not present

## 2020-09-03 DIAGNOSIS — F0390 Unspecified dementia without behavioral disturbance: Secondary | ICD-10-CM | POA: Diagnosis not present

## 2020-09-03 DIAGNOSIS — F419 Anxiety disorder, unspecified: Secondary | ICD-10-CM | POA: Diagnosis not present

## 2020-09-03 DIAGNOSIS — R2681 Unsteadiness on feet: Secondary | ICD-10-CM | POA: Diagnosis not present

## 2020-09-03 DIAGNOSIS — I10 Essential (primary) hypertension: Secondary | ICD-10-CM | POA: Diagnosis not present

## 2020-09-03 DIAGNOSIS — D649 Anemia, unspecified: Secondary | ICD-10-CM | POA: Diagnosis not present

## 2020-09-03 DIAGNOSIS — E782 Mixed hyperlipidemia: Secondary | ICD-10-CM | POA: Diagnosis not present

## 2020-09-03 DIAGNOSIS — I69354 Hemiplegia and hemiparesis following cerebral infarction affecting left non-dominant side: Secondary | ICD-10-CM | POA: Diagnosis not present

## 2020-09-05 ENCOUNTER — Other Ambulatory Visit: Payer: Self-pay | Admitting: *Deleted

## 2020-09-05 NOTE — Patient Outreach (Signed)
[facility] Post- Acute Care Coordinator follow up. Mrs. [name] resides in [facility].   Update received from [facility] SW indicating member's transition plan may be for long term care.   Will continue to follow for potential [facility] Care Management needs if member should return home instead.   Marthenia Rolling, MSN, RN,BSN [place] Acute Care Coordinator [phone] [facility]) [phone]  (Toll free office)

## 2020-09-06 DIAGNOSIS — F0390 Unspecified dementia without behavioral disturbance: Secondary | ICD-10-CM | POA: Diagnosis not present

## 2020-09-06 DIAGNOSIS — D649 Anemia, unspecified: Secondary | ICD-10-CM | POA: Diagnosis not present

## 2020-09-06 DIAGNOSIS — I4891 Unspecified atrial fibrillation: Secondary | ICD-10-CM | POA: Diagnosis not present

## 2020-09-06 DIAGNOSIS — M6281 Muscle weakness (generalized): Secondary | ICD-10-CM | POA: Diagnosis not present

## 2020-09-06 DIAGNOSIS — I69354 Hemiplegia and hemiparesis following cerebral infarction affecting left non-dominant side: Secondary | ICD-10-CM | POA: Diagnosis not present

## 2020-09-06 DIAGNOSIS — E782 Mixed hyperlipidemia: Secondary | ICD-10-CM | POA: Diagnosis not present

## 2020-09-06 DIAGNOSIS — F419 Anxiety disorder, unspecified: Secondary | ICD-10-CM | POA: Diagnosis not present

## 2020-09-06 DIAGNOSIS — S72001S Fracture of unspecified part of neck of right femur, sequela: Secondary | ICD-10-CM | POA: Diagnosis not present

## 2020-09-06 DIAGNOSIS — R2681 Unsteadiness on feet: Secondary | ICD-10-CM | POA: Diagnosis not present

## 2020-09-06 DIAGNOSIS — K219 Gastro-esophageal reflux disease without esophagitis: Secondary | ICD-10-CM | POA: Diagnosis not present

## 2020-09-06 DIAGNOSIS — I10 Essential (primary) hypertension: Secondary | ICD-10-CM | POA: Diagnosis not present

## 2020-09-06 DIAGNOSIS — I2510 Atherosclerotic heart disease of native coronary artery without angina pectoris: Secondary | ICD-10-CM | POA: Diagnosis not present

## 2020-09-07 DIAGNOSIS — D649 Anemia, unspecified: Secondary | ICD-10-CM | POA: Diagnosis not present

## 2020-09-07 DIAGNOSIS — D696 Thrombocytopenia, unspecified: Secondary | ICD-10-CM | POA: Diagnosis not present

## 2020-09-07 DIAGNOSIS — S81811A Laceration without foreign body, right lower leg, initial encounter: Secondary | ICD-10-CM | POA: Diagnosis not present

## 2020-09-07 DIAGNOSIS — K5901 Slow transit constipation: Secondary | ICD-10-CM | POA: Diagnosis not present

## 2020-09-07 DIAGNOSIS — M24549 Contracture, unspecified hand: Secondary | ICD-10-CM | POA: Diagnosis not present

## 2020-09-10 DIAGNOSIS — I69354 Hemiplegia and hemiparesis following cerebral infarction affecting left non-dominant side: Secondary | ICD-10-CM | POA: Diagnosis not present

## 2020-09-10 DIAGNOSIS — K219 Gastro-esophageal reflux disease without esophagitis: Secondary | ICD-10-CM | POA: Diagnosis not present

## 2020-09-10 DIAGNOSIS — I10 Essential (primary) hypertension: Secondary | ICD-10-CM | POA: Diagnosis not present

## 2020-09-10 DIAGNOSIS — R2681 Unsteadiness on feet: Secondary | ICD-10-CM | POA: Diagnosis not present

## 2020-09-10 DIAGNOSIS — F419 Anxiety disorder, unspecified: Secondary | ICD-10-CM | POA: Diagnosis not present

## 2020-09-10 DIAGNOSIS — I2510 Atherosclerotic heart disease of native coronary artery without angina pectoris: Secondary | ICD-10-CM | POA: Diagnosis not present

## 2020-09-10 DIAGNOSIS — M6281 Muscle weakness (generalized): Secondary | ICD-10-CM | POA: Diagnosis not present

## 2020-09-10 DIAGNOSIS — I4891 Unspecified atrial fibrillation: Secondary | ICD-10-CM | POA: Diagnosis not present

## 2020-09-10 DIAGNOSIS — E782 Mixed hyperlipidemia: Secondary | ICD-10-CM | POA: Diagnosis not present

## 2020-09-10 DIAGNOSIS — F0390 Unspecified dementia without behavioral disturbance: Secondary | ICD-10-CM | POA: Diagnosis not present

## 2020-09-10 DIAGNOSIS — D649 Anemia, unspecified: Secondary | ICD-10-CM | POA: Diagnosis not present

## 2020-09-10 DIAGNOSIS — S72001S Fracture of unspecified part of neck of right femur, sequela: Secondary | ICD-10-CM | POA: Diagnosis not present

## 2020-09-13 DIAGNOSIS — K219 Gastro-esophageal reflux disease without esophagitis: Secondary | ICD-10-CM | POA: Diagnosis not present

## 2020-09-13 DIAGNOSIS — S72001S Fracture of unspecified part of neck of right femur, sequela: Secondary | ICD-10-CM | POA: Diagnosis not present

## 2020-09-13 DIAGNOSIS — I2510 Atherosclerotic heart disease of native coronary artery without angina pectoris: Secondary | ICD-10-CM | POA: Diagnosis not present

## 2020-09-13 DIAGNOSIS — R2681 Unsteadiness on feet: Secondary | ICD-10-CM | POA: Diagnosis not present

## 2020-09-13 DIAGNOSIS — F0390 Unspecified dementia without behavioral disturbance: Secondary | ICD-10-CM | POA: Diagnosis not present

## 2020-09-13 DIAGNOSIS — I69354 Hemiplegia and hemiparesis following cerebral infarction affecting left non-dominant side: Secondary | ICD-10-CM | POA: Diagnosis not present

## 2020-09-13 DIAGNOSIS — E782 Mixed hyperlipidemia: Secondary | ICD-10-CM | POA: Diagnosis not present

## 2020-09-13 DIAGNOSIS — M6281 Muscle weakness (generalized): Secondary | ICD-10-CM | POA: Diagnosis not present

## 2020-09-13 DIAGNOSIS — D649 Anemia, unspecified: Secondary | ICD-10-CM | POA: Diagnosis not present

## 2020-09-13 DIAGNOSIS — F419 Anxiety disorder, unspecified: Secondary | ICD-10-CM | POA: Diagnosis not present

## 2020-09-13 DIAGNOSIS — I4891 Unspecified atrial fibrillation: Secondary | ICD-10-CM | POA: Diagnosis not present

## 2020-09-13 DIAGNOSIS — I10 Essential (primary) hypertension: Secondary | ICD-10-CM | POA: Diagnosis not present

## 2020-09-20 DIAGNOSIS — I10 Essential (primary) hypertension: Secondary | ICD-10-CM | POA: Diagnosis not present

## 2020-09-20 DIAGNOSIS — I2510 Atherosclerotic heart disease of native coronary artery without angina pectoris: Secondary | ICD-10-CM | POA: Diagnosis not present

## 2020-09-20 DIAGNOSIS — Z8679 Personal history of other diseases of the circulatory system: Secondary | ICD-10-CM | POA: Diagnosis not present

## 2020-09-20 DIAGNOSIS — R262 Difficulty in walking, not elsewhere classified: Secondary | ICD-10-CM | POA: Diagnosis not present

## 2020-09-25 ENCOUNTER — Other Ambulatory Visit: Payer: Self-pay | Admitting: *Deleted

## 2020-09-25 NOTE — Patient Outreach (Signed)
[facility] Post- Acute Care Coordinator follow up. Verified in Patient [place] that member has transitioned to long term care at [facility].   No identifiable [facility] Care Management needs at this time.    Marthenia Rolling, MSN, RN,BSN [place] Acute Care Coordinator [phone] [facility]) [phone]  (Toll free office)

## 2020-10-03 DIAGNOSIS — L720 Epidermal cyst: Secondary | ICD-10-CM | POA: Diagnosis not present

## 2020-10-03 DIAGNOSIS — L089 Local infection of the skin and subcutaneous tissue, unspecified: Secondary | ICD-10-CM | POA: Diagnosis not present

## 2020-10-08 ENCOUNTER — Ambulatory Visit: Payer: Medicare Other | Admitting: Neurology

## 2020-10-09 DIAGNOSIS — L03312 Cellulitis of back [any part except buttock]: Secondary | ICD-10-CM | POA: Diagnosis not present

## 2020-10-11 DIAGNOSIS — F0390 Unspecified dementia without behavioral disturbance: Secondary | ICD-10-CM | POA: Diagnosis not present

## 2020-10-11 DIAGNOSIS — I4891 Unspecified atrial fibrillation: Secondary | ICD-10-CM | POA: Diagnosis not present

## 2020-10-11 DIAGNOSIS — K219 Gastro-esophageal reflux disease without esophagitis: Secondary | ICD-10-CM | POA: Diagnosis not present

## 2020-10-11 DIAGNOSIS — K5901 Slow transit constipation: Secondary | ICD-10-CM | POA: Diagnosis not present

## 2020-10-16 DIAGNOSIS — L03312 Cellulitis of back [any part except buttock]: Secondary | ICD-10-CM | POA: Diagnosis not present

## 2020-10-29 DIAGNOSIS — M24542 Contracture, left hand: Secondary | ICD-10-CM | POA: Diagnosis not present

## 2020-10-29 DIAGNOSIS — M6281 Muscle weakness (generalized): Secondary | ICD-10-CM | POA: Diagnosis not present

## 2020-10-29 DIAGNOSIS — R278 Other lack of coordination: Secondary | ICD-10-CM | POA: Diagnosis not present

## 2020-10-29 DIAGNOSIS — R2681 Unsteadiness on feet: Secondary | ICD-10-CM | POA: Diagnosis not present

## 2020-10-29 DIAGNOSIS — R41841 Cognitive communication deficit: Secondary | ICD-10-CM | POA: Diagnosis not present

## 2020-10-29 DIAGNOSIS — R2689 Other abnormalities of gait and mobility: Secondary | ICD-10-CM | POA: Diagnosis not present

## 2020-10-29 DIAGNOSIS — S72001S Fracture of unspecified part of neck of right femur, sequela: Secondary | ICD-10-CM | POA: Diagnosis not present

## 2020-10-29 DIAGNOSIS — F0390 Unspecified dementia without behavioral disturbance: Secondary | ICD-10-CM | POA: Diagnosis not present

## 2020-10-29 DIAGNOSIS — I69354 Hemiplegia and hemiparesis following cerebral infarction affecting left non-dominant side: Secondary | ICD-10-CM | POA: Diagnosis not present

## 2020-10-29 DIAGNOSIS — R1312 Dysphagia, oropharyngeal phase: Secondary | ICD-10-CM | POA: Diagnosis not present

## 2020-10-30 DIAGNOSIS — M6281 Muscle weakness (generalized): Secondary | ICD-10-CM | POA: Diagnosis not present

## 2020-10-30 DIAGNOSIS — R2681 Unsteadiness on feet: Secondary | ICD-10-CM | POA: Diagnosis not present

## 2020-10-30 DIAGNOSIS — M24542 Contracture, left hand: Secondary | ICD-10-CM | POA: Diagnosis not present

## 2020-10-30 DIAGNOSIS — L03312 Cellulitis of back [any part except buttock]: Secondary | ICD-10-CM | POA: Diagnosis not present

## 2020-10-30 DIAGNOSIS — I69354 Hemiplegia and hemiparesis following cerebral infarction affecting left non-dominant side: Secondary | ICD-10-CM | POA: Diagnosis not present

## 2020-10-30 DIAGNOSIS — F0390 Unspecified dementia without behavioral disturbance: Secondary | ICD-10-CM | POA: Diagnosis not present

## 2020-10-30 DIAGNOSIS — S72001S Fracture of unspecified part of neck of right femur, sequela: Secondary | ICD-10-CM | POA: Diagnosis not present

## 2020-10-31 DIAGNOSIS — I69354 Hemiplegia and hemiparesis following cerebral infarction affecting left non-dominant side: Secondary | ICD-10-CM | POA: Diagnosis not present

## 2020-10-31 DIAGNOSIS — M6281 Muscle weakness (generalized): Secondary | ICD-10-CM | POA: Diagnosis not present

## 2020-10-31 DIAGNOSIS — M24542 Contracture, left hand: Secondary | ICD-10-CM | POA: Diagnosis not present

## 2020-10-31 DIAGNOSIS — F0390 Unspecified dementia without behavioral disturbance: Secondary | ICD-10-CM | POA: Diagnosis not present

## 2020-10-31 DIAGNOSIS — S72001S Fracture of unspecified part of neck of right femur, sequela: Secondary | ICD-10-CM | POA: Diagnosis not present

## 2020-10-31 DIAGNOSIS — R2681 Unsteadiness on feet: Secondary | ICD-10-CM | POA: Diagnosis not present

## 2020-11-05 DIAGNOSIS — S72001S Fracture of unspecified part of neck of right femur, sequela: Secondary | ICD-10-CM | POA: Diagnosis not present

## 2020-11-05 DIAGNOSIS — R2681 Unsteadiness on feet: Secondary | ICD-10-CM | POA: Diagnosis not present

## 2020-11-05 DIAGNOSIS — I69354 Hemiplegia and hemiparesis following cerebral infarction affecting left non-dominant side: Secondary | ICD-10-CM | POA: Diagnosis not present

## 2020-11-05 DIAGNOSIS — M6281 Muscle weakness (generalized): Secondary | ICD-10-CM | POA: Diagnosis not present

## 2020-11-05 DIAGNOSIS — M24542 Contracture, left hand: Secondary | ICD-10-CM | POA: Diagnosis not present

## 2020-11-05 DIAGNOSIS — F0390 Unspecified dementia without behavioral disturbance: Secondary | ICD-10-CM | POA: Diagnosis not present

## 2020-11-06 DIAGNOSIS — F0390 Unspecified dementia without behavioral disturbance: Secondary | ICD-10-CM | POA: Diagnosis not present

## 2020-11-06 DIAGNOSIS — R2681 Unsteadiness on feet: Secondary | ICD-10-CM | POA: Diagnosis not present

## 2020-11-06 DIAGNOSIS — M6281 Muscle weakness (generalized): Secondary | ICD-10-CM | POA: Diagnosis not present

## 2020-11-06 DIAGNOSIS — L03312 Cellulitis of back [any part except buttock]: Secondary | ICD-10-CM | POA: Diagnosis not present

## 2020-11-06 DIAGNOSIS — I69354 Hemiplegia and hemiparesis following cerebral infarction affecting left non-dominant side: Secondary | ICD-10-CM | POA: Diagnosis not present

## 2020-11-06 DIAGNOSIS — M24542 Contracture, left hand: Secondary | ICD-10-CM | POA: Diagnosis not present

## 2020-11-06 DIAGNOSIS — S72001S Fracture of unspecified part of neck of right femur, sequela: Secondary | ICD-10-CM | POA: Diagnosis not present

## 2020-11-07 DIAGNOSIS — M24542 Contracture, left hand: Secondary | ICD-10-CM | POA: Diagnosis not present

## 2020-11-07 DIAGNOSIS — M6281 Muscle weakness (generalized): Secondary | ICD-10-CM | POA: Diagnosis not present

## 2020-11-07 DIAGNOSIS — I69354 Hemiplegia and hemiparesis following cerebral infarction affecting left non-dominant side: Secondary | ICD-10-CM | POA: Diagnosis not present

## 2020-11-07 DIAGNOSIS — F0390 Unspecified dementia without behavioral disturbance: Secondary | ICD-10-CM | POA: Diagnosis not present

## 2020-11-07 DIAGNOSIS — S72001S Fracture of unspecified part of neck of right femur, sequela: Secondary | ICD-10-CM | POA: Diagnosis not present

## 2020-11-07 DIAGNOSIS — R2681 Unsteadiness on feet: Secondary | ICD-10-CM | POA: Diagnosis not present

## 2020-11-09 DIAGNOSIS — F0390 Unspecified dementia without behavioral disturbance: Secondary | ICD-10-CM | POA: Diagnosis not present

## 2020-11-09 DIAGNOSIS — K920 Hematemesis: Secondary | ICD-10-CM | POA: Diagnosis not present

## 2020-11-09 DIAGNOSIS — R0989 Other specified symptoms and signs involving the circulatory and respiratory systems: Secondary | ICD-10-CM | POA: Diagnosis not present

## 2020-11-09 DIAGNOSIS — D62 Acute posthemorrhagic anemia: Secondary | ICD-10-CM | POA: Diagnosis not present

## 2020-11-09 DIAGNOSIS — Z7189 Other specified counseling: Secondary | ICD-10-CM | POA: Diagnosis not present

## 2020-11-10 DIAGNOSIS — I2510 Atherosclerotic heart disease of native coronary artery without angina pectoris: Secondary | ICD-10-CM | POA: Diagnosis not present

## 2020-11-10 DIAGNOSIS — D62 Acute posthemorrhagic anemia: Secondary | ICD-10-CM | POA: Diagnosis not present

## 2020-11-12 DIAGNOSIS — I2510 Atherosclerotic heart disease of native coronary artery without angina pectoris: Secondary | ICD-10-CM | POA: Diagnosis not present

## 2020-11-12 DIAGNOSIS — D62 Acute posthemorrhagic anemia: Secondary | ICD-10-CM | POA: Diagnosis not present

## 2020-11-12 DIAGNOSIS — M6281 Muscle weakness (generalized): Secondary | ICD-10-CM | POA: Diagnosis not present

## 2020-11-12 DIAGNOSIS — M24542 Contracture, left hand: Secondary | ICD-10-CM | POA: Diagnosis not present

## 2020-11-12 DIAGNOSIS — S72001S Fracture of unspecified part of neck of right femur, sequela: Secondary | ICD-10-CM | POA: Diagnosis not present

## 2020-11-12 DIAGNOSIS — I480 Paroxysmal atrial fibrillation: Secondary | ICD-10-CM | POA: Diagnosis not present

## 2020-11-12 DIAGNOSIS — I69354 Hemiplegia and hemiparesis following cerebral infarction affecting left non-dominant side: Secondary | ICD-10-CM | POA: Diagnosis not present

## 2020-11-12 DIAGNOSIS — D649 Anemia, unspecified: Secondary | ICD-10-CM | POA: Diagnosis not present

## 2020-11-12 DIAGNOSIS — K920 Hematemesis: Secondary | ICD-10-CM | POA: Diagnosis not present

## 2020-11-12 DIAGNOSIS — I4891 Unspecified atrial fibrillation: Secondary | ICD-10-CM | POA: Diagnosis not present

## 2020-11-12 DIAGNOSIS — R2681 Unsteadiness on feet: Secondary | ICD-10-CM | POA: Diagnosis not present

## 2020-11-12 DIAGNOSIS — K2100 Gastro-esophageal reflux disease with esophagitis, without bleeding: Secondary | ICD-10-CM | POA: Diagnosis not present

## 2020-11-12 DIAGNOSIS — D72829 Elevated white blood cell count, unspecified: Secondary | ICD-10-CM | POA: Diagnosis not present

## 2020-11-12 DIAGNOSIS — F0390 Unspecified dementia without behavioral disturbance: Secondary | ICD-10-CM | POA: Diagnosis not present

## 2020-11-13 DIAGNOSIS — L03312 Cellulitis of back [any part except buttock]: Secondary | ICD-10-CM | POA: Diagnosis not present

## 2020-11-14 DIAGNOSIS — R2681 Unsteadiness on feet: Secondary | ICD-10-CM | POA: Diagnosis not present

## 2020-11-14 DIAGNOSIS — M6281 Muscle weakness (generalized): Secondary | ICD-10-CM | POA: Diagnosis not present

## 2020-11-14 DIAGNOSIS — I69354 Hemiplegia and hemiparesis following cerebral infarction affecting left non-dominant side: Secondary | ICD-10-CM | POA: Diagnosis not present

## 2020-11-14 DIAGNOSIS — S72001S Fracture of unspecified part of neck of right femur, sequela: Secondary | ICD-10-CM | POA: Diagnosis not present

## 2020-11-14 DIAGNOSIS — F0390 Unspecified dementia without behavioral disturbance: Secondary | ICD-10-CM | POA: Diagnosis not present

## 2020-11-14 DIAGNOSIS — M24542 Contracture, left hand: Secondary | ICD-10-CM | POA: Diagnosis not present

## 2020-11-15 DIAGNOSIS — I69354 Hemiplegia and hemiparesis following cerebral infarction affecting left non-dominant side: Secondary | ICD-10-CM | POA: Diagnosis not present

## 2020-11-15 DIAGNOSIS — M6281 Muscle weakness (generalized): Secondary | ICD-10-CM | POA: Diagnosis not present

## 2020-11-15 DIAGNOSIS — R2681 Unsteadiness on feet: Secondary | ICD-10-CM | POA: Diagnosis not present

## 2020-11-15 DIAGNOSIS — M24542 Contracture, left hand: Secondary | ICD-10-CM | POA: Diagnosis not present

## 2020-11-15 DIAGNOSIS — F0390 Unspecified dementia without behavioral disturbance: Secondary | ICD-10-CM | POA: Diagnosis not present

## 2020-11-15 DIAGNOSIS — S72001S Fracture of unspecified part of neck of right femur, sequela: Secondary | ICD-10-CM | POA: Diagnosis not present

## 2020-11-19 DIAGNOSIS — D649 Anemia, unspecified: Secondary | ICD-10-CM | POA: Diagnosis not present

## 2020-11-20 DIAGNOSIS — R278 Other lack of coordination: Secondary | ICD-10-CM | POA: Diagnosis not present

## 2020-11-20 DIAGNOSIS — L03312 Cellulitis of back [any part except buttock]: Secondary | ICD-10-CM | POA: Diagnosis not present

## 2020-11-20 DIAGNOSIS — I69354 Hemiplegia and hemiparesis following cerebral infarction affecting left non-dominant side: Secondary | ICD-10-CM | POA: Diagnosis not present

## 2020-11-20 DIAGNOSIS — F0390 Unspecified dementia without behavioral disturbance: Secondary | ICD-10-CM | POA: Diagnosis not present

## 2020-11-20 DIAGNOSIS — R1312 Dysphagia, oropharyngeal phase: Secondary | ICD-10-CM | POA: Diagnosis not present

## 2020-11-20 DIAGNOSIS — S72001S Fracture of unspecified part of neck of right femur, sequela: Secondary | ICD-10-CM | POA: Diagnosis not present

## 2020-11-20 DIAGNOSIS — M24542 Contracture, left hand: Secondary | ICD-10-CM | POA: Diagnosis not present

## 2020-11-20 DIAGNOSIS — M6281 Muscle weakness (generalized): Secondary | ICD-10-CM | POA: Diagnosis not present

## 2020-11-20 DIAGNOSIS — R2689 Other abnormalities of gait and mobility: Secondary | ICD-10-CM | POA: Diagnosis not present

## 2020-11-20 DIAGNOSIS — R41841 Cognitive communication deficit: Secondary | ICD-10-CM | POA: Diagnosis not present

## 2020-11-20 DIAGNOSIS — R2681 Unsteadiness on feet: Secondary | ICD-10-CM | POA: Diagnosis not present

## 2020-11-21 DIAGNOSIS — K2100 Gastro-esophageal reflux disease with esophagitis, without bleeding: Secondary | ICD-10-CM | POA: Diagnosis not present

## 2020-11-21 DIAGNOSIS — F5101 Primary insomnia: Secondary | ICD-10-CM | POA: Diagnosis not present

## 2020-11-21 DIAGNOSIS — D696 Thrombocytopenia, unspecified: Secondary | ICD-10-CM | POA: Diagnosis not present

## 2020-11-21 DIAGNOSIS — F03918 Unspecified dementia, unspecified severity, with other behavioral disturbance: Secondary | ICD-10-CM | POA: Diagnosis not present

## 2020-11-21 DIAGNOSIS — L239 Allergic contact dermatitis, unspecified cause: Secondary | ICD-10-CM | POA: Diagnosis not present

## 2020-11-21 DIAGNOSIS — F0390 Unspecified dementia without behavioral disturbance: Secondary | ICD-10-CM | POA: Diagnosis not present

## 2020-11-21 DIAGNOSIS — R2681 Unsteadiness on feet: Secondary | ICD-10-CM | POA: Diagnosis not present

## 2020-11-21 DIAGNOSIS — M24542 Contracture, left hand: Secondary | ICD-10-CM | POA: Diagnosis not present

## 2020-11-21 DIAGNOSIS — F330 Major depressive disorder, recurrent, mild: Secondary | ICD-10-CM | POA: Diagnosis not present

## 2020-11-21 DIAGNOSIS — M6281 Muscle weakness (generalized): Secondary | ICD-10-CM | POA: Diagnosis not present

## 2020-11-21 DIAGNOSIS — S72001S Fracture of unspecified part of neck of right femur, sequela: Secondary | ICD-10-CM | POA: Diagnosis not present

## 2020-11-21 DIAGNOSIS — I69354 Hemiplegia and hemiparesis following cerebral infarction affecting left non-dominant side: Secondary | ICD-10-CM | POA: Diagnosis not present

## 2020-11-21 DIAGNOSIS — K920 Hematemesis: Secondary | ICD-10-CM | POA: Diagnosis not present

## 2020-11-21 DIAGNOSIS — F424 Excoriation (skin-picking) disorder: Secondary | ICD-10-CM | POA: Diagnosis not present

## 2020-11-22 DIAGNOSIS — I69354 Hemiplegia and hemiparesis following cerebral infarction affecting left non-dominant side: Secondary | ICD-10-CM | POA: Diagnosis not present

## 2020-11-22 DIAGNOSIS — R2681 Unsteadiness on feet: Secondary | ICD-10-CM | POA: Diagnosis not present

## 2020-11-22 DIAGNOSIS — S72001S Fracture of unspecified part of neck of right femur, sequela: Secondary | ICD-10-CM | POA: Diagnosis not present

## 2020-11-22 DIAGNOSIS — M24542 Contracture, left hand: Secondary | ICD-10-CM | POA: Diagnosis not present

## 2020-11-22 DIAGNOSIS — F0390 Unspecified dementia without behavioral disturbance: Secondary | ICD-10-CM | POA: Diagnosis not present

## 2020-11-22 DIAGNOSIS — M6281 Muscle weakness (generalized): Secondary | ICD-10-CM | POA: Diagnosis not present

## 2020-11-27 DIAGNOSIS — L03312 Cellulitis of back [any part except buttock]: Secondary | ICD-10-CM | POA: Diagnosis not present

## 2020-11-29 DIAGNOSIS — M24542 Contracture, left hand: Secondary | ICD-10-CM | POA: Diagnosis not present

## 2020-11-29 DIAGNOSIS — S72001S Fracture of unspecified part of neck of right femur, sequela: Secondary | ICD-10-CM | POA: Diagnosis not present

## 2020-11-29 DIAGNOSIS — F0390 Unspecified dementia without behavioral disturbance: Secondary | ICD-10-CM | POA: Diagnosis not present

## 2020-11-29 DIAGNOSIS — M6281 Muscle weakness (generalized): Secondary | ICD-10-CM | POA: Diagnosis not present

## 2020-11-29 DIAGNOSIS — I69354 Hemiplegia and hemiparesis following cerebral infarction affecting left non-dominant side: Secondary | ICD-10-CM | POA: Diagnosis not present

## 2020-11-29 DIAGNOSIS — R2681 Unsteadiness on feet: Secondary | ICD-10-CM | POA: Diagnosis not present

## 2020-11-30 DIAGNOSIS — F0390 Unspecified dementia without behavioral disturbance: Secondary | ICD-10-CM | POA: Diagnosis not present

## 2020-11-30 DIAGNOSIS — R2681 Unsteadiness on feet: Secondary | ICD-10-CM | POA: Diagnosis not present

## 2020-11-30 DIAGNOSIS — M24542 Contracture, left hand: Secondary | ICD-10-CM | POA: Diagnosis not present

## 2020-11-30 DIAGNOSIS — S72001S Fracture of unspecified part of neck of right femur, sequela: Secondary | ICD-10-CM | POA: Diagnosis not present

## 2020-11-30 DIAGNOSIS — M6281 Muscle weakness (generalized): Secondary | ICD-10-CM | POA: Diagnosis not present

## 2020-11-30 DIAGNOSIS — I69354 Hemiplegia and hemiparesis following cerebral infarction affecting left non-dominant side: Secondary | ICD-10-CM | POA: Diagnosis not present

## 2020-12-02 DIAGNOSIS — S72001S Fracture of unspecified part of neck of right femur, sequela: Secondary | ICD-10-CM | POA: Diagnosis not present

## 2020-12-02 DIAGNOSIS — M24542 Contracture, left hand: Secondary | ICD-10-CM | POA: Diagnosis not present

## 2020-12-02 DIAGNOSIS — M6281 Muscle weakness (generalized): Secondary | ICD-10-CM | POA: Diagnosis not present

## 2020-12-02 DIAGNOSIS — F0390 Unspecified dementia without behavioral disturbance: Secondary | ICD-10-CM | POA: Diagnosis not present

## 2020-12-02 DIAGNOSIS — I69354 Hemiplegia and hemiparesis following cerebral infarction affecting left non-dominant side: Secondary | ICD-10-CM | POA: Diagnosis not present

## 2020-12-02 DIAGNOSIS — R2681 Unsteadiness on feet: Secondary | ICD-10-CM | POA: Diagnosis not present

## 2020-12-03 DIAGNOSIS — I69354 Hemiplegia and hemiparesis following cerebral infarction affecting left non-dominant side: Secondary | ICD-10-CM | POA: Diagnosis not present

## 2020-12-03 DIAGNOSIS — M6281 Muscle weakness (generalized): Secondary | ICD-10-CM | POA: Diagnosis not present

## 2020-12-03 DIAGNOSIS — R2681 Unsteadiness on feet: Secondary | ICD-10-CM | POA: Diagnosis not present

## 2020-12-03 DIAGNOSIS — F0390 Unspecified dementia without behavioral disturbance: Secondary | ICD-10-CM | POA: Diagnosis not present

## 2020-12-03 DIAGNOSIS — S72001S Fracture of unspecified part of neck of right femur, sequela: Secondary | ICD-10-CM | POA: Diagnosis not present

## 2020-12-03 DIAGNOSIS — M24542 Contracture, left hand: Secondary | ICD-10-CM | POA: Diagnosis not present

## 2020-12-04 DIAGNOSIS — F0390 Unspecified dementia without behavioral disturbance: Secondary | ICD-10-CM | POA: Diagnosis not present

## 2020-12-04 DIAGNOSIS — M24542 Contracture, left hand: Secondary | ICD-10-CM | POA: Diagnosis not present

## 2020-12-04 DIAGNOSIS — I69354 Hemiplegia and hemiparesis following cerebral infarction affecting left non-dominant side: Secondary | ICD-10-CM | POA: Diagnosis not present

## 2020-12-04 DIAGNOSIS — R2681 Unsteadiness on feet: Secondary | ICD-10-CM | POA: Diagnosis not present

## 2020-12-04 DIAGNOSIS — M6281 Muscle weakness (generalized): Secondary | ICD-10-CM | POA: Diagnosis not present

## 2020-12-04 DIAGNOSIS — S72001S Fracture of unspecified part of neck of right femur, sequela: Secondary | ICD-10-CM | POA: Diagnosis not present

## 2020-12-05 DIAGNOSIS — M6281 Muscle weakness (generalized): Secondary | ICD-10-CM | POA: Diagnosis not present

## 2020-12-05 DIAGNOSIS — R2681 Unsteadiness on feet: Secondary | ICD-10-CM | POA: Diagnosis not present

## 2020-12-05 DIAGNOSIS — F0390 Unspecified dementia without behavioral disturbance: Secondary | ICD-10-CM | POA: Diagnosis not present

## 2020-12-05 DIAGNOSIS — M24542 Contracture, left hand: Secondary | ICD-10-CM | POA: Diagnosis not present

## 2020-12-05 DIAGNOSIS — G8190 Hemiplegia, unspecified affecting unspecified side: Secondary | ICD-10-CM | POA: Diagnosis not present

## 2020-12-05 DIAGNOSIS — I69354 Hemiplegia and hemiparesis following cerebral infarction affecting left non-dominant side: Secondary | ICD-10-CM | POA: Diagnosis not present

## 2020-12-05 DIAGNOSIS — S72001S Fracture of unspecified part of neck of right femur, sequela: Secondary | ICD-10-CM | POA: Diagnosis not present

## 2020-12-05 DIAGNOSIS — R0989 Other specified symptoms and signs involving the circulatory and respiratory systems: Secondary | ICD-10-CM | POA: Diagnosis not present

## 2020-12-11 DIAGNOSIS — S72001S Fracture of unspecified part of neck of right femur, sequela: Secondary | ICD-10-CM | POA: Diagnosis not present

## 2020-12-11 DIAGNOSIS — S41102A Unspecified open wound of left upper arm, initial encounter: Secondary | ICD-10-CM | POA: Diagnosis not present

## 2020-12-11 DIAGNOSIS — I69354 Hemiplegia and hemiparesis following cerebral infarction affecting left non-dominant side: Secondary | ICD-10-CM | POA: Diagnosis not present

## 2020-12-11 DIAGNOSIS — M6281 Muscle weakness (generalized): Secondary | ICD-10-CM | POA: Diagnosis not present

## 2020-12-11 DIAGNOSIS — M24542 Contracture, left hand: Secondary | ICD-10-CM | POA: Diagnosis not present

## 2020-12-11 DIAGNOSIS — L988 Other specified disorders of the skin and subcutaneous tissue: Secondary | ICD-10-CM | POA: Diagnosis not present

## 2020-12-11 DIAGNOSIS — R2681 Unsteadiness on feet: Secondary | ICD-10-CM | POA: Diagnosis not present

## 2020-12-11 DIAGNOSIS — F0390 Unspecified dementia without behavioral disturbance: Secondary | ICD-10-CM | POA: Diagnosis not present

## 2020-12-11 DIAGNOSIS — I10 Essential (primary) hypertension: Secondary | ICD-10-CM | POA: Diagnosis not present

## 2020-12-11 DIAGNOSIS — W19XXXA Unspecified fall, initial encounter: Secondary | ICD-10-CM | POA: Diagnosis not present

## 2020-12-12 DIAGNOSIS — S72001S Fracture of unspecified part of neck of right femur, sequela: Secondary | ICD-10-CM | POA: Diagnosis not present

## 2020-12-12 DIAGNOSIS — M545 Low back pain, unspecified: Secondary | ICD-10-CM | POA: Diagnosis not present

## 2020-12-12 DIAGNOSIS — M6281 Muscle weakness (generalized): Secondary | ICD-10-CM | POA: Diagnosis not present

## 2020-12-12 DIAGNOSIS — I69354 Hemiplegia and hemiparesis following cerebral infarction affecting left non-dominant side: Secondary | ICD-10-CM | POA: Diagnosis not present

## 2020-12-12 DIAGNOSIS — F0390 Unspecified dementia without behavioral disturbance: Secondary | ICD-10-CM | POA: Diagnosis not present

## 2020-12-12 DIAGNOSIS — R2681 Unsteadiness on feet: Secondary | ICD-10-CM | POA: Diagnosis not present

## 2020-12-12 DIAGNOSIS — M533 Sacrococcygeal disorders, not elsewhere classified: Secondary | ICD-10-CM | POA: Diagnosis not present

## 2020-12-12 DIAGNOSIS — M24542 Contracture, left hand: Secondary | ICD-10-CM | POA: Diagnosis not present

## 2020-12-13 DIAGNOSIS — S72001S Fracture of unspecified part of neck of right femur, sequela: Secondary | ICD-10-CM | POA: Diagnosis not present

## 2020-12-13 DIAGNOSIS — M24542 Contracture, left hand: Secondary | ICD-10-CM | POA: Diagnosis not present

## 2020-12-13 DIAGNOSIS — M6281 Muscle weakness (generalized): Secondary | ICD-10-CM | POA: Diagnosis not present

## 2020-12-13 DIAGNOSIS — Z23 Encounter for immunization: Secondary | ICD-10-CM | POA: Diagnosis not present

## 2020-12-13 DIAGNOSIS — I69354 Hemiplegia and hemiparesis following cerebral infarction affecting left non-dominant side: Secondary | ICD-10-CM | POA: Diagnosis not present

## 2020-12-13 DIAGNOSIS — R2681 Unsteadiness on feet: Secondary | ICD-10-CM | POA: Diagnosis not present

## 2020-12-13 DIAGNOSIS — F0390 Unspecified dementia without behavioral disturbance: Secondary | ICD-10-CM | POA: Diagnosis not present

## 2020-12-14 DIAGNOSIS — F39 Unspecified mood [affective] disorder: Secondary | ICD-10-CM | POA: Diagnosis not present

## 2020-12-14 DIAGNOSIS — D649 Anemia, unspecified: Secondary | ICD-10-CM | POA: Diagnosis not present

## 2020-12-14 DIAGNOSIS — F0390 Unspecified dementia without behavioral disturbance: Secondary | ICD-10-CM | POA: Diagnosis not present

## 2020-12-14 DIAGNOSIS — Z8679 Personal history of other diseases of the circulatory system: Secondary | ICD-10-CM | POA: Diagnosis not present

## 2020-12-14 DIAGNOSIS — L989 Disorder of the skin and subcutaneous tissue, unspecified: Secondary | ICD-10-CM | POA: Diagnosis not present

## 2020-12-14 DIAGNOSIS — I2510 Atherosclerotic heart disease of native coronary artery without angina pectoris: Secondary | ICD-10-CM | POA: Diagnosis not present

## 2020-12-14 DIAGNOSIS — I10 Essential (primary) hypertension: Secondary | ICD-10-CM | POA: Diagnosis not present

## 2020-12-14 DIAGNOSIS — I4891 Unspecified atrial fibrillation: Secondary | ICD-10-CM | POA: Diagnosis not present

## 2020-12-14 DIAGNOSIS — R262 Difficulty in walking, not elsewhere classified: Secondary | ICD-10-CM | POA: Diagnosis not present

## 2020-12-14 DIAGNOSIS — R54 Age-related physical debility: Secondary | ICD-10-CM | POA: Diagnosis not present

## 2020-12-14 DIAGNOSIS — K2100 Gastro-esophageal reflux disease with esophagitis, without bleeding: Secondary | ICD-10-CM | POA: Diagnosis not present

## 2020-12-17 DIAGNOSIS — F424 Excoriation (skin-picking) disorder: Secondary | ICD-10-CM | POA: Diagnosis not present

## 2020-12-17 DIAGNOSIS — F330 Major depressive disorder, recurrent, mild: Secondary | ICD-10-CM | POA: Diagnosis not present

## 2020-12-17 DIAGNOSIS — F03918 Unspecified dementia, unspecified severity, with other behavioral disturbance: Secondary | ICD-10-CM | POA: Diagnosis not present

## 2020-12-17 DIAGNOSIS — F5101 Primary insomnia: Secondary | ICD-10-CM | POA: Diagnosis not present

## 2020-12-18 DIAGNOSIS — L988 Other specified disorders of the skin and subcutaneous tissue: Secondary | ICD-10-CM | POA: Diagnosis not present

## 2020-12-19 DIAGNOSIS — R278 Other lack of coordination: Secondary | ICD-10-CM | POA: Diagnosis not present

## 2020-12-19 DIAGNOSIS — R2689 Other abnormalities of gait and mobility: Secondary | ICD-10-CM | POA: Diagnosis not present

## 2020-12-19 DIAGNOSIS — R2681 Unsteadiness on feet: Secondary | ICD-10-CM | POA: Diagnosis not present

## 2020-12-19 DIAGNOSIS — R41841 Cognitive communication deficit: Secondary | ICD-10-CM | POA: Diagnosis not present

## 2020-12-19 DIAGNOSIS — I69354 Hemiplegia and hemiparesis following cerebral infarction affecting left non-dominant side: Secondary | ICD-10-CM | POA: Diagnosis not present

## 2020-12-19 DIAGNOSIS — R1312 Dysphagia, oropharyngeal phase: Secondary | ICD-10-CM | POA: Diagnosis not present

## 2020-12-19 DIAGNOSIS — F0390 Unspecified dementia without behavioral disturbance: Secondary | ICD-10-CM | POA: Diagnosis not present

## 2020-12-19 DIAGNOSIS — I10 Essential (primary) hypertension: Secondary | ICD-10-CM | POA: Diagnosis not present

## 2020-12-19 DIAGNOSIS — M6281 Muscle weakness (generalized): Secondary | ICD-10-CM | POA: Diagnosis not present

## 2020-12-19 DIAGNOSIS — D510 Vitamin B12 deficiency anemia due to intrinsic factor deficiency: Secondary | ICD-10-CM | POA: Diagnosis not present

## 2020-12-19 DIAGNOSIS — S72001S Fracture of unspecified part of neck of right femur, sequela: Secondary | ICD-10-CM | POA: Diagnosis not present

## 2020-12-19 DIAGNOSIS — M24542 Contracture, left hand: Secondary | ICD-10-CM | POA: Diagnosis not present

## 2020-12-20 DIAGNOSIS — M6281 Muscle weakness (generalized): Secondary | ICD-10-CM | POA: Diagnosis not present

## 2020-12-20 DIAGNOSIS — I69354 Hemiplegia and hemiparesis following cerebral infarction affecting left non-dominant side: Secondary | ICD-10-CM | POA: Diagnosis not present

## 2020-12-20 DIAGNOSIS — S72001S Fracture of unspecified part of neck of right femur, sequela: Secondary | ICD-10-CM | POA: Diagnosis not present

## 2020-12-20 DIAGNOSIS — E860 Dehydration: Secondary | ICD-10-CM | POA: Diagnosis not present

## 2020-12-20 DIAGNOSIS — M24542 Contracture, left hand: Secondary | ICD-10-CM | POA: Diagnosis not present

## 2020-12-20 DIAGNOSIS — F0390 Unspecified dementia without behavioral disturbance: Secondary | ICD-10-CM | POA: Diagnosis not present

## 2020-12-20 DIAGNOSIS — R2681 Unsteadiness on feet: Secondary | ICD-10-CM | POA: Diagnosis not present

## 2020-12-20 DIAGNOSIS — E870 Hyperosmolality and hypernatremia: Secondary | ICD-10-CM | POA: Diagnosis not present

## 2020-12-20 DIAGNOSIS — I10 Essential (primary) hypertension: Secondary | ICD-10-CM | POA: Diagnosis not present

## 2020-12-21 DIAGNOSIS — S72001S Fracture of unspecified part of neck of right femur, sequela: Secondary | ICD-10-CM | POA: Diagnosis not present

## 2020-12-21 DIAGNOSIS — M24542 Contracture, left hand: Secondary | ICD-10-CM | POA: Diagnosis not present

## 2020-12-21 DIAGNOSIS — F0390 Unspecified dementia without behavioral disturbance: Secondary | ICD-10-CM | POA: Diagnosis not present

## 2020-12-21 DIAGNOSIS — I69354 Hemiplegia and hemiparesis following cerebral infarction affecting left non-dominant side: Secondary | ICD-10-CM | POA: Diagnosis not present

## 2020-12-21 DIAGNOSIS — R2681 Unsteadiness on feet: Secondary | ICD-10-CM | POA: Diagnosis not present

## 2020-12-21 DIAGNOSIS — M6281 Muscle weakness (generalized): Secondary | ICD-10-CM | POA: Diagnosis not present

## 2020-12-24 DIAGNOSIS — F0390 Unspecified dementia without behavioral disturbance: Secondary | ICD-10-CM | POA: Diagnosis not present

## 2020-12-24 DIAGNOSIS — I69354 Hemiplegia and hemiparesis following cerebral infarction affecting left non-dominant side: Secondary | ICD-10-CM | POA: Diagnosis not present

## 2020-12-24 DIAGNOSIS — M6281 Muscle weakness (generalized): Secondary | ICD-10-CM | POA: Diagnosis not present

## 2020-12-24 DIAGNOSIS — M24542 Contracture, left hand: Secondary | ICD-10-CM | POA: Diagnosis not present

## 2020-12-24 DIAGNOSIS — S72001S Fracture of unspecified part of neck of right femur, sequela: Secondary | ICD-10-CM | POA: Diagnosis not present

## 2020-12-24 DIAGNOSIS — R2681 Unsteadiness on feet: Secondary | ICD-10-CM | POA: Diagnosis not present

## 2020-12-25 DIAGNOSIS — K2100 Gastro-esophageal reflux disease with esophagitis, without bleeding: Secondary | ICD-10-CM | POA: Diagnosis not present

## 2020-12-25 DIAGNOSIS — L988 Other specified disorders of the skin and subcutaneous tissue: Secondary | ICD-10-CM | POA: Diagnosis not present

## 2020-12-25 DIAGNOSIS — S72001S Fracture of unspecified part of neck of right femur, sequela: Secondary | ICD-10-CM | POA: Diagnosis not present

## 2020-12-25 DIAGNOSIS — L259 Unspecified contact dermatitis, unspecified cause: Secondary | ICD-10-CM | POA: Diagnosis not present

## 2020-12-25 DIAGNOSIS — R2681 Unsteadiness on feet: Secondary | ICD-10-CM | POA: Diagnosis not present

## 2020-12-25 DIAGNOSIS — Z889 Allergy status to unspecified drugs, medicaments and biological substances status: Secondary | ICD-10-CM | POA: Diagnosis not present

## 2020-12-25 DIAGNOSIS — Z9109 Other allergy status, other than to drugs and biological substances: Secondary | ICD-10-CM | POA: Diagnosis not present

## 2020-12-25 DIAGNOSIS — Z91018 Allergy to other foods: Secondary | ICD-10-CM | POA: Diagnosis not present

## 2020-12-25 DIAGNOSIS — M24542 Contracture, left hand: Secondary | ICD-10-CM | POA: Diagnosis not present

## 2020-12-25 DIAGNOSIS — I69354 Hemiplegia and hemiparesis following cerebral infarction affecting left non-dominant side: Secondary | ICD-10-CM | POA: Diagnosis not present

## 2020-12-25 DIAGNOSIS — M6281 Muscle weakness (generalized): Secondary | ICD-10-CM | POA: Diagnosis not present

## 2020-12-25 DIAGNOSIS — F0390 Unspecified dementia without behavioral disturbance: Secondary | ICD-10-CM | POA: Diagnosis not present

## 2020-12-26 DIAGNOSIS — I69354 Hemiplegia and hemiparesis following cerebral infarction affecting left non-dominant side: Secondary | ICD-10-CM | POA: Diagnosis not present

## 2020-12-26 DIAGNOSIS — M24542 Contracture, left hand: Secondary | ICD-10-CM | POA: Diagnosis not present

## 2020-12-26 DIAGNOSIS — R2681 Unsteadiness on feet: Secondary | ICD-10-CM | POA: Diagnosis not present

## 2020-12-26 DIAGNOSIS — M6281 Muscle weakness (generalized): Secondary | ICD-10-CM | POA: Diagnosis not present

## 2020-12-26 DIAGNOSIS — F0390 Unspecified dementia without behavioral disturbance: Secondary | ICD-10-CM | POA: Diagnosis not present

## 2020-12-26 DIAGNOSIS — S72001S Fracture of unspecified part of neck of right femur, sequela: Secondary | ICD-10-CM | POA: Diagnosis not present

## 2020-12-28 DIAGNOSIS — L259 Unspecified contact dermatitis, unspecified cause: Secondary | ICD-10-CM | POA: Diagnosis not present

## 2020-12-31 DIAGNOSIS — F0390 Unspecified dementia without behavioral disturbance: Secondary | ICD-10-CM | POA: Diagnosis not present

## 2020-12-31 DIAGNOSIS — S72001S Fracture of unspecified part of neck of right femur, sequela: Secondary | ICD-10-CM | POA: Diagnosis not present

## 2020-12-31 DIAGNOSIS — R2681 Unsteadiness on feet: Secondary | ICD-10-CM | POA: Diagnosis not present

## 2020-12-31 DIAGNOSIS — I69354 Hemiplegia and hemiparesis following cerebral infarction affecting left non-dominant side: Secondary | ICD-10-CM | POA: Diagnosis not present

## 2020-12-31 DIAGNOSIS — M6281 Muscle weakness (generalized): Secondary | ICD-10-CM | POA: Diagnosis not present

## 2020-12-31 DIAGNOSIS — M24542 Contracture, left hand: Secondary | ICD-10-CM | POA: Diagnosis not present

## 2021-01-01 DIAGNOSIS — M6281 Muscle weakness (generalized): Secondary | ICD-10-CM | POA: Diagnosis not present

## 2021-01-01 DIAGNOSIS — I69354 Hemiplegia and hemiparesis following cerebral infarction affecting left non-dominant side: Secondary | ICD-10-CM | POA: Diagnosis not present

## 2021-01-01 DIAGNOSIS — L988 Other specified disorders of the skin and subcutaneous tissue: Secondary | ICD-10-CM | POA: Diagnosis not present

## 2021-01-01 DIAGNOSIS — F0390 Unspecified dementia without behavioral disturbance: Secondary | ICD-10-CM | POA: Diagnosis not present

## 2021-01-01 DIAGNOSIS — R2681 Unsteadiness on feet: Secondary | ICD-10-CM | POA: Diagnosis not present

## 2021-01-01 DIAGNOSIS — S72001S Fracture of unspecified part of neck of right femur, sequela: Secondary | ICD-10-CM | POA: Diagnosis not present

## 2021-01-01 DIAGNOSIS — M24542 Contracture, left hand: Secondary | ICD-10-CM | POA: Diagnosis not present

## 2021-01-02 DIAGNOSIS — R2681 Unsteadiness on feet: Secondary | ICD-10-CM | POA: Diagnosis not present

## 2021-01-02 DIAGNOSIS — I69354 Hemiplegia and hemiparesis following cerebral infarction affecting left non-dominant side: Secondary | ICD-10-CM | POA: Diagnosis not present

## 2021-01-02 DIAGNOSIS — M6281 Muscle weakness (generalized): Secondary | ICD-10-CM | POA: Diagnosis not present

## 2021-01-02 DIAGNOSIS — M24542 Contracture, left hand: Secondary | ICD-10-CM | POA: Diagnosis not present

## 2021-01-02 DIAGNOSIS — F0390 Unspecified dementia without behavioral disturbance: Secondary | ICD-10-CM | POA: Diagnosis not present

## 2021-01-02 DIAGNOSIS — S72001S Fracture of unspecified part of neck of right femur, sequela: Secondary | ICD-10-CM | POA: Diagnosis not present

## 2021-01-07 DIAGNOSIS — M24542 Contracture, left hand: Secondary | ICD-10-CM | POA: Diagnosis not present

## 2021-01-07 DIAGNOSIS — I69391 Dysphagia following cerebral infarction: Secondary | ICD-10-CM | POA: Diagnosis not present

## 2021-01-07 DIAGNOSIS — F0390 Unspecified dementia without behavioral disturbance: Secondary | ICD-10-CM | POA: Diagnosis not present

## 2021-01-07 DIAGNOSIS — M6281 Muscle weakness (generalized): Secondary | ICD-10-CM | POA: Diagnosis not present

## 2021-01-07 DIAGNOSIS — I672 Cerebral atherosclerosis: Secondary | ICD-10-CM | POA: Diagnosis not present

## 2021-01-07 DIAGNOSIS — I69354 Hemiplegia and hemiparesis following cerebral infarction affecting left non-dominant side: Secondary | ICD-10-CM | POA: Diagnosis not present

## 2021-01-07 DIAGNOSIS — I69398 Other sequelae of cerebral infarction: Secondary | ICD-10-CM | POA: Diagnosis not present

## 2021-01-07 DIAGNOSIS — R2681 Unsteadiness on feet: Secondary | ICD-10-CM | POA: Diagnosis not present

## 2021-01-07 DIAGNOSIS — Z515 Encounter for palliative care: Secondary | ICD-10-CM | POA: Diagnosis not present

## 2021-01-07 DIAGNOSIS — R634 Abnormal weight loss: Secondary | ICD-10-CM | POA: Diagnosis not present

## 2021-01-07 DIAGNOSIS — S72001S Fracture of unspecified part of neck of right femur, sequela: Secondary | ICD-10-CM | POA: Diagnosis not present

## 2021-01-08 DIAGNOSIS — R2681 Unsteadiness on feet: Secondary | ICD-10-CM | POA: Diagnosis not present

## 2021-01-08 DIAGNOSIS — I69354 Hemiplegia and hemiparesis following cerebral infarction affecting left non-dominant side: Secondary | ICD-10-CM | POA: Diagnosis not present

## 2021-01-08 DIAGNOSIS — M6281 Muscle weakness (generalized): Secondary | ICD-10-CM | POA: Diagnosis not present

## 2021-01-08 DIAGNOSIS — F0390 Unspecified dementia without behavioral disturbance: Secondary | ICD-10-CM | POA: Diagnosis not present

## 2021-01-08 DIAGNOSIS — L988 Other specified disorders of the skin and subcutaneous tissue: Secondary | ICD-10-CM | POA: Diagnosis not present

## 2021-01-08 DIAGNOSIS — M24542 Contracture, left hand: Secondary | ICD-10-CM | POA: Diagnosis not present

## 2021-01-08 DIAGNOSIS — L89153 Pressure ulcer of sacral region, stage 3: Secondary | ICD-10-CM | POA: Diagnosis not present

## 2021-01-08 DIAGNOSIS — S72001S Fracture of unspecified part of neck of right femur, sequela: Secondary | ICD-10-CM | POA: Diagnosis not present

## 2021-01-08 DIAGNOSIS — L89150 Pressure ulcer of sacral region, unstageable: Secondary | ICD-10-CM | POA: Diagnosis not present

## 2021-01-09 DIAGNOSIS — S72001S Fracture of unspecified part of neck of right femur, sequela: Secondary | ICD-10-CM | POA: Diagnosis not present

## 2021-01-09 DIAGNOSIS — F0390 Unspecified dementia without behavioral disturbance: Secondary | ICD-10-CM | POA: Diagnosis not present

## 2021-01-09 DIAGNOSIS — M24542 Contracture, left hand: Secondary | ICD-10-CM | POA: Diagnosis not present

## 2021-01-09 DIAGNOSIS — R2681 Unsteadiness on feet: Secondary | ICD-10-CM | POA: Diagnosis not present

## 2021-01-09 DIAGNOSIS — M6281 Muscle weakness (generalized): Secondary | ICD-10-CM | POA: Diagnosis not present

## 2021-01-09 DIAGNOSIS — I69354 Hemiplegia and hemiparesis following cerebral infarction affecting left non-dominant side: Secondary | ICD-10-CM | POA: Diagnosis not present

## 2021-01-15 ENCOUNTER — Encounter (HOSPITAL_COMMUNITY): Payer: Self-pay

## 2021-01-15 ENCOUNTER — Emergency Department (HOSPITAL_COMMUNITY): Payer: Medicare Other

## 2021-01-15 ENCOUNTER — Other Ambulatory Visit: Payer: Self-pay

## 2021-01-15 ENCOUNTER — Emergency Department (HOSPITAL_COMMUNITY)
Admission: EM | Admit: 2021-01-15 | Discharge: 2021-01-15 | Disposition: A | Discharge status: Skilled Nursing Facility | Payer: Medicare Other | Attending: Emergency Medicine | Admitting: Emergency Medicine

## 2021-01-15 DIAGNOSIS — S81002A Unspecified open wound, left knee, initial encounter: Secondary | ICD-10-CM | POA: Diagnosis not present

## 2021-01-15 DIAGNOSIS — R4182 Altered mental status, unspecified: Secondary | ICD-10-CM | POA: Diagnosis not present

## 2021-01-15 DIAGNOSIS — B354 Tinea corporis: Secondary | ICD-10-CM | POA: Diagnosis not present

## 2021-01-15 DIAGNOSIS — G319 Degenerative disease of nervous system, unspecified: Secondary | ICD-10-CM | POA: Diagnosis not present

## 2021-01-15 DIAGNOSIS — I4891 Unspecified atrial fibrillation: Secondary | ICD-10-CM | POA: Diagnosis not present

## 2021-01-15 DIAGNOSIS — W19XXXA Unspecified fall, initial encounter: Secondary | ICD-10-CM

## 2021-01-15 DIAGNOSIS — Z043 Encounter for examination and observation following other accident: Secondary | ICD-10-CM | POA: Diagnosis not present

## 2021-01-15 DIAGNOSIS — R293 Abnormal posture: Secondary | ICD-10-CM | POA: Diagnosis not present

## 2021-01-15 DIAGNOSIS — M25512 Pain in left shoulder: Secondary | ICD-10-CM | POA: Diagnosis not present

## 2021-01-15 DIAGNOSIS — U071 COVID-19: Secondary | ICD-10-CM | POA: Diagnosis not present

## 2021-01-15 DIAGNOSIS — I2510 Atherosclerotic heart disease of native coronary artery without angina pectoris: Secondary | ICD-10-CM | POA: Diagnosis not present

## 2021-01-15 DIAGNOSIS — W06XXXA Fall from bed, initial encounter: Secondary | ICD-10-CM | POA: Insufficient documentation

## 2021-01-15 DIAGNOSIS — S2242XA Multiple fractures of ribs, left side, initial encounter for closed fracture: Secondary | ICD-10-CM | POA: Diagnosis not present

## 2021-01-15 DIAGNOSIS — F03918 Unspecified dementia, unspecified severity, with other behavioral disturbance: Secondary | ICD-10-CM | POA: Diagnosis not present

## 2021-01-15 DIAGNOSIS — M47814 Spondylosis without myelopathy or radiculopathy, thoracic region: Secondary | ICD-10-CM | POA: Diagnosis not present

## 2021-01-15 DIAGNOSIS — L299 Pruritus, unspecified: Secondary | ICD-10-CM | POA: Diagnosis not present

## 2021-01-15 DIAGNOSIS — L89154 Pressure ulcer of sacral region, stage 4: Secondary | ICD-10-CM | POA: Diagnosis not present

## 2021-01-15 DIAGNOSIS — S42002A Fracture of unspecified part of left clavicle, initial encounter for closed fracture: Secondary | ICD-10-CM | POA: Diagnosis not present

## 2021-01-15 DIAGNOSIS — S42025A Nondisplaced fracture of shaft of left clavicle, initial encounter for closed fracture: Secondary | ICD-10-CM | POA: Insufficient documentation

## 2021-01-15 DIAGNOSIS — S40021A Contusion of right upper arm, initial encounter: Secondary | ICD-10-CM | POA: Diagnosis not present

## 2021-01-15 DIAGNOSIS — S60511A Abrasion of right hand, initial encounter: Secondary | ICD-10-CM | POA: Diagnosis not present

## 2021-01-15 DIAGNOSIS — R2689 Other abnormalities of gait and mobility: Secondary | ICD-10-CM | POA: Diagnosis not present

## 2021-01-15 DIAGNOSIS — R5381 Other malaise: Secondary | ICD-10-CM | POA: Diagnosis not present

## 2021-01-15 DIAGNOSIS — S0990XA Unspecified injury of head, initial encounter: Secondary | ICD-10-CM | POA: Diagnosis not present

## 2021-01-15 DIAGNOSIS — M24549 Contracture, unspecified hand: Secondary | ICD-10-CM | POA: Diagnosis not present

## 2021-01-15 DIAGNOSIS — S41111A Laceration without foreign body of right upper arm, initial encounter: Secondary | ICD-10-CM | POA: Diagnosis not present

## 2021-01-15 DIAGNOSIS — S51012A Laceration without foreign body of left elbow, initial encounter: Secondary | ICD-10-CM | POA: Diagnosis not present

## 2021-01-15 DIAGNOSIS — S41101A Unspecified open wound of right upper arm, initial encounter: Secondary | ICD-10-CM | POA: Diagnosis not present

## 2021-01-15 DIAGNOSIS — L89893 Pressure ulcer of other site, stage 3: Secondary | ICD-10-CM | POA: Diagnosis not present

## 2021-01-15 DIAGNOSIS — L89159 Pressure ulcer of sacral region, unspecified stage: Secondary | ICD-10-CM | POA: Diagnosis not present

## 2021-01-15 DIAGNOSIS — H109 Unspecified conjunctivitis: Secondary | ICD-10-CM | POA: Diagnosis not present

## 2021-01-15 DIAGNOSIS — R9082 White matter disease, unspecified: Secondary | ICD-10-CM | POA: Diagnosis not present

## 2021-01-15 DIAGNOSIS — B351 Tinea unguium: Secondary | ICD-10-CM | POA: Diagnosis not present

## 2021-01-15 DIAGNOSIS — R911 Solitary pulmonary nodule: Secondary | ICD-10-CM | POA: Diagnosis not present

## 2021-01-15 DIAGNOSIS — Z87891 Personal history of nicotine dependence: Secondary | ICD-10-CM | POA: Insufficient documentation

## 2021-01-15 DIAGNOSIS — Z951 Presence of aortocoronary bypass graft: Secondary | ICD-10-CM | POA: Diagnosis not present

## 2021-01-15 DIAGNOSIS — F0150 Vascular dementia without behavioral disturbance: Secondary | ICD-10-CM | POA: Diagnosis not present

## 2021-01-15 DIAGNOSIS — L239 Allergic contact dermatitis, unspecified cause: Secondary | ICD-10-CM | POA: Diagnosis not present

## 2021-01-15 DIAGNOSIS — E46 Unspecified protein-calorie malnutrition: Secondary | ICD-10-CM | POA: Diagnosis not present

## 2021-01-15 DIAGNOSIS — S80812A Abrasion, left lower leg, initial encounter: Secondary | ICD-10-CM | POA: Diagnosis not present

## 2021-01-15 DIAGNOSIS — R410 Disorientation, unspecified: Secondary | ICD-10-CM | POA: Diagnosis not present

## 2021-01-15 DIAGNOSIS — F0390 Unspecified dementia without behavioral disturbance: Secondary | ICD-10-CM | POA: Diagnosis not present

## 2021-01-15 DIAGNOSIS — M2450 Contracture, unspecified joint: Secondary | ICD-10-CM | POA: Diagnosis not present

## 2021-01-15 DIAGNOSIS — M6249 Contracture of muscle, multiple sites: Secondary | ICD-10-CM | POA: Diagnosis not present

## 2021-01-15 DIAGNOSIS — S81012A Laceration without foreign body, left knee, initial encounter: Secondary | ICD-10-CM | POA: Diagnosis not present

## 2021-01-15 DIAGNOSIS — S81811A Laceration without foreign body, right lower leg, initial encounter: Secondary | ICD-10-CM | POA: Diagnosis not present

## 2021-01-15 DIAGNOSIS — T07XXXA Unspecified multiple injuries, initial encounter: Secondary | ICD-10-CM

## 2021-01-15 DIAGNOSIS — M810 Age-related osteoporosis without current pathological fracture: Secondary | ICD-10-CM | POA: Diagnosis not present

## 2021-01-15 DIAGNOSIS — R2681 Unsteadiness on feet: Secondary | ICD-10-CM | POA: Diagnosis not present

## 2021-01-15 DIAGNOSIS — I959 Hypotension, unspecified: Secondary | ICD-10-CM | POA: Diagnosis not present

## 2021-01-15 DIAGNOSIS — S0083XA Contusion of other part of head, initial encounter: Secondary | ICD-10-CM | POA: Diagnosis not present

## 2021-01-15 DIAGNOSIS — M6281 Muscle weakness (generalized): Secondary | ICD-10-CM | POA: Diagnosis not present

## 2021-01-15 DIAGNOSIS — T148XXA Other injury of unspecified body region, initial encounter: Secondary | ICD-10-CM | POA: Diagnosis not present

## 2021-01-15 DIAGNOSIS — W1839XA Other fall on same level, initial encounter: Secondary | ICD-10-CM | POA: Diagnosis not present

## 2021-01-15 DIAGNOSIS — S41112A Laceration without foreign body of left upper arm, initial encounter: Secondary | ICD-10-CM | POA: Diagnosis not present

## 2021-01-15 DIAGNOSIS — S51011A Laceration without foreign body of right elbow, initial encounter: Secondary | ICD-10-CM | POA: Diagnosis not present

## 2021-01-15 DIAGNOSIS — I10 Essential (primary) hypertension: Secondary | ICD-10-CM | POA: Diagnosis not present

## 2021-01-15 DIAGNOSIS — M25552 Pain in left hip: Secondary | ICD-10-CM | POA: Diagnosis not present

## 2021-01-15 DIAGNOSIS — S81801A Unspecified open wound, right lower leg, initial encounter: Secondary | ICD-10-CM | POA: Diagnosis not present

## 2021-01-15 DIAGNOSIS — S40012A Contusion of left shoulder, initial encounter: Secondary | ICD-10-CM | POA: Diagnosis not present

## 2021-01-15 DIAGNOSIS — Z96641 Presence of right artificial hip joint: Secondary | ICD-10-CM | POA: Insufficient documentation

## 2021-01-15 DIAGNOSIS — S4992XA Unspecified injury of left shoulder and upper arm, initial encounter: Secondary | ICD-10-CM | POA: Diagnosis present

## 2021-01-15 DIAGNOSIS — S80212A Abrasion, left knee, initial encounter: Secondary | ICD-10-CM | POA: Diagnosis not present

## 2021-01-15 DIAGNOSIS — L89153 Pressure ulcer of sacral region, stage 3: Secondary | ICD-10-CM | POA: Diagnosis not present

## 2021-01-15 DIAGNOSIS — S40812A Abrasion of left upper arm, initial encounter: Secondary | ICD-10-CM | POA: Diagnosis not present

## 2021-01-15 DIAGNOSIS — K2270 Barrett's esophagus without dysplasia: Secondary | ICD-10-CM | POA: Diagnosis not present

## 2021-01-15 DIAGNOSIS — R110 Nausea: Secondary | ICD-10-CM | POA: Diagnosis not present

## 2021-01-15 DIAGNOSIS — D508 Other iron deficiency anemias: Secondary | ICD-10-CM | POA: Diagnosis not present

## 2021-01-15 DIAGNOSIS — Z79899 Other long term (current) drug therapy: Secondary | ICD-10-CM | POA: Insufficient documentation

## 2021-01-15 DIAGNOSIS — R532 Functional quadriplegia: Secondary | ICD-10-CM | POA: Diagnosis not present

## 2021-01-15 DIAGNOSIS — S199XXA Unspecified injury of neck, initial encounter: Secondary | ICD-10-CM | POA: Diagnosis not present

## 2021-01-15 DIAGNOSIS — R109 Unspecified abdominal pain: Secondary | ICD-10-CM | POA: Diagnosis not present

## 2021-01-15 DIAGNOSIS — M1712 Unilateral primary osteoarthritis, left knee: Secondary | ICD-10-CM | POA: Diagnosis not present

## 2021-01-15 DIAGNOSIS — Z7982 Long term (current) use of aspirin: Secondary | ICD-10-CM | POA: Insufficient documentation

## 2021-01-15 DIAGNOSIS — S0003XA Contusion of scalp, initial encounter: Secondary | ICD-10-CM | POA: Diagnosis not present

## 2021-01-15 DIAGNOSIS — G8190 Hemiplegia, unspecified affecting unspecified side: Secondary | ICD-10-CM | POA: Diagnosis not present

## 2021-01-15 DIAGNOSIS — G8929 Other chronic pain: Secondary | ICD-10-CM | POA: Diagnosis not present

## 2021-01-15 DIAGNOSIS — R404 Transient alteration of awareness: Secondary | ICD-10-CM | POA: Diagnosis not present

## 2021-01-15 DIAGNOSIS — S42025D Nondisplaced fracture of shaft of left clavicle, subsequent encounter for fracture with routine healing: Secondary | ICD-10-CM | POA: Diagnosis not present

## 2021-01-15 DIAGNOSIS — R000 Tachycardia, unspecified: Secondary | ICD-10-CM | POA: Diagnosis not present

## 2021-01-15 DIAGNOSIS — R519 Headache, unspecified: Secondary | ICD-10-CM | POA: Diagnosis not present

## 2021-01-15 DIAGNOSIS — S41012A Laceration without foreign body of left shoulder, initial encounter: Secondary | ICD-10-CM | POA: Diagnosis not present

## 2021-01-15 DIAGNOSIS — R296 Repeated falls: Secondary | ICD-10-CM | POA: Diagnosis not present

## 2021-01-15 DIAGNOSIS — I119 Hypertensive heart disease without heart failure: Secondary | ICD-10-CM | POA: Insufficient documentation

## 2021-01-15 DIAGNOSIS — R0602 Shortness of breath: Secondary | ICD-10-CM | POA: Diagnosis not present

## 2021-01-15 DIAGNOSIS — M7918 Myalgia, other site: Secondary | ICD-10-CM | POA: Diagnosis not present

## 2021-01-15 DIAGNOSIS — D649 Anemia, unspecified: Secondary | ICD-10-CM | POA: Diagnosis not present

## 2021-01-15 DIAGNOSIS — F5101 Primary insomnia: Secondary | ICD-10-CM | POA: Diagnosis not present

## 2021-01-15 DIAGNOSIS — Z7401 Bed confinement status: Secondary | ICD-10-CM | POA: Diagnosis not present

## 2021-01-15 DIAGNOSIS — Z889 Allergy status to unspecified drugs, medicaments and biological substances status: Secondary | ICD-10-CM | POA: Diagnosis not present

## 2021-01-15 DIAGNOSIS — I6381 Other cerebral infarction due to occlusion or stenosis of small artery: Secondary | ICD-10-CM | POA: Diagnosis not present

## 2021-01-15 DIAGNOSIS — R41841 Cognitive communication deficit: Secondary | ICD-10-CM | POA: Diagnosis not present

## 2021-01-15 DIAGNOSIS — M47812 Spondylosis without myelopathy or radiculopathy, cervical region: Secondary | ICD-10-CM | POA: Diagnosis not present

## 2021-01-15 DIAGNOSIS — S41002A Unspecified open wound of left shoulder, initial encounter: Secondary | ICD-10-CM | POA: Diagnosis not present

## 2021-01-15 DIAGNOSIS — R278 Other lack of coordination: Secondary | ICD-10-CM | POA: Diagnosis not present

## 2021-01-15 DIAGNOSIS — S81802A Unspecified open wound, left lower leg, initial encounter: Secondary | ICD-10-CM | POA: Diagnosis not present

## 2021-01-15 DIAGNOSIS — S72001S Fracture of unspecified part of neck of right femur, sequela: Secondary | ICD-10-CM | POA: Diagnosis not present

## 2021-01-15 DIAGNOSIS — R220 Localized swelling, mass and lump, head: Secondary | ICD-10-CM | POA: Diagnosis not present

## 2021-01-15 DIAGNOSIS — F424 Excoriation (skin-picking) disorder: Secondary | ICD-10-CM | POA: Diagnosis not present

## 2021-01-15 DIAGNOSIS — R451 Restlessness and agitation: Secondary | ICD-10-CM | POA: Diagnosis not present

## 2021-01-15 DIAGNOSIS — R1312 Dysphagia, oropharyngeal phase: Secondary | ICD-10-CM | POA: Diagnosis not present

## 2021-01-15 DIAGNOSIS — Z20822 Contact with and (suspected) exposure to covid-19: Secondary | ICD-10-CM | POA: Diagnosis not present

## 2021-01-15 DIAGNOSIS — F330 Major depressive disorder, recurrent, mild: Secondary | ICD-10-CM | POA: Diagnosis not present

## 2021-01-15 DIAGNOSIS — E8809 Other disorders of plasma-protein metabolism, not elsewhere classified: Secondary | ICD-10-CM | POA: Diagnosis not present

## 2021-01-15 MED ORDER — LIDOCAINE-EPINEPHRINE (PF) 2 %-1:200000 IJ SOLN
20.0000 mL | Freq: Once | INTRAMUSCULAR | Status: AC
  Administered 2021-01-15: 20 mL

## 2021-01-15 NOTE — ED Provider Notes (Signed)
[facility] DEPT Provider Note   CSN: [id number] Arrival date & time: 01/15/21  0138     History Chief Complaint  Patient presents with   [name]    ANNASTASIA Pope is a 87 y.o. female.  Patient presents to the emergency department with a chief complaint of fall.  This was an unwitnessed fall from her rehab center.  She has history of dementia, but is reportedly at her baseline.  She is alert to person and place, but not time.  She denies any pain, but states that she is "itchy."  She sustained some skin tears and abrasions, which are new.  History corroborated with [facility] [place].  The history is provided by the patient and the EMS personnel. No language interpreter was used.      Past Medical History:  Diagnosis Date   Ankle fracture    Anxiety    Arthritis    Barrett's esophagus    Chronic atrial fibrillation ([place])    a. Pt previously declined DCCV.   Coronary artery disease    a. s/p Cypher DES to LAD in 2006;  b. Last LHC 02/2008:  pLAD 30%, mLAD stent patent, pOM 50%, EF 65%;  c. Lexiscan Myoview 7/14:  Intermediate risk, dist ant and inf-lat ischemia, EF 66% - patient declined cath and elected medical therapy.   Dementia ([place])    Depression 2020   Dysrhythmia    GERD (gastroesophageal reflux disease)    Hiatal hernia    Hx of echocardiogram    Echocardiogram 08/17/12: EF 55-60%, MAC, mild MR, mild LAE, mild RVE, mild to moderate RAE, moderate TR, mildly increased pulmonary artery systolic pressures   Hyperlipidemia    Hypertension    Mitral regurgitation    a. Mod by echo 10/2014.   Obesity    Pulmonary hypertension ([place])    a. Mod-severely elevated PA pressures by echo 10/2014.   QT prolongation    a. During 10/2014 admit, QTC 57m.   Stroke ([facility] 12/18/2017   Tricuspid regurgitation    a. Mod by echo 10/2014.   UTI (lower urinary tract infection) 03/2015    Patient Active Problem List   Diagnosis Date Noted   Closed  right hip fracture ([facility] 07/13/2020   Scalp laceration 07/13/2020   DNR (do not resuscitate) 07/13/2020   Fall 07/13/2020   Pneumothorax on left 06/30/2019   Lewy body dementia ([facility] 05/18/2019   Recurrent UTI--E coli/Pseudomonas 01/18/2018   Orthostasis 01/18/2018   Benign essential HTN    Cerebral edema (HCC)    Hypomagnesemia    Dysphagia, post-stroke    Lethargy    ABLA (acute blood loss anemia)    Hypoalbuminemia due to protein-calorie malnutrition ([facility]    History of Barrett's esophagus    Urinary retention    Seizure prophylaxis    E. coli UTI    Intraventricular hemorrhage (HCC)    Urinary tract infection without hematuria    Coronary artery disease involving native coronary artery of native heart without angina pectoris    Pulmonary HTN (HCC)    Leukocytosis    Intracranial hemorrhage ([facility] 12/18/2017   Syncope 04/10/2015   Sepsis secondary to UTI ([facility] 04/10/2015   URI (upper respiratory infection) 04/10/2015   UTI (lower urinary tract infection) 04/10/2015   QT prolongation 10/12/2014   Headache 09/02/2012   Tricuspid valve regurgitation, moderate 08/18/2012   Hypokalemia 08/16/2012   Hyponatremia 08/16/2012   Atrial fibrillation ([facility]    Hypertension  Anxiety    Mixed hyperlipidemia 09/11/2009   GERD 08/30/2008   CHEST PAIN 08/30/2008   Coronary atherosclerosis 04/04/2008   Syncope and collapse 04/04/2008    Past Surgical History:  Procedure Laterality Date   ABDOMINAL HYSTERECTOMY     ANKLE SURGERY  2001   BREAST EXCISIONAL BIOPSY Right 1970   BREAST EXCISIONAL BIOPSY Right    BREAST EXCISIONAL BIOPSY Left    BREAST SURGERY  1970   CESAREAN SECTION     CORONARY ANGIOPLASTY     ESOPHAGOGASTRODUODENOSCOPY     HIP ARTHROPLASTY Right 07/13/2020   Procedure: ARTHROPLASTY BIPOLAR HIP (HEMIARTHROPLASTY);  Surgeon: Rod Can, MD;  Location: [facility];  Service: Orthopedics;  Laterality: Right;     OB History   No obstetric history on file.      Family History  Problem Relation Age of Onset   Diabetes Mother    Heart attack Mother    Lung cancer Father    Thyroid disease Sister    Thyroid disease Sister    Hypertension Sister    Hypertension Sister    Hypertension Sister    Hypertension Sister    Hypertension Brother    Hypertension Brother    Breast cancer Other 42   Colon cancer Neg Hx    Colon polyps Neg Hx    Esophageal cancer Neg Hx    Stroke Neg Hx     Social History   Tobacco Use   Smoking status: Former    Types: Cigarettes   Smokeless tobacco: Never  Vaping Use   Vaping Use: Never used  Substance Use Topics   Alcohol use: No    Alcohol/week: 0.0 standard drinks   Drug use: No    Home Medications Prior to Admission medications   Medication Sig Start Date End Date Taking? Authorizing Provider  acetaminophen (TYLENOL) 325 MG tablet Take 650 mg by mouth 2 (two) times daily.   Yes [provider]  baclofen (LIORESAL) 5 mg TABS tablet Take 5 mg by mouth 2 (two) times daily.   Yes [provider]  collagenase (SANTYL) ointment Apply 1 application topically daily. Apply to sacrum ulcer daily   Yes [provider]  docusate sodium (COLACE) 100 MG capsule Take 1 capsule (100 mg total) by mouth 2 (two) times daily. Patient taking differently: Take 200 mg by mouth 2 (two) times daily. 01/18/18  Yes Love, Ivan Anchors, PA-C  famotidine (PEPCID) 40 MG tablet Take 40 mg by mouth daily.   Yes [provider]  ferrous sulfate 325 (65 FE) MG tablet Take 325 mg by mouth daily with breakfast.   Yes [provider]  irbesartan (AVAPRO) 75 MG tablet Take 75 mg by mouth daily.   Yes [provider]  levETIRAcetam (KEPPRA) 500 MG tablet Take 1 tablet (500 mg total) by mouth 2 (two) times daily. 12/23/17  Yes Costella, Vista Mink, PA-C  magnesium oxide (MAG-OX) 400 MG tablet Take 400 mg by mouth daily.   Yes [provider]  Melatonin 10 MG TABS Take 10 mg by mouth  at bedtime.   Yes [provider]  metoprolol tartrate (LOPRESSOR) 25 MG tablet Take 25 mg by mouth daily.   Yes [provider]  Multiple Vitamin (MULTIVITAMIN WITH MINERALS) TABS tablet Take 1 tablet by mouth daily. 01/19/18  Yes Love, Ivan Anchors, PA-C  nitroGLYCERIN (NITROSTAT) 0.4 MG SL tablet Place 1 tablet (0.4 mg total) under the tongue every 5 (five) minutes as needed for chest pain (  3 doses only). 01/18/18  Yes Love, Ivan Anchors, PA-C  Nutritional Supplements (NUTRITIONAL SUPPLEMENT PO) Take 120 mLs by mouth 3 (three) times daily between meals.   Yes [provider]  nystatin (MYCOSTATIN/NYSTOP) powder Apply 1 application topically daily.   Yes [provider]  nystatin ointment (MYCOSTATIN) Apply 1 application topically daily. Apply to satellite rash to resident lower back   Yes [provider]  polyethylene glycol (MIRALAX / GLYCOLAX) packet Take 17 g by mouth daily. 01/19/18  Yes Love, Ivan Anchors, PA-C  sertraline (ZOLOFT) 50 MG tablet Take 50 mg by mouth daily.   Yes [provider]  Skin Protectants, Misc. (EUCERIN) cream Apply 1 application topically 2 (two) times daily. Apply thin layer to the rash on her back   Yes [provider]  acetaminophen (TYLENOL) 500 MG tablet Take 1 tablet (500 mg total) by mouth every 6 (six) hours as needed for mild pain or moderate pain. 07/19/20   Rai, Vernelle Emerald, MD  aspirin EC 325 MG EC tablet Take 1 tablet (325 mg total) by mouth daily with breakfast. Patient taking differently: Take 325 mg by mouth 2 (two) times daily. 07/20/20   Rai, Vernelle Emerald, MD  HYDROcodone-acetaminophen (NORCO/VICODIN) 5-325 MG tablet Take 1 tablet by mouth every 6 (six) hours as needed for moderate pain or severe pain. 07/19/20   Rai, Vernelle Emerald, MD  omeprazole (PRILOSEC OTC) 20 MG tablet Take 20 mg by mouth daily.    [provider]  pantoprazole (PROTONIX) 40 MG tablet Take 1 tablet (40 mg total) by mouth daily. 07/20/20    Rai, Vernelle Emerald, MD    Allergies    Amlodipine, Methyldopa, Shellfish-derived products, Tape, Actonel [risedronate sodium], Bactrim [sulfamethoxazole-trimethoprim], Ciprofloxacin hcl, Gas-x [simethicone], Hydralazine hcl, Lactose intolerance (gi), Lisinopril, Milk-related compounds, Other, Oxycodone hcl, Pneumococcal vaccines, Valsartan, Cozaar [losartan], Digitek [digoxin], Felodipine, Hctz [hydrochlorothiazide], Simvastatin, and Spironolactone  Review of Systems   Review of Systems  All other systems reviewed and are negative.  Physical Exam Updated Vital Signs BP (!) 173/63 (BP Location: Left Arm)   Pulse (!) 53   Temp (!) 97.5 F (36.4 C) (Oral)   Resp 13   Ht _0  (1.676 m)   Wt 62 kg   SpO2 98%   BMI 22.06 kg/m   Physical Exam Vitals and nursing note reviewed.  Constitutional:      General: She is not in acute distress.    Appearance: She is well-developed.  HENT:     Head: Normocephalic and atraumatic.  Eyes:     Conjunctiva/sclera: Conjunctivae normal.  Cardiovascular:     Rate and Rhythm: Normal rate and regular rhythm.     Heart sounds: No murmur heard. Pulmonary:     Effort: Pulmonary effort is normal. No respiratory distress.     Breath sounds: Normal breath sounds.  Abdominal:     Palpations: Abdomen is soft.     Tenderness: There is no abdominal tenderness.  Musculoskeletal:        General: No swelling.     Cervical back: Neck supple.     Comments: Extremities are held in contracture Contusion and deformity of left shoulder  Skin:    General: Skin is warm and dry.     Capillary Refill: Capillary refill takes less than 2 seconds.     Comments: Skin tear to left shoulder Laceration to left anterior knee  Neurological:     Mental Status: She is alert.  Psychiatric:  Mood and Affect: Mood normal.    ED Results / Procedures / Treatments   Labs (all labs ordered are listed, but only abnormal results are displayed) Labs Reviewed - No data to  display  EKG None  Radiology No results found.  Procedures .Marland KitchenLaceration Repair  Date/Time: 01/15/2021 5:26 AM Performed by: Montine Circle, PA-C Authorized by: Montine Circle, PA-C   Consent:    Consent obtained:  Verbal   Consent given by:  Patient   Risks discussed:  Infection, need for additional repair, pain, poor cosmetic result and poor wound healing   Alternatives discussed:  No treatment and delayed treatment Universal protocol:    Procedure explained and questions answered to patient or proxy's satisfaction: yes     Relevant documents present and verified: yes     Test results available: yes     Imaging studies available: yes     Required blood products, implants, devices, and special equipment available: yes     Site/side marked: yes     Immediately prior to procedure, a time out was called: yes     Patient identity confirmed:  Verbally with patient Anesthesia:    Anesthesia method:  Local infiltration   Local anesthetic:  Lidocaine 1% WITH epi Laceration details:    Location:  Leg   Leg location:  L upper leg   Length (cm):  3 Pre-procedure details:    Preparation:  Patient was prepped and draped in usual sterile fashion and imaging obtained to evaluate for foreign bodies Exploration:    Imaging outcome: foreign body not noted     Wound exploration: wound explored through full range of motion and entire depth of wound visualized   Treatment:    Area cleansed with:  Saline   Amount of cleaning:  Standard Skin repair:    Repair method:  Staples   Number of staples:  7 Approximation:    Approximation:  Close Repair type:    Repair type:  Simple Post-procedure details:    Dressing:  Open (no dressing)   Medications Ordered in ED Medications - No data to display  ED Course  I have reviewed the triage vital signs and the nursing notes.  Pertinent labs & imaging results that were available during my care of the patient were reviewed by me and  considered in my medical decision making (see chart for details).    MDM Rules/Calculators/A&P                           Patient presents after being found on the ground beside her bed at her rehab center.  I called and spoke with the caretaker at the rehab center, who says that one of the nurse techs found the patient had rolled off the bed.  She dropped about a foot and half to 2 feet.  Patient does not complain of any pain, but she does have some tenderness to palpation over the left arm and right hip.  CT scan of head shows no new intracranial trauma.  CT scan of cervical spine shows left clavicle fracture.  Patient seen by and discussed with Dr. Ralene Bathe, who adds left shoulder and left humerus and hip films.  Left knee laceration repaired by me.  Basic wound care provided to left shoulder abrasion.  Called and discussed with patient's daughter, updated on plan. Final Clinical Impression(s) / ED Diagnoses Final diagnoses:  Fall, initial encounter  Closed nondisplaced fracture of left clavicle, unspecified  part of clavicle, initial encounter  Multiple abrasions  Laceration of left knee, initial encounter    Rx / DC Orders ED Discharge Orders     None        Montine Circle, PA-C 01/15/21 0645    Quintella Reichert, MD 01/16/21 [phone]

## 2021-01-15 NOTE — ED Triage Notes (Signed)
Patient BIB [facility] from [facility] after unwitnessed fall. Maybe a foot fall out of bed, bed was in lowest position. Alert to baseline, history of dementia.   EMS 153/64 HR 66 98% Room air Multiple abrasions and skin tears to left arm/leg (new from the fall)

## 2021-01-15 NOTE — ED Notes (Signed)
Pt sent to [facility] via [facility] transport

## 2021-01-15 NOTE — ED Notes (Signed)
[facility] contacted, transport arranged for patient.

## 2021-01-16 DIAGNOSIS — W19XXXA Unspecified fall, initial encounter: Secondary | ICD-10-CM | POA: Diagnosis not present

## 2021-01-16 DIAGNOSIS — S42002A Fracture of unspecified part of left clavicle, initial encounter for closed fracture: Secondary | ICD-10-CM | POA: Diagnosis not present

## 2021-01-16 DIAGNOSIS — S81012A Laceration without foreign body, left knee, initial encounter: Secondary | ICD-10-CM | POA: Diagnosis not present

## 2021-01-16 DIAGNOSIS — G8190 Hemiplegia, unspecified affecting unspecified side: Secondary | ICD-10-CM | POA: Diagnosis not present

## 2021-01-17 DIAGNOSIS — I6381 Other cerebral infarction due to occlusion or stenosis of small artery: Secondary | ICD-10-CM | POA: Diagnosis not present

## 2021-01-17 DIAGNOSIS — R296 Repeated falls: Secondary | ICD-10-CM | POA: Diagnosis not present

## 2021-01-17 DIAGNOSIS — T148XXA Other injury of unspecified body region, initial encounter: Secondary | ICD-10-CM | POA: Diagnosis not present

## 2021-01-17 DIAGNOSIS — L89154 Pressure ulcer of sacral region, stage 4: Secondary | ICD-10-CM | POA: Diagnosis not present

## 2021-01-17 DIAGNOSIS — S42002A Fracture of unspecified part of left clavicle, initial encounter for closed fracture: Secondary | ICD-10-CM | POA: Diagnosis not present

## 2021-01-17 DIAGNOSIS — G319 Degenerative disease of nervous system, unspecified: Secondary | ICD-10-CM | POA: Diagnosis not present

## 2021-01-17 DIAGNOSIS — F0150 Vascular dementia without behavioral disturbance: Secondary | ICD-10-CM | POA: Diagnosis not present

## 2021-01-17 DIAGNOSIS — S0003XA Contusion of scalp, initial encounter: Secondary | ICD-10-CM | POA: Diagnosis not present

## 2021-01-18 ENCOUNTER — Other Ambulatory Visit: Payer: Self-pay | Admitting: *Deleted

## 2021-01-18 NOTE — Patient Outreach (Signed)
[facility] Post- Acute Care Coordinator follow up. Member screened for potential [facility] Care Management needs.   Update received from [place] indicating Mrs. Geiman will remain at [facility] for long term care.  No identifiable [facility] Care Management needs at this time.    Marthenia Rolling, MSN, RN,BSN [facility] Acute Care Coordinator [phone] [facility]) [phone]  (Toll free office)

## 2021-01-22 DIAGNOSIS — S81002A Unspecified open wound, left knee, initial encounter: Secondary | ICD-10-CM | POA: Diagnosis not present

## 2021-01-22 DIAGNOSIS — S41002A Unspecified open wound of left shoulder, initial encounter: Secondary | ICD-10-CM | POA: Diagnosis not present

## 2021-01-22 DIAGNOSIS — S81801A Unspecified open wound, right lower leg, initial encounter: Secondary | ICD-10-CM | POA: Diagnosis not present

## 2021-01-22 DIAGNOSIS — L89153 Pressure ulcer of sacral region, stage 3: Secondary | ICD-10-CM | POA: Diagnosis not present

## 2021-01-22 DIAGNOSIS — S81802A Unspecified open wound, left lower leg, initial encounter: Secondary | ICD-10-CM | POA: Diagnosis not present

## 2021-01-25 ENCOUNTER — Emergency Department (HOSPITAL_COMMUNITY): Payer: Medicare Other

## 2021-01-25 ENCOUNTER — Emergency Department (HOSPITAL_COMMUNITY)
Admission: EM | Admit: 2021-01-25 | Discharge: 2021-01-26 | Disposition: A | Discharge status: Home / Self Care | Payer: Medicare Other | Attending: Emergency Medicine | Admitting: Emergency Medicine

## 2021-01-25 DIAGNOSIS — S51012A Laceration without foreign body of left elbow, initial encounter: Secondary | ICD-10-CM | POA: Diagnosis not present

## 2021-01-25 DIAGNOSIS — Z79899 Other long term (current) drug therapy: Secondary | ICD-10-CM | POA: Insufficient documentation

## 2021-01-25 DIAGNOSIS — S81012A Laceration without foreign body, left knee, initial encounter: Secondary | ICD-10-CM | POA: Insufficient documentation

## 2021-01-25 DIAGNOSIS — M2550 Pain in unspecified joint: Secondary | ICD-10-CM

## 2021-01-25 DIAGNOSIS — S40012A Contusion of left shoulder, initial encounter: Secondary | ICD-10-CM | POA: Diagnosis not present

## 2021-01-25 DIAGNOSIS — S0083XA Contusion of other part of head, initial encounter: Secondary | ICD-10-CM | POA: Insufficient documentation

## 2021-01-25 DIAGNOSIS — R911 Solitary pulmonary nodule: Secondary | ICD-10-CM | POA: Diagnosis not present

## 2021-01-25 DIAGNOSIS — M47814 Spondylosis without myelopathy or radiculopathy, thoracic region: Secondary | ICD-10-CM | POA: Diagnosis not present

## 2021-01-25 DIAGNOSIS — S0003XA Contusion of scalp, initial encounter: Secondary | ICD-10-CM | POA: Diagnosis not present

## 2021-01-25 DIAGNOSIS — R9082 White matter disease, unspecified: Secondary | ICD-10-CM | POA: Diagnosis not present

## 2021-01-25 DIAGNOSIS — S0990XA Unspecified injury of head, initial encounter: Secondary | ICD-10-CM | POA: Diagnosis not present

## 2021-01-25 DIAGNOSIS — M25552 Pain in left hip: Secondary | ICD-10-CM | POA: Insufficient documentation

## 2021-01-25 DIAGNOSIS — W1839XA Other fall on same level, initial encounter: Secondary | ICD-10-CM | POA: Insufficient documentation

## 2021-01-25 DIAGNOSIS — M25512 Pain in left shoulder: Secondary | ICD-10-CM | POA: Diagnosis not present

## 2021-01-25 DIAGNOSIS — S2242XA Multiple fractures of ribs, left side, initial encounter for closed fracture: Secondary | ICD-10-CM | POA: Diagnosis not present

## 2021-01-25 DIAGNOSIS — Z951 Presence of aortocoronary bypass graft: Secondary | ICD-10-CM | POA: Diagnosis not present

## 2021-01-25 DIAGNOSIS — M47812 Spondylosis without myelopathy or radiculopathy, cervical region: Secondary | ICD-10-CM | POA: Diagnosis not present

## 2021-01-25 DIAGNOSIS — I2510 Atherosclerotic heart disease of native coronary artery without angina pectoris: Secondary | ICD-10-CM | POA: Diagnosis not present

## 2021-01-25 DIAGNOSIS — Z96641 Presence of right artificial hip joint: Secondary | ICD-10-CM | POA: Insufficient documentation

## 2021-01-25 DIAGNOSIS — R519 Headache, unspecified: Secondary | ICD-10-CM | POA: Insufficient documentation

## 2021-01-25 DIAGNOSIS — F0390 Unspecified dementia without behavioral disturbance: Secondary | ICD-10-CM | POA: Insufficient documentation

## 2021-01-25 DIAGNOSIS — R296 Repeated falls: Secondary | ICD-10-CM

## 2021-01-25 DIAGNOSIS — S60511A Abrasion of right hand, initial encounter: Secondary | ICD-10-CM | POA: Diagnosis not present

## 2021-01-25 DIAGNOSIS — S51011A Laceration without foreign body of right elbow, initial encounter: Secondary | ICD-10-CM | POA: Diagnosis not present

## 2021-01-25 DIAGNOSIS — W19XXXA Unspecified fall, initial encounter: Secondary | ICD-10-CM

## 2021-01-25 DIAGNOSIS — S41111A Laceration without foreign body of right upper arm, initial encounter: Secondary | ICD-10-CM | POA: Diagnosis not present

## 2021-01-25 DIAGNOSIS — Z87891 Personal history of nicotine dependence: Secondary | ICD-10-CM | POA: Diagnosis not present

## 2021-01-25 DIAGNOSIS — Z043 Encounter for examination and observation following other accident: Secondary | ICD-10-CM | POA: Diagnosis not present

## 2021-01-25 DIAGNOSIS — S199XXA Unspecified injury of neck, initial encounter: Secondary | ICD-10-CM | POA: Diagnosis not present

## 2021-01-25 DIAGNOSIS — Z20822 Contact with and (suspected) exposure to covid-19: Secondary | ICD-10-CM | POA: Diagnosis not present

## 2021-01-25 DIAGNOSIS — S40021A Contusion of right upper arm, initial encounter: Secondary | ICD-10-CM | POA: Diagnosis not present

## 2021-01-25 DIAGNOSIS — I10 Essential (primary) hypertension: Secondary | ICD-10-CM | POA: Insufficient documentation

## 2021-01-25 DIAGNOSIS — S81811A Laceration without foreign body, right lower leg, initial encounter: Secondary | ICD-10-CM | POA: Diagnosis not present

## 2021-01-25 LAB — CBC WITH DIFFERENTIAL/PLATELET
Abs Immature Granulocytes: 0.07 10*3/uL (ref 0.00–0.07)
Basophils Absolute: 0.1 10*3/uL (ref 0.0–0.1)
Basophils Relative: 1 %
Eosinophils Absolute: 0.4 10*3/uL (ref 0.0–0.5)
Eosinophils Relative: 4 %
HCT: 32.8 % — ABNORMAL LOW (ref 36.0–46.0)
Hemoglobin: 10.2 g/dL — ABNORMAL LOW (ref 12.0–15.0)
Immature Granulocytes: 1 %
Lymphocytes Relative: 24 %
Lymphs Abs: 2.5 10*3/uL (ref 0.7–4.0)
MCH: 29.1 pg (ref 26.0–34.0)
MCHC: 31.1 g/dL (ref 30.0–36.0)
MCV: 93.7 fL (ref 80.0–100.0)
Monocytes Absolute: 1.1 10*3/uL — ABNORMAL HIGH (ref 0.1–1.0)
Monocytes Relative: 10 %
Neutro Abs: 6.4 10*3/uL (ref 1.7–7.7)
Neutrophils Relative %: 60 %
Platelets: 305 10*3/uL (ref 150–400)
RBC: 3.5 MIL/uL — ABNORMAL LOW (ref 3.87–5.11)
RDW: 14.6 % (ref 11.5–15.5)
WBC: 10.5 10*3/uL (ref 4.0–10.5)
nRBC: 0 % (ref 0.0–0.2)

## 2021-01-25 LAB — COMPREHENSIVE METABOLIC PANEL
ALT: 10 U/L (ref 0–44)
AST: 23 U/L (ref 15–41)
Albumin: 3.1 g/dL — ABNORMAL LOW (ref 3.5–5.0)
Alkaline Phosphatase: 73 U/L (ref 38–126)
Anion gap: 8 (ref 5–15)
BUN: 10 mg/dL (ref 8–23)
CO2: 26 mmol/L (ref 22–32)
Calcium: 8.9 mg/dL (ref 8.9–10.3)
Chloride: 104 mmol/L (ref 98–111)
Creatinine, Ser: 0.69 mg/dL (ref 0.44–1.00)
GFR, Estimated: >60 mL/min (ref >60)
Glucose, Bld: 99 mg/dL (ref 70–99)
Potassium: 3.8 mmol/L (ref 3.5–5.1)
Sodium: 138 mmol/L (ref 135–145)
Total Bilirubin: 0.3 mg/dL (ref 0.3–1.2)
Total Protein: 6.2 g/dL — ABNORMAL LOW (ref 6.5–8.1)

## 2021-01-25 LAB — RESP PANEL BY RT-PCR (FLU A&B, COVID) ARPGX2
Influenza A by PCR: NEGATIVE
Influenza B by PCR: NEGATIVE
SARS Coronavirus 2 by RT PCR: NEGATIVE

## 2021-01-25 MED ORDER — FENTANYL CITRATE PF 50 MCG/ML IJ SOSY
50.0000 ug | PREFILLED_SYRINGE | Freq: Once | INTRAMUSCULAR | Status: AC
  Administered 2021-01-25: 50 ug via INTRAMUSCULAR
  Filled 2021-01-25: qty 1

## 2021-01-25 NOTE — ED Notes (Signed)
Per nurse Tillie Rung at [facility] [phone]), pt needs to go to [facility] of the building and will go to room 1100.

## 2021-01-25 NOTE — ED Provider Notes (Signed)
Results for orders placed or performed during the hospital encounter of 01/25/21  Resp Panel by RT-PCR (Flu A&B, Covid) Nasopharyngeal Swab   Specimen: Nasopharyngeal Swab; Nasopharyngeal(NP) swabs in vial transport medium  Result Value Ref Range   SARS Coronavirus 2 by RT PCR NEGATIVE NEGATIVE   Influenza A by PCR NEGATIVE NEGATIVE   Influenza B by PCR NEGATIVE NEGATIVE  CBC with Differential  Result Value Ref Range   WBC 10.5 4.0 - 10.5 K/uL   RBC 3.50 (L) 3.87 - 5.11 MIL/uL   Hemoglobin 10.2 (L) 12.0 - 15.0 g/dL   HCT 32.8 (L) 36.0 - 46.0 %   MCV 93.7 80.0 - 100.0 fL   MCH 29.1 26.0 - 34.0 pg   MCHC 31.1 30.0 - 36.0 g/dL   RDW 14.6 11.5 - 15.5 %   Platelets 305 150 - 400 K/uL   nRBC 0.0 0.0 - 0.2 %   Neutrophils Relative % 60 %   Neutro Abs 6.4 1.7 - 7.7 K/uL   Lymphocytes Relative 24 %   Lymphs Abs 2.5 0.7 - 4.0 K/uL   Monocytes Relative 10 %   Monocytes Absolute 1.1 (H) 0.1 - 1.0 K/uL   Eosinophils Relative 4 %   Eosinophils Absolute 0.4 0.0 - 0.5 K/uL   Basophils Relative 1 %   Basophils Absolute 0.1 0.0 - 0.1 K/uL   Immature Granulocytes 1 %   Abs Immature Granulocytes 0.07 0.00 - 0.07 K/uL  Comprehensive metabolic panel  Result Value Ref Range   Sodium 138 135 - 145 mmol/L   Potassium 3.8 3.5 - 5.1 mmol/L   Chloride 104 98 - 111 mmol/L   CO2 26 22 - 32 mmol/L   Glucose, Bld 99 70 - 99 mg/dL   BUN 10 8 - 23 mg/dL   Creatinine, Ser 0.69 0.44 - 1.00 mg/dL   Calcium 8.9 8.9 - 10.3 mg/dL   Total Protein 6.2 (L) 6.5 - 8.1 g/dL   Albumin 3.1 (L) 3.5 - 5.0 g/dL   AST 23 15 - 41 U/L   ALT 10 0 - 44 U/L   Alkaline Phosphatase 73 38 - 126 U/L   Total Bilirubin 0.3 0.3 - 1.2 mg/dL   GFR, Estimated >60 >60 mL/min   Anion gap 8 5 - 15   DG Chest 1 View  Result Date: 01/25/2021 CLINICAL DATA:  Status post fall EXAM: CHEST  1 VIEW COMPARISON:  07/13/2020 FINDINGS: Calcified left mid lung pulmonary nodule likely reflecting sequela prior granulomatous disease. No focal  consolidation. No pleural effusion or pneumothorax. Heart and mediastinal contours are unremarkable. No acute osseous abnormality. Old left posterior rib fractures. IMPRESSION: No active disease. Electronically Signed   By: Kathreen Devoid M.D.   On: 01/25/2021 15:59   DG Thoracic Spine 2 View  Result Date: 01/25/2021 CLINICAL DATA:  [place] EXAM: THORACIC SPINE 2 VIEWS COMPARISON:  06/30/2019 FINDINGS: Frontal and lateral views of the thoracic spine are obtained. Chronic compression deformity at T11 again noted. No acute fracture. Stable mild lower thoracic spondylosis. Paraspinal soft tissues are unremarkable. IMPRESSION: 1. No acute fracture. 2. Stable chronic T11 compression fracture. Electronically Signed   By: Randa Ngo M.D.   On: 01/25/2021 16:09   DG Lumbar Spine Complete  Result Date: 01/25/2021 CLINICAL DATA:  [place] EXAM: LUMBAR SPINE - COMPLETE 4+ VIEW COMPARISON:  06/11/2007 FINDINGS: Frontal, bilateral oblique, lateral views of the lumbar spine are obtained. There are 5 non-rib-bearing lumbar type vertebral bodies with left convex scoliosis unchanged. There  are no acute displaced fractures. There is diffuse multilevel spondylosis and facet hypertrophy. Sacroiliac joints are unremarkable. IMPRESSION: 1. Spondylosis and left convex scoliosis.  No acute fracture. Electronically Signed   By: Randa Ngo M.D.   On: 01/25/2021 16:06   DG Shoulder Right  Result Date: 01/25/2021 CLINICAL DATA:  [place] EXAM: RIGHT SHOULDER - 2+ VIEW COMPARISON:  None. FINDINGS: Frontal and transscapular views of the right shoulder are obtained. Glenohumeral and acromioclavicular joint osteoarthritis. No fracture, subluxation, or dislocation. Prior healed right rib fractures. Right chest is clear. IMPRESSION: 1. Degenerative changes.  No acute fracture. Electronically Signed   By: Randa Ngo M.D.   On: 01/25/2021 16:08   DG Knee 2 Views Left  Result Date: 01/25/2021 CLINICAL DATA:  [place] EXAM: LEFT KNEE - 1-2  VIEW COMPARISON:  01/15/2021 FINDINGS: Frontal and cross-table lateral views of the left knee are obtained. No acute fractures. The bones are osteopenic. Stable 3 compartmental osteoarthritis greatest laterally. No effusion. IMPRESSION: 1. Stable degenerative changes.  No acute fracture. Electronically Signed   By: Randa Ngo M.D.   On: 01/25/2021 15:59   DG Knee 2 Views Right  Result Date: 01/25/2021 CLINICAL DATA:  [place] EXAM: RIGHT KNEE - 1-2 VIEW COMPARISON:  None. FINDINGS: Frontal and cross-table lateral views of the right knee are obtained. No fracture, subluxation, or dislocation. Bones are osteopenic. Three compartmental osteoarthritis greatest laterally. No effusion. Soft tissues are unremarkable. IMPRESSION: 1. Osteopenia and osteoarthritis.  No acute fracture. Electronically Signed   By: Randa Ngo M.D.   On: 01/25/2021 16:04   CT Head Wo Contrast  Result Date: 01/25/2021 CLINICAL DATA:  Unwitnessed fall. EXAM: CT HEAD WITHOUT CONTRAST TECHNIQUE: Contiguous axial images were obtained from the base of the skull through the vertex without intravenous contrast. COMPARISON:  01/15/2021 FINDINGS: Brain: There is no evidence for acute hemorrhage, hydrocephalus, mass lesion, or abnormal extra-axial fluid collection. No definite CT evidence for acute infarction. Diffuse loss of parenchymal volume is consistent with atrophy. Patchy low attenuation in the deep hemispheric and periventricular white matter is nonspecific, but likely reflects chronic microvascular ischemic demyelination. Vascular: No hyperdense vessel or unexpected calcification. Skull: No evidence for fracture. No worrisome lytic or sclerotic lesion. Sinuses/Orbits: The visualized paranasal sinuses and mastoid air cells are clear. Visualized portions of the globes and intraorbital fat are unremarkable. Other: None. IMPRESSION: 1. No acute intracranial abnormality.  Stable since prior. 2. Atrophy with chronic small vessel white matter  ischemic disease. Electronically Signed   By: Misty Stanley M.D.   On: 01/25/2021 13:01   CT HEAD WO CONTRAST (5MM)  Result Date: 01/15/2021 CLINICAL DATA:  Unwitnessed fall. EXAM: CT HEAD WITHOUT CONTRAST TECHNIQUE: Contiguous axial images were obtained from the base of the skull through the vertex without intravenous contrast. COMPARISON:  Jul 14, 2020 FINDINGS: Brain: There is moderate to marked severity cerebral atrophy with widening of the extra-axial spaces and ventricular dilatation. There are areas of decreased attenuation within the white matter tracts of the supratentorial brain, consistent with microvascular disease changes. Chronic right basal ganglia lacunar infarcts are noted. Vascular: No hyperdense vessel or unexpected calcification. Skull: Normal. Negative for fracture or focal lesion. Sinuses/Orbits: No acute finding. Other: Mild to moderate severity left parietal scalp soft tissue swelling is seen. IMPRESSION: 1. Mild to moderate severity left parietal scalp soft tissue swelling without evidence of an acute fracture or acute intracranial abnormality. 2. Moderate to marked severity cerebral atrophy and microvascular disease changes of the supratentorial brain. 3.  Chronic right basal ganglia lacunar infarcts. Electronically Signed   By: Virgina Norfolk M.D.   On: 01/15/2021 03:39   CT Cervical Spine Wo Contrast  Result Date: 01/25/2021 CLINICAL DATA:  Unwitnessed fall. EXAM: CT CERVICAL SPINE WITHOUT CONTRAST TECHNIQUE: Multidetector CT imaging of the cervical spine was performed without intravenous contrast. Multiplanar CT image reconstructions were also generated. COMPARISON:  CT cervical spine 01/15/2021 FINDINGS: Alignment: Normal Skull base and vertebrae: Negative for fracture or mass in the cervical spine Healing fracture left medial clavicle unchanged from the prior study. Soft tissues and spinal canal: Atherosclerotic calcification. Negative for mass or adenopathy. Disc levels:  Multilevel disc and facet degeneration. Prominent degenerative changes C1-2 with spurring. Upper chest: Lung apices clear bilaterally. Other: None IMPRESSION: Cervical spondylosis. Negative for cervical spine fracture Subacute healing fracture left medial clavicle unchanged from 01/15/2021. Electronically Signed   By: Franchot Gallo M.D.   On: 01/25/2021 13:38   CT Cervical Spine Wo Contrast  Result Date: 01/15/2021 CLINICAL DATA:  Unwitnessed fall. EXAM: CT CERVICAL SPINE WITHOUT CONTRAST TECHNIQUE: Multidetector CT imaging of the cervical spine was performed without intravenous contrast. Multiplanar CT image reconstructions were also generated. COMPARISON:  Jul 13, 2020 FINDINGS: Alignment: Normal. Skull base and vertebrae: No acute fracture. Chronic and degenerative changes are seen involving the tip of the dens. No primary bone lesion or focal pathologic process. Soft tissues and spinal canal: No prevertebral fluid or swelling. No visible canal hematoma. Disc levels: Moderate to marked severity endplate sclerosis is seen at the levels of C2-C3, C4-C5 and C5-C6. Mild endplate sclerosis is present at the levels of C3-C4 and C6-C7. There is marked severity narrowing of the anterior atlantoaxial articulation. Marked severity intervertebral disc space narrowing is also seen at the levels of C2-C3, C4-C5 and C5-C6. Bilateral moderate to marked severity multilevel facet joint hypertrophy is noted. Upper chest: An acute, comminuted fracture deformity is seen involving the proximal aspect of the left clavicle Other: None. IMPRESSION: 1. Acute, comminuted fracture deformity of the proximal left clavicle. 2. Marked severity multilevel degenerative changes, as described above. 3. No acute cervical spine fracture. Electronically Signed   By: Virgina Norfolk M.D.   On: 01/15/2021 03:55   DG Shoulder Left  Result Date: 01/25/2021 CLINICAL DATA:  [place] EXAM: LEFT SHOULDER - 2+ VIEW COMPARISON:  01/15/2021 FINDINGS:  Frontal and transscapular views of the left shoulder are obtained. Stable glenohumeral osteoarthritis. No fracture, subluxation, or dislocation. Stable prior healed left rib fractures. IMPRESSION: 1. Stable osteoarthritis.  No acute fracture. Electronically Signed   By: Randa Ngo M.D.   On: 01/25/2021 16:06   DG Shoulder Left  Result Date: 01/15/2021 CLINICAL DATA:  Fall.  Shoulder laceration. EXAM: LEFT SHOULDER - 2+ VIEW COMPARISON:  03/12/2018. FINDINGS: No evidence for an acute fracture. No shoulder dislocation. Bones are diffusely demineralized. Old left rib fractures evident. IMPRESSION: Negative. Electronically Signed   By: Misty Stanley M.D.   On: 01/15/2021 06:28   DG Knee Complete 4 Views Left  Result Date: 01/15/2021 CLINICAL DATA:  Unwitnessed fall. EXAM: LEFT KNEE - COMPLETE 4+ VIEW COMPARISON:  March 12, 2018 FINDINGS: No evidence of an acute fracture, dislocation, or joint effusion. Moderate severity lateral marginal osteophytes are seen. Marked severity lateral tibiofemoral and patellofemoral compartment space narrowing is seen. Moderate severity medial tibiofemoral compartment space narrowing is also noted. Mild to moderate severity medial and lateral chondrocalcinosis is present. There is mild vascular calcification. IMPRESSION: 1. No acute osseous abnormality. 2. Severe tricompartmental degenerative  changes. Electronically Signed   By: Virgina Norfolk M.D.   On: 01/15/2021 03:36   DG Humerus Left  Result Date: 01/15/2021 CLINICAL DATA:  87 year old female status post fall at nursing home. EXAM: LEFT HUMERUS - 2+ VIEW COMPARISON:  Left shoulder series today reported separately. 03/12/2018. FINDINGS: Grossly preserved alignment at the left shoulder and elbow. No left humerus fracture identified. There are numerous displaced but chronic appearing left lateral rib fractures. Superimposed calcified granuloma in the left mid lung. IMPRESSION: No acute fracture or dislocation  identified about the left humerus. Electronically Signed   By: Genevie Ann M.D.   On: 01/15/2021 06:32   DG Hip Unilat W or Wo Pelvis 2-3 Views Left  Result Date: 01/25/2021 CLINICAL DATA:  [place] EXAM: DG HIP (WITH OR WITHOUT PELVIS) 2-3V LEFT COMPARISON:  03/12/2018 FINDINGS: Frontal view of the pelvis as well as frontal and cross-table lateral views of the left hip are obtained. Right hip and knee arthroplasty is in the expected position. Stable mild left hip osteoarthritis. No fracture, subluxation, or dislocation. The remainder of the bony pelvis is unremarkable. IMPRESSION: 1. No acute displaced fracture. Electronically Signed   By: Randa Ngo M.D.   On: 01/25/2021 16:01   DG Hip Unilat W or Wo Pelvis 2-3 Views Right  Result Date: 01/15/2021 CLINICAL DATA:  88 year old female status post fall at nursing home this morning. EXAM: DG HIP (WITH OR WITHOUT PELVIS) 2-3V RIGHT COMPARISON:  Intraoperative right hip images 07/13/2020. CT Abdomen and Pelvis 06/30/2019. FINDINGS: Arthroplasty of the proximal right femur. Hardware appears intact and normally aligned. No pelvis fracture identified. Left femoral head normally located. Partially visible scoliosis. No acute osseous abnormality identified. Curvilinear presumed external artifact projecting over the right lower quadrant, not identified on the CT Abdomen and Pelvis last year. IMPRESSION: 1. Intact right hip arthroplasty. No acute fracture or dislocation identified. 2. Curvilinear wire-like object projects over the right lower quadrant, query external artifact Electronically Signed   By: Genevie Ann M.D.   On: 01/15/2021 06:30     Patient care was taken over from Dr. Billy Fischer.  Patient has a history of dementia and had an unwitnessed fall.  She had soreness all over.  According to her daughter, she has had 3 recent falls.  She is nonambulatory but tries to get out of bed and then falls [place] to her daughter.  She has some old appearing bruising and some  new areas of bruising.  She had multiple x-rays which showed no evidence of acute fractures.  She had a CT scan of her head and cervical spine which showed no evidence of acute injuries.  She had an x-ray of her left hip that showed no evidence of acute fracture.  Her right hip is status post arthroplasty.  On reexam, she is tender all over.  She does not have any increased tenderness to the left hip as compared to her right hip.  She does not have any shortening or rotation of the leg.  Her labs are nonconcerning.  She has some mild anemia but it looks improved from her last values.  I have a low suspicion of an occult fracture.  I updated her daughter.  She was discharged back to her nursing facility.   Malvin Johns, MD 01/25/21 [phone]

## 2021-01-25 NOTE — ED Triage Notes (Signed)
Pt brought to ED via [facility] from [facility] for unwitnessed fall, no thinners. Staff found patient in floor around 10:45AM. Patient alert to self, disoriented to time and place, states she remembers falling but does not know how long she was in the floor. Hematoma present to right head, skin tear on right arm. Patient endorses pain in head 9/10 when touched. EMS states patient would not remain still enough for c-collar to be placed.   EMS v/s: 108/68 60 HR 147 CBG

## 2021-01-25 NOTE — ED Notes (Signed)
Patient transported to x-ray. ?

## 2021-01-25 NOTE — ED Notes (Signed)
Cleaned skin tear on right upper arm and applied steri strips to hold skin flap in place. Covered with telfa dressing and wrapped in rolled gauze, then applied ace wrap to secure. Pt tolerated well with minimal bleeding throughout wound care procedure.

## 2021-01-25 NOTE — ED Notes (Signed)
Repositioned pt and provided water, crackers, and cheese for snack. Pt is eating and drinking without difficulty. Both side rails are up for pt safety due to high fall risk.

## 2021-01-25 NOTE — ED Notes (Signed)
Cleaster Corin daughter [phone] requesting an update on the patient

## 2021-01-25 NOTE — ED Notes (Signed)
Called [facility] at [phone] to arrange transport back to [facility].

## 2021-01-25 NOTE — ED Provider Notes (Signed)
[facility] EMERGENCY DEPARTMENT Provider Note   CSN: [id number] Arrival date & time: 01/25/21  1131     History Chief Complaint  Patient presents with   [name] is a 87 y.o. female.  HPI     87 year old female with a history of coronary artery disease, chronic atrial fibrillation, coronary artery disease, dementia, hypertension, hyperlipidemia, pulmonary hypertension,, who presents with concern for unwitnessed fall.  Staff found the patient on the floor at home around 10:45 AM.  History is limited by patient's dementia.  Attempted to call [facility] but got voicemail.  She is oriented to self and location but not time.  Initially she reports that somebody was helping her walk and she fell, but then reports that no one saw her.  Denies any loss of consciousness.  Is not clear if she is a reliable historian given her history of dementia.  She reports pain all over.  Is not able to localize 1 area.  When asked if her back hurts she says yes.  Reports she has headache.  Reports hip pain and shoulder pain.  Past Medical History:  Diagnosis Date   Ankle fracture    Anxiety    Arthritis    Barrett's esophagus    Chronic atrial fibrillation ([place])    a. Pt previously declined DCCV.   Coronary artery disease    a. s/p Cypher DES to LAD in 2006;  b. Last LHC 02/2008:  pLAD 30%, mLAD stent patent, pOM 50%, EF 65%;  c. Lexiscan Myoview 7/14:  Intermediate risk, dist ant and inf-lat ischemia, EF 66% - patient declined cath and elected medical therapy.   Dementia ([place])    Depression 2020   Dysrhythmia    GERD (gastroesophageal reflux disease)    Hiatal hernia    Hx of echocardiogram    Echocardiogram 08/17/12: EF 55-60%, MAC, mild MR, mild LAE, mild RVE, mild to moderate RAE, moderate TR, mildly increased pulmonary artery systolic pressures   Hyperlipidemia    Hypertension    Mitral regurgitation    a. Mod by echo 10/2014.   Obesity    Pulmonary  hypertension ([place])    a. Mod-severely elevated PA pressures by echo 10/2014.   QT prolongation    a. During 10/2014 admit, QTC 580m.   Stroke ([place] 12/18/2017   Tricuspid regurgitation    a. Mod by echo 10/2014.   UTI (lower urinary tract infection) 03/2015    Patient Active Problem List   Diagnosis Date Noted   [place] right [place] fracture [place] 07/13/2020   Scalp laceration 07/13/2020   DNR (do not resuscitate) 07/13/2020   Fall 07/13/2020   Pneumothorax on left 06/30/2019   Lewy body dementia ([place] 05/18/2019   Recurrent UTI--E coli/Pseudomonas 01/18/2018   Orthostasis 01/18/2018   Benign essential HTN    Cerebral edema (HCC)    Hypomagnesemia    Dysphagia, post-stroke    Lethargy    ABLA (acute blood loss anemia)    Hypoalbuminemia due to protein-calorie malnutrition ([place]    History of Barrett's esophagus    Urinary retention    Seizure prophylaxis    E. coli UTI    Intraventricular hemorrhage (HCC)    Urinary tract infection without hematuria    Coronary artery disease involving native coronary artery of native heart without angina pectoris    Pulmonary HTN ([place]    Leukocytosis    Intracranial hemorrhage ([place] 12/18/2017   Syncope 04/10/2015   Sepsis  secondary to UTI ([place]) 04/10/2015   URI (upper respiratory infection) 04/10/2015   UTI (lower urinary tract infection) 04/10/2015   QT prolongation 10/12/2014   Headache 09/02/2012   Tricuspid valve regurgitation, moderate 08/18/2012   Hypokalemia 08/16/2012   Hyponatremia 08/16/2012   Atrial fibrillation ([place])    Hypertension    Anxiety    Mixed hyperlipidemia 09/11/2009   GERD 08/30/2008   CHEST PAIN 08/30/2008   Coronary atherosclerosis 04/04/2008   Syncope and collapse 04/04/2008    Past Surgical History:  Procedure Laterality Date   ABDOMINAL HYSTERECTOMY     ANKLE SURGERY  2001   BREAST EXCISIONAL BIOPSY Right 1970   BREAST EXCISIONAL BIOPSY Right    BREAST EXCISIONAL BIOPSY Left    BREAST SURGERY   1970   CESAREAN SECTION     CORONARY ANGIOPLASTY     ESOPHAGOGASTRODUODENOSCOPY     HIP ARTHROPLASTY Right 07/13/2020   Procedure: ARTHROPLASTY BIPOLAR HIP (HEMIARTHROPLASTY);  Surgeon: Rod Can, MD;  Location: [place];  Service: Orthopedics;  Laterality: Right;     OB History   No obstetric history on file.     Family History  Problem Relation Age of Onset   Diabetes Mother    Heart attack Mother    Lung cancer Father    Thyroid disease Sister    Thyroid disease Sister    Hypertension Sister    Hypertension Sister    Hypertension Sister    Hypertension Sister    Hypertension Brother    Hypertension Brother    Breast cancer Other 62   Colon cancer Neg Hx    Colon polyps Neg Hx    Esophageal cancer Neg Hx    Stroke Neg Hx     Social History   Tobacco Use   Smoking status: Former    Types: Cigarettes   Smokeless tobacco: Never  Vaping Use   Vaping Use: Never used  Substance Use Topics   Alcohol use: No    Alcohol/week: 0.0 standard drinks   Drug use: No    Home Medications Prior to Admission medications   Medication Sig Start Date End Date Taking? Authorizing Provider  acetaminophen (TYLENOL) 325 MG tablet Take 650 mg by mouth 2 (two) times daily.    [provider]  baclofen (LIORESAL) 5 mg TABS tablet Take 5 mg by mouth 2 (two) times daily.    [provider]  collagenase (SANTYL) ointment Apply 1 application topically daily. Apply to sacrum ulcer daily    [provider]  docusate sodium (COLACE) 100 MG capsule Take 1 capsule (100 mg total) by mouth 2 (two) times daily. Patient taking differently: Take 200 mg by mouth 2 (two) times daily. 01/18/18   Love, Ivan Anchors, PA-C  famotidine (PEPCID) 40 MG tablet Take 40 mg by mouth daily.    [provider]  ferrous sulfate 325 (65 FE) MG tablet Take 325 mg by mouth daily with breakfast.    [provider]  irbesartan (AVAPRO) 75 MG tablet Take 75 mg by mouth daily.     [provider]  levETIRAcetam (KEPPRA) 500 MG tablet Take 1 tablet (500 mg total) by mouth 2 (two) times daily. 12/23/17   Costella, Vista Mink, PA-C  magnesium oxide (MAG-OX) 400 MG tablet Take 400 mg by mouth daily.    [provider]  Melatonin 10 MG TABS Take 10 mg by mouth at bedtime.    [provider]  metoprolol tartrate (LOPRESSOR) 25 MG tablet Take 25  mg by mouth daily.    [provider]  Multiple Vitamin (MULTIVITAMIN WITH MINERALS) TABS tablet Take 1 tablet by mouth daily. 01/19/18   Love, Ivan Anchors, PA-C  nitroGLYCERIN (NITROSTAT) 0.4 MG SL tablet Place 1 tablet (0.4 mg total) under the tongue every 5 (five) minutes as needed for chest pain (3 doses only). 01/18/18   Love, Ivan Anchors, PA-C  Nutritional Supplements (NUTRITIONAL SUPPLEMENT PO) Take 120 mLs by mouth 3 (three) times daily between meals.    [provider]  nystatin (MYCOSTATIN/NYSTOP) powder Apply 1 application topically daily.    [provider]  nystatin ointment (MYCOSTATIN) Apply 1 application topically daily. Apply to satellite rash to resident lower back    [provider]  omeprazole (PRILOSEC OTC) 20 MG tablet Take 20 mg by mouth daily.    [provider]  polyethylene glycol (MIRALAX / GLYCOLAX) packet Take 17 g by mouth daily. 01/19/18   Love, Ivan Anchors, PA-C  sertraline (ZOLOFT) 50 MG tablet Take 50 mg by mouth daily.    [provider]  Skin Protectants, Misc. (EUCERIN) cream Apply 1 application topically 2 (two) times daily. Apply thin layer to the rash on her back    [provider]    Allergies    Amlodipine, Methyldopa, Shellfish-derived products, Tape, Actonel [risedronate sodium], Bactrim [sulfamethoxazole-trimethoprim], Ciprofloxacin hcl, Gas-x [simethicone], Hydralazine hcl, Lactose intolerance (gi), Lisinopril, Milk-related compounds, Other, Oxycodone hcl, Pneumococcal vaccines, Valsartan, Cozaar [losartan], Digitek  [digoxin], Felodipine, Hctz [hydrochlorothiazide], Simvastatin, and Spironolactone  Review of Systems   Review of Systems  Unable to perform ROS: Dementia  Musculoskeletal:  Positive for arthralgias.  Neurological:  Positive for headaches.   Physical Exam Updated Vital Signs BP (!) 149/48 (BP Location: Left Arm)   Pulse (!) 52   Temp 97.8 F (36.6 C)   Resp 14   SpO2 97%   Physical Exam Vitals and nursing note reviewed.  Constitutional:      General: She is not in acute distress.    Appearance: She is well-developed. She is not diaphoretic.  HENT:     Head: Normocephalic and atraumatic.  Eyes:     Conjunctiva/sclera: Conjunctivae normal.  Cardiovascular:     Rate and Rhythm: Normal rate and regular rhythm.     Heart sounds: Normal heart sounds. No murmur heard.   No friction rub. No gallop.  Pulmonary:     Effort: Pulmonary effort is normal. No respiratory distress.     Breath sounds: Normal breath sounds. No wheezing or rales.  Abdominal:     General: There is no distension.     Palpations: Abdomen is soft.     Tenderness: There is no abdominal tenderness. There is no guarding.  Musculoskeletal:        General: Tenderness (movement lle, shortened lle) present.     Cervical back: Normal range of motion.  Skin:    General: Skin is warm and dry.     Findings: No erythema or rash.  Neurological:     Mental Status: She is alert and oriented to person, place, and time.    ED Results / Procedures / Treatments   Labs (all labs ordered are listed, but only abnormal results are displayed) Labs Reviewed  RESP PANEL BY RT-PCR (FLU A&B, COVID) [id number]  CBC WITH DIFFERENTIAL/PLATELET  COMPREHENSIVE METABOLIC PANEL    EKG None  Radiology DG Chest 1 View  Result Date: 01/25/2021 CLINICAL DATA:  Status post fall EXAM: CHEST  1 VIEW COMPARISON:  07/13/2020 FINDINGS: Calcified left mid lung pulmonary nodule likely reflecting sequela prior granulomatous disease. No focal  consolidation. No pleural effusion or pneumothorax. Heart and mediastinal contours are unremarkable. No acute osseous abnormality. Old left posterior rib fractures. IMPRESSION: No active disease. Electronically Signed   By: Kathreen Devoid M.D.   On: 01/25/2021 15:59   DG Thoracic Spine 2 View  Result Date: 01/25/2021 CLINICAL DATA:  [place] EXAM: THORACIC SPINE 2 VIEWS COMPARISON:  06/30/2019 FINDINGS: Frontal and lateral views of the thoracic spine are obtained. Chronic compression deformity at T11 again noted. No acute fracture. Stable mild lower thoracic spondylosis. Paraspinal soft tissues are unremarkable. IMPRESSION: 1. No acute fracture. 2. Stable chronic T11 compression fracture. Electronically Signed   By: Randa Ngo M.D.   On: 01/25/2021 16:09   DG Lumbar Spine Complete  Result Date: 01/25/2021 CLINICAL DATA:  [place] EXAM: LUMBAR SPINE - COMPLETE 4+ VIEW COMPARISON:  06/11/2007 FINDINGS: Frontal, bilateral oblique, lateral views of the lumbar spine are obtained. There are 5 non-rib-bearing lumbar type vertebral bodies with left convex scoliosis unchanged. There are no acute displaced fractures. There is diffuse multilevel spondylosis and facet hypertrophy. Sacroiliac joints are unremarkable. IMPRESSION: 1. Spondylosis and left convex scoliosis.  No acute fracture. Electronically Signed   By: Randa Ngo M.D.   On: 01/25/2021 16:06   DG Shoulder Right  Result Date: 01/25/2021 CLINICAL DATA:  [place] EXAM: RIGHT SHOULDER - 2+ VIEW COMPARISON:  None. FINDINGS: Frontal and transscapular views of the right shoulder are obtained. Glenohumeral and acromioclavicular joint osteoarthritis. No fracture, subluxation, or dislocation. Prior healed right rib fractures. Right chest is clear. IMPRESSION: 1. Degenerative changes.  No acute fracture. Electronically Signed   By: Randa Ngo M.D.   On: 01/25/2021 16:08   DG Knee 2 Views Left  Result Date: 01/25/2021 CLINICAL DATA:  [place] EXAM: LEFT KNEE - 1-2  VIEW COMPARISON:  01/15/2021 FINDINGS: Frontal and cross-table lateral views of the left knee are obtained. No acute fractures. The bones are osteopenic. Stable 3 compartmental osteoarthritis greatest laterally. No effusion. IMPRESSION: 1. Stable degenerative changes.  No acute fracture. Electronically Signed   By: Randa Ngo M.D.   On: 01/25/2021 15:59   DG Knee 2 Views Right  Result Date: 01/25/2021 CLINICAL DATA:  [place] EXAM: RIGHT KNEE - 1-2 VIEW COMPARISON:  None. FINDINGS: Frontal and cross-table lateral views of the right knee are obtained. No fracture, subluxation, or dislocation. Bones are osteopenic. Three compartmental osteoarthritis greatest laterally. No effusion. Soft tissues are unremarkable. IMPRESSION: 1. Osteopenia and osteoarthritis.  No acute fracture. Electronically Signed   By: Randa Ngo M.D.   On: 01/25/2021 16:04   CT Head Wo Contrast  Result Date: 01/25/2021 CLINICAL DATA:  Unwitnessed fall. EXAM: CT HEAD WITHOUT CONTRAST TECHNIQUE: Contiguous axial images were obtained from the base of the skull through the vertex without intravenous contrast. COMPARISON:  01/15/2021 FINDINGS: Brain: There is no evidence for acute hemorrhage, hydrocephalus, mass lesion, or abnormal extra-axial fluid collection. No definite CT evidence for acute infarction. Diffuse loss of parenchymal volume is consistent with atrophy. Patchy low attenuation in the deep hemispheric and periventricular white matter is nonspecific, but likely reflects chronic microvascular ischemic demyelination. Vascular: No hyperdense vessel or unexpected calcification. Skull: No evidence for fracture. No worrisome lytic or sclerotic lesion. Sinuses/Orbits: The visualized paranasal sinuses and mastoid air cells are clear. Visualized portions of the globes and intraorbital fat are unremarkable. Other: None. IMPRESSION: 1. No acute intracranial abnormality.  Stable since prior. 2.  Atrophy with chronic small vessel white matter  ischemic disease. Electronically Signed   By: Misty Stanley M.D.   On: 01/25/2021 13:01   CT Cervical Spine Wo Contrast  Result Date: 01/25/2021 CLINICAL DATA:  Unwitnessed fall. EXAM: CT CERVICAL SPINE WITHOUT CONTRAST TECHNIQUE: Multidetector CT imaging of the cervical spine was performed without intravenous contrast. Multiplanar CT image reconstructions were also generated. COMPARISON:  CT cervical spine 01/15/2021 FINDINGS: Alignment: Normal Skull base and vertebrae: Negative for fracture or mass in the cervical spine Healing fracture left medial clavicle unchanged from the prior study. Soft tissues and spinal canal: Atherosclerotic calcification. Negative for mass or adenopathy. Disc levels: Multilevel disc and facet degeneration. Prominent degenerative changes C1-2 with spurring. Upper chest: Lung apices clear bilaterally. Other: None IMPRESSION: Cervical spondylosis. Negative for cervical spine fracture Subacute healing fracture left medial clavicle unchanged from 01/15/2021. Electronically Signed   By: Franchot Gallo M.D.   On: 01/25/2021 13:38   DG Shoulder Left  Result Date: 01/25/2021 CLINICAL DATA:  [place] EXAM: LEFT SHOULDER - 2+ VIEW COMPARISON:  01/15/2021 FINDINGS: Frontal and transscapular views of the left shoulder are obtained. Stable glenohumeral osteoarthritis. No fracture, subluxation, or dislocation. Stable prior healed left rib fractures. IMPRESSION: 1. Stable osteoarthritis.  No acute fracture. Electronically Signed   By: Randa Ngo M.D.   On: 01/25/2021 16:06   DG Hip Unilat W or Wo Pelvis 2-3 Views Left  Result Date: 01/25/2021 CLINICAL DATA:  [place] EXAM: DG HIP (WITH OR WITHOUT PELVIS) 2-3V LEFT COMPARISON:  03/12/2018 FINDINGS: Frontal view of the pelvis as well as frontal and cross-table lateral views of the left hip are obtained. Right hip and knee arthroplasty is in the expected position. Stable mild left hip osteoarthritis. No fracture, subluxation, or dislocation. The  remainder of the bony pelvis is unremarkable. IMPRESSION: 1. No acute displaced fracture. Electronically Signed   By: Randa Ngo M.D.   On: 01/25/2021 16:01    Procedures Procedures   Medications Ordered in ED Medications  fentaNYL (SUBLIMAZE) injection 50 mcg (50 mcg Intramuscular Given 01/25/21 1405)    ED Course  I have reviewed the triage vital signs and the nursing notes.  Pertinent labs & imaging results that were available during my care of the patient were reviewed by me and considered in my medical decision making (see chart for details).    MDM Rules/Calculators/A&P                           87 year old female with a history of chronic atrial fibrillation, pulmonary disease, dementia, hypertension, hyperlipidemia, pulmonary hypertension, hip fracture in May, Lewy body dementia, prior intracranial hemorrhage, who presents with concern for unwitnessed fall.  CT head and CSpine without acute findings. XR obtained of back t/l, bilateral hips, knees, shoulder given areas of pain. History limited by dementia. Plan to update daughter when we have more information. Labs and imaging pending at time of transfer of care.   Final Clinical Impression(s) / ED Diagnoses Final diagnoses:  Fall  Unwitnessed fall  Left hip pain  Arthralgia, unspecified joint    Rx / DC Orders ED Discharge Orders     None        Gareth Morgan, MD 01/25/21 [phone]

## 2021-01-26 ENCOUNTER — Emergency Department (HOSPITAL_COMMUNITY)
Admission: EM | Admit: 2021-01-26 | Discharge: 2021-01-27 | Disposition: A | Discharge status: Home / Self Care | Payer: Medicare Other | Source: Home / Self Care | Attending: Emergency Medicine | Admitting: Emergency Medicine

## 2021-01-26 ENCOUNTER — Emergency Department (HOSPITAL_COMMUNITY): Payer: Medicare Other

## 2021-01-26 DIAGNOSIS — S41111A Laceration without foreign body of right upper arm, initial encounter: Secondary | ICD-10-CM | POA: Insufficient documentation

## 2021-01-26 DIAGNOSIS — T148XXA Other injury of unspecified body region, initial encounter: Secondary | ICD-10-CM

## 2021-01-26 DIAGNOSIS — R58 Hemorrhage, not elsewhere classified: Secondary | ICD-10-CM

## 2021-01-26 DIAGNOSIS — Z79899 Other long term (current) drug therapy: Secondary | ICD-10-CM | POA: Insufficient documentation

## 2021-01-26 DIAGNOSIS — S0083XA Contusion of other part of head, initial encounter: Secondary | ICD-10-CM | POA: Insufficient documentation

## 2021-01-26 DIAGNOSIS — M25552 Pain in left hip: Secondary | ICD-10-CM | POA: Diagnosis not present

## 2021-01-26 DIAGNOSIS — F0390 Unspecified dementia without behavioral disturbance: Secondary | ICD-10-CM | POA: Insufficient documentation

## 2021-01-26 DIAGNOSIS — S60511A Abrasion of right hand, initial encounter: Secondary | ICD-10-CM | POA: Insufficient documentation

## 2021-01-26 DIAGNOSIS — W1839XA Other fall on same level, initial encounter: Secondary | ICD-10-CM | POA: Insufficient documentation

## 2021-01-26 DIAGNOSIS — I10 Essential (primary) hypertension: Secondary | ICD-10-CM | POA: Insufficient documentation

## 2021-01-26 DIAGNOSIS — I2510 Atherosclerotic heart disease of native coronary artery without angina pectoris: Secondary | ICD-10-CM | POA: Insufficient documentation

## 2021-01-26 DIAGNOSIS — Z87891 Personal history of nicotine dependence: Secondary | ICD-10-CM | POA: Insufficient documentation

## 2021-01-26 DIAGNOSIS — S40012A Contusion of left shoulder, initial encounter: Secondary | ICD-10-CM | POA: Insufficient documentation

## 2021-01-26 DIAGNOSIS — S81012A Laceration without foreign body, left knee, initial encounter: Secondary | ICD-10-CM | POA: Insufficient documentation

## 2021-01-26 DIAGNOSIS — Z96641 Presence of right artificial hip joint: Secondary | ICD-10-CM | POA: Insufficient documentation

## 2021-01-26 DIAGNOSIS — S0990XA Unspecified injury of head, initial encounter: Secondary | ICD-10-CM

## 2021-01-26 DIAGNOSIS — W19XXXA Unspecified fall, initial encounter: Secondary | ICD-10-CM

## 2021-01-26 LAB — CBG MONITORING, ED: Glucose-Capillary: 131 mg/dL — ABNORMAL HIGH (ref 70–99)

## 2021-01-26 MED ORDER — BACLOFEN 5 MG HALF TABLET
5.0000 mg | ORAL_TABLET | Freq: Once | ORAL | Status: AC
  Administered 2021-01-26: 5 mg via ORAL
  Filled 2021-01-26 (×2): qty 1

## 2021-01-26 MED ORDER — LEVETIRACETAM 500 MG PO TABS
500.0000 mg | ORAL_TABLET | Freq: Once | ORAL | Status: AC
  Administered 2021-01-26: 500 mg via ORAL
  Filled 2021-01-26: qty 1

## 2021-01-26 MED ORDER — ACETAMINOPHEN 500 MG PO TABS
1000.0000 mg | ORAL_TABLET | Freq: Once | ORAL | Status: AC
  Administered 2021-01-26: 1000 mg via ORAL
  Filled 2021-01-26: qty 2

## 2021-01-26 MED ORDER — LIDOCAINE-EPINEPHRINE (PF) 2 %-1:200000 IJ SOLN
INTRAMUSCULAR | Status: AC
  Filled 2021-01-26: qty 20

## 2021-01-26 NOTE — ED Notes (Signed)
Patient transported to CT 

## 2021-01-26 NOTE — ED Notes (Signed)
Performed peri care and cleaned pt using foam skin cleanser. Applied moisture barrier cream, new Allevyn sacral dressing, new brief, and changed top and bottom sheets and linens. New incontinence pad in place. Existing bandage on right shin peeled loose and an existing skin tear reopened with minimal bleeding noted; wound was cleaned and dressing was reaffixed in place.

## 2021-01-26 NOTE — Discharge Instructions (Signed)
X-rays of the right upper arm, left upper arm, pelvis were negative today.  CT of the head and neck were negative.

## 2021-01-26 NOTE — ED Notes (Signed)
[facility] was called, 12 on list 

## 2021-01-26 NOTE — ED Notes (Signed)
[facility] here to get patient

## 2021-01-26 NOTE — ED Provider Notes (Signed)
[facility] EMERGENCY DEPARTMENT Provider Note   CSN: [id number] Arrival date & time: 01/26/21  1121     History No chief complaint on file.   [name] is a 87 y.o. female.  Patient with history of A. fib, multiple falls, dementia --presents to the emergency department after having another fall today.  Patient was seen in the emergency department yesterday, had negative imaging and reassuring labs.  She was found down around 10 AM by staff at [facility].  She had a skin tear to the right upper extremity, left parietal hematoma.  Level 5 caveat due to dementia.      Past Medical History:  Diagnosis Date   Ankle fracture    Anxiety    Arthritis    Barrett's esophagus    Chronic atrial fibrillation ([place])    a. Pt previously declined DCCV.   Coronary artery disease    a. s/p Cypher DES to LAD in 2006;  b. Last LHC 02/2008:  pLAD 30%, mLAD stent patent, pOM 50%, EF 65%;  c. Lexiscan Myoview 7/14:  Intermediate risk, dist ant and inf-lat ischemia, EF 66% - patient declined cath and elected medical therapy.   Dementia ([place])    Depression 2020   Dysrhythmia    GERD (gastroesophageal reflux disease)    Hiatal hernia    Hx of echocardiogram    Echocardiogram 08/17/12: EF 55-60%, MAC, mild MR, mild LAE, mild RVE, mild to moderate RAE, moderate TR, mildly increased pulmonary artery systolic pressures   Hyperlipidemia    Hypertension    Mitral regurgitation    a. Mod by echo 10/2014.   Obesity    Pulmonary hypertension ([place])    a. Mod-severely elevated PA pressures by echo 10/2014.   QT prolongation    a. During 10/2014 admit, QTC 544m.   Stroke ([place] 12/18/2017   Tricuspid regurgitation    a. Mod by echo 10/2014.   UTI (lower urinary tract infection) 03/2015    Patient Active Problem List   Diagnosis Date Noted   Closed right hip fracture ([place] 07/13/2020   Scalp laceration 07/13/2020   DNR (do not resuscitate) 07/13/2020   Fall 07/13/2020    Pneumothorax on left 06/30/2019   Lewy body dementia ([place] 05/18/2019   Recurrent UTI--E coli/Pseudomonas 01/18/2018   Orthostasis 01/18/2018   Benign essential HTN    Cerebral edema (HCC)    Hypomagnesemia    Dysphagia, post-stroke    Lethargy    ABLA (acute blood loss anemia)    Hypoalbuminemia due to protein-calorie malnutrition ([place]    History of Barrett's esophagus    Urinary retention    Seizure prophylaxis    E. coli UTI    Intraventricular hemorrhage (HCC)    Urinary tract infection without hematuria    Coronary artery disease involving native coronary artery of native heart without angina pectoris    Pulmonary HTN (HCC)    Leukocytosis    Intracranial hemorrhage ([place] 12/18/2017   Syncope 04/10/2015   Sepsis secondary to UTI ([place] 04/10/2015   URI (upper respiratory infection) 04/10/2015   UTI (lower urinary tract infection) 04/10/2015   QT prolongation 10/12/2014   Headache 09/02/2012   Tricuspid valve regurgitation, moderate 08/18/2012   Hypokalemia 08/16/2012   Hyponatremia 08/16/2012   Atrial fibrillation ([place]    Hypertension    Anxiety    Mixed hyperlipidemia 09/11/2009   GERD 08/30/2008   CHEST PAIN 08/30/2008   Coronary atherosclerosis 04/04/2008   Syncope and collapse 04/04/2008  Past Surgical History:  Procedure Laterality Date   ABDOMINAL HYSTERECTOMY     ANKLE SURGERY  2001   BREAST EXCISIONAL BIOPSY Right 1970   BREAST EXCISIONAL BIOPSY Right    BREAST EXCISIONAL BIOPSY Left    BREAST SURGERY  1970   CESAREAN SECTION     CORONARY ANGIOPLASTY     ESOPHAGOGASTRODUODENOSCOPY     HIP ARTHROPLASTY Right 07/13/2020   Procedure: ARTHROPLASTY BIPOLAR HIP (HEMIARTHROPLASTY);  Surgeon: Rod Can, MD;  Location: [facility];  Service: Orthopedics;  Laterality: Right;     OB History   No obstetric history on file.     Family History  Problem Relation Age of Onset   Diabetes Mother    Heart attack Mother    Lung cancer Father    Thyroid  disease Sister    Thyroid disease Sister    Hypertension Sister    Hypertension Sister    Hypertension Sister    Hypertension Sister    Hypertension Brother    Hypertension Brother    Breast cancer Other 44   Colon cancer Neg Hx    Colon polyps Neg Hx    Esophageal cancer Neg Hx    Stroke Neg Hx     Social History   Tobacco Use   Smoking status: Former    Types: Cigarettes   Smokeless tobacco: Never  Vaping Use   Vaping Use: Never used  Substance Use Topics   Alcohol use: No    Alcohol/week: 0.0 standard drinks   Drug use: No    Home Medications Prior to Admission medications   Medication Sig Start Date End Date Taking? Authorizing Provider  acetaminophen (TYLENOL) 325 MG tablet Take 650 mg by mouth 2 (two) times daily.    [provider]  baclofen (LIORESAL) 5 mg TABS tablet Take 5 mg by mouth 2 (two) times daily.    [provider]  collagenase (SANTYL) ointment Apply 1 application topically daily. Apply to sacrum ulcer daily    [provider]  docusate sodium (COLACE) 100 MG capsule Take 1 capsule (100 mg total) by mouth 2 (two) times daily. Patient taking differently: Take 200 mg by mouth 2 (two) times daily. 01/18/18   Love, Ivan Anchors, PA-C  famotidine (PEPCID) 40 MG tablet Take 40 mg by mouth daily.    [provider]  ferrous sulfate 325 (65 FE) MG tablet Take 325 mg by mouth daily with breakfast.    [provider]  irbesartan (AVAPRO) 75 MG tablet Take 75 mg by mouth daily.    [provider]  levETIRAcetam (KEPPRA) 500 MG tablet Take 1 tablet (500 mg total) by mouth 2 (two) times daily. 12/23/17   Costella, Vista Mink, PA-C  magnesium oxide (MAG-OX) 400 MG tablet Take 400 mg by mouth daily.    [provider]  Melatonin 10 MG TABS Take 10 mg by mouth at bedtime.    [provider]  metoprolol tartrate (LOPRESSOR) 25 MG tablet Take 25 mg by mouth daily.    [provider]  Multiple  Vitamin (MULTIVITAMIN WITH MINERALS) TABS tablet Take 1 tablet by mouth daily. 01/19/18   Love, Ivan Anchors, PA-C  nitroGLYCERIN (NITROSTAT) 0.4 MG SL tablet Place 1 tablet (0.4 mg total) under the tongue every 5 (five) minutes as needed for chest pain (3 doses only). 01/18/18   Love, Ivan Anchors, PA-C  Nutritional Supplements (NUTRITIONAL SUPPLEMENT PO) Take 120 mLs by mouth 3 (three) times daily between meals.  [provider]  nystatin (MYCOSTATIN/NYSTOP) powder Apply 1 application topically daily.    [provider]  nystatin ointment (MYCOSTATIN) Apply 1 application topically daily. Apply to satellite rash to resident lower back    [provider]  omeprazole (PRILOSEC OTC) 20 MG tablet Take 20 mg by mouth daily.    [provider]  polyethylene glycol (MIRALAX / GLYCOLAX) packet Take 17 g by mouth daily. 01/19/18   Love, Ivan Anchors, PA-C  sertraline (ZOLOFT) 50 MG tablet Take 50 mg by mouth daily.    [provider]  Skin Protectants, Misc. (EUCERIN) cream Apply 1 application topically 2 (two) times daily. Apply thin layer to the rash on her back    [provider]    Allergies    Amlodipine, Methyldopa, Shellfish-derived products, Tape, Actonel [risedronate sodium], Bactrim [sulfamethoxazole-trimethoprim], Ciprofloxacin hcl, Gas-x [simethicone], Hydralazine hcl, Lactose intolerance (gi), Lisinopril, Milk-related compounds, Other, Oxycodone hcl, Pneumococcal vaccines, Valsartan, Cozaar [losartan], Digitek [digoxin], Felodipine, Hctz [hydrochlorothiazide], Simvastatin, and Spironolactone  Review of Systems   Review of Systems  Unable to perform ROS: Dementia   Physical Exam Updated Vital Signs Temp (!) 97.5 F (36.4 C) (Oral)   Physical Exam Vitals and nursing note reviewed.  Constitutional:      General: She is not in acute distress.    Appearance: She is well-developed.  HENT:     Head: Normocephalic.     Comments: Left parietal  hematoma, 3 cm, no depression, no bleeding or laceration    Right Ear: External ear normal.     Left Ear: External ear normal.     Nose: Nose normal.  Eyes:     Conjunctiva/sclera: Conjunctivae normal.  Cardiovascular:     Rate and Rhythm: Normal rate. Rhythm irregular.     Heart sounds: No murmur heard. Pulmonary:     Effort: No respiratory distress.     Breath sounds: No wheezing, rhonchi or rales.  Abdominal:     Palpations: Abdomen is soft.     Tenderness: There is no abdominal tenderness. There is no guarding or rebound.  Musculoskeletal:     Cervical back: Normal range of motion and neck supple.     Right lower leg: No edema.     Left lower leg: No edema.     Comments: Left shoulder: There is old ecchymosis noted in the bandage with Vaseline gauze overlying.  Right shoulder: Normal range of motion  Right upper arm: There is skin tear with gauze bandage overlying.  Right elbow: Normal range of motion  Right hand: Mild abrasion to the tips of several fingers.  Right hip: Patient does not localize any pain but does cry out with movement of her leg.  Left hip: Patient is able to flex at the hip without any apparent discomfort.  Skin:    General: Skin is warm and dry.     Findings: No rash.  Neurological:     General: No focal deficit present.     Mental Status: She is alert. Mental status is at baseline.     Motor: No weakness.  Psychiatric:        Mood and Affect: Mood normal.    ED Results / Procedures / Treatments   Labs (all labs ordered are listed, but only abnormal results are displayed) Labs Reviewed  CBG MONITORING, ED - Abnormal; Notable for the following components:      Result Value   Glucose-Capillary 131 (*)    All other components within normal limits  URINALYSIS, ROUTINE W REFLEX MICROSCOPIC    EKG None  Radiology DG Chest 1 View  Result Date: 01/25/2021 CLINICAL DATA:  Status post fall EXAM: CHEST  1 VIEW COMPARISON:  07/13/2020 FINDINGS:  Calcified left mid lung pulmonary nodule likely reflecting sequela prior granulomatous disease. No focal consolidation. No pleural effusion or pneumothorax. Heart and mediastinal contours are unremarkable. No acute osseous abnormality. Old left posterior rib fractures. IMPRESSION: No active disease. Electronically Signed   By: Kathreen Devoid M.D.   On: 01/25/2021 15:59   DG Thoracic Spine 2 View  Result Date: 01/25/2021 CLINICAL DATA:  [place] EXAM: THORACIC SPINE 2 VIEWS COMPARISON:  06/30/2019 FINDINGS: Frontal and lateral views of the thoracic spine are obtained. Chronic compression deformity at T11 again noted. No acute fracture. Stable mild lower thoracic spondylosis. Paraspinal soft tissues are unremarkable. IMPRESSION: 1. No acute fracture. 2. Stable chronic T11 compression fracture. Electronically Signed   By: Randa Ngo M.D.   On: 01/25/2021 16:09   DG Lumbar Spine Complete  Result Date: 01/25/2021 CLINICAL DATA:  [place] EXAM: LUMBAR SPINE - COMPLETE 4+ VIEW COMPARISON:  06/11/2007 FINDINGS: Frontal, bilateral oblique, lateral views of the lumbar spine are obtained. There are 5 non-rib-bearing lumbar type vertebral bodies with left convex scoliosis unchanged. There are no acute displaced fractures. There is diffuse multilevel spondylosis and facet hypertrophy. Sacroiliac joints are unremarkable. IMPRESSION: 1. Spondylosis and left convex scoliosis.  No acute fracture. Electronically Signed   By: Randa Ngo M.D.   On: 01/25/2021 16:06   DG Shoulder Right  Result Date: 01/25/2021 CLINICAL DATA:  [place] EXAM: RIGHT SHOULDER - 2+ VIEW COMPARISON:  None. FINDINGS: Frontal and transscapular views of the right shoulder are obtained. Glenohumeral and acromioclavicular joint osteoarthritis. No fracture, subluxation, or dislocation. Prior healed right rib fractures. Right chest is clear. IMPRESSION: 1. Degenerative changes.  No acute fracture. Electronically Signed   By: Randa Ngo M.D.   On:  01/25/2021 16:08   DG Knee 2 Views Left  Result Date: 01/25/2021 CLINICAL DATA:  [place] EXAM: LEFT KNEE - 1-2 VIEW COMPARISON:  01/15/2021 FINDINGS: Frontal and cross-table lateral views of the left knee are obtained. No acute fractures. The bones are osteopenic. Stable 3 compartmental osteoarthritis greatest laterally. No effusion. IMPRESSION: 1. Stable degenerative changes.  No acute fracture. Electronically Signed   By: Randa Ngo M.D.   On: 01/25/2021 15:59   DG Knee 2 Views Right  Result Date: 01/25/2021 CLINICAL DATA:  [place] EXAM: RIGHT KNEE - 1-2 VIEW COMPARISON:  None. FINDINGS: Frontal and cross-table lateral views of the right knee are obtained. No fracture, subluxation, or dislocation. Bones are osteopenic. Three compartmental osteoarthritis greatest laterally. No effusion. Soft tissues are unremarkable. IMPRESSION: 1. Osteopenia and osteoarthritis.  No acute fracture. Electronically Signed   By: Randa Ngo M.D.   On: 01/25/2021 16:04   CT Head Wo Contrast  Result Date: 01/25/2021 CLINICAL DATA:  Unwitnessed fall. EXAM: CT HEAD WITHOUT CONTRAST TECHNIQUE: Contiguous axial images were obtained from the base of the skull through the vertex without intravenous contrast. COMPARISON:  01/15/2021 FINDINGS: Brain: There is no evidence for acute hemorrhage, hydrocephalus, mass lesion, or abnormal extra-axial fluid collection. No definite CT evidence for acute infarction. Diffuse loss of parenchymal volume is consistent with atrophy. Patchy low attenuation in the deep hemispheric and periventricular white matter is nonspecific, but likely reflects chronic microvascular ischemic demyelination. Vascular: No hyperdense vessel or unexpected calcification. Skull: No evidence for fracture. No worrisome lytic or sclerotic lesion. Sinuses/Orbits:  The visualized paranasal sinuses and mastoid air cells are clear. Visualized portions of the globes and intraorbital fat are unremarkable. Other: None.  IMPRESSION: 1. No acute intracranial abnormality.  Stable since prior. 2. Atrophy with chronic small vessel white matter ischemic disease. Electronically Signed   By: Misty Stanley M.D.   On: 01/25/2021 13:01   CT Cervical Spine Wo Contrast  Result Date: 01/25/2021 CLINICAL DATA:  Unwitnessed fall. EXAM: CT CERVICAL SPINE WITHOUT CONTRAST TECHNIQUE: Multidetector CT imaging of the cervical spine was performed without intravenous contrast. Multiplanar CT image reconstructions were also generated. COMPARISON:  CT cervical spine 01/15/2021 FINDINGS: Alignment: Normal Skull base and vertebrae: Negative for fracture or mass in the cervical spine Healing fracture left medial clavicle unchanged from the prior study. Soft tissues and spinal canal: Atherosclerotic calcification. Negative for mass or adenopathy. Disc levels: Multilevel disc and facet degeneration. Prominent degenerative changes C1-2 with spurring. Upper chest: Lung apices clear bilaterally. Other: None IMPRESSION: Cervical spondylosis. Negative for cervical spine fracture Subacute healing fracture left medial clavicle unchanged from 01/15/2021. Electronically Signed   By: Franchot Gallo M.D.   On: 01/25/2021 13:38   DG Shoulder Left  Result Date: 01/25/2021 CLINICAL DATA:  [place] EXAM: LEFT SHOULDER - 2+ VIEW COMPARISON:  01/15/2021 FINDINGS: Frontal and transscapular views of the left shoulder are obtained. Stable glenohumeral osteoarthritis. No fracture, subluxation, or dislocation. Stable prior healed left rib fractures. IMPRESSION: 1. Stable osteoarthritis.  No acute fracture. Electronically Signed   By: Randa Ngo M.D.   On: 01/25/2021 16:06   DG Hip Unilat W or Wo Pelvis 2-3 Views Left  Result Date: 01/25/2021 CLINICAL DATA:  [place] EXAM: DG HIP (WITH OR WITHOUT PELVIS) 2-3V LEFT COMPARISON:  03/12/2018 FINDINGS: Frontal view of the pelvis as well as frontal and cross-table lateral views of the left hip are obtained. Right hip and knee  arthroplasty is in the expected position. Stable mild left hip osteoarthritis. No fracture, subluxation, or dislocation. The remainder of the bony pelvis is unremarkable. IMPRESSION: 1. No acute displaced fracture. Electronically Signed   By: Randa Ngo M.D.   On: 01/25/2021 16:01    Procedures Procedures   Medications Ordered in ED Medications - No data to display  ED Course  I have reviewed the triage vital signs and the nursing notes.  Pertinent labs & imaging results that were available during my care of the patient were reviewed by me and considered in my medical decision making (see chart for details).  Patient seen and examined.  Patient with extensive imaging done yesterday.  I do feel that she will need repeat CT of her head and neck given her hematoma.  Left shoulder injury appears old and imaging was negative yesterday.  She does have an acute appearing skin tear to the right upper arm and we will x-ray humerus.  Will obtain pelvis films although no definitive hip pain.  We will try to obtain UA as well.  CBC, CMP performed yesterday reviewed.  Vital signs reviewed and are as follows: BP (!) 144/67   Pulse 69   Temp (!) 97.5 F (36.4 C) (Oral)   Resp 10   SpO2 97%   Patient discussed with Dr. Maryan Rued who has seen patient.  Imaging was negative.  Patient stable on reexam.  I called and discussed results with patient's daughter.  They were actually going to visit her today when the facility called and stated she was going back to the emergency department.  Plan for discharge.  MDM Rules/Calculators/A&P                           Patient here after another fall.  Second visit in 2 days.  Imaging today is negative.  She mainly has skin tears and suspected MSK pain.  Head and neck CT negative.   Final Clinical Impression(s) / ED Diagnoses Final diagnoses:  Ecchymosis  Fall  Hematoma  Minor head injury, initial encounter  Skin tear of right upper arm without  complication, initial encounter    Rx / DC Orders ED Discharge Orders     None        Carlisle Cater, PA-C 01/26/21 1509    Blanchie Dessert, MD 01/27/21 1449

## 2021-01-26 NOTE — ED Triage Notes (Signed)
Pt BIB [facility] from [facility] c/o unwitnessed fall this morning. Staff found her on the floor around 10 am. Hematoma noted on L head. L shoulder abrasion& L knee abrasion. No LOC. Hx Afib. Baseline dementia.   BP 108/66 HR 66 98% ra

## 2021-01-26 NOTE — ED Notes (Signed)
MD and PA at bedside suturing the pt.

## 2021-01-26 NOTE — ED Notes (Signed)
Pt found covered in blood. pinpointed pt was bleeding from her L knee. EDP Pearline Cables made aware.

## 2021-01-26 NOTE — ED Notes (Signed)
RN noted soiled linens. Pt and linens changed. Wound on R leg redressed.

## 2021-01-27 ENCOUNTER — Other Ambulatory Visit: Payer: Self-pay

## 2021-01-27 DIAGNOSIS — M25552 Pain in left hip: Secondary | ICD-10-CM | POA: Diagnosis not present

## 2021-01-27 MED ORDER — METOPROLOL TARTRATE 25 MG PO TABS
25.0000 mg | ORAL_TABLET | Freq: Once | ORAL | Status: AC
Start: 2021-01-27 — End: 2021-01-27
  Administered 2021-01-27: 25 mg via ORAL
  Filled 2021-01-27: qty 1

## 2021-01-27 MED ORDER — METOPROLOL TARTRATE 5 MG/5ML IV SOLN
2.5000 mg | Freq: Once | INTRAVENOUS | Status: DC
  Filled 2021-01-27: qty 5

## 2021-01-27 NOTE — ED Notes (Signed)
RN reviewed discharge instructions with pt and PTAR. PTAR verbalized understanding and had no other questions. VSS upon discharge.

## 2021-01-27 NOTE — ED Provider Notes (Signed)
Patient discharged by previous shift and awaiting transport back to facility.  Went into atrial fibrillation with RVR rates of 130.  She is given her home beta-blocker. She likely missed doses of her medication since she was in the ED.  Does have a history of atrial fibrillation chronically.  HR has improved to 80s and 90s on recheck. Appears to be back in sinus rhythm and stable for discharge as previously planned.    Ezequiel Essex, MD 01/27/21 [phone]

## 2021-01-27 NOTE — ED Notes (Signed)
Pt HR 120s. EDP made aware

## 2021-01-27 NOTE — ED Provider Notes (Signed)
Physical Exam  BP (!) 166/88 (BP Location: Left Arm)   Pulse 97   Temp 97.6 F (36.4 C) (Oral)   Resp 15   SpO2 97%   Physical Exam  ED Course/Procedures     .Marland KitchenLaceration Repair  Date/Time: 01/27/2021 12:03 AM Performed by: Emeline Darling, PA-C Authorized by: Emeline Darling, PA-C   Consent:    Consent obtained:  Verbal   Consent given by:  Patient   Risks discussed:  Infection, need for additional repair, pain, poor cosmetic result and poor wound healing   Alternatives discussed:  No treatment and delayed treatment Universal protocol:    Procedure explained and questions answered to patient or proxy's satisfaction: yes     Relevant documents present and verified: yes     Test results available: yes     Imaging studies available: yes     Required blood products, implants, devices, and special equipment available: yes     Site/side marked: yes     Immediately prior to procedure, a time out was called: yes     Patient identity confirmed:  Verbally with patient Anesthesia:    Anesthesia method:  Local infiltration   Local anesthetic:  Lidocaine 2% WITH epi Laceration details:    Location:  Leg   Leg location: left knee.   Length (cm):  3 Exploration:    Hemostasis achieved with:  Direct pressure   Imaging outcome: foreign body not noted     Wound extent: no foreign bodies/material noted, no tendon damage noted and no underlying fracture noted   Treatment:    Area cleansed with:  Saline   Amount of cleaning:  Standard   Irrigation solution:  Sterile saline Skin repair:    Repair method:  Sutures   Suture size:  4-0   Suture material:  Prolene   Suture technique:  Simple interrupted Approximation:    Approximation:  Loose Repair type:    Repair type:  Simple Post-procedure details:    Dressing:  Non-adherent dressing   Procedure completion:  Tolerated well, no immediate complications Comments:     Large portion of skin over the gaping  hemorrhaging portion of the laceration is missing, therefore 3 loose sutures were placed, primarily to obtain hemostasis of the area rather than primary closure of the wound.  MDM   I did not participate in the initial evaluation or treatment plan of this patient, however was called to the bedside for acute event while patient was awaiting discharge and transport to ketorolac back to her facility.  I was called to the bedside by RN and ED tech due to acute hemorrhage from the left knee.  According to ED tech she was walking by the patient's bed and noticed that there was blood running down the side of the bed and off the side of the bed and found the patient to be hemorrhaging from her left knee.  It appears as though the patient initially had a small skin tear there with Steri-Strips in place, never she seems to have pulled her wound open with her hand; her fingernails have significant mount of blood underneath of them.  Pressure dressing, combat gauze, and significant direct pressure were all utilized in attempt to achieve hemostasis without success.  Decision was made to attempt suture repair of the wound which was performed as above.  Patient tolerated the procedure well and hemostasis was attained.  Pressure dressing applied afterwards with normal DP pulse on left foot following administration of pressure  dressing.  This chart was dictated using voice recognition software, Dragon. Despite the best efforts of this provider to proofread and correct errors, errors may still occur which can change documentation meaning.     Emeline Darling, PA-C 01/27/21 0006    Jeanell Sparrow, DO 01/29/21 0740

## 2021-01-29 DIAGNOSIS — S81002A Unspecified open wound, left knee, initial encounter: Secondary | ICD-10-CM | POA: Diagnosis not present

## 2021-01-29 DIAGNOSIS — S41101A Unspecified open wound of right upper arm, initial encounter: Secondary | ICD-10-CM | POA: Diagnosis not present

## 2021-01-29 DIAGNOSIS — S41002A Unspecified open wound of left shoulder, initial encounter: Secondary | ICD-10-CM | POA: Diagnosis not present

## 2021-01-29 DIAGNOSIS — S81802A Unspecified open wound, left lower leg, initial encounter: Secondary | ICD-10-CM | POA: Diagnosis not present

## 2021-01-29 DIAGNOSIS — S81801A Unspecified open wound, right lower leg, initial encounter: Secondary | ICD-10-CM | POA: Diagnosis not present

## 2021-01-29 DIAGNOSIS — L89153 Pressure ulcer of sacral region, stage 3: Secondary | ICD-10-CM | POA: Diagnosis not present

## 2021-01-30 DIAGNOSIS — L239 Allergic contact dermatitis, unspecified cause: Secondary | ICD-10-CM | POA: Diagnosis not present

## 2021-02-05 DIAGNOSIS — S41101A Unspecified open wound of right upper arm, initial encounter: Secondary | ICD-10-CM | POA: Diagnosis not present

## 2021-02-05 DIAGNOSIS — I4891 Unspecified atrial fibrillation: Secondary | ICD-10-CM | POA: Diagnosis not present

## 2021-02-05 DIAGNOSIS — L89153 Pressure ulcer of sacral region, stage 3: Secondary | ICD-10-CM | POA: Diagnosis not present

## 2021-02-05 DIAGNOSIS — I10 Essential (primary) hypertension: Secondary | ICD-10-CM | POA: Diagnosis not present

## 2021-02-05 DIAGNOSIS — S81002A Unspecified open wound, left knee, initial encounter: Secondary | ICD-10-CM | POA: Diagnosis not present

## 2021-02-05 DIAGNOSIS — F0150 Vascular dementia without behavioral disturbance: Secondary | ICD-10-CM | POA: Diagnosis not present

## 2021-02-05 DIAGNOSIS — S81801A Unspecified open wound, right lower leg, initial encounter: Secondary | ICD-10-CM | POA: Diagnosis not present

## 2021-02-05 DIAGNOSIS — M24549 Contracture, unspecified hand: Secondary | ICD-10-CM | POA: Diagnosis not present

## 2021-02-12 DIAGNOSIS — G8929 Other chronic pain: Secondary | ICD-10-CM | POA: Diagnosis not present

## 2021-02-12 DIAGNOSIS — R296 Repeated falls: Secondary | ICD-10-CM | POA: Diagnosis not present

## 2021-02-12 DIAGNOSIS — T148XXA Other injury of unspecified body region, initial encounter: Secondary | ICD-10-CM | POA: Diagnosis not present

## 2021-02-12 DIAGNOSIS — I4891 Unspecified atrial fibrillation: Secondary | ICD-10-CM | POA: Diagnosis not present

## 2021-02-14 DIAGNOSIS — S81002A Unspecified open wound, left knee, initial encounter: Secondary | ICD-10-CM | POA: Diagnosis not present

## 2021-02-14 DIAGNOSIS — L89153 Pressure ulcer of sacral region, stage 3: Secondary | ICD-10-CM | POA: Diagnosis not present

## 2021-02-14 DIAGNOSIS — S81801A Unspecified open wound, right lower leg, initial encounter: Secondary | ICD-10-CM | POA: Diagnosis not present

## 2021-02-14 DIAGNOSIS — S41101A Unspecified open wound of right upper arm, initial encounter: Secondary | ICD-10-CM | POA: Diagnosis not present

## 2021-02-15 DIAGNOSIS — G8929 Other chronic pain: Secondary | ICD-10-CM | POA: Diagnosis not present

## 2021-02-15 DIAGNOSIS — L299 Pruritus, unspecified: Secondary | ICD-10-CM | POA: Diagnosis not present

## 2021-02-15 DIAGNOSIS — H109 Unspecified conjunctivitis: Secondary | ICD-10-CM | POA: Diagnosis not present

## 2021-02-15 DIAGNOSIS — T148XXA Other injury of unspecified body region, initial encounter: Secondary | ICD-10-CM | POA: Diagnosis not present

## 2021-02-19 DIAGNOSIS — S81802A Unspecified open wound, left lower leg, initial encounter: Secondary | ICD-10-CM | POA: Diagnosis not present

## 2021-02-19 DIAGNOSIS — F0390 Unspecified dementia without behavioral disturbance: Secondary | ICD-10-CM | POA: Diagnosis not present

## 2021-02-19 DIAGNOSIS — R5381 Other malaise: Secondary | ICD-10-CM | POA: Diagnosis not present

## 2021-02-19 DIAGNOSIS — S41101A Unspecified open wound of right upper arm, initial encounter: Secondary | ICD-10-CM | POA: Diagnosis not present

## 2021-02-19 DIAGNOSIS — S41112A Laceration without foreign body of left upper arm, initial encounter: Secondary | ICD-10-CM | POA: Diagnosis not present

## 2021-02-19 DIAGNOSIS — S81801A Unspecified open wound, right lower leg, initial encounter: Secondary | ICD-10-CM | POA: Diagnosis not present

## 2021-02-19 DIAGNOSIS — M7918 Myalgia, other site: Secondary | ICD-10-CM | POA: Diagnosis not present

## 2021-02-19 DIAGNOSIS — R296 Repeated falls: Secondary | ICD-10-CM | POA: Diagnosis not present

## 2021-02-19 DIAGNOSIS — L89153 Pressure ulcer of sacral region, stage 3: Secondary | ICD-10-CM | POA: Diagnosis not present

## 2021-02-20 DIAGNOSIS — F424 Excoriation (skin-picking) disorder: Secondary | ICD-10-CM | POA: Diagnosis not present

## 2021-02-20 DIAGNOSIS — E46 Unspecified protein-calorie malnutrition: Secondary | ICD-10-CM | POA: Diagnosis not present

## 2021-02-20 DIAGNOSIS — F330 Major depressive disorder, recurrent, mild: Secondary | ICD-10-CM | POA: Diagnosis not present

## 2021-02-20 DIAGNOSIS — M6249 Contracture of muscle, multiple sites: Secondary | ICD-10-CM | POA: Diagnosis not present

## 2021-02-20 DIAGNOSIS — I4891 Unspecified atrial fibrillation: Secondary | ICD-10-CM | POA: Diagnosis not present

## 2021-02-20 DIAGNOSIS — F03918 Unspecified dementia, unspecified severity, with other behavioral disturbance: Secondary | ICD-10-CM | POA: Diagnosis not present

## 2021-02-20 DIAGNOSIS — B354 Tinea corporis: Secondary | ICD-10-CM | POA: Diagnosis not present

## 2021-02-20 DIAGNOSIS — F5101 Primary insomnia: Secondary | ICD-10-CM | POA: Diagnosis not present

## 2021-02-20 DIAGNOSIS — H109 Unspecified conjunctivitis: Secondary | ICD-10-CM | POA: Diagnosis not present

## 2021-02-20 DIAGNOSIS — G8929 Other chronic pain: Secondary | ICD-10-CM | POA: Diagnosis not present

## 2021-02-20 DIAGNOSIS — M6281 Muscle weakness (generalized): Secondary | ICD-10-CM | POA: Diagnosis not present

## 2021-02-20 DIAGNOSIS — L89159 Pressure ulcer of sacral region, unspecified stage: Secondary | ICD-10-CM | POA: Diagnosis not present

## 2021-02-22 DIAGNOSIS — R110 Nausea: Secondary | ICD-10-CM | POA: Diagnosis not present

## 2021-02-22 DIAGNOSIS — M7918 Myalgia, other site: Secondary | ICD-10-CM | POA: Diagnosis not present

## 2021-02-22 DIAGNOSIS — F0390 Unspecified dementia without behavioral disturbance: Secondary | ICD-10-CM | POA: Diagnosis not present

## 2021-02-22 DIAGNOSIS — R109 Unspecified abdominal pain: Secondary | ICD-10-CM | POA: Diagnosis not present

## 2021-02-22 DIAGNOSIS — K2270 Barrett's esophagus without dysplasia: Secondary | ICD-10-CM | POA: Diagnosis not present

## 2021-02-22 DIAGNOSIS — I10 Essential (primary) hypertension: Secondary | ICD-10-CM | POA: Diagnosis not present

## 2021-02-22 DIAGNOSIS — Z889 Allergy status to unspecified drugs, medicaments and biological substances status: Secondary | ICD-10-CM | POA: Diagnosis not present

## 2021-02-25 DIAGNOSIS — M6249 Contracture of muscle, multiple sites: Secondary | ICD-10-CM | POA: Diagnosis not present

## 2021-02-25 DIAGNOSIS — M6281 Muscle weakness (generalized): Secondary | ICD-10-CM | POA: Diagnosis not present

## 2021-02-25 DIAGNOSIS — B354 Tinea corporis: Secondary | ICD-10-CM | POA: Diagnosis not present

## 2021-02-25 DIAGNOSIS — G8929 Other chronic pain: Secondary | ICD-10-CM | POA: Diagnosis not present

## 2021-02-25 DIAGNOSIS — E46 Unspecified protein-calorie malnutrition: Secondary | ICD-10-CM | POA: Diagnosis not present

## 2021-02-25 DIAGNOSIS — L89159 Pressure ulcer of sacral region, unspecified stage: Secondary | ICD-10-CM | POA: Diagnosis not present

## 2021-02-25 DIAGNOSIS — I4891 Unspecified atrial fibrillation: Secondary | ICD-10-CM | POA: Diagnosis not present

## 2021-02-25 DIAGNOSIS — H109 Unspecified conjunctivitis: Secondary | ICD-10-CM | POA: Diagnosis not present

## 2021-02-26 DIAGNOSIS — F0390 Unspecified dementia without behavioral disturbance: Secondary | ICD-10-CM | POA: Diagnosis not present

## 2021-02-26 DIAGNOSIS — I10 Essential (primary) hypertension: Secondary | ICD-10-CM | POA: Diagnosis not present

## 2021-02-26 DIAGNOSIS — S81802A Unspecified open wound, left lower leg, initial encounter: Secondary | ICD-10-CM | POA: Diagnosis not present

## 2021-02-26 DIAGNOSIS — L89153 Pressure ulcer of sacral region, stage 3: Secondary | ICD-10-CM | POA: Diagnosis not present

## 2021-02-26 DIAGNOSIS — D649 Anemia, unspecified: Secondary | ICD-10-CM | POA: Diagnosis not present

## 2021-02-26 DIAGNOSIS — I4891 Unspecified atrial fibrillation: Secondary | ICD-10-CM | POA: Diagnosis not present

## 2021-02-26 DIAGNOSIS — S41101A Unspecified open wound of right upper arm, initial encounter: Secondary | ICD-10-CM | POA: Diagnosis not present

## 2021-02-26 DIAGNOSIS — M7918 Myalgia, other site: Secondary | ICD-10-CM | POA: Diagnosis not present

## 2021-02-27 DIAGNOSIS — B354 Tinea corporis: Secondary | ICD-10-CM | POA: Diagnosis not present

## 2021-02-27 DIAGNOSIS — G8929 Other chronic pain: Secondary | ICD-10-CM | POA: Diagnosis not present

## 2021-02-27 DIAGNOSIS — E46 Unspecified protein-calorie malnutrition: Secondary | ICD-10-CM | POA: Diagnosis not present

## 2021-02-27 DIAGNOSIS — M6249 Contracture of muscle, multiple sites: Secondary | ICD-10-CM | POA: Diagnosis not present

## 2021-02-27 DIAGNOSIS — H109 Unspecified conjunctivitis: Secondary | ICD-10-CM | POA: Diagnosis not present

## 2021-02-27 DIAGNOSIS — L89159 Pressure ulcer of sacral region, unspecified stage: Secondary | ICD-10-CM | POA: Diagnosis not present

## 2021-02-27 DIAGNOSIS — M6281 Muscle weakness (generalized): Secondary | ICD-10-CM | POA: Diagnosis not present

## 2021-02-27 DIAGNOSIS — I4891 Unspecified atrial fibrillation: Secondary | ICD-10-CM | POA: Diagnosis not present

## 2021-03-01 DIAGNOSIS — M7918 Myalgia, other site: Secondary | ICD-10-CM | POA: Diagnosis not present

## 2021-03-01 DIAGNOSIS — L299 Pruritus, unspecified: Secondary | ICD-10-CM | POA: Diagnosis not present

## 2021-03-04 DIAGNOSIS — L89159 Pressure ulcer of sacral region, unspecified stage: Secondary | ICD-10-CM | POA: Diagnosis not present

## 2021-03-04 DIAGNOSIS — G8929 Other chronic pain: Secondary | ICD-10-CM | POA: Diagnosis not present

## 2021-03-04 DIAGNOSIS — R451 Restlessness and agitation: Secondary | ICD-10-CM | POA: Diagnosis not present

## 2021-03-04 DIAGNOSIS — F03918 Unspecified dementia, unspecified severity, with other behavioral disturbance: Secondary | ICD-10-CM | POA: Diagnosis not present

## 2021-03-04 DIAGNOSIS — E46 Unspecified protein-calorie malnutrition: Secondary | ICD-10-CM | POA: Diagnosis not present

## 2021-03-04 DIAGNOSIS — H109 Unspecified conjunctivitis: Secondary | ICD-10-CM | POA: Diagnosis not present

## 2021-03-04 DIAGNOSIS — F5101 Primary insomnia: Secondary | ICD-10-CM | POA: Diagnosis not present

## 2021-03-04 DIAGNOSIS — I4891 Unspecified atrial fibrillation: Secondary | ICD-10-CM | POA: Diagnosis not present

## 2021-03-04 DIAGNOSIS — B354 Tinea corporis: Secondary | ICD-10-CM | POA: Diagnosis not present

## 2021-03-04 DIAGNOSIS — M6249 Contracture of muscle, multiple sites: Secondary | ICD-10-CM | POA: Diagnosis not present

## 2021-03-04 DIAGNOSIS — F330 Major depressive disorder, recurrent, mild: Secondary | ICD-10-CM | POA: Diagnosis not present

## 2021-03-04 DIAGNOSIS — M6281 Muscle weakness (generalized): Secondary | ICD-10-CM | POA: Diagnosis not present

## 2021-03-05 DIAGNOSIS — L89893 Pressure ulcer of other site, stage 3: Secondary | ICD-10-CM | POA: Diagnosis not present

## 2021-03-05 DIAGNOSIS — S81802A Unspecified open wound, left lower leg, initial encounter: Secondary | ICD-10-CM | POA: Diagnosis not present

## 2021-03-05 DIAGNOSIS — L89153 Pressure ulcer of sacral region, stage 3: Secondary | ICD-10-CM | POA: Diagnosis not present

## 2021-03-06 DIAGNOSIS — I4891 Unspecified atrial fibrillation: Secondary | ICD-10-CM | POA: Diagnosis not present

## 2021-03-06 DIAGNOSIS — B351 Tinea unguium: Secondary | ICD-10-CM | POA: Diagnosis not present

## 2021-03-06 DIAGNOSIS — M6249 Contracture of muscle, multiple sites: Secondary | ICD-10-CM | POA: Diagnosis not present

## 2021-03-06 DIAGNOSIS — L299 Pruritus, unspecified: Secondary | ICD-10-CM | POA: Diagnosis not present

## 2021-03-06 DIAGNOSIS — L89159 Pressure ulcer of sacral region, unspecified stage: Secondary | ICD-10-CM | POA: Diagnosis not present

## 2021-03-06 DIAGNOSIS — E8809 Other disorders of plasma-protein metabolism, not elsewhere classified: Secondary | ICD-10-CM | POA: Diagnosis not present

## 2021-03-06 DIAGNOSIS — H109 Unspecified conjunctivitis: Secondary | ICD-10-CM | POA: Diagnosis not present

## 2021-03-06 DIAGNOSIS — M6281 Muscle weakness (generalized): Secondary | ICD-10-CM | POA: Diagnosis not present

## 2021-03-06 DIAGNOSIS — E46 Unspecified protein-calorie malnutrition: Secondary | ICD-10-CM | POA: Diagnosis not present

## 2021-03-06 DIAGNOSIS — B354 Tinea corporis: Secondary | ICD-10-CM | POA: Diagnosis not present

## 2021-03-06 DIAGNOSIS — R532 Functional quadriplegia: Secondary | ICD-10-CM | POA: Diagnosis not present

## 2021-03-06 DIAGNOSIS — G8929 Other chronic pain: Secondary | ICD-10-CM | POA: Diagnosis not present

## 2021-03-11 DIAGNOSIS — I4891 Unspecified atrial fibrillation: Secondary | ICD-10-CM | POA: Diagnosis not present

## 2021-03-11 DIAGNOSIS — L89159 Pressure ulcer of sacral region, unspecified stage: Secondary | ICD-10-CM | POA: Diagnosis not present

## 2021-03-11 DIAGNOSIS — G8929 Other chronic pain: Secondary | ICD-10-CM | POA: Diagnosis not present

## 2021-03-11 DIAGNOSIS — M6249 Contracture of muscle, multiple sites: Secondary | ICD-10-CM | POA: Diagnosis not present

## 2021-03-11 DIAGNOSIS — H109 Unspecified conjunctivitis: Secondary | ICD-10-CM | POA: Diagnosis not present

## 2021-03-11 DIAGNOSIS — E46 Unspecified protein-calorie malnutrition: Secondary | ICD-10-CM | POA: Diagnosis not present

## 2021-03-11 DIAGNOSIS — B354 Tinea corporis: Secondary | ICD-10-CM | POA: Diagnosis not present

## 2021-03-11 DIAGNOSIS — M6281 Muscle weakness (generalized): Secondary | ICD-10-CM | POA: Diagnosis not present

## 2021-03-12 DIAGNOSIS — L89893 Pressure ulcer of other site, stage 3: Secondary | ICD-10-CM | POA: Diagnosis not present

## 2021-03-12 DIAGNOSIS — L89153 Pressure ulcer of sacral region, stage 3: Secondary | ICD-10-CM | POA: Diagnosis not present

## 2021-03-19 DIAGNOSIS — F0390 Unspecified dementia without behavioral disturbance: Secondary | ICD-10-CM | POA: Diagnosis not present

## 2021-03-19 DIAGNOSIS — U071 COVID-19: Secondary | ICD-10-CM | POA: Diagnosis not present

## 2021-03-19 DIAGNOSIS — L89153 Pressure ulcer of sacral region, stage 3: Secondary | ICD-10-CM | POA: Diagnosis not present

## 2021-03-19 DIAGNOSIS — I10 Essential (primary) hypertension: Secondary | ICD-10-CM | POA: Diagnosis not present

## 2021-03-19 DIAGNOSIS — I4891 Unspecified atrial fibrillation: Secondary | ICD-10-CM | POA: Diagnosis not present

## 2021-04-17 DEATH — deceased

## 2021-04-24 ENCOUNTER — Ambulatory Visit: Payer: Medicare Other | Admitting: Neurology
